# Patient Record
Sex: Female | Born: 1955 | Race: White | Hispanic: No | Marital: Married | Smoking: Former smoker
Health system: Southern US, Community
[De-identification: ages and names within clinical notes are randomized; demographics above are authoritative.]

## PROBLEM LIST (undated history)

## (undated) DIAGNOSIS — K219 Gastro-esophageal reflux disease without esophagitis: Secondary | ICD-10-CM

## (undated) DIAGNOSIS — J449 Chronic obstructive pulmonary disease, unspecified: Secondary | ICD-10-CM

## (undated) DIAGNOSIS — C50919 Malignant neoplasm of unspecified site of unspecified female breast: Secondary | ICD-10-CM

## (undated) DIAGNOSIS — R569 Unspecified convulsions: Secondary | ICD-10-CM

## (undated) DIAGNOSIS — D369 Benign neoplasm, unspecified site: Secondary | ICD-10-CM

## (undated) DIAGNOSIS — I6523 Occlusion and stenosis of bilateral carotid arteries: Secondary | ICD-10-CM

## (undated) DIAGNOSIS — Z79899 Other long term (current) drug therapy: Secondary | ICD-10-CM

## (undated) DIAGNOSIS — M8580 Other specified disorders of bone density and structure, unspecified site: Secondary | ICD-10-CM

## (undated) DIAGNOSIS — I2510 Atherosclerotic heart disease of native coronary artery without angina pectoris: Secondary | ICD-10-CM

## (undated) DIAGNOSIS — F32A Depression, unspecified: Secondary | ICD-10-CM

## (undated) DIAGNOSIS — R0609 Other forms of dyspnea: Secondary | ICD-10-CM

## (undated) DIAGNOSIS — I10 Essential (primary) hypertension: Secondary | ICD-10-CM

## (undated) DIAGNOSIS — R911 Solitary pulmonary nodule: Secondary | ICD-10-CM

## (undated) DIAGNOSIS — Z923 Personal history of irradiation: Secondary | ICD-10-CM

## (undated) DIAGNOSIS — R0600 Dyspnea, unspecified: Secondary | ICD-10-CM

## (undated) DIAGNOSIS — G4733 Obstructive sleep apnea (adult) (pediatric): Secondary | ICD-10-CM

## (undated) DIAGNOSIS — E785 Hyperlipidemia, unspecified: Secondary | ICD-10-CM

## (undated) DIAGNOSIS — J189 Pneumonia, unspecified organism: Secondary | ICD-10-CM

## (undated) DIAGNOSIS — F419 Anxiety disorder, unspecified: Secondary | ICD-10-CM

## (undated) DIAGNOSIS — R519 Headache, unspecified: Secondary | ICD-10-CM

## (undated) DIAGNOSIS — M1990 Unspecified osteoarthritis, unspecified site: Secondary | ICD-10-CM

## (undated) DIAGNOSIS — K760 Fatty (change of) liver, not elsewhere classified: Secondary | ICD-10-CM

## (undated) DIAGNOSIS — G4730 Sleep apnea, unspecified: Secondary | ICD-10-CM

## (undated) DIAGNOSIS — J302 Other seasonal allergic rhinitis: Secondary | ICD-10-CM

## (undated) DIAGNOSIS — M069 Rheumatoid arthritis, unspecified: Secondary | ICD-10-CM

## (undated) DIAGNOSIS — R51 Headache: Secondary | ICD-10-CM

## (undated) DIAGNOSIS — Z7960 Long term (current) use of unspecified immunomodulators and immunosuppressants: Secondary | ICD-10-CM

## (undated) DIAGNOSIS — I219 Acute myocardial infarction, unspecified: Secondary | ICD-10-CM

## (undated) DIAGNOSIS — C801 Malignant (primary) neoplasm, unspecified: Secondary | ICD-10-CM

## (undated) DIAGNOSIS — I38 Endocarditis, valve unspecified: Secondary | ICD-10-CM

## (undated) DIAGNOSIS — M47812 Spondylosis without myelopathy or radiculopathy, cervical region: Secondary | ICD-10-CM

## (undated) DIAGNOSIS — I700 Atherosclerosis of aorta: Secondary | ICD-10-CM

## (undated) HISTORY — PX: APPENDECTOMY: SHX54

## (undated) HISTORY — PX: ROTATOR CUFF REPAIR: SHX139

## (undated) HISTORY — PX: TOE SURGERY: SHX1073

## (undated) HISTORY — PX: ABDOMINAL HYSTERECTOMY: SHX81

## (undated) HISTORY — PX: LOBECTOMY: SHX5089

## (undated) HISTORY — PX: EYE SURGERY: SHX253

## (undated) HISTORY — PX: CARDIAC CATHETERIZATION: SHX172

## (undated) HISTORY — PX: LASIK: SHX215

## (undated) HISTORY — PX: CORONARY ANGIOPLASTY WITH STENT PLACEMENT: SHX49

## (undated) HISTORY — PX: BACK SURGERY: SHX140

---

## 2004-11-30 ENCOUNTER — Ambulatory Visit: Payer: Self-pay

## 2004-12-22 ENCOUNTER — Ambulatory Visit: Payer: Self-pay | Admitting: Internal Medicine

## 2005-02-25 ENCOUNTER — Ambulatory Visit: Payer: Self-pay | Admitting: Unknown Physician Specialty

## 2005-04-01 ENCOUNTER — Emergency Department: Payer: Self-pay | Admitting: Emergency Medicine

## 2006-04-06 ENCOUNTER — Ambulatory Visit: Payer: Self-pay | Admitting: Internal Medicine

## 2007-05-01 ENCOUNTER — Ambulatory Visit: Payer: Self-pay | Admitting: Orthopaedic Surgery

## 2007-06-26 ENCOUNTER — Ambulatory Visit: Payer: Self-pay | Admitting: Anesthesiology

## 2007-06-28 ENCOUNTER — Ambulatory Visit: Payer: Self-pay | Admitting: Internal Medicine

## 2007-07-25 ENCOUNTER — Ambulatory Visit: Payer: Self-pay | Admitting: Anesthesiology

## 2007-08-27 ENCOUNTER — Ambulatory Visit: Payer: Self-pay | Admitting: Anesthesiology

## 2008-01-30 ENCOUNTER — Ambulatory Visit: Payer: Self-pay | Admitting: Internal Medicine

## 2008-09-02 ENCOUNTER — Ambulatory Visit: Payer: Self-pay | Admitting: Internal Medicine

## 2008-12-23 ENCOUNTER — Ambulatory Visit: Payer: Self-pay | Admitting: Internal Medicine

## 2008-12-31 ENCOUNTER — Ambulatory Visit: Payer: Self-pay | Admitting: Internal Medicine

## 2009-01-19 ENCOUNTER — Ambulatory Visit: Payer: Self-pay | Admitting: Unknown Physician Specialty

## 2009-01-28 ENCOUNTER — Ambulatory Visit: Payer: Self-pay | Admitting: Internal Medicine

## 2009-09-03 ENCOUNTER — Ambulatory Visit: Payer: Self-pay | Admitting: Internal Medicine

## 2009-10-02 ENCOUNTER — Ambulatory Visit: Payer: Self-pay | Admitting: Unknown Physician Specialty

## 2009-10-16 ENCOUNTER — Ambulatory Visit: Payer: Self-pay | Admitting: Orthopedic Surgery

## 2009-10-16 ENCOUNTER — Inpatient Hospital Stay: Payer: Self-pay | Admitting: Internal Medicine

## 2009-10-16 DIAGNOSIS — I2510 Atherosclerotic heart disease of native coronary artery without angina pectoris: Secondary | ICD-10-CM

## 2009-10-16 DIAGNOSIS — I2119 ST elevation (STEMI) myocardial infarction involving other coronary artery of inferior wall: Secondary | ICD-10-CM

## 2009-10-16 HISTORY — DX: ST elevation (STEMI) myocardial infarction involving other coronary artery of inferior wall: I21.19

## 2009-10-16 HISTORY — DX: Atherosclerotic heart disease of native coronary artery without angina pectoris: I25.10

## 2009-10-16 HISTORY — PX: CORONARY ANGIOPLASTY WITH STENT PLACEMENT: SHX49

## 2009-12-07 ENCOUNTER — Encounter: Payer: Self-pay | Admitting: Internal Medicine

## 2009-12-11 ENCOUNTER — Encounter: Payer: Self-pay | Admitting: Internal Medicine

## 2010-01-10 ENCOUNTER — Encounter: Payer: Self-pay | Admitting: Internal Medicine

## 2010-03-24 ENCOUNTER — Ambulatory Visit: Payer: Self-pay | Admitting: Orthopedic Surgery

## 2010-04-02 ENCOUNTER — Ambulatory Visit: Payer: Self-pay | Admitting: Orthopedic Surgery

## 2010-10-07 ENCOUNTER — Ambulatory Visit: Payer: Self-pay | Admitting: Internal Medicine

## 2011-07-05 ENCOUNTER — Ambulatory Visit: Payer: Self-pay | Admitting: Physician Assistant

## 2011-08-15 ENCOUNTER — Ambulatory Visit: Payer: Self-pay | Admitting: Internal Medicine

## 2011-10-11 ENCOUNTER — Ambulatory Visit: Payer: Self-pay | Admitting: Internal Medicine

## 2012-07-18 ENCOUNTER — Ambulatory Visit: Payer: Self-pay | Admitting: Podiatry

## 2012-09-12 DIAGNOSIS — R569 Unspecified convulsions: Secondary | ICD-10-CM

## 2012-09-12 HISTORY — DX: Unspecified convulsions: R56.9

## 2012-10-11 ENCOUNTER — Ambulatory Visit: Payer: Self-pay | Admitting: Internal Medicine

## 2012-11-27 ENCOUNTER — Ambulatory Visit: Payer: Self-pay | Admitting: Physical Medicine and Rehabilitation

## 2013-06-12 ENCOUNTER — Ambulatory Visit: Payer: Self-pay | Admitting: Family Medicine

## 2013-06-25 ENCOUNTER — Ambulatory Visit: Payer: Self-pay | Admitting: Internal Medicine

## 2013-07-17 ENCOUNTER — Ambulatory Visit: Payer: Self-pay | Admitting: Urology

## 2013-10-17 ENCOUNTER — Ambulatory Visit: Payer: Self-pay

## 2013-10-23 ENCOUNTER — Ambulatory Visit: Payer: Self-pay

## 2014-01-03 ENCOUNTER — Other Ambulatory Visit: Payer: Self-pay | Admitting: Neurosurgery

## 2014-01-06 ENCOUNTER — Encounter (HOSPITAL_COMMUNITY): Payer: Self-pay | Admitting: Pharmacy Technician

## 2014-01-09 NOTE — Pre-Procedure Instructions (Signed)
Michelle Moore  01/09/2014   Your procedure is scheduled on:  Monday, May 11th   Report to [facility] Admitting at  8:15 AM.   Call this number if you have problems the morning of surgery: [phone]   Remember:   Do not eat food or drink liquids after midnight Sunday.   Take these medicines the morning of surgery with A SIP OF WATER: Zyrtec, Omeprazole.  Please use your Advair .   Do not wear jewelry, make-up or nail polish.  Do not wear lotions, powders, or perfumes. You may wear deodorant.  Do not shave 48 hours prior to surgery.    Do not bring valuables to the hospital.  [facility] is not responsible for any belongings or valuables.               Contacts, dentures or bridgework may not be worn into surgery.  Leave suitcase in the car. After surgery it may be brought to your room.  For patients admitted to the hospital, discharge time is determined by your treatment team.             Name and phone number of your driver:    [facility]  [place]  ---  SPOUSE   Special Instructions: "Preparing for Surgery" instruction sheet.   Please read over the following fact sheets that you were given: Pain Booklet, MRSA Information and Surgical Site Infection Prevention

## 2014-01-10 ENCOUNTER — Encounter (HOSPITAL_COMMUNITY): Payer: Self-pay

## 2014-01-10 ENCOUNTER — Encounter (HOSPITAL_COMMUNITY)
Admission: RE | Admit: 2014-01-10 | Discharge: 2014-01-10 | Disposition: A | Discharge status: Home / Self Care | Payer: BC Managed Care – PPO | Source: Ambulatory Visit | Attending: Neurosurgery | Admitting: Neurosurgery

## 2014-01-10 ENCOUNTER — Other Ambulatory Visit (HOSPITAL_COMMUNITY): Payer: Self-pay

## 2014-01-10 ENCOUNTER — Encounter (HOSPITAL_COMMUNITY)
Admission: RE | Admit: 2014-01-10 | Discharge: 2014-01-10 | Disposition: A | Discharge status: Home / Self Care | Payer: BC Managed Care – PPO | Source: Ambulatory Visit | Attending: Anesthesiology | Admitting: Anesthesiology

## 2014-01-10 DIAGNOSIS — Z01818 Encounter for other preprocedural examination: Secondary | ICD-10-CM | POA: Insufficient documentation

## 2014-01-10 DIAGNOSIS — Z01812 Encounter for preprocedural laboratory examination: Secondary | ICD-10-CM | POA: Insufficient documentation

## 2014-01-10 DIAGNOSIS — Z01810 Encounter for preprocedural cardiovascular examination: Secondary | ICD-10-CM | POA: Insufficient documentation

## 2014-01-10 HISTORY — DX: Gastro-esophageal reflux disease without esophagitis: K21.9

## 2014-01-10 HISTORY — DX: Acute myocardial infarction, unspecified: I21.9

## 2014-01-10 HISTORY — DX: Chronic obstructive pulmonary disease, unspecified: J44.9

## 2014-01-10 HISTORY — DX: Headache: R51

## 2014-01-10 HISTORY — DX: Sleep apnea, unspecified: G47.30

## 2014-01-10 HISTORY — DX: Unspecified osteoarthritis, unspecified site: M19.90

## 2014-01-10 LAB — SURGICAL PCR SCREEN
MRSA, PCR: NEGATIVE
STAPHYLOCOCCUS AUREUS: POSITIVE — AB

## 2014-01-10 LAB — CBC
HCT: 46.6 % — ABNORMAL HIGH (ref 36.0–46.0)
Hemoglobin: 15.6 g/dL — ABNORMAL HIGH (ref 12.0–15.0)
MCH: 31 pg (ref 26.0–34.0)
MCHC: 33.5 g/dL (ref 30.0–36.0)
MCV: 92.6 fL (ref 78.0–100.0)
Platelets: 232 10*3/uL (ref 150–400)
RBC: 5.03 MIL/uL (ref 3.87–5.11)
RDW: 14.6 % (ref 11.5–15.5)
WBC: 11.4 10*3/uL — ABNORMAL HIGH (ref 4.0–10.5)

## 2014-01-10 LAB — BASIC METABOLIC PANEL
BUN: 17 mg/dL (ref 6–23)
CALCIUM: 9 mg/dL (ref 8.4–10.5)
CO2: 25 mEq/L (ref 19–32)
Chloride: 102 mEq/L (ref 96–112)
Creatinine, Ser: 0.64 mg/dL (ref 0.50–1.10)
GFR calc non Af Amer: >90 mL/min (ref >90)
Glucose, Bld: 100 mg/dL — ABNORMAL HIGH (ref 70–99)
POTASSIUM: 4.1 meq/L (ref 3.7–5.3)
Sodium: 140 mEq/L (ref 137–147)

## 2014-01-10 NOTE — Progress Notes (Signed)
Patient had 'MI' in 2011--had cardiac cath @ [facility] performed by Dr. Marykay Lex placed.  She does see him every 6 mths, and is currently symptom/pain free.  Had Stress test @ [facility] in 08/2012 (requested those records.) Her PCP is Suzan Garibaldi @ [facility]--will get a copy of her sleep study results.  Does use the machine @ night and believes the setting is @ 2.5??  DA

## 2014-01-19 MED ORDER — CEFAZOLIN SODIUM-DEXTROSE 2-3 GM-% IV SOLR
2.0000 g | INTRAVENOUS | Status: AC
  Administered 2014-01-20: 2 g via INTRAVENOUS
  Filled 2014-01-19: qty 50

## 2014-01-20 ENCOUNTER — Ambulatory Visit (HOSPITAL_COMMUNITY): Payer: BC Managed Care – PPO

## 2014-01-20 ENCOUNTER — Ambulatory Visit (HOSPITAL_COMMUNITY): Payer: BC Managed Care – PPO | Admitting: Anesthesiology

## 2014-01-20 ENCOUNTER — Ambulatory Visit (HOSPITAL_COMMUNITY)
Admission: RE | Admit: 2014-01-20 | Discharge: 2014-01-21 | Disposition: A | Discharge status: Home / Self Care | Payer: BC Managed Care – PPO | Source: Ambulatory Visit | Attending: Neurosurgery | Admitting: Neurosurgery

## 2014-01-20 ENCOUNTER — Encounter (HOSPITAL_COMMUNITY): Payer: BC Managed Care – PPO | Admitting: Anesthesiology

## 2014-01-20 ENCOUNTER — Encounter (HOSPITAL_COMMUNITY)
Admission: RE | Disposition: A | Discharge status: Home / Self Care | Payer: Self-pay | Source: Ambulatory Visit | Attending: Neurosurgery

## 2014-01-20 ENCOUNTER — Encounter (HOSPITAL_COMMUNITY): Payer: Self-pay | Admitting: Anesthesiology

## 2014-01-20 DIAGNOSIS — M4722 Other spondylosis with radiculopathy, cervical region: Secondary | ICD-10-CM

## 2014-01-20 DIAGNOSIS — M5030 Other cervical disc degeneration, unspecified cervical region: Secondary | ICD-10-CM | POA: Insufficient documentation

## 2014-01-20 DIAGNOSIS — G4730 Sleep apnea, unspecified: Secondary | ICD-10-CM | POA: Insufficient documentation

## 2014-01-20 DIAGNOSIS — M47812 Spondylosis without myelopathy or radiculopathy, cervical region: Secondary | ICD-10-CM | POA: Insufficient documentation

## 2014-01-20 DIAGNOSIS — K219 Gastro-esophageal reflux disease without esophagitis: Secondary | ICD-10-CM | POA: Insufficient documentation

## 2014-01-20 DIAGNOSIS — F17200 Nicotine dependence, unspecified, uncomplicated: Secondary | ICD-10-CM | POA: Insufficient documentation

## 2014-01-20 DIAGNOSIS — M129 Arthropathy, unspecified: Secondary | ICD-10-CM | POA: Insufficient documentation

## 2014-01-20 DIAGNOSIS — J449 Chronic obstructive pulmonary disease, unspecified: Secondary | ICD-10-CM | POA: Insufficient documentation

## 2014-01-20 DIAGNOSIS — I252 Old myocardial infarction: Secondary | ICD-10-CM | POA: Insufficient documentation

## 2014-01-20 DIAGNOSIS — J4489 Other specified chronic obstructive pulmonary disease: Secondary | ICD-10-CM | POA: Insufficient documentation

## 2014-01-20 DIAGNOSIS — Z7982 Long term (current) use of aspirin: Secondary | ICD-10-CM | POA: Insufficient documentation

## 2014-01-20 HISTORY — PX: ANTERIOR CERVICAL DECOMP/DISCECTOMY FUSION: SHX1161

## 2014-01-20 SURGERY — ANTERIOR CERVICAL DECOMPRESSION/DISCECTOMY FUSION 2 LEVELS
Anesthesia: General

## 2014-01-20 MED ORDER — GABAPENTIN 300 MG PO CAPS
300.0000 mg | ORAL_CAPSULE | Freq: Every evening | ORAL | Status: DC | PRN
  Filled 2014-01-20: qty 1

## 2014-01-20 MED ORDER — MENTHOL 3 MG MT LOZG
1.0000 | LOZENGE | OROMUCOSAL | Status: DC | PRN
  Administered 2014-01-20: 3 mg via ORAL
  Filled 2014-01-20: qty 9

## 2014-01-20 MED ORDER — GLYCOPYRROLATE 0.2 MG/ML IJ SOLN
INTRAMUSCULAR | Status: AC
  Filled 2014-01-20: qty 3

## 2014-01-20 MED ORDER — LACTATED RINGERS IV SOLN
INTRAVENOUS | Status: DC | PRN
  Administered 2014-01-20 (×2): via INTRAVENOUS

## 2014-01-20 MED ORDER — ONDANSETRON HCL 4 MG/2ML IJ SOLN: 4.0000 mg | INTRAMUSCULAR | Status: DC | PRN

## 2014-01-20 MED ORDER — LORATADINE 10 MG PO TABS
10.0000 mg | ORAL_TABLET | Freq: Every day | ORAL | Status: DC
Start: 2014-01-21 — End: 2014-01-21
  Filled 2014-01-20: qty 1

## 2014-01-20 MED ORDER — ONDANSETRON HCL 4 MG/2ML IJ SOLN
INTRAMUSCULAR | Status: DC | PRN
  Administered 2014-01-20: 4 mg via INTRAVENOUS

## 2014-01-20 MED ORDER — PANTOPRAZOLE SODIUM 40 MG PO TBEC: 40.0000 mg | DELAYED_RELEASE_TABLET | Freq: Every day | ORAL | Status: DC

## 2014-01-20 MED ORDER — MIDAZOLAM HCL 2 MG/2ML IJ SOLN
INTRAMUSCULAR | Status: AC
  Filled 2014-01-20: qty 2

## 2014-01-20 MED ORDER — HEMOSTATIC AGENTS (NO CHARGE) OPTIME
TOPICAL | Status: DC | PRN
  Administered 2014-01-20: 1 via TOPICAL

## 2014-01-20 MED ORDER — PHENYLEPHRINE 40 MCG/ML (10ML) SYRINGE FOR IV PUSH (FOR BLOOD PRESSURE SUPPORT)
PREFILLED_SYRINGE | INTRAVENOUS | Status: AC
  Filled 2014-01-20: qty 10

## 2014-01-20 MED ORDER — 0.9 % SODIUM CHLORIDE (POUR BTL) OPTIME
TOPICAL | Status: DC | PRN
  Administered 2014-01-20: 1000 mL

## 2014-01-20 MED ORDER — PROPOFOL 10 MG/ML IV BOLUS
INTRAVENOUS | Status: DC | PRN
  Administered 2014-01-20: 200 mg via INTRAVENOUS

## 2014-01-20 MED ORDER — ACETAMINOPHEN 325 MG PO TABS: 650.0000 mg | ORAL_TABLET | ORAL | Status: DC | PRN

## 2014-01-20 MED ORDER — ACETAMINOPHEN 650 MG RE SUPP: 650.0000 mg | RECTAL | Status: DC | PRN

## 2014-01-20 MED ORDER — PHENOL 1.4 % MT LIQD
1.0000 | OROMUCOSAL | Status: DC | PRN
  Administered 2014-01-20: 1 via OROMUCOSAL
  Filled 2014-01-20: qty 177

## 2014-01-20 MED ORDER — ROCURONIUM BROMIDE 100 MG/10ML IV SOLN
INTRAVENOUS | Status: DC | PRN
  Administered 2014-01-20: 50 mg via INTRAVENOUS

## 2014-01-20 MED ORDER — EPHEDRINE SULFATE 50 MG/ML IJ SOLN
INTRAMUSCULAR | Status: DC | PRN
  Administered 2014-01-20: 10 mg via INTRAVENOUS

## 2014-01-20 MED ORDER — LIDOCAINE HCL (CARDIAC) 20 MG/ML IV SOLN
INTRAVENOUS | Status: AC
  Filled 2014-01-20: qty 5

## 2014-01-20 MED ORDER — MONTELUKAST SODIUM 10 MG PO TABS
10.0000 mg | ORAL_TABLET | Freq: Every day | ORAL | Status: DC
  Filled 2014-01-20: qty 1

## 2014-01-20 MED ORDER — PHENYLEPHRINE HCL 10 MG/ML IJ SOLN
INTRAMUSCULAR | Status: DC | PRN
  Administered 2014-01-20: 80 ug via INTRAVENOUS

## 2014-01-20 MED ORDER — CITALOPRAM HYDROBROMIDE 40 MG PO TABS
40.0000 mg | ORAL_TABLET | Freq: Every day | ORAL | Status: DC
  Administered 2014-01-20: 40 mg via ORAL
  Filled 2014-01-20 (×2): qty 1

## 2014-01-20 MED ORDER — MORPHINE SULFATE 2 MG/ML IJ SOLN
1.0000 mg | INTRAMUSCULAR | Status: DC | PRN
  Administered 2014-01-20: 2 mg via INTRAVENOUS
  Filled 2014-01-20: qty 1

## 2014-01-20 MED ORDER — ROCURONIUM BROMIDE 50 MG/5ML IV SOLN
INTRAVENOUS | Status: AC
  Filled 2014-01-20: qty 1

## 2014-01-20 MED ORDER — FENTANYL CITRATE 0.05 MG/ML IJ SOLN
INTRAMUSCULAR | Status: DC | PRN
  Administered 2014-01-20: 250 ug via INTRAVENOUS

## 2014-01-20 MED ORDER — DIAZEPAM 5 MG PO TABS
5.0000 mg | ORAL_TABLET | Freq: Four times a day (QID) | ORAL | Status: DC | PRN
  Administered 2014-01-20: 5 mg via ORAL
  Filled 2014-01-20: qty 1

## 2014-01-20 MED ORDER — HYDROMORPHONE HCL PF 1 MG/ML IJ SOLN
0.2500 mg | INTRAMUSCULAR | Status: DC | PRN
  Administered 2014-01-20 (×2): 0.5 mg via INTRAVENOUS

## 2014-01-20 MED ORDER — OXYCODONE-ACETAMINOPHEN 5-325 MG PO TABS
1.0000 | ORAL_TABLET | ORAL | Status: DC | PRN
  Administered 2014-01-20 – 2014-01-21 (×2): 2 via ORAL
  Filled 2014-01-20 (×2): qty 2

## 2014-01-20 MED ORDER — MIDAZOLAM HCL 5 MG/5ML IJ SOLN
INTRAMUSCULAR | Status: DC | PRN
  Administered 2014-01-20: 2 mg via INTRAVENOUS

## 2014-01-20 MED ORDER — CEFAZOLIN SODIUM-DEXTROSE 2-3 GM-% IV SOLR
2.0000 g | Freq: Three times a day (TID) | INTRAVENOUS | Status: AC
  Administered 2014-01-20 – 2014-01-21 (×2): 2 g via INTRAVENOUS
  Filled 2014-01-20 (×2): qty 50

## 2014-01-20 MED ORDER — DEXAMETHASONE 4 MG PO TABS
4.0000 mg | ORAL_TABLET | Freq: Four times a day (QID) | ORAL | Status: AC
  Administered 2014-01-20 – 2014-01-21 (×3): 4 mg via ORAL
  Filled 2014-01-20 (×3): qty 1

## 2014-01-20 MED ORDER — LACTATED RINGERS IV SOLN
INTRAVENOUS | Status: DC
  Administered 2014-01-20: 17:00:00 via INTRAVENOUS

## 2014-01-20 MED ORDER — NEOSTIGMINE METHYLSULFATE 10 MG/10ML IV SOLN
INTRAVENOUS | Status: AC
  Filled 2014-01-20: qty 1

## 2014-01-20 MED ORDER — HYDROCODONE-ACETAMINOPHEN 5-325 MG PO TABS: 1.0000 | ORAL_TABLET | ORAL | Status: DC | PRN

## 2014-01-20 MED ORDER — EPHEDRINE SULFATE 50 MG/ML IJ SOLN
INTRAMUSCULAR | Status: AC
  Filled 2014-01-20: qty 1

## 2014-01-20 MED ORDER — THROMBIN 5000 UNITS EX SOLR
CUTANEOUS | Status: DC | PRN
  Administered 2014-01-20 (×2): 5000 [IU] via TOPICAL

## 2014-01-20 MED ORDER — HYDROMORPHONE HCL PF 1 MG/ML IJ SOLN
INTRAMUSCULAR | Status: AC
  Administered 2014-01-20: 0.5 mg via INTRAVENOUS
  Filled 2014-01-20: qty 1

## 2014-01-20 MED ORDER — LIDOCAINE HCL (CARDIAC) 20 MG/ML IV SOLN
INTRAVENOUS | Status: DC | PRN
  Administered 2014-01-20: 50 mg via INTRAVENOUS

## 2014-01-20 MED ORDER — MOMETASONE FURO-FORMOTEROL FUM 100-5 MCG/ACT IN AERO
2.0000 | INHALATION_SPRAY | Freq: Two times a day (BID) | RESPIRATORY_TRACT | Status: DC
  Administered 2014-01-20 – 2014-01-21 (×2): 2 via RESPIRATORY_TRACT
  Filled 2014-01-20: qty 8.8

## 2014-01-20 MED ORDER — BACITRACIN ZINC 500 UNIT/GM EX OINT
TOPICAL_OINTMENT | CUTANEOUS | Status: DC | PRN
  Administered 2014-01-20: 1 via TOPICAL

## 2014-01-20 MED ORDER — SODIUM CHLORIDE 0.9 % IR SOLN
Status: DC | PRN
  Administered 2014-01-20: 12:00:00

## 2014-01-20 MED ORDER — NEOSTIGMINE METHYLSULFATE 10 MG/10ML IV SOLN
INTRAVENOUS | Status: DC | PRN
  Administered 2014-01-20: 4 mg via INTRAVENOUS

## 2014-01-20 MED ORDER — DEXAMETHASONE SODIUM PHOSPHATE 4 MG/ML IJ SOLN: 4.0000 mg | Freq: Four times a day (QID) | INTRAMUSCULAR | Status: AC

## 2014-01-20 MED ORDER — FENTANYL CITRATE 0.05 MG/ML IJ SOLN
INTRAMUSCULAR | Status: AC
  Filled 2014-01-20: qty 5

## 2014-01-20 MED ORDER — DOCUSATE SODIUM 100 MG PO CAPS
100.0000 mg | ORAL_CAPSULE | Freq: Two times a day (BID) | ORAL | Status: DC
  Administered 2014-01-20: 100 mg via ORAL
  Filled 2014-01-20 (×3): qty 1

## 2014-01-20 MED ORDER — PROPOFOL 10 MG/ML IV BOLUS
INTRAVENOUS | Status: AC
Start: 2014-01-20 — End: 2014-01-20
  Filled 2014-01-20: qty 20

## 2014-01-20 MED ORDER — BUPIVACAINE-EPINEPHRINE (PF) 0.5% -1:200000 IJ SOLN
INTRAMUSCULAR | Status: DC | PRN
  Administered 2014-01-20: 10 mL

## 2014-01-20 MED ORDER — ALUM & MAG HYDROXIDE-SIMETH 200-200-20 MG/5ML PO SUSP: 30.0000 mL | Freq: Four times a day (QID) | ORAL | Status: DC | PRN

## 2014-01-20 MED ORDER — LACTATED RINGERS IV SOLN
INTRAVENOUS | Status: DC
  Administered 2014-01-20: 10:00:00 via INTRAVENOUS

## 2014-01-20 MED ORDER — GLYCOPYRROLATE 0.2 MG/ML IJ SOLN
INTRAMUSCULAR | Status: DC | PRN
  Administered 2014-01-20: .7 mg via INTRAVENOUS

## 2014-01-20 MED ORDER — SIMVASTATIN 40 MG PO TABS
40.0000 mg | ORAL_TABLET | Freq: Every day | ORAL | Status: DC
  Administered 2014-01-20: 40 mg via ORAL
  Filled 2014-01-20 (×2): qty 1

## 2014-01-20 SURGICAL SUPPLY — 63 items
BAG DECANTER FOR FLEXI CONT (MISCELLANEOUS) ×2 IMPLANT
BENZOIN TINCTURE PRP APPL 2/3 (GAUZE/BANDAGES/DRESSINGS) ×2 IMPLANT
BIT DRILL NEURO 2X3.1 SFT TUCH (MISCELLANEOUS) ×1 IMPLANT
BLADE 10 SAFETY STRL DISP (BLADE) ×2 IMPLANT
BLADE SURG 15 STRL LF DISP TIS (BLADE) ×1 IMPLANT
BLADE SURG 15 STRL SS (BLADE) ×1
BLADE ULTRA TIP 2M (BLADE) ×2 IMPLANT
BRUSH SCRUB EZ PLAIN DRY (MISCELLANEOUS) ×2 IMPLANT
BUR BARREL STRAIGHT FLUTE 4.0 (BURR) ×2 IMPLANT
BUR MATCHSTICK NEURO 3.0 LAGG (BURR) ×2 IMPLANT
CANISTER SUCT 3000ML (MISCELLANEOUS) ×2 IMPLANT
CONT SPEC 4OZ CLIKSEAL STRL BL (MISCELLANEOUS) ×2 IMPLANT
COVER MAYO STAND STRL (DRAPES) ×2 IMPLANT
DEVICE FUSION VIST S 14X14X6MM (Trauma) ×2 IMPLANT
DRAPE LAPAROTOMY 100X72 PEDS (DRAPES) ×2 IMPLANT
DRAPE MICROSCOPE LEICA (MISCELLANEOUS) IMPLANT
DRAPE POUCH INSTRU U-SHP 10X18 (DRAPES) ×2 IMPLANT
DRAPE SURG 17X23 STRL (DRAPES) ×4 IMPLANT
DRILL NEURO 2X3.1 SOFT TOUCH (MISCELLANEOUS) ×2
ELECT REM PT RETURN 9FT ADLT (ELECTROSURGICAL) ×2
ELECTRODE REM PT RTRN 9FT ADLT (ELECTROSURGICAL) ×1 IMPLANT
GAUZE SPONGE 4X4 16PLY XRAY LF (GAUZE/BANDAGES/DRESSINGS) IMPLANT
GLOVE BIO SURGEON STRL SZ8.5 (GLOVE) ×2 IMPLANT
GLOVE BIOGEL PI IND STRL 7.5 (GLOVE) ×1 IMPLANT
GLOVE BIOGEL PI IND STRL 8 (GLOVE) ×1 IMPLANT
GLOVE BIOGEL PI INDICATOR 7.5 (GLOVE) ×1
GLOVE BIOGEL PI INDICATOR 8 (GLOVE) ×1
GLOVE ECLIPSE 7.5 STRL STRAW (GLOVE) ×4 IMPLANT
GLOVE EXAM NITRILE LRG STRL (GLOVE) IMPLANT
GLOVE EXAM NITRILE MD LF STRL (GLOVE) IMPLANT
GLOVE EXAM NITRILE XL STR (GLOVE) IMPLANT
GLOVE EXAM NITRILE XS STR PU (GLOVE) IMPLANT
GLOVE SS BIOGEL STRL SZ 8 (GLOVE) ×1 IMPLANT
GLOVE SUPERSENSE BIOGEL SZ 8 (GLOVE) ×1
GOWN BRE IMP SLV AUR LG STRL (GOWN DISPOSABLE) IMPLANT
GOWN BRE IMP SLV AUR XL STRL (GOWN DISPOSABLE) IMPLANT
GOWN STRL REUS W/ TWL XL LVL3 (GOWN DISPOSABLE) ×3 IMPLANT
GOWN STRL REUS W/TWL XL LVL3 (GOWN DISPOSABLE) ×3
KIT BASIN OR (CUSTOM PROCEDURE TRAY) ×2 IMPLANT
KIT ROOM TURNOVER OR (KITS) ×2 IMPLANT
MARKER SKIN DUAL TIP RULER LAB (MISCELLANEOUS) ×2 IMPLANT
NEEDLE HYPO 22GX1.5 SAFETY (NEEDLE) ×2 IMPLANT
NEEDLE SPNL 18GX3.5 QUINCKE PK (NEEDLE) ×2 IMPLANT
NS IRRIG 1000ML POUR BTL (IV SOLUTION) ×2 IMPLANT
PACK LAMINECTOMY NEURO (CUSTOM PROCEDURE TRAY) ×2 IMPLANT
PATTIES SURGICAL .5 X.5 (GAUZE/BANDAGES/DRESSINGS) ×2 IMPLANT
PIN DISTRACTION 14MM (PIN) ×4 IMPLANT
PLATE ANT CERV XTEND 2 LV 26 (Plate) ×2 IMPLANT
PUTTY 5ML ACTIFUSE ABX (Putty) ×2 IMPLANT
RUBBERBAND STERILE (MISCELLANEOUS) IMPLANT
SCREW XTD VAR 4.2 SELF TAP 12 (Screw) ×12 IMPLANT
SPONGE GAUZE 4X4 12PLY (GAUZE/BANDAGES/DRESSINGS) ×2 IMPLANT
SPONGE INTESTINAL PEANUT (DISPOSABLE) ×4 IMPLANT
SPONGE SURGIFOAM ABS GEL SZ50 (HEMOSTASIS) ×2 IMPLANT
STRIP CLOSURE SKIN 1/2X4 (GAUZE/BANDAGES/DRESSINGS) ×2 IMPLANT
SUT VIC AB 0 CT1 27 (SUTURE) ×1
SUT VIC AB 0 CT1 27XBRD ANTBC (SUTURE) ×1 IMPLANT
SUT VIC AB 3-0 SH 8-18 (SUTURE) ×2 IMPLANT
SYR 20ML ECCENTRIC (SYRINGE) ×2 IMPLANT
TOWEL OR 17X24 6PK STRL BLUE (TOWEL DISPOSABLE) ×2 IMPLANT
TOWEL OR 17X26 10 PK STRL BLUE (TOWEL DISPOSABLE) ×2 IMPLANT
VISTA S O 14X14X6MM (Trauma) ×4 IMPLANT
WATER STERILE IRR 1000ML POUR (IV SOLUTION) ×2 IMPLANT

## 2014-01-20 NOTE — Progress Notes (Signed)
Patient ID: Michelle Moore, female   DOB: 06/27/1956, 58 y.o.   MRN: [id number] Subjective:  The patient is alert and pleasant. Her neck is appropriately sore.  Objective: Vital signs in last 24 hours: Temp:  [97.6 F (36.4 C)-98.5 F (36.9 C)] 97.6 F (36.4 C) (05/11 1705) Pulse Rate:  [64-86] 65 (05/11 1705) Resp:  [13-27] 18 (05/11 1705) BP: (104-117)/(51-70) 104/67 mmHg (05/11 1705) SpO2:  [93 %-98 %] 93 % (05/11 1705) Weight:  [65.772 kg (145 lb)] 65.772 kg (145 lb) (05/11 0824)  Intake/Output from previous day:   Intake/Output this shift: Total I/O In: 1750 [I.V.:1750] Out: -   Physical exam the patient is alert and pleasant. She is moving all 4 extremities well. Her dressing is clean and dry. There is no evidence of hematoma or shift.  Lab Results: No results found for this basename: WBC, HGB, HCT, PLT,  in the last 72 hours BMET No results found for this basename: NA, K, CL, CO2, GLUCOSE, BUN, CREATININE, CALCIUM,  in the last 72 hours  Studies/Results: Dg Cervical Spine 2-3 Views  01/20/2014   CLINICAL DATA:  ACDF.  Spinal fusion.  EXAM: CERVICAL SPINE - 2-3 VIEW  COMPARISON:  None.  FINDINGS: 2 lateral radiographs in during cervical spine operation are submitted for interpretation. These demonstrate a localization images and subsequent placement of what appears to be a C5 through C7 ACDF device. Expected postsurgical changes in the anterior soft tissues.  IMPRESSION: C5 through C7 ACDF.   Electronically Signed   By: Dereck Ligas M.D.   On: 01/20/2014 14:07    Assessment/Plan: The patient is doing well. She will likely go home tomorrow.  LOS: 0 days     Ophelia Charter 01/20/2014, 5:33 PM

## 2014-01-20 NOTE — Progress Notes (Signed)
Pt set up on CPap for the night at a pressure of 14. Will monitor

## 2014-01-20 NOTE — Anesthesia Preprocedure Evaluation (Signed)
Anesthesia Evaluation   Patient awake    Reviewed: Allergy & Precautions, H&P , NPO status , Patient's Chart, lab work & pertinent test results  Airway       Dental   Pulmonary sleep apnea , COPDCurrent Smoker,          Cardiovascular + Past MI     Neuro/Psych    GI/Hepatic Neg liver ROS, GERD-  ,  Endo/Other  negative endocrine ROS  Renal/GU negative Renal ROS     Musculoskeletal   Abdominal   Peds  Hematology   Anesthesia Other Findings   Reproductive/Obstetrics                           Anesthesia Physical Anesthesia Plan  ASA: III  Anesthesia Plan: General   Post-op Pain Management:    Induction: Intravenous  Airway Management Planned: Oral ETT  Additional Equipment:   Intra-op Plan:   Post-operative Plan: Extubation in OR  Informed Consent:   Plan Discussed with: CRNA and Anesthesiologist  Anesthesia Plan Comments:         Anesthesia Quick Evaluation

## 2014-01-20 NOTE — Progress Notes (Signed)
Orthopedic Tech Progress Note Patient Details:  Michelle Moore [birth year] [id number] Spoke with nurse; patient has aspen collar already. No action needed from Ortho. Patient ID: Michelle Moore, female   DOB: 09/24/1955, 58 y.o.   MRN: [id number]   [name] 01/20/2014, 3:33 PM

## 2014-01-20 NOTE — Anesthesia Procedure Notes (Signed)
Procedure Name: Intubation Date/Time: 01/20/2014 11:20 AM Performed by: Eligha Bridegroom Pre-anesthesia Checklist: Patient identified, Timeout performed, Emergency Drugs available, Suction available and Patient being monitored Patient Re-evaluated:Patient Re-evaluated prior to inductionOxygen Delivery Method: Circle system utilized Preoxygenation: Pre-oxygenation with 100% oxygen Intubation Type: IV induction Ventilation: Mask ventilation without difficulty and Oral airway inserted - appropriate to patient size Laryngoscope Size: Mac and 3 Grade View: Grade III Tube type: Oral Tube size: 7.0 mm Airway Equipment and Method: Stylet and Video-laryngoscopy Placement Confirmation: ETT inserted through vocal cords under direct vision,  breath sounds checked- equal and bilateral and positive ETCO2 Secured at: 22 cm Tube secured with: Tape Dental Injury: Teeth and Oropharynx as per pre-operative assessment  Difficulty Due To: Difficult Airway- due to anterior larynx

## 2014-01-20 NOTE — Transfer of Care (Signed)
Immediate Anesthesia Transfer of Care Note  Patient: Michelle Moore  Procedure(s) Performed: Procedure(s): ANTERIOR CERVICAL DECOMPRESSION/DISCECTOMY FUSION 2 LEVELS cervical five/six six Tarry Kos (N/A)  Patient Location: PACU  Anesthesia Type:General  Level of Consciousness: awake and alert   Airway & Oxygen Therapy: Patient Spontanous Breathing and Patient connected to nasal cannula oxygen  Post-op Assessment: Report given to PACU RN and Post -op Vital signs reviewed and stable  Post vital signs: Reviewed and stable  Complications: No apparent anesthesia complications

## 2014-01-20 NOTE — Plan of Care (Signed)
Problem: Consults Goal: Diagnosis - Spinal Surgery Outcome: Completed/Met Date Met:  01/20/14 Cervical Spine Fusion

## 2014-01-20 NOTE — Op Note (Signed)
Brief history: The patient is a 58 year old white female who has complained of neck and arm pain consistent with a cervical radiculopathy. She has failed medical management and was worked up with a cervical MRI. This demonstrated this degeneration, spondylosis, stenosis, etc. at C5-6 and C6-7. I discussed the various treatment option with the patient including surgery. She has weighed the risks, benefits, and alternatives surgery and decided proceed with a C5-C6 and C6-7 anterior cervical discectomy, fusion, and plating.  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 Earle Gell  Asst.: Dr. Jovita Gamma  Anesthesia: Gen. endotracheal  Estimated blood loss: 100 cc  Drains: None  Complications: None  Description of procedure: The patient was brought to the operating room by the anesthesia team. General endotracheal anesthesia was induced. A roll was placed under the patient's shoulders to keep the neck in the neutral position. The patient's anterior cervical region was then prepared with Betadine scrub and Betadine solution. Sterile drapes were applied.  The area to be incised was then injected with Marcaine with epinephrine solution. I then used a scalpel to make a transverse incision in the patient's left anterior neck. I used the Metzenbaum scissors to divide the platysmal muscle and then to dissect medial to the sternocleidomastoid muscle, jugular vein, and carotid artery. I carefully dissected down towards the anterior cervical spine identifying the  esophagus and retracting it medially. Then using Kitner swabs to clear soft tissue from the anterior cervical spine. We then inserted a bent spinal needle into the upper exposed intervertebral disc space. We then obtained intraoperative radiographs confirm our location.  I then used electrocautery to detach the medial border of the longus colli muscle bilaterally from the C5-6 and C6-7 intervertebral disc spaces. I then inserted the Caspar self-retaining retractor underneath the longus colli muscle bilaterally to provide exposure.  We then incised the intervertebral disc at C6-7. We then performed a partial intervertebral discectomy with a pituitary forceps and the Karlin curettes. I then inserted distraction screws into the vertebral bodies at C6-7. We then distracted the interspace. We then used the high-speed drill to decorticate the vertebral endplates at H0-8, to drill away the remainder of the intervertebral disc, to drill away some posterior spondylosis, and to thin out the posterior longitudinal ligament. I then incised ligament with the arachnoid knife. We then removed the ligament with a Kerrison punches undercutting the vertebral endplates and decompressing the thecal sac. We then performed foraminotomies about the bilateral C7 nerve roots. This completed the decompression at this level.  We then repeated this procedure in an analogous fashion at C5-C6 decompressing the thecal sac and the bilateral C6 nerve roots.  We now turned our to attention to the interbody fusion. We used the trial spacers to determine the appropriate size for the interbody prosthesis. We then pre-filled prosthesis with a combination of local morcellized autograft bone that we obtained during decompression as well as Actifuse bone graft extender. We then inserted the prosthesis into the distracted interspace at C5-6 and C6-7. We then removed the distraction screws. There was a good snug fit of the prosthesis in the  interspace.  Having completed the fusion we now turned attention to  the anterior spinal instrumentation. We used the high-speed drill to drill away some anterior spondylosis at the disc spaces so that the plate lay down flat. We selected the appropriate length titanium anterior cervical plate. We laid it along the anterior aspect of the vertebral bodies from C5-C7. We then drilled 12 mm holes at C5, C6 and C7. We then secured the plate to the vertebral bodies by placing two 12 mm self-tapping screws at C5, C6 and C7. We then obtained intraoperative radiograph. The demonstrating good position of the instrumentation. We therefore secured the screws the plate the locking each cam. This completed the instrumentation.  We then obtained hemostasis using bipolar electrocautery. We irrigated the wound out with bacitracin solution. We then removed the retractor. We inspected the esophagus for any damage. There was none apparent. We then reapproximated patient's platysmal muscle with interrupted 3-0 Vicryl suture. We then reapproximated the subcutaneous tissue with interrupted 3-0 Vicryl suture. The skin was reapproximated with Steri-Strips and benzoin. The wound was then covered with bacitracin ointment. A sterile dressing was applied. The drapes were removed. Patient was subsequently extubated by the anesthesia team and transported to the post anesthesia care unit in stable condition. All sponge instrument and needle counts were reportedly correct at the end of this case.

## 2014-01-20 NOTE — Anesthesia Postprocedure Evaluation (Signed)
  Anesthesia Post-op Note  Patient: Michelle Moore  Procedure(s) Performed: Procedure(s): ANTERIOR CERVICAL DECOMPRESSION/DISCECTOMY FUSION 2 LEVELS cervical five/six six Tarry Kos (N/A)  Patient Location: PACU  Anesthesia Type:General  Level of Consciousness: awake  Airway and Oxygen Therapy: Patient Spontanous Breathing  Post-op Pain: mild  Post-op Assessment: Post-op Vital signs reviewed  Post-op Vital Signs: Reviewed  Last Vitals:  Filed Vitals:   01/20/14 1524  BP: 106/70  Pulse: 71  Temp: 36.9 C  Resp: 18    Complications: No apparent anesthesia complications

## 2014-01-20 NOTE — Progress Notes (Signed)
Subjective:  The patient is alert and pleasant. She is in no apparent distress.  Objective: Vital signs in last 24 hours: Temp:  [97.8 F (36.6 C)] 97.8 F (36.6 C) (05/11 1400) Pulse Rate:  [64-86] 77 (05/11 1415) Resp:  [19-27] 19 (05/11 1415) BP: (113-117)/(51-55) 117/55 mmHg (05/11 1415) SpO2:  [95 %-98 %] 98 % (05/11 1415) Weight:  [65.772 kg (145 lb)] 65.772 kg (145 lb) (05/11 0824)  Intake/Output from previous day:   Intake/Output this shift: Total I/O In: 1750 [I.V.:1750] Out: -   Physical exam the patient is alert and pleasant. She moving all 4 extremities well. Her dressing is clean and dry. There is no evidence of hematoma or shift.  Lab Results: No results found for this basename: WBC, HGB, HCT, PLT,  in the last 72 hours BMET No results found for this basename: NA, K, CL, CO2, GLUCOSE, BUN, CREATININE, CALCIUM,  in the last 72 hours  Studies/Results: Dg Cervical Spine 2-3 Views  01/20/2014   CLINICAL DATA:  ACDF.  Spinal fusion.  EXAM: CERVICAL SPINE - 2-3 VIEW  COMPARISON:  None.  FINDINGS: 2 lateral radiographs in during cervical spine operation are submitted for interpretation. These demonstrate a localization images and subsequent placement of what appears to be a C5 through C7 ACDF device. Expected postsurgical changes in the anterior soft tissues.  IMPRESSION: C5 through C7 ACDF.   Electronically Signed   By: Dereck Ligas M.D.   On: 01/20/2014 14:07    Assessment/Plan: The patient is doing well.  LOS: 0 days     Ophelia Charter 01/20/2014, 2:23 PM

## 2014-01-20 NOTE — H&P (Signed)
Subjective: The patient is a 58 year old white female who has complained of neck and arm pain consistent with a cervical radiculopathy. She has failed medical management and was worked up with a cervical MRI. This demonstrated is disc degeneration and spondylosis at C5-6 and C6-7. I discussed the various treatment option with the patient including surgery. She has weighed the risks, benefits, and alternatives surgery and decided proceed with an anterior cervical discectomy, fusion, and plating.   Past Medical History  Diagnosis Date  . Myocardial infarction     in 2011  no damage  . COPD (chronic obstructive pulmonary disease)   . GERD (gastroesophageal reflux disease)     "sometimes"  . Headache(784.0)     sinus headaches  . Arthritis   . Sleep apnea     Past Surgical History  Procedure Laterality Date  . Rotator cuff repair      left shoulder  . Toe surgery Right     has pin and plate in it  . Abdominal hysterectomy    . Cardiac catheterization      2011  . Eye surgery      lasik    Not on File  History  Substance Use Topics  . Smoking status: Current Every Day Smoker -- 1.00 packs/day for 30 years    Types: Cigarettes  . Smokeless tobacco: Not on file  . Alcohol Use: Yes     Comment: occasional    No family history on file. Prior to Admission medications   Medication Sig Start Date End Date Taking? Authorizing Provider  aspirin EC 81 MG tablet Take 81 mg by mouth at bedtime.   Yes Historical Provider, MD  Aspirin-Acetaminophen-Caffeine (EXCEDRIN EXTRA STRENGTH PO) Take 2 tablets by mouth daily as needed (for arm and neck pain).   Yes Historical Provider, MD  cetirizine (ZYRTEC) 10 MG tablet Take 10 mg by mouth daily.   Yes Historical Provider, MD  citalopram (CELEXA) 40 MG tablet Take 40 mg by mouth at bedtime.   Yes Historical Provider, MD  Fluticasone-Salmeterol (ADVAIR) 250-50 MCG/DOSE AEPB Inhale 1 puff into the lungs 2 (two) times daily.   Yes Historical Provider,  MD  gabapentin (NEURONTIN) 300 MG capsule Take 300 mg by mouth as needed (she usually just takes it at night).   Yes Historical Provider, MD  montelukast (SINGULAIR) 10 MG tablet Take 10 mg by mouth at bedtime.   Yes Historical Provider, MD  omeprazole (PRILOSEC) 20 MG capsule Take 20 mg by mouth daily.   Yes Historical Provider, MD  simvastatin (ZOCOR) 40 MG tablet Take 40 mg by mouth at bedtime.   Yes Historical Provider, MD  traMADol (ULTRAM) 50 MG tablet Take 50 mg by mouth 3 (three) times daily.   Yes Historical Provider, MD     Review of Systems  Positive ROS: As above  All other systems have been reviewed and were otherwise negative with the exception of those mentioned in the HPI and as above.  Objective: Vital signs in last 24 hours: Temp:  [97.8 F (36.6 C)] 97.8 F (36.6 C) (05/11 0825) Pulse Rate:  [64] 64 (05/11 0824) Resp:  [20] 20 (05/11 0824) BP: (114)/(53) 114/53 mmHg (05/11 0824) SpO2:  [95 %] 95 % (05/11 0824) Weight:  [65.772 kg (145 lb)] 65.772 kg (145 lb) (05/11 0824)  General Appearance: Alert, cooperative, no distress, Head: Normocephalic, without obvious abnormality, atraumatic Eyes: PERRL, conjunctiva/corneas clear, EOM's intact,    Ears: Normal  Throat: Normal  Neck: Supple,  symmetrical, trachea midline, no adenopathy; thyroid: No enlargement/tenderness/nodules; no carotid bruit or JVD Back: Symmetric, no curvature, ROM normal, no CVA tenderness Lungs: Clear to auscultation bilaterally, respirations unlabored Heart: Regular rate and rhythm, no murmur, rub or gallop Abdomen: Soft, non-tender,, no masses, no organomegaly Extremities: Extremities normal, atraumatic, no cyanosis or edema Pulses: 2+ and symmetric all extremities Skin: Skin color, texture, turgor normal, no rashes or lesions  NEUROLOGIC:   Mental status: alert and oriented, no aphasia, good attention span, Fund of knowledge/ memory ok Motor Exam - grossly normal Sensory Exam - grossly  normal Reflexes:  Coordination - grossly normal Gait - grossly normal Balance - grossly normal Cranial Nerves: I: smell Not tested  II: visual acuity  OS: Normal  OD: Normal   II: visual fields Full to confrontation  II: pupils Equal, round, reactive to light  III,VII: ptosis None  III,IV,VI: extraocular muscles  Full ROM  V: mastication Normal  V: facial light touch sensation  Normal  V,VII: corneal reflex  Present  VII: facial muscle function - upper  Normal  VII: facial muscle function - lower Normal  VIII: hearing Not tested  IX: soft palate elevation  Normal  IX,X: gag reflex Present  XI: trapezius strength  5/5  XI: sternocleidomastoid strength 5/5  XI: neck flexion strength  5/5  XII: tongue strength  Normal    Data Review Lab Results  Component Value Date   WBC 11.4* 01/10/2014   HGB 15.6* 01/10/2014   HCT 46.6* 01/10/2014   MCV 92.6 01/10/2014   PLT 232 01/10/2014   Lab Results  Component Value Date   NA 140 01/10/2014   K 4.1 01/10/2014   CL 102 01/10/2014   CO2 25 01/10/2014   BUN 17 01/10/2014   CREATININE 0.64 01/10/2014   GLUCOSE 100* 01/10/2014   No results found for this basename: INR, PROTIME    Assessment/Plan: C5-C6 and C6-7 disc degeneration, spondylosis, stenosis, cervicalgia, cervical radiculopathy: I discussed the situation with the patient. I reviewed her MR scan with her and pointed out the abnormalities. We have discussed the various treatment options including surgery. I described the surgical treatment option of a C5-C6 and C6-7 anterior cervical discectomy, fusion, and plating. I've shown her surgical models. We have discussed the risks, benefits, alternatives, and likelihood of achieving our goals with surgery. I have answered all the patient's questions. She has decided to proceed with surgery.   Ophelia Charter 01/20/2014 10:14 AM

## 2014-01-21 ENCOUNTER — Encounter (HOSPITAL_COMMUNITY): Payer: Self-pay | Admitting: Neurosurgery

## 2014-01-21 MED ORDER — DIAZEPAM 5 MG PO TABS: 5.0000 mg | ORAL_TABLET | Freq: Four times a day (QID) | ORAL | Status: DC | PRN

## 2014-01-21 MED ORDER — DSS 100 MG PO CAPS: 100.0000 mg | ORAL_CAPSULE | Freq: Two times a day (BID) | ORAL | Status: DC

## 2014-01-21 MED ORDER — OXYCODONE-ACETAMINOPHEN 10-325 MG PO TABS: 1.0000 | ORAL_TABLET | ORAL | Status: DC | PRN

## 2014-01-21 NOTE — Discharge Summary (Signed)
Physician Discharge Summary  Patient ID: Michelle Moore MRN: [id number] DOB/AGE: 57-Jun-1957 57 y.o.  Admit date: 01/20/2014 Discharge date: 01/21/2014  Admission Diagnoses: C5-6 and C6-7 disc degeneration, spondylosis, stenosis, cervicalgia, cervical radiculopathy  Discharge Diagnoses: The same Active Problems:   Cervical spondylosis with radiculopathy   Discharged Condition: good  Hospital Course: I performed a C5-C6 and C6-7 anterior cervical discectomy, fusion, and plating on the patient on 01/20/2014. The surgery went well.  The patient's postoperative course was unremarkable. On postop day #1 the patient requested discharge to home. She was given oral and written discharge instructions. All her questions were answered.  Consults: None Significant Diagnostic Studies: None Treatments: C5-6 and C6-7 anterior cervical discectomy, fusion, and plating. Discharge Exam: Blood pressure 103/67, pulse 62, temperature 97.6 F (36.4 C), temperature source Oral, resp. rate 20, height 5' (1.524 m), weight 65.772 kg (145 lb), SpO2 93.00%. The patient is alert and pleasant. She looks well. She is in no apparent distress. She is moving all 4 extremities well. Her dressing is clean and dry. There is no evidence of hematoma or shift.  Disposition: Home  Discharge Orders   Future Orders Complete By Expires   Call MD for:  difficulty breathing, headache or visual disturbances  As directed    Call MD for:  extreme fatigue  As directed    Call MD for:  hives  As directed    Call MD for:  persistant dizziness or light-headedness  As directed    Call MD for:  persistant nausea and vomiting  As directed    Call MD for:  redness, tenderness, or signs of infection (pain, swelling, redness, odor or green/yellow discharge around incision site)  As directed    Call MD for:  severe uncontrolled pain  As directed    Call MD for:  temperature >100.4  As directed    Diet - low sodium heart healthy  As directed     Discharge instructions  As directed    Driving Restrictions  As directed    Increase activity slowly  As directed    Lifting restrictions  As directed    May shower / Bathe  As directed    Remove dressing in 48 hours  As directed        Medication List    STOP taking these medications       traMADol 50 MG tablet  Commonly known as:  ULTRAM      TAKE these medications       aspirin EC 81 MG tablet  Take 81 mg by mouth at bedtime.     cetirizine 10 MG tablet  Commonly known as:  ZYRTEC  Take 10 mg by mouth daily.     citalopram 40 MG tablet  Commonly known as:  CELEXA  Take 40 mg by mouth at bedtime.     diazepam 5 MG tablet  Commonly known as:  VALIUM  Take 1 tablet (5 mg total) by mouth every 6 (six) hours as needed for muscle spasms.     DSS 100 MG Caps  Take 100 mg by mouth 2 (two) times daily.     EXCEDRIN EXTRA STRENGTH PO  Take 2 tablets by mouth daily as needed (for arm and neck pain).     Fluticasone-Salmeterol 250-50 MCG/DOSE Aepb  Commonly known as:  ADVAIR  Inhale 1 puff into the lungs 2 (two) times daily.     gabapentin 300 MG capsule  Commonly known as:  NEURONTIN  Take  300 mg by mouth at bedtime as needed (leg pain).     montelukast 10 MG tablet  Commonly known as:  SINGULAIR  Take 10 mg by mouth at bedtime.     omeprazole 20 MG capsule  Commonly known as:  PRILOSEC  Take 20 mg by mouth daily.     oxyCODONE-acetaminophen 10-325 MG per tablet  Commonly known as:  PERCOCET  Take 1 tablet by mouth every 4 (four) hours as needed for pain.     simvastatin 40 MG tablet  Commonly known as:  ZOCOR  Take 40 mg by mouth at bedtime.         Signed: Ophelia Charter 01/21/2014, 7:20 AM

## 2014-03-07 NOTE — OR Nursing (Signed)
Addendum to scope page 

## 2014-05-08 ENCOUNTER — Ambulatory Visit: Payer: Self-pay | Admitting: Internal Medicine

## 2014-07-14 DIAGNOSIS — I6783 Posterior reversible encephalopathy syndrome: Secondary | ICD-10-CM

## 2014-07-14 DIAGNOSIS — G4733 Obstructive sleep apnea (adult) (pediatric): Secondary | ICD-10-CM | POA: Insufficient documentation

## 2014-07-14 DIAGNOSIS — R634 Abnormal weight loss: Secondary | ICD-10-CM | POA: Insufficient documentation

## 2014-07-14 HISTORY — DX: Posterior reversible encephalopathy syndrome: I67.83

## 2014-07-31 ENCOUNTER — Ambulatory Visit: Payer: Self-pay | Admitting: Internal Medicine

## 2014-07-31 HISTORY — PX: CARDIAC CATHETERIZATION: SHX172

## 2014-08-14 DIAGNOSIS — K2100 Gastro-esophageal reflux disease with esophagitis, without bleeding: Secondary | ICD-10-CM | POA: Insufficient documentation

## 2014-08-14 DIAGNOSIS — I6523 Occlusion and stenosis of bilateral carotid arteries: Secondary | ICD-10-CM | POA: Insufficient documentation

## 2014-11-10 ENCOUNTER — Ambulatory Visit: Payer: Self-pay | Admitting: Internal Medicine

## 2015-02-17 ENCOUNTER — Other Ambulatory Visit: Payer: Self-pay | Admitting: Neurosurgery

## 2015-02-17 DIAGNOSIS — R399 Unspecified symptoms and signs involving the genitourinary system: Secondary | ICD-10-CM

## 2015-02-24 ENCOUNTER — Ambulatory Visit
Admission: RE | Admit: 2015-02-24 | Discharge: 2015-02-24 | Disposition: A | Discharge status: Home / Self Care | Payer: BLUE CROSS/BLUE SHIELD | Source: Ambulatory Visit | Attending: Neurosurgery | Admitting: Neurosurgery

## 2015-02-24 DIAGNOSIS — R399 Unspecified symptoms and signs involving the genitourinary system: Secondary | ICD-10-CM

## 2015-02-24 DIAGNOSIS — N289 Disorder of kidney and ureter, unspecified: Secondary | ICD-10-CM | POA: Diagnosis not present

## 2015-08-28 ENCOUNTER — Ambulatory Visit
Admission: RE | Admit: 2015-08-28 | Payer: BLUE CROSS/BLUE SHIELD | Source: Ambulatory Visit | Admitting: Unknown Physician Specialty

## 2015-08-28 ENCOUNTER — Encounter: Admission: RE | Payer: Self-pay | Source: Ambulatory Visit

## 2015-08-28 SURGERY — COLONOSCOPY WITH PROPOFOL
Anesthesia: General

## 2015-12-17 ENCOUNTER — Other Ambulatory Visit: Payer: Self-pay | Admitting: Nurse Practitioner

## 2015-12-17 DIAGNOSIS — Z1231 Encounter for screening mammogram for malignant neoplasm of breast: Secondary | ICD-10-CM

## 2015-12-25 ENCOUNTER — Ambulatory Visit
Admission: RE | Admit: 2015-12-25 | Discharge: 2015-12-25 | Disposition: A | Discharge status: Home / Self Care | Payer: BLUE CROSS/BLUE SHIELD | Source: Ambulatory Visit | Attending: Nurse Practitioner | Admitting: Nurse Practitioner

## 2015-12-25 DIAGNOSIS — Z1231 Encounter for screening mammogram for malignant neoplasm of breast: Secondary | ICD-10-CM | POA: Insufficient documentation

## 2016-08-10 DIAGNOSIS — E782 Mixed hyperlipidemia: Secondary | ICD-10-CM | POA: Diagnosis not present

## 2016-08-10 DIAGNOSIS — I2510 Atherosclerotic heart disease of native coronary artery without angina pectoris: Secondary | ICD-10-CM | POA: Diagnosis not present

## 2016-08-10 DIAGNOSIS — Z720 Tobacco use: Secondary | ICD-10-CM | POA: Diagnosis not present

## 2016-08-10 DIAGNOSIS — I6523 Occlusion and stenosis of bilateral carotid arteries: Secondary | ICD-10-CM | POA: Diagnosis not present

## 2016-08-10 DIAGNOSIS — M0579 Rheumatoid arthritis with rheumatoid factor of multiple sites without organ or systems involvement: Secondary | ICD-10-CM | POA: Diagnosis not present

## 2016-10-05 DIAGNOSIS — I2510 Atherosclerotic heart disease of native coronary artery without angina pectoris: Secondary | ICD-10-CM | POA: Diagnosis not present

## 2016-10-05 DIAGNOSIS — I6523 Occlusion and stenosis of bilateral carotid arteries: Secondary | ICD-10-CM | POA: Diagnosis not present

## 2016-10-27 DIAGNOSIS — I2510 Atherosclerotic heart disease of native coronary artery without angina pectoris: Secondary | ICD-10-CM | POA: Diagnosis not present

## 2016-10-27 DIAGNOSIS — E782 Mixed hyperlipidemia: Secondary | ICD-10-CM | POA: Diagnosis not present

## 2016-10-27 DIAGNOSIS — I6523 Occlusion and stenosis of bilateral carotid arteries: Secondary | ICD-10-CM | POA: Diagnosis not present

## 2016-10-27 DIAGNOSIS — M0579 Rheumatoid arthritis with rheumatoid factor of multiple sites without organ or systems involvement: Secondary | ICD-10-CM | POA: Diagnosis not present

## 2016-10-31 DIAGNOSIS — Z0001 Encounter for general adult medical examination with abnormal findings: Secondary | ICD-10-CM | POA: Diagnosis not present

## 2016-10-31 DIAGNOSIS — J449 Chronic obstructive pulmonary disease, unspecified: Secondary | ICD-10-CM | POA: Diagnosis not present

## 2016-10-31 DIAGNOSIS — I10 Essential (primary) hypertension: Secondary | ICD-10-CM | POA: Diagnosis not present

## 2016-10-31 DIAGNOSIS — I6523 Occlusion and stenosis of bilateral carotid arteries: Secondary | ICD-10-CM | POA: Diagnosis not present

## 2016-10-31 DIAGNOSIS — I2510 Atherosclerotic heart disease of native coronary artery without angina pectoris: Secondary | ICD-10-CM | POA: Diagnosis not present

## 2016-10-31 DIAGNOSIS — E782 Mixed hyperlipidemia: Secondary | ICD-10-CM | POA: Diagnosis not present

## 2016-10-31 DIAGNOSIS — R69 Illness, unspecified: Secondary | ICD-10-CM | POA: Diagnosis not present

## 2016-11-01 ENCOUNTER — Other Ambulatory Visit: Payer: Self-pay | Admitting: Nurse Practitioner

## 2016-11-01 DIAGNOSIS — Z1231 Encounter for screening mammogram for malignant neoplasm of breast: Secondary | ICD-10-CM

## 2016-11-02 DIAGNOSIS — Z79899 Other long term (current) drug therapy: Secondary | ICD-10-CM | POA: Diagnosis not present

## 2016-11-02 DIAGNOSIS — M25511 Pain in right shoulder: Secondary | ICD-10-CM | POA: Diagnosis not present

## 2016-11-02 DIAGNOSIS — G8929 Other chronic pain: Secondary | ICD-10-CM | POA: Diagnosis not present

## 2016-11-02 DIAGNOSIS — M0579 Rheumatoid arthritis with rheumatoid factor of multiple sites without organ or systems involvement: Secondary | ICD-10-CM | POA: Diagnosis not present

## 2016-12-27 ENCOUNTER — Ambulatory Visit
Admission: RE | Admit: 2016-12-27 | Discharge: 2016-12-27 | Disposition: A | Discharge status: Home / Self Care | Payer: Medicare HMO | Source: Ambulatory Visit | Attending: Nurse Practitioner | Admitting: Nurse Practitioner

## 2016-12-27 DIAGNOSIS — Z1231 Encounter for screening mammogram for malignant neoplasm of breast: Secondary | ICD-10-CM | POA: Insufficient documentation

## 2017-01-05 DIAGNOSIS — R682 Dry mouth, unspecified: Secondary | ICD-10-CM | POA: Diagnosis not present

## 2017-01-05 DIAGNOSIS — H93299 Other abnormal auditory perceptions, unspecified ear: Secondary | ICD-10-CM | POA: Diagnosis not present

## 2017-01-25 DIAGNOSIS — Z79899 Other long term (current) drug therapy: Secondary | ICD-10-CM | POA: Diagnosis not present

## 2017-01-25 DIAGNOSIS — M0579 Rheumatoid arthritis with rheumatoid factor of multiple sites without organ or systems involvement: Secondary | ICD-10-CM | POA: Diagnosis not present

## 2017-02-02 DIAGNOSIS — J309 Allergic rhinitis, unspecified: Secondary | ICD-10-CM | POA: Diagnosis not present

## 2017-02-02 DIAGNOSIS — J019 Acute sinusitis, unspecified: Secondary | ICD-10-CM | POA: Diagnosis not present

## 2017-02-02 DIAGNOSIS — R69 Illness, unspecified: Secondary | ICD-10-CM | POA: Diagnosis not present

## 2017-02-02 NOTE — Telephone Encounter (Signed)
Error

## 2017-02-22 DIAGNOSIS — M5412 Radiculopathy, cervical region: Secondary | ICD-10-CM | POA: Diagnosis not present

## 2017-02-22 DIAGNOSIS — M5416 Radiculopathy, lumbar region: Secondary | ICD-10-CM | POA: Diagnosis not present

## 2017-02-22 DIAGNOSIS — M5030 Other cervical disc degeneration, unspecified cervical region: Secondary | ICD-10-CM | POA: Diagnosis not present

## 2017-02-22 DIAGNOSIS — M5136 Other intervertebral disc degeneration, lumbar region: Secondary | ICD-10-CM | POA: Diagnosis not present

## 2017-04-27 DIAGNOSIS — Z79899 Other long term (current) drug therapy: Secondary | ICD-10-CM | POA: Diagnosis not present

## 2017-04-27 DIAGNOSIS — I2510 Atherosclerotic heart disease of native coronary artery without angina pectoris: Secondary | ICD-10-CM | POA: Diagnosis not present

## 2017-04-27 DIAGNOSIS — M0579 Rheumatoid arthritis with rheumatoid factor of multiple sites without organ or systems involvement: Secondary | ICD-10-CM | POA: Diagnosis not present

## 2017-04-27 DIAGNOSIS — J449 Chronic obstructive pulmonary disease, unspecified: Secondary | ICD-10-CM | POA: Diagnosis not present

## 2017-04-27 DIAGNOSIS — R69 Illness, unspecified: Secondary | ICD-10-CM | POA: Diagnosis not present

## 2017-04-27 DIAGNOSIS — G4733 Obstructive sleep apnea (adult) (pediatric): Secondary | ICD-10-CM | POA: Diagnosis not present

## 2017-04-27 DIAGNOSIS — E782 Mixed hyperlipidemia: Secondary | ICD-10-CM | POA: Diagnosis not present

## 2017-04-27 DIAGNOSIS — I6523 Occlusion and stenosis of bilateral carotid arteries: Secondary | ICD-10-CM | POA: Diagnosis not present

## 2017-04-27 DIAGNOSIS — Z9989 Dependence on other enabling machines and devices: Secondary | ICD-10-CM | POA: Diagnosis not present

## 2017-05-11 DIAGNOSIS — Z79899 Other long term (current) drug therapy: Secondary | ICD-10-CM | POA: Diagnosis not present

## 2017-05-11 DIAGNOSIS — M150 Primary generalized (osteo)arthritis: Secondary | ICD-10-CM | POA: Diagnosis not present

## 2017-05-11 DIAGNOSIS — M0579 Rheumatoid arthritis with rheumatoid factor of multiple sites without organ or systems involvement: Secondary | ICD-10-CM | POA: Diagnosis not present

## 2017-05-12 DIAGNOSIS — M7541 Impingement syndrome of right shoulder: Secondary | ICD-10-CM | POA: Diagnosis not present

## 2017-05-12 DIAGNOSIS — M25111 Fistula, right shoulder: Secondary | ICD-10-CM | POA: Diagnosis not present

## 2017-05-12 DIAGNOSIS — G8929 Other chronic pain: Secondary | ICD-10-CM | POA: Diagnosis not present

## 2017-05-12 DIAGNOSIS — M7581 Other shoulder lesions, right shoulder: Secondary | ICD-10-CM | POA: Diagnosis not present

## 2017-05-20 DIAGNOSIS — L255 Unspecified contact dermatitis due to plants, except food: Secondary | ICD-10-CM | POA: Diagnosis not present

## 2017-05-20 DIAGNOSIS — L237 Allergic contact dermatitis due to plants, except food: Secondary | ICD-10-CM | POA: Diagnosis not present

## 2017-05-20 DIAGNOSIS — H05011 Cellulitis of right orbit: Secondary | ICD-10-CM | POA: Diagnosis not present

## 2017-06-26 DIAGNOSIS — Z23 Encounter for immunization: Secondary | ICD-10-CM | POA: Diagnosis not present

## 2017-08-16 DIAGNOSIS — Z79899 Other long term (current) drug therapy: Secondary | ICD-10-CM | POA: Diagnosis not present

## 2017-08-16 DIAGNOSIS — M0579 Rheumatoid arthritis with rheumatoid factor of multiple sites without organ or systems involvement: Secondary | ICD-10-CM | POA: Diagnosis not present

## 2017-08-31 ENCOUNTER — Other Ambulatory Visit: Payer: Self-pay | Admitting: Nurse Practitioner

## 2017-08-31 DIAGNOSIS — J019 Acute sinusitis, unspecified: Secondary | ICD-10-CM

## 2017-08-31 MED ORDER — AMOXICILLIN-POT CLAVULANATE 875-125 MG PO TABS: 1.0000 | ORAL_TABLET | Freq: Two times a day (BID) | ORAL | 0 refills | Status: DC

## 2017-08-31 NOTE — Progress Notes (Signed)
augmentin 875mg  twice daily for 10 days sent to pharmacy for sinusitis

## 2017-09-06 ENCOUNTER — Other Ambulatory Visit: Payer: Self-pay | Admitting: Nurse Practitioner

## 2017-09-06 DIAGNOSIS — R05 Cough: Secondary | ICD-10-CM

## 2017-09-06 DIAGNOSIS — J069 Acute upper respiratory infection, unspecified: Secondary | ICD-10-CM

## 2017-09-06 DIAGNOSIS — R059 Cough, unspecified: Secondary | ICD-10-CM

## 2017-09-06 MED ORDER — LEVOFLOXACIN 500 MG PO TABS: 500.0000 mg | ORAL_TABLET | Freq: Every day | ORAL | 0 refills | Status: DC

## 2017-09-06 MED ORDER — HYDROCOD POLST-CPM POLST ER 10-8 MG/5ML PO SUER: 5.0000 mL | Freq: Two times a day (BID) | ORAL | 0 refills | Status: DC

## 2017-09-06 NOTE — Progress Notes (Signed)
Sent in levofloxacin 500mg  daily for 7 days also added tussionex 18mls bid prn cough.

## 2017-09-14 DIAGNOSIS — Z79899 Other long term (current) drug therapy: Secondary | ICD-10-CM | POA: Diagnosis not present

## 2017-09-14 DIAGNOSIS — M542 Cervicalgia: Secondary | ICD-10-CM | POA: Diagnosis not present

## 2017-09-14 DIAGNOSIS — M0579 Rheumatoid arthritis with rheumatoid factor of multiple sites without organ or systems involvement: Secondary | ICD-10-CM | POA: Diagnosis not present

## 2017-09-15 ENCOUNTER — Other Ambulatory Visit: Payer: Self-pay

## 2017-09-15 MED ORDER — MONTELUKAST SODIUM 10 MG PO TABS: 10.0000 mg | ORAL_TABLET | Freq: Every day | ORAL | 3 refills | Status: DC

## 2017-09-27 DIAGNOSIS — G4733 Obstructive sleep apnea (adult) (pediatric): Secondary | ICD-10-CM | POA: Diagnosis not present

## 2017-10-11 DIAGNOSIS — L821 Other seborrheic keratosis: Secondary | ICD-10-CM | POA: Diagnosis not present

## 2017-10-11 DIAGNOSIS — L720 Epidermal cyst: Secondary | ICD-10-CM | POA: Diagnosis not present

## 2017-10-11 DIAGNOSIS — L918 Other hypertrophic disorders of the skin: Secondary | ICD-10-CM | POA: Diagnosis not present

## 2017-10-19 ENCOUNTER — Other Ambulatory Visit: Payer: Self-pay

## 2017-10-19 MED ORDER — FLUTICASONE-SALMETEROL 250-50 MCG/DOSE IN AEPB: 1.0000 | INHALATION_SPRAY | Freq: Two times a day (BID) | RESPIRATORY_TRACT | 5 refills | Status: DC

## 2017-11-03 DIAGNOSIS — M5416 Radiculopathy, lumbar region: Secondary | ICD-10-CM | POA: Diagnosis not present

## 2017-11-03 DIAGNOSIS — M5030 Other cervical disc degeneration, unspecified cervical region: Secondary | ICD-10-CM | POA: Diagnosis not present

## 2017-11-03 DIAGNOSIS — M5412 Radiculopathy, cervical region: Secondary | ICD-10-CM | POA: Diagnosis not present

## 2017-11-03 DIAGNOSIS — M5136 Other intervertebral disc degeneration, lumbar region: Secondary | ICD-10-CM | POA: Diagnosis not present

## 2017-11-06 ENCOUNTER — Other Ambulatory Visit: Payer: Self-pay | Admitting: Physical Medicine and Rehabilitation

## 2017-11-06 DIAGNOSIS — M5416 Radiculopathy, lumbar region: Secondary | ICD-10-CM

## 2017-11-09 ENCOUNTER — Encounter: Payer: Self-pay | Admitting: Nurse Practitioner

## 2017-11-09 ENCOUNTER — Ambulatory Visit: Payer: Medicare HMO | Admitting: Nurse Practitioner

## 2017-11-09 VITALS — BP 151/93 | HR 74 | Temp 96.8°F | Resp 16 | Ht 60.0 in | Wt 147.0 lb

## 2017-11-09 DIAGNOSIS — J019 Acute sinusitis, unspecified: Secondary | ICD-10-CM

## 2017-11-09 DIAGNOSIS — Z1239 Encounter for other screening for malignant neoplasm of breast: Secondary | ICD-10-CM

## 2017-11-09 DIAGNOSIS — I2510 Atherosclerotic heart disease of native coronary artery without angina pectoris: Secondary | ICD-10-CM | POA: Insufficient documentation

## 2017-11-09 DIAGNOSIS — Z1231 Encounter for screening mammogram for malignant neoplasm of breast: Secondary | ICD-10-CM

## 2017-11-09 DIAGNOSIS — Z0001 Encounter for general adult medical examination with abnormal findings: Secondary | ICD-10-CM | POA: Diagnosis not present

## 2017-11-09 DIAGNOSIS — R300 Dysuria: Secondary | ICD-10-CM | POA: Diagnosis not present

## 2017-11-09 DIAGNOSIS — E782 Mixed hyperlipidemia: Secondary | ICD-10-CM | POA: Insufficient documentation

## 2017-11-09 DIAGNOSIS — J3089 Other allergic rhinitis: Secondary | ICD-10-CM | POA: Diagnosis not present

## 2017-11-09 MED ORDER — AMOXICILLIN-POT CLAVULANATE 875-125 MG PO TABS: 1.0000 | ORAL_TABLET | Freq: Two times a day (BID) | ORAL | 0 refills | Status: DC

## 2017-11-09 NOTE — Addendum Note (Signed)
Addended by: Leretha Pol on: 11/09/2017 01:35 PM   Modules accepted: Level of Service

## 2017-11-09 NOTE — Progress Notes (Signed)
[facility] [place], [place] [address]  Internal MEDICINE  Office Visit Note  Patient Name: Michelle Moore  [id number]  [id number]  Date of Service: 11/09/2017  Chief Complaint  Patient presents with  . Sinusitis    going on few weeks   . Cough     Sinusitis  This is a new problem. The current episode started 1 to 4 weeks ago. The problem has been gradually improving since onset. There has been no fever. The pain is moderate. Associated symptoms include chills, congestion, coughing, headaches, sinus pressure and swollen glands. Past treatments include acetaminophen and oral decongestants. The treatment provided mild relief.  Cough  Associated symptoms include chills, headaches, postnasal drip and rhinorrhea. Pertinent negatives include no fever. Her past medical history is significant for environmental allergies.   Pt is here for routine health maintenance examination  Current Medication: Outpatient Encounter Medications as of 11/09/2017  Medication Sig  . ALPRAZolam (XANAX) 0.25 MG tablet Take by mouth.  Marland Kitchen aspirin EC 81 MG tablet Take 81 mg by mouth at bedtime.  . Aspirin-Acetaminophen-Caffeine (EXCEDRIN EXTRA STRENGTH PO) Take 2 tablets by mouth daily as needed (for arm and neck pain).  . cetirizine (ZYRTEC) 10 MG tablet Take 10 mg by mouth daily.  . citalopram (CELEXA) 40 MG tablet Take 40 mg by mouth at bedtime.  . fluticasone (FLONASE) 50 MCG/ACT nasal spray Place 2 sprays into both nostrils daily.  . Fluticasone-Salmeterol (ADVAIR) 250-50 MCG/DOSE AEPB Inhale 1 puff into the lungs 2 (two) times daily.  . folic acid (FOLVITE) 1 MG tablet TAKE TWO TABLETS DAILY AS DIRECTED BY PHYSICIAN  . methotrexate (RHEUMATREX) 2.5 MG tablet TAKE 8 TABLETS WEEKLY AS DIRECTED BY PHYSICIAN  . montelukast (SINGULAIR) 10 MG tablet Take 1 tablet (10 mg total) by mouth at bedtime.  Marland Kitchen omeprazole (PRILOSEC) 40 MG capsule Take 40 mg by mouth daily.  . predniSONE  (DELTASONE) 5 MG tablet Take as directed - 12 day taper  . simvastatin (ZOCOR) 40 MG tablet Take 40 mg by mouth at bedtime.  . traMADol (ULTRAM) 50 MG tablet TAKE 1 TO 2 TABLETS TWICE A DAY AS NEEDED  . vitamin B-12 (CYANOCOBALAMIN) 1000 MCG tablet Take 1,000 mcg by mouth daily.  Marland Kitchen amoxicillin-clavulanate (AUGMENTIN) 875-125 MG tablet Take 1 tablet by mouth 2 (two) times daily.  . [DISCONTINUED] amoxicillin-clavulanate (AUGMENTIN) 875-125 MG tablet Take 1 tablet by mouth 2 (two) times daily.  . [DISCONTINUED] chlorpheniramine-HYDROcodone (TUSSIONEX PENNKINETIC ER) 10-8 MG/5ML SUER Take 5 mLs by mouth 2 (two) times daily.  . [DISCONTINUED] diazepam (VALIUM) 5 MG tablet Take 1 tablet (5 mg total) by mouth every 6 (six) hours as needed for muscle spasms.  . [DISCONTINUED] docusate sodium 100 MG CAPS Take 100 mg by mouth 2 (two) times daily.  . [DISCONTINUED] gabapentin (NEURONTIN) 300 MG capsule Take 300 mg by mouth at bedtime as needed (leg pain).   . [DISCONTINUED] levofloxacin (LEVAQUIN) 500 MG tablet Take 1 tablet (500 mg total) by mouth daily.  . [DISCONTINUED] omeprazole (PRILOSEC) 20 MG capsule Take 20 mg by mouth daily.  . [DISCONTINUED] oxyCODONE-acetaminophen (PERCOCET) 10-325 MG per tablet Take 1 tablet by mouth every 4 (four) hours as needed for pain.   No facility-administered encounter medications on file as of 11/09/2017.     Surgical History: Past Surgical History:  Procedure Laterality Date  . ABDOMINAL HYSTERECTOMY    . ANTERIOR CERVICAL DECOMP/DISCECTOMY FUSION N/A 01/20/2014   Procedure: ANTERIOR CERVICAL DECOMPRESSION/DISCECTOMY FUSION 2 LEVELS  cervical five/six six Tarry Kos;  Surgeon: Ophelia Charter, MD;  Location: [place] [facility];  Service: Neurosurgery;  Laterality: N/A;  . CARDIAC CATHETERIZATION     2011  . EYE SURGERY     lasik  . ROTATOR CUFF REPAIR     left shoulder  . TOE SURGERY Right    has pin and plate in it    Medical History: Past Medical History:   Diagnosis Date  . Arthritis   . COPD (chronic obstructive pulmonary disease) ([place])   . GERD (gastroesophageal reflux disease)    "sometimes"  . Headache(784.0)    sinus headaches  . Myocardial infarction ([place])    in 2011  no damage  . Sleep apnea     Family History: Family History  Problem Relation Age of Onset  . Breast cancer Maternal Aunt       Review of Systems  Constitutional: Positive for chills and fatigue. Negative for activity change and fever.  HENT: Positive for congestion, postnasal drip, rhinorrhea, sinus pressure and sinus pain.   Eyes: Negative.   Respiratory: Positive for cough.   Gastrointestinal: Negative for constipation, diarrhea, nausea and vomiting.  Genitourinary: Negative.   Musculoskeletal: Positive for arthralgias.  Allergic/Immunologic: Positive for environmental allergies.  Neurological: Positive for headaches.  Hematological: Positive for adenopathy.     Today's Vitals   11/09/17 0907  BP: (!) 151/93  Pulse: 74  Resp: 16  Temp: (!) 96.8 F (36 C)  SpO2: 94%  Weight: 147 lb (66.7 kg)  Height: 5' (1.524 m)    Physical Exam  Constitutional: She is oriented to person, place, and time. She appears well-developed and well-nourished. She appears ill. No distress.  HENT:  Head: Normocephalic and atraumatic.  Nose: Rhinorrhea present. Right sinus exhibits maxillary sinus tenderness and frontal sinus tenderness. Left sinus exhibits maxillary sinus tenderness and frontal sinus tenderness.  Mouth/Throat: Oropharynx is clear and moist. No oropharyngeal exudate.  Eyes: EOM are normal. Pupils are equal, round, and reactive to light.  Neck: Normal range of motion. Neck supple. No JVD present. Carotid bruit is not present. No tracheal deviation present. No thyromegaly present.  Cardiovascular: Normal rate, regular rhythm and normal heart sounds. Exam reveals no gallop and no friction rub.  No murmur heard. Pulmonary/Chest: Effort normal and breath  sounds normal. No respiratory distress. She has no wheezes. She has no rales. She exhibits no tenderness. Right breast exhibits no inverted nipple, no mass, no nipple discharge, no skin change and no tenderness. Left breast exhibits no inverted nipple, no mass, no nipple discharge, no skin change and no tenderness.  Congested, non-productive cough noted   Abdominal: Soft. Bowel sounds are normal. There is no tenderness.  Musculoskeletal: Normal range of motion.  Lymphadenopathy:    She has cervical adenopathy.  Neurological: She is alert and oriented to person, place, and time. No cranial nerve deficit.  Skin: Skin is warm and dry. She is not diaphoretic.  Psychiatric: She has a normal mood and affect. Her behavior is normal. Judgment and thought content normal.  Nursing note and vitals reviewed.  Assessment/Plan: 1. Encounter for general adult medical examination with abnormal findings Annual health maintenance exam today  2. Acute sinusitis with symptoms > 10 days - amoxicillin-clavulanate (AUGMENTIN) 875-125 MG tablet; Take 1 tablet by mouth 2 (two) times daily.  Dispense: 20 tablet; Refill: 0 Recommend use of OTC medications to treat symptoms.   3. Perennial allergic rhinitis Continue allergy medications as prescribed .  4. Mixed hyperlipidemia  chek fasting lipid panel and adjust simvastatin as indicated.   5. Dysuria - Urinalysis, Routine w reflex microscopic  6. Screening for breast cancer - MM DIGITAL SCREENING BILATERAL; Future  General Counseling: kalii chesmore understanding of the findings of todays visit and agrees with plan of treatment. I have discussed any further diagnostic evaluation that may be needed or ordered today. We also reviewed her medications today. she has been encouraged to call the office with any questions or concerns that should arise related to todays visit.   This patient was seen by Leretha Pol, FNP- C in Collaboration with Dr Lavera Guise as  a part of collaborative care agreement    Orders Placed This Encounter  Procedures  . MM DIGITAL SCREENING BILATERAL  . Urinalysis, Routine w reflex microscopic    Meds ordered this encounter  Medications  . amoxicillin-clavulanate (AUGMENTIN) 875-125 MG tablet    Sig: Take 1 tablet by mouth 2 (two) times daily.    Dispense:  20 tablet    Refill:  0    Order Specific Question:   Supervising Provider    Answer:   Lavera Guise [[id number]]    Time spent: [place], MD  Internal Medicine

## 2017-11-10 LAB — URINALYSIS, ROUTINE W REFLEX MICROSCOPIC
BILIRUBIN UA: NEGATIVE
Glucose, UA: NEGATIVE
KETONES UA: NEGATIVE
Nitrite, UA: NEGATIVE
PH UA: 6.5 (ref 5.0–7.5)
PROTEIN UA: NEGATIVE
RBC UA: NEGATIVE
SPEC GRAV UA: 1.008 (ref 1.005–1.030)
UUROB: 0.2 mg/dL (ref 0.2–1.0)

## 2017-11-10 LAB — MICROSCOPIC EXAMINATION: Casts: NONE SEEN /lpf

## 2017-11-28 ENCOUNTER — Ambulatory Visit
Admission: RE | Admit: 2017-11-28 | Discharge: 2017-11-28 | Disposition: A | Discharge status: Home / Self Care | Payer: Medicare HMO | Source: Ambulatory Visit | Attending: Physical Medicine and Rehabilitation | Admitting: Physical Medicine and Rehabilitation

## 2017-11-28 DIAGNOSIS — M5126 Other intervertebral disc displacement, lumbar region: Secondary | ICD-10-CM | POA: Diagnosis not present

## 2017-11-28 DIAGNOSIS — M5416 Radiculopathy, lumbar region: Secondary | ICD-10-CM | POA: Diagnosis not present

## 2017-11-28 DIAGNOSIS — M545 Low back pain: Secondary | ICD-10-CM | POA: Diagnosis not present

## 2017-11-28 DIAGNOSIS — M48061 Spinal stenosis, lumbar region without neurogenic claudication: Secondary | ICD-10-CM | POA: Diagnosis not present

## 2017-12-07 DIAGNOSIS — I2510 Atherosclerotic heart disease of native coronary artery without angina pectoris: Secondary | ICD-10-CM | POA: Diagnosis not present

## 2017-12-07 DIAGNOSIS — Z9989 Dependence on other enabling machines and devices: Secondary | ICD-10-CM | POA: Diagnosis not present

## 2017-12-07 DIAGNOSIS — E782 Mixed hyperlipidemia: Secondary | ICD-10-CM | POA: Diagnosis not present

## 2017-12-07 DIAGNOSIS — G4733 Obstructive sleep apnea (adult) (pediatric): Secondary | ICD-10-CM | POA: Diagnosis not present

## 2017-12-13 DIAGNOSIS — Z79899 Other long term (current) drug therapy: Secondary | ICD-10-CM | POA: Diagnosis not present

## 2017-12-13 DIAGNOSIS — M0579 Rheumatoid arthritis with rheumatoid factor of multiple sites without organ or systems involvement: Secondary | ICD-10-CM | POA: Diagnosis not present

## 2017-12-27 ENCOUNTER — Other Ambulatory Visit: Payer: Self-pay | Admitting: Nurse Practitioner

## 2017-12-27 DIAGNOSIS — I10 Essential (primary) hypertension: Secondary | ICD-10-CM | POA: Diagnosis not present

## 2017-12-27 DIAGNOSIS — E559 Vitamin D deficiency, unspecified: Secondary | ICD-10-CM | POA: Diagnosis not present

## 2017-12-27 DIAGNOSIS — E782 Mixed hyperlipidemia: Secondary | ICD-10-CM | POA: Diagnosis not present

## 2017-12-27 DIAGNOSIS — Z0001 Encounter for general adult medical examination with abnormal findings: Secondary | ICD-10-CM | POA: Diagnosis not present

## 2017-12-28 ENCOUNTER — Ambulatory Visit
Admission: RE | Admit: 2017-12-28 | Discharge: 2017-12-28 | Disposition: A | Discharge status: Home / Self Care | Payer: Medicare HMO | Source: Ambulatory Visit | Attending: Nurse Practitioner | Admitting: Nurse Practitioner

## 2017-12-28 DIAGNOSIS — Z1239 Encounter for other screening for malignant neoplasm of breast: Secondary | ICD-10-CM

## 2017-12-28 DIAGNOSIS — Z1231 Encounter for screening mammogram for malignant neoplasm of breast: Secondary | ICD-10-CM | POA: Diagnosis not present

## 2017-12-28 DIAGNOSIS — R928 Other abnormal and inconclusive findings on diagnostic imaging of breast: Secondary | ICD-10-CM | POA: Diagnosis not present

## 2017-12-28 DIAGNOSIS — R921 Mammographic calcification found on diagnostic imaging of breast: Secondary | ICD-10-CM | POA: Diagnosis not present

## 2017-12-28 LAB — COMPREHENSIVE METABOLIC PANEL
ALBUMIN: 4.5 g/dL (ref 3.6–4.8)
ALT: 20 IU/L (ref 0–32)
AST: 16 IU/L (ref 0–40)
Albumin/Globulin Ratio: 2.1 (ref 1.2–2.2)
Alkaline Phosphatase: 78 IU/L (ref 39–117)
BUN / CREAT RATIO: 13 (ref 12–28)
BUN: 11 mg/dL (ref 8–27)
Bilirubin Total: 0.3 mg/dL (ref 0.0–1.2)
CALCIUM: 9.2 mg/dL (ref 8.7–10.3)
CHLORIDE: 105 mmol/L (ref 96–106)
CO2: 19 mmol/L — AB (ref 20–29)
CREATININE: 0.86 mg/dL (ref 0.57–1.00)
GFR, EST AFRICAN AMERICAN: 84 mL/min/{1.73_m2} (ref >59)
GFR, EST NON AFRICAN AMERICAN: 73 mL/min/{1.73_m2} (ref >59)
GLUCOSE: 96 mg/dL (ref 65–99)
Globulin, Total: 2.1 g/dL (ref 1.5–4.5)
Potassium: 3.8 mmol/L (ref 3.5–5.2)
Sodium: 144 mmol/L (ref 134–144)
Total Protein: 6.6 g/dL (ref 6.0–8.5)

## 2017-12-28 LAB — CBC
HEMATOCRIT: 45.7 % (ref 34.0–46.6)
HEMOGLOBIN: 15.3 g/dL (ref 11.1–15.9)
MCH: 31.2 pg (ref 26.6–33.0)
MCHC: 33.5 g/dL (ref 31.5–35.7)
MCV: 93 fL (ref 79–97)
Platelets: 295 10*3/uL (ref 150–379)
RBC: 4.91 x10E6/uL (ref 3.77–5.28)
RDW: 15.4 % (ref 12.3–15.4)
WBC: 11.4 10*3/uL — ABNORMAL HIGH (ref 3.4–10.8)

## 2017-12-28 LAB — T4, FREE: Free T4: 1.18 ng/dL (ref 0.82–1.77)

## 2017-12-28 LAB — TSH: TSH: 1.75 u[IU]/mL (ref 0.450–4.500)

## 2017-12-28 LAB — VITAMIN D 25 HYDROXY (VIT D DEFICIENCY, FRACTURES): VIT D 25 HYDROXY: 28 ng/mL — AB (ref 30.0–100.0)

## 2017-12-28 LAB — LIPID PANEL W/O CHOL/HDL RATIO
Cholesterol, Total: 180 mg/dL (ref 100–199)
HDL: 62 mg/dL (ref >39)
LDL CALC: 91 mg/dL (ref 0–99)
Triglycerides: 133 mg/dL (ref 0–149)
VLDL Cholesterol Cal: 27 mg/dL (ref 5–40)

## 2018-01-01 ENCOUNTER — Other Ambulatory Visit: Payer: Self-pay | Admitting: Nurse Practitioner

## 2018-01-01 DIAGNOSIS — R928 Other abnormal and inconclusive findings on diagnostic imaging of breast: Secondary | ICD-10-CM

## 2018-01-01 DIAGNOSIS — R921 Mammographic calcification found on diagnostic imaging of breast: Secondary | ICD-10-CM

## 2018-01-16 ENCOUNTER — Other Ambulatory Visit: Payer: Self-pay | Admitting: Certified Nurse Midwife

## 2018-01-16 DIAGNOSIS — R928 Other abnormal and inconclusive findings on diagnostic imaging of breast: Secondary | ICD-10-CM

## 2018-01-16 DIAGNOSIS — N6320 Unspecified lump in the left breast, unspecified quadrant: Secondary | ICD-10-CM

## 2018-01-17 ENCOUNTER — Ambulatory Visit
Admission: RE | Admit: 2018-01-17 | Discharge: 2018-01-17 | Disposition: A | Discharge status: Home / Self Care | Payer: Medicare HMO | Source: Ambulatory Visit | Attending: Nurse Practitioner | Admitting: Nurse Practitioner

## 2018-01-17 DIAGNOSIS — R921 Mammographic calcification found on diagnostic imaging of breast: Secondary | ICD-10-CM

## 2018-01-17 DIAGNOSIS — R928 Other abnormal and inconclusive findings on diagnostic imaging of breast: Secondary | ICD-10-CM

## 2018-01-19 ENCOUNTER — Other Ambulatory Visit: Payer: Self-pay

## 2018-01-19 MED ORDER — MONTELUKAST SODIUM 10 MG PO TABS: 10.0000 mg | ORAL_TABLET | Freq: Every day | ORAL | 3 refills | Status: DC

## 2018-01-22 ENCOUNTER — Other Ambulatory Visit: Payer: Self-pay | Admitting: Nurse Practitioner

## 2018-01-22 DIAGNOSIS — R921 Mammographic calcification found on diagnostic imaging of breast: Secondary | ICD-10-CM

## 2018-01-22 DIAGNOSIS — R928 Other abnormal and inconclusive findings on diagnostic imaging of breast: Secondary | ICD-10-CM

## 2018-02-01 ENCOUNTER — Ambulatory Visit
Admission: RE | Admit: 2018-02-01 | Discharge: 2018-02-01 | Disposition: A | Discharge status: Home / Self Care | Payer: Medicare HMO | Source: Ambulatory Visit | Attending: Nurse Practitioner | Admitting: Nurse Practitioner

## 2018-02-01 DIAGNOSIS — D0511 Intraductal carcinoma in situ of right breast: Secondary | ICD-10-CM | POA: Diagnosis not present

## 2018-02-01 DIAGNOSIS — R921 Mammographic calcification found on diagnostic imaging of breast: Secondary | ICD-10-CM

## 2018-02-01 DIAGNOSIS — R928 Other abnormal and inconclusive findings on diagnostic imaging of breast: Secondary | ICD-10-CM

## 2018-02-01 HISTORY — PX: BREAST BIOPSY: SHX20

## 2018-02-01 HISTORY — DX: Intraductal carcinoma in situ of right breast: D05.11

## 2018-02-02 ENCOUNTER — Other Ambulatory Visit: Payer: Self-pay | Admitting: Pathology

## 2018-02-02 LAB — SURGICAL PATHOLOGY

## 2018-02-06 ENCOUNTER — Telehealth: Payer: Self-pay

## 2018-02-06 ENCOUNTER — Other Ambulatory Visit: Payer: Self-pay

## 2018-02-06 DIAGNOSIS — D0511 Intraductal carcinoma in situ of right breast: Secondary | ICD-10-CM

## 2018-02-06 NOTE — Telephone Encounter (Signed)
Radiology called letting us know about about pt biopsy results. Wanted to know if we want to let patient know or do we want radiology to contact her. I called back on [phone] to let them know we will be letting the pt know about her results.

## 2018-02-06 NOTE — Progress Notes (Signed)
  Oncology Nurse Navigator Documentation  Navigator Location: [facility] (02/06/18 1600)   )Navigator Encounter Type: Introductory phone call (02/06/18 1600)   Abnormal Finding Date: 01/17/18 (02/06/18 1600) Confirmed Diagnosis Date: 02/01/18 (02/06/18 1600)               Patient Visit Type: Initial (02/06/18 1600)   Barriers/Navigation Needs: Coordination of Care;Education (02/06/18 1600) Education: Accessing Care/ Finding Providers;Coping with Diagnosis/ Prognosis;Newly Diagnosed Cancer Education (02/06/18 1600) Interventions: Coordination of Care;Education (02/06/18 1600)                      Time Spent with Patient: 60 (02/06/18 1600)   Introduced IT trainer.  Scheduled surgical consult with Dr. Peyton Najjar on 02/09/18 at 8:00, and Med/Onc consult with Dr. Tasia Catchings on 02/14/18 at 11:00.  Will take Breast Cancer treatment Handbook to [facility] at [facility] to give to patient.

## 2018-02-06 NOTE — Telephone Encounter (Signed)
Called pt to schedule an appt to discuss her biopsy results and lmom and asked if she can come in today 02/06/18 at 1:45 to discuss them.

## 2018-02-06 NOTE — Telephone Encounter (Signed)
Pt called back in regards to sched an appt with [place] to get her biopsy. She said she could not come in today 02/06/18. Let Dr. Clayborn Bigness  Know of this, so Dr. Humphrey Rolls spoke with patient on the phone about her results. Pt will be referred to surgery per protocol of [facility]. I called Radiology back to let them know that they can go ahead and do her referrals. Radiology said that they have two nurses that will be taking care of those.

## 2018-02-08 ENCOUNTER — Other Ambulatory Visit: Payer: Self-pay

## 2018-02-08 NOTE — Progress Notes (Signed)
  Oncology Nurse Navigator Documentation  Navigator Location: [facility] (02/08/18 1100)   )Navigator Encounter Type: Telephone (02/08/18 1100)                     Patient Visit Type: Follow-up (02/08/18 1100)   Barriers/Navigation Needs: Coordination of Care;Education (02/08/18 1100) Education: Newly Diagnosed Cancer Education;Coping with Diagnosis/ Prognosis (02/08/18 1100) Interventions: Coordination of Care;Education (02/08/18 1100)   Coordination of Care: Appts (02/08/18 1100) Education Method: Written (02/08/18 1100)                Time Spent with Patient: 30 (02/08/18 1100)   Breast Cancer Treatment Handbook/folder with hospital services delivered to Dr. Deniece Ree office. Phoned patient for risk assessment/case conference information.  Patient called back stating she takes Methotrexate for arthritis, and wondered if important for MD to know. Instructed to bring photo ID, Insurance and current medications to consult appointments with Dr. Peyton Najjar, and Dr. Tasia Catchings.

## 2018-02-09 ENCOUNTER — Telehealth: Payer: Self-pay

## 2018-02-09 ENCOUNTER — Ambulatory Visit: Payer: Self-pay | Admitting: General Surgery

## 2018-02-09 DIAGNOSIS — C50411 Malignant neoplasm of upper-outer quadrant of right female breast: Secondary | ICD-10-CM | POA: Diagnosis not present

## 2018-02-09 NOTE — Telephone Encounter (Signed)
Faxed SX clearance to Dr Ferrel Logan signed by Nancy Nordmann

## 2018-02-09 NOTE — H&P (Signed)
PATIENT PROFILE: Michelle Moore is a 62 y.o. female who presents to the Clinic for consultation at the request of Dr. Junius Creamer for evaluation of right breast cancer.  PCP:  Lavera Guise, MD  HISTORY OF PRESENT ILLNESS: Michelle Moore reports she had her regular yearly screening mammogram. Denies any complain of breast pain, palpable mass, skin changes, nipple secretions or retraction. On screening mammography, suspicious calcifications were found which were confirmed by diagnostic mammogram on the upper outer quadrant of the right breast. The calcifications were described as a linear pattern with a span of 2.3cm. The calcifications were biopsied by stereotatic core needle biopsy. Biopsy shows right breast DCIS, high grade, comedy type. ER/PR receptors deferred for the excision specimen. Post biopsy X marker left in place on the posterior aspect of the calcifications.  Family history of breast cancer: two aunts from mother side Family history of other cancers: none Menarche: 62 years old Menopause: 62 years old after hysterectomy due to fibromas Used OCP: in her 47's for ~2-3 years Used estrogen and progesterone therapy: yes (less than a year, but does not recall the name)  History of Radiation to the chest: none No previous breast biopsy.   PROBLEM LIST:         Problem List  Date Reviewed: 12/07/2017         Noted   Malignant neoplasm of upper-outer quadrant of right female breast (CMS-HCC) 02/09/2018   Rheumatoid arthritis involving multiple sites with positive rheumatoid factor (CMS-HCC) 11/02/2016   Non-ST elevation myocardial infarction (NSTEMI), subendocardial infarction, subsequent episode of care (CMS-HCC) 08/19/2015   Lumbar radiculitis 10/24/2014   DDD (degenerative disc disease), lumbar 10/24/2014   Gastroesophageal reflux disease with esophagitis 08/14/2014   Bilateral carotid artery stenosis 08/14/2014   Overview    Less than 50% right and 70%left 2018      Pes anserine  bursitis 08/06/2014   PRES (posterior reversible encephalopathy syndrome) 07/14/2014   OSA on CPAP 07/14/2014   Overview    Super compliant to her home CPAP, was about to be scheduled in [place] for a CPAP titration -classically, untreated sleep apneas (especially obstructive) are known to be a cause of HTN, often refractory to treatment and  -recently, untreated sleep apneas (especially obstructive) have been found for patients with major depression to be a likely predictor of treatment-resistant depression.       Weight loss of more than 10% body weight (intentional) 07/14/2014   Overview    With PRES syndrome please do not use Chinese herbal remedies to lose weight      Cervical spondylosis without myelopathy 01/20/2014   Coronary artery disease Unknown   Overview    S/P SUBENDOCARDIAL MYOCARDIAL INFARCTION, PCI AND XIENCE STENT PLACED IN THE RCA 10/2009      Mixed hyperlipidemia Unknown   S/P cardiac catheterization Unknown   Overview    SHOWING 50% OBTUSE MARGINAL ATHEROSCLEROSIS         GENERAL REVIEW OF SYSTEMS:   General ROS: negative for - chills, fatigue, fever, weight gain or weight loss Allergy and Immunology ROS: negative for - hives  Hematological and Lymphatic ROS: positive for - bleeding problems or bruising, negative for palpable nodes Endocrine ROS: negative for - heat or cold intolerance, hair changes Respiratory ROS: negative for - cough, shortness of breath or wheezing Cardiovascular ROS: no chest pain or palpitations GI ROS: negative for nausea, vomiting, abdominal pain, diarrhea, constipation Musculoskeletal ROS: positive for - joint swelling or muscle pain Neurological ROS: negative  for - confusion, syncope Dermatological ROS: negative for pruritus and rash Psychiatric: negative for anxiety, depression, difficulty sleeping and memory loss  MEDICATIONS: CurrentMedications        Current Outpatient Medications   Medication Sig Dispense Refill  . ALPRAZolam (XANAX) 0.25 MG tablet Take 0.25 mg by mouth nightly as needed.    0  . aspirin 81 MG EC tablet Take 81 mg by mouth once daily.      Marland Kitchen aspirin-acetaminophen-caffeine (EXCEDRIN MIGRAINE) 250-250-65 mg per tablet Take 2 tablets by mouth every 6 (six) hours as needed.      . citalopram (CELEXA) 40 MG tablet Take 40 mg by mouth nightly.    2  . fluticasone-salmeterol (ADVAIR DISKUS) 250-50 mcg/dose diskus inhaler Inhale 1 inhalation into the lungs every 12 (twelve) hours.    . folic acid (FOLVITE) 1 MG tablet TAKE TWO TABLETS DAILY AS DIRECTED BY PHYSICIAN 60 tablet 3  . loratadine (CLARITIN) 10 mg tablet Take 10 mg by mouth once daily.    . methotrexate (RHEUMATREX) 2.5 MG tablet TAKE 8 TABLETS WEEKLY AS DIRECTED BY PHYSICIAN 32 tablet 4  . montelukast (SINGULAIR) 10 mg tablet Take 10 mg by mouth nightly.    Marland Kitchen omeprazole (PRILOSEC) 20 MG DR capsule Take 20 mg by mouth once daily.    . predniSONE (DELTASONE) 5 MG tablet Take as directed - 12 day taper 48 tablet 0  . simvastatin (ZOCOR) 40 MG tablet Take 1 tablet (40 mg total) by mouth nightly. 30 tablet 5  . traMADol (ULTRAM) 50 mg tablet TAKE 1 TO 2 TABLETS TWICE A DAY AS NEEDED 120 tablet 5   No current facility-administered medications for this visit.       ALLERGIES: Patient has no known allergies.  PAST MEDICAL HISTORY:     Past Medical History:  Diagnosis Date  . Allergic state   . Anxiety   . Breast cancer , unspecified (CMS-HCC) 01/2018  . COPD (chronic obstructive pulmonary disease) , unspecified (CMS-HCC)   . Coronary artery disease   . Depression   . Hyperlipidemia   . Hypertension   . Myocardial infarction (CMS-HCC)   . OSA on CPAP 07/14/2014   Super compliant to her home CPAP, was about to be scheduled in [place] for a CPAP titration -classically, untreated sleep apneas (especially obstructive) are known to be a cause of HTN, often  refractory to treatment and  -recently, untreated sleep apneas (especially obstructive) have been found for patients with major depression to be a likely predictor of treatment-resistant depression.   Marland Kitchen PRES (posterior reversible encephalopathy syndrome) 07/14/2014  . QT prolongation 07/14/2014   On admission 07/11/2014 QTc was reported as 489 msec, on an ECG without arrhythmia(s) or conduction disorder(s) complicating Qtc interpretation. Plan: decrease citalopram from 40 to 20, serial AM ECGs and monitor & supplement prn potassium, magnesium & calcium.     . S/P cardiac catheterization   . Seasonal allergies   . Tobacco user   . VHD (valvular heart disease)   . Weight loss of more than 10% body weight (intentional) 07/14/2014   With PRES syndrome please do not use Chinese herbal remedies to lose weight    PAST SURGICAL HISTORY:      Past Surgical History:  Procedure Laterality Date  . Anterior Cervical Discectomy and Fusion  01/20/2014  . APPENDECTOMY  Age 40  . ARTHROSCOPIC ROTATOR CUFF REPAIR    . Cardiac cath    . COLONOSCOPY  02/25/2005   Adenomatous Polyps: CBF  02/2007  . CORONARY ANGIOPLASTY    . EGD  02/25/2005   No repeat per RTE  . HYSTERECTOMY  1996  . PCI and Xience stent placed in the right coronary artery  10/2009  . Toe Surgery (w/ hardware)       FAMILY HISTORY:      Family History  Problem Relation Age of Onset  . Coronary Artery Disease (Blocked arteries around heart) Mother   . Diabetes type II Mother   . Kidney disease Mother   . Arthritis Mother   . Gout Mother   . Myocardial Infarction (Heart attack) Father   . Breast cancer Maternal Aunt      SOCIAL HISTORY: Social History          Socioeconomic History  . Marital status: Married    Spouse name: Not on file  . Number of children: Not on file  . Years of education: Not on file  . Highest education level: Not on file  Occupational History  . Not on file   Social Needs  . Financial resource strain: Not on file  . Food insecurity:    Worry: Not on file    Inability: Not on file  . Transportation needs:    Medical: Not on file    Non-medical: Not on file  Tobacco Use  . Smoking status: Former Smoker    Packs/day: 1.00    Years: 40.00    Pack years: 40.00    Types: Cigarettes    Last attempt to quit: 04/2016    Years since quitting: 1.8  . Smokeless tobacco: Never Used  Substance and Sexual Activity  . Alcohol use: No    Alcohol/week: 0.6 oz    Types: 1 Standard drinks or equivalent per week  . Drug use: No  . Sexual activity: Never    Partners: Male  Other Topics Concern  . Not on file  Social History Narrative   1 son   Previously worked as Copywriter, advertising for [employer]:    Vitals:   02/09/18 0809  BP: 115/86  Pulse: 73  Temp: 36.7 C (98 F)   Body mass index is 28.71 kg/m. Weight: 66.7 kg (147 lb)   GENERAL: Alert, active, oriented x3  HEENT: Pupils equal reactive to light. Extraocular movements are intact. Sclera clear. Palpebral conjunctiva normal red color.Pharynx clear.  NECK: Supple with no palpable mass and no adenopathy.  LUNGS: Sound clear with no rales rhonchi or wheezes.  HEART: Regular rhythm S1 and S2 without murmur.  BREAST: both breasts appear normal, no suspicious masses, no skin or nipple changes or axillary nodes. Minimal induration on the right breast biopsy area. No ecchymosis.   ABDOMEN: Soft and depressible, nontender with no palpable mass, no hepatomegaly.  EXTREMITIES: Well-developed well-nourished symmetrical with no dependent edema.  NEUROLOGICAL: Awake alert oriented, facial expression symmetrical, moving all extremities.  REVIEW OF DATA: I have reviewed the following data today:      Appointment on 12/13/2017  Component Date Value  . AST  12/13/2017 15   . ALT  12/13/2017 16   . Creatinine 12/13/2017 0.8    . Glomerular Filtration Ra* 12/13/2017 73   . Albumin 12/13/2017 4.3   . Sedimentation Rate-Autom* 12/13/2017 14   . WBC (White Blood Cell Co* 12/13/2017 8.5   . RBC (Red Blood Cell Coun* 12/13/2017 5.12   . Hemoglobin 12/13/2017 16.2*  . Hematocrit 12/13/2017 50.2*  . MCV (Mean Corpuscular  Vo* 12/13/2017 98.0   . MCH (Mean Corpuscular He* 12/13/2017 31.6*  . MCHC (Mean Corpuscular H* 12/13/2017 32.3   . Platelet Count 12/13/2017 286   . RDW-CV (Red Cell Distrib* 12/13/2017 14.1   . MPV (Mean Platelet Volum* 12/13/2017 9.7   . Neutrophils 12/13/2017 5.23   . Lymphocytes 12/13/2017 2.24   . Monocytes 12/13/2017 0.80   . Eosinophils 12/13/2017 0.10   . Basophils 12/13/2017 0.06   . Neutrophil % 12/13/2017 61.7   . Lymphocyte % 12/13/2017 26.5   . Monocyte % 12/13/2017 9.5   . Eosinophil % 12/13/2017 1.2   . Basophil% 12/13/2017 0.7   . Immature Granulocyte % 12/13/2017 0.4   . Immature Granulocyte Cou* 12/13/2017 0.03     I personally reviewed the pathology report of the breast biopsy and discussed with patient   DIAGNOSIS:  A. BREAST, RIGHT UPPER OUTER QUADRANT; STEREOTACTIC BIOPSY:  - HIGH-GRADE DUCTAL CARCINOMA IN SITU (DCIS), COMEDO TYPE.  - CALCIFICATIONS ARE ASSOCIATED WITH DCIS.   Comment:  DCIS is present in 3 of 7 blocks with the largest linear focus measuring  8 mm.  ER and PR are deferred to an excision specimen. These findings were  communicated to [facility] and Dr. Kateri Plummer office on 02/02/2018. Read  back procedure performed   I personally reviewed the images of the screening mammogram, the diagnostic mammogram and the post biopsy marker placement with X marker.   ASSESSMENT: Ms. Barreras is a 62 y.o. female presenting for consultation for right breast DCIS.    Patient was oriented again about the pathology results. Surgical alternatives were discussed with patient including partial vs total mastectomy. Surgical technique and post operative care was  discussed with patient. Risk of surgery was discussed with patient including but not limited to: wound infection, seroma, hematoma, brachial plexopathy, mondor's disease (thrombosis of small veins of breast), chronic wound pain, breast lymphedema, altered sensation to the nipple and cosmesis among others. Patient also oriented about the possibility of upgrading the cancer to invasive carcinoma and needing later lymph node biopsy. Also oriented about the possibility of needing re excision if margins are positive.   PLAN: 1. Needle guided partial mastectomy (19301) 2. CBC, CMP 3. Internal medicine clearance 4. Needs second marker placement 5. Stop taking aspirin 5 days before surgery 6. Contact the clinic if has any question or concern  Patient verbalized understanding, all questions were answered, and were agreeable with the plan outlined above.     Herbert Pun, MD  Electronically signed by Herbert Pun, MD

## 2018-02-09 NOTE — H&P (View-Only) (Signed)
PATIENT PROFILE: Michelle Moore is a 62 y.o. female who presents to the Clinic for consultation at the request of Dr. Junius Creamer for evaluation of right breast cancer.  PCP:  Lavera Guise, MD  HISTORY OF PRESENT ILLNESS: Michelle Moore reports she had her regular yearly screening mammogram. Denies any complain of breast pain, palpable mass, skin changes, nipple secretions or retraction. On screening mammography, suspicious calcifications were found which were confirmed by diagnostic mammogram on the upper outer quadrant of the right breast. The calcifications were described as a linear pattern with a span of 2.3cm. The calcifications were biopsied by stereotatic core needle biopsy. Biopsy shows right breast DCIS, high grade, comedy type. ER/PR receptors deferred for the excision specimen. Post biopsy X marker left in place on the posterior aspect of the calcifications.  Family history of breast cancer: two aunts from mother side Family history of other cancers: none Menarche: 62 years old Menopause: 62 years old after hysterectomy due to fibromas Used OCP: in her 19's for ~2-3 years Used estrogen and progesterone therapy: yes (less than a year, but does not recall the name)  History of Radiation to the chest: none No previous breast biopsy.   PROBLEM LIST:         Problem List  Date Reviewed: 12/07/2017         Noted   Malignant neoplasm of upper-outer quadrant of right female breast (CMS-HCC) 02/09/2018   Rheumatoid arthritis involving multiple sites with positive rheumatoid factor (CMS-HCC) 11/02/2016   Non-ST elevation myocardial infarction (NSTEMI), subendocardial infarction, subsequent episode of care (CMS-HCC) 08/19/2015   Lumbar radiculitis 10/24/2014   DDD (degenerative disc disease), lumbar 10/24/2014   Gastroesophageal reflux disease with esophagitis 08/14/2014   Bilateral carotid artery stenosis 08/14/2014   Overview    Less than 50% right and 70%left 2018      Pes anserine  bursitis 08/06/2014   PRES (posterior reversible encephalopathy syndrome) 07/14/2014   OSA on CPAP 07/14/2014   Overview    Super compliant to her home CPAP, was about to be scheduled in [place] for a CPAP titration -classically, untreated sleep apneas (especially obstructive) are known to be a cause of HTN, often refractory to treatment and  -recently, untreated sleep apneas (especially obstructive) have been found for patients with major depression to be a likely predictor of treatment-resistant depression.       Weight loss of more than 10% body weight (intentional) 07/14/2014   Overview    With PRES syndrome please do not use Chinese herbal remedies to lose weight      Cervical spondylosis without myelopathy 01/20/2014   Coronary artery disease Unknown   Overview    S/P SUBENDOCARDIAL MYOCARDIAL INFARCTION, PCI AND XIENCE STENT PLACED IN THE RCA 10/2009      Mixed hyperlipidemia Unknown   S/P cardiac catheterization Unknown   Overview    SHOWING 50% OBTUSE MARGINAL ATHEROSCLEROSIS         GENERAL REVIEW OF SYSTEMS:   General ROS: negative for - chills, fatigue, fever, weight gain or weight loss Allergy and Immunology ROS: negative for - hives  Hematological and Lymphatic ROS: positive for - bleeding problems or bruising, negative for palpable nodes Endocrine ROS: negative for - heat or cold intolerance, hair changes Respiratory ROS: negative for - cough, shortness of breath or wheezing Cardiovascular ROS: no chest pain or palpitations GI ROS: negative for nausea, vomiting, abdominal pain, diarrhea, constipation Musculoskeletal ROS: positive for - joint swelling or muscle pain Neurological ROS: negative  for - confusion, syncope Dermatological ROS: negative for pruritus and rash Psychiatric: negative for anxiety, depression, difficulty sleeping and memory loss  MEDICATIONS: CurrentMedications        Current Outpatient Medications   Medication Sig Dispense Refill  . ALPRAZolam (XANAX) 0.25 MG tablet Take 0.25 mg by mouth nightly as needed.    0  . aspirin 81 MG EC tablet Take 81 mg by mouth once daily.      Marland Kitchen aspirin-acetaminophen-caffeine (EXCEDRIN MIGRAINE) 250-250-65 mg per tablet Take 2 tablets by mouth every 6 (six) hours as needed.      . citalopram (CELEXA) 40 MG tablet Take 40 mg by mouth nightly.    2  . fluticasone-salmeterol (ADVAIR DISKUS) 250-50 mcg/dose diskus inhaler Inhale 1 inhalation into the lungs every 12 (twelve) hours.    . folic acid (FOLVITE) 1 MG tablet TAKE TWO TABLETS DAILY AS DIRECTED BY PHYSICIAN 60 tablet 3  . loratadine (CLARITIN) 10 mg tablet Take 10 mg by mouth once daily.    . methotrexate (RHEUMATREX) 2.5 MG tablet TAKE 8 TABLETS WEEKLY AS DIRECTED BY PHYSICIAN 32 tablet 4  . montelukast (SINGULAIR) 10 mg tablet Take 10 mg by mouth nightly.    Marland Kitchen omeprazole (PRILOSEC) 20 MG DR capsule Take 20 mg by mouth once daily.    . predniSONE (DELTASONE) 5 MG tablet Take as directed - 12 day taper 48 tablet 0  . simvastatin (ZOCOR) 40 MG tablet Take 1 tablet (40 mg total) by mouth nightly. 30 tablet 5  . traMADol (ULTRAM) 50 mg tablet TAKE 1 TO 2 TABLETS TWICE A DAY AS NEEDED 120 tablet 5   No current facility-administered medications for this visit.       ALLERGIES: Patient has no known allergies.  PAST MEDICAL HISTORY:     Past Medical History:  Diagnosis Date  . Allergic state   . Anxiety   . Breast cancer , unspecified (CMS-HCC) 01/2018  . COPD (chronic obstructive pulmonary disease) , unspecified (CMS-HCC)   . Coronary artery disease   . Depression   . Hyperlipidemia   . Hypertension   . Myocardial infarction (CMS-HCC)   . OSA on CPAP 07/14/2014   Super compliant to her home CPAP, was about to be scheduled in [place] for a CPAP titration -classically, untreated sleep apneas (especially obstructive) are known to be a cause of HTN, often  refractory to treatment and  -recently, untreated sleep apneas (especially obstructive) have been found for patients with major depression to be a likely predictor of treatment-resistant depression.   Marland Kitchen PRES (posterior reversible encephalopathy syndrome) 07/14/2014  . QT prolongation 07/14/2014   On admission 07/11/2014 QTc was reported as 489 msec, on an ECG without arrhythmia(s) or conduction disorder(s) complicating Qtc interpretation. Plan: decrease citalopram from 40 to 20, serial AM ECGs and monitor & supplement prn potassium, magnesium & calcium.     . S/P cardiac catheterization   . Seasonal allergies   . Tobacco user   . VHD (valvular heart disease)   . Weight loss of more than 10% body weight (intentional) 07/14/2014   With PRES syndrome please do not use Chinese herbal remedies to lose weight    PAST SURGICAL HISTORY:      Past Surgical History:  Procedure Laterality Date  . Anterior Cervical Discectomy and Fusion  01/20/2014  . APPENDECTOMY  Age 40  . ARTHROSCOPIC ROTATOR CUFF REPAIR    . Cardiac cath    . COLONOSCOPY  02/25/2005   Adenomatous Polyps: CBF  02/2007  . CORONARY ANGIOPLASTY    . EGD  02/25/2005   No repeat per RTE  . HYSTERECTOMY  1996  . PCI and Xience stent placed in the right coronary artery  10/2009  . Toe Surgery (w/ hardware)       FAMILY HISTORY:      Family History  Problem Relation Age of Onset  . Coronary Artery Disease (Blocked arteries around heart) Mother   . Diabetes type II Mother   . Kidney disease Mother   . Arthritis Mother   . Gout Mother   . Myocardial Infarction (Heart attack) Father   . Breast cancer Maternal Aunt      SOCIAL HISTORY: Social History          Socioeconomic History  . Marital status: Married    Spouse name: Not on file  . Number of children: Not on file  . Years of education: Not on file  . Highest education level: Not on file  Occupational History  . Not on file   Social Needs  . Financial resource strain: Not on file  . Food insecurity:    Worry: Not on file    Inability: Not on file  . Transportation needs:    Medical: Not on file    Non-medical: Not on file  Tobacco Use  . Smoking status: Former Smoker    Packs/day: 1.00    Years: 40.00    Pack years: 40.00    Types: Cigarettes    Last attempt to quit: 04/2016    Years since quitting: 1.8  . Smokeless tobacco: Never Used  Substance and Sexual Activity  . Alcohol use: No    Alcohol/week: 0.6 oz    Types: 1 Standard drinks or equivalent per week  . Drug use: No  . Sexual activity: Never    Partners: Male  Other Topics Concern  . Not on file  Social History Narrative   1 son   Previously worked as Copywriter, advertising for [employer]:    Vitals:   02/09/18 0809  BP: 115/86  Pulse: 73  Temp: 36.7 C (98 F)   Body mass index is 28.71 kg/m. Weight: 66.7 kg (147 lb)   GENERAL: Alert, active, oriented x3  HEENT: Pupils equal reactive to light. Extraocular movements are intact. Sclera clear. Palpebral conjunctiva normal red color.Pharynx clear.  NECK: Supple with no palpable mass and no adenopathy.  LUNGS: Sound clear with no rales rhonchi or wheezes.  HEART: Regular rhythm S1 and S2 without murmur.  BREAST: both breasts appear normal, no suspicious masses, no skin or nipple changes or axillary nodes. Minimal induration on the right breast biopsy area. No ecchymosis.   ABDOMEN: Soft and depressible, nontender with no palpable mass, no hepatomegaly.  EXTREMITIES: Well-developed well-nourished symmetrical with no dependent edema.  NEUROLOGICAL: Awake alert oriented, facial expression symmetrical, moving all extremities.  REVIEW OF DATA: I have reviewed the following data today:      Appointment on 12/13/2017  Component Date Value  . AST  12/13/2017 15   . ALT  12/13/2017 16   . Creatinine 12/13/2017 0.8    . Glomerular Filtration Ra* 12/13/2017 73   . Albumin 12/13/2017 4.3   . Sedimentation Rate-Autom* 12/13/2017 14   . WBC (White Blood Cell Co* 12/13/2017 8.5   . RBC (Red Blood Cell Coun* 12/13/2017 5.12   . Hemoglobin 12/13/2017 16.2*  . Hematocrit 12/13/2017 50.2*  . MCV (Mean Corpuscular  Vo* 12/13/2017 98.0   . MCH (Mean Corpuscular He* 12/13/2017 31.6*  . MCHC (Mean Corpuscular H* 12/13/2017 32.3   . Platelet Count 12/13/2017 286   . RDW-CV (Red Cell Distrib* 12/13/2017 14.1   . MPV (Mean Platelet Volum* 12/13/2017 9.7   . Neutrophils 12/13/2017 5.23   . Lymphocytes 12/13/2017 2.24   . Monocytes 12/13/2017 0.80   . Eosinophils 12/13/2017 0.10   . Basophils 12/13/2017 0.06   . Neutrophil % 12/13/2017 61.7   . Lymphocyte % 12/13/2017 26.5   . Monocyte % 12/13/2017 9.5   . Eosinophil % 12/13/2017 1.2   . Basophil% 12/13/2017 0.7   . Immature Granulocyte % 12/13/2017 0.4   . Immature Granulocyte Cou* 12/13/2017 0.03     I personally reviewed the pathology report of the breast biopsy and discussed with patient   DIAGNOSIS:  A. BREAST, RIGHT UPPER OUTER QUADRANT; STEREOTACTIC BIOPSY:  - HIGH-GRADE DUCTAL CARCINOMA IN SITU (DCIS), COMEDO TYPE.  - CALCIFICATIONS ARE ASSOCIATED WITH DCIS.   Comment:  DCIS is present in 3 of 7 blocks with the largest linear focus measuring  8 mm.  ER and PR are deferred to an excision specimen. These findings were  communicated to [facility] and Dr. Kateri Plummer office on 02/02/2018. Read  back procedure performed   I personally reviewed the images of the screening mammogram, the diagnostic mammogram and the post biopsy marker placement with X marker.   ASSESSMENT: Michelle Moore is a 62 y.o. female presenting for consultation for right breast DCIS.    Patient was oriented again about the pathology results. Surgical alternatives were discussed with patient including partial vs total mastectomy. Surgical technique and post operative care was  discussed with patient. Risk of surgery was discussed with patient including but not limited to: wound infection, seroma, hematoma, brachial plexopathy, mondor's disease (thrombosis of small veins of breast), chronic wound pain, breast lymphedema, altered sensation to the nipple and cosmesis among others. Patient also oriented about the possibility of upgrading the cancer to invasive carcinoma and needing later lymph node biopsy. Also oriented about the possibility of needing re excision if margins are positive.   PLAN: 1. Needle guided partial mastectomy (19301) 2. CBC, CMP 3. Internal medicine clearance 4. Needs second marker placement 5. Stop taking aspirin 5 days before surgery 6. Contact the clinic if has any question or concern  Patient verbalized understanding, all questions were answered, and were agreeable with the plan outlined above.     Herbert Pun, MD  Electronically signed by Herbert Pun, MD

## 2018-02-12 ENCOUNTER — Other Ambulatory Visit: Payer: Self-pay | Admitting: General Surgery

## 2018-02-12 ENCOUNTER — Ambulatory Visit: Payer: Self-pay | Admitting: General Surgery

## 2018-02-12 DIAGNOSIS — C50411 Malignant neoplasm of upper-outer quadrant of right female breast: Secondary | ICD-10-CM

## 2018-02-14 ENCOUNTER — Ambulatory Visit
Admission: RE | Admit: 2018-02-14 | Discharge: 2018-02-14 | Disposition: A | Discharge status: Home / Self Care | Payer: Medicare HMO | Source: Ambulatory Visit | Attending: Oncology | Admitting: Oncology

## 2018-02-14 ENCOUNTER — Other Ambulatory Visit: Payer: Self-pay

## 2018-02-14 ENCOUNTER — Inpatient Hospital Stay: Payer: Medicare HMO | Attending: Oncology | Admitting: Oncology

## 2018-02-14 ENCOUNTER — Encounter: Payer: Self-pay | Admitting: Oncology

## 2018-02-14 ENCOUNTER — Inpatient Hospital Stay
Admission: RE | Admit: 2018-02-14 | Discharge: 2018-02-14 | Disposition: A | Discharge status: Home / Self Care | Payer: Medicare HMO | Source: Ambulatory Visit

## 2018-02-14 ENCOUNTER — Inpatient Hospital Stay: Payer: Medicare HMO

## 2018-02-14 VITALS — BP 165/90 | HR 90 | Temp 101.0°F | Resp 21 | Ht 61.0 in | Wt 141.1 lb

## 2018-02-14 DIAGNOSIS — R918 Other nonspecific abnormal finding of lung field: Secondary | ICD-10-CM | POA: Insufficient documentation

## 2018-02-14 DIAGNOSIS — R509 Fever, unspecified: Secondary | ICD-10-CM | POA: Insufficient documentation

## 2018-02-14 DIAGNOSIS — M546 Pain in thoracic spine: Secondary | ICD-10-CM | POA: Diagnosis not present

## 2018-02-14 DIAGNOSIS — R0602 Shortness of breath: Secondary | ICD-10-CM | POA: Insufficient documentation

## 2018-02-14 DIAGNOSIS — M9902 Segmental and somatic dysfunction of thoracic region: Secondary | ICD-10-CM | POA: Diagnosis not present

## 2018-02-14 DIAGNOSIS — M9905 Segmental and somatic dysfunction of pelvic region: Secondary | ICD-10-CM | POA: Diagnosis not present

## 2018-02-14 DIAGNOSIS — Z803 Family history of malignant neoplasm of breast: Secondary | ICD-10-CM

## 2018-02-14 DIAGNOSIS — D0511 Intraductal carcinoma in situ of right breast: Secondary | ICD-10-CM

## 2018-02-14 DIAGNOSIS — R0981 Nasal congestion: Secondary | ICD-10-CM

## 2018-02-14 DIAGNOSIS — M5136 Other intervertebral disc degeneration, lumbar region: Secondary | ICD-10-CM | POA: Diagnosis not present

## 2018-02-14 DIAGNOSIS — R05 Cough: Secondary | ICD-10-CM | POA: Diagnosis not present

## 2018-02-14 DIAGNOSIS — M5442 Lumbago with sciatica, left side: Secondary | ICD-10-CM | POA: Diagnosis not present

## 2018-02-14 DIAGNOSIS — M9903 Segmental and somatic dysfunction of lumbar region: Secondary | ICD-10-CM | POA: Diagnosis not present

## 2018-02-14 LAB — CBC WITH DIFFERENTIAL/PLATELET
BASOS ABS: 0.2 10*3/uL — AB (ref 0–0.1)
BASOS PCT: 1 %
Eosinophils Absolute: 0.1 10*3/uL (ref 0–0.7)
Eosinophils Relative: 1 %
HEMATOCRIT: 47.8 % — AB (ref 35.0–47.0)
HEMOGLOBIN: 16 g/dL (ref 12.0–16.0)
LYMPHS PCT: 10 %
Lymphs Abs: 1.7 10*3/uL (ref 1.0–3.6)
MCH: 32.2 pg (ref 26.0–34.0)
MCHC: 33.5 g/dL (ref 32.0–36.0)
MCV: 96 fL (ref 80.0–100.0)
Monocytes Absolute: 1.2 10*3/uL — ABNORMAL HIGH (ref 0.2–0.9)
Monocytes Relative: 7 %
NEUTROS ABS: 13.6 10*3/uL — AB (ref 1.4–6.5)
NEUTROS PCT: 81 %
Platelets: 267 10*3/uL (ref 150–440)
RBC: 4.98 MIL/uL (ref 3.80–5.20)
RDW: 14.4 % (ref 11.5–14.5)
WBC: 16.7 10*3/uL — ABNORMAL HIGH (ref 3.6–11.0)

## 2018-02-14 LAB — URINALYSIS, COMPLETE (UACMP) WITH MICROSCOPIC
Bacteria, UA: NONE SEEN
Bilirubin Urine: NEGATIVE
Glucose, UA: NEGATIVE mg/dL
KETONES UR: NEGATIVE mg/dL
Nitrite: NEGATIVE
PH: 6 (ref 5.0–8.0)
PROTEIN: NEGATIVE mg/dL
Specific Gravity, Urine: 1.012 (ref 1.005–1.030)

## 2018-02-14 LAB — COMPREHENSIVE METABOLIC PANEL
ALBUMIN: 4.4 g/dL (ref 3.5–5.0)
ALT: 23 U/L (ref 14–54)
AST: 22 U/L (ref 15–41)
Alkaline Phosphatase: 84 U/L (ref 38–126)
Anion gap: 12 (ref 5–15)
BILIRUBIN TOTAL: 0.4 mg/dL (ref 0.3–1.2)
BUN: 8 mg/dL (ref 6–20)
CO2: 25 mmol/L (ref 22–32)
CREATININE: 0.59 mg/dL (ref 0.44–1.00)
Calcium: 9.4 mg/dL (ref 8.9–10.3)
Chloride: 104 mmol/L (ref 101–111)
GFR calc Af Amer: >60 mL/min (ref >60)
GLUCOSE: 92 mg/dL (ref 65–99)
POTASSIUM: 3.6 mmol/L (ref 3.5–5.1)
Sodium: 141 mmol/L (ref 135–145)
TOTAL PROTEIN: 7.9 g/dL (ref 6.5–8.1)

## 2018-02-14 MED ORDER — AMOXICILLIN-POT CLAVULANATE 875-125 MG PO TABS: 1.0000 | ORAL_TABLET | Freq: Two times a day (BID) | ORAL | 0 refills | Status: DC

## 2018-02-14 NOTE — Progress Notes (Signed)
Hematology/Oncology Consult note [facility] [phone] [phone]   Patient Care Team: Khan, Fozia M, MD as PCP - General (Internal Medicine)  REFERRING PROVIDER: Khan, Fozia M, MD CHIEF COMPLAINTS/PURPOSE OF CONSULTATION:  Evaluation of DCIS  HISTORY OF PRESENTING ILLNESS:  Michelle Moore is a  62 y.o.  female with PMH listed below who was referred to me for evaluation of DCIS.  Patient had routine mammogram and ultrasound which showed new pleomorphic calcifications within the upper outer quadrant of right breast, spanning 2.3 cm, with suspicion linear/ductal distribution. Biopsy pathology showed: High-grade DCIS, comedo type, calcifications are associated with DCIS. Patient was referred to us for evaluation and discussion about treatment plan.  She has met surgeon Dr. Cintron yesterday.  Today patient reports not feeling well.  She is actually having a fever of 101 in the clinic.  She reports that she started to feel sick since last Friday, having nasal congestion, cough, sore throat, and fever.  Acute onset, no aggravating factors or improving factors.  Nipple discharge: Denies Family history: Significant family history of breast cancer. OCP use: History of OCP use Estrogen and progesterone therapy: History of estrogen therapy. History of radiation to chest: denies.  Previous breast surgery: Denies    Review of Systems  Constitutional: Positive for fever. Negative for chills, malaise/fatigue and weight loss.  HENT: Positive for congestion and sore throat. Negative for ear discharge, ear pain, nosebleeds and sinus pain.   Eyes: Negative for double vision, photophobia, pain, discharge and redness.  Respiratory: Positive for cough. Negative for hemoptysis, sputum production, shortness of breath and wheezing.   Cardiovascular: Negative for chest pain, palpitations, orthopnea, claudication and leg swelling.  Gastrointestinal: Negative for  abdominal pain, blood in stool, constipation, diarrhea, heartburn, melena, nausea and vomiting.  Genitourinary: Negative for dysuria, flank pain, frequency and hematuria.  Musculoskeletal: Negative for back pain, myalgias and neck pain.  Skin: Negative for itching and rash.  Neurological: Negative for dizziness, tingling, tremors, focal weakness, weakness and headaches.  Endo/Heme/Allergies: Negative for environmental allergies. Does not bruise/bleed easily.  Psychiatric/Behavioral: Negative for depression and hallucinations. The patient is nervous/anxious.     MEDICAL HISTORY:  Past Medical History:  Diagnosis Date  . Arthritis   . COPD (chronic obstructive pulmonary disease) (HCC)   . GERD (gastroesophageal reflux disease)    "sometimes"  . Headache(784.0)    sinus headaches  . Myocardial infarction (HCC)    in 2011  no damage  . Sleep apnea     SURGICAL HISTORY: Past Surgical History:  Procedure Laterality Date  . ABDOMINAL HYSTERECTOMY    . ANTERIOR CERVICAL DECOMP/DISCECTOMY FUSION N/A 01/20/2014   Procedure: ANTERIOR CERVICAL DECOMPRESSION/DISCECTOMY FUSION 2 LEVELS cervical five/six six /seven;  Surgeon: Jeffrey D Jenkins, MD;  Location: [facility];  Service: Neurosurgery;  Laterality: N/A;  . BREAST BIOPSY Right 02/01/2018   x shape, path pending  . CARDIAC CATHETERIZATION     2011  . EYE SURGERY     lasik  . ROTATOR CUFF REPAIR     left shoulder  . TOE SURGERY Right    has pin and plate in it    SOCIAL HISTORY: Social History   Socioeconomic History  . Marital status: Married    Spouse name: Not on file  . Number of children: Not on file  . Years of education: Not on file  . Highest education level: Not on file  Occupational History  . Not on file  Social Needs  .   Financial resource strain: Not on file  . Food insecurity:    Worry: Not on file    Inability: Not on file  . Transportation needs:    Medical: Not on file    Non-medical: Not on file    Tobacco Use  . Smoking status: Former Smoker    Packs/day: 1.00    Years: 30.00    Pack years: 30.00    Types: Cigarettes    Last attempt to quit: 09/13/2015    Years since quitting: 2.4  . Smokeless tobacco: Never Used  Substance and Sexual Activity  . Alcohol use: Yes    Comment: occasional  . Drug use: No  . Sexual activity: Not on file  Lifestyle  . Physical activity:    Days per week: Not on file    Minutes per session: Not on file  . Stress: Not on file  Relationships  . Social connections:    Talks on phone: Not on file    Gets together: Not on file    Attends religious service: Not on file    Active member of club or organization: Not on file    Attends meetings of clubs or organizations: Not on file    Relationship status: Not on file  . Intimate partner violence:    Fear of current or ex partner: Not on file    Emotionally abused: Not on file    Physically abused: Not on file    Forced sexual activity: Not on file  Other Topics Concern  . Not on file  Social History Narrative  . Not on file    FAMILY HISTORY: Family History  Problem Relation Age of Onset  . Breast cancer Maternal Aunt   . Breast cancer Cousin        mat cousin  . Breast cancer Maternal Aunt     ALLERGIES:  has No Known Allergies.  MEDICATIONS:  Current Outpatient Medications  Medication Sig Dispense Refill  . ALPRAZolam (XANAX) 0.25 MG tablet Take 0.25 mg by mouth 2 (two) times daily as needed for anxiety.     . aspirin EC 81 MG tablet Take 81 mg by mouth at bedtime.    . Aspirin-Acetaminophen-Caffeine (EXCEDRIN EXTRA STRENGTH PO) Take 2 tablets by mouth daily as needed (for arm and neck pain).    . cetirizine (ZYRTEC) 10 MG tablet Take 10 mg by mouth daily as needed for allergies.     . citalopram (CELEXA) 40 MG tablet Take 40 mg by mouth at bedtime.    . fluticasone (FLONASE) 50 MCG/ACT nasal spray Place 2 sprays into both nostrils daily as needed for allergies.     .  Fluticasone-Salmeterol (ADVAIR) 250-50 MCG/DOSE AEPB Inhale 1 puff into the lungs 2 (two) times daily. 60 each 5  . folic acid (FOLVITE) 800 MCG tablet Take 1,600 mcg by mouth daily.    . methotrexate (RHEUMATREX) 2.5 MG tablet TAKE 8 TABLETS WEEKLY ON WEDNESDAYS    . montelukast (SINGULAIR) 10 MG tablet Take 1 tablet (10 mg total) by mouth at bedtime. 30 tablet 3  . Omeprazole 20 MG TBEC Take 20 mg by mouth daily.     . simvastatin (ZOCOR) 40 MG tablet Take 40 mg by mouth at bedtime.    . traMADol (ULTRAM) 50 MG tablet TAKE 1 TO 2 TABLETS TWICE A DAY AS NEEDED    . amoxicillin-clavulanate (AUGMENTIN) 875-125 MG tablet Take 1 tablet by mouth 2 (two) times daily. 14 tablet 0  . OVER   THE COUNTER MEDICATION Take 1 drop by mouth daily. CBD Oil     No current facility-administered medications for this visit.      PHYSICAL EXAMINATION: ECOG PERFORMANCE STATUS: 1 - Symptomatic but completely ambulatory Vitals:   02/14/18 1130  BP: (!) 165/90  Pulse: 90  Resp: (!) 21  Temp: (!) 101 F (38.3 C)  SpO2: 93%   Filed Weights   02/14/18 1130  Weight: 141 lb 2 oz (64 kg)    Physical Exam  Constitutional: She is oriented to person, place, and time. She appears well-developed and well-nourished. No distress.  HENT:  Head: Normocephalic and atraumatic.  Right Ear: External ear normal.  Left Ear: External ear normal.  Mouth/Throat: Oropharynx is clear and moist.  Eyes: Pupils are equal, round, and reactive to light. Conjunctivae and EOM are normal. No scleral icterus.  Neck: Normal range of motion. Neck supple.  Cardiovascular: Normal rate, regular rhythm and normal heart sounds.  Pulmonary/Chest: Effort normal and breath sounds normal. No respiratory distress. She has no wheezes. She has no rales. She exhibits no tenderness.  Abdominal: Soft. Bowel sounds are normal. She exhibits no distension and no mass. There is no tenderness.  Musculoskeletal: Normal range of motion. She exhibits no edema  or deformity.  Lymphadenopathy:    She has no cervical adenopathy.  Neurological: She is alert and oriented to person, place, and time. No cranial nerve deficit. Coordination normal.  Skin: Skin is warm and dry. No rash noted.  Psychiatric: She has a normal mood and affect. Her behavior is normal.  Breast exam was performed in seated and lying down position. Patient  No evidence of any palpable masses or lumps in both breasts.. No evidence of bilateral axillary adenopathy    LABORATORY DATA:  I have reviewed the data as listed Lab Results  Component Value Date   WBC 16.7 (H) 02/14/2018   HGB 16.0 02/14/2018   HCT 47.8 (H) 02/14/2018   MCV 96.0 02/14/2018   PLT 267 02/14/2018   Recent Labs    12/27/17 1039 02/14/18 1220  NA 144 141  K 3.8 3.6  CL 105 104  CO2 19* 25  GLUCOSE 96 92  BUN 11 8  CREATININE 0.86 0.59  CALCIUM 9.2 9.4  GFRNONAA 73 >60  GFRAA 84 >60  PROT 6.6 7.9  ALBUMIN 4.5 4.4  AST 16 22  ALT 20 23  ALKPHOS 78 84  BILITOT 0.3 0.4   RADIOGRAPHIC STUDIES: I have personally reviewed the radiological images as listed and agreed with the findings in the report. mammogram and ultrasound which showed new pleomorphic calcifications within the upper outer quadrant of right breast, spanning 2.3 cm, with suspicion linear/ductal distribution.  Pathology  SPECIMEN SUBMITTED:  A. Breast, right, UOQ  DIAGNOSIS:  A. BREAST, RIGHT UPPER OUTER QUADRANT; STEREOTACTIC BIOPSY:  - HIGH-GRADE DUCTAL CARCINOMA IN SITU (DCIS), COMEDO TYPE.  - CALCIFICATIONS ARE ASSOCIATED WITH DCIS.   ASSESSMENT & PLAN:  1. Ductal carcinoma in situ (DCIS) of right breast   2. Fever, unspecified fever cause   3. Nasal congestion   4. Family history of breast cancer    #DCIS: Mammogram and ultrasound were independently reviewed by me and results were discussed with patient.  I also reviewed the patient's pathologic results and explained to patient in detail.  She has high-grade DCIS,  spanning about 2.3 cm. I recommend proceeding surgery with lumpectomy.  She may need sentinel lymph node biopsy if final pathological report showed invasive components.  This will follow by adjuvant radiation and anti-hormonal treatments.  #Family of breast cancer: Referred to genetic counselor for discussion of genetic testing.  Patient says she is not interested in prophylactic mastectomy even if she carries any genetic mutation.  #Febrile/nasal congestion.  I will order a infectious work-up including chest x-ray, CBC, CMP, urinary urine culture. CXR was independently reviewed by me and showed chronically prominent interstinital lung marking, no superimposed active process,   Possible sinusitis.  I prescribed her a course of Augmentin twice daily for 7 days.  All questions were answered. The patient knows to call the clinic with any problems questions or concerns.  Return of visit: To be determined.  Coordinated by breast cancer RN navigator. Thank you for this kind referral and the opportunity to participate in the care of this patient. A copy of today's note is routed to referring provider  Total face to face encounter time for this patient visit was 60 min. >50% of the time was  spent in counseling and coordination of care.    Earlie Server, MD, PhD Hematology Oncology [facility] at [facility] Pager- [phone] 02/14/2018

## 2018-02-14 NOTE — Pre-Procedure Instructions (Signed)
Echo complete1/24/2018 [place] Component Name Value Ref Range  LV Ejection Fraction (%) 50   Aortic Valve Stenosis Grade none   Aortic Valve Regurgitation Grade none   Aortic Valve Max Velocity (m/s) 1.4 m/sec   Mitral Valve Stenosis Grade none   Mitral Valve Regurgitation Grade mild   Tricuspid Valve Regurgitation Grade mild   Tricuspid Valve Regurgitation Max Velocity (m/s) 2.2 m/sec   Right Ventricle Systolic Pressure (mmHg) 75.6 mmHg   LV End Diastolic Diameter (cm) 4.3 cm  LV End Systolic Diameter (cm) 3.3 cm  LV Septum Wall Thickness (cm) 1.2 cm  LV Posterior Wall Thickness (cm) 0.85 cm  Left Atrium Diameter (cm) 3.5 cm  Result Narrative  CARDIOLOGY DEPARTMENT ZAINA, JENKIN [id number] [facility]ct #: [id number] [address] [place], [place] [address]Date: 10/05/2016 08:59 AM Adult Female Age: 62 yrs ECHOCARDIOGRAM REPORT Outpatient KC::KCWC  STUDY:CHEST WALL TAPE:0000:00: 0:00:00 MD1:KOWALSKI, BRUCE JAY ECHO:Yes DOPPLER:YesFILE:0000-000-000 BP: 122/80 mmHg  COLOR:YesCONTRAST:No MACHINE:[place] Height: 60 in  RV BIOPSY:No 3D:NoSOUND QLTY:ModerateWeight: 151 lb MEDIUM:None BSA: 1.7 m2  ___________________________________________________________________________________________  HISTORY:DOE REASON:Assess, LV function INDICATION:Coronary artery disease involving native coronary artery of  nati ___________________________________________________________________________________________  ECHOCARDIOGRAPHIC MEASUREMENTS 2D DIMENSIONS AORTA ValuesNormal RangeMAIN PAValuesNormal Range Annulus:1.7 cm[2.1 - 2.5]PA Main:nm* [1.5 - 2.1] Aorta Sin:2.6 cm[2.7 - 3.3] RIGHT VENTRICLE ST Junction:nm* [2.3 - 2.9]RV Base:nm* [ < 4.2] Asc.Aorta:nm* [2.3 - 3.1] RV Mid:nm* [ < 3.5]  LEFT VENTRICLERV Length:2.8 cm[ < 8.6] LVIDd:4.3 cm[3.9 - 5.3] INFERIOR VENA CAVA LVIDs:3.3 cmMax. IVC:nm* [ <= 2.1]  FS:22.4 %[> 25]Min. IVC:nm* SWT:1.2 cm[0.5 - 0.9] ------------------ PWT:0.85 cm [0.5 - 0.9] nm* - not measured  LEFT ATRIUM LA Diam:3.5 cm[2.7 - 3.8] LA A4C Area:nm* [ < 20] LA Volume:nm* [22 - 52] ___________________________________________________________________________________________  ECHOCARDIOGRAPHIC DESCRIPTIONS  AORTIC ROOT Size:Normal Dissection:INDETERM FOR DISSECTION  AORTIC VALVE Leaflets:TricuspidMorphology:MILDLY THICKENED Mobility:Fully mobile  LEFT VENTRICLE Size:Normal Anterior:Normal  Contraction:NormalLateral:Normal Closest EF:50% (Estimated)Septal:Normal  LV Masses:No MassesApical:Normal  SAY:TKZS LVH Inferior:Normal  Posterior:Normal Dias.FxClass:N/A  MITRAL VALVE Leaflets:Normal Mobility:Fully mobile Morphology:Normal  LEFT  ATRIUM Size:NormalLA Masses:No masses  IA Septum:Normal IAS  MAIN PA Size:Normal  PULMONIC VALVE Morphology:Normal Mobility:Fully mobile  RIGHT VENTRICLE  RV Masses:No MassesSize:Normal  Free Wall:NormalContraction:Normal  TRICUSPID VALVE Leaflets:Normal Mobility:Fully mobile Morphology:Normal  RIGHT ATRIUM Size:Normal RA Other:None  RA Mass:No masses  PERICARDIUM  Fluid:No effusion  INFERIOR VENACAVA Size:Normal Normal respiratory collapse  ____________________________________________________________________  DOPPLER ECHO and OTHER SPECIAL PROCEDURES  Aortic:No AR No AS 141.2 cm/sec peak vel 8.0 mmHg peak grad   Mitral:MILD MR No MS 2.1 cm^2 by DOPPLER MV Inflow E Vel=79.1 cm/sec MV Annulus E'Vel=7.3 cm/sec E/E'Ratio=10.8  Tricuspid:MILD TR No TS 222.6 cm/sec peak TR vel24.8 mmHg peak RV pressure  Pulmonary:No PR No PS 81.4 cm/sec peak vel2.7 mmHg peak grad    ___________________________________________________________________________________________  INTERPRETATION NORMAL LEFT VENTRICULAR SYSTOLIC FUNCTION WITH MILD LVH NORMAL RIGHT VENTRICULAR SYSTOLIC FUNCTION MILD VALVULAR REGURGITATION (See above) NO VALVULAR STENOSIS EF 50%  ___________________________________________________________________________________________  Electronically signed by: MD Serafina Royals on 10/10/2016 12:35 PM Performed By: Maurilio Lovely, [place] Ordering Physician: Serafina Royals ___________________________________________________________________________________________  Status Results Details   Unavailable

## 2018-02-14 NOTE — Pre-Procedure Instructions (Signed)
Michelle Dibble, MD - 12/07/2017 4:15 PM EDT Formatting of this note might be different from the original. Established Patient Visit   Chief Complaint: Chief Complaint  Patient presents with  . Coronary Artery Disease  . Hyperlipidemia  . Hypertension  . Sleep Apnea  using Cpap  Date of Service: 12/07/2017 Date of Birth: 1956/08/02 PCP: Lavera Guise, MD  History of Present Illness: Michelle Moore is a 62 y.o.female patient  Mixed Hyperlipidemia The patient does have a diagnosis of mixed hyperlipidemia for which the patient is currently receiving Moderate intensity therapy with simvastatin (Zocor) for risk factors that include high LDL, vascular disease, coronary artery disease and >7.5% ten year cardiovascular risk score. The patient has tolerated this treatment without significant side effects. They also understand the importance of diet and exercise and healthy lifestyle measures to reduce cardiovascular risk and help with prevention as well. The patient has been compliant  Coronary Artery Disease The patient has coronary artery disease previously diagnosed by imaging study years ago currently on appropriate medication management including aspirin and statin for risk factor reduction. Current risk factors for progression include age, postmenopausal female and Hyperlipidemia. The patient appears not to have any significant side effects of treatment at this time and appears stable at this time without evidence of clinical progression of the disease process  Sleep Apnea The patient has had a diagnosis of sleep apnea confirmed by formal sleep study in the past. The patient has been placed on a CPAP machine for which they have been diligent in its use. Recent evaluation has shown 100% compliance with an average of more than 7 hours of use per night. The patient has had significant benefit from the use of the CPAP machine with considerable improvements in quality of life, work production, and  decreased medical complaints. The patient will need continued maintenance and care supplies as well as upgrades at appropriate times to continue helping with treatment of this diagnosis.  Past Medical and Surgical History  Past Medical History Past Medical History:  Diagnosis Date  . Allergic state  . Anxiety, unspecified  . COPD (chronic obstructive pulmonary disease) , unspecified (CMS-HCC)  . Coronary artery disease  . Depression, unspecified  . Hyperlipidemia, unspecified  . Hypertension  . Myocardial infarction (CMS-HCC)  . OSA on CPAP 07/14/2014  Super compliant to her home CPAP, was about to be scheduled in [place] for a CPAP titration -classically, untreated sleep apneas (especially obstructive) are known to be a cause of HTN, often refractory to treatment and -recently, untreated sleep apneas (especially obstructive) have been found for patients with major depression to be a likely predictor of treatment-resistant depression.  Marland Kitchen PRES (posterior reversible encephalopathy syndrome) 07/14/2014  . QT prolongation 07/14/2014  On admission 07/11/2014 QTc was reported as 489 msec, on an ECG without arrhythmia(s) or conduction disorder(s) complicating Qtc interpretation. Plan: decrease citalopram from 40 to 20, serial AM ECGs and monitor & supplement prn potassium, magnesium & calcium.  . S/P cardiac catheterization  . Seasonal allergies  . Tobacco user  . VHD (valvular heart disease)  . Weight loss of more than 10% body weight (intentional) 07/14/2014  With PRES syndrome please do not use Chinese herbal remedies to lose weight   Past Surgical History She has a past surgical history that includes Coronary angioplasty; Arthroscopic Rotator Cuff Repair; Anterior Cervical Discectomy and Fusion (01/20/2014); Toe Surgery (w/ hardware); Hysterectomy (1996); Appendectomy (Age 66); PCI and Xience stent placed in the right coronary artery (10/2009); Cardiac  cath; Colonoscopy (02/25/2005); and egd  (02/25/2005).   Medications and Allergies  Current Medications  Current Outpatient Medications on File Prior to Visit  Medication Sig Dispense Refill  . ALPRAZolam (XANAX) 0.25 MG tablet Take 0.25 mg by mouth nightly as needed.  0  . aspirin 81 MG EC tablet Take 81 mg by mouth once daily.   Marland Kitchen aspirin-acetaminophen-caffeine (EXCEDRIN MIGRAINE) 250-250-65 mg per tablet Take 2 tablets by mouth every 6 (six) hours as needed.   . citalopram (CELEXA) 40 MG tablet Take 40 mg by mouth nightly.  2  . fluticasone-salmeterol (ADVAIR DISKUS) 250-50 mcg/dose diskus inhaler Inhale 1 inhalation into the lungs every 12 (twelve) hours.  . folic acid (FOLVITE) 1 MG tablet TAKE TWO TABLETS DAILY AS DIRECTED BY PHYSICIAN 60 tablet 3  . loratadine (CLARITIN) 10 mg tablet Take 10 mg by mouth once daily.  . methotrexate (RHEUMATREX) 2.5 MG tablet TAKE 8 TABLETS WEEKLY AS DIRECTED BY PHYSICIAN 32 tablet 4  . montelukast (SINGULAIR) 10 mg tablet Take 10 mg by mouth nightly.  Marland Kitchen omeprazole (PRILOSEC) 20 MG DR capsule Take 20 mg by mouth once daily.  . predniSONE (DELTASONE) 5 MG tablet Take as directed - 12 day taper 48 tablet 0  . simvastatin (ZOCOR) 40 MG tablet Take 1 tablet (40 mg total) by mouth nightly. 30 tablet 5  . traMADol (ULTRAM) 50 mg tablet TAKE 1 TO 2 TABLETS TWICE A DAY AS NEEDED 120 tablet 5   No current facility-administered medications on file prior to visit.   Allergies: Patient has no known allergies.  Social and Family History  Social History reports that she quit smoking about 19 months ago. Her smoking use included cigarettes. She has a 40.00 pack-year smoking history. She has never used smokeless tobacco. She reports that she does not drink alcohol or use drugs.  Family History Family History  Problem Relation Age of Onset  . Coronary Artery Disease (Blocked arteries around heart) Mother  . Diabetes type II Mother  . Kidney disease Mother  . Arthritis Mother  . Gout Mother  .  Myocardial Infarction (Heart attack) Father  . Breast cancer Maternal Aunt   Review of Systems   Review of Systems  Positive for back pain Negative for weight gain weight loss, weakness, vision change, hearing loss, cough, congestion, PND, orthopnea, heartburn, nausea, diaphoresis, vomiting, diarrhea, bloody stool, melena, stomach pain, extremity pain, leg cramping, leg blood clots, headache, blackouts, nosebleed, trouble swallowing, mouth pain, urinary frequency, urination at night, muscle weakness, skin lesions, skin rashes, tingling ,ulcers, numbness, anxiety, and/or depression Physical Examination   Vitals:BP 128/84  Pulse 77  Ht 152.4 cm (5')  Wt 66.7 kg (147 lb)  LMP (LMP Unknown)  SpO2 93%  BMI 28.71 kg/m  Ht:152.4 cm (5') Wt:66.7 kg (147 lb) WFU:XNAT surface area is 1.68 meters squared. Body mass index is 28.71 kg/m. Appearance: well appearing in no acute distress HEENT: Pupils equally reactive to light and accomodation, no xanthalasma  Neck: Supple, no apparent thyromegaly, masses, or lymphadenopathy  Lungs: normal respiratory effort; no crackles, no rhonchi, no wheezes Heart: Regular rate and rhythm. Normal S1 S2 No gallops, murmur, no rub, PMI is normal size and placement. carotid upstroke normal without bruit. Jugular venous pressure is normal Abdomen: soft, nontender, not distended with normal bowel sounds. No apparent hepatosplenomegally. Abdominal aorta is normal size without bruit Extremities: no edema, no ulcers, no clubbing, no cyanosis Peripheral Pulses: 2+ in upper extremities, 2+ femoral pulses bilaterally, 2+lower extremity  Musculoskeletal; Normal muscle tone without kyphosis Neurological: Oriented and Alert, Cranial nerves intact  Assessment   62 y.o. female with  Encounter Diagnoses  Name Primary?  . Coronary artery disease involving native coronary artery of native heart without angina pectoris Yes  . Mixed hyperlipidemia  . OSA on CPAP   Plan  -We  have discussed risk reduction in the cardiovascular disease process by a continuation of lipid management with the current medication management for lipid reduction. The goals continue to be 30-50% lowering of LDL cholesterol in addition to lifestyle measures. This will include diet and improved activity level on a regular basis.The patient has an understanding of this discussion at this time and we will continue the appropriate strategy. -The patient will continue following closely for symptoms of cardiovascular disease including shortness of breath, chest discomfort, giving out, having palpitations, and/or significant progression in weakness and/or fatigue. They understand that these symptoms could be a clue to coronary artery disease progression and to report these symptoms as soon as possible. The patient understands to continue current medical regimen for further risk reduction and progression of symptoms and/or cardiovascular disease.  -There has been an ongoing discussion about the appropriate use the CPAP machine to reduce cardiovascular symptoms and improvements of quality of life. The patient understands that CPAP machine is very important in this measure and the hygiene and upkeep of the equipment is most important as well.  No orders of the defined types were placed in this encounter.  Return in about 6 months (around 06/09/2018).  Michelle Dibble, MD    Electronically signed by Michelle Dibble, MD at 12/07/2017 4:20 PM EDT     Plan of Treatment - documented as of this encounter  Upcoming Encounters Upcoming Encounters  Date Type Specialty Care Team Description  03/07/2018 Ancillary Orders Lab Meda Klinefelter., MD  [place], [place] [address]  [phone]  [phone] ([phone]    03/14/2018 Office Visit Rheumatology Meda Klinefelter., MD  [place], [place] [address]  [phone]  [phone] (Fax)    05/02/2018 Office Visit  Sabas Sous, DO  [address]  [place], [place] [address]  [phone]  [phone] (Fax)    06/07/2018 Office Visit Cardiology Michelle Dibble, MD  [address]  [facility]  [place], [place] [address]  [phone]  [phone] (Fax)     Visit Diagnoses - documented in this encounter  Diagnosis  Coronary artery disease involving native coronary artery of native heart without angina pectoris - Primary   Mixed hyperlipidemia   OSA on CPAP    Images Patient Contacts   Contact Name Contact Address Communication Relationship to Patient  Mardie Kellen [address] [place], [place] [address] [phone] (Home) [phone] [facility]) Spouse, Emergency [place], [place] [address] [phone] (Home) [phone] (Mobile) Son or Daughter, Emergency Contact   Document Information  Primary Care Provider Other Service Providers Document Coverage Dates  Lavera Guise MD (Feb. 11, 2016February 11, 2016 - Present) [phone] (Work) [phone] (Fax) [place], [place] [address]   Mar. 28, 2019March 28, 2019   [place] [address] [place], [place] [address]   Encounter Providers Encounter Date  Michelle Dibble MD (Attending) [phone] (Work) [phone] (Fax) [facility] [place],  [address]  Mar. 28,  2019March 28, 2019

## 2018-02-14 NOTE — Progress Notes (Signed)
  Oncology Nurse Navigator Documentation  Navigator Location: [facility]-Med Onc (02/14/18 1400)   )Navigator Encounter Type: Initial MedOnc (02/14/18 1400)                     Patient Visit Type: Initial (02/14/18 1400)   Barriers/Navigation Needs: Education;Coordination of Care (02/14/18 1400) Education: Preparing for Upcoming Surgery/ Treatment;Coping with Diagnosis/ Prognosis (02/14/18 1400) Interventions: Coordination of Care;Education;Psycho-social support (02/14/18 1400)   Coordination of Care: Appts (02/14/18 1400)                  Time Spent with Patient: 45 (02/14/18 1400)   Supported patient at initial Med/Onc consult.  Lumpectomy scheduled for 02/21/18 with Dr. Peyton Najjar. Dr. Tasia Catchings explained rationale for genetic testing. To be scheduled for genetic counseling with Steele Berg.  Patient does not want to wait for results of genetic testing to have surgery.  States her son I getting married in July, and she does not want to delay.

## 2018-02-14 NOTE — Progress Notes (Signed)
New patient in for breast neoplasm, DCIS.  Pt with history of breast cancer on maternal side of family.  Pt states "feeling awful for a week".  Pt reports having head and chest congestion and "I hurt all over".

## 2018-02-15 ENCOUNTER — Other Ambulatory Visit: Payer: Self-pay

## 2018-02-15 ENCOUNTER — Encounter
Admission: RE | Admit: 2018-02-15 | Discharge: 2018-02-15 | Disposition: A | Discharge status: Home / Self Care | Payer: Medicare HMO | Source: Ambulatory Visit | Attending: General Surgery | Admitting: General Surgery

## 2018-02-15 ENCOUNTER — Telehealth: Payer: Self-pay | Admitting: Genetic Counselor

## 2018-02-15 HISTORY — DX: Anxiety disorder, unspecified: F41.9

## 2018-02-15 HISTORY — DX: Unspecified convulsions: R56.9

## 2018-02-15 HISTORY — DX: Malignant (primary) neoplasm, unspecified: C80.1

## 2018-02-15 HISTORY — DX: Dyspnea, unspecified: R06.00

## 2018-02-15 LAB — URINE CULTURE

## 2018-02-15 NOTE — Patient Instructions (Addendum)
Your procedure is scheduled on: 02-21-18 [facility] Report to [place] @ 7:45 AM  Remember: Instructions that are not followed completely may result in serious medical risk, up to and including death, or upon the discretion of your surgeon and anesthesiologist your surgery may need to be rescheduled.    _x___ 1. Do not eat food after midnight the night before your procedure.  NO GUM OR CANDY AFTER MIDNIGHT.  You may drink clear liquids up to 2 hours before you are scheduled to arrive at the hospital for your procedure.  Do not drink clear liquids within 2 hours of your scheduled arrival to the hospital.  Clear liquids include  --Water or Apple juice without pulp  --Clear carbohydrate beverage such as ClearFast or Gatorade  --Black Coffee or Clear Tea (No milk, no creamers, do not add anything to the coffee or Tea     __x__ 2. No Alcohol for 24 hours before or after surgery.   __x__3. No Smoking or e-cigarettes for 24 prior to surgery.  Do not use any chewable tobacco products for at least 6 hour prior to surgery   ____  4. Bring all medications with you on the day of surgery if instructed.    __x__ 5. Notify your doctor if there is any change in your medical condition     (cold, fever, infections).    x___6. On the morning of surgery brush your teeth with toothpaste and water.  You may rinse your mouth with mouth wash if you wish.  Do not swallow any toothpaste or mouthwash.   Do not wear jewelry, make-up, hairpins, clips or nail polish.  Do not wear lotions, powders, or perfumes. You may wear deodorant.  Do not shave 48 hours prior to surgery. Men may shave face and neck.  Do not bring valuables to the hospital.    [facility] is not responsible for any belongings or valuables.               Contacts, dentures or bridgework may not be worn into surgery.  Leave your suitcase in the car. After surgery it may be brought to your room.  For patients admitted to the hospital,  discharge time is determined by your treatment team.  _  Patients discharged the day of surgery will not be allowed to drive home.  You will need someone to drive you home and stay with you the night of your procedure.    Please read over the following fact sheets that you were given:   [facility] Preparing for Surgery and or MRSA Information   _x___ TAKE THE FOLLOWING MEDICATION THE MORNING OF SURGERY WITH A SMALL SIP OF WATER. These include:  1. OMEPRAZOLE (PRILOSEC)  2. TAKE AN EXTRA OMEPRAZOLE THE NIGHT BEFORE YOUR SURGERY  3.  4.  5.  6.  ____Fleets enema or Magnesium Citrate as directed.   _x___ Use CHG Soap or sage wipes as directed on instruction sheet   _X___ Use inhalers on the day of surgery-USE YOUR ADVAIR INHALER AM OF SURGERY  ____ Stop Metformin and Janumet 2 days prior to surgery.    ____ Take 1/2 of usual insulin dose the night before surgery and none on the morning surgery.   _x___ Follow recommendations from Cardiologist, Pulmonologist or PCP regarding stopping Aspirin, Coumadin, Plavix ,Eliquis, Effient, or Pradaxa, and Pletal-PT STOPPED ASPIRIN ON 02-13-18  X____Stop Anti-inflammatories such as Advil, Aleve, Ibuprofen, Motrin, Naproxen, Naprosyn, Goodies powders,EXCEDRIN or aspirin products. OK to take  Tylenol OR TRAMADOL IF NEEDED   ____ Stop supplements until after surgery.     __X__ Bring C-Pap to the hospital.

## 2018-02-15 NOTE — Telephone Encounter (Signed)
Dr. Tasia Catchings is referring Michelle Moore for genetic counseling due to a personal and family history of cancer. I left her a message to call and schedule this telegenetics visit to be done by phone at her convenience.   Steele Berg, [place], [place] Genetic Counselor Phone: [phone]

## 2018-02-16 ENCOUNTER — Encounter
Admission: RE | Admit: 2018-02-16 | Discharge: 2018-02-16 | Disposition: A | Discharge status: Home / Self Care | Payer: Medicare HMO | Source: Ambulatory Visit | Attending: General Surgery | Admitting: General Surgery

## 2018-02-16 DIAGNOSIS — M9902 Segmental and somatic dysfunction of thoracic region: Secondary | ICD-10-CM | POA: Diagnosis not present

## 2018-02-16 DIAGNOSIS — Z01810 Encounter for preprocedural cardiovascular examination: Secondary | ICD-10-CM | POA: Diagnosis not present

## 2018-02-16 DIAGNOSIS — I252 Old myocardial infarction: Secondary | ICD-10-CM | POA: Diagnosis not present

## 2018-02-16 DIAGNOSIS — M546 Pain in thoracic spine: Secondary | ICD-10-CM | POA: Diagnosis not present

## 2018-02-16 DIAGNOSIS — M9905 Segmental and somatic dysfunction of pelvic region: Secondary | ICD-10-CM | POA: Diagnosis not present

## 2018-02-16 DIAGNOSIS — M9903 Segmental and somatic dysfunction of lumbar region: Secondary | ICD-10-CM | POA: Diagnosis not present

## 2018-02-16 DIAGNOSIS — M5442 Lumbago with sciatica, left side: Secondary | ICD-10-CM | POA: Diagnosis not present

## 2018-02-16 DIAGNOSIS — M5136 Other intervertebral disc degeneration, lumbar region: Secondary | ICD-10-CM | POA: Diagnosis not present

## 2018-02-19 DIAGNOSIS — M5442 Lumbago with sciatica, left side: Secondary | ICD-10-CM | POA: Diagnosis not present

## 2018-02-19 DIAGNOSIS — M9903 Segmental and somatic dysfunction of lumbar region: Secondary | ICD-10-CM | POA: Diagnosis not present

## 2018-02-19 DIAGNOSIS — M546 Pain in thoracic spine: Secondary | ICD-10-CM | POA: Diagnosis not present

## 2018-02-19 DIAGNOSIS — M5136 Other intervertebral disc degeneration, lumbar region: Secondary | ICD-10-CM | POA: Diagnosis not present

## 2018-02-19 DIAGNOSIS — M9905 Segmental and somatic dysfunction of pelvic region: Secondary | ICD-10-CM | POA: Diagnosis not present

## 2018-02-19 DIAGNOSIS — M9902 Segmental and somatic dysfunction of thoracic region: Secondary | ICD-10-CM | POA: Diagnosis not present

## 2018-02-19 LAB — CULTURE, BLOOD (ROUTINE X 2)
Culture: NO GROWTH
Culture: NO GROWTH

## 2018-02-20 ENCOUNTER — Encounter: Payer: Self-pay | Admitting: *Deleted

## 2018-02-20 MED ORDER — CEFAZOLIN SODIUM-DEXTROSE 2-4 GM/100ML-% IV SOLN
2.0000 g | INTRAVENOUS | Status: AC
  Administered 2018-02-21: 2 g via INTRAVENOUS

## 2018-02-20 NOTE — Pre-Procedure Instructions (Signed)
MEDICAL CLEARANCE ON CHART LOW RISK

## 2018-02-21 ENCOUNTER — Ambulatory Visit
Admission: RE | Admit: 2018-02-21 | Discharge: 2018-02-21 | Disposition: A | Discharge status: Home / Self Care | Payer: Medicare HMO | Source: Ambulatory Visit | Attending: General Surgery | Admitting: General Surgery

## 2018-02-21 ENCOUNTER — Encounter
Admission: RE | Disposition: A | Discharge status: Home / Self Care | Payer: Self-pay | Source: Ambulatory Visit | Attending: General Surgery

## 2018-02-21 ENCOUNTER — Other Ambulatory Visit: Payer: Self-pay

## 2018-02-21 ENCOUNTER — Encounter: Payer: Self-pay | Admitting: *Deleted

## 2018-02-21 ENCOUNTER — Ambulatory Visit: Payer: Medicare HMO | Admitting: Anesthesiology

## 2018-02-21 DIAGNOSIS — Z7982 Long term (current) use of aspirin: Secondary | ICD-10-CM | POA: Diagnosis not present

## 2018-02-21 DIAGNOSIS — G4730 Sleep apnea, unspecified: Secondary | ICD-10-CM | POA: Diagnosis not present

## 2018-02-21 DIAGNOSIS — D0511 Intraductal carcinoma in situ of right breast: Secondary | ICD-10-CM | POA: Diagnosis not present

## 2018-02-21 DIAGNOSIS — C50411 Malignant neoplasm of upper-outer quadrant of right female breast: Secondary | ICD-10-CM

## 2018-02-21 DIAGNOSIS — Z955 Presence of coronary angioplasty implant and graft: Secondary | ICD-10-CM | POA: Diagnosis not present

## 2018-02-21 DIAGNOSIS — J449 Chronic obstructive pulmonary disease, unspecified: Secondary | ICD-10-CM | POA: Diagnosis not present

## 2018-02-21 DIAGNOSIS — Z79899 Other long term (current) drug therapy: Secondary | ICD-10-CM | POA: Diagnosis not present

## 2018-02-21 DIAGNOSIS — E782 Mixed hyperlipidemia: Secondary | ICD-10-CM | POA: Diagnosis not present

## 2018-02-21 DIAGNOSIS — Z7951 Long term (current) use of inhaled steroids: Secondary | ICD-10-CM | POA: Insufficient documentation

## 2018-02-21 DIAGNOSIS — C50911 Malignant neoplasm of unspecified site of right female breast: Secondary | ICD-10-CM | POA: Diagnosis not present

## 2018-02-21 DIAGNOSIS — Z7952 Long term (current) use of systemic steroids: Secondary | ICD-10-CM | POA: Insufficient documentation

## 2018-02-21 DIAGNOSIS — Z9989 Dependence on other enabling machines and devices: Secondary | ICD-10-CM | POA: Insufficient documentation

## 2018-02-21 DIAGNOSIS — I252 Old myocardial infarction: Secondary | ICD-10-CM | POA: Insufficient documentation

## 2018-02-21 DIAGNOSIS — Z803 Family history of malignant neoplasm of breast: Secondary | ICD-10-CM | POA: Insufficient documentation

## 2018-02-21 DIAGNOSIS — D0512 Intraductal carcinoma in situ of left breast: Secondary | ICD-10-CM | POA: Diagnosis not present

## 2018-02-21 DIAGNOSIS — Z87891 Personal history of nicotine dependence: Secondary | ICD-10-CM | POA: Insufficient documentation

## 2018-02-21 DIAGNOSIS — F419 Anxiety disorder, unspecified: Secondary | ICD-10-CM | POA: Diagnosis not present

## 2018-02-21 DIAGNOSIS — K219 Gastro-esophageal reflux disease without esophagitis: Secondary | ICD-10-CM | POA: Insufficient documentation

## 2018-02-21 DIAGNOSIS — I2510 Atherosclerotic heart disease of native coronary artery without angina pectoris: Secondary | ICD-10-CM | POA: Insufficient documentation

## 2018-02-21 DIAGNOSIS — I739 Peripheral vascular disease, unspecified: Secondary | ICD-10-CM | POA: Diagnosis not present

## 2018-02-21 HISTORY — PX: PARTIAL MASTECTOMY WITH NEEDLE LOCALIZATION: SHX6008

## 2018-02-21 SURGERY — PARTIAL MASTECTOMY WITH NEEDLE LOCALIZATION
Anesthesia: General | Site: Breast | Laterality: Right | Wound class: Clean

## 2018-02-21 MED ORDER — MIDAZOLAM HCL 2 MG/2ML IJ SOLN
INTRAMUSCULAR | Status: DC | PRN
  Administered 2018-02-21: 1 mg via INTRAVENOUS
  Administered 2018-02-21: 2 mg via INTRAVENOUS

## 2018-02-21 MED ORDER — IPRATROPIUM-ALBUTEROL 0.5-2.5 (3) MG/3ML IN SOLN
3.0000 mL | Freq: Once | RESPIRATORY_TRACT | Status: AC
  Administered 2018-02-21: 3 mL via RESPIRATORY_TRACT

## 2018-02-21 MED ORDER — ACETAMINOPHEN 10 MG/ML IV SOLN
INTRAVENOUS | Status: AC
  Filled 2018-02-21: qty 100

## 2018-02-21 MED ORDER — ONDANSETRON HCL 4 MG/2ML IJ SOLN
INTRAMUSCULAR | Status: AC
Start: 2018-02-21 — End: ?
  Filled 2018-02-21: qty 2

## 2018-02-21 MED ORDER — MIDAZOLAM HCL 2 MG/2ML IJ SOLN
INTRAMUSCULAR | Status: AC
  Filled 2018-02-21: qty 2

## 2018-02-21 MED ORDER — DEXAMETHASONE SODIUM PHOSPHATE 10 MG/ML IJ SOLN
INTRAMUSCULAR | Status: DC | PRN
  Administered 2018-02-21: 5 mg via INTRAVENOUS

## 2018-02-21 MED ORDER — FENTANYL CITRATE (PF) 100 MCG/2ML IJ SOLN
INTRAMUSCULAR | Status: DC | PRN
  Administered 2018-02-21 (×4): 25 ug via INTRAVENOUS

## 2018-02-21 MED ORDER — BUPIVACAINE-EPINEPHRINE (PF) 0.5% -1:200000 IJ SOLN
INTRAMUSCULAR | Status: AC
Start: 2018-02-21 — End: ?
  Filled 2018-02-21: qty 30

## 2018-02-21 MED ORDER — ACETAMINOPHEN 10 MG/ML IV SOLN
INTRAVENOUS | Status: DC | PRN
  Administered 2018-02-21: 1000 mg via INTRAVENOUS

## 2018-02-21 MED ORDER — ONDANSETRON HCL 4 MG/2ML IJ SOLN: 4.0000 mg | Freq: Once | INTRAMUSCULAR | Status: DC | PRN

## 2018-02-21 MED ORDER — GLYCOPYRROLATE 0.2 MG/ML IJ SOLN
INTRAMUSCULAR | Status: AC
  Filled 2018-02-21: qty 1

## 2018-02-21 MED ORDER — PHENYLEPHRINE HCL 10 MG/ML IJ SOLN
INTRAMUSCULAR | Status: DC | PRN
  Administered 2018-02-21 (×6): 100 ug via INTRAVENOUS

## 2018-02-21 MED ORDER — PROPOFOL 10 MG/ML IV BOLUS
INTRAVENOUS | Status: DC | PRN
  Administered 2018-02-21: 20 mg via INTRAVENOUS
  Administered 2018-02-21: 150 mg via INTRAVENOUS

## 2018-02-21 MED ORDER — BUPIVACAINE-EPINEPHRINE 0.5% -1:200000 IJ SOLN
INTRAMUSCULAR | Status: DC | PRN
  Administered 2018-02-21: 30 mL

## 2018-02-21 MED ORDER — FENTANYL CITRATE (PF) 100 MCG/2ML IJ SOLN: 25.0000 ug | INTRAMUSCULAR | Status: DC | PRN

## 2018-02-21 MED ORDER — CEFAZOLIN SODIUM-DEXTROSE 2-4 GM/100ML-% IV SOLN
INTRAVENOUS | Status: AC
  Filled 2018-02-21: qty 100

## 2018-02-21 MED ORDER — DEXAMETHASONE SODIUM PHOSPHATE 10 MG/ML IJ SOLN
INTRAMUSCULAR | Status: AC
  Filled 2018-02-21: qty 1

## 2018-02-21 MED ORDER — FENTANYL CITRATE (PF) 100 MCG/2ML IJ SOLN
INTRAMUSCULAR | Status: AC
  Filled 2018-02-21: qty 2

## 2018-02-21 MED ORDER — IPRATROPIUM-ALBUTEROL 0.5-2.5 (3) MG/3ML IN SOLN
RESPIRATORY_TRACT | Status: AC
  Filled 2018-02-21: qty 3

## 2018-02-21 MED ORDER — EPHEDRINE SULFATE 50 MG/ML IJ SOLN
INTRAMUSCULAR | Status: AC
  Filled 2018-02-21: qty 1

## 2018-02-21 MED ORDER — ONDANSETRON HCL 4 MG/2ML IJ SOLN
INTRAMUSCULAR | Status: DC | PRN
  Administered 2018-02-21: 4 mg via INTRAVENOUS

## 2018-02-21 MED ORDER — EPHEDRINE SULFATE 50 MG/ML IJ SOLN
INTRAMUSCULAR | Status: DC | PRN
  Administered 2018-02-21 (×5): 5 mg via INTRAVENOUS

## 2018-02-21 MED ORDER — LIDOCAINE HCL (CARDIAC) PF 100 MG/5ML IV SOSY
PREFILLED_SYRINGE | INTRAVENOUS | Status: DC | PRN
  Administered 2018-02-21: 60 mg via INTRAVENOUS

## 2018-02-21 MED ORDER — LACTATED RINGERS IV SOLN
INTRAVENOUS | Status: DC | PRN
  Administered 2018-02-21: 09:00:00 via INTRAVENOUS

## 2018-02-21 MED ORDER — GLYCOPYRROLATE 0.2 MG/ML IJ SOLN
INTRAMUSCULAR | Status: DC | PRN
  Administered 2018-02-21 (×2): 0.1 mg via INTRAVENOUS

## 2018-02-21 MED ORDER — HYDROCODONE-ACETAMINOPHEN 5-325 MG PO TABS: 1.0000 | ORAL_TABLET | ORAL | 0 refills | Status: AC | PRN

## 2018-02-21 SURGICAL SUPPLY — 34 items
CANISTER SUCT 1200ML W/VALVE (MISCELLANEOUS) ×2 IMPLANT
CHLORAPREP W/TINT 26ML (MISCELLANEOUS) ×2 IMPLANT
CNTNR SPEC 2.5X3XGRAD LEK (MISCELLANEOUS) ×1
CONT SPEC 4OZ STER OR WHT (MISCELLANEOUS) ×1
CONTAINER SPEC 2.5X3XGRAD LEK (MISCELLANEOUS) ×1 IMPLANT
DERMABOND ADVANCED (GAUZE/BANDAGES/DRESSINGS) ×1
DERMABOND ADVANCED .7 DNX12 (GAUZE/BANDAGES/DRESSINGS) ×1 IMPLANT
DEVICE DUBIN SPECIMEN MAMMOGRA (MISCELLANEOUS) ×2 IMPLANT
DRAPE LAPAROTOMY 77X122 PED (DRAPES) ×2 IMPLANT
ELECT REM PT RETURN 9FT ADLT (ELECTROSURGICAL) ×2
ELECTRODE REM PT RTRN 9FT ADLT (ELECTROSURGICAL) ×1 IMPLANT
GAUZE SPONGE 4X4 16PLY XRAY LF (GAUZE/BANDAGES/DRESSINGS) ×2 IMPLANT
GLOVE BIO SURGEON STRL SZ 6.5 (GLOVE) ×4 IMPLANT
GOWN STRL REUS W/ TWL LRG LVL3 (GOWN DISPOSABLE) ×3 IMPLANT
GOWN STRL REUS W/TWL LRG LVL3 (GOWN DISPOSABLE) ×3
KIT TURNOVER KIT A (KITS) ×2 IMPLANT
LABEL OR SOLS (LABEL) ×2 IMPLANT
MARGIN MAP 10MM (MISCELLANEOUS) ×2 IMPLANT
NEEDLE HYPO 25X1 1.5 SAFETY (NEEDLE) ×2 IMPLANT
PACK BASIN MINOR ARMC (MISCELLANEOUS) ×2 IMPLANT
RETRACTOR WOUND ALXS 18CM SML (MISCELLANEOUS) ×1 IMPLANT
RTRCTR WOUND ALEXIS O 18CM SML (MISCELLANEOUS) ×2
SLEVE PROBE SENORX GAMMA FIND (MISCELLANEOUS) ×2 IMPLANT
SUT ETHILON 3-0 FS-10 30 BLK (SUTURE) ×2
SUT MNCRL 4-0 (SUTURE) ×1
SUT MNCRL 4-0 27XMFL (SUTURE) ×1
SUT PROLENE 2 0 SH DA (SUTURE) ×2 IMPLANT
SUT SILK 2 0 SH (SUTURE) ×2 IMPLANT
SUT VIC AB 3-0 SH 27 (SUTURE) ×2
SUT VIC AB 3-0 SH 27X BRD (SUTURE) ×2 IMPLANT
SUTURE EHLN 3-0 FS-10 30 BLK (SUTURE) ×1 IMPLANT
SUTURE MNCRL 4-0 27XMF (SUTURE) ×1 IMPLANT
SYR 10ML LL (SYRINGE) ×2 IMPLANT
WATER STERILE IRR 1000ML POUR (IV SOLUTION) ×2 IMPLANT

## 2018-02-21 NOTE — Anesthesia Post-op Follow-up Note (Signed)
Anesthesia QCDR form completed.        

## 2018-02-21 NOTE — Transfer of Care (Deleted)
Immediate Anesthesia Transfer of Care Note  Patient: Michelle Moore  Procedure(s) Performed: PARTIAL MASTECTOMY WITH NEEDLE LOCALIZATION (Right )  Patient Location: PACU  Anesthesia Type:General  Level of Consciousness: awake, alert  and oriented  Airway & Oxygen Therapy: Patient Spontanous Breathing and Patient connected to face mask oxygen  Post-op Assessment: Report given to RN and Post -op Vital signs reviewed and stable  Post vital signs: Reviewed and stable  Last Vitals:  Vitals Value Taken Time  BP 141/59 02/21/2018  9:31 AM  Temp 36.2 C 02/21/2018  9:31 AM  Pulse 67 02/21/2018  9:31 AM  Resp 11 02/21/2018  9:31 AM  SpO2 100 % 02/21/2018  9:31 AM    Last Pain:  Vitals:   02/21/18 0905  TempSrc: Tympanic  PainSc: 5          Complications: No apparent anesthesia complications

## 2018-02-21 NOTE — Discharge Instructions (Addendum)
°  Diet: Resume home heart healthy regular diet.   Activity: No heavy lifting >20 pounds (children, pets, laundry, garbage) or strenuous activity until follow-up, but light activity and walking are encouraged. Do not drive or drink alcohol if taking narcotic pain medications.  Wound care: May shower with soapy water and pat dry (do not rub incisions), but no baths or submerging incision underwater until follow-up. (no swimming)   Medications: Resume all home medications. For mild to moderate pain: acetaminophen (Tylenol) or ibuprofen (if no kidney disease). Combining Tylenol with alcohol can substantially increase your risk of causing liver disease. Narcotic pain medications, if prescribed, can be used for severe pain, though may cause nausea, constipation, and drowsiness. Do not combine Tylenol and Noroc within a 6 hour period as Norco contains Tylenol. If you do not need the narcotic pain medication, you do not need to fill the prescription.  Call office [phone]) at any time if any questions, worsening pain, fevers/chills, bleeding, drainage from incision site, or other concerns.    AMBULATORY SURGERY  DISCHARGE INSTRUCTIONS   1) The drugs that you were given will stay in your system until tomorrow so for the next 24 hours you should not:  A) Drive an automobile B) Make any legal decisions C) Drink any alcoholic beverage   2) You may resume regular meals tomorrow.  Today it is better to start with liquids and gradually work up to solid foods.  You may eat anything you prefer, but it is better to start with liquids, then soup and crackers, and gradually work up to solid foods.   3) Please notify your doctor immediately if you have any unusual bleeding, trouble breathing, redness and pain at the surgery site, drainage, fever, or pain not relieved by medication.    4) Additional Instructions:        Please contact your physician with any problems or Same Day Surgery at  [phone], Monday through Friday 6 am to 4 pm, or  at [facility] number at [phone].

## 2018-02-21 NOTE — Anesthesia Preprocedure Evaluation (Signed)
Anesthesia Evaluation  Patient identified by MRN, date of birth, ID band Patient awake    Reviewed: Allergy & Precautions, NPO status , Patient's Chart, lab work & pertinent test results  History of Anesthesia Complications Negative for: history of anesthetic complications  Airway Mallampati: III       Dental   Pulmonary sleep apnea and Continuous Positive Airway Pressure Ventilation , COPD, former smoker,           Cardiovascular (-) hypertension+ CAD, + Past MI, + Cardiac Stents and + Peripheral Vascular Disease  (-) CHF (-) dysrhythmias (-) Valvular Problems/Murmurs     Neuro/Psych Seizures - (one time, no meds, unknown etiology),  Anxiety    GI/Hepatic Neg liver ROS, GERD  Medicated and Controlled,  Endo/Other  neg diabetes  Renal/GU negative Renal ROS     Musculoskeletal   Abdominal   Peds  Hematology   Anesthesia Other Findings   Reproductive/Obstetrics                             Anesthesia Physical Anesthesia Plan  ASA: III  Anesthesia Plan: General   Post-op Pain Management:    Induction: Intravenous  PONV Risk Score and Plan: 3 and Dexamethasone, Ondansetron and Midazolam  Airway Management Planned: LMA  Additional Equipment:   Intra-op Plan:   Post-operative Plan:   Informed Consent: I have reviewed the patients History and Physical, chart, labs and discussed the procedure including the risks, benefits and alternatives for the proposed anesthesia with the patient or authorized representative who has indicated his/her understanding and acceptance.     Plan Discussed with:   Anesthesia Plan Comments:         Anesthesia Quick Evaluation

## 2018-02-21 NOTE — Transfer of Care (Signed)
Immediate Anesthesia Transfer of Care Note  Patient: Michelle Moore  Procedure(s) Performed: PARTIAL MASTECTOMY WITH NEEDLE LOCALIZATION (Right Breast)  Patient Location: PACU  Anesthesia Type:General  Level of Consciousness: sedated  Airway & Oxygen Therapy: Patient Spontanous Breathing and Patient connected to face mask oxygen  Post-op Assessment: Report given to RN and Post -op Vital signs reviewed and stable  Post vital signs: Reviewed and stable  Last Vitals:  Vitals Value Taken Time  BP 161/82 02/21/2018 12:05 PM  Temp 36.7 C 02/21/2018 12:05 PM  Pulse 107 02/21/2018 12:06 PM  Resp 21 02/21/2018 12:06 PM  SpO2 98 % 02/21/2018 12:06 PM  Vitals shown include unvalidated device data.  Last Pain:  Vitals:   02/21/18 1205  TempSrc: Temporal  PainSc:          Complications: No apparent anesthesia complications

## 2018-02-21 NOTE — Brief Op Note (Signed)
02/21/2018  12:09 PM  PATIENT:  Michelle Moore  62 y.o. female  PRE-OPERATIVE DIAGNOSIS:  malignant neoplasm of right breast  POST-OPERATIVE DIAGNOSIS:  malignant neoplasm of right breast  PROCEDURE:  Procedure(s): PARTIAL MASTECTOMY WITH NEEDLE LOCALIZATION (Right)  SURGEON:  Surgeon(s) and Role:    Herbert Pun, MD - Primary  ANESTHESIA:   local and general  EBL:  25 mL

## 2018-02-21 NOTE — Anesthesia Procedure Notes (Signed)
Procedure Name: LMA Insertion Date/Time: 02/21/2018 10:20 AM Performed by: Hedda Slade, CRNA Pre-anesthesia Checklist: Patient identified, Patient being monitored, Timeout performed, Emergency Drugs available and Suction available Patient Re-evaluated:Patient Re-evaluated prior to induction Oxygen Delivery Method: Circle system utilized Preoxygenation: Pre-oxygenation with 100% oxygen Induction Type: IV induction Ventilation: Mask ventilation without difficulty LMA: LMA inserted LMA Size: 3.5 Tube type: Oral Number of attempts: 1 Placement Confirmation: positive ETCO2 and breath sounds checked- equal and bilateral Tube secured with: Tape Dental Injury: Teeth and Oropharynx as per pre-operative assessment

## 2018-02-21 NOTE — Op Note (Signed)
Preoperative diagnosis: Right breast DCIS.  Postoperative diagnosis: Right breast DCIS.   Procedure: Right needle-localized breast partial mastectomy.                    Anesthesia: GETA and local  Surgeon: Dr. Windell Moment  Wound Classification: Clean  Indications: Patient is a 62 y.o. female with a nonpalpable right breast mass noted on mammography with core biopsy demonstrating DCIS requires needle-localized lumpectomy for treatment.   Findings: 1. Specimen mammography shows marker and wire on specimen 2. Pathology call refers gross examination of margins cannot be assessed because there is no discrete mass. 3. No other palpable mass or lymph node identified.   Description of procedure: Preoperative needle localization was performed by radiology. Localization studies were reviewed. The patient was taken to the operating room and placed supine on the operating table, and after general anesthesia the right chest was prepped and draped in the usual sterile fashion. A time-out was completed verifying correct patient, procedure, site, positioning, and implant(s) and/or special equipment prior to beginning this procedure.  By comparing the localization studies with the direction and skin entry site of the needle, the probable trajectory and location of the mass was visualized. A circumareolar elliptical skin incision including the two wires was planned in such a way as to minimize the amount of dissection to reach the mass.  The skin incision was made. Flaps were raised and the location of the wire confirmed. The wire was delivered into the wound. A 2-0 silk figure-of-eight stay suture was placed around the wire and used for retraction. Dissection was then taken down circumferentially, taking care to include the entire localizing needle and a wide margin of grossly normal tissue. The specimen and entire localizing wire were removed. The specimen was oriented and sent to radiology with the  localization studies. Confirmation was received that the entire target lesion had been resected but the medial wire was close. Since pathology cannot assess the gross examination of margins since there is no mass, the medial border was re excised due to the proximity to the wire. The wound was irrigated. Hemostasis was checked. The wound was closed with interrupted sutures of 3-0 Vicryl and a subcuticular suture of Monocryl 3-0. No attempt was made to close the dead space. A dressing was applied.   Specimen: Right Breast mass (Orientation markers used: Cranial, Caudal, Medial, Lateral, Deep)                   Complications: None  Estimated Blood Loss: 25 mL

## 2018-02-21 NOTE — Telephone Encounter (Signed)
Michelle Moore called back and opted to schedule an appointment on Thursday, 02/22/18 at 11am.

## 2018-02-21 NOTE — Telephone Encounter (Signed)
2nd message left for Michelle Moore to call for genetic counseling phone appointment.

## 2018-02-21 NOTE — Interval H&P Note (Signed)
History and Physical Interval Note:  02/21/2018 9:58 AM  Michelle Moore  has presented today for surgery, with the diagnosis of malignant neoplasm of right breast  The various methods of treatment have been discussed with the patient and family. After consideration of risks, benefits and other options for treatment, the patient has consented to  Procedure(s): PARTIAL MASTECTOMY WITH NEEDLE LOCALIZATION (Right) as a surgical intervention .  The patient's history has been reviewed, patient examined, no change in status, stable for surgery.  I have reviewed the patient's chart and labs.  Right breast marked in the pre procedure room.  Questions were answered to the patient's satisfaction.     Herbert Pun

## 2018-02-22 ENCOUNTER — Telehealth: Payer: Self-pay | Admitting: Genetic Counselor

## 2018-02-22 ENCOUNTER — Encounter: Payer: Self-pay | Admitting: Genetic Counselor

## 2018-02-22 NOTE — Anesthesia Postprocedure Evaluation (Signed)
Anesthesia Post Note  Patient: Michelle Moore  Procedure(s) Performed: PARTIAL MASTECTOMY WITH NEEDLE LOCALIZATION (Right Breast)  Patient location during evaluation: PACU Anesthesia Type: General Level of consciousness: awake and alert Pain management: pain level controlled Vital Signs Assessment: post-procedure vital signs reviewed and stable Respiratory status: spontaneous breathing and respiratory function stable Cardiovascular status: stable Anesthetic complications: no     Last Vitals:  Vitals:   02/21/18 1253 02/21/18 1302  BP:  117/67  Pulse:  83  Resp:  16  Temp: 36.9 C 36.7 C  SpO2:  96%    Last Pain:  Vitals:   02/22/18 0921  TempSrc:   PainSc: 2                  Michelle Moore

## 2018-02-22 NOTE — Telephone Encounter (Signed)
Cancer Genetics            Telegenetics Initial Visit    Patient Name: Michelle Moore Patient DOB: 1956/07/27 Patient Age: 62 y.o. Phone Call Date: 02/22/2018  Referring Provider: Earlie Server, MD  Reason for Visit: Evaluate for hereditary susceptibility to cancer    Assessment and Plan:  . Michelle Moore' personal history of breast cancer at age 65 in combination with her family history is not suggestive of a hereditary predisposition to cancer. She learned more information about her family history since her initial clinic visit and stated that she did not have maternal aunts with breast cancer. See family history below.  . Testing for hereditary cancer risk is available, but her discussed her risk is fairly low to have a mutation.   . Michelle Moore did not wish to pursue genetic testing today. Given her risk level, this is reasonable.  . Michelle Moore is encouraged to remain in contact with Cancer Genetics annually so that we can update the family history and inform her of any changes in cancer genetics and testing that may be of benefit for this family.    Dr. Grayland Moore was available for questions concerning this case. Total time spent by counseling by phone was approximately 20 minutes.   _____________________________________________________________________   History of Present Illness: Michelle Moore, a 62 y.o. female, was referred for genetic counseling to discuss the possibility of a hereditary predisposition to cancer and discuss whether genetic testing is warranted. This was a telegenetics visit via phone.  Michelle Moore was recently diagnosed with breast cancer (DCIS) at the age of 75. She is s/p lumpectomy.  ER and PR receptors pending on the surgical specimen.  She reported a hysterectomy in her 30s due to fibroids, but stated her ovaries remain intact. She reported a history of a couple of colon polyps removed about 10 years ago, but has not had any follow-up colon  screenings since.  Past Medical History:  Diagnosis Date  . Anxiety   . Arthritis   . Cancer ([place])   . COPD (chronic obstructive pulmonary disease) ([place])   . Dyspnea    ONLY WITH EXERTION  . GERD (gastroesophageal reflux disease)    "sometimes"  . Headache(784.0)    sinus headaches  . Myocardial infarction ([place])    in 2011  no damage  . Seizures ([place]) 2014   X1  . Sleep apnea    USES CPAP    Past Surgical History:  Procedure Laterality Date  . ABDOMINAL HYSTERECTOMY    . ANTERIOR CERVICAL DECOMP/DISCECTOMY FUSION N/A 01/20/2014   Procedure: ANTERIOR CERVICAL DECOMPRESSION/DISCECTOMY FUSION 2 LEVELS cervical five/six six Tarry Moore;  Surgeon: Ophelia Charter, MD;  Location: [place] [facility];  Service: Neurosurgery;  Laterality: N/A;  . BREAST BIOPSY Right 02/01/2018   x shape, path pending  . CARDIAC CATHETERIZATION     2011  . CORONARY ANGIOPLASTY WITH STENT PLACEMENT     PLACED IN RCA 2011  . EYE SURGERY     lasik  . PARTIAL MASTECTOMY WITH NEEDLE LOCALIZATION Right 02/21/2018   Procedure: PARTIAL MASTECTOMY WITH NEEDLE LOCALIZATION;  Surgeon: Herbert Pun, MD;  Location: [facility] ORS;  Service: General;  Laterality: Right;  . ROTATOR CUFF REPAIR     left shoulder  . TOE SURGERY Right    has pin and plate in it    Family History: Significant diagnoses include the following:  Family  History  Problem Relation Age of Onset  . Breast cancer Cousin 21       bilateral at 23 and 24; daughter of maternal aunt who was unaffected  . Lung cancer Maternal Aunt        dx 84s; deceased 56s; smoker  . Heart attack Father        deceased 66  . Stomach cancer Maternal Grandmother     Additionally, Michelle Moore has one son (age 58). She has 4 brothers (ages 69s-60s). Her mother (age 39) has no history of cancer. She did have a TAH/BSO in her 68s. Her mother had 4 sisters and 2 brothers. Only one sister had cancer (lung). Her father had 4 brothers and a sister.  Michelle Moore'  ancestry is Caucasian - NOS. There is no known Jewish ancestry and no consanguinity.  Discussion: We reviewed the characteristics, features and inheritance patterns of hereditary cancer syndromes. We discussed her risk of harboring a mutation in the context of her personal and family history. We discussed the process of genetic testing, insurance coverage and implications of results: positive, negative and variant of unknown significance (VUS).   Michelle Moore' questions were answered to her satisfaction today and she is welcome to call with any additional questions or concerns. Thank you for the referral and allowing [place] to share in the care of your patient.    Steele Berg, MS, [place] Certified Genetic Counselor phone: [phone]

## 2018-02-27 LAB — SURGICAL PATHOLOGY

## 2018-02-27 NOTE — Progress Notes (Signed)
  Oncology Nurse Navigator Documentation  Navigator Location: [facility] (02/27/18 1400)   )Navigator Encounter Type: Telephone (02/27/18 1400) Telephone: Lahoma Crocker Call;Appt Confirmation/Clarification (02/27/18 1400)                   Patient Visit Type: Follow-up (02/27/18 1400)   Barriers/Navigation Needs: Coordination of Care (02/27/18 1400)   Interventions: Coordination of Care (02/27/18 1400)   Coordination of Care: Appts (02/27/18 1400)                  Time Spent with Patient: 30 (02/27/18 1400)   Scheduled post-op visit with Dr. Grayland Ormond on 03/07/18.  Patient notified of appointment.

## 2018-02-28 DIAGNOSIS — M5136 Other intervertebral disc degeneration, lumbar region: Secondary | ICD-10-CM | POA: Diagnosis not present

## 2018-02-28 DIAGNOSIS — M9903 Segmental and somatic dysfunction of lumbar region: Secondary | ICD-10-CM | POA: Diagnosis not present

## 2018-02-28 DIAGNOSIS — M5442 Lumbago with sciatica, left side: Secondary | ICD-10-CM | POA: Diagnosis not present

## 2018-02-28 DIAGNOSIS — M9905 Segmental and somatic dysfunction of pelvic region: Secondary | ICD-10-CM | POA: Diagnosis not present

## 2018-02-28 DIAGNOSIS — M546 Pain in thoracic spine: Secondary | ICD-10-CM | POA: Diagnosis not present

## 2018-02-28 DIAGNOSIS — M9902 Segmental and somatic dysfunction of thoracic region: Secondary | ICD-10-CM | POA: Diagnosis not present

## 2018-03-01 ENCOUNTER — Other Ambulatory Visit: Payer: Self-pay | Admitting: *Deleted

## 2018-03-01 DIAGNOSIS — G4733 Obstructive sleep apnea (adult) (pediatric): Secondary | ICD-10-CM | POA: Diagnosis not present

## 2018-03-01 DIAGNOSIS — C50919 Malignant neoplasm of unspecified site of unspecified female breast: Secondary | ICD-10-CM

## 2018-03-01 NOTE — Progress Notes (Signed)
  Oncology Nurse Navigator Documentation  Navigator Location: [facility] (03/01/18 1500)   )Navigator Encounter Type: Telephone (03/01/18 1500) Telephone: Appt Confirmation/Clarification (03/01/18 1500)                   Patient Visit Type: Follow-up (03/01/18 1500)   Barriers/Navigation Needs: Coordination of Care;Education (03/01/18 1500)   Interventions: Coordination of Care (03/01/18 1500)   Coordination of Care: Appts (03/01/18 1500)                  Time Spent with Patient: 45 (03/01/18 1500)   Patient called on 02/28/18 .  States she talked to Dr. Windell Moment and he is planning re-excision for clear margins. Requests to reschedule Med/Onc follow-up.  Rescheduled with Dr. Grayland Ormond on 03/21/18 at 10:15.  Consult appointment scheduled with Dr. Baruch Gouty for 03/26/18 at 2:00.  Sherry at Dr. Windell Moment office to call patient with surgery information.

## 2018-03-06 ENCOUNTER — Encounter
Admission: RE | Admit: 2018-03-06 | Discharge: 2018-03-06 | Disposition: A | Discharge status: Home / Self Care | Payer: Medicare HMO | Source: Ambulatory Visit | Attending: General Surgery | Admitting: General Surgery

## 2018-03-06 ENCOUNTER — Ambulatory Visit: Payer: Self-pay | Admitting: General Surgery

## 2018-03-06 ENCOUNTER — Other Ambulatory Visit: Payer: Self-pay

## 2018-03-06 NOTE — Patient Instructions (Signed)
Your procedure is scheduled on: 03-07-18 Report to Same Day Surgery 2nd floor medical mall [facility] Entrance-take elevator on left to 2nd floor.  Check in with surgery information desk.) To find out your arrival time please call [phone] between 1PM - 3PM on  03-06-18  Remember: Instructions that are not followed completely may result in serious medical risk, up to and including death, or upon the discretion of your surgeon and anesthesiologist your surgery may need to be rescheduled.    _x___ 1. Do not eat food after midnight the night before your procedure. NO GUM OR CANDY AFTER MIDNIGHT.  You may drink clear liquids up to 2 hours before you are scheduled to arrive at the hospital for your procedure.  Do not drink clear liquids within 2 hours of your scheduled arrival to the hospital.  Clear liquids include  --Water or Apple juice without pulp  --Clear carbohydrate beverage such as ClearFast or Gatorade  --Black Coffee or Clear Tea (No milk, no creamers, do not add anything to the coffee or Tea     __x__ 2. No Alcohol for 24 hours before or after surgery.   __x__3. No Smoking or e-cigarettes for 24 prior to surgery.  Do not use any chewable tobacco products for at least 6 hour prior to surgery   ____  4. Bring all medications with you on the day of surgery if instructed.    __x__ 5. Notify your doctor if there is any change in your medical condition     (cold, fever, infections).    x___6. On the morning of surgery brush your teeth with toothpaste and water.  You may rinse your mouth with mouth wash if you wish.  Do not swallow any toothpaste or mouthwash.   Do not wear jewelry, make-up, hairpins, clips or nail polish.  Do not wear lotions, powders, or perfumes. You may wear deodorant.  Do not shave 48 hours prior to surgery. Men may shave face and neck.  Do not bring valuables to the hospital.    [facility] is not responsible for any belongings or valuables.     Contacts, dentures or bridgework may not be worn into surgery.  Leave your suitcase in the car. After surgery it may be brought to your room.  For patients admitted to the hospital, discharge time is determined by your treatment team.  _  Patients discharged the day of surgery will not be allowed to drive home.  You will need someone to drive you home and stay with you the night of your procedure.    Please read over the following fact sheets that you were given:   [facility] Preparing for Surgery  _x___ TAKE THE FOLLOWING MEDICATION THE MORNING OF SURGERY WITH A SMALL SIP OF WATER. These include:  1. OMEPRAZOLE  2. TAKE AN EXTRA OMEPRAZOLE BEFORE BED TONIGHT  3.  4.  5.  6.  ____Fleets enema or Magnesium Citrate as directed.   ____ Use CHG Soap or sage wipes as directed on instruction sheet   _X___ Use inhalers on the day of surgery-USE YOUR ADVAIR INHALER AT HOME AM OF SURGERY  ____ Stop Metformin and Janumet 2 days prior to surgery.    ____ Take 1/2 of usual insulin dose the night before surgery and none on the morning surgery.   _x___ Follow recommendations from Cardiologist, Pulmonologist or PCP regarding stopping Aspirin, Coumadin, Plavix ,Eliquis, Effient, or Pradaxa, and Pletal-PT STOPPED ASA LAST THURSDAY  X____Stop Anti-inflammatories such  as Advil, Aleve, Ibuprofen, Motrin, Naproxen, Naprosyn, Goodies powders or aspirin productsNOW- OK to take Tylenol    ____ Stop supplements until after surgery.   ____ Bring C-Pap to the hospital.

## 2018-03-07 ENCOUNTER — Ambulatory Visit: Payer: Medicare HMO | Admitting: Anesthesiology

## 2018-03-07 ENCOUNTER — Encounter
Admission: RE | Disposition: A | Discharge status: Home / Self Care | Payer: Self-pay | Source: Ambulatory Visit | Attending: General Surgery

## 2018-03-07 ENCOUNTER — Ambulatory Visit
Admission: RE | Admit: 2018-03-07 | Discharge: 2018-03-07 | Disposition: A | Discharge status: Home / Self Care | Payer: Medicare HMO | Source: Ambulatory Visit | Attending: General Surgery | Admitting: General Surgery

## 2018-03-07 ENCOUNTER — Ambulatory Visit: Payer: Medicare HMO | Admitting: Oncology

## 2018-03-07 DIAGNOSIS — Z79899 Other long term (current) drug therapy: Secondary | ICD-10-CM | POA: Diagnosis not present

## 2018-03-07 DIAGNOSIS — K219 Gastro-esophageal reflux disease without esophagitis: Secondary | ICD-10-CM | POA: Diagnosis not present

## 2018-03-07 DIAGNOSIS — C50411 Malignant neoplasm of upper-outer quadrant of right female breast: Secondary | ICD-10-CM | POA: Insufficient documentation

## 2018-03-07 DIAGNOSIS — I252 Old myocardial infarction: Secondary | ICD-10-CM | POA: Diagnosis not present

## 2018-03-07 DIAGNOSIS — J449 Chronic obstructive pulmonary disease, unspecified: Secondary | ICD-10-CM | POA: Diagnosis not present

## 2018-03-07 DIAGNOSIS — E782 Mixed hyperlipidemia: Secondary | ICD-10-CM | POA: Diagnosis not present

## 2018-03-07 DIAGNOSIS — I2510 Atherosclerotic heart disease of native coronary artery without angina pectoris: Secondary | ICD-10-CM | POA: Insufficient documentation

## 2018-03-07 DIAGNOSIS — Z87891 Personal history of nicotine dependence: Secondary | ICD-10-CM | POA: Insufficient documentation

## 2018-03-07 DIAGNOSIS — F329 Major depressive disorder, single episode, unspecified: Secondary | ICD-10-CM | POA: Diagnosis not present

## 2018-03-07 DIAGNOSIS — M1990 Unspecified osteoarthritis, unspecified site: Secondary | ICD-10-CM | POA: Diagnosis not present

## 2018-03-07 DIAGNOSIS — I10 Essential (primary) hypertension: Secondary | ICD-10-CM | POA: Insufficient documentation

## 2018-03-07 DIAGNOSIS — Z7982 Long term (current) use of aspirin: Secondary | ICD-10-CM | POA: Diagnosis not present

## 2018-03-07 DIAGNOSIS — M109 Gout, unspecified: Secondary | ICD-10-CM | POA: Diagnosis not present

## 2018-03-07 DIAGNOSIS — L905 Scar conditions and fibrosis of skin: Secondary | ICD-10-CM | POA: Diagnosis not present

## 2018-03-07 DIAGNOSIS — F419 Anxiety disorder, unspecified: Secondary | ICD-10-CM | POA: Diagnosis not present

## 2018-03-07 DIAGNOSIS — I739 Peripheral vascular disease, unspecified: Secondary | ICD-10-CM | POA: Diagnosis not present

## 2018-03-07 DIAGNOSIS — G4733 Obstructive sleep apnea (adult) (pediatric): Secondary | ICD-10-CM | POA: Diagnosis not present

## 2018-03-07 DIAGNOSIS — C50911 Malignant neoplasm of unspecified site of right female breast: Secondary | ICD-10-CM | POA: Diagnosis not present

## 2018-03-07 HISTORY — PX: BREAST LUMPECTOMY: SHX2

## 2018-03-07 SURGERY — BREAST LUMPECTOMY
Anesthesia: General | Site: Breast | Laterality: Right | Wound class: Clean

## 2018-03-07 MED ORDER — DEXAMETHASONE SODIUM PHOSPHATE 10 MG/ML IJ SOLN
INTRAMUSCULAR | Status: AC
  Filled 2018-03-07: qty 1

## 2018-03-07 MED ORDER — PHENYLEPHRINE HCL 10 MG/ML IJ SOLN
INTRAMUSCULAR | Status: DC | PRN
  Administered 2018-03-07: 100 ug via INTRAVENOUS

## 2018-03-07 MED ORDER — LIDOCAINE HCL (CARDIAC) PF 100 MG/5ML IV SOSY
PREFILLED_SYRINGE | INTRAVENOUS | Status: DC | PRN
  Administered 2018-03-07: 100 mg via INTRAVENOUS

## 2018-03-07 MED ORDER — ONDANSETRON HCL 4 MG/2ML IJ SOLN
INTRAMUSCULAR | Status: DC | PRN
  Administered 2018-03-07: 4 mg via INTRAVENOUS

## 2018-03-07 MED ORDER — LIDOCAINE HCL (PF) 2 % IJ SOLN
INTRAMUSCULAR | Status: AC
  Filled 2018-03-07: qty 10

## 2018-03-07 MED ORDER — SUGAMMADEX SODIUM 500 MG/5ML IV SOLN
INTRAVENOUS | Status: AC
  Filled 2018-03-07: qty 5

## 2018-03-07 MED ORDER — ACETAMINOPHEN 325 MG PO TABS
ORAL_TABLET | ORAL | Status: AC
  Administered 2018-03-07: 650 mg via ORAL
  Filled 2018-03-07: qty 2

## 2018-03-07 MED ORDER — FENTANYL CITRATE (PF) 100 MCG/2ML IJ SOLN: 25.0000 ug | INTRAMUSCULAR | Status: DC | PRN

## 2018-03-07 MED ORDER — BUPIVACAINE-EPINEPHRINE (PF) 0.5% -1:200000 IJ SOLN
INTRAMUSCULAR | Status: DC | PRN
  Administered 2018-03-07: 20 mL

## 2018-03-07 MED ORDER — PROMETHAZINE HCL 25 MG/ML IJ SOLN: 6.2500 mg | INTRAMUSCULAR | Status: DC | PRN

## 2018-03-07 MED ORDER — IPRATROPIUM-ALBUTEROL 0.5-2.5 (3) MG/3ML IN SOLN
RESPIRATORY_TRACT | Status: AC
  Filled 2018-03-07: qty 3

## 2018-03-07 MED ORDER — CEFAZOLIN SODIUM-DEXTROSE 2-4 GM/100ML-% IV SOLN
2.0000 g | INTRAVENOUS | Status: AC
  Administered 2018-03-07: 2 g via INTRAVENOUS

## 2018-03-07 MED ORDER — EPHEDRINE SULFATE 50 MG/ML IJ SOLN
INTRAMUSCULAR | Status: AC
Start: 2018-03-07 — End: ?
  Filled 2018-03-07: qty 1

## 2018-03-07 MED ORDER — IPRATROPIUM-ALBUTEROL 0.5-2.5 (3) MG/3ML IN SOLN
3.0000 mL | Freq: Once | RESPIRATORY_TRACT | Status: AC
  Administered 2018-03-07: 3 mL via RESPIRATORY_TRACT

## 2018-03-07 MED ORDER — LACTATED RINGERS IV SOLN
INTRAVENOUS | Status: DC
  Administered 2018-03-07: 13:00:00 via INTRAVENOUS

## 2018-03-07 MED ORDER — MIDAZOLAM HCL 2 MG/2ML IJ SOLN
INTRAMUSCULAR | Status: DC | PRN
  Administered 2018-03-07: 2 mg via INTRAVENOUS

## 2018-03-07 MED ORDER — PROPOFOL 500 MG/50ML IV EMUL
INTRAVENOUS | Status: AC
  Filled 2018-03-07: qty 50

## 2018-03-07 MED ORDER — FENTANYL CITRATE (PF) 100 MCG/2ML IJ SOLN
INTRAMUSCULAR | Status: DC | PRN
  Administered 2018-03-07 (×4): 25 ug via INTRAVENOUS

## 2018-03-07 MED ORDER — FENTANYL CITRATE (PF) 100 MCG/2ML IJ SOLN
INTRAMUSCULAR | Status: AC
  Filled 2018-03-07: qty 2

## 2018-03-07 MED ORDER — STERILE WATER FOR INJECTION IJ SOLN
INTRAMUSCULAR | Status: AC
  Filled 2018-03-07: qty 10

## 2018-03-07 MED ORDER — PROPOFOL 10 MG/ML IV BOLUS
INTRAVENOUS | Status: DC | PRN
  Administered 2018-03-07: 130 mg via INTRAVENOUS
  Administered 2018-03-07: 70 mg via INTRAVENOUS

## 2018-03-07 MED ORDER — DEXAMETHASONE SODIUM PHOSPHATE 10 MG/ML IJ SOLN
INTRAMUSCULAR | Status: DC | PRN
  Administered 2018-03-07: 10 mg via INTRAVENOUS

## 2018-03-07 MED ORDER — MEPERIDINE HCL 50 MG/ML IJ SOLN: 6.2500 mg | INTRAMUSCULAR | Status: DC | PRN

## 2018-03-07 MED ORDER — ACETAMINOPHEN 160 MG/5ML PO SOLN
325.0000 mg | ORAL | Status: DC | PRN
  Filled 2018-03-07: qty 20.3

## 2018-03-07 MED ORDER — HYDROCODONE-ACETAMINOPHEN 7.5-325 MG PO TABS: 1.0000 | ORAL_TABLET | Freq: Once | ORAL | Status: DC | PRN

## 2018-03-07 MED ORDER — MIDAZOLAM HCL 2 MG/2ML IJ SOLN
INTRAMUSCULAR | Status: AC
  Filled 2018-03-07: qty 2

## 2018-03-07 MED ORDER — ACETAMINOPHEN 325 MG PO TABS
325.0000 mg | ORAL_TABLET | ORAL | Status: DC | PRN
  Administered 2018-03-07: 650 mg via ORAL

## 2018-03-07 MED ORDER — ONDANSETRON HCL 4 MG/2ML IJ SOLN
INTRAMUSCULAR | Status: AC
  Filled 2018-03-07: qty 2

## 2018-03-07 MED ORDER — CEFAZOLIN SODIUM-DEXTROSE 2-4 GM/100ML-% IV SOLN
INTRAVENOUS | Status: AC
  Filled 2018-03-07: qty 100

## 2018-03-07 SURGICAL SUPPLY — 28 items
CANISTER SUCT 1200ML W/VALVE (MISCELLANEOUS) ×2 IMPLANT
CHLORAPREP W/TINT 26ML (MISCELLANEOUS) ×2 IMPLANT
CNTNR SPEC 2.5X3XGRAD LEK (MISCELLANEOUS) ×1
CONT SPEC 4OZ STER OR WHT (MISCELLANEOUS) ×1
CONTAINER SPEC 2.5X3XGRAD LEK (MISCELLANEOUS) ×1 IMPLANT
DERMABOND ADVANCED (GAUZE/BANDAGES/DRESSINGS) ×1
DERMABOND ADVANCED .7 DNX12 (GAUZE/BANDAGES/DRESSINGS) ×1 IMPLANT
DRAPE LAPAROTOMY 77X122 PED (DRAPES) ×2 IMPLANT
ELECT REM PT RETURN 9FT ADLT (ELECTROSURGICAL) ×2
ELECTRODE REM PT RTRN 9FT ADLT (ELECTROSURGICAL) ×1 IMPLANT
GLOVE BIO SURGEON STRL SZ 6.5 (GLOVE) ×2 IMPLANT
GOWN STRL REUS W/ TWL LRG LVL3 (GOWN DISPOSABLE) ×3 IMPLANT
GOWN STRL REUS W/TWL LRG LVL3 (GOWN DISPOSABLE) ×3
KIT TURNOVER KIT A (KITS) ×2 IMPLANT
LABEL OR SOLS (LABEL) ×2 IMPLANT
MARGIN MAP 10MM (MISCELLANEOUS) ×2 IMPLANT
NEEDLE HYPO 25X1 1.5 SAFETY (NEEDLE) ×4 IMPLANT
PACK BASIN MINOR ARMC (MISCELLANEOUS) ×2 IMPLANT
SUT ETHILON 3-0 FS-10 30 BLK (SUTURE) ×2
SUT MNCRL 4-0 (SUTURE) ×1
SUT MNCRL 4-0 27XMFL (SUTURE) ×1
SUT SILK 2 0 SH (SUTURE) ×2 IMPLANT
SUT VIC AB 3-0 SH 27 (SUTURE) ×1
SUT VIC AB 3-0 SH 27X BRD (SUTURE) ×1 IMPLANT
SUTURE EHLN 3-0 FS-10 30 BLK (SUTURE) ×1 IMPLANT
SUTURE MNCRL 4-0 27XMF (SUTURE) ×1 IMPLANT
SYR 10ML LL (SYRINGE) ×2 IMPLANT
WATER STERILE IRR 1000ML POUR (IV SOLUTION) ×2 IMPLANT

## 2018-03-07 NOTE — Anesthesia Preprocedure Evaluation (Addendum)
Anesthesia Evaluation  Patient identified by MRN, date of birth, ID band Patient awake    Reviewed: Allergy & Precautions, H&P , NPO status , reviewed documented beta blocker date and time   Airway Mallampati: III  TM Distance: >3 FB Neck ROM: full    Dental  (+) Chipped   Pulmonary shortness of breath, sleep apnea , COPD, former smoker,    Pulmonary exam normal        Cardiovascular + CAD, + Past MI and + Peripheral Vascular Disease  Normal cardiovascular exam     Neuro/Psych  Headaches, Seizures -,  PSYCHIATRIC DISORDERS Anxiety  Neuromuscular disease    GI/Hepatic GERD  Medicated and Controlled,  Endo/Other    Renal/GU      Musculoskeletal  (+) Arthritis ,   Abdominal   Peds  Hematology   Anesthesia Other Findings Past Medical History: No date: Anxiety No date: Arthritis No date: Cancer ([place]) No date: COPD (chronic obstructive pulmonary disease) ([facility]) No date: Dyspnea     Comment:  ONLY WITH EXERTION No date: GERD (gastroesophageal reflux disease)     Comment:  "sometimes" No date: Headache(784.0)     Comment:  sinus headaches No date: Myocardial infarction ([place])     Comment:  in 2011  no damage 2014: Seizures ([place])     Comment:  X1 No date: Sleep apnea     Comment:  USES CPAP Past Surgical History: No date: ABDOMINAL HYSTERECTOMY 01/20/2014: ANTERIOR CERVICAL DECOMP/DISCECTOMY FUSION; N/A     Comment:  Procedure: ANTERIOR CERVICAL DECOMPRESSION/DISCECTOMY               FUSION 2 LEVELS cervical five/six six Tarry Kos;  Surgeon:               Ophelia Charter, MD;  Location: [facility];  Service:              Neurosurgery;  Laterality: N/A; 02/01/2018: BREAST BIOPSY; Right     Comment:  x shape, path pending No date: CARDIAC CATHETERIZATION     Comment:  2011 No date: CORONARY ANGIOPLASTY WITH STENT PLACEMENT     Comment:  PLACED IN RCA 2011 No date: EYE SURGERY     Comment:  lasik 02/21/2018:    LMA 3.5 used - no problems reported  PARTIAL MASTECTOMY WITH NEEDLE LOCALIZATION; Right     Comment:  Procedure: PARTIAL MASTECTOMY WITH NEEDLE LOCALIZATION;               Surgeon: Herbert Pun, MD;  Location: [facility] ORS;               Service: General;  Laterality: Right; No date: ROTATOR CUFF REPAIR     Comment:  left shoulder No date: TOE SURGERY; Right     Comment:  has pin and plate in it BMI    Body Mass Index:  26.97 kg/m     Reproductive/Obstetrics                            Anesthesia Physical Anesthesia Plan  ASA: III  Anesthesia Plan: General LMA   Post-op Pain Management:    Induction:   PONV Risk Score and Plan: 3 and Ondansetron, Treatment may vary due to age or medical condition, Midazolam, Dexamethasone and Metaclopromide  Airway Management Planned:   Additional Equipment:   Intra-op Plan:   Post-operative Plan:   Informed Consent: I have reviewed the patients History and Physical, chart, labs  and discussed the procedure including the risks, benefits and alternatives for the proposed anesthesia with the patient or authorized representative who has indicated his/her understanding and acceptance.   Dental Advisory Given  Plan Discussed with: CRNA  Anesthesia Plan Comments:        Anesthesia Quick Evaluation

## 2018-03-07 NOTE — Op Note (Signed)
Preoperative diagnosis: Right breast DCIS with 1 mm closer margin.  Postoperative diagnosis: Right breast DCIS with 1 mm closer margin..  Procedure: Right partial mastectomy of the deep margin.  Anesthesia: GETA (LMA)  Surgeon: Dr. Windell Moment  Wound Classification: Clean  Indications:  Patient is a 62 y.o. female with a right breast DCIS with previous needle guided partial mastectomy. Final pathology showed that the DCIS was 1 mm from the deep margin. Re excision of the deep margin needed to get at least 2 mm margin.    Findings: 1. Seroma drained. Clear fluid. 2. No palpable masses or nodes identified 3. Adequate hemostasis  Description of procedure: The patient was taken to the operating room and placed supine on the operating table, and after general anesthesia (LMA) was administered, the right chest was prepped and draped in the usual sterile fashion. A time-out was completed verifying correct patient, procedure, site, positioning, and implant(s) and/or special equipment prior to beginning this procedure.  An elliptical incision was made around the previous circumareolar skin incision incision. Dissection was carried down until cavity was entered. The seroma was drained. The deep margin was grabbed with Alice clamps and more than 1 mm depth was resected.    After removing the margin, the cavity was palpated and no additional abnormalities was palpated. The specimen was oriented and submitted to pathology.  Hemostasis was achieved with electrocautery. The subcutaneous tissue immediately under the skin was approximated with interrupted 3-0 Vicryl sutures.  No attempt was made to close the dead space. The skin was closed with a subcuticular suture of running monocryl 4-0. Dermabond was applied.  The patient tolerated the procedure well and was taken to the postanesthesia care unit in stable condition.   Specimen: Right breast deep margin (nylon on cavity side, margin maps to orient the  deep margin used: cranial, caudal, lateral, medial.)                     Right breast scar  Complications: None  Estimated Blood Loss: 2 mL

## 2018-03-07 NOTE — Transfer of Care (Signed)
Immediate Anesthesia Transfer of Care Note  Patient: Michelle Moore  Procedure(s) Performed: BREAST LUMPECTOMY/ RE EXCISION (Right Breast)  Patient Location: PACU  Anesthesia Type:General  Level of Consciousness: awake  Airway & Oxygen Therapy: Patient connected to face mask oxygen  Post-op Assessment: Post -op Vital signs reviewed and stable  Post vital signs: stable  Last Vitals:  Vitals Value Taken Time  BP 148/57 03/07/2018  3:25 PM  Temp    Pulse 82 03/07/2018  3:25 PM  Resp 16 03/07/2018  3:25 PM  SpO2 100 % 03/07/2018  3:25 PM  Vitals shown include unvalidated device data.  Last Pain:  Vitals:   03/07/18 1241  TempSrc: Temporal  PainSc: 4          Complications: No apparent anesthesia complications

## 2018-03-07 NOTE — Anesthesia Postprocedure Evaluation (Signed)
Anesthesia Post Note  Patient: JAMISE PENTLAND  Procedure(s) Performed: BREAST LUMPECTOMY/ RE EXCISION (Right Breast)  Patient location during evaluation: PACU Anesthesia Type: General Level of consciousness: awake and alert Pain management: pain level controlled Vital Signs Assessment: post-procedure vital signs reviewed and stable Respiratory status: spontaneous breathing, nonlabored ventilation, respiratory function stable and patient connected to nasal cannula oxygen Cardiovascular status: blood pressure returned to baseline and stable Postop Assessment: no apparent nausea or vomiting Anesthetic complications: no Comments: Patient reports that she normal runs lower 90's upper 80's saturation on room air     Last Vitals:  Vitals:   03/07/18 1625 03/07/18 1640  BP: (!) 141/65 (!) 138/57  Pulse: 72 74  Resp: 19 15  Temp:    SpO2: 90% (!) 87%    Last Pain:  Vitals:   03/07/18 1625  TempSrc:   PainSc: 4                  Precious Haws Kianna Billet

## 2018-03-07 NOTE — Anesthesia Post-op Follow-up Note (Signed)
Anesthesia QCDR form completed.        

## 2018-03-07 NOTE — Discharge Instructions (Addendum)
°  Diet: Resume home heart healthy regular diet.   Activity: No heavy lifting >20 pounds (children, pets, laundry, garbage) or strenuous activity until follow-up, but light activity and walking are encouraged. Do not drive or drink alcohol if taking narcotic pain medications.  Wound care: May shower with soapy water and pat dry (do not rub incisions), but no baths or submerging incision underwater until follow-up. (no swimming)   Medications: Resume all home medications except aspirin. Restart aspirin on 03/09/18.  For mild to moderate pain: acetaminophen (Tylenol) or ibuprofen (if no kidney disease). Combining Tylenol with alcohol can substantially increase your risk of causing liver disease. Narcotic pain medications, if prescribed, can be used for severe pain, though may cause nausea, constipation, and drowsiness. Do not combine Tylenol and Norco within a 6 hour period as Norco contains Tylenol. If you do not need the narcotic pain medication, you do not need to fill the prescription.  Call office [phone]) at any time if any questions, worsening pain, fevers/chills, bleeding, drainage from incision site, or other concerns.    AMBULATORY SURGERY  DISCHARGE INSTRUCTIONS   1) The drugs that you were given will stay in your system until tomorrow so for the next 24 hours you should not:  A) Drive an automobile B) Make any legal decisions C) Drink any alcoholic beverage   2) You may resume regular meals tomorrow.  Today it is better to start with liquids and gradually work up to solid foods.  You may eat anything you prefer, but it is better to start with liquids, then soup and crackers, and gradually work up to solid foods.   3) Please notify your doctor immediately if you have any unusual bleeding, trouble breathing, redness and pain at the surgery site, drainage, fever, or pain not relieved by medication.    4) Additional Instructions:        Please contact your physician  with any problems or Same Day Surgery at [phone], Monday through Friday 6 am to 4 pm, or [place] at [facility] number at [phone].

## 2018-03-07 NOTE — H&P (Signed)
PATIENT PROFILE: Michelle Moore is a 62 y.o. female who presents to the Clinic for evaluation of right breast cancer.  PCP: Michelle Guise, MD  HISTORY OF PRESENT ILLNESS: Michelle Moore reports she had her regular yearly screening mammogram. Denies any complain of breast pain, palpable mass, skin changes, nipple secretions or retraction. On screening mammography, suspicious calcifications were found which were confirmed by diagnostic mammogram on the upper outer quadrant of the right breast. The calcifications were described as a linear pattern with a span of 2.3cm. The calcifications were biopsied by stereotatic core needle biopsy. Biopsy shows right breast DCIS, high grade, comedy type. ER/PR receptors deferred for the excision specimen. Post biopsy X marker left in place on the posterior aspect of the calcifications.  Patient had lumpectomy and was confirmed to have DCIC. The mass was removed completely but the closes margin was 1 mm. Patient was oriented about this finding and about the recommendation of the tumor board to do a re excision.   Family history of breast cancer: two aunts from mother side Family history of other cancers: none Menarche: 62 years old Menopause: 63 years old after hysterectomy due to fibromas Used OCP: in her 53's for ~2-3 years Used estrogen and progesterone therapy: yes (less than a year, but does not recall the name)  History of Radiation to the chest: none No previous breast biopsy.   PROBLEM LIST: Problem List Date Reviewed: 12/07/2017  Noted  Malignant neoplasm of upper-outer quadrant of right female breast (CMS-HCC) 02/09/2018  Rheumatoid arthritis involving multiple sites with positive rheumatoid factor (CMS-HCC) 11/02/2016  Non-ST elevation myocardial infarction (NSTEMI), subendocardial infarction, subsequent episode of care (CMS-HCC) 08/19/2015  Lumbar radiculitis 10/24/2014  DDD (degenerative disc disease), lumbar 10/24/2014  Gastroesophageal reflux disease  with esophagitis 08/14/2014  Bilateral carotid artery stenosis 08/14/2014  Overview  Less than 50% right and 70%left 2018   Pes anserine bursitis 08/06/2014  PRES (posterior reversible encephalopathy syndrome) 07/14/2014  OSA on CPAP 07/14/2014  Overview  Super compliant to her home CPAP, was about to be scheduled in [place] for a CPAP titration -classically, untreated sleep apneas (especially obstructive) are known to be a cause of HTN, often refractory to treatment and  -recently, untreated sleep apneas (especially obstructive) have been found for patients with major depression to be a likely predictor of treatment-resistant depression.  Weight loss of more than 10% body weight (intentional) 07/14/2014  Overview  With PRES syndrome please do not use Chinese herbal remedies to lose weight   Cervical spondylosis without myelopathy 01/20/2014  Coronary artery disease Unknown  Overview  S/P SUBENDOCARDIAL MYOCARDIAL INFARCTION, PCI AND XIENCE STENT PLACED IN THE RCA 10/2009 Mixed hyperlipidemia Unknown  S/P cardiac catheterization Unknown  Overview  SHOWING 50% OBTUSE MARGINAL ATHEROSCLEROSIS  GENERAL REVIEW OF SYSTEMS:   General ROS: negative for - chills, fatigue, fever, weight gain or weight loss Allergy and Immunology ROS: negative for - hives  Hematological and Lymphatic ROS: positive for - bleeding problems or bruising, negative for palpable nodes Endocrine ROS: negative for - heat or cold intolerance, hair changes Respiratory ROS: negative for - cough, shortness of breath or wheezing Cardiovascular ROS: no chest pain or palpitations GI ROS: negative for nausea, vomiting, abdominal pain, diarrhea, constipation Musculoskeletal ROS: positive for - joint swelling or muscle pain Neurological ROS: negative for - confusion, syncope Dermatological ROS: negative for pruritus and rash Psychiatric: negative for anxiety, depression, difficulty sleeping and memory  loss  MEDICATIONS: Current Outpatient Medications  Medication Sig Dispense Refill  .  ALPRAZolam (XANAX) 0.25 MG tablet Take 0.25 mg by mouth nightly as needed.  0  . aspirin 81 MG EC tablet Take 81 mg by mouth once daily.   Marland Kitchen aspirin-acetaminophen-caffeine (EXCEDRIN MIGRAINE) 250-250-65 mg per tablet Take 2 tablets by mouth every 6 (six) hours as needed.   . citalopram (CELEXA) 40 MG tablet Take 40 mg by mouth nightly.  2  . fluticasone-salmeterol (ADVAIR DISKUS) 250-50 mcg/dose diskus inhaler Inhale 1 inhalation into the lungs every 12 (twelve) hours.  . folic acid (FOLVITE) 1 MG tablet TAKE TWO TABLETS DAILY AS DIRECTED BY PHYSICIAN 60 tablet 3  . loratadine (CLARITIN) 10 mg tablet Take 10 mg by mouth once daily.  . methotrexate (RHEUMATREX) 2.5 MG tablet TAKE 8 TABLETS WEEKLY AS DIRECTED BY PHYSICIAN 32 tablet 4  . montelukast (SINGULAIR) 10 mg tablet Take 10 mg by mouth nightly.  Marland Kitchen omeprazole (PRILOSEC) 20 MG DR capsule Take 20 mg by mouth once daily.  . predniSONE (DELTASONE) 5 MG tablet Take as directed - 12 day taper 48 tablet 0  . simvastatin (ZOCOR) 40 MG tablet Take 1 tablet (40 mg total) by mouth nightly. 30 tablet 5  . traMADol (ULTRAM) 50 mg tablet TAKE 1 TO 2 TABLETS TWICE A DAY AS NEEDED 120 tablet 5   No current facility-administered medications for this visit.   ALLERGIES: Patient has no known allergies.  PAST MEDICAL HISTORY: Past Medical History:  Diagnosis Date  . Allergic state  . Anxiety  . Breast cancer , unspecified (CMS-HCC) 01/2018  . COPD (chronic obstructive pulmonary disease) , unspecified (CMS-HCC)  . Coronary artery disease  . Depression  . Hyperlipidemia  . Hypertension  . Myocardial infarction (CMS-HCC)  . OSA on CPAP 07/14/2014  Super compliant to her home CPAP, was about to be scheduled in [place] for a CPAP titration -classically, untreated sleep apneas (especially obstructive) are known to be a cause of HTN, often refractory to treatment  and -recently, untreated sleep apneas (especially obstructive) have been found for patients with major depression to be a likely predictor of treatment-resistant depression.  Marland Kitchen PRES (posterior reversible encephalopathy syndrome) 07/14/2014  . QT prolongation 07/14/2014  On admission 07/11/2014 QTc was reported as 489 msec, on an ECG without arrhythmia(s) or conduction disorder(s) complicating Qtc interpretation. Plan: decrease citalopram from 40 to 20, serial AM ECGs and monitor & supplement prn potassium, magnesium & calcium.  . S/P cardiac catheterization  . Seasonal allergies  . Tobacco user  . VHD (valvular heart disease)  . Weight loss of more than 10% body weight (intentional) 07/14/2014  With PRES syndrome please do not use Chinese herbal remedies to lose weight   PAST SURGICAL HISTORY: Past Surgical History:  Procedure Laterality Date  . Anterior Cervical Discectomy and Fusion 01/20/2014  . APPENDECTOMY Age 38  . ARTHROSCOPIC ROTATOR CUFF REPAIR  . Cardiac cath  . COLONOSCOPY 02/25/2005  Adenomatous Polyps: CBF 02/2007  . CORONARY ANGIOPLASTY  . EGD 02/25/2005  No repeat per RTE  . HYSTERECTOMY 1996  . PCI and Xience stent placed in the right coronary artery 10/2009  . Toe Surgery (w/ hardware)    FAMILY HISTORY: Family History  Problem Relation Age of Onset  . Coronary Artery Disease (Blocked arteries around heart) Mother  . Diabetes type II Mother  . Kidney disease Mother  . Arthritis Mother  . Gout Mother  . Myocardial Infarction (Heart attack) Father  . Breast cancer Maternal Aunt    SOCIAL HISTORY: Social History   Socioeconomic  History  . Marital status: Married  Spouse name: Not on file  . Number of children: Not on file  . Years of education: Not on file  . Highest education level: Not on file  Occupational History  . Not on file  Social Needs  . Financial resource strain: Not on file  . Food insecurity:  Worry: Not on file  Inability: Not on file  .  Transportation needs:  Medical: Not on file  Non-medical: Not on file  Tobacco Use  . Smoking status: Former Smoker  Packs/day: 1.00  Years: 40.00  Pack years: 40.00  Types: Cigarettes  Last attempt to quit: 04/2016  Years since quitting: 1.8  . Smokeless tobacco: Never Used  Substance and Sexual Activity  . Alcohol use: No  Alcohol/week: 0.6 oz  Types: 1 Standard drinks or equivalent per week  . Drug use: No  . Sexual activity: Never  Partners: Male  Other Topics Concern  . Not on file  Social History Narrative  1 son  Previously worked as Copywriter, advertising for [employer]: Vitals:  02/09/18 0809  BP: 115/86  Pulse: 73  Temp: 36.7 C (98 F)   Body mass index is 28.71 kg/m. Weight: 66.7 kg (147 lb)   GENERAL: Alert, active, oriented x3  HEENT: Pupils equal reactive to light. Extraocular movements are intact. Sclera clear. Palpebral conjunctiva normal red color.Pharynx clear.  NECK: Supple with no palpable mass and no adenopathy.  LUNGS: Sound clear with no rales rhonchi or wheezes.  HEART: Regular rhythm S1 and S2 without murmur.  BREAST: Right breast with dry and clean wound. Small fluid collection palpable, no erythema, no drainage.   ABDOMEN: Soft and depressible, nontender with no palpable mass, no hepatomegaly.  EXTREMITIES: Well-developed well-nourished symmetrical with no dependent edema.  NEUROLOGICAL: Awake alert oriented, facial expression symmetrical, moving all extremities.  REVIEW OF DATA: I have reviewed the following data today: Appointment on 12/13/2017  Component Date Value  . AST 12/13/2017 15  . ALT 12/13/2017 16  . Creatinine 12/13/2017 0.8  . Glomerular Filtration Ra* 12/13/2017 73  . Albumin 12/13/2017 4.3  . Sedimentation Rate-Autom* 12/13/2017 14  . WBC (White Blood Cell Co* 12/13/2017 8.5  . RBC (Red Blood Cell Coun* 12/13/2017 5.12  . Hemoglobin 12/13/2017 16.2*  . Hematocrit 12/13/2017 50.2*  . MCV (Mean  Corpuscular Vo* 12/13/2017 98.0  . MCH (Mean Corpuscular He* 12/13/2017 31.6*  . MCHC (Mean Corpuscular H* 12/13/2017 32.3  . Platelet Count 12/13/2017 286  . RDW-CV (Red Cell Distrib* 12/13/2017 14.1  . MPV (Mean Platelet Volum* 12/13/2017 9.7  . Neutrophils 12/13/2017 5.23  . Lymphocytes 12/13/2017 2.24  . Monocytes 12/13/2017 0.80  . Eosinophils 12/13/2017 0.10  . Basophils 12/13/2017 0.06  . Neutrophil % 12/13/2017 61.7  . Lymphocyte % 12/13/2017 26.5  . Monocyte % 12/13/2017 9.5  . Eosinophil % 12/13/2017 1.2  . Basophil% 12/13/2017 0.7  . Immature Granulocyte % 12/13/2017 0.4  . Immature Granulocyte Cou* 12/13/2017 0.03   I personally reviewed the pathology report of the partial mastectomy and discussed with patient    Surgical Pathology  DIAGNOSIS:  A. BREAST, RIGHT, UPPER OUTER QUADRANT; MAMMOGRAPHICALLY-DIRECTED  PARTIAL MASTECTOMY WITH WIRE LOCALIZATION:  - DUCTAL CARCINOMA IN SITU (DCIS), HIGH NUCLEAR GRADE WITH  COMEDONECROSIS, ASSOCIATED WITH CALCIFICATIONS.  - BIOPSY SITE CHANGES AND MARKER CLIP PRESENT.  - SEE SUMMARY AND COMMENT BELOW FOR MARGINS.   B. BREAST, RIGHT, MEDIAL BORDER; RE-EXCISION:  - NEGATIVE FOR ATYPIA  AND MALIGNANCY.   CANCER CASE SUMMARY: DUCTAL CARCINOMA IN SITU OF THE BREAST  Procedure: Excision  Specimen Laterality: Right  Size (Extent) of DCIS: at least 20 mm  Histologic Type: Ductal carcinoma in situ  Nuclear Grade: Grade III (high)  Necrosis: Present, central  Margins: Uninvolved by DCIS            Distance from closest margin: 1 mm            Specify closest margin: Deep   Regional Lymph nodes: Lymph nodes not submitted or found  Pathologic Stage Classification (pTNM, AJCC 8th Edition): pTis (DCIS)  pNX       ASSESSMENT: Ms. Belford is a 62 y.o. female presenting status post partial mastectomy for DCIC.   Patient was found with DCIS with the closest margin from the DCIS to the margin being 1 mm (Deep  margin). Case discussed in tumor board and patient oriented about the need of re excision.   PLAN: 1. Partial mastectomy of left breast (Re excision of deep margin)  Patient verbalized understanding, all questions were answered, and were agreeable with the plan outlined above.   Herbert Pun, MD  Electronically signed by Herbert Pun, MD

## 2018-03-07 NOTE — Anesthesia Procedure Notes (Signed)
Procedure Name: LMA Insertion Date/Time: 03/07/2018 2:10 PM Performed by: Aline Brochure, CRNA Pre-anesthesia Checklist: Patient identified, Emergency Drugs available, Suction available and Patient being monitored Patient Re-evaluated:Patient Re-evaluated prior to induction Oxygen Delivery Method: Circle system utilized Preoxygenation: Pre-oxygenation with 100% oxygen Induction Type: IV induction Ventilation: Mask ventilation without difficulty LMA: LMA inserted LMA Size: 4.0 Number of attempts: 1 Placement Confirmation: positive ETCO2 and breath sounds checked- equal and bilateral Tube secured with: Tape Dental Injury: Teeth and Oropharynx as per pre-operative assessment

## 2018-03-07 NOTE — Interval H&P Note (Signed)
History and Physical Interval Note:  03/07/2018 1:36 PM  Michelle Moore  has presented today for surgery, with the diagnosis of MALIGNANT NEOPLASM OF RIGHT BREAST  The various methods of treatment have been discussed with the patient and family. After consideration of risks, benefits and other options for treatment, the patient has consented to  Procedure(s): BREAST LUMPECTOMY/ RE EXCISION (Right) as a surgical intervention .  The patient's history has been reviewed, patient examined, no change in status, stable for surgery.  I have reviewed the patient's chart and labs.  Right breast marked in the pre procedure room. Questions were answered to the patient's satisfaction.     Herbert Pun

## 2018-03-08 ENCOUNTER — Encounter: Payer: Self-pay | Admitting: General Surgery

## 2018-03-09 LAB — SURGICAL PATHOLOGY

## 2018-03-14 ENCOUNTER — Other Ambulatory Visit: Payer: Self-pay | Admitting: Nurse Practitioner

## 2018-03-14 DIAGNOSIS — M150 Primary generalized (osteo)arthritis: Secondary | ICD-10-CM | POA: Diagnosis not present

## 2018-03-14 DIAGNOSIS — F411 Generalized anxiety disorder: Secondary | ICD-10-CM

## 2018-03-14 DIAGNOSIS — Z79899 Other long term (current) drug therapy: Secondary | ICD-10-CM | POA: Diagnosis not present

## 2018-03-14 DIAGNOSIS — M0579 Rheumatoid arthritis with rheumatoid factor of multiple sites without organ or systems involvement: Secondary | ICD-10-CM | POA: Diagnosis not present

## 2018-03-14 MED ORDER — ALPRAZOLAM 0.25 MG PO TABS: 0.2500 mg | ORAL_TABLET | Freq: Two times a day (BID) | ORAL | 1 refills | Status: DC | PRN

## 2018-03-14 NOTE — Progress Notes (Signed)
Renewed rx for alprazolam 0.25mg  twice daily as needed and sent to pharmacy.

## 2018-03-18 DIAGNOSIS — D0511 Intraductal carcinoma in situ of right breast: Secondary | ICD-10-CM | POA: Insufficient documentation

## 2018-03-18 NOTE — Progress Notes (Signed)
[place]  [phone] [phone]  ID: [name] OB: [birth year]  MR#: [id number]  QXI#:[id number]  Patient Care Team: Lavera Guise, MD as PCP - General (Internal Medicine)  CHIEF COMPLAINT: DCIS, right breast.  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  She was initially evaluated by a different provider.  She continues to have breast tenderness, but otherwise feels well.  She has no neurologic complaints.  She denies any recent fevers or illnesses.  She has a good appetite and denies weight loss.  She has no chest pain or shortness of breath.  She denies any nausea, vomiting, constipation, or diarrhea.  She has no urinary complaints.  Patient feels at her baseline offers no further specific complaints today.  REVIEW OF SYSTEMS:   Review of Systems  Constitutional: Negative.  Negative for fever, malaise/fatigue and weight loss.  Respiratory: Negative.  Negative for cough, hemoptysis and shortness of breath.   Cardiovascular: Negative.  Negative for chest pain and leg swelling.  Gastrointestinal: Negative.  Negative for abdominal pain.  Genitourinary: Negative.  Negative for dysuria.  Musculoskeletal: Negative.   Skin: Negative.  Negative for rash.  Neurological: Negative.  Negative for sensory change, focal weakness, weakness and headaches.  Psychiatric/Behavioral: Negative.  The patient is not nervous/anxious.     As per HPI. Otherwise, a complete review of systems is negative.  PAST MEDICAL HISTORY: Past Medical History:  Diagnosis Date  . Anxiety   . Arthritis   . Cancer ([place])   . COPD (chronic obstructive pulmonary disease) ([place])   . Dyspnea    ONLY WITH EXERTION  . GERD (gastroesophageal reflux disease)    "sometimes"  . Headache(784.0)    sinus headaches  . Myocardial infarction ([place])    in 2011  no damage  . Seizures ([place]) 2014   X1  . Sleep apnea      USES CPAP    PAST SURGICAL HISTORY: Past Surgical History:  Procedure Laterality Date  . ABDOMINAL HYSTERECTOMY    . ANTERIOR CERVICAL DECOMP/DISCECTOMY FUSION N/A 01/20/2014   Procedure: ANTERIOR CERVICAL DECOMPRESSION/DISCECTOMY FUSION 2 LEVELS cervical five/six six Tarry Kos;  Surgeon: Ophelia Charter, MD;  Location: [facility];  Service: Neurosurgery;  Laterality: N/A;  . BREAST BIOPSY Right 02/01/2018   x shape, path pending  . BREAST LUMPECTOMY Right 03/07/2018   Procedure: BREAST LUMPECTOMY/ RE EXCISION;  Surgeon: Herbert Pun, MD;  Location: [facility] ORS;  Service: General;  Laterality: Right;  . CARDIAC CATHETERIZATION     2011  . CORONARY ANGIOPLASTY WITH STENT PLACEMENT     PLACED IN RCA 2011  . EYE SURGERY     lasik  . PARTIAL MASTECTOMY WITH NEEDLE LOCALIZATION Right 02/21/2018   Procedure: PARTIAL MASTECTOMY WITH NEEDLE LOCALIZATION;  Surgeon: Herbert Pun, MD;  Location: [facility] ORS;  Service: General;  Laterality: Right;  . ROTATOR CUFF REPAIR     left shoulder  . TOE SURGERY Right    has pin and plate in it    FAMILY HISTORY: Family History  Problem Relation Age of Onset  . Breast cancer Cousin 31       bilateral at 4 and 51; daughter of maternal aunt who was unaffected  . Lung cancer Maternal Aunt        dx 65s; deceased 35s; smoker  . Heart attack Father        deceased 6  . Stomach cancer Maternal Grandmother  ADVANCED DIRECTIVES (Y/N):  N  HEALTH MAINTENANCE: Social History   Tobacco Use  . Smoking status: Former Smoker    Packs/day: 1.00    Years: 30.00    Pack years: 30.00    Types: Cigarettes    Last attempt to quit: 09/13/2015    Years since quitting: 2.5  . Smokeless tobacco: Never Used  Substance Use Topics  . Alcohol use: Yes    Comment: occasional  . Drug use: No     Colonoscopy:  PAP:  Bone density:  Lipid panel:  Allergies  Allergen Reactions  . Other     BANDAIDS-OF LEFT ON FOR AN EXTENDED PERIOD OF TIME     Current Outpatient Medications  Medication Sig Dispense Refill  . ALPRAZolam (XANAX) 0.25 MG tablet Take 1 tablet (0.25 mg total) by mouth 2 (two) times daily as needed for anxiety. 180 tablet 1  . amoxicillin-clavulanate (AUGMENTIN) 875-125 MG tablet Take 1 tablet by mouth 2 (two) times daily. (Patient not taking: Reported on 03/06/2018) 14 tablet 0  . aspirin EC 81 MG tablet Take 81 mg by mouth at bedtime.    . Aspirin-Acetaminophen-Caffeine (EXCEDRIN EXTRA STRENGTH PO) Take 2 tablets by mouth daily as needed (for arm and neck pain).    . cetirizine (ZYRTEC) 10 MG tablet Take 10 mg by mouth daily as needed for allergies.     . Chlorphen-Pseudoephed-APAP (CORICIDIN D PO) Take by mouth as needed.    . citalopram (CELEXA) 40 MG tablet Take 40 mg by mouth at bedtime.    Marland Kitchen dextromethorphan (DELSYM) 30 MG/5ML liquid Take 30 mg by mouth as needed for cough.    . fluticasone (FLONASE) 50 MCG/ACT nasal spray Place 2 sprays into both nostrils daily as needed for allergies.     . Fluticasone-Salmeterol (ADVAIR) 250-50 MCG/DOSE AEPB Inhale 1 puff into the lungs 2 (two) times daily. 60 each 5  . folic acid (FOLVITE) 357 MCG tablet Take 1,600 mcg by mouth daily.    . methotrexate (RHEUMATREX) 2.5 MG tablet TAKE 8 TABLETS WEEKLY ON WEDNESDAYS    . montelukast (SINGULAIR) 10 MG tablet Take 1 tablet (10 mg total) by mouth at bedtime. 30 tablet 3  . Omeprazole 20 MG TBEC Take 20 mg by mouth every morning.     Marland Kitchen OVER THE COUNTER MEDICATION Take 1 drop by mouth daily. CBD Oil    . Pseudoephedrine-Ibuprofen (ADVIL COLD/SINUS) 30-200 MG TABS Take 1 tablet by mouth as needed.    . simvastatin (ZOCOR) 40 MG tablet Take 40 mg by mouth at bedtime.    . traMADol (ULTRAM) 50 MG tablet 2 TAB QD     No current facility-administered medications for this visit.     OBJECTIVE: Vitals:   03/21/18 1049  BP: 128/82  Pulse: 74  Resp: 18  Temp: (!) 97 F (36.1 C)     Body mass index is 27.18 kg/m.    ECOG FS:0 -  Asymptomatic  General: Well-developed, well-nourished, no acute distress. Eyes: Pink conjunctiva, anicteric sclera. Breast: Patient requested exam be deferred today. HEENT: Normocephalic, moist mucous membranes, clear oropharnyx. Lungs: Clear to auscultation bilaterally. Heart: Regular rate and rhythm. No rubs, murmurs, or gallops. Abdomen: Soft, nontender, nondistended. No organomegaly noted, normoactive bowel sounds. Musculoskeletal: No edema, cyanosis, or clubbing. Neuro: Alert, answering all questions appropriately. Cranial nerves grossly intact. Skin: No rashes or petechiae noted. Psych: Normal affect. Lymphatics: No cervical, calvicular, axillary or inguinal LAD.   LAB RESULTS:  Lab Results  Component Value  Date   NA 141 02/14/2018   K 3.6 02/14/2018   CL 104 02/14/2018   CO2 25 02/14/2018   GLUCOSE 92 02/14/2018   BUN 8 02/14/2018   CREATININE 0.59 02/14/2018   CALCIUM 9.4 02/14/2018   PROT 7.9 02/14/2018   ALBUMIN 4.4 02/14/2018   AST 22 02/14/2018   ALT 23 02/14/2018   ALKPHOS 84 02/14/2018   BILITOT 0.4 02/14/2018   GFRNONAA >60 02/14/2018   GFRAA >60 02/14/2018    Lab Results  Component Value Date   WBC 16.7 (H) 02/14/2018   NEUTROABS 13.6 (H) 02/14/2018   HGB 16.0 02/14/2018   HCT 47.8 (H) 02/14/2018   MCV 96.0 02/14/2018   PLT 267 02/14/2018     STUDIES: No results found.  ASSESSMENT: DCIS, right breast.  PLAN:    1. DCIS, right breast: Pathology and imaging reviewed independently.  No invasive component was noted.  Because of this, she does not require adjuvant chemotherapy.  Patient has consultation with radiation oncology on March 26, 2018.  Because her DCIS is ER positive (PR negative), she will benefit from tamoxifen for a total of 5 years at the conclusion of her XRT.  Return to clinic in 3 months for further evaluation and initiation of tamoxifen  I spent a total of 30 minutes face-to-face with the patient of which greater than 50% of the  visit was spent in counseling and coordination of care as detailed above.  Patient expressed understanding and was in agreement with this plan. She also understands that She can call clinic at any time with any questions, concerns, or complaints.   Cancer Staging Ductal carcinoma in situ (DCIS) of right breast Staging form: Breast, AJCC 8th Edition - Clinical: Stage 0 (cTis (DCIS), cN0, cM0, ER+, PR-, HER2-) - Signed by Lloyd Huger, MD on 03/18/2018   Lloyd Huger, MD   03/23/2018 5:06 PM

## 2018-03-19 ENCOUNTER — Ambulatory Visit: Payer: Medicare HMO | Admitting: Oncology

## 2018-03-21 ENCOUNTER — Inpatient Hospital Stay: Payer: Medicare HMO | Attending: Oncology | Admitting: Oncology

## 2018-03-21 ENCOUNTER — Encounter: Payer: Self-pay | Admitting: Oncology

## 2018-03-21 ENCOUNTER — Encounter (INDEPENDENT_AMBULATORY_CARE_PROVIDER_SITE_OTHER): Payer: Self-pay

## 2018-03-21 DIAGNOSIS — Z170 Estrogen receptor positive status [ER+]: Secondary | ICD-10-CM | POA: Insufficient documentation

## 2018-03-21 DIAGNOSIS — D0511 Intraductal carcinoma in situ of right breast: Secondary | ICD-10-CM | POA: Diagnosis not present

## 2018-03-21 NOTE — Progress Notes (Signed)
Pt in for follow up and new [place] patient, transferring care from Dr Tasia Catchings today.

## 2018-03-26 ENCOUNTER — Encounter: Payer: Self-pay | Admitting: Radiation Oncology

## 2018-03-26 ENCOUNTER — Other Ambulatory Visit: Payer: Self-pay

## 2018-03-26 ENCOUNTER — Ambulatory Visit
Admission: RE | Admit: 2018-03-26 | Discharge: 2018-03-26 | Disposition: A | Discharge status: Home / Self Care | Payer: Medicare HMO | Source: Ambulatory Visit | Attending: Radiation Oncology | Admitting: Radiation Oncology

## 2018-03-26 VITALS — BP 134/79 | HR 80 | Temp 99.5°F | Resp 18 | Wt 139.8 lb

## 2018-03-26 DIAGNOSIS — Z170 Estrogen receptor positive status [ER+]: Secondary | ICD-10-CM | POA: Diagnosis not present

## 2018-03-26 DIAGNOSIS — Z9011 Acquired absence of right breast and nipple: Secondary | ICD-10-CM | POA: Insufficient documentation

## 2018-03-26 DIAGNOSIS — Z87891 Personal history of nicotine dependence: Secondary | ICD-10-CM | POA: Insufficient documentation

## 2018-03-26 DIAGNOSIS — Z7982 Long term (current) use of aspirin: Secondary | ICD-10-CM | POA: Insufficient documentation

## 2018-03-26 DIAGNOSIS — Z79899 Other long term (current) drug therapy: Secondary | ICD-10-CM | POA: Insufficient documentation

## 2018-03-26 DIAGNOSIS — Z803 Family history of malignant neoplasm of breast: Secondary | ICD-10-CM | POA: Insufficient documentation

## 2018-03-26 DIAGNOSIS — I252 Old myocardial infarction: Secondary | ICD-10-CM | POA: Diagnosis not present

## 2018-03-26 DIAGNOSIS — F419 Anxiety disorder, unspecified: Secondary | ICD-10-CM | POA: Diagnosis not present

## 2018-03-26 DIAGNOSIS — D0511 Intraductal carcinoma in situ of right breast: Secondary | ICD-10-CM | POA: Diagnosis not present

## 2018-03-26 DIAGNOSIS — M129 Arthropathy, unspecified: Secondary | ICD-10-CM | POA: Insufficient documentation

## 2018-03-26 DIAGNOSIS — G40909 Epilepsy, unspecified, not intractable, without status epilepticus: Secondary | ICD-10-CM | POA: Diagnosis not present

## 2018-03-26 DIAGNOSIS — K219 Gastro-esophageal reflux disease without esophagitis: Secondary | ICD-10-CM | POA: Diagnosis not present

## 2018-03-26 DIAGNOSIS — Z801 Family history of malignant neoplasm of trachea, bronchus and lung: Secondary | ICD-10-CM | POA: Insufficient documentation

## 2018-03-26 DIAGNOSIS — R0602 Shortness of breath: Secondary | ICD-10-CM | POA: Insufficient documentation

## 2018-03-26 DIAGNOSIS — G4730 Sleep apnea, unspecified: Secondary | ICD-10-CM | POA: Diagnosis not present

## 2018-03-26 DIAGNOSIS — Z800 Family history of malignant neoplasm of digestive organs: Secondary | ICD-10-CM | POA: Insufficient documentation

## 2018-03-26 NOTE — Consult Note (Signed)
NEW PATIENT EVALUATION  Name: Michelle Moore  MRN: [id number]  Date:   03/26/2018     DOB: 08/10/1956   This 62 y.o. female patient presents to the clinic for initial evaluation of stage  (Tis N0 M0) ER/PR positive ductal carcinoma in situ status post wide local excision of the right breast.  REFERRING PHYSICIAN: Lavera Guise, MD  CHIEF COMPLAINT:  Chief Complaint  Patient presents with  . Breast Cancer    Pt is here for initial consultation of breast cancer.     DIAGNOSIS: The encounter diagnosis was Ductal carcinoma in situ (DCIS) of right breast.   PREVIOUS INVESTIGATIONS:  Ultrasound and mammograms reviewed Pathology reports reviewed Clinical notes reviewed  HPI: patient is a 62 year old femalewho presented with an abnormal mammogram back in May of her right breast showing new areas of pleomorphic calcifications within the upper outer quadrant of the right breast spanning approximately 2.3 cm. She N1 stereotactic biopsy positive for ductal carcinoma in situ high-grade with comedonecrosis. Tumor was slightly ER positive PR negative. umor had close margins at less than 1 mm underwent reexcision with clear margins.tumor spanned at least 2 cm was again grade 3 with comedonecrosis present. No lymph nodes were submitted.She has done well postoperatively. She's been seen by medical oncology and recommendation for antiestrogen therapy after radiation has been made.She is seen today and is doing well she specifically denies breast tenderness cough or bone pain.  PLANNED TREATMENT REGIMEN: right hypofractionated whole breast radiation  PAST MEDICAL HISTORY:  has a past medical history of Anxiety, Arthritis, Cancer ([place]), COPD (chronic obstructive pulmonary disease) ([place]), Dyspnea, GERD (gastroesophageal reflux disease), Headache(784.0), Myocardial infarction ([place]), Seizures ([place]) (2014), and Sleep apnea.    PAST SURGICAL HISTORY:  Past Surgical History:  Procedure Laterality Date  .  ABDOMINAL HYSTERECTOMY    . ANTERIOR CERVICAL DECOMP/DISCECTOMY FUSION N/A 01/20/2014   Procedure: ANTERIOR CERVICAL DECOMPRESSION/DISCECTOMY FUSION 2 LEVELS cervical five/six six Tarry Kos;  Surgeon: Ophelia Charter, MD;  Location: [facility];  Service: Neurosurgery;  Laterality: N/A;  . BREAST BIOPSY Right 02/01/2018   x shape, path pending  . BREAST LUMPECTOMY Right 03/07/2018   Procedure: BREAST LUMPECTOMY/ RE EXCISION;  Surgeon: Herbert Pun, MD;  Location: [facility] ORS;  Service: General;  Laterality: Right;  . CARDIAC CATHETERIZATION     2011  . CORONARY ANGIOPLASTY WITH STENT PLACEMENT     PLACED IN RCA 2011  . EYE SURGERY     lasik  . PARTIAL MASTECTOMY WITH NEEDLE LOCALIZATION Right 02/21/2018   Procedure: PARTIAL MASTECTOMY WITH NEEDLE LOCALIZATION;  Surgeon: Herbert Pun, MD;  Location: [facility] ORS;  Service: General;  Laterality: Right;  . ROTATOR CUFF REPAIR     left shoulder  . TOE SURGERY Right    has pin and plate in it    FAMILY HISTORY: family history includes Breast cancer (age of onset: 55) in her cousin; Heart attack in her father; Lung cancer in her maternal aunt; Stomach cancer in her maternal grandmother.  SOCIAL HISTORY:  reports that she quit smoking about 2 years ago. Her smoking use included cigarettes. She has a 30.00 pack-year smoking history. She has never used smokeless tobacco. She reports that she drinks alcohol. She reports that she does not use drugs.  ALLERGIES: Other  MEDICATIONS:  Current Outpatient Medications  Medication Sig Dispense Refill  . ALPRAZolam (XANAX) 0.25 MG tablet Take 1 tablet (0.25 mg total) by mouth 2 (two) times daily as needed for anxiety. 180 tablet 1  .  aspirin EC 81 MG tablet Take 81 mg by mouth at bedtime.    . Aspirin-Acetaminophen-Caffeine (EXCEDRIN EXTRA STRENGTH PO) Take 2 tablets by mouth daily as needed (for arm and neck pain).    . cetirizine (ZYRTEC) 10 MG tablet Take 10 mg by mouth daily as needed for  allergies.     . Chlorphen-Pseudoephed-APAP (CORICIDIN D PO) Take by mouth as needed.    . citalopram (CELEXA) 40 MG tablet Take 40 mg by mouth at bedtime.    Marland Kitchen dextromethorphan (DELSYM) 30 MG/5ML liquid Take 30 mg by mouth as needed for cough.    . fluticasone (FLONASE) 50 MCG/ACT nasal spray Place 2 sprays into both nostrils daily as needed for allergies.     . Fluticasone-Salmeterol (ADVAIR) 250-50 MCG/DOSE AEPB Inhale 1 puff into the lungs 2 (two) times daily. 60 each 5  . folic acid (FOLVITE) 329 MCG tablet Take 1,600 mcg by mouth daily.    . methotrexate (RHEUMATREX) 2.5 MG tablet TAKE 8 TABLETS WEEKLY ON WEDNESDAYS    . montelukast (SINGULAIR) 10 MG tablet Take 1 tablet (10 mg total) by mouth at bedtime. 30 tablet 3  . Omeprazole 20 MG TBEC Take 20 mg by mouth every morning.     Marland Kitchen OVER THE COUNTER MEDICATION Take 1 drop by mouth daily. CBD Oil    . Pseudoephedrine-Ibuprofen (ADVIL COLD/SINUS) 30-200 MG TABS Take 1 tablet by mouth as needed.    . simvastatin (ZOCOR) 40 MG tablet Take 40 mg by mouth at bedtime.    . traMADol (ULTRAM) 50 MG tablet 2 TAB QD    . amoxicillin-clavulanate (AUGMENTIN) 875-125 MG tablet Take 1 tablet by mouth 2 (two) times daily. (Patient not taking: Reported on 03/06/2018) 14 tablet 0   No current facility-administered medications for this encounter.     ECOG PERFORMANCE STATUS:  0 - Asymptomatic  REVIEW OF SYSTEMS:  Patient denies any weight loss, fatigue, weakness, fever, chills or night sweats. Patient denies any loss of vision, blurred vision. Patient denies any ringing  of the ears or hearing loss. No irregular heartbeat. Patient denies heart murmur or history of fainting. Patient denies any chest pain or pain radiating to her upper extremities. Patient denies any shortness of breath, difficulty breathing at night, cough or hemoptysis. Patient denies any swelling in the lower legs. Patient denies any nausea vomiting, vomiting of blood, or coffee ground  material in the vomitus. Patient denies any stomach pain. Patient states has had normal bowel movements no significant constipation or diarrhea. Patient denies any dysuria, hematuria or significant nocturia. Patient denies any problems walking, swelling in the joints or loss of balance. Patient denies any skin changes, loss of hair or loss of weight. Patient denies any excessive worrying or anxiety or significant depression. Patient denies any problems with insomnia. Patient denies excessive thirst, polyuria, polydipsia. Patient denies any swollen glands, patient denies easy bruising or easy bleeding. Patient denies any recent infections, allergies or URI. Patient "s visual fields have not changed significantly in recent time.    PHYSICAL EXAM: BP 134/79   Pulse 80   Temp 99.5 F (37.5 C)   Resp 18   Wt 139 lb 12.4 oz (63.4 kg)   BMI 27.30 kg/m  Right breast is wide local excision the upper outer quadrant which is healing well.No dominant mass or nodularity is noted in either breast in 2 positions examined. No axillary or supraclavicular adenopathy is appreciated.Well-developed well-nourished patient in NAD. HEENT reveals PERLA, EOMI, discs not visualized.  Oral cavity is clear. No oral mucosal lesions are identified. Neck is clear without evidence of cervical or supraclavicular adenopathy. Lungs are clear to A&P. Cardiac examination is essentially unremarkable with regular rate and rhythm without murmur rub or thrill. Abdomen is benign with no organomegaly or masses noted. Motor sensory and DTR levels are equal and symmetric in the upper and lower extremities. Cranial nerves II through XII are grossly intact. Proprioception is intact. No peripheral adenopathy or edema is identified. No motor or sensory levels are noted. Crude visual fields are within normal range.  LABORATORY DATA: pathology reports reviewed    RADIOLOGY RESULTS:mammograms ultrasound reviewed   IMPRESSION: high-grade ductal  carcinoma in situ of the right breast status post wide local excision in 62 year old female.  PLAN: present time I have recommended whole breast radiation to her right breast. I would plan on a hypofractionated course of treatment over 4 weeks. Would then boost her scar another 1600 cGy based on the close margins. Risks and benefits of treatment including skin reaction fatigue alteration of blood counts possible inclusion of superficial lung all were discussed in detail. I have personally set up and ordered CT simulation. Patient also will be candidate for antiestrogen therapy after completion of radiation. Patient seems to comprehend my treatment plan well.  I would like to take this opportunity to thank you for allowing me to participate in the care of your patient.Noreene Filbert, MD

## 2018-04-03 ENCOUNTER — Ambulatory Visit: Payer: Medicare HMO

## 2018-04-04 ENCOUNTER — Ambulatory Visit
Admission: RE | Admit: 2018-04-04 | Discharge: 2018-04-04 | Disposition: A | Discharge status: Home / Self Care | Payer: Medicare HMO | Source: Ambulatory Visit | Attending: Radiation Oncology | Admitting: Radiation Oncology

## 2018-04-04 DIAGNOSIS — D0511 Intraductal carcinoma in situ of right breast: Secondary | ICD-10-CM | POA: Insufficient documentation

## 2018-04-04 DIAGNOSIS — Z170 Estrogen receptor positive status [ER+]: Secondary | ICD-10-CM | POA: Diagnosis not present

## 2018-04-04 DIAGNOSIS — Z510 Encounter for antineoplastic radiation therapy: Secondary | ICD-10-CM | POA: Insufficient documentation

## 2018-04-05 ENCOUNTER — Other Ambulatory Visit: Payer: Self-pay | Admitting: *Deleted

## 2018-04-05 DIAGNOSIS — Z510 Encounter for antineoplastic radiation therapy: Secondary | ICD-10-CM | POA: Diagnosis not present

## 2018-04-05 DIAGNOSIS — Z170 Estrogen receptor positive status [ER+]: Secondary | ICD-10-CM | POA: Diagnosis not present

## 2018-04-05 DIAGNOSIS — D0511 Intraductal carcinoma in situ of right breast: Secondary | ICD-10-CM | POA: Diagnosis not present

## 2018-04-11 ENCOUNTER — Ambulatory Visit
Admission: RE | Admit: 2018-04-11 | Discharge: 2018-04-11 | Disposition: A | Discharge status: Home / Self Care | Payer: Medicare HMO | Source: Ambulatory Visit | Attending: Radiation Oncology | Admitting: Radiation Oncology

## 2018-04-11 DIAGNOSIS — Z170 Estrogen receptor positive status [ER+]: Secondary | ICD-10-CM | POA: Diagnosis not present

## 2018-04-11 DIAGNOSIS — D0511 Intraductal carcinoma in situ of right breast: Secondary | ICD-10-CM | POA: Diagnosis not present

## 2018-04-11 DIAGNOSIS — Z510 Encounter for antineoplastic radiation therapy: Secondary | ICD-10-CM | POA: Diagnosis not present

## 2018-04-12 ENCOUNTER — Ambulatory Visit
Admission: RE | Admit: 2018-04-12 | Discharge: 2018-04-12 | Disposition: A | Discharge status: Home / Self Care | Payer: Medicare HMO | Source: Ambulatory Visit | Attending: Radiation Oncology | Admitting: Radiation Oncology

## 2018-04-12 DIAGNOSIS — D0511 Intraductal carcinoma in situ of right breast: Secondary | ICD-10-CM | POA: Insufficient documentation

## 2018-04-12 DIAGNOSIS — Z170 Estrogen receptor positive status [ER+]: Secondary | ICD-10-CM | POA: Diagnosis not present

## 2018-04-12 DIAGNOSIS — Z510 Encounter for antineoplastic radiation therapy: Secondary | ICD-10-CM | POA: Diagnosis not present

## 2018-04-13 ENCOUNTER — Ambulatory Visit
Admission: RE | Admit: 2018-04-13 | Discharge: 2018-04-13 | Disposition: A | Discharge status: Home / Self Care | Payer: Medicare HMO | Source: Ambulatory Visit | Attending: Radiation Oncology | Admitting: Radiation Oncology

## 2018-04-13 DIAGNOSIS — Z510 Encounter for antineoplastic radiation therapy: Secondary | ICD-10-CM | POA: Diagnosis not present

## 2018-04-13 DIAGNOSIS — Z170 Estrogen receptor positive status [ER+]: Secondary | ICD-10-CM | POA: Diagnosis not present

## 2018-04-13 DIAGNOSIS — D0511 Intraductal carcinoma in situ of right breast: Secondary | ICD-10-CM | POA: Diagnosis not present

## 2018-04-16 ENCOUNTER — Ambulatory Visit
Admission: RE | Admit: 2018-04-16 | Discharge: 2018-04-16 | Disposition: A | Discharge status: Home / Self Care | Payer: Medicare HMO | Source: Ambulatory Visit | Attending: Radiation Oncology | Admitting: Radiation Oncology

## 2018-04-16 DIAGNOSIS — Z170 Estrogen receptor positive status [ER+]: Secondary | ICD-10-CM | POA: Diagnosis not present

## 2018-04-16 DIAGNOSIS — D0511 Intraductal carcinoma in situ of right breast: Secondary | ICD-10-CM | POA: Diagnosis not present

## 2018-04-16 DIAGNOSIS — Z510 Encounter for antineoplastic radiation therapy: Secondary | ICD-10-CM | POA: Diagnosis not present

## 2018-04-17 ENCOUNTER — Ambulatory Visit
Admission: RE | Admit: 2018-04-17 | Discharge: 2018-04-17 | Disposition: A | Discharge status: Home / Self Care | Payer: Medicare HMO | Source: Ambulatory Visit | Attending: Radiation Oncology | Admitting: Radiation Oncology

## 2018-04-17 DIAGNOSIS — D0511 Intraductal carcinoma in situ of right breast: Secondary | ICD-10-CM | POA: Diagnosis not present

## 2018-04-17 DIAGNOSIS — Z510 Encounter for antineoplastic radiation therapy: Secondary | ICD-10-CM | POA: Diagnosis not present

## 2018-04-17 DIAGNOSIS — Z170 Estrogen receptor positive status [ER+]: Secondary | ICD-10-CM | POA: Diagnosis not present

## 2018-04-18 ENCOUNTER — Ambulatory Visit
Admission: RE | Admit: 2018-04-18 | Discharge: 2018-04-18 | Disposition: A | Discharge status: Home / Self Care | Payer: Medicare HMO | Source: Ambulatory Visit | Attending: Radiation Oncology | Admitting: Radiation Oncology

## 2018-04-18 DIAGNOSIS — Z170 Estrogen receptor positive status [ER+]: Secondary | ICD-10-CM | POA: Diagnosis not present

## 2018-04-18 DIAGNOSIS — M7541 Impingement syndrome of right shoulder: Secondary | ICD-10-CM | POA: Diagnosis not present

## 2018-04-18 DIAGNOSIS — Z510 Encounter for antineoplastic radiation therapy: Secondary | ICD-10-CM | POA: Diagnosis not present

## 2018-04-18 DIAGNOSIS — M7581 Other shoulder lesions, right shoulder: Secondary | ICD-10-CM | POA: Diagnosis not present

## 2018-04-18 DIAGNOSIS — D0511 Intraductal carcinoma in situ of right breast: Secondary | ICD-10-CM | POA: Diagnosis not present

## 2018-04-19 ENCOUNTER — Ambulatory Visit
Admission: RE | Admit: 2018-04-19 | Discharge: 2018-04-19 | Disposition: A | Discharge status: Home / Self Care | Payer: Medicare HMO | Source: Ambulatory Visit | Attending: Radiation Oncology | Admitting: Radiation Oncology

## 2018-04-19 DIAGNOSIS — Z510 Encounter for antineoplastic radiation therapy: Secondary | ICD-10-CM | POA: Diagnosis not present

## 2018-04-19 DIAGNOSIS — Z170 Estrogen receptor positive status [ER+]: Secondary | ICD-10-CM | POA: Diagnosis not present

## 2018-04-19 DIAGNOSIS — D0511 Intraductal carcinoma in situ of right breast: Secondary | ICD-10-CM | POA: Diagnosis not present

## 2018-04-20 ENCOUNTER — Ambulatory Visit
Admission: RE | Admit: 2018-04-20 | Discharge: 2018-04-20 | Disposition: A | Discharge status: Home / Self Care | Payer: Medicare HMO | Source: Ambulatory Visit | Attending: Radiation Oncology | Admitting: Radiation Oncology

## 2018-04-20 DIAGNOSIS — Z170 Estrogen receptor positive status [ER+]: Secondary | ICD-10-CM | POA: Diagnosis not present

## 2018-04-20 DIAGNOSIS — D0511 Intraductal carcinoma in situ of right breast: Secondary | ICD-10-CM | POA: Diagnosis not present

## 2018-04-20 DIAGNOSIS — Z510 Encounter for antineoplastic radiation therapy: Secondary | ICD-10-CM | POA: Diagnosis not present

## 2018-04-23 ENCOUNTER — Ambulatory Visit
Admission: RE | Admit: 2018-04-23 | Discharge: 2018-04-23 | Disposition: A | Discharge status: Home / Self Care | Payer: Medicare HMO | Source: Ambulatory Visit | Attending: Radiation Oncology | Admitting: Radiation Oncology

## 2018-04-23 DIAGNOSIS — Z170 Estrogen receptor positive status [ER+]: Secondary | ICD-10-CM | POA: Diagnosis not present

## 2018-04-23 DIAGNOSIS — D0511 Intraductal carcinoma in situ of right breast: Secondary | ICD-10-CM | POA: Diagnosis not present

## 2018-04-23 DIAGNOSIS — Z510 Encounter for antineoplastic radiation therapy: Secondary | ICD-10-CM | POA: Diagnosis not present

## 2018-04-24 ENCOUNTER — Ambulatory Visit
Admission: RE | Admit: 2018-04-24 | Discharge: 2018-04-24 | Disposition: A | Discharge status: Home / Self Care | Payer: Medicare HMO | Source: Ambulatory Visit | Attending: Radiation Oncology | Admitting: Radiation Oncology

## 2018-04-24 ENCOUNTER — Inpatient Hospital Stay: Payer: Medicare HMO | Attending: Oncology

## 2018-04-24 ENCOUNTER — Other Ambulatory Visit: Payer: Self-pay

## 2018-04-24 DIAGNOSIS — Z510 Encounter for antineoplastic radiation therapy: Secondary | ICD-10-CM | POA: Diagnosis not present

## 2018-04-24 DIAGNOSIS — D0511 Intraductal carcinoma in situ of right breast: Secondary | ICD-10-CM

## 2018-04-24 DIAGNOSIS — Z170 Estrogen receptor positive status [ER+]: Secondary | ICD-10-CM | POA: Diagnosis not present

## 2018-04-24 LAB — CBC
HEMATOCRIT: 45.8 % (ref 35.0–47.0)
Hemoglobin: 15.3 g/dL (ref 12.0–16.0)
MCH: 31.3 pg (ref 26.0–34.0)
MCHC: 33.4 g/dL (ref 32.0–36.0)
MCV: 93.9 fL (ref 80.0–100.0)
PLATELETS: 242 10*3/uL (ref 150–440)
RBC: 4.88 MIL/uL (ref 3.80–5.20)
RDW: 14.6 % — ABNORMAL HIGH (ref 11.5–14.5)
WBC: 10.3 10*3/uL (ref 3.6–11.0)

## 2018-04-25 ENCOUNTER — Other Ambulatory Visit: Payer: Self-pay

## 2018-04-25 ENCOUNTER — Ambulatory Visit
Admission: RE | Admit: 2018-04-25 | Discharge: 2018-04-25 | Disposition: A | Discharge status: Home / Self Care | Payer: Medicare HMO | Source: Ambulatory Visit | Attending: Radiation Oncology | Admitting: Radiation Oncology

## 2018-04-25 DIAGNOSIS — Z510 Encounter for antineoplastic radiation therapy: Secondary | ICD-10-CM | POA: Diagnosis not present

## 2018-04-25 DIAGNOSIS — D0511 Intraductal carcinoma in situ of right breast: Secondary | ICD-10-CM | POA: Diagnosis not present

## 2018-04-25 DIAGNOSIS — Z170 Estrogen receptor positive status [ER+]: Secondary | ICD-10-CM | POA: Diagnosis not present

## 2018-04-25 MED ORDER — MONTELUKAST SODIUM 10 MG PO TABS: 10.0000 mg | ORAL_TABLET | Freq: Every day | ORAL | 3 refills | Status: DC

## 2018-04-26 ENCOUNTER — Ambulatory Visit
Admission: RE | Admit: 2018-04-26 | Discharge: 2018-04-26 | Disposition: A | Discharge status: Home / Self Care | Payer: Medicare HMO | Source: Ambulatory Visit | Attending: Radiation Oncology | Admitting: Radiation Oncology

## 2018-04-26 DIAGNOSIS — Z170 Estrogen receptor positive status [ER+]: Secondary | ICD-10-CM | POA: Diagnosis not present

## 2018-04-26 DIAGNOSIS — Z510 Encounter for antineoplastic radiation therapy: Secondary | ICD-10-CM | POA: Diagnosis not present

## 2018-04-26 DIAGNOSIS — D0511 Intraductal carcinoma in situ of right breast: Secondary | ICD-10-CM | POA: Diagnosis not present

## 2018-04-27 ENCOUNTER — Ambulatory Visit
Admission: RE | Admit: 2018-04-27 | Discharge: 2018-04-27 | Disposition: A | Discharge status: Home / Self Care | Payer: Medicare HMO | Source: Ambulatory Visit | Attending: Radiation Oncology | Admitting: Radiation Oncology

## 2018-04-27 DIAGNOSIS — Z170 Estrogen receptor positive status [ER+]: Secondary | ICD-10-CM | POA: Diagnosis not present

## 2018-04-27 DIAGNOSIS — D0511 Intraductal carcinoma in situ of right breast: Secondary | ICD-10-CM | POA: Diagnosis not present

## 2018-04-27 DIAGNOSIS — Z510 Encounter for antineoplastic radiation therapy: Secondary | ICD-10-CM | POA: Diagnosis not present

## 2018-04-29 ENCOUNTER — Ambulatory Visit: Admission: RE | Admit: 2018-04-29 | Payer: Medicare HMO | Source: Ambulatory Visit

## 2018-04-30 ENCOUNTER — Ambulatory Visit: Payer: Medicare HMO

## 2018-04-30 ENCOUNTER — Ambulatory Visit
Admission: RE | Admit: 2018-04-30 | Discharge: 2018-04-30 | Disposition: A | Discharge status: Home / Self Care | Payer: Medicare HMO | Source: Ambulatory Visit | Attending: Radiation Oncology | Admitting: Radiation Oncology

## 2018-04-30 DIAGNOSIS — Z510 Encounter for antineoplastic radiation therapy: Secondary | ICD-10-CM | POA: Diagnosis not present

## 2018-04-30 DIAGNOSIS — D0511 Intraductal carcinoma in situ of right breast: Secondary | ICD-10-CM | POA: Diagnosis not present

## 2018-04-30 DIAGNOSIS — Z170 Estrogen receptor positive status [ER+]: Secondary | ICD-10-CM | POA: Diagnosis not present

## 2018-05-01 ENCOUNTER — Ambulatory Visit
Admission: RE | Admit: 2018-05-01 | Discharge: 2018-05-01 | Disposition: A | Discharge status: Home / Self Care | Payer: Medicare HMO | Source: Ambulatory Visit | Attending: Radiation Oncology | Admitting: Radiation Oncology

## 2018-05-01 DIAGNOSIS — D0511 Intraductal carcinoma in situ of right breast: Secondary | ICD-10-CM | POA: Diagnosis not present

## 2018-05-01 DIAGNOSIS — Z170 Estrogen receptor positive status [ER+]: Secondary | ICD-10-CM | POA: Diagnosis not present

## 2018-05-01 DIAGNOSIS — Z510 Encounter for antineoplastic radiation therapy: Secondary | ICD-10-CM | POA: Diagnosis not present

## 2018-05-02 ENCOUNTER — Ambulatory Visit: Payer: Medicare HMO

## 2018-05-02 DIAGNOSIS — Z510 Encounter for antineoplastic radiation therapy: Secondary | ICD-10-CM | POA: Diagnosis not present

## 2018-05-02 DIAGNOSIS — D0511 Intraductal carcinoma in situ of right breast: Secondary | ICD-10-CM | POA: Diagnosis not present

## 2018-05-02 DIAGNOSIS — Z170 Estrogen receptor positive status [ER+]: Secondary | ICD-10-CM | POA: Diagnosis not present

## 2018-05-03 ENCOUNTER — Ambulatory Visit: Payer: Medicare HMO

## 2018-05-04 ENCOUNTER — Ambulatory Visit: Payer: Medicare HMO

## 2018-05-07 ENCOUNTER — Ambulatory Visit
Admission: RE | Admit: 2018-05-07 | Discharge: 2018-05-07 | Disposition: A | Discharge status: Home / Self Care | Payer: Medicare HMO | Source: Ambulatory Visit | Attending: Radiation Oncology | Admitting: Radiation Oncology

## 2018-05-07 DIAGNOSIS — Z170 Estrogen receptor positive status [ER+]: Secondary | ICD-10-CM | POA: Diagnosis not present

## 2018-05-07 DIAGNOSIS — D0511 Intraductal carcinoma in situ of right breast: Secondary | ICD-10-CM | POA: Diagnosis not present

## 2018-05-07 DIAGNOSIS — Z510 Encounter for antineoplastic radiation therapy: Secondary | ICD-10-CM | POA: Diagnosis not present

## 2018-05-08 ENCOUNTER — Other Ambulatory Visit: Payer: Self-pay

## 2018-05-08 ENCOUNTER — Inpatient Hospital Stay: Payer: Medicare HMO

## 2018-05-08 ENCOUNTER — Ambulatory Visit
Admission: RE | Admit: 2018-05-08 | Discharge: 2018-05-08 | Disposition: A | Discharge status: Home / Self Care | Payer: Medicare HMO | Source: Ambulatory Visit | Attending: Radiation Oncology | Admitting: Radiation Oncology

## 2018-05-08 DIAGNOSIS — D0511 Intraductal carcinoma in situ of right breast: Secondary | ICD-10-CM

## 2018-05-08 DIAGNOSIS — Z170 Estrogen receptor positive status [ER+]: Secondary | ICD-10-CM | POA: Diagnosis not present

## 2018-05-08 DIAGNOSIS — Z510 Encounter for antineoplastic radiation therapy: Secondary | ICD-10-CM | POA: Diagnosis not present

## 2018-05-08 LAB — CBC
HEMATOCRIT: 45.7 % (ref 35.0–47.0)
Hemoglobin: 15.2 g/dL (ref 12.0–16.0)
MCH: 31.8 pg (ref 26.0–34.0)
MCHC: 33.3 g/dL (ref 32.0–36.0)
MCV: 95.5 fL (ref 80.0–100.0)
PLATELETS: 199 10*3/uL (ref 150–440)
RBC: 4.79 MIL/uL (ref 3.80–5.20)
RDW: 15 % — AB (ref 11.5–14.5)
WBC: 7.6 10*3/uL (ref 3.6–11.0)

## 2018-05-09 ENCOUNTER — Ambulatory Visit
Admission: RE | Admit: 2018-05-09 | Discharge: 2018-05-09 | Disposition: A | Discharge status: Home / Self Care | Payer: Medicare HMO | Source: Ambulatory Visit | Attending: Radiation Oncology | Admitting: Radiation Oncology

## 2018-05-09 DIAGNOSIS — D0511 Intraductal carcinoma in situ of right breast: Secondary | ICD-10-CM | POA: Diagnosis not present

## 2018-05-09 DIAGNOSIS — Z510 Encounter for antineoplastic radiation therapy: Secondary | ICD-10-CM | POA: Diagnosis not present

## 2018-05-09 DIAGNOSIS — Z170 Estrogen receptor positive status [ER+]: Secondary | ICD-10-CM | POA: Diagnosis not present

## 2018-05-10 ENCOUNTER — Ambulatory Visit
Admission: RE | Admit: 2018-05-10 | Discharge: 2018-05-10 | Disposition: A | Discharge status: Home / Self Care | Payer: Medicare HMO | Source: Ambulatory Visit | Attending: Radiation Oncology | Admitting: Radiation Oncology

## 2018-05-10 DIAGNOSIS — Z170 Estrogen receptor positive status [ER+]: Secondary | ICD-10-CM | POA: Diagnosis not present

## 2018-05-10 DIAGNOSIS — D0511 Intraductal carcinoma in situ of right breast: Secondary | ICD-10-CM | POA: Diagnosis not present

## 2018-05-10 DIAGNOSIS — Z510 Encounter for antineoplastic radiation therapy: Secondary | ICD-10-CM | POA: Diagnosis not present

## 2018-05-11 ENCOUNTER — Ambulatory Visit: Payer: Self-pay | Admitting: Nurse Practitioner

## 2018-05-11 ENCOUNTER — Ambulatory Visit
Admission: RE | Admit: 2018-05-11 | Discharge: 2018-05-11 | Disposition: A | Discharge status: Home / Self Care | Payer: Medicare HMO | Source: Ambulatory Visit | Attending: Radiation Oncology | Admitting: Radiation Oncology

## 2018-05-11 DIAGNOSIS — D0511 Intraductal carcinoma in situ of right breast: Secondary | ICD-10-CM | POA: Diagnosis not present

## 2018-05-11 DIAGNOSIS — Z170 Estrogen receptor positive status [ER+]: Secondary | ICD-10-CM | POA: Diagnosis not present

## 2018-05-11 DIAGNOSIS — Z510 Encounter for antineoplastic radiation therapy: Secondary | ICD-10-CM | POA: Diagnosis not present

## 2018-05-15 ENCOUNTER — Ambulatory Visit
Admission: RE | Admit: 2018-05-15 | Discharge: 2018-05-15 | Disposition: A | Discharge status: Home / Self Care | Payer: Medicare HMO | Source: Ambulatory Visit | Attending: Radiation Oncology | Admitting: Radiation Oncology

## 2018-05-15 DIAGNOSIS — Z170 Estrogen receptor positive status [ER+]: Secondary | ICD-10-CM | POA: Diagnosis not present

## 2018-05-15 DIAGNOSIS — Z510 Encounter for antineoplastic radiation therapy: Secondary | ICD-10-CM | POA: Diagnosis not present

## 2018-05-15 DIAGNOSIS — D0511 Intraductal carcinoma in situ of right breast: Secondary | ICD-10-CM | POA: Diagnosis not present

## 2018-05-16 ENCOUNTER — Ambulatory Visit
Admission: RE | Admit: 2018-05-16 | Discharge: 2018-05-16 | Disposition: A | Discharge status: Home / Self Care | Payer: Medicare HMO | Source: Ambulatory Visit | Attending: Radiation Oncology | Admitting: Radiation Oncology

## 2018-05-16 DIAGNOSIS — Z170 Estrogen receptor positive status [ER+]: Secondary | ICD-10-CM | POA: Diagnosis not present

## 2018-05-16 DIAGNOSIS — D0511 Intraductal carcinoma in situ of right breast: Secondary | ICD-10-CM | POA: Diagnosis not present

## 2018-05-16 DIAGNOSIS — Z510 Encounter for antineoplastic radiation therapy: Secondary | ICD-10-CM | POA: Diagnosis not present

## 2018-05-17 ENCOUNTER — Ambulatory Visit
Admission: RE | Admit: 2018-05-17 | Discharge: 2018-05-17 | Disposition: A | Discharge status: Home / Self Care | Payer: Medicare HMO | Source: Ambulatory Visit | Attending: Radiation Oncology | Admitting: Radiation Oncology

## 2018-05-17 DIAGNOSIS — Z170 Estrogen receptor positive status [ER+]: Secondary | ICD-10-CM | POA: Diagnosis not present

## 2018-05-17 DIAGNOSIS — Z510 Encounter for antineoplastic radiation therapy: Secondary | ICD-10-CM | POA: Diagnosis not present

## 2018-05-17 DIAGNOSIS — D0511 Intraductal carcinoma in situ of right breast: Secondary | ICD-10-CM | POA: Diagnosis not present

## 2018-05-18 ENCOUNTER — Other Ambulatory Visit: Payer: Self-pay | Admitting: Nurse Practitioner

## 2018-05-18 ENCOUNTER — Ambulatory Visit
Admission: RE | Admit: 2018-05-18 | Discharge: 2018-05-18 | Disposition: A | Discharge status: Home / Self Care | Payer: Medicare HMO | Source: Ambulatory Visit | Attending: Radiation Oncology | Admitting: Radiation Oncology

## 2018-05-18 DIAGNOSIS — Z170 Estrogen receptor positive status [ER+]: Secondary | ICD-10-CM | POA: Diagnosis not present

## 2018-05-18 DIAGNOSIS — D0511 Intraductal carcinoma in situ of right breast: Secondary | ICD-10-CM | POA: Diagnosis not present

## 2018-05-18 DIAGNOSIS — Z510 Encounter for antineoplastic radiation therapy: Secondary | ICD-10-CM | POA: Diagnosis not present

## 2018-05-18 MED ORDER — SIMVASTATIN 40 MG PO TABS: 40.0000 mg | ORAL_TABLET | Freq: Every day | ORAL | 1 refills | Status: DC

## 2018-05-21 ENCOUNTER — Ambulatory Visit
Admission: RE | Admit: 2018-05-21 | Discharge: 2018-05-21 | Disposition: A | Discharge status: Home / Self Care | Payer: Medicare HMO | Source: Ambulatory Visit | Attending: Radiation Oncology | Admitting: Radiation Oncology

## 2018-05-21 DIAGNOSIS — Z170 Estrogen receptor positive status [ER+]: Secondary | ICD-10-CM | POA: Diagnosis not present

## 2018-05-21 DIAGNOSIS — D0511 Intraductal carcinoma in situ of right breast: Secondary | ICD-10-CM | POA: Diagnosis not present

## 2018-05-21 DIAGNOSIS — Z510 Encounter for antineoplastic radiation therapy: Secondary | ICD-10-CM | POA: Diagnosis not present

## 2018-06-01 ENCOUNTER — Encounter: Payer: Self-pay | Admitting: Nurse Practitioner

## 2018-06-01 ENCOUNTER — Ambulatory Visit (INDEPENDENT_AMBULATORY_CARE_PROVIDER_SITE_OTHER): Payer: Medicare HMO | Admitting: Nurse Practitioner

## 2018-06-01 VITALS — BP 143/89 | HR 65 | Temp 98.7°F | Resp 16 | Ht 60.0 in | Wt 139.2 lb

## 2018-06-01 DIAGNOSIS — J019 Acute sinusitis, unspecified: Secondary | ICD-10-CM | POA: Diagnosis not present

## 2018-06-01 DIAGNOSIS — J0140 Acute pansinusitis, unspecified: Secondary | ICD-10-CM | POA: Insufficient documentation

## 2018-06-01 DIAGNOSIS — F411 Generalized anxiety disorder: Secondary | ICD-10-CM | POA: Diagnosis not present

## 2018-06-01 DIAGNOSIS — C50911 Malignant neoplasm of unspecified site of right female breast: Secondary | ICD-10-CM | POA: Diagnosis not present

## 2018-06-01 DIAGNOSIS — J3089 Other allergic rhinitis: Secondary | ICD-10-CM | POA: Diagnosis not present

## 2018-06-01 MED ORDER — AMOXICILLIN-POT CLAVULANATE 875-125 MG PO TABS: 1.0000 | ORAL_TABLET | Freq: Two times a day (BID) | ORAL | 0 refills | Status: DC

## 2018-06-01 NOTE — Progress Notes (Signed)
[facility] [place], [place] [address]  Internal MEDICINE  Office Visit Note  Patient Name: Michelle Moore  [id number]  [id number]  Date of Service: 06/01/2018  Chief Complaint  Patient presents with  . Medical Management of Chronic Issues    6 month follow up  . Sinusitis    been going on all week   . Ear Problem    ear pain moainly inleft ear started last weekend  . Anxiety  . COPD    Patient finished her radiation treatments. Last treatment was 05/16/2018. Feeling tired, but otherwise ok. Surgery completely removed the breast cancer with fully clear margins.   Sinusitis  This is a new problem. The current episode started 1 to 4 weeks ago. The problem is unchanged. There has been no fever. Associated symptoms include chills, congestion, coughing, ear pain, headaches and sinus pressure. Pertinent negatives include no shortness of breath. Past treatments include acetaminophen (tylenol cold/flu ). The treatment provided mild relief.       Current Medication: Outpatient Encounter Medications as of 06/01/2018  Medication Sig  . ALPRAZolam (XANAX) 0.25 MG tablet Take 1 tablet (0.25 mg total) by mouth 2 (two) times daily as needed for anxiety.  Marland Kitchen aspirin EC 81 MG tablet Take 81 mg by mouth at bedtime.  . Aspirin-Acetaminophen-Caffeine (EXCEDRIN EXTRA STRENGTH PO) Take 2 tablets by mouth daily as needed (for arm and neck pain).  . cetirizine (ZYRTEC) 10 MG tablet Take 10 mg by mouth daily as needed for allergies.   . Chlorphen-Pseudoephed-APAP (CORICIDIN D PO) Take by mouth as needed.  . citalopram (CELEXA) 40 MG tablet Take 40 mg by mouth at bedtime.  Marland Kitchen dextromethorphan (DELSYM) 30 MG/5ML liquid Take 30 mg by mouth as needed for cough.  . fluticasone (FLONASE) 50 MCG/ACT nasal spray Place 2 sprays into both nostrils daily as needed for allergies.   . Fluticasone-Salmeterol (ADVAIR) 250-50 MCG/DOSE AEPB Inhale 1 puff into the lungs 2 (two) times daily.  .  folic acid (FOLVITE) 102 MCG tablet Take 1,600 mcg by mouth daily.  . methotrexate (RHEUMATREX) 2.5 MG tablet TAKE 8 TABLETS WEEKLY ON WEDNESDAYS  . montelukast (SINGULAIR) 10 MG tablet Take 1 tablet (10 mg total) by mouth at bedtime.  . Omeprazole 20 MG TBEC Take 20 mg by mouth every morning.   Marland Kitchen OVER THE COUNTER MEDICATION Take 1 drop by mouth daily. CBD Oil  . Pseudoephedrine-Ibuprofen (ADVIL COLD/SINUS) 30-200 MG TABS Take 1 tablet by mouth as needed.  . simvastatin (ZOCOR) 40 MG tablet Take 1 tablet (40 mg total) by mouth at bedtime.  . traMADol (ULTRAM) 50 MG tablet 2 TAB QD  . amoxicillin-clavulanate (AUGMENTIN) 875-125 MG tablet Take 1 tablet by mouth 2 (two) times daily.  . [DISCONTINUED] amoxicillin-clavulanate (AUGMENTIN) 875-125 MG tablet Take 1 tablet by mouth 2 (two) times daily. (Patient not taking: Reported on 03/06/2018)   No facility-administered encounter medications on file as of 06/01/2018.     Surgical History: Past Surgical History:  Procedure Laterality Date  . ABDOMINAL HYSTERECTOMY    . ANTERIOR CERVICAL DECOMP/DISCECTOMY FUSION N/A 01/20/2014   Procedure: ANTERIOR CERVICAL DECOMPRESSION/DISCECTOMY FUSION 2 LEVELS cervical five/six six Tarry Kos;  Surgeon: Ophelia Charter, MD;  Location: [facility];  Service: Neurosurgery;  Laterality: N/A;  . BREAST BIOPSY Right 02/01/2018   x shape, path pending  . BREAST LUMPECTOMY Right 03/07/2018   Procedure: BREAST LUMPECTOMY/ RE EXCISION;  Surgeon: Herbert Pun, MD;  Location: [facility] ORS;  Service: General;  Laterality: Right;  . CARDIAC CATHETERIZATION     2011  . CORONARY ANGIOPLASTY WITH STENT PLACEMENT     PLACED IN RCA 2011  . EYE SURGERY     lasik  . PARTIAL MASTECTOMY WITH NEEDLE LOCALIZATION Right 02/21/2018   Procedure: PARTIAL MASTECTOMY WITH NEEDLE LOCALIZATION;  Surgeon: Herbert Pun, MD;  Location: [facility] ORS;  Service: General;  Laterality: Right;  . ROTATOR CUFF REPAIR     left shoulder  .  TOE SURGERY Right    has pin and plate in it    Medical History: Past Medical History:  Diagnosis Date  . Anxiety   . Arthritis   . Cancer ([place])    breast  . COPD (chronic obstructive pulmonary disease) ([place])   . Dyspnea    ONLY WITH EXERTION  . GERD (gastroesophageal reflux disease)    "sometimes"  . Headache(784.0)    sinus headaches  . Myocardial infarction ([place])    in 2011  no damage  . Seizures ([place]) 2014   X1  . Sleep apnea    USES CPAP    Family History: Family History  Problem Relation Age of Onset  . Breast cancer Cousin 54       bilateral at 71 and 89; daughter of maternal aunt who was unaffected  . Lung cancer Maternal Aunt        dx 61s; deceased 38s; smoker  . Heart attack Father        deceased 55  . Stomach cancer Maternal Grandmother     Social History   Socioeconomic History  . Marital status: Married    Spouse name: Not on file  . Number of children: Not on file  . Years of education: Not on file  . Highest education level: Not on file  Occupational History  . Not on file  Social Needs  . Financial resource strain: Not on file  . Food insecurity:    Worry: Not on file    Inability: Not on file  . Transportation needs:    Medical: Not on file    Non-medical: Not on file  Tobacco Use  . Smoking status: Former Smoker    Packs/day: 1.00    Years: 30.00    Pack years: 30.00    Types: Cigarettes    Last attempt to quit: 09/13/2015    Years since quitting: 2.7  . Smokeless tobacco: Never Used  Substance and Sexual Activity  . Alcohol use: Yes    Comment: occasional  . Drug use: No  . Sexual activity: Not on file  Lifestyle  . Physical activity:    Days per week: Not on file    Minutes per session: Not on file  . Stress: Not on file  Relationships  . Social connections:    Talks on phone: Not on file    Gets together: Not on file    Attends religious service: Not on file    Active member of club or organization: Not on file     Attends meetings of clubs or organizations: Not on file    Relationship status: Not on file  . Intimate partner violence:    Fear of current or ex partner: Not on file    Emotionally abused: Not on file    Physically abused: Not on file    Forced sexual activity: Not on file  Other Topics Concern  . Not on file  Social History Narrative  . Not on file  Review of Systems  Constitutional: Positive for chills and fatigue. Negative for activity change and fever.  HENT: Positive for congestion, ear pain, postnasal drip, rhinorrhea, sinus pressure and sinus pain.   Eyes: Negative.   Respiratory: Positive for apnea and cough. Negative for shortness of breath and wheezing.   Cardiovascular: Negative for chest pain and palpitations.  Gastrointestinal: Negative for constipation, diarrhea, nausea and vomiting.  Endocrine: Negative for cold intolerance, heat intolerance, polydipsia, polyphagia and polyuria.  Genitourinary: Negative.   Musculoskeletal: Positive for arthralgias, back pain and myalgias.  Allergic/Immunologic: Positive for environmental allergies.  Neurological: Positive for headaches.  Hematological: Positive for adenopathy.  Psychiatric/Behavioral: The patient is nervous/anxious.     Today's Vitals   06/01/18 1002  BP: (!) 143/89  Pulse: 65  Resp: 16  Temp: 98.7 F (37.1 C)  SpO2: 93%  Weight: 139 lb 3.2 oz (63.1 kg)  Height: 5' (1.524 m)    Physical Exam  Constitutional: She is oriented to person, place, and time. She appears well-developed and well-nourished. She appears ill. No distress.  HENT:  Head: Normocephalic and atraumatic.  Right Ear: Tympanic membrane is erythematous and bulging.  Left Ear: Tympanic membrane is erythematous and bulging.  Nose: Rhinorrhea present. Right sinus exhibits maxillary sinus tenderness and frontal sinus tenderness. Left sinus exhibits maxillary sinus tenderness and frontal sinus tenderness.  Mouth/Throat: Oropharynx is  clear and moist. No oropharyngeal exudate.  Eyes: Pupils are equal, round, and reactive to light. Conjunctivae and EOM are normal.  Neck: Normal range of motion. Neck supple. No JVD present. Carotid bruit is not present. No tracheal deviation present. No thyromegaly present.  Cardiovascular: Normal rate, regular rhythm and normal heart sounds. Exam reveals no gallop and no friction rub.  No murmur heard. Pulmonary/Chest: Effort normal and breath sounds normal. No respiratory distress. She has no wheezes. She has no rales. She exhibits no tenderness. Right breast exhibits no inverted nipple, no mass, no nipple discharge, no skin change and no tenderness. Left breast exhibits no inverted nipple, no mass, no nipple discharge, no skin change and no tenderness.  Congested, non-productive cough noted   Abdominal: Soft. Bowel sounds are normal. There is no tenderness.  Musculoskeletal: Normal range of motion.  Lymphadenopathy:    She has cervical adenopathy.  Neurological: She is alert and oriented to person, place, and time. No cranial nerve deficit.  Skin: Skin is warm and dry. Capillary refill takes less than 2 seconds. She is not diaphoretic.  Psychiatric: She has a normal mood and affect. Her behavior is normal. Judgment and thought content normal.  Nursing note and vitals reviewed.  Assessment/Plan: 1. Acute sinusitis with symptoms > 10 days Start augmentin 875mg  bid for 10 days. Use OTC medication to help with symptoms.  - amoxicillin-clavulanate (AUGMENTIN) 875-125 MG tablet; Take 1 tablet by mouth 2 (two) times daily.  Dispense: 10 tablet; Refill: 0  2. Perennial allergic rhinitis Continue to use zyrtec every day  3. Generalized anxiety disorder May continue to take alprazolam as needed and as prescribed  4. Malignant neoplasm of right female breast, unspecified estrogen receptor status, unspecified site of breast [facility]) Regular visits with oncology and surgery as scheduled.   General  Counseling: lizzie an understanding of the findings of todays visit and agrees with plan of treatment. I have discussed any further diagnostic evaluation that may be needed or ordered today. We also reviewed her medications today. she has been encouraged to call the office with any questions or concerns that should  arise related to todays visit.   Rest and increase fluids. Continue using OTC medication to control symptoms.   This patient was seen by [place] with Dr Lavera Guise as a part of collaborative care agreement  Meds ordered this encounter  Medications  . amoxicillin-clavulanate (AUGMENTIN) 875-125 MG tablet    Sig: Take 1 tablet by mouth 2 (two) times daily.    Dispense:  10 tablet    Refill:  0    Order Specific Question:   Supervising Provider    Answer:   Lavera Guise [[id number]]    Time spent: 67 Minutes      Dr Lavera Guise Internal medicine

## 2018-06-07 DIAGNOSIS — G4733 Obstructive sleep apnea (adult) (pediatric): Secondary | ICD-10-CM | POA: Diagnosis not present

## 2018-06-07 DIAGNOSIS — E782 Mixed hyperlipidemia: Secondary | ICD-10-CM | POA: Diagnosis not present

## 2018-06-07 DIAGNOSIS — I2510 Atherosclerotic heart disease of native coronary artery without angina pectoris: Secondary | ICD-10-CM | POA: Diagnosis not present

## 2018-06-07 DIAGNOSIS — Z9989 Dependence on other enabling machines and devices: Secondary | ICD-10-CM | POA: Diagnosis not present

## 2018-06-07 DIAGNOSIS — I6523 Occlusion and stenosis of bilateral carotid arteries: Secondary | ICD-10-CM | POA: Diagnosis not present

## 2018-06-14 ENCOUNTER — Other Ambulatory Visit: Payer: Self-pay

## 2018-06-14 DIAGNOSIS — Z79899 Other long term (current) drug therapy: Secondary | ICD-10-CM | POA: Diagnosis not present

## 2018-06-14 DIAGNOSIS — M0579 Rheumatoid arthritis with rheumatoid factor of multiple sites without organ or systems involvement: Secondary | ICD-10-CM | POA: Diagnosis not present

## 2018-06-14 DIAGNOSIS — Z853 Personal history of malignant neoplasm of breast: Secondary | ICD-10-CM | POA: Diagnosis not present

## 2018-06-14 MED ORDER — LEVOFLOXACIN 500 MG PO TABS: ORAL_TABLET | ORAL | 0 refills | Status: DC

## 2018-06-14 MED ORDER — FLUCONAZOLE 150 MG PO TABS: ORAL_TABLET | ORAL | 0 refills | Status: DC

## 2018-06-14 NOTE — Telephone Encounter (Signed)
Pt called that still having sinus infection, ringing in ear and ear clogged as per DR Humphrey Rolls we send levaquin and diflucan and advised pt we she don't feel better she need to see ent or might need Ct for sinus

## 2018-06-18 NOTE — Progress Notes (Deleted)
[place]  [phone] [phone]  ID: Michelle Moore OB: 1956-05-05  MR#: [id number]  ZGY#:[id number]  Patient Care Team: Lavera Guise, MD as PCP - General (Internal Medicine)  CHIEF COMPLAINT: DCIS, right breast.  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  She was initially evaluated by a different provider.  She continues to have breast tenderness, but otherwise feels well.  She has no neurologic complaints.  She denies any recent fevers or illnesses.  She has a good appetite and denies weight loss.  She has no chest pain or shortness of breath.  She denies any nausea, vomiting, constipation, or diarrhea.  She has no urinary complaints.  Patient feels at her baseline offers no further specific complaints today.  REVIEW OF SYSTEMS:   Review of Systems  Constitutional: Negative.  Negative for fever, malaise/fatigue and weight loss.  Respiratory: Negative.  Negative for cough, hemoptysis and shortness of breath.   Cardiovascular: Negative.  Negative for chest pain and leg swelling.  Gastrointestinal: Negative.  Negative for abdominal pain.  Genitourinary: Negative.  Negative for dysuria.  Musculoskeletal: Negative.   Skin: Negative.  Negative for rash.  Neurological: Negative.  Negative for sensory change, focal weakness, weakness and headaches.  Psychiatric/Behavioral: Negative.  The patient is not nervous/anxious.     As per HPI. Otherwise, a complete review of systems is negative.  PAST MEDICAL HISTORY: Past Medical History:  Diagnosis Date  . Anxiety   . Arthritis   . Cancer ([place])    breast  . COPD (chronic obstructive pulmonary disease) ([place])   . Dyspnea    ONLY WITH EXERTION  . GERD (gastroesophageal reflux disease)    "sometimes"  . Headache(784.0)    sinus headaches  . Myocardial infarction ([place])    in 2011  no damage  . Seizures ([place]) 2014   X1  .  Sleep apnea    USES CPAP    PAST SURGICAL HISTORY: Past Surgical History:  Procedure Laterality Date  . ABDOMINAL HYSTERECTOMY    . ANTERIOR CERVICAL DECOMP/DISCECTOMY FUSION N/A 01/20/2014   Procedure: ANTERIOR CERVICAL DECOMPRESSION/DISCECTOMY FUSION 2 LEVELS cervical five/six six Tarry Kos;  Surgeon: Ophelia Charter, MD;  Location: [facility];  Service: Neurosurgery;  Laterality: N/A;  . BREAST BIOPSY Right 02/01/2018   x shape, path pending  . BREAST LUMPECTOMY Right 03/07/2018   Procedure: BREAST LUMPECTOMY/ RE EXCISION;  Surgeon: Herbert Pun, MD;  Location: [facility] ORS;  Service: General;  Laterality: Right;  . CARDIAC CATHETERIZATION     2011  . CORONARY ANGIOPLASTY WITH STENT PLACEMENT     PLACED IN RCA 2011  . EYE SURGERY     lasik  . PARTIAL MASTECTOMY WITH NEEDLE LOCALIZATION Right 02/21/2018   Procedure: PARTIAL MASTECTOMY WITH NEEDLE LOCALIZATION;  Surgeon: Herbert Pun, MD;  Location: [facility] ORS;  Service: General;  Laterality: Right;  . ROTATOR CUFF REPAIR     left shoulder  . TOE SURGERY Right    has pin and plate in it    FAMILY HISTORY: Family History  Problem Relation Age of Onset  . Breast cancer Cousin 29       bilateral at 85 and 71; daughter of maternal aunt who was unaffected  . Lung cancer Maternal Aunt        dx 33s; deceased 67s; smoker  . Heart attack Father        deceased 8  . Stomach cancer Maternal Grandmother  ADVANCED DIRECTIVES (Y/N):  N  HEALTH MAINTENANCE: Social History   Tobacco Use  . Smoking status: Former Smoker    Packs/day: 1.00    Years: 30.00    Pack years: 30.00    Types: Cigarettes    Last attempt to quit: 09/13/2015    Years since quitting: 2.7  . Smokeless tobacco: Never Used  Substance Use Topics  . Alcohol use: Yes    Comment: occasional  . Drug use: No     Colonoscopy:  PAP:  Bone density:  Lipid panel:  Allergies  Allergen Reactions  . Other     BANDAIDS-OF LEFT ON FOR AN EXTENDED  PERIOD OF TIME    Current Outpatient Medications  Medication Sig Dispense Refill  . ALPRAZolam (XANAX) 0.25 MG tablet Take 1 tablet (0.25 mg total) by mouth 2 (two) times daily as needed for anxiety. 180 tablet 1  . amoxicillin-clavulanate (AUGMENTIN) 875-125 MG tablet Take 1 tablet by mouth 2 (two) times daily. (Patient not taking: Reported on 06/14/2018) 10 tablet 0  . aspirin EC 81 MG tablet Take 81 mg by mouth at bedtime.    . Aspirin-Acetaminophen-Caffeine (EXCEDRIN EXTRA STRENGTH PO) Take 2 tablets by mouth daily as needed (for arm and neck pain).    . cetirizine (ZYRTEC) 10 MG tablet Take 10 mg by mouth daily as needed for allergies.     . Chlorphen-Pseudoephed-APAP (CORICIDIN D PO) Take by mouth as needed.    . citalopram (CELEXA) 40 MG tablet Take 40 mg by mouth at bedtime.    Marland Kitchen dextromethorphan (DELSYM) 30 MG/5ML liquid Take 30 mg by mouth as needed for cough.    . fluconazole (DIFLUCAN) 150 MG tablet Take 1 tab one time and then take another in 3 days  if symptoms persist 3 tablet 0  . fluticasone (FLONASE) 50 MCG/ACT nasal spray Place 2 sprays into both nostrils daily as needed for allergies.     . Fluticasone-Salmeterol (ADVAIR) 250-50 MCG/DOSE AEPB Inhale 1 puff into the lungs 2 (two) times daily. 60 each 5  . folic acid (FOLVITE) 616 MCG tablet Take 1,600 mcg by mouth daily.    Marland Kitchen levofloxacin (LEVAQUIN) 500 MG tablet Take 1 tab po daily for 10 days 10 tablet 0  . methotrexate (RHEUMATREX) 2.5 MG tablet TAKE 8 TABLETS WEEKLY ON WEDNESDAYS    . montelukast (SINGULAIR) 10 MG tablet Take 1 tablet (10 mg total) by mouth at bedtime. 30 tablet 3  . Omeprazole 20 MG TBEC Take 20 mg by mouth every morning.     Marland Kitchen OVER THE COUNTER MEDICATION Take 1 drop by mouth daily. CBD Oil    . Pseudoephedrine-Ibuprofen (ADVIL COLD/SINUS) 30-200 MG TABS Take 1 tablet by mouth as needed.    . simvastatin (ZOCOR) 40 MG tablet Take 1 tablet (40 mg total) by mouth at bedtime. 90 tablet 1  . traMADol  (ULTRAM) 50 MG tablet 2 TAB QD     No current facility-administered medications for this visit.     OBJECTIVE: There were no vitals filed for this visit.   There is no height or weight on file to calculate BMI.    ECOG FS:0 - Asymptomatic  General: Well-developed, well-nourished, no acute distress. Eyes: Pink conjunctiva, anicteric sclera. Breast: Patient requested exam be deferred today. HEENT: Normocephalic, moist mucous membranes, clear oropharnyx. Lungs: Clear to auscultation bilaterally. Heart: Regular rate and rhythm. No rubs, murmurs, or gallops. Abdomen: Soft, nontender, nondistended. No organomegaly noted, normoactive bowel sounds. Musculoskeletal: No edema, cyanosis, or  clubbing. Neuro: Alert, answering all questions appropriately. Cranial nerves grossly intact. Skin: No rashes or petechiae noted. Psych: Normal affect. Lymphatics: No cervical, calvicular, axillary or inguinal LAD.   LAB RESULTS:  Lab Results  Component Value Date   NA 141 02/14/2018   K 3.6 02/14/2018   CL 104 02/14/2018   CO2 25 02/14/2018   GLUCOSE 92 02/14/2018   BUN 8 02/14/2018   CREATININE 0.59 02/14/2018   CALCIUM 9.4 02/14/2018   PROT 7.9 02/14/2018   ALBUMIN 4.4 02/14/2018   AST 22 02/14/2018   ALT 23 02/14/2018   ALKPHOS 84 02/14/2018   BILITOT 0.4 02/14/2018   GFRNONAA >60 02/14/2018   GFRAA >60 02/14/2018    Lab Results  Component Value Date   WBC 7.6 05/08/2018   NEUTROABS 13.6 (H) 02/14/2018   HGB 15.2 05/08/2018   HCT 45.7 05/08/2018   MCV 95.5 05/08/2018   PLT 199 05/08/2018     STUDIES: No results found.  ASSESSMENT: DCIS, right breast.  PLAN:    1. DCIS, right breast: Pathology and imaging reviewed independently.  No invasive component was noted.  Because of this, she does not require adjuvant chemotherapy.  Patient has consultation with radiation oncology on March 26, 2018.  Because her DCIS is ER positive (PR negative), she will benefit from tamoxifen for a  total of 5 years at the conclusion of her XRT.  Return to clinic in 3 months for further evaluation and initiation of tamoxifen  I spent a total of 30 minutes face-to-face with the patient of which greater than 50% of the visit was spent in counseling and coordination of care as detailed above.  Patient expressed understanding and was in agreement with this plan. She also understands that She can call clinic at any time with any questions, concerns, or complaints.   Cancer Staging Ductal carcinoma in situ (DCIS) of right breast Staging form: Breast, AJCC 8th Edition - Clinical: Stage 0 (cTis (DCIS), cN0, cM0, ER+, PR-, HER2-) - Signed by Lloyd Huger, MD on 03/18/2018   Lloyd Huger, MD   06/18/2018 12:47 PM

## 2018-06-22 ENCOUNTER — Other Ambulatory Visit: Payer: Self-pay | Admitting: Physician Assistant

## 2018-06-22 ENCOUNTER — Inpatient Hospital Stay: Payer: Medicare HMO | Admitting: Oncology

## 2018-06-22 DIAGNOSIS — H9042 Sensorineural hearing loss, unilateral, left ear, with unrestricted hearing on the contralateral side: Secondary | ICD-10-CM

## 2018-06-22 DIAGNOSIS — IMO0001 Reserved for inherently not codable concepts without codable children: Secondary | ICD-10-CM

## 2018-06-22 DIAGNOSIS — H6980 Other specified disorders of Eustachian tube, unspecified ear: Secondary | ICD-10-CM | POA: Diagnosis not present

## 2018-06-22 DIAGNOSIS — H903 Sensorineural hearing loss, bilateral: Secondary | ICD-10-CM | POA: Diagnosis not present

## 2018-06-24 NOTE — Progress Notes (Signed)
[place]  [phone]) [phone] Fax:(336) [phone]  ID: Michelle Moore OB: 1955/10/23  MR#: [id number]  PXT#:[id number]  Patient Care Team: Lavera Guise, MD as PCP - General (Internal Medicine)  CHIEF COMPLAINT: DCIS, right breast.  INTERVAL HISTORY: Patient returns to clinic today for further evaluation and initiation of tamoxifen.  She recently finished XRT several weeks ago and tolerated her treatments well only with some mild skin breakdown and erythema.  She currently feels well and is asymptomatic. She has no neurologic complaints.  She denies any recent fevers or illnesses.  She has a good appetite and denies weight loss.  She has no chest pain or shortness of breath.  She denies any nausea, vomiting, constipation, or diarrhea.  She has no urinary complaints.  Patient feels at her baseline offers no specific complaints today.  REVIEW OF SYSTEMS:   Review of Systems  Constitutional: Negative.  Negative for fever, malaise/fatigue and weight loss.  Respiratory: Negative.  Negative for cough, hemoptysis and shortness of breath.   Cardiovascular: Negative.  Negative for chest pain and leg swelling.  Gastrointestinal: Negative.  Negative for abdominal pain.  Genitourinary: Negative.  Negative for dysuria.  Musculoskeletal: Negative.  Negative for back pain.  Skin: Negative.  Negative for rash.  Neurological: Negative.  Negative for sensory change, focal weakness, weakness and headaches.  Psychiatric/Behavioral: Negative.  The patient is not nervous/anxious.     As per HPI. Otherwise, a complete review of systems is negative.  PAST MEDICAL HISTORY: Past Medical History:  Diagnosis Date  . Anxiety   . Arthritis   . Cancer ([place])    breast  . COPD (chronic obstructive pulmonary disease) ([place])   . Dyspnea    ONLY WITH EXERTION  . GERD (gastroesophageal reflux disease)    "sometimes"  . Headache(784.0)    sinus headaches  . Myocardial infarction ([place])    in 2011  no damage  . Seizures ([place]) 2014   X1  . Sleep apnea    USES CPAP    PAST SURGICAL HISTORY: Past Surgical History:  Procedure Laterality Date  . ABDOMINAL HYSTERECTOMY    . ANTERIOR CERVICAL DECOMP/DISCECTOMY FUSION N/A 01/20/2014   Procedure: ANTERIOR CERVICAL DECOMPRESSION/DISCECTOMY FUSION 2 LEVELS cervical five/six six Tarry Kos;  Surgeon: Ophelia Charter, MD;  Location: [facility];  Service: Neurosurgery;  Laterality: N/A;  . BREAST BIOPSY Right 02/01/2018   x shape, path pending  . BREAST LUMPECTOMY Right 03/07/2018   Procedure: BREAST LUMPECTOMY/ RE EXCISION;  Surgeon: Herbert Pun, MD;  Location: [facility];  Service: General;  Laterality: Right;  . CARDIAC CATHETERIZATION     2011  . CORONARY ANGIOPLASTY WITH STENT PLACEMENT     PLACED IN RCA 2011  . EYE SURGERY     lasik  . PARTIAL MASTECTOMY WITH NEEDLE LOCALIZATION Right 02/21/2018   Procedure: PARTIAL MASTECTOMY WITH NEEDLE LOCALIZATION;  Surgeon: Herbert Pun, MD;  Location: [facility] ORS;  Service: General;  Laterality: Right;  . ROTATOR CUFF REPAIR     left shoulder  . TOE SURGERY Right    has pin and plate in it    FAMILY HISTORY: Family History  Problem Relation Age of Onset  . Breast cancer Cousin 66       bilateral at 20 and 42; daughter of maternal aunt who was unaffected  . Lung cancer Maternal Aunt        dx 64s; deceased 28s; smoker  . Heart attack Father  deceased 46  . Stomach cancer Maternal Grandmother     ADVANCED DIRECTIVES (Y/N):  N  HEALTH MAINTENANCE: Social History   Tobacco Use  . Smoking status: Former Smoker    Packs/day: 1.00    Years: 30.00    Pack years: 30.00    Types: Cigarettes    Last attempt to quit: 09/13/2015    Years since quitting: 2.7  . Smokeless tobacco: Never Used  Substance Use Topics  . Alcohol use: Yes    Comment: occasional  . Drug use: No     Colonoscopy:  PAP:  Bone density:  Lipid panel:  Allergies  Allergen Reactions   . Other     BANDAIDS-OF LEFT ON FOR AN EXTENDED PERIOD OF TIME    Current Outpatient Medications  Medication Sig Dispense Refill  . ALPRAZolam (XANAX) 0.25 MG tablet Take 1 tablet (0.25 mg total) by mouth 2 (two) times daily as needed for anxiety. 180 tablet 1  . aspirin EC 81 MG tablet Take 81 mg by mouth at bedtime.    . Aspirin-Acetaminophen-Caffeine (EXCEDRIN EXTRA STRENGTH PO) Take 2 tablets by mouth daily as needed (for arm and neck pain).    . cetirizine (ZYRTEC) 10 MG tablet Take 10 mg by mouth daily as needed for allergies.     . citalopram (CELEXA) 40 MG tablet Take 40 mg by mouth at bedtime.    Marland Kitchen dextromethorphan (DELSYM) 30 MG/5ML liquid Take 30 mg by mouth as needed for cough.    . Fluticasone-Salmeterol (ADVAIR) 250-50 MCG/DOSE AEPB Inhale 1 puff into the lungs 2 (two) times daily. 60 each 5  . folic acid (FOLVITE) 063 MCG tablet Take 1,600 mcg by mouth daily.    . methotrexate (RHEUMATREX) 2.5 MG tablet TAKE 8 TABLETS WEEKLY ON WEDNESDAYS    . montelukast (SINGULAIR) 10 MG tablet Take 1 tablet (10 mg total) by mouth at bedtime. 30 tablet 3  . Omeprazole 20 MG TBEC Take 20 mg by mouth every morning.     Marland Kitchen OVER THE COUNTER MEDICATION Take 1 drop by mouth daily. CBD Oil    . predniSONE (STERAPRED UNI-PAK 21 TAB) 10 MG (21) TBPK tablet Take 10 mg by mouth daily. Taper    . Pseudoephedrine-Ibuprofen (ADVIL COLD/SINUS) 30-200 MG TABS Take 1 tablet by mouth as needed.    . simvastatin (ZOCOR) 40 MG tablet Take 1 tablet (40 mg total) by mouth at bedtime. 90 tablet 1  . traMADol (ULTRAM) 50 MG tablet 2 TAB QD    . Chlorphen-Pseudoephed-APAP (CORICIDIN D PO) Take by mouth as needed.    . fluticasone (FLONASE) 50 MCG/ACT nasal spray Place 2 sprays into both nostrils daily as needed for allergies.     . tamoxifen (NOLVADEX) 20 MG tablet Take 1 tablet (20 mg total) by mouth daily. 30 tablet 3   No current facility-administered medications for this visit.     OBJECTIVE: Vitals:    06/29/18 1121  BP: 134/78  Pulse: 69  Resp: 18  Temp: (!) 97.2 F (36.2 C)     Body mass index is 28.03 kg/m.    ECOG FS:0 - Asymptomatic  General: Well-developed, well-nourished, no acute distress. Eyes: Pink conjunctiva, anicteric sclera. HEENT: Normocephalic, moist mucous membranes. Breast: Exam deferred today. Lungs: Clear to auscultation bilaterally. Heart: Regular rate and rhythm. No rubs, murmurs, or gallops. Abdomen: Soft, nontender, nondistended. No organomegaly noted, normoactive bowel sounds. Musculoskeletal: No edema, cyanosis, or clubbing. Neuro: Alert, answering all questions appropriately. Cranial nerves grossly intact. Skin:  No rashes or petechiae noted. Psych: Normal affect.  LAB RESULTS:  Lab Results  Component Value Date   NA 141 02/14/2018   K 3.6 02/14/2018   CL 104 02/14/2018   CO2 25 02/14/2018   GLUCOSE 92 02/14/2018   BUN 8 02/14/2018   CREATININE 0.59 02/14/2018   CALCIUM 9.4 02/14/2018   PROT 7.9 02/14/2018   ALBUMIN 4.4 02/14/2018   AST 22 02/14/2018   ALT 23 02/14/2018   ALKPHOS 84 02/14/2018   BILITOT 0.4 02/14/2018   GFRNONAA >60 02/14/2018   GFRAA >60 02/14/2018    Lab Results  Component Value Date   WBC 7.6 05/08/2018   NEUTROABS 13.6 (H) 02/14/2018   HGB 15.2 05/08/2018   HCT 45.7 05/08/2018   MCV 95.5 05/08/2018   PLT 199 05/08/2018     STUDIES: No results found.  ASSESSMENT: DCIS, right breast.  (ER positive, PR negative)  PLAN:    1. DCIS, right breast: Patient underwent lumpectomy followed by adjuvant XRT which she completed in approximately September 2019.  Because there was no invasive component on her pathology, she did not require adjuvant chemotherapy.  Patient was given a prescription for tamoxifen today which she will take for a total of 5 years completing in October 2024.  No further interventions are needed at this time.  Return to clinic in 3 months for routine evaluation.    I spent a total of 30 minutes  face-to-face with the patient of which greater than 50% of the visit was spent in counseling and coordination of care as detailed above.  Patient expressed understanding and was in agreement with this plan. She also understands that She can call clinic at any time with any questions, concerns, or complaints.   Cancer Staging Ductal carcinoma in situ (DCIS) of right breast Staging form: Breast, AJCC 8th Edition - Clinical: Stage 0 (cTis (DCIS), cN0, cM0, ER+, PR-, HER2-) - Signed by Lloyd Huger, MD on 03/18/2018   Lloyd Huger, MD   06/30/2018 10:36 AM

## 2018-06-27 DIAGNOSIS — M5030 Other cervical disc degeneration, unspecified cervical region: Secondary | ICD-10-CM | POA: Diagnosis not present

## 2018-06-27 DIAGNOSIS — M5136 Other intervertebral disc degeneration, lumbar region: Secondary | ICD-10-CM | POA: Diagnosis not present

## 2018-06-27 DIAGNOSIS — M5416 Radiculopathy, lumbar region: Secondary | ICD-10-CM | POA: Diagnosis not present

## 2018-06-27 DIAGNOSIS — M5412 Radiculopathy, cervical region: Secondary | ICD-10-CM | POA: Diagnosis not present

## 2018-06-28 ENCOUNTER — Encounter: Payer: Self-pay | Admitting: Radiation Oncology

## 2018-06-28 ENCOUNTER — Other Ambulatory Visit: Payer: Self-pay

## 2018-06-28 ENCOUNTER — Ambulatory Visit
Admission: RE | Admit: 2018-06-28 | Discharge: 2018-06-28 | Disposition: A | Discharge status: Home / Self Care | Payer: Medicare HMO | Source: Ambulatory Visit | Attending: Radiation Oncology | Admitting: Radiation Oncology

## 2018-06-28 VITALS — BP 139/67 | HR 84 | Temp 99.1°F | Resp 18 | Wt 143.8 lb

## 2018-06-28 DIAGNOSIS — D0511 Intraductal carcinoma in situ of right breast: Secondary | ICD-10-CM | POA: Diagnosis not present

## 2018-06-28 NOTE — Progress Notes (Signed)
Radiation Oncology Follow up Note  Name: Michelle Moore   Date:   06/28/2018 MRN:  [id number] DOB: 08/19/1956    This 62 y.o. female presents to the clinic today for one-month follow-up status post whole breast radiation to her right breast for ER/PR positive ductal carcinoma in situ.  REFERRING PROVIDER: Lavera Guise, MD  HPI: patient is a 62 year old female now out 1 month having completed whole breast radiation to her right breast for ER/PR positive ductal carcinoma in situ. She seen today in routine follow-up and is doing well. She specifically denies breast tenderness cough or bone pain. She's not yet started on anti-estrogen therapy..  COMPLICATIONS OF TREATMENT: none  FOLLOW UP COMPLIANCE: keeps appointments   PHYSICAL EXAM:  BP 139/67 (BP Location: Left Arm, Patient Position: Sitting)   Pulse 84   Temp 99.1 F (37.3 C) (Tympanic)   Resp 18   Wt 143 lb 13.6 oz (65.2 kg)   BMI 28.09 kg/m  Lungs are clear to A&P cardiac examination essentially unremarkable with regular rate and rhythm. No dominant mass or nodularity is noted in either breast in 2 positions examined. Incision is well-healed. No axillary or supraclavicular adenopathy is appreciated. Cosmetic result is excellent.Well-developed well-nourished patient in NAD. HEENT reveals PERLA, EOMI, discs not visualized.  Oral cavity is clear. No oral mucosal lesions are identified. Neck is clear without evidence of cervical or supraclavicular adenopathy. Lungs are clear to A&P. Cardiac examination is essentially unremarkable with regular rate and rhythm without murmur rub or thrill. Abdomen is benign with no organomegaly or masses noted. Motor sensory and DTR levels are equal and symmetric in the upper and lower extremities. Cranial nerves II through XII are grossly intact. Proprioception is intact. No peripheral adenopathy or edema is identified. No motor or sensory levels are noted. Crude visual fields are within normal  range.  RADIOLOGY RESULTS: no current films for review  PLAN: present time patient is doing well 1 month out. She is seeing medical oncology for recognition of antiestrogen therapy. Otherwise I'm please were overall progress. I've asked to see around 4-5 months for follow-up. Patient is to call with any concerns at any time.  I would like to take this opportunity to thank you for allowing me to participate in the care of your patient.Noreene Filbert, MD

## 2018-06-29 ENCOUNTER — Inpatient Hospital Stay: Payer: Medicare HMO | Attending: Oncology | Admitting: Oncology

## 2018-06-29 ENCOUNTER — Encounter: Payer: Self-pay | Admitting: Oncology

## 2018-06-29 VITALS — BP 134/78 | HR 69 | Temp 97.2°F | Resp 18 | Wt 143.5 lb

## 2018-06-29 DIAGNOSIS — D0511 Intraductal carcinoma in situ of right breast: Secondary | ICD-10-CM

## 2018-06-29 DIAGNOSIS — Z170 Estrogen receptor positive status [ER+]: Secondary | ICD-10-CM | POA: Diagnosis not present

## 2018-06-29 MED ORDER — TAMOXIFEN CITRATE 20 MG PO TABS: 20.0000 mg | ORAL_TABLET | Freq: Every day | ORAL | 3 refills | Status: DC

## 2018-06-29 NOTE — Progress Notes (Signed)
Pt in for follow up, denies any difficulties or concerns today. 

## 2018-07-04 ENCOUNTER — Ambulatory Visit
Admission: RE | Admit: 2018-07-04 | Discharge: 2018-07-04 | Disposition: A | Discharge status: Home / Self Care | Payer: Medicare HMO | Source: Ambulatory Visit | Attending: Physician Assistant | Admitting: Physician Assistant

## 2018-07-04 DIAGNOSIS — J329 Chronic sinusitis, unspecified: Secondary | ICD-10-CM | POA: Diagnosis not present

## 2018-07-04 DIAGNOSIS — IMO0001 Reserved for inherently not codable concepts without codable children: Secondary | ICD-10-CM

## 2018-07-04 DIAGNOSIS — H9042 Sensorineural hearing loss, unilateral, left ear, with unrestricted hearing on the contralateral side: Secondary | ICD-10-CM | POA: Diagnosis not present

## 2018-07-04 DIAGNOSIS — H9312 Tinnitus, left ear: Secondary | ICD-10-CM | POA: Diagnosis not present

## 2018-07-04 MED ORDER — GADOBUTROL 1 MMOL/ML IV SOLN
6.5000 mL | Freq: Once | INTRAVENOUS | Status: AC | PRN
  Administered 2018-07-04: 6.5 mL via INTRAVENOUS

## 2018-07-05 DIAGNOSIS — D171 Benign lipomatous neoplasm of skin and subcutaneous tissue of trunk: Secondary | ICD-10-CM | POA: Diagnosis not present

## 2018-07-12 DIAGNOSIS — H90A22 Sensorineural hearing loss, unilateral, left ear, with restricted hearing on the contralateral side: Secondary | ICD-10-CM | POA: Diagnosis not present

## 2018-07-12 DIAGNOSIS — E236 Other disorders of pituitary gland: Secondary | ICD-10-CM | POA: Diagnosis not present

## 2018-07-12 DIAGNOSIS — J329 Chronic sinusitis, unspecified: Secondary | ICD-10-CM | POA: Diagnosis not present

## 2018-07-12 DIAGNOSIS — H9120 Sudden idiopathic hearing loss, unspecified ear: Secondary | ICD-10-CM | POA: Diagnosis not present

## 2018-07-20 ENCOUNTER — Other Ambulatory Visit: Payer: Self-pay

## 2018-07-20 DIAGNOSIS — G4733 Obstructive sleep apnea (adult) (pediatric): Secondary | ICD-10-CM | POA: Diagnosis not present

## 2018-07-20 MED ORDER — FLUTICASONE-SALMETEROL 250-50 MCG/DOSE IN AEPB: 1.0000 | INHALATION_SPRAY | Freq: Two times a day (BID) | RESPIRATORY_TRACT | 5 refills | Status: DC

## 2018-07-27 DIAGNOSIS — J329 Chronic sinusitis, unspecified: Secondary | ICD-10-CM | POA: Diagnosis not present

## 2018-07-27 DIAGNOSIS — H9122 Sudden idiopathic hearing loss, left ear: Secondary | ICD-10-CM | POA: Diagnosis not present

## 2018-08-02 DIAGNOSIS — H9122 Sudden idiopathic hearing loss, left ear: Secondary | ICD-10-CM | POA: Diagnosis not present

## 2018-08-08 DIAGNOSIS — H8102 Meniere's disease, left ear: Secondary | ICD-10-CM | POA: Diagnosis not present

## 2018-08-15 ENCOUNTER — Other Ambulatory Visit: Payer: Self-pay

## 2018-08-16 ENCOUNTER — Other Ambulatory Visit: Payer: Self-pay

## 2018-08-16 MED ORDER — CITALOPRAM HYDROBROMIDE 40 MG PO TABS: 40.0000 mg | ORAL_TABLET | Freq: Every day | ORAL | 1 refills | Status: DC

## 2018-08-23 DIAGNOSIS — M7581 Other shoulder lesions, right shoulder: Secondary | ICD-10-CM | POA: Diagnosis not present

## 2018-08-23 DIAGNOSIS — M7541 Impingement syndrome of right shoulder: Secondary | ICD-10-CM | POA: Diagnosis not present

## 2018-08-24 DIAGNOSIS — D171 Benign lipomatous neoplasm of skin and subcutaneous tissue of trunk: Secondary | ICD-10-CM | POA: Diagnosis not present

## 2018-08-24 DIAGNOSIS — H8102 Meniere's disease, left ear: Secondary | ICD-10-CM | POA: Diagnosis not present

## 2018-08-24 DIAGNOSIS — H903 Sensorineural hearing loss, bilateral: Secondary | ICD-10-CM | POA: Diagnosis not present

## 2018-08-24 DIAGNOSIS — Z853 Personal history of malignant neoplasm of breast: Secondary | ICD-10-CM | POA: Diagnosis not present

## 2018-08-24 DIAGNOSIS — H9319 Tinnitus, unspecified ear: Secondary | ICD-10-CM | POA: Diagnosis not present

## 2018-08-24 DIAGNOSIS — D216 Benign neoplasm of connective and other soft tissue of trunk, unspecified: Secondary | ICD-10-CM | POA: Diagnosis not present

## 2018-08-27 ENCOUNTER — Telehealth: Payer: Self-pay | Admitting: *Deleted

## 2018-08-27 NOTE — Telephone Encounter (Signed)
Please review and let me know what to send her pharmacy. Thank you.

## 2018-08-27 NOTE — Telephone Encounter (Signed)
Patient called and reports that she cannot tolerate the Tamoxifen, she took it for a month and had so much pain and it made her fell bad so she stopped it a week ago today and she already notices she feels much better. She states she knows she needs to be on something so she wants to know what you want to put her on now.Please advise

## 2018-08-27 NOTE — Telephone Encounter (Signed)
She can have letrozole 2.5 mg daily. She can wait until her symptoms resolve to start.

## 2018-08-28 MED ORDER — LETROZOLE 2.5 MG PO TABS: 2.5000 mg | ORAL_TABLET | Freq: Every day | ORAL | 2 refills | Status: DC

## 2018-08-28 NOTE — Telephone Encounter (Signed)
Patient informed of medicine change and to wait until he symptoms have resolved to start new me. Prescription sent to pharmacy

## 2018-09-13 DIAGNOSIS — Z79899 Other long term (current) drug therapy: Secondary | ICD-10-CM | POA: Diagnosis not present

## 2018-09-13 DIAGNOSIS — M0579 Rheumatoid arthritis with rheumatoid factor of multiple sites without organ or systems involvement: Secondary | ICD-10-CM | POA: Diagnosis not present

## 2018-09-14 NOTE — Progress Notes (Signed)
[place]  Telephone:(336) [phone] Fax:(336) [phone]  ID: Michelle Moore OB: Jan 16, 1956  MR#: [id number]  AOZ#:[id number]  Patient Care Team: Lavera Guise, MD as PCP - General (Internal Medicine)  CHIEF COMPLAINT: DCIS, right breast.  INTERVAL HISTORY: Patient returns to clinic today for routine 51-monthevaluation.  She was initially started on tamoxifen, but discontinued after several days secondary to significant increase in joint pain.  Her symptoms resolved when she discontinued treatment.  Patient was subsequently started on letrozole and is now tolerating this well without significant side effects.  She currently feels well and is asymptomatic.  She has no neurologic complaints.  She denies any recent fevers or illnesses.  She has a good appetite and denies weight loss.  She has no chest pain or shortness of breath.  She denies any nausea, vomiting, constipation, or diarrhea.  She has no urinary complaints.  She has no further joint pain.  Patient offers no specific complaints today.  REVIEW OF SYSTEMS:   Review of Systems  Constitutional: Negative.  Negative for fever, malaise/fatigue and weight loss.  Respiratory: Negative.  Negative for cough, hemoptysis and shortness of breath.   Cardiovascular: Negative.  Negative for chest pain and leg swelling.  Gastrointestinal: Negative.  Negative for abdominal pain.  Genitourinary: Negative.  Negative for dysuria.  Musculoskeletal: Negative.  Negative for back pain.  Skin: Negative.  Negative for rash.  Neurological: Negative.  Negative for sensory change, focal weakness, weakness and headaches.  Psychiatric/Behavioral: Negative.  The patient is not nervous/anxious.     As per HPI. Otherwise, a complete review of systems is negative.  PAST MEDICAL HISTORY: Past Medical History:  Diagnosis Date  . Anxiety   . Arthritis   . Cancer ([place]    breast  . COPD (chronic obstructive pulmonary disease) ([place]   . Dyspnea     ONLY WITH EXERTION  . GERD (gastroesophageal reflux disease)    "sometimes"  . Headache(784.0)    sinus headaches  . Myocardial infarction ([place]    in 2011  no damage  . Seizures ([place] 2014   X1  . Sleep apnea    USES CPAP    PAST SURGICAL HISTORY: Past Surgical History:  Procedure Laterality Date  . ABDOMINAL HYSTERECTOMY    . ANTERIOR CERVICAL DECOMP/DISCECTOMY FUSION N/A 01/20/2014   Procedure: ANTERIOR CERVICAL DECOMPRESSION/DISCECTOMY FUSION 2 LEVELS cervical five/six six /Tarry Kos  Surgeon: JOphelia Charter MD;  [facility];  Service: Neurosurgery;  Laterality: N/A;  . BREAST BIOPSY Right 02/01/2018   x shape, path pending  . BREAST LUMPECTOMY Right 03/07/2018   Procedure: BREAST LUMPECTOMY/ RE EXCISION;  Surgeon: CHerbert Pun MD;  Location: [facility];  Service: General;  Laterality: Right;  . CARDIAC CATHETERIZATION     2011  . CORONARY ANGIOPLASTY WITH STENT PLACEMENT     PLACED IN RCA 2011  . EYE SURGERY     lasik  . PARTIAL MASTECTOMY WITH NEEDLE LOCALIZATION Right 02/21/2018   Procedure: PARTIAL MASTECTOMY WITH NEEDLE LOCALIZATION;  Surgeon: CHerbert Pun MD;  Location: [facility];  Service: General;  Laterality: Right;  . ROTATOR CUFF REPAIR     left shoulder  . TOE SURGERY Right    has pin and plate in it    FAMILY HISTORY: Family History  Problem Relation Age of Onset  . Breast cancer Cousin 533      bilateral at 577and 72 daughter of maternal aunt who was unaffected  . Lung cancer  Maternal Aunt        dx 2s; deceased 80s; smoker  . Heart attack Father        deceased 38  . Stomach cancer Maternal Grandmother     ADVANCED DIRECTIVES (Y/N):  N  HEALTH MAINTENANCE: Social History   Tobacco Use  . Smoking status: Former Smoker    Packs/day: 1.00    Years: 30.00    Pack years: 30.00    Types: Cigarettes    Last attempt to quit: 09/13/2015    Years since quitting: 3.0  . Smokeless tobacco: Never Used  Substance Use  Topics  . Alcohol use: Yes    Comment: occasional  . Drug use: No     Colonoscopy:  PAP:  Bone density:  Lipid panel:  Allergies  Allergen Reactions  . Other     BANDAIDS-OF LEFT ON FOR AN EXTENDED PERIOD OF TIME    Current Outpatient Medications  Medication Sig Dispense Refill  . ALPRAZolam (XANAX) 0.25 MG tablet Take 1 tablet (0.25 mg total) by mouth 2 (two) times daily as needed for anxiety. 180 tablet 1  . aspirin EC 81 MG tablet Take 81 mg by mouth at bedtime.    . Aspirin-Acetaminophen-Caffeine (EXCEDRIN EXTRA STRENGTH PO) Take 2 tablets by mouth daily as needed (for arm and neck pain).    . cetirizine (ZYRTEC) 10 MG tablet Take 10 mg by mouth daily as needed for allergies.     . Chlorphen-Pseudoephed-APAP (CORICIDIN D PO) Take by mouth as needed.    . citalopram (CELEXA) 40 MG tablet Take 1 tablet (40 mg total) by mouth at bedtime. 90 tablet 1  . dextromethorphan (DELSYM) 30 MG/5ML liquid Take 30 mg by mouth as needed for cough.    . fluticasone (FLONASE) 50 MCG/ACT nasal spray Place 2 sprays into both nostrils daily as needed for allergies.     . Fluticasone-Salmeterol (ADVAIR) 250-50 MCG/DOSE AEPB Inhale 1 puff into the lungs 2 (two) times daily. 60 each 5  . folic acid (FOLVITE) 482 MCG tablet Take 1,600 mcg by mouth daily.    . hydrochlorothiazide (HYDRODIURIL) 12.5 MG tablet Take 12.5 mg by mouth daily.    Marland Kitchen letrozole (FEMARA) 2.5 MG tablet Take 1 tablet (2.5 mg total) by mouth daily. 30 tablet 2  . methotrexate (RHEUMATREX) 2.5 MG tablet TAKE 8 TABLETS WEEKLY ON WEDNESDAYS    . montelukast (SINGULAIR) 10 MG tablet Take 1 tablet (10 mg total) by mouth at bedtime. 30 tablet 3  . Omeprazole 20 MG TBEC Take 20 mg by mouth every morning.     Marland Kitchen OVER THE COUNTER MEDICATION Take 1 drop by mouth daily. CBD Oil    . simvastatin (ZOCOR) 40 MG tablet Take 1 tablet (40 mg total) by mouth at bedtime. 90 tablet 1  . traMADol (ULTRAM) 50 MG tablet 2 TAB QD    .  Pseudoephedrine-Ibuprofen (ADVIL COLD/SINUS) 30-200 MG TABS Take 1 tablet by mouth as needed.     No current facility-administered medications for this visit.     OBJECTIVE: Vitals:   09/20/18 0952  BP: 113/77  Pulse: 100  Temp: 98.7 F (37.1 C)     Body mass index is 27.93 kg/m.    ECOG FS:0 - Asymptomatic  General: Well-developed, well-nourished, no acute distress. Eyes: Pink conjunctiva, anicteric sclera. HEENT: Normocephalic, moist mucous membranes. Breast: Patient declined breast exam today. Lungs: Clear to auscultation bilaterally. Heart: Regular rate and rhythm. No rubs, murmurs, or gallops. Abdomen: Soft, nontender, nondistended. No  organomegaly noted, normoactive bowel sounds. Musculoskeletal: No edema, cyanosis, or clubbing. Neuro: Alert, answering all questions appropriately. Cranial nerves grossly intact. Skin: No rashes or petechiae noted. Psych: Normal affect.  LAB RESULTS:  Lab Results  Component Value Date   NA 141 02/14/2018   K 3.6 02/14/2018   CL 104 02/14/2018   CO2 25 02/14/2018   GLUCOSE 92 02/14/2018   BUN 8 02/14/2018   CREATININE 0.59 02/14/2018   CALCIUM 9.4 02/14/2018   PROT 7.9 02/14/2018   ALBUMIN 4.4 02/14/2018   AST 22 02/14/2018   ALT 23 02/14/2018   ALKPHOS 84 02/14/2018   BILITOT 0.4 02/14/2018   GFRNONAA >60 02/14/2018   GFRAA >60 02/14/2018    Lab Results  Component Value Date   WBC 7.6 05/08/2018   NEUTROABS 13.6 (H) 02/14/2018   HGB 15.2 05/08/2018   HCT 45.7 05/08/2018   MCV 95.5 05/08/2018   PLT 199 05/08/2018     STUDIES: No results found.  ASSESSMENT: DCIS, right breast.  (ER positive, PR negative)  PLAN:    1. DCIS, right breast: Patient underwent lumpectomy followed by adjuvant XRT which she completed in approximately September 2019.  Because there was no invasive component on her pathology, she did not require adjuvant chemotherapy.  Patient initially was started on tamoxifen, but could not tolerate this  therefore was switched to letrozole.  Continue treatment for a total of 5 years completing in October 2024.  Patient will require repeat mammogram in May 2020.  Return to clinic in 6 months for routine evaluation.   2.  Bone health: Will get a baseline bone mineral density in the next 1 to 2 weeks.  I spent a total of 30 minutes face-to-face with the patient of which greater than 50% of the visit was spent in counseling and coordination of care as detailed above.    Patient expressed understanding and was in agreement with this plan. She also understands that She can call clinic at any time with any questions, concerns, or complaints.   Cancer Staging Ductal carcinoma in situ (DCIS) of right breast Staging form: Breast, AJCC 8th Edition - Clinical: Stage 0 (cTis (DCIS), cN0, cM0, ER+, PR-, HER2-) - Signed by Lloyd Huger, MD on 03/18/2018   Lloyd Huger, MD   09/21/2018 2:25 PM

## 2018-09-19 ENCOUNTER — Other Ambulatory Visit: Payer: Self-pay

## 2018-09-19 MED ORDER — MONTELUKAST SODIUM 10 MG PO TABS: 10.0000 mg | ORAL_TABLET | Freq: Every day | ORAL | 3 refills | Status: DC

## 2018-09-20 ENCOUNTER — Inpatient Hospital Stay: Payer: Medicare HMO | Attending: Oncology | Admitting: Oncology

## 2018-09-20 ENCOUNTER — Other Ambulatory Visit: Payer: Self-pay

## 2018-09-20 ENCOUNTER — Encounter (INDEPENDENT_AMBULATORY_CARE_PROVIDER_SITE_OTHER): Payer: Self-pay

## 2018-09-20 VITALS — BP 113/77 | HR 100 | Temp 98.7°F | Ht 60.0 in | Wt 143.0 lb

## 2018-09-20 DIAGNOSIS — D0511 Intraductal carcinoma in situ of right breast: Secondary | ICD-10-CM

## 2018-09-20 DIAGNOSIS — Z79899 Other long term (current) drug therapy: Secondary | ICD-10-CM | POA: Diagnosis not present

## 2018-09-20 DIAGNOSIS — Z79811 Long term (current) use of aromatase inhibitors: Secondary | ICD-10-CM | POA: Diagnosis not present

## 2018-09-20 DIAGNOSIS — C50911 Malignant neoplasm of unspecified site of right female breast: Secondary | ICD-10-CM | POA: Diagnosis not present

## 2018-09-20 DIAGNOSIS — Z170 Estrogen receptor positive status [ER+]: Secondary | ICD-10-CM | POA: Diagnosis not present

## 2018-09-20 DIAGNOSIS — M150 Primary generalized (osteo)arthritis: Secondary | ICD-10-CM | POA: Diagnosis not present

## 2018-09-20 DIAGNOSIS — G8929 Other chronic pain: Secondary | ICD-10-CM | POA: Diagnosis not present

## 2018-09-20 DIAGNOSIS — M0579 Rheumatoid arthritis with rheumatoid factor of multiple sites without organ or systems involvement: Secondary | ICD-10-CM | POA: Diagnosis not present

## 2018-09-20 DIAGNOSIS — M25511 Pain in right shoulder: Secondary | ICD-10-CM | POA: Diagnosis not present

## 2018-09-20 NOTE — Progress Notes (Signed)
Patient is here today to follow up on her ductal carcinoma in situ of right breast. Patient stated that she continues to have a "sting" on her right breast but it only happens once in a while. Patient stated that she continues to do her self-breast exams and has no issues on any of her breasts.

## 2018-11-01 ENCOUNTER — Ambulatory Visit
Admission: RE | Admit: 2018-11-01 | Discharge: 2018-11-01 | Disposition: A | Discharge status: Home / Self Care | Payer: Medicare HMO | Source: Ambulatory Visit | Attending: Oncology | Admitting: Oncology

## 2018-11-01 DIAGNOSIS — M8588 Other specified disorders of bone density and structure, other site: Secondary | ICD-10-CM | POA: Insufficient documentation

## 2018-11-01 DIAGNOSIS — M8589 Other specified disorders of bone density and structure, multiple sites: Secondary | ICD-10-CM | POA: Diagnosis not present

## 2018-11-01 DIAGNOSIS — D0511 Intraductal carcinoma in situ of right breast: Secondary | ICD-10-CM | POA: Diagnosis not present

## 2018-11-01 DIAGNOSIS — Z780 Asymptomatic menopausal state: Secondary | ICD-10-CM | POA: Diagnosis not present

## 2018-11-01 DIAGNOSIS — M85851 Other specified disorders of bone density and structure, right thigh: Secondary | ICD-10-CM | POA: Diagnosis not present

## 2018-11-12 ENCOUNTER — Other Ambulatory Visit: Payer: Self-pay | Admitting: Nurse Practitioner

## 2018-11-12 DIAGNOSIS — F411 Generalized anxiety disorder: Secondary | ICD-10-CM

## 2018-11-12 MED ORDER — ALPRAZOLAM 0.25 MG PO TABS: 0.2500 mg | ORAL_TABLET | Freq: Two times a day (BID) | ORAL | 1 refills | Status: DC | PRN

## 2018-11-12 NOTE — Progress Notes (Signed)
Approved 90 day prescription for alprazolam 0.25mg  twice daily if needed for acute anxiety. New rx sent to [facility].

## 2018-11-16 ENCOUNTER — Ambulatory Visit: Payer: Medicare HMO | Admitting: Nurse Practitioner

## 2018-11-16 ENCOUNTER — Encounter: Payer: Self-pay | Admitting: Nurse Practitioner

## 2018-11-16 VITALS — BP 120/64 | HR 82 | Resp 16 | Ht 60.0 in | Wt 141.8 lb

## 2018-11-27 ENCOUNTER — Other Ambulatory Visit: Payer: Self-pay

## 2018-11-27 MED ORDER — SIMVASTATIN 40 MG PO TABS: 40.0000 mg | ORAL_TABLET | Freq: Every day | ORAL | 0 refills | Status: DC

## 2018-11-28 ENCOUNTER — Other Ambulatory Visit: Payer: Self-pay

## 2018-11-28 ENCOUNTER — Encounter: Payer: Self-pay | Admitting: Radiation Oncology

## 2018-11-28 ENCOUNTER — Ambulatory Visit
Admission: RE | Admit: 2018-11-28 | Discharge: 2018-11-28 | Disposition: A | Discharge status: Home / Self Care | Payer: Medicare HMO | Source: Ambulatory Visit | Attending: Radiation Oncology | Admitting: Radiation Oncology

## 2018-11-28 VITALS — BP 134/73 | HR 82 | Temp 97.7°F | Resp 16 | Wt 147.0 lb

## 2018-11-28 DIAGNOSIS — D0511 Intraductal carcinoma in situ of right breast: Secondary | ICD-10-CM | POA: Diagnosis not present

## 2018-11-28 DIAGNOSIS — H8102 Meniere's disease, left ear: Secondary | ICD-10-CM | POA: Diagnosis not present

## 2018-11-28 DIAGNOSIS — Z79811 Long term (current) use of aromatase inhibitors: Secondary | ICD-10-CM | POA: Insufficient documentation

## 2018-11-28 DIAGNOSIS — Z170 Estrogen receptor positive status [ER+]: Secondary | ICD-10-CM | POA: Diagnosis not present

## 2018-11-28 DIAGNOSIS — Z923 Personal history of irradiation: Secondary | ICD-10-CM | POA: Insufficient documentation

## 2018-11-28 DIAGNOSIS — J301 Allergic rhinitis due to pollen: Secondary | ICD-10-CM | POA: Diagnosis not present

## 2018-11-28 DIAGNOSIS — M069 Rheumatoid arthritis, unspecified: Secondary | ICD-10-CM | POA: Insufficient documentation

## 2018-11-28 DIAGNOSIS — H903 Sensorineural hearing loss, bilateral: Secondary | ICD-10-CM | POA: Diagnosis not present

## 2018-11-28 NOTE — Progress Notes (Signed)
Radiation Oncology Follow up Note  Name: Michelle Moore   Date:   11/28/2018 MRN:  [id number] DOB: Mar 10, 1956    This 63 y.o. female presents to the clinic today for 6 month follow-up status post whole breast radiation to her right breast for ER/PR positive ductal carcinoma in situ.  REFERRING PROVIDER: Lavera Guise, MD  HPI: patient is a 63 year old female now out 6 months having completed whole breast radiation to her right breast for ER/PR positive ductal carcinoma in situ. She is seen today in routine follow-up and is doing well. She's been scheduled follow-up mammograms have not had one yet..she's been on Femara although it is on hold based on exacerbation of her rheumatoid arthritis. She specifically denies breast tenderness cough or bone pain.  COMPLICATIONS OF TREATMENT: none  FOLLOW UP COMPLIANCE: keeps appointments   PHYSICAL EXAM:  BP 134/73 (BP Location: Left Arm, Patient Position: Sitting)   Pulse 82   Temp 97.7 F (36.5 C) (Tympanic)   Resp 16   Wt 147 lb 0.8 oz (66.7 kg)   BMI 28.72 kg/m  Lungs are clear to A&P cardiac examination essentially unremarkable with regular rate and rhythm. No dominant mass or nodularity is noted in either breast in 2 positions examined. Incision is well-healed. No axillary or supraclavicular adenopathy is appreciated. Cosmetic result is excellent.Well-developed well-nourished patient in NAD. HEENT reveals PERLA, EOMI, discs not visualized.  Oral cavity is clear. No oral mucosal lesions are identified. Neck is clear without evidence of cervical or supraclavicular adenopathy. Lungs are clear to A&P. Cardiac examination is essentially unremarkable with regular rate and rhythm without murmur rub or thrill. Abdomen is benign with no organomegaly or masses noted. Motor sensory and DTR levels are equal and symmetric in the upper and lower extremities. Cranial nerves II through XII are grossly intact. Proprioception is intact. No peripheral adenopathy or  edema is identified. No motor or sensory levels are noted. Crude visual fields are within normal range.  RADIOLOGY RESULTS: no current films for review  PLAN: resent time patient is doing well with no evidence of disease. She is discussing with medical oncology switching her antiestrogen therapy. I'm otherwise please were overall progress. I have asked to see her back in 6 months for follow-up and then will go to once your follow-up visits. Mammograms again of organ ordered. Patient is to call with any concerns at any time.  I would like to take this opportunity to thank you for allowing me to participate in the care of your patient.Noreene Filbert, MD

## 2018-12-04 ENCOUNTER — Ambulatory Visit: Payer: Medicare HMO | Admitting: Oncology

## 2018-12-04 ENCOUNTER — Other Ambulatory Visit: Payer: Self-pay

## 2018-12-05 ENCOUNTER — Inpatient Hospital Stay: Payer: Medicare HMO | Admitting: Oncology

## 2018-12-05 ENCOUNTER — Inpatient Hospital Stay: Payer: Medicare HMO | Attending: Oncology | Admitting: Oncology

## 2018-12-05 DIAGNOSIS — Z923 Personal history of irradiation: Secondary | ICD-10-CM | POA: Diagnosis not present

## 2018-12-05 DIAGNOSIS — M2550 Pain in unspecified joint: Secondary | ICD-10-CM | POA: Diagnosis not present

## 2018-12-05 DIAGNOSIS — Z09 Encounter for follow-up examination after completed treatment for conditions other than malignant neoplasm: Secondary | ICD-10-CM

## 2018-12-05 DIAGNOSIS — C50919 Malignant neoplasm of unspecified site of unspecified female breast: Secondary | ICD-10-CM | POA: Diagnosis not present

## 2018-12-05 DIAGNOSIS — T451X5A Adverse effect of antineoplastic and immunosuppressive drugs, initial encounter: Secondary | ICD-10-CM

## 2018-12-05 NOTE — Progress Notes (Signed)
Virtual Visit via Telephone Note  I connected with Michelle Moore on 12/05/18 at 10:00 AM EDT by telephone and verified that I am speaking with the correct person using two identifiers.   I discussed the limitations, risks, security and privacy concerns of performing an evaluation and management service by telephone and the availability of in person appointments. I also discussed with the patient that there may be a patient responsible charge related to this service. The patient expressed understanding and agreed to proceed.   History of Present Illness: Patient evaluated over the phone as an add-on after being switched to letrozole and unable to tolerate treatment.  Previously tamoxifen caused joint aches and she was switched to letrozole.  She feels letrozole caused an exacerbation of her rheumatoid arthritis, but since discontinuing treatment 1 week ago her symptoms have improved.  She has no neurologic complaints.  She denies any recent fevers or illnesses.  She has a good appetite and denies weight loss.  She has no chest pain or shortness of breath.  She denies any nausea, vomiting, constipation, or diarrhea.  She has no urinary complaints.  Patient otherwise feels well and offers no further specific complaints.    Observations/Objective:  Patient with significant joint pain secondary to letrozole which has improved over the last week since discontinuing treatment.  She also reported she has reinitiated methotrexate for her rheumatoid arthritis.   Assessment and Plan:   1. DCIS, right breast: Patient underwent lumpectomy followed by adjuvant XRT which she completed in approximately September 2019.  Because there was no invasive component on her pathology, she did not require adjuvant chemotherapy.  Patient cannot tolerate tamoxifen or letrozole.  She has been instructed to continue to hold treatment for an additional 3 weeks to see if her symptoms completely abate.  Patient will then be  switched back to tamoxifen or tried on a trial of anastrozole.  She is due for mammogram in April 2019.    2.  Osteopenia: Patient had a bone mineral density on November 01, 2018 which reported T score of -1.4.  Results were discussed with patient and she has been instructed to initiate calcium and vitamin D.   Follow Up Instructions:    Follow-up phone call in 3 weeks to assess symptoms and initiate either tamoxifen or anastrozole. Bilateral diagnostic mammogram in April 2020. Initiate calcium and vitamin D supplementation Return to clinic in 6 months for routine evaluation.    I discussed the assessment and treatment plan with the patient. The patient was provided an opportunity to ask questions and all were answered. The patient agreed with the plan and demonstrated an understanding of the instructions.   The patient was advised to call back or seek an in-person evaluation if the symptoms worsen or if the condition fails to improve as anticipated.  I provided 15 minutes of non-face-to-face time during this encounter.   Lloyd Huger, MD

## 2018-12-05 NOTE — Progress Notes (Signed)
Follow up visit via virtual visit by telephone/verified by name and DOB.  Patient has stopped with Letrozole x1 week due to diffuse aching with the worse being in her LE.  Since she has stopped with Letrozole the LE pain has improved.

## 2018-12-11 DIAGNOSIS — M9902 Segmental and somatic dysfunction of thoracic region: Secondary | ICD-10-CM | POA: Diagnosis not present

## 2018-12-11 DIAGNOSIS — M546 Pain in thoracic spine: Secondary | ICD-10-CM | POA: Diagnosis not present

## 2018-12-11 DIAGNOSIS — M5442 Lumbago with sciatica, left side: Secondary | ICD-10-CM | POA: Diagnosis not present

## 2018-12-11 DIAGNOSIS — M9905 Segmental and somatic dysfunction of pelvic region: Secondary | ICD-10-CM | POA: Diagnosis not present

## 2018-12-11 DIAGNOSIS — M5136 Other intervertebral disc degeneration, lumbar region: Secondary | ICD-10-CM | POA: Diagnosis not present

## 2018-12-11 DIAGNOSIS — M9903 Segmental and somatic dysfunction of lumbar region: Secondary | ICD-10-CM | POA: Diagnosis not present

## 2018-12-12 DIAGNOSIS — M5136 Other intervertebral disc degeneration, lumbar region: Secondary | ICD-10-CM | POA: Diagnosis not present

## 2018-12-12 DIAGNOSIS — M546 Pain in thoracic spine: Secondary | ICD-10-CM | POA: Diagnosis not present

## 2018-12-12 DIAGNOSIS — M9902 Segmental and somatic dysfunction of thoracic region: Secondary | ICD-10-CM | POA: Diagnosis not present

## 2018-12-12 DIAGNOSIS — M5442 Lumbago with sciatica, left side: Secondary | ICD-10-CM | POA: Diagnosis not present

## 2018-12-12 DIAGNOSIS — M9905 Segmental and somatic dysfunction of pelvic region: Secondary | ICD-10-CM | POA: Diagnosis not present

## 2018-12-12 DIAGNOSIS — M9903 Segmental and somatic dysfunction of lumbar region: Secondary | ICD-10-CM | POA: Diagnosis not present

## 2018-12-14 DIAGNOSIS — M9905 Segmental and somatic dysfunction of pelvic region: Secondary | ICD-10-CM | POA: Diagnosis not present

## 2018-12-14 DIAGNOSIS — M546 Pain in thoracic spine: Secondary | ICD-10-CM | POA: Diagnosis not present

## 2018-12-14 DIAGNOSIS — M955 Acquired deformity of pelvis: Secondary | ICD-10-CM | POA: Diagnosis not present

## 2018-12-14 DIAGNOSIS — M9902 Segmental and somatic dysfunction of thoracic region: Secondary | ICD-10-CM | POA: Diagnosis not present

## 2018-12-14 DIAGNOSIS — M9903 Segmental and somatic dysfunction of lumbar region: Secondary | ICD-10-CM | POA: Diagnosis not present

## 2018-12-14 DIAGNOSIS — M5442 Lumbago with sciatica, left side: Secondary | ICD-10-CM | POA: Diagnosis not present

## 2018-12-17 DIAGNOSIS — M5442 Lumbago with sciatica, left side: Secondary | ICD-10-CM | POA: Diagnosis not present

## 2018-12-17 DIAGNOSIS — M9902 Segmental and somatic dysfunction of thoracic region: Secondary | ICD-10-CM | POA: Diagnosis not present

## 2018-12-17 DIAGNOSIS — M955 Acquired deformity of pelvis: Secondary | ICD-10-CM | POA: Diagnosis not present

## 2018-12-17 DIAGNOSIS — M9905 Segmental and somatic dysfunction of pelvic region: Secondary | ICD-10-CM | POA: Diagnosis not present

## 2018-12-17 DIAGNOSIS — M546 Pain in thoracic spine: Secondary | ICD-10-CM | POA: Diagnosis not present

## 2018-12-17 DIAGNOSIS — M9903 Segmental and somatic dysfunction of lumbar region: Secondary | ICD-10-CM | POA: Diagnosis not present

## 2018-12-19 ENCOUNTER — Other Ambulatory Visit: Payer: Self-pay | Admitting: Nurse Practitioner

## 2018-12-19 MED ORDER — MONTELUKAST SODIUM 10 MG PO TABS: 10.0000 mg | ORAL_TABLET | Freq: Every day | ORAL | 11 refills | Status: DC

## 2018-12-20 DIAGNOSIS — M955 Acquired deformity of pelvis: Secondary | ICD-10-CM | POA: Diagnosis not present

## 2018-12-20 DIAGNOSIS — M5442 Lumbago with sciatica, left side: Secondary | ICD-10-CM | POA: Diagnosis not present

## 2018-12-20 DIAGNOSIS — M546 Pain in thoracic spine: Secondary | ICD-10-CM | POA: Diagnosis not present

## 2018-12-20 DIAGNOSIS — M9902 Segmental and somatic dysfunction of thoracic region: Secondary | ICD-10-CM | POA: Diagnosis not present

## 2018-12-20 DIAGNOSIS — M9905 Segmental and somatic dysfunction of pelvic region: Secondary | ICD-10-CM | POA: Diagnosis not present

## 2018-12-20 DIAGNOSIS — M9903 Segmental and somatic dysfunction of lumbar region: Secondary | ICD-10-CM | POA: Diagnosis not present

## 2018-12-25 ENCOUNTER — Telehealth: Payer: Self-pay | Admitting: Oncology

## 2018-12-26 ENCOUNTER — Other Ambulatory Visit: Payer: Self-pay

## 2018-12-26 ENCOUNTER — Inpatient Hospital Stay: Payer: Medicare HMO | Attending: Oncology | Admitting: Oncology

## 2018-12-26 DIAGNOSIS — Z923 Personal history of irradiation: Secondary | ICD-10-CM | POA: Diagnosis not present

## 2018-12-26 DIAGNOSIS — M810 Age-related osteoporosis without current pathological fracture: Secondary | ICD-10-CM

## 2018-12-26 DIAGNOSIS — Z79811 Long term (current) use of aromatase inhibitors: Secondary | ICD-10-CM

## 2018-12-26 DIAGNOSIS — D0511 Intraductal carcinoma in situ of right breast: Secondary | ICD-10-CM

## 2018-12-26 MED ORDER — ANASTROZOLE 1 MG PO TABS: 1.0000 mg | ORAL_TABLET | Freq: Every day | ORAL | 5 refills | Status: DC

## 2018-12-26 NOTE — Progress Notes (Signed)
Michelle Moore  [phone] [phone]  HEMATOLOGY-ONCOLOGY TELEMEDICINE VISIT PROGRESS NOTE  ID: DOSHA BROSHEARS OB: 1955/09/17  MR#: [id number]  YBO#:[id number]  Patient Care Team: Lavera Guise, MD as PCP - General (Internal Medicine)  I connected with Lanna Poche on 12/26/18 at 10:00 AM EDT by telephone visit and verified that I am speaking with the correct person using two identifiers.   I discussed the limitations, risks, security and privacy concerns of performing an evaluation and management service by telemedicine and the availability of in-person appointments. I also discussed with the patient that there may be a patient responsible charge related to this service. The patient expressed understanding and agreed to proceed.  Other persons participating in the visit and their role in the encounter:  Patient, nursing  PATIENT'S LOCATION:  Home PROVIDER'S LOCATION:  Clinic  CHIEF COMPLAINT: DCIS, right breast.  INTERVAL HISTORY: Patient once again agreed to evaluation of the telephone after discontinuing letrozole for approximately 3 weeks.  Patient states her joint pain and arthritis have significantly improved and she feels back to her baseline.  She has no neurologic complaints.  She denies any recent fevers or illnesses.  She has a good appetite and denies weight loss.  She denies any chest pain, shortness of breath, cough, or hemoptysis.  She denies any nausea, vomiting, constipation, or diarrhea.  She has no urinary complaints.  Patient offers no specific complaints today.  REVIEW OF SYSTEMS:   Review of Systems  Constitutional: Negative.  Negative for fever, malaise/fatigue and weight loss.  Respiratory: Negative.  Negative for cough, hemoptysis and shortness of breath.   Cardiovascular: Negative.  Negative for chest pain and leg swelling.  Gastrointestinal: Negative.  Negative for abdominal pain.  Genitourinary: Negative.  Negative for  dysuria.  Musculoskeletal: Negative.  Negative for back pain and myalgias.  Skin: Negative.  Negative for rash.  Neurological: Negative.  Negative for dizziness, sensory change, focal weakness and weakness.  Psychiatric/Behavioral: Negative.  The patient is not nervous/anxious.     As per HPI. Otherwise, a complete review of systems is negative.  PAST MEDICAL HISTORY: Past Medical History:  Diagnosis Date  . Anxiety   . Arthritis   . Cancer ([place])    breast  . COPD (chronic obstructive pulmonary disease) ([place])   . Dyspnea    ONLY WITH EXERTION  . GERD (gastroesophageal reflux disease)    "sometimes"  . Headache(784.0)    sinus headaches  . Myocardial infarction ([place])    in 2011  no damage  . Seizures ([place]) 2014   X1  . Sleep apnea    USES CPAP    PAST SURGICAL HISTORY: Past Surgical History:  Procedure Laterality Date  . ABDOMINAL HYSTERECTOMY    . ANTERIOR CERVICAL DECOMP/DISCECTOMY FUSION N/A 01/20/2014   Procedure: ANTERIOR CERVICAL DECOMPRESSION/DISCECTOMY FUSION 2 LEVELS cervical five/six six Tarry Kos;  Surgeon: Ophelia Charter, MD;  Location: [facility];  Service: Neurosurgery;  Laterality: N/A;  . BREAST BIOPSY Right 02/01/2018   x shape, path pending  . BREAST LUMPECTOMY Right 03/07/2018   Procedure: BREAST LUMPECTOMY/ RE EXCISION;  Surgeon: Herbert Pun, MD;  Location: [facility];  Service: General;  Laterality: Right;  . CARDIAC CATHETERIZATION     2011  . CORONARY ANGIOPLASTY WITH STENT PLACEMENT     PLACED IN RCA 2011  . EYE SURGERY     lasik  . PARTIAL MASTECTOMY WITH NEEDLE LOCALIZATION Right 02/21/2018   Procedure: PARTIAL MASTECTOMY WITH NEEDLE LOCALIZATION;  Surgeon: Herbert Pun, MD;  Location: [facility] ORS;  Service: General;  Laterality: Right;  . ROTATOR CUFF REPAIR     left shoulder  . TOE SURGERY Right    has pin and plate in it    FAMILY HISTORY: Family History  Problem Relation Age of Onset  . Breast cancer Cousin 59        bilateral at 61 and 28; daughter of maternal aunt who was unaffected  . Lung cancer Maternal Aunt        dx 38s; deceased 45s; smoker  . Heart attack Father        deceased 31  . Stomach cancer Maternal Grandmother     ADVANCED DIRECTIVES (Y/N):  N  HEALTH MAINTENANCE: Social History   Tobacco Use  . Smoking status: Former Smoker    Packs/day: 1.00    Years: 30.00    Pack years: 30.00    Types: Cigarettes    Last attempt to quit: 09/13/2015    Years since quitting: 3.2  . Smokeless tobacco: Never Used  Substance Use Topics  . Alcohol use: Yes    Comment: occasional  . Drug use: No     Colonoscopy:  PAP:  Bone density:  Lipid panel:  Allergies  Allergen Reactions  . Other     BANDAIDS-OF LEFT ON FOR AN EXTENDED PERIOD OF TIME    Current Outpatient Medications  Medication Sig Dispense Refill  . ALPRAZolam (XANAX) 0.25 MG tablet Take 1 tablet (0.25 mg total) by mouth 2 (two) times daily as needed for anxiety. 180 tablet 1  . aspirin EC 81 MG tablet Take 81 mg by mouth at bedtime.    . Aspirin-Acetaminophen-Caffeine (EXCEDRIN EXTRA STRENGTH PO) Take 2 tablets by mouth daily as needed (for arm and neck pain).    . cetirizine (ZYRTEC) 10 MG tablet Take 10 mg by mouth daily as needed for allergies.     . Chlorphen-Pseudoephed-APAP (CORICIDIN D PO) Take by mouth as needed.    . citalopram (CELEXA) 40 MG tablet Take 1 tablet (40 mg total) by mouth at bedtime. 90 tablet 1  . Cobalamin Combinations (B12 FOLATE PO) Take by mouth.    . fluticasone (FLONASE) 50 MCG/ACT nasal spray Place 2 sprays into both nostrils daily as needed for allergies.     . Fluticasone-Salmeterol (ADVAIR) 250-50 MCG/DOSE AEPB Inhale 1 puff into the lungs 2 (two) times daily. 60 each 5  . folic acid (FOLVITE) 729 MCG tablet Take 1,600 mcg by mouth daily.    . hydrochlorothiazide (HYDRODIURIL) 12.5 MG tablet Take 12.5 mg by mouth daily.    . methotrexate (RHEUMATREX) 2.5 MG tablet TAKE 8 TABLETS WEEKLY ON  WEDNESDAYS    . montelukast (SINGULAIR) 10 MG tablet Take 1 tablet (10 mg total) by mouth at bedtime. 30 tablet 11  . Omeprazole 20 MG TBEC Take 20 mg by mouth every morning.     Marland Kitchen OVER THE COUNTER MEDICATION Take 1 drop by mouth daily. CBD Oil    . Pseudoephedrine-Ibuprofen (ADVIL COLD/SINUS) 30-200 MG TABS Take 1 tablet by mouth as needed.    . simvastatin (ZOCOR) 40 MG tablet Take 1 tablet (40 mg total) by mouth at bedtime. 90 tablet 0  . traMADol (ULTRAM) 50 MG tablet 2 TAB QD    . anastrozole (ARIMIDEX) 1 MG tablet Take 1 tablet (1 mg total) by mouth daily. 30 tablet 5   No current facility-administered medications for this visit.     OBJECTIVE: There were no  vitals filed for this visit.   There is no height or weight on file to calculate BMI.    ECOG FS:0 - Asymptomatic   LAB RESULTS:  Lab Results  Component Value Date   NA 141 02/14/2018   K 3.6 02/14/2018   CL 104 02/14/2018   CO2 25 02/14/2018   GLUCOSE 92 02/14/2018   BUN 8 02/14/2018   CREATININE 0.59 02/14/2018   CALCIUM 9.4 02/14/2018   PROT 7.9 02/14/2018   ALBUMIN 4.4 02/14/2018   AST 22 02/14/2018   ALT 23 02/14/2018   ALKPHOS 84 02/14/2018   BILITOT 0.4 02/14/2018   GFRNONAA >60 02/14/2018   GFRAA >60 02/14/2018    Lab Results  Component Value Date   WBC 7.6 05/08/2018   NEUTROABS 13.6 (H) 02/14/2018   HGB 15.2 05/08/2018   HCT 45.7 05/08/2018   MCV 95.5 05/08/2018   PLT 199 05/08/2018     STUDIES: No results found.  ASSESSMENT: DCIS, right breast.  PLAN:    1. DCIS, right breast:Patient underwent lumpectomy followed by adjuvant XRT which she completed in approximately September 2019. Because there was no invasive component on her pathology, she did not require adjuvant chemotherapy.    Patient could not tolerate tamoxifen or letrozole secondary to worsening joint pain.  Patient agreed to give one last attempt of anastrozole.  She will have another tele-visit in 4 weeks to assess her  toleration of treatment.  If anastrozole causes side effects, will likely discontinue treatment altogether.  Her next mammogram is scheduled for January 02, 2019. 2.  Osteopenia: Patient had a bone mineral density on November 01, 2018 which reported T score of -1.4.  Continue calcium and vitamin D supplementation.   I discussed the assessment and treatment plan with the patient. The patient was provided an opportunity to ask questions and all were answered. The patient agreed with the plan and demonstrated an understanding of the instructions.   The patient was advised to call back or seek an in-person evaluation if the symptoms worsen or if the condition fails to improve as anticipated.  I provided 15 minutes of non face-to-face telephone visit time during this encounter, and > 50% was spent counseling as documented under my assessment & plan.  Lloyd Huger, MD 12/26/2018 4:36 PM  Cancer Staging Ductal carcinoma in situ (DCIS) of right breast Staging form: Breast, AJCC 8th Edition - Clinical: Stage 0 (cTis (DCIS), cN0, cM0, ER+, PR-, HER2-) - Signed by Lloyd Huger, MD on 03/18/2018

## 2018-12-26 NOTE — Progress Notes (Signed)
Telephone visit with patient today for follow up on side effects from letrozole. Patient reports symptoms have resolved since being off letrozole at this time.

## 2018-12-27 ENCOUNTER — Ambulatory Visit: Payer: Medicare HMO | Admitting: Nurse Practitioner

## 2018-12-27 ENCOUNTER — Ambulatory Visit (INDEPENDENT_AMBULATORY_CARE_PROVIDER_SITE_OTHER): Payer: Medicare HMO | Admitting: Nurse Practitioner

## 2018-12-27 ENCOUNTER — Encounter: Payer: Self-pay | Admitting: Nurse Practitioner

## 2018-12-27 VITALS — Ht 65.0 in | Wt 151.0 lb

## 2018-12-27 DIAGNOSIS — E782 Mixed hyperlipidemia: Secondary | ICD-10-CM | POA: Diagnosis not present

## 2018-12-27 DIAGNOSIS — J3089 Other allergic rhinitis: Secondary | ICD-10-CM | POA: Diagnosis not present

## 2018-12-27 DIAGNOSIS — F411 Generalized anxiety disorder: Secondary | ICD-10-CM | POA: Diagnosis not present

## 2018-12-27 DIAGNOSIS — C50911 Malignant neoplasm of unspecified site of right female breast: Secondary | ICD-10-CM

## 2018-12-27 MED ORDER — MONTELUKAST SODIUM 10 MG PO TABS: 10.0000 mg | ORAL_TABLET | Freq: Every day | ORAL | 3 refills | Status: DC

## 2018-12-27 MED ORDER — CITALOPRAM HYDROBROMIDE 40 MG PO TABS: 40.0000 mg | ORAL_TABLET | Freq: Every day | ORAL | 3 refills | Status: DC

## 2018-12-27 NOTE — Progress Notes (Signed)
[facility] [place], [place] [address]  Internal MEDICINE  Telephone Visit  Patient Name: Michelle Moore  [id number]  [id number]  Date of Service: 12/27/2018  I connected with the patient at 9:36am by telephone and verified the patients identity using two identifiers.   I discussed the limitations, risks, security and privacy concerns of performing an evaluation and management service by telephone and the availability of in person appointments. I also discussed with the patient that there may be a patient responsible charge related to the service.  The patient expressed understanding and agrees to proceed.    Chief Complaint  Patient presents with  . Telephone Assessment  . Telephone Screen  . Anxiety  . Quality Metric Gaps    COLONOSCOPY,PAP     The patient has been contacted via telephone for follow up visit due to concerns for spread of novel coronavirus. Recently treated for sciatic nerve issues. Painful in left hip and radiates down the left leg and into the left foot. She has been through two rounds of prednisone and several visits to the chiropractor. She is gradually improving. Afraid to push too hard. Is starting on new cancer drug, similar to tamoxifen, as she has had bad reactions to tamoxifen and letrozole. She states that she, otherwise, is doing well and has no concerns or complaints. She needs to have refills for her citalopram and her singulair today.        Current Medication: Outpatient Encounter Medications as of 12/27/2018  Medication Sig  . ALPRAZolam (XANAX) 0.25 MG tablet Take 1 tablet (0.25 mg total) by mouth 2 (two) times daily as needed for anxiety.  Marland Kitchen anastrozole (ARIMIDEX) 1 MG tablet Take 1 tablet (1 mg total) by mouth daily.  Marland Kitchen aspirin EC 81 MG tablet Take 81 mg by mouth at bedtime.  . Aspirin-Acetaminophen-Caffeine (EXCEDRIN EXTRA STRENGTH PO) Take 2 tablets by mouth daily as needed (for arm and neck pain).  . cetirizine  (ZYRTEC) 10 MG tablet Take 10 mg by mouth daily as needed for allergies.   . Chlorphen-Pseudoephed-APAP (CORICIDIN D PO) Take by mouth as needed.  . citalopram (CELEXA) 40 MG tablet Take 1 tablet (40 mg total) by mouth at bedtime.  . Cobalamin Combinations (B12 FOLATE PO) Take by mouth.  . fluticasone (FLONASE) 50 MCG/ACT nasal spray Place 2 sprays into both nostrils daily as needed for allergies.   . Fluticasone-Salmeterol (ADVAIR) 250-50 MCG/DOSE AEPB Inhale 1 puff into the lungs 2 (two) times daily.  . folic acid (FOLVITE) 696 MCG tablet Take 1,600 mcg by mouth daily.  . hydrochlorothiazide (HYDRODIURIL) 12.5 MG tablet Take 12.5 mg by mouth daily.  . methotrexate (RHEUMATREX) 2.5 MG tablet TAKE 8 TABLETS WEEKLY ON WEDNESDAYS  . montelukast (SINGULAIR) 10 MG tablet Take 1 tablet (10 mg total) by mouth at bedtime.  . Omeprazole 20 MG TBEC Take 20 mg by mouth every morning.   . Pseudoephedrine-Ibuprofen (ADVIL COLD/SINUS) 30-200 MG TABS Take 1 tablet by mouth as needed.  . simvastatin (ZOCOR) 40 MG tablet Take 1 tablet (40 mg total) by mouth at bedtime.  . traMADol (ULTRAM) 50 MG tablet 2 TAB QD  . [DISCONTINUED] citalopram (CELEXA) 40 MG tablet Take 1 tablet (40 mg total) by mouth at bedtime.  . [DISCONTINUED] montelukast (SINGULAIR) 10 MG tablet Take 1 tablet (10 mg total) by mouth at bedtime.  Marland Kitchen OVER THE COUNTER MEDICATION Take 1 drop by mouth daily. CBD Oil   No facility-administered encounter medications on file  as of 12/27/2018.     Surgical History: Past Surgical History:  Procedure Laterality Date  . ABDOMINAL HYSTERECTOMY    . ANTERIOR CERVICAL DECOMP/DISCECTOMY FUSION N/A 01/20/2014   Procedure: ANTERIOR CERVICAL DECOMPRESSION/DISCECTOMY FUSION 2 LEVELS cervical five/six six Tarry Kos;  Surgeon: Ophelia Charter, MD;  Location: [facility];  Service: Neurosurgery;  Laterality: N/A;  . BREAST BIOPSY Right 02/01/2018   x shape, path pending  . BREAST LUMPECTOMY Right 03/07/2018    Procedure: BREAST LUMPECTOMY/ RE EXCISION;  Surgeon: Herbert Pun, MD;  Location: [facility] OR[facility];  Service: General;  Laterality: Right;  . CARDIAC CATHETERIZATION     2011  . CORONARY ANGIOPLASTY WITH STENT PLACEMENT     PLACED IN RCA 2011  . EYE SURGERY     lasik  . PARTIAL MASTECTOMY WITH NEEDLE LOCALIZATION Right 02/21/2018   Procedure: PARTIAL MASTECTOMY WITH NEEDLE LOCALIZATION;  Surgeon: Herbert Pun, MD;  Location: [facility]S;  Service: General;  Laterality: Right;  . ROTATOR CUFF REPAIR     left shoulder  . TOE SURGERY Right    has pin and plate in it    Medical History: Past Medical History:  Diagnosis Date  . Anxiety   . Arthritis   . Cancer ([place])    breast  . COPD (chronic obstructive pulmonary disease) ([place])   . Dyspnea    ONLY WITH EXERTION  . GERD (gastroesophageal reflux disease)    "sometimes"  . Headache(784.0)    sinus headaches  . Myocardial infarction ([place])    in 2011  no damage  . Seizures ([place]) 2014   X1  . Sleep apnea    USES CPAP    Family History: Family History  Problem Relation Age of Onset  . Breast cancer Cousin 82       bilateral at 43 and 29; daughter of maternal aunt who was unaffected  . Lung cancer Maternal Aunt        dx 57s; deceased 44s; smoker  . Heart attack Father        deceased 53  . Stomach cancer Maternal Grandmother     Social History   Socioeconomic History  . Marital status: Married    Spouse name: Not on file  . Number of children: Not on file  . Years of education: Not on file  . Highest education level: Not on file  Occupational History  . Not on file  Social Needs  . Financial resource strain: Not on file  . Food insecurity:    Worry: Not on file    Inability: Not on file  . Transportation needs:    Medical: Not on file    Non-medical: Not on file  Tobacco Use  . Smoking status: Former Smoker    Packs/day: 1.00    Years: 30.00    Pack years: 30.00    Types: Cigarettes    Last attempt  to quit: 09/13/2015    Years since quitting: 3.2  . Smokeless tobacco: Never Used  Substance and Sexual Activity  . Alcohol use: Yes    Comment: occasional  . Drug use: No  . Sexual activity: Not on file  Lifestyle  . Physical activity:    Days per week: Not on file    Minutes per session: Not on file  . Stress: Not on file  Relationships  . Social connections:    Talks on phone: Not on file    Gets together: Not on file    Attends religious service: Not  on file    Active member of club or organization: Not on file    Attends meetings of clubs or organizations: Not on file    Relationship status: Not on file  . Intimate partner violence:    Fear of current or ex partner: Not on file    Emotionally abused: Not on file    Physically abused: Not on file    Forced sexual activity: Not on file  Other Topics Concern  . Not on file  Social History Narrative  . Not on file      Review of Systems  Constitutional: Negative for chills, fatigue and unexpected weight change.  HENT: Negative for congestion, postnasal drip, rhinorrhea, sneezing and sore throat.   Respiratory: Negative for cough, chest tightness and shortness of breath.   Cardiovascular: Negative for chest pain and palpitations.  Gastrointestinal: Negative for abdominal pain, constipation, diarrhea, nausea and vomiting.  Endocrine: Negative for cold intolerance, heat intolerance, polydipsia and polyuria.  Musculoskeletal: Positive for back pain. Negative for arthralgias, joint swelling and neck pain.       Left sided sciatica which is improving.   Skin: Negative for rash.  Allergic/Immunologic: Positive for environmental allergies.  Neurological: Negative for dizziness, tremors, numbness and headaches.  Hematological: Negative for adenopathy. Does not bruise/bleed easily.  Psychiatric/Behavioral: Negative for behavioral problems (Depression), sleep disturbance and suicidal ideas. The patient is nervous/anxious.      Today's Vitals   12/27/18 0929  Weight: 151 lb (68.5 kg)  Height: 5\' 5"  (1.651 m)   Body mass index is 25.13 kg/m.  Observation/Objective:  The patient is alert and oriented. She is pleasant and answering all questions appropriately. She is in no acute distress.    Assessment/Plan: 1. Perennial allergic rhinitis Doing well. Continue allergy medication as prescribed. Refilled singulair today.  - montelukast (SINGULAIR) 10 MG tablet; Take 1 tablet (10 mg total) by mouth at bedtime.  Dispense: 90 tablet; Refill: 3  2. Generalized anxiety disorder Continue citalopram 40mg  daily. Refills sent to her pharmacy today. May take alprazolam 0.25mg  twice daily as needed.  - citalopram (CELEXA) 40 MG tablet; Take 1 tablet (40 mg total) by mouth at bedtime.  Dispense: 90 tablet; Refill: 3  3. Mixed hyperlipidemia Continue simvastatin as prescribed   4. Malignant neoplasm of right female breast, unspecified estrogen receptor status, unspecified site of breast ([place]) Continue medications as prescribed and follow up with oncologist as scheduled.   General Counseling: Michelle Moore understanding of the findings of today's phone visit and agrees with plan of treatment. I have discussed any further diagnostic evaluation that may be needed or ordered today. We also reviewed her medications today. she has been encouraged to call the office with any questions or concerns that should arise related to todays visit.  This patient was seen by Jim Wells with Dr Lavera Guise as a part of collaborative care agreement  Meds ordered this encounter  Medications  . citalopram (CELEXA) 40 MG tablet    Sig: Take 1 tablet (40 mg total) by mouth at bedtime.    Dispense:  90 tablet    Refill:  3    Order Specific Question:   Supervising Provider    Answer:   Lavera Guise [[id number]]  . montelukast (SINGULAIR) 10 MG tablet    Sig: Take 1 tablet (10 mg total) by mouth at bedtime.     Dispense:  90 tablet    Refill:  3    Order Specific  Question:   Supervising Provider    Answer:   Lavera Guise [[id number]]    Time spent: 21 Minutes    Dr Lavera Guise Internal medicine

## 2018-12-31 DIAGNOSIS — M546 Pain in thoracic spine: Secondary | ICD-10-CM | POA: Diagnosis not present

## 2018-12-31 DIAGNOSIS — M5442 Lumbago with sciatica, left side: Secondary | ICD-10-CM | POA: Diagnosis not present

## 2018-12-31 DIAGNOSIS — M5136 Other intervertebral disc degeneration, lumbar region: Secondary | ICD-10-CM | POA: Diagnosis not present

## 2018-12-31 DIAGNOSIS — M955 Acquired deformity of pelvis: Secondary | ICD-10-CM | POA: Diagnosis not present

## 2018-12-31 DIAGNOSIS — M9905 Segmental and somatic dysfunction of pelvic region: Secondary | ICD-10-CM | POA: Diagnosis not present

## 2018-12-31 DIAGNOSIS — M9903 Segmental and somatic dysfunction of lumbar region: Secondary | ICD-10-CM | POA: Diagnosis not present

## 2018-12-31 DIAGNOSIS — M9902 Segmental and somatic dysfunction of thoracic region: Secondary | ICD-10-CM | POA: Diagnosis not present

## 2019-01-02 ENCOUNTER — Ambulatory Visit
Admission: RE | Admit: 2019-01-02 | Discharge: 2019-01-02 | Disposition: A | Discharge status: Home / Self Care | Payer: Medicare HMO | Source: Ambulatory Visit | Attending: Oncology | Admitting: Oncology

## 2019-01-02 ENCOUNTER — Other Ambulatory Visit: Payer: Self-pay

## 2019-01-02 DIAGNOSIS — R921 Mammographic calcification found on diagnostic imaging of breast: Secondary | ICD-10-CM | POA: Diagnosis not present

## 2019-01-02 DIAGNOSIS — D0511 Intraductal carcinoma in situ of right breast: Secondary | ICD-10-CM | POA: Diagnosis not present

## 2019-01-02 HISTORY — DX: Personal history of irradiation: Z92.3

## 2019-01-03 ENCOUNTER — Other Ambulatory Visit: Payer: Medicare HMO

## 2019-01-09 ENCOUNTER — Other Ambulatory Visit: Payer: Self-pay | Admitting: Oncology

## 2019-01-09 DIAGNOSIS — R928 Other abnormal and inconclusive findings on diagnostic imaging of breast: Secondary | ICD-10-CM

## 2019-01-10 DIAGNOSIS — Z853 Personal history of malignant neoplasm of breast: Secondary | ICD-10-CM | POA: Diagnosis not present

## 2019-01-10 DIAGNOSIS — R921 Mammographic calcification found on diagnostic imaging of breast: Secondary | ICD-10-CM | POA: Diagnosis not present

## 2019-01-16 ENCOUNTER — Ambulatory Visit
Admission: RE | Admit: 2019-01-16 | Discharge: 2019-01-16 | Disposition: A | Discharge status: Home / Self Care | Payer: Medicare HMO | Source: Ambulatory Visit | Attending: Oncology | Admitting: Oncology

## 2019-01-16 ENCOUNTER — Other Ambulatory Visit: Payer: Self-pay

## 2019-01-16 DIAGNOSIS — R928 Other abnormal and inconclusive findings on diagnostic imaging of breast: Secondary | ICD-10-CM

## 2019-01-16 DIAGNOSIS — M7581 Other shoulder lesions, right shoulder: Secondary | ICD-10-CM | POA: Diagnosis not present

## 2019-01-16 DIAGNOSIS — J449 Chronic obstructive pulmonary disease, unspecified: Secondary | ICD-10-CM | POA: Diagnosis not present

## 2019-01-16 DIAGNOSIS — N6091 Unspecified benign mammary dysplasia of right breast: Secondary | ICD-10-CM | POA: Diagnosis not present

## 2019-01-16 DIAGNOSIS — R921 Mammographic calcification found on diagnostic imaging of breast: Secondary | ICD-10-CM | POA: Diagnosis not present

## 2019-01-16 DIAGNOSIS — M7541 Impingement syndrome of right shoulder: Secondary | ICD-10-CM | POA: Diagnosis not present

## 2019-01-16 HISTORY — PX: BREAST BIOPSY: SHX20

## 2019-01-17 LAB — SURGICAL PATHOLOGY

## 2019-01-22 ENCOUNTER — Other Ambulatory Visit: Payer: Self-pay

## 2019-01-22 DIAGNOSIS — M545 Low back pain: Secondary | ICD-10-CM | POA: Diagnosis not present

## 2019-01-22 DIAGNOSIS — M5416 Radiculopathy, lumbar region: Secondary | ICD-10-CM | POA: Diagnosis not present

## 2019-01-23 ENCOUNTER — Inpatient Hospital Stay: Payer: Medicare HMO | Attending: Oncology | Admitting: Oncology

## 2019-01-23 ENCOUNTER — Encounter: Payer: Self-pay | Admitting: Oncology

## 2019-01-23 ENCOUNTER — Other Ambulatory Visit: Payer: Self-pay

## 2019-01-23 DIAGNOSIS — Z170 Estrogen receptor positive status [ER+]: Secondary | ICD-10-CM | POA: Diagnosis not present

## 2019-01-23 DIAGNOSIS — C50911 Malignant neoplasm of unspecified site of right female breast: Secondary | ICD-10-CM

## 2019-01-23 DIAGNOSIS — Z79811 Long term (current) use of aromatase inhibitors: Secondary | ICD-10-CM | POA: Diagnosis not present

## 2019-01-23 DIAGNOSIS — Z79899 Other long term (current) drug therapy: Secondary | ICD-10-CM | POA: Diagnosis not present

## 2019-01-23 NOTE — Progress Notes (Signed)
Patient stated that she had been doing well with no complaints. Patient does her monthly self-breast exams.

## 2019-01-23 NOTE — Progress Notes (Signed)
[place]  [phone] [phone]  HEMATOLOGY-ONCOLOGY TELEMEDICINE VISIT PROGRESS NOTE  ID: Michelle Moore OB: 1955-11-26  MR#: [id number]  DPO#:[id number]  Patient Care Team: Lavera Guise, MD as PCP - General (Internal Medicine)  I connected with [name] on 01/23/19 at 10:15 AM EDT by telephone visit and verified that I am speaking with the correct person using two identifiers.   I discussed the limitations, risks, security and privacy concerns of performing an evaluation and management service by telemedicine and the availability of in-person appointments. I also discussed with the patient that there may be a patient responsible charge related to this service. The patient expressed understanding and agreed to proceed.   Other persons participating in the visit and their role in the encounter: Patient, MD  Patients location: Home Providers location: Clinic  CHIEF COMPLAINT: DCIS, right breast.  INTERVAL HISTORY: Patient agreed to evaluation and follow-up via telephone after initiating anastrozole approximately 4 weeks ago.  In the interim she had a mammogram which noted some calcifications which subsequently led to a biopsy.  Final pathology was reported as benign.  She currently feels well and is asymptomatic.  She is tolerating anastrozole well without significant side effects.  She denies any further joint pain. She has no neurologic complaints.  She denies any recent fevers or illnesses.  She has a good appetite and denies weight loss.  She denies any chest pain, shortness of breath, cough, or hemoptysis.  She denies any nausea, vomiting, constipation, or diarrhea.  She has no urinary complaints.  Patient feels at her baseline offers no specific complaints today.  REVIEW OF SYSTEMS:   Review of Systems  Constitutional: Negative.  Negative for fever, malaise/fatigue and weight loss.  Respiratory: Negative.  Negative for cough,  hemoptysis and shortness of breath.   Cardiovascular: Negative.  Negative for chest pain and leg swelling.  Gastrointestinal: Negative.  Negative for abdominal pain.  Genitourinary: Negative.  Negative for dysuria.  Musculoskeletal: Negative.  Negative for back pain and myalgias.  Skin: Negative.  Negative for rash.  Neurological: Negative.  Negative for dizziness, sensory change, focal weakness and weakness.  Psychiatric/Behavioral: Negative.  The patient is not nervous/anxious.     As per HPI. Otherwise, a complete review of systems is negative.  PAST MEDICAL HISTORY: Past Medical History:  Diagnosis Date   Anxiety    Arthritis    Cancer ([place])    breast   COPD (chronic obstructive pulmonary disease) ([place])    Dyspnea    ONLY WITH EXERTION   GERD (gastroesophageal reflux disease)    "sometimes"   Headache(784.0)    sinus headaches   Myocardial infarction ([place])    in 2011  no damage   Personal history of radiation therapy    Seizures ([place]) 2014   X1   Sleep apnea    USES CPAP    PAST SURGICAL HISTORY: Past Surgical History:  Procedure Laterality Date   ABDOMINAL HYSTERECTOMY     ANTERIOR CERVICAL DECOMP/DISCECTOMY FUSION N/A 01/20/2014   Procedure: ANTERIOR CERVICAL DECOMPRESSION/DISCECTOMY FUSION 2 LEVELS cervical five/six six [place];  Surgeon: Ophelia Charter, MD;  Location: [facility];  Service: Neurosurgery;  Laterality: N/A;   BREAST BIOPSY Right 02/01/2018   x shape, DCIS    BREAST BIOPSY  01/16/2019   coil, pending path   BREAST LUMPECTOMY Right 03/07/2018   Procedure: BREAST LUMPECTOMY/ RE EXCISION;  Surgeon: Herbert Pun, MD;  Location: [facility] ORS;  Service: General;  Laterality: Right;   CARDIAC CATHETERIZATION     2011   CORONARY ANGIOPLASTY WITH STENT PLACEMENT     PLACED IN RCA 2011   EYE SURGERY     lasik   PARTIAL MASTECTOMY WITH NEEDLE LOCALIZATION Right 02/21/2018   Procedure: PARTIAL MASTECTOMY WITH NEEDLE  LOCALIZATION;  Surgeon: Herbert Pun, MD;  Location: [facility] ORS;  Service: General;  Laterality: Right;   ROTATOR CUFF REPAIR     left shoulder   TOE SURGERY Right    has pin and plate in it    FAMILY HISTORY: Family History  Problem Relation Age of Onset   Breast cancer Cousin 26       bilateral at 55 and 62; daughter of maternal aunt who was unaffected   Lung cancer Maternal Aunt        dx 34s; deceased 81s; smoker   Heart attack Father        deceased 47   Stomach cancer Maternal Grandmother     ADVANCED DIRECTIVES (Y/N):  N  HEALTH MAINTENANCE: Social History   Tobacco Use   Smoking status: Former Smoker    Packs/day: 1.00    Years: 30.00    Pack years: 30.00    Types: Cigarettes    Last attempt to quit: 09/13/2015    Years since quitting: 3.3   Smokeless tobacco: Never Used  Substance Use Topics   Alcohol use: Yes    Comment: occasional   Drug use: No     Colonoscopy:  PAP:  Bone density:  Lipid panel:  Allergies  Allergen Reactions   Other     BANDAIDS-OF LEFT ON FOR AN EXTENDED PERIOD OF TIME    Current Outpatient Medications  Medication Sig Dispense Refill   ALPRAZolam (XANAX) 0.25 MG tablet Take 1 tablet (0.25 mg total) by mouth 2 (two) times daily as needed for anxiety. 180 tablet 1   anastrozole (ARIMIDEX) 1 MG tablet Take 1 tablet (1 mg total) by mouth daily. 30 tablet 5   Aspirin-Acetaminophen-Caffeine (EXCEDRIN EXTRA STRENGTH PO) Take 2 tablets by mouth daily as needed (for arm and neck pain).     cetirizine (ZYRTEC) 10 MG tablet Take 10 mg by mouth daily as needed for allergies.      Chlorphen-Pseudoephed-APAP (CORICIDIN D PO) Take by mouth as needed.     citalopram (CELEXA) 40 MG tablet Take 1 tablet (40 mg total) by mouth at bedtime. 90 tablet 3   Cobalamin Combinations (B12 FOLATE PO) Take by mouth.     fluticasone (FLONASE) 50 MCG/ACT nasal spray Place 2 sprays into both nostrils daily as needed for allergies.        Fluticasone-Salmeterol (ADVAIR) 250-50 MCG/DOSE AEPB Inhale 1 puff into the lungs 2 (two) times daily. 60 each 5   folic acid (FOLVITE) 675 MCG tablet Take 1,600 mcg by mouth daily.     hydrochlorothiazide (HYDRODIURIL) 12.5 MG tablet Take 12.5 mg by mouth daily.     methotrexate (RHEUMATREX) 2.5 MG tablet TAKE 8 TABLETS WEEKLY ON WEDNESDAYS     montelukast (SINGULAIR) 10 MG tablet Take 1 tablet (10 mg total) by mouth at bedtime. 90 tablet 3   Omeprazole 20 MG TBEC Take 20 mg by mouth every morning.      Pseudoephedrine-Ibuprofen (ADVIL COLD/SINUS) 30-200 MG TABS Take 1 tablet by mouth as needed.     simvastatin (ZOCOR) 40 MG tablet Take 1 tablet (40 mg total) by mouth at bedtime. 90 tablet 0   traMADol (ULTRAM) 50 MG tablet  2 TAB QD     No current facility-administered medications for this visit.     OBJECTIVE: There were no vitals filed for this visit.   There is no height or weight on file to calculate BMI.    ECOG FS:0 - Asymptomatic   LAB RESULTS:  Lab Results  Component Value Date   NA 141 02/14/2018   K 3.6 02/14/2018   CL 104 02/14/2018   CO2 25 02/14/2018   GLUCOSE 92 02/14/2018   BUN 8 02/14/2018   CREATININE 0.59 02/14/2018   CALCIUM 9.4 02/14/2018   PROT 7.9 02/14/2018   ALBUMIN 4.4 02/14/2018   AST 22 02/14/2018   ALT 23 02/14/2018   ALKPHOS 84 02/14/2018   BILITOT 0.4 02/14/2018   GFRNONAA >60 02/14/2018   GFRAA >60 02/14/2018    Lab Results  Component Value Date   WBC 7.6 05/08/2018   NEUTROABS 13.6 (H) 02/14/2018   HGB 15.2 05/08/2018   HCT 45.7 05/08/2018   MCV 95.5 05/08/2018   PLT 199 05/08/2018     STUDIES: Mm Diag Breast Tomo Bilateral  Result Date: 01/09/2019 CLINICAL DATA:  Malignant lumpectomy of the UPPER OUTER QUADRANT of the RIGHT breast in June, 2019, pathology DCIS.Adjuvant radiation therapy. Patient currently undergoing hormonal chemoprevention with Arimidex. Initial post lumpectomy annual evaluation. Due to issues with  information technology at the time of the mammogram on 01/02/2019, the patient returned on 01/09/2019 for completion images. EXAM: DIGITAL DIAGNOSTIC BILATERAL MAMMOGRAM WITH CAD AND TOMO COMPARISON:  Previous exam(s). ACR Breast Density Category d: The breast tissue is extremely dense, which lowers the sensitivity of mammography. FINDINGS: Tomosynthesis and synthesized full field CC and MLO views of both breasts were obtained. A tomosynthesis and synthesized laterally exaggerated full field CC view of the RIGHT breast, a standard spot magnification MLO view of the lumpectomy site in the RIGHT breast, and standard spot magnification CC and mediolateral views of RIGHT breast calcifications were also obtained. Adjacent groups of coarse heterogeneous calcifications are present in the OUTER RIGHT breast at MIDDLE depth, the individual groups spanning approximately 2 mm each, in total spanning approximately 9 mm. These calcifications are superolateral to a benign coarse dystrophic calcification. Post surgical scar/architectural distortion at the lumpectomy site in the UPPER OUTER RIGHT breast at POSTERIOR depth. A seroma is present at the lumpectomy site. No findings suspicious for malignancy in the LEFT breast. Mammographic images were processed with CAD. IMPRESSION: 1. Indeterminate group of coarse heterogeneous calcifications spanning approximately 9 mm in the OUTER RIGHT breast at MIDDLE depth, adjacent to a benign coarse dystrophic calcification. 2. Expected post lumpectomy changes in the UPPER OUTER quadrant of the RIGHT breast at POSTERIOR depth with a post surgical seroma at the lumpectomy site. 3. No mammographic evidence of malignancy involving the LEFT breast. RECOMMENDATION: Stereotactic tomosynthesis core needle biopsy of the indeterminate RIGHT breast calcifications. I have discussed the findings and recommendations with the patient. The stereotactic tomosynthesis core needle biopsy procedure was discussed  with the patient and her questions were answered. The patient will be contacted by the [facility] in order to schedule the biopsy. BI-RADS CATEGORY  4: Suspicious. Electronically Signed   By: Evangeline Dakin M.D.   On: 01/09/2019 10:30   Mm Clip Placement Right  Result Date: 01/16/2019 CLINICAL DATA:  Evaluate COIL biopsy clip following stereotactic guided RIGHT breast biopsy. EXAM: DIAGNOSTIC RIGHT MAMMOGRAM POST STEREOTACTIC BIOPSY COMPARISON:  Previous exam(s). FINDINGS: Mammographic images were obtained following stereotactic guided biopsy of 0.9 cm  group of calcifications within the UPPER RIGHT breast. The COIL clip is in satisfactory position. IMPRESSION: Satisfactory COIL clip position following stereotactic guided RIGHT breast biopsy. Final Assessment: Post Procedure Mammograms for Marker Placement Electronically Signed   By: Margarette Canada M.D.   On: 01/16/2019 14:16   Mm Rt Breast Bx W Loc Dev 1st Lesion Image Bx Spec Stereo Guide  Addendum Date: 01/18/2019   ADDENDUM REPORT: 01/18/2019 08:59 ADDENDUM: Pathology revealed BENIGN MAMMARY TISSUE WITH MODERATE STROMAL FIBROSIS AND COARSE DYSTROPHIC CALCIFICATIONS of the RIGHT breast, upper. This was found to be concordant by Dr. Hassan Rowan. Pathology results were discussed with the patient by telephone. The patient reported doing well after the biopsy with tenderness at the site. Post biopsy instructions and care were reviewed and questions were answered. The patient was encouraged to call [facility], in [place], [place] for any additional concerns. The patient was instructed to return for annual diagnostic mammography. Pathology results reported by Terie Purser, RN on 01/18/2019. Electronically Signed   By: Margarette Canada M.D.   On: 01/18/2019 08:59   Result Date: 01/18/2019 CLINICAL DATA:  63 year old female for tissue sampling of indeterminate 0.9 cm group of UPPER RIGHT breast calcifications. History of RIGHT breast  DCIS and lumpectomy in 2019. EXAM: RIGHT BREAST STEREOTACTIC CORE NEEDLE BIOPSY COMPARISON:  Previous exams. FINDINGS: The patient and I discussed the procedure of stereotactic-guided biopsy including benefits and alternatives. We discussed the high likelihood of a successful procedure. We discussed the risks of the procedure including infection, bleeding, tissue injury, clip migration, and inadequate sampling. Informed written consent was given. The usual time out protocol was performed immediately prior to the procedure. Using sterile technique and 1% Lidocaine as local anesthetic, under stereotactic guidance, a 9 gauge vacuum assisted device was used to perform core needle biopsy of 0.9 cm group of calcifications within the UPPER RIGHT breast using a SUPERIOR approach. Specimen radiograph was performed showing calcifications. Specimens with calcifications are identified for pathology. At the conclusion of the procedure, a COIL tissue marker clip was deployed into the biopsy cavity. Follow-up 2-view mammogram was performed and dictated separately. IMPRESSION: Stereotactic-guided biopsy of UPPER RIGHT breast calcifications. No apparent complications. Electronically Signed: By: Margarette Canada M.D. On: 01/16/2019 14:15    ASSESSMENT: DCIS, right breast.  PLAN:    1. DCIS, right breast:Patient underwent lumpectomy followed by adjuvant XRT which she completed in approximately September 2019. Because there was no invasive component on her pathology, she did not require adjuvant chemotherapy.    Patient could not tolerate tamoxifen or letrozole secondary to worsening joint pain.  She is tolerating anastrozole well without significant side effects.  Continue treatment for a total of 5 years completing treatment in September 2024.  Her most recent mammogram on January 02, 2019 revealed calcifications, but subsequent biopsy on Jan 16, 2019 was reported as benign without evidence of malignancy.  No intervention is needed  at this time.  Return to clinic in 6 months for routine evaluation.   2.  Osteopenia: Patient had a bone mineral density on November 01, 2018 which reported T score of -1.4.  Continue calcium and vitamin D supplementation.  Repeat in February 2021.   I discussed the assessment and treatment plan with the patient. The patient was provided an opportunity to ask questions and all were answered. The patient agreed with the plan and demonstrated an understanding of the instructions.   The patient was advised to call back or seek an  in-person evaluation if the symptoms worsen or if the condition fails to improve as anticipated.  I provided 25 minutes of non face-to-face telephone visit time during this encounter, and > 50% was spent counseling as documented under my assessment & plan.   Lloyd Huger, MD 01/23/2019 12:05 PM  Cancer Staging Ductal carcinoma in situ (DCIS) of right breast Staging form: Breast, AJCC 8th Edition - Clinical: Stage 0 (cTis (DCIS), cN0, cM0, ER+, PR-, HER2-) - Signed by Lloyd Huger, MD on 03/18/2018

## 2019-01-30 ENCOUNTER — Other Ambulatory Visit: Payer: Self-pay | Admitting: Neurosurgery

## 2019-01-30 DIAGNOSIS — M5416 Radiculopathy, lumbar region: Secondary | ICD-10-CM

## 2019-02-09 ENCOUNTER — Ambulatory Visit
Admission: RE | Admit: 2019-02-09 | Discharge: 2019-02-09 | Disposition: A | Discharge status: Home / Self Care | Payer: Medicare HMO | Source: Ambulatory Visit | Attending: Neurosurgery | Admitting: Neurosurgery

## 2019-02-09 ENCOUNTER — Other Ambulatory Visit: Payer: Self-pay

## 2019-02-09 DIAGNOSIS — M5416 Radiculopathy, lumbar region: Secondary | ICD-10-CM | POA: Diagnosis not present

## 2019-02-09 DIAGNOSIS — M545 Low back pain: Secondary | ICD-10-CM | POA: Diagnosis not present

## 2019-02-15 DIAGNOSIS — M4150 Other secondary scoliosis, site unspecified: Secondary | ICD-10-CM | POA: Diagnosis not present

## 2019-02-15 DIAGNOSIS — M5126 Other intervertebral disc displacement, lumbar region: Secondary | ICD-10-CM | POA: Diagnosis not present

## 2019-02-15 DIAGNOSIS — M5416 Radiculopathy, lumbar region: Secondary | ICD-10-CM | POA: Diagnosis not present

## 2019-02-20 DIAGNOSIS — Z1159 Encounter for screening for other viral diseases: Secondary | ICD-10-CM | POA: Diagnosis not present

## 2019-02-25 ENCOUNTER — Other Ambulatory Visit: Payer: Self-pay

## 2019-02-25 DIAGNOSIS — M5116 Intervertebral disc disorders with radiculopathy, lumbar region: Secondary | ICD-10-CM | POA: Diagnosis not present

## 2019-02-25 DIAGNOSIS — M5126 Other intervertebral disc displacement, lumbar region: Secondary | ICD-10-CM | POA: Diagnosis not present

## 2019-02-25 MED ORDER — SIMVASTATIN 40 MG PO TABS: 40.0000 mg | ORAL_TABLET | Freq: Every day | ORAL | 0 refills | Status: DC

## 2019-03-01 ENCOUNTER — Ambulatory Visit: Payer: Self-pay | Admitting: Nurse Practitioner

## 2019-03-28 ENCOUNTER — Other Ambulatory Visit: Payer: Self-pay

## 2019-03-28 MED ORDER — FLUTICASONE-SALMETEROL 250-50 MCG/DOSE IN AEPB: 1.0000 | INHALATION_SPRAY | Freq: Two times a day (BID) | RESPIRATORY_TRACT | 3 refills | Status: DC

## 2019-04-04 ENCOUNTER — Ambulatory Visit: Payer: Medicare HMO | Admitting: Oncology

## 2019-04-22 DIAGNOSIS — G4733 Obstructive sleep apnea (adult) (pediatric): Secondary | ICD-10-CM | POA: Diagnosis not present

## 2019-04-23 DIAGNOSIS — M0579 Rheumatoid arthritis with rheumatoid factor of multiple sites without organ or systems involvement: Secondary | ICD-10-CM | POA: Diagnosis not present

## 2019-04-23 DIAGNOSIS — Z79899 Other long term (current) drug therapy: Secondary | ICD-10-CM | POA: Diagnosis not present

## 2019-04-24 DIAGNOSIS — M0579 Rheumatoid arthritis with rheumatoid factor of multiple sites without organ or systems involvement: Secondary | ICD-10-CM | POA: Diagnosis not present

## 2019-04-24 DIAGNOSIS — Z79899 Other long term (current) drug therapy: Secondary | ICD-10-CM | POA: Diagnosis not present

## 2019-04-30 DIAGNOSIS — G8929 Other chronic pain: Secondary | ICD-10-CM | POA: Diagnosis not present

## 2019-04-30 DIAGNOSIS — Z9989 Dependence on other enabling machines and devices: Secondary | ICD-10-CM | POA: Diagnosis not present

## 2019-04-30 DIAGNOSIS — I6523 Occlusion and stenosis of bilateral carotid arteries: Secondary | ICD-10-CM | POA: Diagnosis not present

## 2019-04-30 DIAGNOSIS — M25511 Pain in right shoulder: Secondary | ICD-10-CM | POA: Diagnosis not present

## 2019-04-30 DIAGNOSIS — M0579 Rheumatoid arthritis with rheumatoid factor of multiple sites without organ or systems involvement: Secondary | ICD-10-CM | POA: Diagnosis not present

## 2019-04-30 DIAGNOSIS — I2510 Atherosclerotic heart disease of native coronary artery without angina pectoris: Secondary | ICD-10-CM | POA: Diagnosis not present

## 2019-04-30 DIAGNOSIS — G4733 Obstructive sleep apnea (adult) (pediatric): Secondary | ICD-10-CM | POA: Diagnosis not present

## 2019-04-30 DIAGNOSIS — E782 Mixed hyperlipidemia: Secondary | ICD-10-CM | POA: Diagnosis not present

## 2019-04-30 DIAGNOSIS — C50911 Malignant neoplasm of unspecified site of right female breast: Secondary | ICD-10-CM | POA: Diagnosis not present

## 2019-04-30 DIAGNOSIS — Z170 Estrogen receptor positive status [ER+]: Secondary | ICD-10-CM | POA: Diagnosis not present

## 2019-04-30 DIAGNOSIS — Z79899 Other long term (current) drug therapy: Secondary | ICD-10-CM | POA: Diagnosis not present

## 2019-05-08 DIAGNOSIS — H524 Presbyopia: Secondary | ICD-10-CM | POA: Diagnosis not present

## 2019-05-17 ENCOUNTER — Other Ambulatory Visit: Payer: Self-pay

## 2019-05-17 ENCOUNTER — Encounter: Payer: Self-pay | Admitting: Nurse Practitioner

## 2019-05-17 ENCOUNTER — Ambulatory Visit (INDEPENDENT_AMBULATORY_CARE_PROVIDER_SITE_OTHER): Payer: Medicare HMO | Admitting: Nurse Practitioner

## 2019-05-17 VITALS — BP 110/82 | HR 75 | Resp 16 | Ht 65.0 in | Wt 140.6 lb

## 2019-05-17 DIAGNOSIS — J019 Acute sinusitis, unspecified: Secondary | ICD-10-CM

## 2019-05-17 DIAGNOSIS — Z0001 Encounter for general adult medical examination with abnormal findings: Secondary | ICD-10-CM | POA: Diagnosis not present

## 2019-05-17 DIAGNOSIS — F411 Generalized anxiety disorder: Secondary | ICD-10-CM | POA: Diagnosis not present

## 2019-05-17 DIAGNOSIS — R300 Dysuria: Secondary | ICD-10-CM

## 2019-05-17 DIAGNOSIS — E782 Mixed hyperlipidemia: Secondary | ICD-10-CM

## 2019-05-17 MED ORDER — ALPRAZOLAM 0.25 MG PO TABS: 0.2500 mg | ORAL_TABLET | Freq: Two times a day (BID) | ORAL | 3 refills | Status: DC | PRN

## 2019-05-17 MED ORDER — SIMVASTATIN 40 MG PO TABS: 40.0000 mg | ORAL_TABLET | Freq: Every day | ORAL | 3 refills | Status: DC

## 2019-05-17 MED ORDER — AMOXICILLIN-POT CLAVULANATE 875-125 MG PO TABS: 1.0000 | ORAL_TABLET | Freq: Two times a day (BID) | ORAL | 0 refills | Status: DC

## 2019-05-17 NOTE — Progress Notes (Signed)
[facility] [place], [place] [address]  Internal MEDICINE  Office Visit Note  Patient Name: Michelle Moore  [id number]  [id number]  Date of Service: 05/29/2019    Pt is here for routine health maintenance examination  Chief Complaint  Patient presents with  . Medical Management of Chronic Issues  . Annual Exam    medicare wellness visit   . Gastroesophageal Reflux     The patient is here for health maintenance exam. Was recently started on arimidex for prevention of metastatic breast cancer disease. She states that she stated having side effects such as joint pain and severe fatigue. She has been holding it for a few weeks now. Symptoms are improving some. Will discuss with oncology at her next visit. She is doing well overall. She does have intermittent anxiety. Takes low dose alprazolam when needed. Needs to have new prescription. She is due to have routine, fasting labs    Current Medication: Outpatient Encounter Medications as of 05/17/2019  Medication Sig  . ALPRAZolam (XANAX) 0.25 MG tablet Take 1 tablet (0.25 mg total) by mouth 2 (two) times daily as needed for anxiety.  . Aspirin-Acetaminophen-Caffeine (EXCEDRIN EXTRA STRENGTH PO) Take 2 tablets by mouth daily as needed (for arm and neck pain).  . cetirizine (ZYRTEC) 10 MG tablet Take 10 mg by mouth daily as needed for allergies.   . Chlorphen-Pseudoephed-APAP (CORICIDIN D PO) Take by mouth as needed.  . citalopram (CELEXA) 40 MG tablet Take 1 tablet (40 mg total) by mouth at bedtime.  . Cobalamin Combinations (B12 FOLATE PO) Take by mouth.  . fluticasone (FLONASE) 50 MCG/ACT nasal spray Place 2 sprays into both nostrils daily as needed for allergies.   . Fluticasone-Salmeterol (ADVAIR) 250-50 MCG/DOSE AEPB Inhale 1 puff into the lungs 2 (two) times daily.  . folic acid (FOLVITE) 295 MCG tablet Take 1,600 mcg by mouth daily.  . hydrochlorothiazide (HYDRODIURIL) 25 MG tablet Take 25 mg by mouth  daily.  . methotrexate (RHEUMATREX) 2.5 MG tablet TAKE 8 TABLETS WEEKLY ON WEDNESDAYS  . montelukast (SINGULAIR) 10 MG tablet Take 1 tablet (10 mg total) by mouth at bedtime.  . Omeprazole 20 MG TBEC Take 20 mg by mouth every morning.   . Pseudoephedrine-Ibuprofen (ADVIL COLD/SINUS) 30-200 MG TABS Take 1 tablet by mouth as needed.  . simvastatin (ZOCOR) 40 MG tablet Take 1 tablet (40 mg total) by mouth at bedtime.  . traMADol (ULTRAM) 50 MG tablet 2 TAB QD  . [DISCONTINUED] ALPRAZolam (XANAX) 0.25 MG tablet Take 1 tablet (0.25 mg total) by mouth 2 (two) times daily as needed for anxiety.  . [DISCONTINUED] hydrochlorothiazide (HYDRODIURIL) 12.5 MG tablet Take 12.5 mg by mouth daily.  . [DISCONTINUED] simvastatin (ZOCOR) 40 MG tablet Take 1 tablet (40 mg total) by mouth at bedtime.  Marland Kitchen amoxicillin-clavulanate (AUGMENTIN) 875-125 MG tablet Take 1 tablet by mouth 2 (two) times daily.  Marland Kitchen anastrozole (ARIMIDEX) 1 MG tablet Take 1 tablet (1 mg total) by mouth daily. (Patient not taking: Reported on 05/17/2019)   No facility-administered encounter medications on file as of 05/17/2019.     Surgical History: Past Surgical History:  Procedure Laterality Date  . ABDOMINAL HYSTERECTOMY    . ANTERIOR CERVICAL DECOMP/DISCECTOMY FUSION N/A 01/20/2014   Procedure: ANTERIOR CERVICAL DECOMPRESSION/DISCECTOMY FUSION 2 LEVELS cervical five/six six [place];  Surgeon: Ophelia Charter, MD;  Location: [facility];  Service: Neurosurgery;  Laterality: N/A;  . BACK SURGERY    . BREAST BIOPSY Right 02/01/2018  x shape, DCIS   . BREAST BIOPSY  01/16/2019   coil, pending path  . BREAST LUMPECTOMY Right 03/07/2018   Procedure: BREAST LUMPECTOMY/ RE EXCISION;  Surgeon: Herbert Pun, MD;  Location: [facility];  Service: General;  Laterality: Right;  . CARDIAC CATHETERIZATION     2011  . CORONARY ANGIOPLASTY WITH STENT PLACEMENT     PLACED IN RCA 2011  . EYE SURGERY     lasik  . PARTIAL MASTECTOMY WITH NEEDLE  LOCALIZATION Right 02/21/2018   Procedure: PARTIAL MASTECTOMY WITH NEEDLE LOCALIZATION;  Surgeon: Herbert Pun, MD;  Location: [facility] OR[facility];  Service: General;  Laterality: Right;  . ROTATOR CUFF REPAIR     left shoulder  . TOE SURGERY Right    has pin and plate in it    Medical History: Past Medical History:  Diagnosis Date  . Anxiety   . Arthritis   . Cancer ([place])    breast  . COPD (chronic obstructive pulmonary disease) ([place])   . Dyspnea    ONLY WITH EXERTION  . GERD (gastroesophageal reflux disease)    "sometimes"  . Headache(784.0)    sinus headaches  . Myocardial infarction ([place])    in 2011  no damage  . Personal history of radiation therapy   . Seizures ([place]) 2014   X1  . Sleep apnea    USES CPAP    Family History: Family History  Problem Relation Age of Onset  . Breast cancer Cousin 14       bilateral at 61 and 9; daughter of maternal aunt who was unaffected  . Lung cancer Maternal Aunt        dx 6s; deceased 8s; smoker  . Heart attack Father        deceased 73  . Stomach cancer Maternal Grandmother       Review of Systems  Constitutional: Positive for fatigue. Negative for chills and unexpected weight change.  HENT: Negative for congestion, postnasal drip, rhinorrhea, sneezing and sore throat.   Respiratory: Negative for cough, chest tightness and shortness of breath.   Cardiovascular: Negative for chest pain and palpitations.  Gastrointestinal: Negative for abdominal pain, constipation, diarrhea, nausea and vomiting.  Endocrine: Negative for cold intolerance, heat intolerance, polydipsia and polyuria.  Musculoskeletal: Positive for arthralgias and back pain. Negative for joint swelling and neck pain.       Generalized joint pain. Does see rheumatology  Skin: Negative for rash.  Allergic/Immunologic: Positive for environmental allergies.  Neurological: Negative for dizziness, tremors, numbness and headaches.  Hematological: Negative for  adenopathy. Does not bruise/bleed easily.  Psychiatric/Behavioral: Negative for behavioral problems (Depression), sleep disturbance and suicidal ideas. The patient is nervous/anxious.      Today's Vitals   05/17/19 1148  BP: 110/82  Pulse: 75  Resp: 16  SpO2: 98%  Weight: 140 lb 9.6 oz (63.8 kg)  Height: 5\' 5"  (1.651 m)   Body mass index is 23.4 kg/m.  Physical Exam Vitals signs and nursing note reviewed.  Constitutional:      General: She is not in acute distress.    Appearance: Normal appearance. She is well-developed. She is not diaphoretic.  HENT:     Head: Normocephalic and atraumatic.     Nose: Nose normal.     Mouth/Throat:     Pharynx: No oropharyngeal exudate.  Eyes:     Pupils: Pupils are equal, round, and reactive to light.  Neck:     Musculoskeletal: Normal range of motion and neck supple.  Thyroid: No thyromegaly.     Vascular: No JVD.     Trachea: No tracheal deviation.  Cardiovascular:     Rate and Rhythm: Normal rate and regular rhythm.     Pulses: Normal pulses.     Heart sounds: Normal heart sounds. No murmur. No friction rub. No gallop.   Pulmonary:     Effort: Pulmonary effort is normal. No respiratory distress.     Breath sounds: Normal breath sounds. No wheezing or rales.  Chest:     Chest wall: No tenderness.  Abdominal:     General: Bowel sounds are normal.     Palpations: Abdomen is soft.     Tenderness: There is no abdominal tenderness.  Musculoskeletal: Normal range of motion.  Lymphadenopathy:     Cervical: No cervical adenopathy.  Skin:    General: Skin is warm and dry.  Neurological:     Mental Status: She is alert and oriented to person, place, and time.     Cranial Nerves: No cranial nerve deficit.  Psychiatric:        Behavior: Behavior normal.        Thought Content: Thought content normal.        Judgment: Judgment normal.    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) Does the patient have difficulty seeing, even when wearing glasses/contacts?: No Does the patient have difficulty concentrating, remembering, or making decisions?: No Does the patient have difficulty walking or climbing stairs?: No Does the patient have difficulty dressing or bathing?: No Does the patient have difficulty doing errands alone such as visiting a doctor's office or shopping?: No  MMSE - [place] Exam 05/17/2019 11/16/2018  Orientation to time 5 5  Orientation to Place 5 5  Registration 3 3  Attention/ Calculation 5 5  Recall 3 3  Language- name 2 objects 2 2  Language- repeat 1 1  Language- follow 3 step command 3 3  Language- read & follow direction 1 1  Write a sentence 1 1  Copy design 1 1  Total score 30 30    Fall Risk  05/17/2019 12/27/2018 11/16/2018 06/01/2018 11/09/2017  Falls in the past year? 1 0 0 No No  Number falls in past yr: 0 0 - - -  Injury with Fall? 0 0 - - -      LABS: Recent Results (from the past 2160 hour(s))  UA/M w/rflx Culture, Routine     Status: Abnormal   Collection Time: 05/17/19 11:46 AM   Specimen: Urine   URINE  Result Value Ref Range   Specific Gravity, UA 1.011 1.005 - 1.030   pH, UA 6.5 5.0 - 7.5   Color, UA Yellow Yellow   Appearance Ur Clear Clear   Leukocytes,UA Negative Negative   Protein,UA Negative Negative/Trace   Glucose, UA Negative Negative   Ketones, UA Negative Negative   RBC, UA Trace (A) Negative   Bilirubin, UA Negative Negative   Urobilinogen, Ur 0.2 0.2 - 1.0 mg/dL   Nitrite, UA Negative Negative   Microscopic Examination See below:     Comment: Microscopic was indicated and was performed.   Urinalysis Reflex Comment     Comment: This specimen will  not reflex to a Urine Culture.  Microscopic Examination     Status:  Abnormal   Collection Time: 05/17/19 11:46 AM   URINE  Result Value Ref Range   WBC, UA 0-5 0 - 5 /hpf   RBC 0-2 0 - 2 /hpf   Epithelial Cells (non renal) 0-10 0 - 10 /hpf   Casts Present (A) None seen /lpf   Cast Type Hyaline casts N/A   Mucus, UA Present Not Estab.   Bacteria, UA Few None seen/Few  Comprehensive metabolic panel     Status: Abnormal   Collection Time: 05/24/19  9:55 AM  Result Value Ref Range   Glucose 93 65 - 99 mg/dL   BUN 9 8 - 27 mg/dL   Creatinine, Ser 0.81 0.57 - 1.00 mg/dL   GFR calc non Af Amer 78 >59 mL/min/1.73   GFR calc Af Amer 90 >59 mL/min/1.73   BUN/Creatinine Ratio 11 (L) 12 - 28   Sodium 140 134 - 144 mmol/L   Potassium 3.1 (L) 3.5 - 5.2 mmol/L   Chloride 95 (L) 96 - 106 mmol/L   CO2 26 20 - 29 mmol/L   Calcium 8.9 8.7 - 10.3 mg/dL   Total Protein 6.6 6.0 - 8.5 g/dL   Albumin 4.3 3.8 - 4.8 g/dL   Globulin, Total 2.3 1.5 - 4.5 g/dL   Albumin/Globulin Ratio 1.9 1.2 - 2.2   Bilirubin Total 0.3 0.0 - 1.2 mg/dL   Alkaline Phosphatase 80 39 - 117 IU/L   AST 17 0 - 40 IU/L   ALT 14 0 - 32 IU/L  CBC     Status: None   Collection Time: 05/24/19  9:55 AM  Result Value Ref Range   WBC 7.7 3.4 - 10.8 x10E3/uL   RBC 4.84 3.77 - 5.28 x10E6/uL   Hemoglobin 15.6 11.1 - 15.9 g/dL   Hematocrit 45.7 34.0 - 46.6 %   MCV 94 79 - 97 fL   MCH 32.2 26.6 - 33.0 pg   MCHC 34.1 31.5 - 35.7 g/dL   RDW 13.1 11.7 - 15.4 %   Platelets 223 150 - 450 x10E3/uL  Lipid Panel w/o Chol/HDL Ratio     Status: Abnormal   Collection Time: 05/24/19  9:55 AM  Result Value Ref Range   Cholesterol, Total 182 100 - 199 mg/dL   Triglycerides 123 0 - 149 mg/dL   HDL 53 >39 mg/dL   VLDL Cholesterol Cal 22 5 - 40 mg/dL   LDL Chol Calc (NIH) 107 (H) 0 - 99 mg/dL  B12 and Folate Panel     Status: Abnormal   Collection Time: 05/24/19  9:55 AM  Result Value Ref Range   Vitamin B-12 1,526 (H) 232 - 1,245 pg/mL   Folate >20.0 >3.0 ng/mL    Comment: A serum folate concentration of  less than 3.1 ng/mL is considered to represent clinical deficiency.   T4, free     Status: None   Collection Time: 05/24/19  9:55 AM  Result Value Ref Range   Free T4 1.07 0.82 - 1.77 ng/dL  TSH     Status: None   Collection Time: 05/24/19  9:55 AM  Result Value Ref Range   TSH 3.520 0.450 - 4.500 uIU/mL  VITAMIN D 25 Hydroxy (Vit-D Deficiency, Fractures)     Status: Abnormal   Collection Time: 05/24/19  9:55 AM  Result Value Ref Range   Vit D, 25-Hydroxy 26.4 (L) 30.0 - 100.0 ng/mL    Comment:  Vitamin D deficiency has been defined by the [place] practice guideline as a level of serum 25-OH vitamin D less than 20 ng/mL (1,2). The Endocrine Society went on to further define vitamin D insufficiency as a level between 21 and 29 ng/mL (2). 1. IOM (Institute of Medicine). 2010. Dietary reference    intakes for calcium and D. [place]: The    [employer]. 2. Holick MF, Binkley Isabella, Bischoff-Ferrari HA, et al.    Evaluation, treatment, and prevention of vitamin D    deficiency: an Endocrine Society clinical practice    guideline. JCEM. 2011 Jul; 96(7):1911-30.    Assessment/Plan:  1. Encounter for general adult medical examination with abnormal findings Annual health maintenance exam today.   2. Acute sinusitis with symptoms > 10 days Start augmentin 875mg  twice daily for 10 days. Rest and increase fluids.  - amoxicillin-clavulanate (AUGMENTIN) 875-125 MG tablet; Take 1 tablet by mouth 2 (two) times daily.  Dispense: 20 tablet; Refill: 0  3. Mixed hyperlipidemia Continue simvastatin 40mg  daily.  - simvastatin (ZOCOR) 40 MG tablet; Take 1 tablet (40 mg total) by mouth at bedtime.  Dispense: 90 tablet; Refill: 3  4. Generalized anxiety disorder May continue alprazolam 0.25mg  twice daily as needed for acute anxiety. New prescription provided today.  - ALPRAZolam (XANAX) 0.25 MG tablet; Take 1 tablet (0.25 mg total) by mouth 2  (two) times daily as needed for anxiety.  Dispense: 120 tablet; Refill: 3  5. Dysuria - UA/M w/rflx Culture, Routine  General Counseling: Cristela verbalizes understanding of the findings of todays visit and agrees with plan of treatment. I have discussed any further diagnostic evaluation that may be needed or ordered today. We also reviewed her medications today. she has been encouraged to call the office with any questions or concerns that should arise related to todays visit.    Counseling:  This patient was seen by Leretha Pol FNP Collaboration with Dr Lavera Guise as a part of collaborative care agreement  Orders Placed This Encounter  Procedures  . Microscopic Examination  . UA/M w/rflx Culture, Routine    Meds ordered this encounter  Medications  . simvastatin (ZOCOR) 40 MG tablet    Sig: Take 1 tablet (40 mg total) by mouth at bedtime.    Dispense:  90 tablet    Refill:  3    Order Specific Question:   Supervising Provider    Answer:   Lavera Guise [[id number]]  . ALPRAZolam (XANAX) 0.25 MG tablet    Sig: Take 1 tablet (0.25 mg total) by mouth 2 (two) times daily as needed for anxiety.    Dispense:  120 tablet    Refill:  3    Order Specific Question:   Supervising Provider    Answer:   Lavera Guise [[id number]]  . amoxicillin-clavulanate (AUGMENTIN) 875-125 MG tablet    Sig: Take 1 tablet by mouth 2 (two) times daily.    Dispense:  20 tablet    Refill:  0    Order Specific Question:   Supervising Provider    Answer:   Lavera Guise [[id number]]    Time spent: [place], MD  Internal Medicine

## 2019-05-18 LAB — MICROSCOPIC EXAMINATION

## 2019-05-18 LAB — UA/M W/RFLX CULTURE, ROUTINE
Bilirubin, UA: NEGATIVE
Glucose, UA: NEGATIVE
Ketones, UA: NEGATIVE
Leukocytes,UA: NEGATIVE
Nitrite, UA: NEGATIVE
Protein,UA: NEGATIVE
Specific Gravity, UA: 1.011 (ref 1.005–1.030)
Urobilinogen, Ur: 0.2 mg/dL (ref 0.2–1.0)
pH, UA: 6.5 (ref 5.0–7.5)

## 2019-05-24 ENCOUNTER — Other Ambulatory Visit: Payer: Self-pay | Admitting: Nurse Practitioner

## 2019-05-24 DIAGNOSIS — I10 Essential (primary) hypertension: Secondary | ICD-10-CM | POA: Diagnosis not present

## 2019-05-24 DIAGNOSIS — E782 Mixed hyperlipidemia: Secondary | ICD-10-CM | POA: Diagnosis not present

## 2019-05-24 DIAGNOSIS — D509 Iron deficiency anemia, unspecified: Secondary | ICD-10-CM | POA: Diagnosis not present

## 2019-05-24 DIAGNOSIS — Z0001 Encounter for general adult medical examination with abnormal findings: Secondary | ICD-10-CM | POA: Diagnosis not present

## 2019-05-24 DIAGNOSIS — E559 Vitamin D deficiency, unspecified: Secondary | ICD-10-CM | POA: Diagnosis not present

## 2019-05-25 LAB — COMPREHENSIVE METABOLIC PANEL
ALT: 14 IU/L (ref 0–32)
AST: 17 IU/L (ref 0–40)
Albumin/Globulin Ratio: 1.9 (ref 1.2–2.2)
Albumin: 4.3 g/dL (ref 3.8–4.8)
Alkaline Phosphatase: 80 IU/L (ref 39–117)
BUN/Creatinine Ratio: 11 — ABNORMAL LOW (ref 12–28)
BUN: 9 mg/dL (ref 8–27)
Bilirubin Total: 0.3 mg/dL (ref 0.0–1.2)
CO2: 26 mmol/L (ref 20–29)
Calcium: 8.9 mg/dL (ref 8.7–10.3)
Chloride: 95 mmol/L — ABNORMAL LOW (ref 96–106)
Creatinine, Ser: 0.81 mg/dL (ref 0.57–1.00)
GFR calc Af Amer: 90 mL/min/{1.73_m2} (ref >59)
GFR calc non Af Amer: 78 mL/min/{1.73_m2} (ref >59)
Globulin, Total: 2.3 g/dL (ref 1.5–4.5)
Glucose: 93 mg/dL (ref 65–99)
Potassium: 3.1 mmol/L — ABNORMAL LOW (ref 3.5–5.2)
Sodium: 140 mmol/L (ref 134–144)
Total Protein: 6.6 g/dL (ref 6.0–8.5)

## 2019-05-25 LAB — CBC
Hematocrit: 45.7 % (ref 34.0–46.6)
Hemoglobin: 15.6 g/dL (ref 11.1–15.9)
MCH: 32.2 pg (ref 26.6–33.0)
MCHC: 34.1 g/dL (ref 31.5–35.7)
MCV: 94 fL (ref 79–97)
Platelets: 223 10*3/uL (ref 150–450)
RBC: 4.84 x10E6/uL (ref 3.77–5.28)
RDW: 13.1 % (ref 11.7–15.4)
WBC: 7.7 10*3/uL (ref 3.4–10.8)

## 2019-05-25 LAB — B12 AND FOLATE PANEL
Folate: >20 ng/mL (ref >3.0)
Vitamin B-12: 1526 pg/mL — ABNORMAL HIGH (ref 232–1245)

## 2019-05-25 LAB — LIPID PANEL W/O CHOL/HDL RATIO
Cholesterol, Total: 182 mg/dL (ref 100–199)
HDL: 53 mg/dL (ref >39)
LDL Chol Calc (NIH): 107 mg/dL — ABNORMAL HIGH (ref 0–99)
Triglycerides: 123 mg/dL (ref 0–149)
VLDL Cholesterol Cal: 22 mg/dL (ref 5–40)

## 2019-05-25 LAB — VITAMIN D 25 HYDROXY (VIT D DEFICIENCY, FRACTURES): Vit D, 25-Hydroxy: 26.4 ng/mL — ABNORMAL LOW (ref 30.0–100.0)

## 2019-05-25 LAB — TSH: TSH: 3.52 u[IU]/mL (ref 0.450–4.500)

## 2019-05-25 LAB — T4, FREE: Free T4: 1.07 ng/dL (ref 0.82–1.77)

## 2019-05-29 DIAGNOSIS — Z0001 Encounter for general adult medical examination with abnormal findings: Secondary | ICD-10-CM | POA: Insufficient documentation

## 2019-05-29 DIAGNOSIS — R300 Dysuria: Secondary | ICD-10-CM | POA: Insufficient documentation

## 2019-05-30 ENCOUNTER — Other Ambulatory Visit: Payer: Self-pay | Admitting: Nurse Practitioner

## 2019-05-30 DIAGNOSIS — E876 Hypokalemia: Secondary | ICD-10-CM

## 2019-05-30 MED ORDER — POTASSIUM CHLORIDE CRYS ER 10 MEQ PO TBCR: 10.0000 meq | EXTENDED_RELEASE_TABLET | Freq: Every day | ORAL | 3 refills | Status: DC

## 2019-05-30 NOTE — Progress Notes (Signed)
Can we fax a copy of her lab results to Dr. Nehemiah Massed, cardiologist? They are not able to access labs on Epic. Thanks.

## 2019-05-30 NOTE — Progress Notes (Signed)
Discussed lab results with patient. Started potassium supplement 74mEq daily. Recheck BMP in about 3 to 4 weeks and adjust dosing as indicated. Recommended OTC vitamin d supplement every day.

## 2019-05-30 NOTE — Progress Notes (Signed)
Discussed lab results with patient. Started potassium supplement 52mEq daily. Recheck BMP in about 3 to 4 weeks and adjust dosing as indicated. Recommended OTC vitamin d supplement every day.

## 2019-06-07 ENCOUNTER — Ambulatory Visit: Payer: Medicare HMO | Admitting: Oncology

## 2019-06-07 ENCOUNTER — Ambulatory Visit: Payer: Medicare HMO | Admitting: Radiation Oncology

## 2019-06-19 ENCOUNTER — Other Ambulatory Visit: Payer: Self-pay

## 2019-06-20 ENCOUNTER — Ambulatory Visit
Admission: RE | Admit: 2019-06-20 | Discharge: 2019-06-20 | Disposition: A | Discharge status: Home / Self Care | Payer: Medicare HMO | Source: Ambulatory Visit | Attending: Radiation Oncology | Admitting: Radiation Oncology

## 2019-06-20 ENCOUNTER — Other Ambulatory Visit: Payer: Self-pay

## 2019-06-20 ENCOUNTER — Encounter: Payer: Self-pay | Admitting: Radiation Oncology

## 2019-06-20 VITALS — BP 122/62 | HR 70 | Temp 99.4°F | Resp 18 | Wt 139.8 lb

## 2019-06-20 DIAGNOSIS — D0511 Intraductal carcinoma in situ of right breast: Secondary | ICD-10-CM | POA: Insufficient documentation

## 2019-06-20 DIAGNOSIS — M129 Arthropathy, unspecified: Secondary | ICD-10-CM | POA: Diagnosis not present

## 2019-06-20 DIAGNOSIS — Z923 Personal history of irradiation: Secondary | ICD-10-CM | POA: Diagnosis not present

## 2019-06-20 DIAGNOSIS — Z79811 Long term (current) use of aromatase inhibitors: Secondary | ICD-10-CM | POA: Diagnosis not present

## 2019-06-20 DIAGNOSIS — Z170 Estrogen receptor positive status [ER+]: Secondary | ICD-10-CM | POA: Insufficient documentation

## 2019-06-20 NOTE — Progress Notes (Signed)
Radiation Oncology Follow up Note  Name: Michelle Moore   Date:   06/20/2019 MRN:  [id number] DOB: October 04, 1955    This 63 y.o. 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  Seen today in routine follow-up she is doing fair.  She states since her radiation she is felt achy with exacerbation of arthritis she does see Dr. Jefm Bryant for that.  She has been on several antiestrogen therapies currently is on arimadex.  She specifically denies breast tenderness cough or bone pain.  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  Patient and ultrasound back in April 2020 I have not seen an official report although was told by patient it was fine.  COMPLICATIONS OF TREATMENT: none  FOLLOW UP COMPLIANCE: keeps appointments   PHYSICAL EXAM:  BP 122/62   Pulse 70   Temp 99.4 F (37.4 C)   Resp 18   Wt 139 lb 12.8 oz (63.4 kg)   BMI 23.26 kg/m  Lungs are clear to A&P cardiac examination essentially unremarkable with regular rate and rhythm. No dominant mass or nodularity is noted in either breast in 2 positions examined. Incision is well-healed. No axillary or supraclavicular adenopathy is appreciated. Cosmetic result is excellent.  Well-developed well-nourished patient in NAD. HEENT reveals PERLA, EOMI, discs not visualized.  Oral cavity is clear. No oral mucosal lesions are identified. Neck is clear without evidence of cervical or supraclavicular adenopathy. Lungs are clear to A&P. Cardiac examination is essentially unremarkable with regular rate and rhythm without murmur rub or thrill. Abdomen is benign with no organomegaly or masses  noted. Motor sensory and DTR levels are equal and symmetric in the upper and lower extremities. Cranial nerves II through XII are grossly intact. Proprioception is intact. No peripheral adenopathy or edema is identified. No motor or sensory levels are noted. Crude visual fields are within normal range.  RADIOLOGY RESULTS: Ultrasound and mammograms requested for my review  PLAN: Present time patient is doing well 1 year out with no evidence of disease  Explained to her this exacerbation of arthritis is most likely related to her antiestrogen therapy and she should discuss that with medical oncology.  She is on her third antiestrogen medication.  She also continues follow-up care with Dr. Jefm Bryant for her arthritis.  I have asked to see her back in 1 year for follow-up.  Patient knows to call sooner with any concerns.  I would like to take this opportunity to thank you for allowing me to participate in the care of your patient.Noreene Filbert, MD

## 2019-06-26 DIAGNOSIS — M7581 Other shoulder lesions, right shoulder: Secondary | ICD-10-CM | POA: Diagnosis not present

## 2019-06-26 DIAGNOSIS — M7541 Impingement syndrome of right shoulder: Secondary | ICD-10-CM | POA: Diagnosis not present

## 2019-06-27 ENCOUNTER — Other Ambulatory Visit: Payer: Self-pay | Admitting: Student

## 2019-06-27 DIAGNOSIS — M7541 Impingement syndrome of right shoulder: Secondary | ICD-10-CM

## 2019-06-27 DIAGNOSIS — M7581 Other shoulder lesions, right shoulder: Secondary | ICD-10-CM

## 2019-07-01 DIAGNOSIS — M5412 Radiculopathy, cervical region: Secondary | ICD-10-CM | POA: Diagnosis not present

## 2019-07-01 DIAGNOSIS — M5030 Other cervical disc degeneration, unspecified cervical region: Secondary | ICD-10-CM | POA: Diagnosis not present

## 2019-07-01 DIAGNOSIS — M5416 Radiculopathy, lumbar region: Secondary | ICD-10-CM | POA: Diagnosis not present

## 2019-07-01 DIAGNOSIS — M5136 Other intervertebral disc degeneration, lumbar region: Secondary | ICD-10-CM | POA: Diagnosis not present

## 2019-07-10 ENCOUNTER — Other Ambulatory Visit: Payer: Self-pay

## 2019-07-10 ENCOUNTER — Ambulatory Visit
Admission: RE | Admit: 2019-07-10 | Discharge: 2019-07-10 | Disposition: A | Discharge status: Home / Self Care | Payer: Medicare HMO | Source: Ambulatory Visit | Attending: Student | Admitting: Student

## 2019-07-10 DIAGNOSIS — M7581 Other shoulder lesions, right shoulder: Secondary | ICD-10-CM

## 2019-07-10 DIAGNOSIS — M7541 Impingement syndrome of right shoulder: Secondary | ICD-10-CM | POA: Insufficient documentation

## 2019-07-10 DIAGNOSIS — M25511 Pain in right shoulder: Secondary | ICD-10-CM | POA: Diagnosis not present

## 2019-07-15 ENCOUNTER — Encounter: Payer: Self-pay | Admitting: Nurse Practitioner

## 2019-07-15 ENCOUNTER — Ambulatory Visit (INDEPENDENT_AMBULATORY_CARE_PROVIDER_SITE_OTHER): Payer: Medicare HMO | Admitting: Nurse Practitioner

## 2019-07-15 ENCOUNTER — Other Ambulatory Visit: Payer: Self-pay

## 2019-07-15 VITALS — Temp 94.0°F | Wt 139.0 lb

## 2019-07-15 DIAGNOSIS — J0140 Acute pansinusitis, unspecified: Secondary | ICD-10-CM | POA: Diagnosis not present

## 2019-07-15 MED ORDER — AMOXICILLIN-POT CLAVULANATE 875-125 MG PO TABS: 1.0000 | ORAL_TABLET | Freq: Two times a day (BID) | ORAL | 0 refills | Status: DC

## 2019-07-15 NOTE — Progress Notes (Signed)
[facility] [place], [place] [address]  Internal MEDICINE  Telephone Visit  Patient Name: Michelle Moore  [id number]  [id number]  Date of Service: 07/15/2019  I connected with the patient at 1:47pm by telephone and verified the patients identity using two identifiers.   I discussed the limitations, risks, security and privacy concerns of performing an evaluation and management service by telephone and the availability of in person appointments. I also discussed with the patient that there may be a patient responsible charge related to the service.  The patient expressed understanding and agrees to proceed.    Chief Complaint  Patient presents with  . Telephone Assessment  . Telephone Screen  . Cough    believes its from drainage, not too much   . Ear Pain  . Nasal Congestion    The patient has been contacted via webcam for follow up visit due to concerns for spread of novel coronavirus. Patient having facial pain in maxillary sinus region. Has headache, scratchy throat, and ear pain. She also has nasal congestion. She denies nausea, vomiting, or fever. She states that symptoms have been present for about a week.       Current Medication: Outpatient Encounter Medications as of 07/15/2019  Medication Sig  . ALPRAZolam (XANAX) 0.25 MG tablet Take 1 tablet (0.25 mg total) by mouth 2 (two) times daily as needed for anxiety.  . Aspirin-Acetaminophen-Caffeine (EXCEDRIN EXTRA STRENGTH PO) Take 2 tablets by mouth daily as needed (for arm and neck pain).  . cetirizine (ZYRTEC) 10 MG tablet Take 10 mg by mouth daily as needed for allergies.   . Chlorphen-Pseudoephed-APAP (CORICIDIN D PO) Take by mouth as needed.  . citalopram (CELEXA) 40 MG tablet Take 1 tablet (40 mg total) by mouth at bedtime.  . Cobalamin Combinations (B12 FOLATE PO) Take by mouth.  . fluticasone (FLONASE) 50 MCG/ACT nasal spray Place 2 sprays into both nostrils daily as needed for allergies.   .  Fluticasone-Salmeterol (ADVAIR) 250-50 MCG/DOSE AEPB Inhale 1 puff into the lungs 2 (two) times daily.  . folic acid (FOLVITE) 427 MCG tablet Take 1,600 mcg by mouth daily.  . hydrochlorothiazide (HYDRODIURIL) 25 MG tablet Take 25 mg by mouth daily.  . methotrexate (RHEUMATREX) 2.5 MG tablet TAKE 8 TABLETS WEEKLY ON WEDNESDAYS  . montelukast (SINGULAIR) 10 MG tablet Take 1 tablet (10 mg total) by mouth at bedtime.  . Omeprazole 20 MG TBEC Take 20 mg by mouth every morning.   . potassium chloride (K-DUR) 10 MEQ tablet Take 1 tablet (10 mEq total) by mouth daily.  . Pseudoephedrine-Ibuprofen (ADVIL COLD/SINUS) 30-200 MG TABS Take 1 tablet by mouth as needed.  . simvastatin (ZOCOR) 40 MG tablet Take 1 tablet (40 mg total) by mouth at bedtime.  . traMADol (ULTRAM) 50 MG tablet 2 TAB QD  . amoxicillin-clavulanate (AUGMENTIN) 875-125 MG tablet Take 1 tablet by mouth 2 (two) times daily.  . [DISCONTINUED] amoxicillin-clavulanate (AUGMENTIN) 875-125 MG tablet Take 1 tablet by mouth 2 (two) times daily. (Patient not taking: Reported on 07/15/2019)  . [DISCONTINUED] anastrozole (ARIMIDEX) 1 MG tablet Take 1 tablet (1 mg total) by mouth daily. (Patient not taking: Reported on 05/17/2019)   No facility-administered encounter medications on file as of 07/15/2019.     Surgical History: Past Surgical History:  Procedure Laterality Date  . ABDOMINAL HYSTERECTOMY    . ANTERIOR CERVICAL DECOMP/DISCECTOMY FUSION N/A 01/20/2014   Procedure: ANTERIOR CERVICAL DECOMPRESSION/DISCECTOMY FUSION 2 LEVELS cervical five/six six Tarry Kos;  Surgeon:  Ophelia Charter, MD;  Location: [facility];  Service: Neurosurgery;  Laterality: N/A;  . BACK SURGERY    . 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 BREAST BIOPSY  01/16/2019   coil, pending path  . BREAST LUMPECTOMY Right 03/07/2018   Procedure: BREAST LUMPECTOMY/ RE EXCISION;  Surgeon: Herbert Pun, MD;  Location: [facility] OR[facility];  Service: General;  Laterality:  Right;  . CARDIAC CATHETERIZATION     2011  . CORONARY ANGIOPLASTY WITH STENT PLACEMENT     PLACED IN RCA 2011  . EYE SURGERY     lasik  . PARTIAL MASTECTOMY WITH NEEDLE LOCALIZATION Right 02/21/2018   Procedure: PARTIAL MASTECTOMY WITH NEEDLE LOCALIZATION;  Surgeon: Herbert Pun, MD;  Location: [facility] ORS;  Service: General;  Laterality: Right;  . ROTATOR CUFF REPAIR     left shoulder  . TOE SURGERY Right    has pin and plate in it    Medical History: Past Medical History:  Diagnosis Date  . Anxiety   . Arthritis   . Cancer ([place])    breast  . COPD (chronic obstructive pulmonary disease) ([place])   . Dyspnea    ONLY WITH EXERTION  . GERD (gastroesophageal reflux disease)    "sometimes"  . Headache(784.0)    sinus headaches  . Myocardial infarction ([place])    in 2011  no damage  . Personal history of radiation therapy   . Seizures ([place]) 2014   X1  . Sleep apnea    USES CPAP    Family History: Family History  Problem Relation Age of Onset  . Breast cancer Cousin 41       bilateral at 5 and 4; daughter of maternal aunt who was unaffected  . Lung cancer Maternal Aunt        dx 79s; deceased 50s; smoker  . Heart attack Father        deceased 66  . Stomach cancer Maternal Grandmother     Social History   Socioeconomic History  . Marital status: Married    Spouse name: Not on file  . Number of children: Not on file  . Years of education: Not on file  . Highest education level: Not on file  Occupational History  . Not on file  Social Needs  . Financial resource strain: Not on file  . Food insecurity    Worry: Not on file    Inability: Not on file  . Transportation needs    Medical: Not on file    Non-medical: Not on file  Tobacco Use  . Smoking status: Former Smoker    Packs/day: 1.00    Years: 30.00    Pack years: 30.00    Types: Cigarettes    Quit date: 09/13/2015    Years since quitting: 3.8  . Smokeless tobacco: Never Used  Substance and  Sexual Activity  . Alcohol use: Yes    Comment: occasional  . Drug use: No  . Sexual activity: Not on file  Lifestyle  . Physical activity    Days per week: Not on file    Minutes per session: Not on file  . Stress: Not on file  Relationships  . Social Herbalist on phone: Not on file    Gets together: Not on file    Attends religious service: Not on file    Active member of club or organization: Not on file    Attends meetings of clubs or  organizations: Not on file    Relationship status: Not on file  . Intimate partner violence    Fear of current or ex partner: Not on file    Emotionally abused: Not on file    Physically abused: Not on file    Forced sexual activity: Not on file  Other Topics Concern  . Not on file  Social History Narrative  . Not on file      Review of Systems  Constitutional: Positive for chills and fatigue. Negative for fever.  HENT: Positive for congestion, ear pain, postnasal drip, rhinorrhea, sinus pain and sore throat. Negative for voice change.   Respiratory: Positive for cough. Negative for wheezing.   Cardiovascular: Negative for chest pain and palpitations.  Gastrointestinal: Negative for diarrhea, nausea and vomiting.  Musculoskeletal: Negative for arthralgias and myalgias.  Allergic/Immunologic: Positive for environmental allergies.  Neurological: Positive for headaches.  Hematological: Negative for adenopathy.    Today's Vitals   07/15/19 1336  Temp: (!) 94 F (34.4 C)  Weight: 139 lb (63 kg)   Body mass index is 23.13 kg/m.  Observation/Objective:   The patient is alert and oriented. She is pleasant and answers all questions appropriately. Breathing is non-labored. She is in no acute distress at this time. The patient is nasally congested and appears to feel poorly.    Assessment/Plan: 1. Acute non-recurrent pansinusitis Start augmentin 875mg  bid for 10 days. Rest and increase fluids. Take OTC medication as needed  and as indicated to alleviate symptoms.  - amoxicillin-clavulanate (AUGMENTIN) 875-125 MG tablet; Take 1 tablet by mouth 2 (two) times daily.  Dispense: 20 tablet; Refill: 0  General Counseling: Michelle Moore verbalizes understanding of the findings of today's phone visit and agrees with plan of treatment. I have discussed any further diagnostic evaluation that may be needed or ordered today. We also reviewed her medications today. she has been encouraged to call the office with any questions or concerns that should arise related to todays visit.   This patient was seen by [place] with Dr Lavera Guise as a part of collaborative care agreement  Meds ordered this encounter  Medications  . amoxicillin-clavulanate (AUGMENTIN) 875-125 MG tablet    Sig: Take 1 tablet by mouth 2 (two) times daily.    Dispense:  20 tablet    Refill:  0    Order Specific Question:   Supervising Provider    Answer:   Lavera Guise [[id number]]    Time spent: 65 Minutes    Dr Lavera Guise Internal medicine

## 2019-07-19 DIAGNOSIS — M75121 Complete rotator cuff tear or rupture of right shoulder, not specified as traumatic: Secondary | ICD-10-CM | POA: Diagnosis not present

## 2019-07-19 DIAGNOSIS — M24111 Other articular cartilage disorders, right shoulder: Secondary | ICD-10-CM | POA: Diagnosis not present

## 2019-07-19 DIAGNOSIS — M7581 Other shoulder lesions, right shoulder: Secondary | ICD-10-CM | POA: Insufficient documentation

## 2019-07-19 DIAGNOSIS — M7521 Bicipital tendinitis, right shoulder: Secondary | ICD-10-CM | POA: Diagnosis not present

## 2019-07-23 ENCOUNTER — Other Ambulatory Visit: Payer: Self-pay | Admitting: Surgery

## 2019-07-27 NOTE — Progress Notes (Signed)
  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

## 2019-07-31 ENCOUNTER — Other Ambulatory Visit: Payer: Self-pay

## 2019-07-31 ENCOUNTER — Encounter: Payer: Self-pay | Admitting: Oncology

## 2019-07-31 DIAGNOSIS — M0579 Rheumatoid arthritis with rheumatoid factor of multiple sites without organ or systems involvement: Secondary | ICD-10-CM | POA: Diagnosis not present

## 2019-07-31 DIAGNOSIS — Z79899 Other long term (current) drug therapy: Secondary | ICD-10-CM | POA: Diagnosis not present

## 2019-07-31 NOTE — Progress Notes (Signed)
Patient pre screened for office appointment, no questions or concerns today. Patient reminded of upcoming appointment time and date. 

## 2019-08-01 ENCOUNTER — Other Ambulatory Visit: Payer: Self-pay

## 2019-08-01 ENCOUNTER — Encounter
Admission: RE | Admit: 2019-08-01 | Discharge: 2019-08-01 | Disposition: A | Discharge status: Home / Self Care | Payer: Medicare HMO | Source: Ambulatory Visit | Attending: Surgery | Admitting: Surgery

## 2019-08-01 ENCOUNTER — Inpatient Hospital Stay: Payer: Medicare HMO | Admitting: Oncology

## 2019-08-01 DIAGNOSIS — R9431 Abnormal electrocardiogram [ECG] [EKG]: Secondary | ICD-10-CM | POA: Insufficient documentation

## 2019-08-01 DIAGNOSIS — I252 Old myocardial infarction: Secondary | ICD-10-CM | POA: Insufficient documentation

## 2019-08-01 DIAGNOSIS — Z01818 Encounter for other preprocedural examination: Secondary | ICD-10-CM | POA: Insufficient documentation

## 2019-08-01 LAB — POTASSIUM: Potassium: 3 mmol/L — ABNORMAL LOW (ref 3.5–5.1)

## 2019-08-01 NOTE — Patient Instructions (Addendum)
Your procedure is scheduled on: 08-07-19 [facility] Report to Same Day Surgery 2nd floor medical mall [facility] Entrance-take elevator on left to 2nd floor.  Check in with surgery information desk.) To find out your arrival time please call [phone] between 1PM - 3PM on 08-06-19 TUESDAY  Remember: Instructions that are not followed completely may result in serious medical risk, up to and including death, or upon the discretion of your surgeon and anesthesiologist your surgery may need to be rescheduled.    _x___ 1. Do not eat food after midnight the night before your procedure. NO GUM OR CANDY AFTER MIDNIGHT.  You may drink clear liquids up to 2 hours before you are scheduled to arrive at the hospital for your procedure.  Do not drink clear liquids within 2 hours of your scheduled arrival to the hospital.  Clear liquids include  --Water or Apple juice without pulp  --Gatorade  --Black Coffee or Clear Tea (No milk, no creamers, do not add anything to the coffee or Tea   ____Ensure clear carbohydrate drink on the way to the hospital for bariatric patients  _X___Ensure clear carbohydrate drink 3 hours before surgery.     __x__ 2. No Alcohol for 24 hours before or after surgery.   __x__3. No Smoking or e-cigarettes for 24 prior to surgery.  Do not use any chewable tobacco products for at least 6 hour prior to surgery   ____  4. Bring all medications with you on the day of surgery if instructed.    __x__ 5. Notify your doctor if there is any change in your medical condition     (cold, fever, infections).    x___6. On the morning of surgery brush your teeth with toothpaste and water.  You may rinse your mouth with mouth wash if you wish.  Do not swallow any toothpaste or mouthwash.   Do not wear jewelry, make-up, hairpins, clips or nail polish.  Do not wear lotions, powders, or perfumes.   Do not shave 48 hours prior to surgery. Men may shave face and neck.  Do not bring  valuables to the hospital.    [facility] is not responsible for any belongings or valuables.               Contacts, dentures or bridgework may not be worn into surgery.  Leave your suitcase in the car. After surgery it may be brought to your room.  For patients admitted to the hospital, discharge time is determined by your treatment team.  _  Patients discharged the day of surgery will not be allowed to drive home.  You will need someone to drive you home and stay with you the night of your procedure.    Please read over the following fact sheets that you were given:   [facility] Preparing for Surgery and or MRSA Information   _x___ TAKE THE FOLLOWING MEDICATION THE MORNING OF SURGERY WITH A SMALL SIP OF WATER. These include:  1. OMEPRAZOLE (PRILOSEC)  2. TAKE AN EXTRA OMEPRAZOLE THE NIGHT BEFORE YOUR SURGERY  3. YOU MAY TAKE XANAX (ALPRAZOLAM) AM OF SURGERY IF NEEDED  4.  5.  6.  ____Fleets enema or Magnesium Citrate as directed.   _x___ Use CHG Soap or sage wipes as directed on instruction sheet   _X___ Use inhalers on the day of surgery-USE YOUR ADVAIR AM OF SURGERY  ____ Stop Metformin and Janumet 2 days prior to surgery.    ____ Take 1/2 of usual  insulin dose the night before surgery and none on the morning surgery.   ____ Follow recommendations from Cardiologist, Pulmonologist or PCP regarding stopping Aspirin, Coumadin, Plavix ,Eliquis, Effient, or Pradaxa, and Pletal.  X____Stop Anti-inflammatories such as Advil, Aleve, Ibuprofen, Motrin, Naproxen, Naprosyn, Goodies powders, EXCEDRIN or aspirin products NOW-OK to take Tylenol OR TRAMADOL   ____ Stop supplements until after surgery.    _X___ Bring C-Pap to the hospital.

## 2019-08-02 ENCOUNTER — Other Ambulatory Visit
Admission: RE | Admit: 2019-08-02 | Discharge: 2019-08-02 | Disposition: A | Discharge status: Home / Self Care | Payer: Medicare HMO | Source: Ambulatory Visit | Attending: Surgery | Admitting: Surgery

## 2019-08-02 DIAGNOSIS — Z20828 Contact with and (suspected) exposure to other viral communicable diseases: Secondary | ICD-10-CM | POA: Insufficient documentation

## 2019-08-02 DIAGNOSIS — Z01812 Encounter for preprocedural laboratory examination: Secondary | ICD-10-CM | POA: Insufficient documentation

## 2019-08-02 DIAGNOSIS — M5126 Other intervertebral disc displacement, lumbar region: Secondary | ICD-10-CM | POA: Diagnosis not present

## 2019-08-02 LAB — SARS CORONAVIRUS 2 (TAT 6-24 HRS): SARS Coronavirus 2: NEGATIVE

## 2019-08-07 ENCOUNTER — Other Ambulatory Visit: Payer: Self-pay

## 2019-08-07 ENCOUNTER — Encounter
Admission: RE | Disposition: A | Discharge status: Home / Self Care | Payer: Self-pay | Source: Ambulatory Visit | Attending: Surgery

## 2019-08-07 ENCOUNTER — Encounter: Payer: Self-pay | Admitting: *Deleted

## 2019-08-07 ENCOUNTER — Ambulatory Visit: Payer: Medicare HMO

## 2019-08-07 ENCOUNTER — Ambulatory Visit: Payer: Medicare HMO | Admitting: Certified Registered Nurse Anesthetist

## 2019-08-07 ENCOUNTER — Ambulatory Visit
Admission: RE | Admit: 2019-08-07 | Discharge: 2019-08-07 | Disposition: A | Discharge status: Home / Self Care | Payer: Medicare HMO | Source: Ambulatory Visit | Attending: Surgery | Admitting: Surgery

## 2019-08-07 DIAGNOSIS — I252 Old myocardial infarction: Secondary | ICD-10-CM | POA: Insufficient documentation

## 2019-08-07 DIAGNOSIS — M7581 Other shoulder lesions, right shoulder: Secondary | ICD-10-CM | POA: Diagnosis not present

## 2019-08-07 DIAGNOSIS — K2100 Gastro-esophageal reflux disease with esophagitis, without bleeding: Secondary | ICD-10-CM | POA: Diagnosis not present

## 2019-08-07 DIAGNOSIS — J449 Chronic obstructive pulmonary disease, unspecified: Secondary | ICD-10-CM | POA: Diagnosis not present

## 2019-08-07 DIAGNOSIS — M75101 Unspecified rotator cuff tear or rupture of right shoulder, not specified as traumatic: Secondary | ICD-10-CM | POA: Diagnosis not present

## 2019-08-07 DIAGNOSIS — Z923 Personal history of irradiation: Secondary | ICD-10-CM | POA: Diagnosis not present

## 2019-08-07 DIAGNOSIS — M65811 Other synovitis and tenosynovitis, right shoulder: Secondary | ICD-10-CM | POA: Diagnosis not present

## 2019-08-07 DIAGNOSIS — F329 Major depressive disorder, single episode, unspecified: Secondary | ICD-10-CM | POA: Diagnosis not present

## 2019-08-07 DIAGNOSIS — S43431A Superior glenoid labrum lesion of right shoulder, initial encounter: Secondary | ICD-10-CM | POA: Diagnosis not present

## 2019-08-07 DIAGNOSIS — S46111A Strain of muscle, fascia and tendon of long head of biceps, right arm, initial encounter: Secondary | ICD-10-CM | POA: Insufficient documentation

## 2019-08-07 DIAGNOSIS — X58XXXA Exposure to other specified factors, initial encounter: Secondary | ICD-10-CM | POA: Diagnosis not present

## 2019-08-07 DIAGNOSIS — G4733 Obstructive sleep apnea (adult) (pediatric): Secondary | ICD-10-CM | POA: Insufficient documentation

## 2019-08-07 DIAGNOSIS — K219 Gastro-esophageal reflux disease without esophagitis: Secondary | ICD-10-CM | POA: Insufficient documentation

## 2019-08-07 DIAGNOSIS — Z79899 Other long term (current) drug therapy: Secondary | ICD-10-CM | POA: Insufficient documentation

## 2019-08-07 DIAGNOSIS — Z87891 Personal history of nicotine dependence: Secondary | ICD-10-CM | POA: Diagnosis not present

## 2019-08-07 DIAGNOSIS — I10 Essential (primary) hypertension: Secondary | ICD-10-CM | POA: Diagnosis not present

## 2019-08-07 DIAGNOSIS — M7541 Impingement syndrome of right shoulder: Secondary | ICD-10-CM | POA: Diagnosis not present

## 2019-08-07 DIAGNOSIS — M24111 Other articular cartilage disorders, right shoulder: Secondary | ICD-10-CM | POA: Diagnosis not present

## 2019-08-07 DIAGNOSIS — Z955 Presence of coronary angioplasty implant and graft: Secondary | ICD-10-CM | POA: Diagnosis not present

## 2019-08-07 DIAGNOSIS — M25811 Other specified joint disorders, right shoulder: Secondary | ICD-10-CM | POA: Insufficient documentation

## 2019-08-07 DIAGNOSIS — F419 Anxiety disorder, unspecified: Secondary | ICD-10-CM | POA: Insufficient documentation

## 2019-08-07 DIAGNOSIS — M75121 Complete rotator cuff tear or rupture of right shoulder, not specified as traumatic: Secondary | ICD-10-CM | POA: Diagnosis not present

## 2019-08-07 DIAGNOSIS — E785 Hyperlipidemia, unspecified: Secondary | ICD-10-CM | POA: Diagnosis not present

## 2019-08-07 DIAGNOSIS — M7521 Bicipital tendinitis, right shoulder: Secondary | ICD-10-CM | POA: Diagnosis not present

## 2019-08-07 DIAGNOSIS — Z79811 Long term (current) use of aromatase inhibitors: Secondary | ICD-10-CM | POA: Insufficient documentation

## 2019-08-07 DIAGNOSIS — E782 Mixed hyperlipidemia: Secondary | ICD-10-CM | POA: Diagnosis not present

## 2019-08-07 DIAGNOSIS — I2510 Atherosclerotic heart disease of native coronary artery without angina pectoris: Secondary | ICD-10-CM | POA: Insufficient documentation

## 2019-08-07 DIAGNOSIS — G4730 Sleep apnea, unspecified: Secondary | ICD-10-CM | POA: Diagnosis not present

## 2019-08-07 DIAGNOSIS — M25511 Pain in right shoulder: Secondary | ICD-10-CM | POA: Diagnosis not present

## 2019-08-07 DIAGNOSIS — G8918 Other acute postprocedural pain: Secondary | ICD-10-CM | POA: Diagnosis not present

## 2019-08-07 HISTORY — PX: SHOULDER ARTHROSCOPY WITH ROTATOR CUFF REPAIR AND OPEN BICEPS TENODESIS: SHX6677

## 2019-08-07 LAB — POCT I-STAT, CHEM 8
BUN: 11 mg/dL (ref 8–23)
Calcium, Ion: 1.07 mmol/L — ABNORMAL LOW (ref 1.15–1.40)
Chloride: 104 mmol/L (ref 98–111)
Creatinine, Ser: 0.6 mg/dL (ref 0.44–1.00)
Glucose, Bld: 111 mg/dL — ABNORMAL HIGH (ref 70–99)
HCT: 38 % (ref 36.0–46.0)
Hemoglobin: 12.9 g/dL (ref 12.0–15.0)
Potassium: 3.6 mmol/L (ref 3.5–5.1)
Sodium: 140 mmol/L (ref 135–145)
TCO2: 24 mmol/L (ref 22–32)

## 2019-08-07 SURGERY — SHOULDER ARTHROSCOPY WITH ROTATOR CUFF REPAIR AND OPEN BICEPS TENODESIS
Anesthesia: General | Site: Shoulder | Laterality: Right

## 2019-08-07 MED ORDER — ONDANSETRON HCL 4 MG/2ML IJ SOLN
INTRAMUSCULAR | Status: AC
  Filled 2019-08-07: qty 2

## 2019-08-07 MED ORDER — CEFAZOLIN SODIUM-DEXTROSE 2-4 GM/100ML-% IV SOLN
2.0000 g | INTRAVENOUS | Status: AC
  Administered 2019-08-07: 2 g via INTRAVENOUS

## 2019-08-07 MED ORDER — BUPIVACAINE LIPOSOME 1.3 % IJ SUSP
INTRAMUSCULAR | Status: AC
  Filled 2019-08-07: qty 20

## 2019-08-07 MED ORDER — ONDANSETRON HCL 4 MG/2ML IJ SOLN
INTRAMUSCULAR | Status: DC | PRN
  Administered 2019-08-07: 4 mg via INTRAVENOUS

## 2019-08-07 MED ORDER — CEFAZOLIN SODIUM-DEXTROSE 2-4 GM/100ML-% IV SOLN
INTRAVENOUS | Status: AC
  Filled 2019-08-07: qty 100

## 2019-08-07 MED ORDER — ROCURONIUM BROMIDE 100 MG/10ML IV SOLN
INTRAVENOUS | Status: DC | PRN
  Administered 2019-08-07: 50 mg via INTRAVENOUS
  Administered 2019-08-07: 10 mg via INTRAVENOUS

## 2019-08-07 MED ORDER — OXYCODONE HCL 5 MG PO TABS: 5.0000 mg | ORAL_TABLET | ORAL | 0 refills | Status: DC | PRN

## 2019-08-07 MED ORDER — BUPIVACAINE HCL (PF) 0.5 % IJ SOLN
INTRAMUSCULAR | Status: AC
  Filled 2019-08-07: qty 30

## 2019-08-07 MED ORDER — SUGAMMADEX SODIUM 200 MG/2ML IV SOLN
INTRAVENOUS | Status: AC
  Filled 2019-08-07: qty 2

## 2019-08-07 MED ORDER — LACTATED RINGERS IV SOLN: INTRAVENOUS | Status: DC

## 2019-08-07 MED ORDER — FENTANYL CITRATE (PF) 100 MCG/2ML IJ SOLN
INTRAMUSCULAR | Status: AC
  Filled 2019-08-07: qty 2

## 2019-08-07 MED ORDER — FENTANYL CITRATE (PF) 100 MCG/2ML IJ SOLN
50.0000 ug | INTRAMUSCULAR | Status: DC | PRN
  Administered 2019-08-07: 08:00:00 50 ug via INTRAVENOUS

## 2019-08-07 MED ORDER — SODIUM CHLORIDE 0.9 % IV SOLN
INTRAVENOUS | Status: DC | PRN
  Administered 2019-08-07: 75 ug/min via INTRAVENOUS

## 2019-08-07 MED ORDER — ACETAMINOPHEN 10 MG/ML IV SOLN
INTRAVENOUS | Status: DC | PRN
  Administered 2019-08-07: 1000 mg via INTRAVENOUS

## 2019-08-07 MED ORDER — LACTATED RINGERS IV SOLN
INTRAVENOUS | Status: DC | PRN
  Administered 2019-08-07: 3000 mL

## 2019-08-07 MED ORDER — SUGAMMADEX SODIUM 200 MG/2ML IV SOLN
INTRAVENOUS | Status: DC | PRN
  Administered 2019-08-07: 129.6 mg via INTRAVENOUS

## 2019-08-07 MED ORDER — GLYCOPYRROLATE 0.2 MG/ML IJ SOLN
INTRAMUSCULAR | Status: AC
  Filled 2019-08-07: qty 1

## 2019-08-07 MED ORDER — PROPOFOL 10 MG/ML IV BOLUS
INTRAVENOUS | Status: DC | PRN
  Administered 2019-08-07: 100 mg via INTRAVENOUS

## 2019-08-07 MED ORDER — CHLORHEXIDINE GLUCONATE 4 % EX LIQD: 60.0000 mL | Freq: Once | CUTANEOUS | Status: DC

## 2019-08-07 MED ORDER — PROPOFOL 10 MG/ML IV BOLUS
INTRAVENOUS | Status: AC
  Filled 2019-08-07: qty 20

## 2019-08-07 MED ORDER — BUPIVACAINE HCL (PF) 0.5 % IJ SOLN
INTRAMUSCULAR | Status: AC
  Filled 2019-08-07: qty 10

## 2019-08-07 MED ORDER — LIDOCAINE HCL (PF) 1 % IJ SOLN
INTRAMUSCULAR | Status: AC
  Filled 2019-08-07: qty 5

## 2019-08-07 MED ORDER — DEXAMETHASONE SODIUM PHOSPHATE 10 MG/ML IJ SOLN
INTRAMUSCULAR | Status: AC
  Filled 2019-08-07: qty 1

## 2019-08-07 MED ORDER — LACTATED RINGERS IV SOLN
INTRAVENOUS | Status: DC | PRN
  Administered 2019-08-07: 09:00:00 via INTRAVENOUS

## 2019-08-07 MED ORDER — GLYCOPYRROLATE 0.2 MG/ML IJ SOLN
INTRAMUSCULAR | Status: DC | PRN
  Administered 2019-08-07 (×2): 0.1 mg via INTRAVENOUS

## 2019-08-07 MED ORDER — EPINEPHRINE PF 1 MG/ML IJ SOLN
INTRAMUSCULAR | Status: AC
  Filled 2019-08-07: qty 3

## 2019-08-07 MED ORDER — LIDOCAINE HCL (CARDIAC) PF 100 MG/5ML IV SOSY
PREFILLED_SYRINGE | INTRAVENOUS | Status: DC | PRN
  Administered 2019-08-07: 80 mg via INTRAVENOUS

## 2019-08-07 MED ORDER — LIDOCAINE HCL (PF) 1 % IJ SOLN
INTRAMUSCULAR | Status: DC | PRN
  Administered 2019-08-07: 3 mL

## 2019-08-07 MED ORDER — DEXAMETHASONE SODIUM PHOSPHATE 10 MG/ML IJ SOLN
INTRAMUSCULAR | Status: DC | PRN
  Administered 2019-08-07: 5 mg via INTRAVENOUS

## 2019-08-07 MED ORDER — MIDAZOLAM HCL 2 MG/2ML IJ SOLN
INTRAMUSCULAR | Status: AC
  Filled 2019-08-07: qty 2

## 2019-08-07 MED ORDER — BUPIVACAINE HCL (PF) 0.5 % IJ SOLN
INTRAMUSCULAR | Status: DC | PRN
  Administered 2019-08-07: 10 mL via PERINEURAL

## 2019-08-07 MED ORDER — MIDAZOLAM HCL 2 MG/2ML IJ SOLN
INTRAMUSCULAR | Status: AC
  Administered 2019-08-07: 1 mg via INTRAVENOUS
  Filled 2019-08-07: qty 2

## 2019-08-07 MED ORDER — ONDANSETRON HCL 4 MG/2ML IJ SOLN: 4.0000 mg | Freq: Once | INTRAMUSCULAR | Status: DC | PRN

## 2019-08-07 MED ORDER — MIDAZOLAM HCL 2 MG/2ML IJ SOLN
1.0000 mg | INTRAMUSCULAR | Status: DC | PRN
  Administered 2019-08-07: 08:00:00 1 mg via INTRAVENOUS

## 2019-08-07 MED ORDER — BUPIVACAINE-EPINEPHRINE 0.5% -1:200000 IJ SOLN
INTRAMUSCULAR | Status: DC | PRN
  Administered 2019-08-07: 30 mL

## 2019-08-07 MED ORDER — ACETAMINOPHEN 10 MG/ML IV SOLN
INTRAVENOUS | Status: AC
  Filled 2019-08-07: qty 100

## 2019-08-07 MED ORDER — ROCURONIUM BROMIDE 50 MG/5ML IV SOLN
INTRAVENOUS | Status: AC
  Filled 2019-08-07: qty 1

## 2019-08-07 MED ORDER — FENTANYL CITRATE (PF) 100 MCG/2ML IJ SOLN: 25.0000 ug | INTRAMUSCULAR | Status: DC | PRN

## 2019-08-07 MED ORDER — BUPIVACAINE LIPOSOME 1.3 % IJ SUSP
INTRAMUSCULAR | Status: DC | PRN
  Administered 2019-08-07: 20 mL via PERINEURAL

## 2019-08-07 MED ORDER — FENTANYL CITRATE (PF) 100 MCG/2ML IJ SOLN
INTRAMUSCULAR | Status: AC
  Administered 2019-08-07: 50 ug via INTRAVENOUS
  Filled 2019-08-07: qty 2

## 2019-08-07 SURGICAL SUPPLY — 56 items
ANCHOR ALL-SUT Q-FIX 2.8 (Anchor) ×2 IMPLANT
ANCHOR BONE REGENETEN (Anchor) ×1 IMPLANT
ANCHOR HEALICOIL REGEN 5.5 (Anchor) ×1 IMPLANT
ANCHOR JUGGERKNOT WTAP NDL 2.9 (Anchor) ×1 IMPLANT
ANCHOR SUT W/ ORTHOCORD (Anchor) ×1 IMPLANT
ANCHOR TENDON REGENETEN (Staple) ×1 IMPLANT
BIT DRILL JUGRKNT W/NDL BIT2.9 (DRILL) IMPLANT
BLADE FULL RADIUS 3.5 (BLADE) ×2 IMPLANT
BUR ACROMIONIZER 4.0 (BURR) ×2 IMPLANT
CANNULA SHAVER 8MMX76MM (CANNULA) ×2 IMPLANT
CHLORAPREP W/TINT 26 (MISCELLANEOUS) ×2 IMPLANT
COVER MAYO STAND REUSABLE (DRAPES) ×2 IMPLANT
COVER WAND RF STERILE (DRAPES) ×2 IMPLANT
DRAPE IMP U-DRAPE 54X76 (DRAPES) ×4 IMPLANT
DRAPE SPLIT 6X30 W/TAPE (DRAPES) ×4 IMPLANT
DRILL JUGGERKNOT W/NDL BIT 2.9 (DRILL) ×2
ELECT CAUTERY BLADE TIP 2.5 (TIP) ×2
ELECT REM PT RETURN 9FT ADLT (ELECTROSURGICAL) ×2
ELECTRODE CAUTERY BLDE TIP 2.5 (TIP) ×1 IMPLANT
ELECTRODE REM PT RTRN 9FT ADLT (ELECTROSURGICAL) ×1 IMPLANT
GAUZE SPONGE 4X4 12PLY STRL (GAUZE/BANDAGES/DRESSINGS) ×2 IMPLANT
GAUZE XEROFORM 1X8 LF (GAUZE/BANDAGES/DRESSINGS) ×2 IMPLANT
GLOVE BIO SURGEON STRL SZ7.5 (GLOVE) ×4 IMPLANT
GLOVE BIO SURGEON STRL SZ8 (GLOVE) ×4 IMPLANT
GLOVE BIOGEL PI IND STRL 8 (GLOVE) ×1 IMPLANT
GLOVE BIOGEL PI INDICATOR 8 (GLOVE) ×1
GLOVE INDICATOR 8.0 STRL GRN (GLOVE) ×2 IMPLANT
GOWN STRL REUS W/ TWL LRG LVL3 (GOWN DISPOSABLE) ×1 IMPLANT
GOWN STRL REUS W/ TWL XL LVL3 (GOWN DISPOSABLE) ×1 IMPLANT
GOWN STRL REUS W/TWL LRG LVL3 (GOWN DISPOSABLE) ×1
GOWN STRL REUS W/TWL XL LVL3 (GOWN DISPOSABLE) ×1
GRASPER SUT 15 45D LOW PRO (SUTURE) ×1 IMPLANT
IMPL REGENETEN MEDIUM (Shoulder) IMPLANT
IMPLANT REGENETEN MEDIUM (Shoulder) ×2 IMPLANT
IV LACTATED RINGER IRRG 3000ML (IV SOLUTION) ×1
IV LR IRRIG 3000ML ARTHROMATIC (IV SOLUTION) ×2 IMPLANT
KIT CANNULA 8X76-LX IN CANNULA (CANNULA) IMPLANT
KIT SUTURE 2.8 Q-FIX DISP (MISCELLANEOUS) ×1 IMPLANT
MANIFOLD NEPTUNE II (INSTRUMENTS) ×2 IMPLANT
MASK FACE SPIDER DISP (MASK) ×2 IMPLANT
MAT ABSORB  FLUID 56X50 GRAY (MISCELLANEOUS) ×1
MAT ABSORB FLUID 56X50 GRAY (MISCELLANEOUS) ×1 IMPLANT
PACK ARTHROSCOPY SHOULDER (MISCELLANEOUS) ×2 IMPLANT
PASSER SUT FIRSTPASS SELF (INSTRUMENTS) ×1 IMPLANT
SLING ARM LRG DEEP (SOFTGOODS) ×2 IMPLANT
SLING ULTRA II LG (MISCELLANEOUS) ×2 IMPLANT
STAPLER SKIN PROX 35W (STAPLE) ×2 IMPLANT
STRAP SAFETY 5IN WIDE (MISCELLANEOUS) ×2 IMPLANT
SUT ETHIBOND 0 MO6 C/R (SUTURE) ×2 IMPLANT
SUT ULTRABRAID 2 COBRAID 38 (SUTURE) IMPLANT
SUT VIC AB 2-0 CT1 27 (SUTURE) ×2
SUT VIC AB 2-0 CT1 TAPERPNT 27 (SUTURE) ×2 IMPLANT
TAPE MICROFOAM 4IN (TAPE) ×2 IMPLANT
TUBING ARTHRO INFLOW-ONLY STRL (TUBING) ×2 IMPLANT
TUBING CONNECTING 10 (TUBING) ×2 IMPLANT
WAND WEREWOLF FLOW 90D (MISCELLANEOUS) ×2 IMPLANT

## 2019-08-07 NOTE — Discharge Instructions (Addendum)
AMBULATORY SURGERY  DISCHARGE INSTRUCTIONS   1) The drugs that you were given will stay in your system until tomorrow so for the next 24 hours you should not:  A) Drive an automobile B) Make any legal decisions C) Drink any alcoholic beverage   2) You may resume regular meals tomorrow.  Today it is better to start with liquids and gradually work up to solid foods.  You may eat anything you prefer, but it is better to start with liquids, then soup and crackers, and gradually work up to solid foods.   3) Please notify your doctor immediately if you have any unusual bleeding, trouble breathing, redness and pain at the surgery site, drainage, fever, or pain not relieved by medication.    4) Additional Instructions:        Please contact your physician with any problems or Same Day Surgery at [phone], Monday through Friday 6 am to 4 pm, or [place] [facility] number at [phone]         Interscalene Nerve Block with Exparel  1.  For your surgery you have received an Interscalene Nerve Block with Exparel. 2. Nerve Blocks affect many types of nerves, including nerves that control movement, pain and normal sensation.  You may experience feelings such as numbness, tingling, heaviness, weakness or the inability to move your arm or the feeling or sensation that your arm has "fallen asleep". 3. A nerve block with Exparel can last up to 5 days.  Usually the weakness wears off first.  The tingling and heaviness usually wear off next.  Finally you may start to notice pain.  Keep in mind that this may occur in any order.  Once a nerve block starts to wear off it is usually completely gone within 60 minutes. 4. ISNB may cause mild shortness of breath, a hoarse voice, blurry vision, unequal pupils, or drooping of the face on the same side as the nerve block.  These symptoms will usually resolve with the numbness.  Very rarely the procedure itself can cause mild seizures. 5. If  needed, your surgeon will give you a prescription for pain medication.  It will take about 60 minutes for the oral pain medication to become fully effective.  So, it is recommended that you start taking this medication before the nerve block first begins to wear off, or when you first begin to feel discomfort. 6. Take your pain medication only as prescribed.  Pain medication can cause sedation and decrease your breathing if you take more than you need for the level of pain that you have. 7. Nausea is a common side effect of many pain medications.  You may want to eat something before taking your pain medicine to prevent nausea. 8. After an Interscalene nerve block, you cannot feel pain, pressure or extremes in temperature in the effected arm.  Because your arm is numb it is at an increased risk for injury.  To decrease the possibility of injury, please practice the following:  a. While you are awake change the position of your arm frequently to prevent too much pressure on any one area for prolonged periods of time. b.  If you have a cast or tight dressing, check the color or your fingers every couple of hours.  Call your surgeon with the appearance of any discoloration (white or blue). c. If you are given a sling to wear before you go home, please wear it  at all times until the block has  completely worn off.  Do not get up at night without your sling. d. Please contact [facility] or your surgeon if you do not begin to regain sensation after 7 days from the surgery.  Anesthesia may be contacted by calling the Same Day Surgery Department, Mon. through Fri., 6 am to 4 pm at [phone].   e. If you experience any other problems or concerns, please contact your surgeon's office. If you experience severe or prolonged shortness of breath go to the nearest emergency department. .Orthopedic discharge instructions: Keep dressing dry and intact.  May shower after dressing changed on post-op day #4  (Sunday).  Cover staples with Band-Aids after drying off. Apply ice frequently to shoulder. Take ibuprofen 600-800 mg TID with meals for 7-10 days, then as necessary. Take oxycodone as prescribed when needed.  May supplement with ES Tylenol if necessary. Keep shoulder immobilizer on at all times except may remove for bathing purposes. Follow-up in 10-14 days or as scheduled.   Per Dr. Ola Spurr, please wear your CPAP during the day if napping. Continue to use incentive spirometer while awake.

## 2019-08-07 NOTE — Anesthesia Procedure Notes (Signed)
Anesthesia Regional Block: Interscalene brachial plexus block   Pre-Anesthetic Checklist: ,, timeout performed, Correct Patient, Correct Site, Correct Laterality, Correct Procedure, Correct Position, site marked, Risks and benefits discussed,  Surgical consent,  Pre-op evaluation,  At surgeon's request and post-op pain management  Laterality: Right  Prep: chloraprep       Needles:  Injection technique: Single-shot  Needle Type: Stimiplex     Needle Length: 10cm  Needle Gauge: 21     Additional Needles:   Procedures:,,,, ultrasound used (permanent image in chart),,,,  Narrative:  Start time: 08/07/2019 8:17 AM End time: 08/07/2019 8:21 AM Injection made incrementally with aspirations every 5 mL.  Performed by: Personally  Anesthesiologist: Emmie Niemann, MD  Additional Notes: Functioning IV was confirmed and monitors were applied.  A Stimuplex needle was used. Sterile prep and drape,hand hygiene and sterile gloves were used.  Negative aspiration and negative test dose prior to incremental administration of local anesthetic. The patient tolerated the procedure well.

## 2019-08-07 NOTE — H&P (Signed)
Paper H&P to be scanned into permanent record. H&P reviewed and patient re-examined. No changes. 

## 2019-08-07 NOTE — OR Nursing (Signed)
Patient able to move fingers on operative side, fingers warm cap refill brisk

## 2019-08-07 NOTE — Anesthesia Post-op Follow-up Note (Signed)
Anesthesia QCDR form completed.        

## 2019-08-07 NOTE — Transfer of Care (Signed)
Immediate Anesthesia Transfer of Care Note  Patient: Michelle Moore  Procedure(s) Performed: SHOULDER ARTHROSCOPY WITH DEBRIDEMENT, DECOMPRESSION, ROTATOR CUFF REPAIR AND BICEPS TENODESIS. - RNFA (Right Shoulder)  Patient Location: PACU  Anesthesia Type:General  Level of Consciousness: awake, alert  and oriented  Airway & Oxygen Therapy: Patient Spontanous Breathing and Patient connected to face mask oxygen  Post-op Assessment: Report given to RN and Post -op Vital signs reviewed and stable  Post vital signs: Reviewed and stable  Last Vitals:  Vitals Value Taken Time  BP 134/68 08/07/19 1052  Temp    Pulse 93 08/07/19 1053  Resp 23 08/07/19 1053  SpO2 97 % 08/07/19 1053  Vitals shown include unvalidated device data.  Last Pain:  Vitals:   08/07/19 0830  TempSrc:   PainSc: Asleep         Complications: No apparent anesthesia complications

## 2019-08-07 NOTE — Anesthesia Procedure Notes (Signed)
Procedure Name: Intubation Performed by: Demetrius Charity, CRNA Pre-anesthesia Checklist: Patient identified, Patient being monitored, Timeout performed, Emergency Drugs available and Suction available Patient Re-evaluated:Patient Re-evaluated prior to induction Oxygen Delivery Method: Circle system utilized Preoxygenation: Pre-oxygenation with 100% oxygen Induction Type: IV induction Ventilation: Mask ventilation without difficulty Laryngoscope Size: 3 and McGraph Grade View: Grade I Tube type: Oral Tube size: 7.0 mm Number of attempts: 1 Airway Equipment and Method: Stylet Placement Confirmation: ETT inserted through vocal cords under direct vision,  positive ETCO2 and breath sounds checked- equal and bilateral Secured at: 21 cm Tube secured with: Tape Dental Injury: Teeth and Oropharynx as per pre-operative assessment

## 2019-08-07 NOTE — Op Note (Signed)
08/07/2019  10:46 AM  Patient:   Michelle Moore  Pre-Op Diagnosis:   Impingement/tendinopathy with rotator cuff tear and biceps tendinopathy, right shoulder.  Post-Op Diagnosis:   Impingement/tendinopathy with full-thickness rotator cuff tear, degenerative labral fraying, and biceps tendinopathy, right shoulder.  Procedure:   Limited arthroscopic debridement, arthroscopic repair of partial-thickness subscapularis tendon tear, arthroscopic subacromial decompression, mini-open repair of full-thickness supraspinatus tear using a Smith & Nephew Regeneten patch, and mini-open biceps tenodesis, right shoulder.  Anesthesia:   General endotracheal with interscalene block using Exparel placed preoperatively by the anesthesiologist.  Surgeon:   Pascal Lux, MD  Assistant:   None  Findings:   As above.  The articular-sided superior insertional fibers of the subscapularis tendon were torn, involving approximately 10 to 15% of the footprint.  The supraspinatus tear involved the anterior and middle insertional fibers and measured approximately 1.5 x 1.2 cm.  The remainder of the rotator cuff was in satisfactory condition.  There was extensive partial-thickness tearing of the biceps tendon.  There was moderate labral fraying superiorly and posterosuperiorly without frank detachment from the glenoid.  The glenoid and humeral articular surfaces both were in satisfactory condition.  Complications:   None  Fluids:   700 cc  Estimated blood loss:   15 cc  Tourniquet time:   None  Drains:   None  Closure:   Staples      Brief clinical note:   The patient is a 63 year old female with a history of progressively worsening right shoulder pain. The patient's symptoms have progressed despite medications, activity modification, etc. The patient's history and examination are consistent with impingement/tendinopathy with a rotator cuff tear. These findings were confirmed by MRI scan. The patient presents at  this time for definitive management of these shoulder symptoms.  Procedure:   The patient underwent placement of an interscalene block using Exparel by the anesthesiologist in the preoperative holding area before being brought into the operating room and lain in the supine position. The patient then underwent general endotracheal intubation and anesthesia before being repositioned in the beach chair position using the beach chair positioner. The right shoulder and upper extremity were prepped with ChloraPrep solution before being draped sterilely. Preoperative antibiotics were administered. A timeout was performed to confirm the proper surgical site before the expected portal sites and incision site were injected with 0.5% Sensorcaine with epinephrine. A posterior portal was created and the glenohumeral joint thoroughly inspected with the findings as described above. An anterior portal was created using an outside-in technique. The labrum and rotator cuff were further probed, again confirming the above-noted findings. The areas of degenerative labral fraying were debrided back to stable margins, as were areas of synovitis. The torn margins of the rotator cuff also were debrided using the full-radius resector. The ArthroCare wand was inserted and used to obtain hemostasis as well as to "anneal" the labrum superiorly and anteriorly.   A separate superolateral portal site was created using an outside in technique and used as a working portal to prepare the superior portion of the subscapularis tendon. The exposed lesser tuberosity was roughened with a full-radius resector down to bleeding bone before the tendon was repaired using a single Mitek BioKnotless anchor placed through the anterior portal. This repair was assessed and found to be stable both by probing and with passive external rotation of the arm. The instruments were removed from the joint after suctioning the excess fluid.  The camera was repositioned  through the posterior portal into  the subacromial space. A separate lateral portal was created using an outside-in technique. The 3.5 mm full-radius resector was introduced and used to perform a subtotal bursectomy. The ArthroCare wand was then inserted and used to remove the periosteal tissue off the undersurface of the anterior third of the acromion as well as to recess the coracoacromial ligament from its attachment along the anterior and lateral margins of the acromion. The 4.0 mm acromionizing bur was introduced and used to complete the decompression by removing the undersurface of the anterior third of the acromion. The full radius resector was reintroduced to remove any residual bony debris before the ArthroCare wand was reintroduced to obtain hemostasis. The instruments were then removed from the subacromial space after suctioning the excess fluid.  An approximately 4-5 cm incision was made over the anterolateral aspect of the shoulder beginning at the anterolateral corner of the acromion and extending distally in line with the bicipital groove. This incision was carried down through the subcutaneous tissues to expose the deltoid fascia. The raphae between the anterior and middle thirds was identified and this plane developed to provide access into the subacromial space. Additional bursal tissues were debrided sharply using Metzenbaum scissors. The rotator cuff tear was readily identified. The margins were debrided sharply with a #15 blade and the exposed greater tuberosity roughened with a rongeur. The tear was repaired using a single Smith & Nephew 2.8 mm Q-Fix anchor. These sutures were then brought back laterally and secured using a single Prosser knotless Regenasorb anchor to create a two-layer closure. In addition, a single #0 Ethibond interrupted suture was placed through the anterior portion of the tear in a side-to-side fashion to complete the closure. An apparent watertight  closure was obtained.  The bicipital groove was identified by palpation and opened for 1-1.5 cm. The biceps tendon stump was retrieved through this defect. The floor of the bicipital groove was roughened with a curet before a single Biomet 2.9 mm JuggerKnot anchor was inserted. Both sets of sutures were passed through the biceps tendon and tied securely to effect the tenodesis. The bicipital sheath was reapproximated using two #0 Ethibond interrupted sutures, incorporating the biceps tendon to further reinforce the tenodesis.  The wound was copiously irrigated with sterile saline solution before the deltoid raphae was reapproximated using 2-0 Vicryl interrupted sutures. The subcutaneous tissues were closed in two layers using 2-0 Vicryl interrupted sutures before the skin was closed using staples. The portal sites also were closed using staples. A sterile bulky dressing was applied to the shoulder before the arm was placed into a shoulder immobilizer. The patient was then awakened, extubated, and returned to the recovery room in satisfactory condition after tolerating the procedure well.

## 2019-08-07 NOTE — Anesthesia Preprocedure Evaluation (Addendum)
Anesthesia Evaluation  Patient identified by MRN, date of birth, ID band Patient awake    Reviewed: Allergy & Precautions, NPO status , Patient's Chart, lab work & pertinent test results  History of Anesthesia Complications Negative for: history of anesthetic complications  Airway Mallampati: III  TM Distance: >3 FB Neck ROM: Full    Dental no notable dental hx.    Pulmonary sleep apnea and Continuous Positive Airway Pressure Ventilation , COPD,  COPD inhaler, former smoker,    breath sounds clear to auscultation- rhonchi (-) wheezing      Cardiovascular hypertension, Pt. on medications (-) angina+ CAD, + Past MI and + Cardiac Stents  (-) CABG  Rhythm:Regular Rate:Normal - Systolic murmurs and - Diastolic murmurs    Neuro/Psych  Headaches, neg Seizures PSYCHIATRIC DISORDERS Anxiety    GI/Hepatic Neg liver ROS, GERD  ,  Endo/Other  negative endocrine ROSneg diabetes  Renal/GU negative Renal ROS     Musculoskeletal  (+) Arthritis ,   Abdominal (+) - obese,   Peds  Hematology negative hematology ROS (+)   Anesthesia Other Findings Past Medical History: No date: Anxiety No date: Arthritis No date: Cancer ([place])     Comment:  breast-right  No date: COPD (chronic obstructive pulmonary disease) ([facility]) No date: Dyspnea     Comment:  ONLY WITH EXERTION No date: GERD (gastroesophageal reflux disease)     Comment:  "sometimes" No date: Headache(784.0)     Comment:  sinus headaches No date: Myocardial infarction ([place])     Comment:  in 2011  no damage-1 stent No date: Personal history of radiation therapy 2014: Seizures ([place])     Comment:  X1-no idea what caused her seizure No date: Sleep apnea     Comment:  USES CPAP   Reproductive/Obstetrics                             Anesthesia Physical Anesthesia Plan  ASA: III  Anesthesia Plan: General   Post-op Pain Management:  Regional for  Post-op pain   Induction: Intravenous  PONV Risk Score and Plan: 2 and Ondansetron, Dexamethasone and Midazolam  Airway Management Planned: Oral ETT  Additional Equipment:   Intra-op Plan:   Post-operative Plan: Extubation in OR  Informed Consent: I have reviewed the patients History and Physical, chart, labs and discussed the procedure including the risks, benefits and alternatives for the proposed anesthesia with the patient or authorized representative who has indicated his/her understanding and acceptance.     Dental advisory given  Plan Discussed with: CRNA and Anesthesiologist  Anesthesia Plan Comments: (Discussed importance of wearing CPAP consistently while PNB is working)       Anesthesia Quick Evaluation

## 2019-08-07 NOTE — Progress Notes (Addendum)
Pt 92%-94% oxygen on RA with brief dips in oxygen level to upper 80s when dozing. Pt using incentive spirometer; sitting in chair. Dr. Ola Spurr at bedside to evaluate pt. Per Dr. Ola Spurr, pt my discharge home as long as room air oxygen at least 90%. Ok if oxygen dips as long as not sustained. MD reviewed with patient importance of using incentive spirometer at home every half hour and to sleep with CPAP even when napping during the day.  Pt verbalized understanding.

## 2019-08-07 NOTE — Progress Notes (Signed)
Lungs diminished sat on room air 88  Pt is sleepy  Encouraging deep breathing and incentive spiratory

## 2019-08-07 NOTE — Anesthesia Postprocedure Evaluation (Signed)
Anesthesia Post Note  Patient: Michelle Moore  Procedure(s) Performed: SHOULDER ARTHROSCOPY WITH DEBRIDEMENT, DECOMPRESSION, ROTATOR CUFF REPAIR AND BICEPS TENODESIS. - RNFA (Right Shoulder)  Patient location during evaluation: PACU Anesthesia Type: General Level of consciousness: awake and alert Pain management: pain level controlled Vital Signs Assessment: post-procedure vital signs reviewed and stable Respiratory status: spontaneous breathing, nonlabored ventilation and respiratory function stable Cardiovascular status: blood pressure returned to baseline and stable Postop Assessment: no apparent nausea or vomiting Anesthetic complications: no   Pt has known COPD and OSA, received interscalene block pre-op. Pt breathing comfortably, SpO2 91-95% with occasional temporary dips to 89% then back up above 90%.  Not far off from pre-op baseline of 94%.  Advised to use IS frequently at home and to wear CPAP any time she is sleeping or taking naps.  Pt expressed understanding and is in agreement with the plan.  RN to communicate plan to her husband.  Last Vitals:   Vitals:   08/07/19 1308 08/07/19 1322  BP:  120/69  Pulse: 61 63  Resp: 10 (!) 21  Temp:  36.7 C  SpO2: 94% 92%    Last Pain:  Vitals:   08/07/19 1322  TempSrc:   PainSc: 0-No pain                 Durenda Hurt

## 2019-08-08 ENCOUNTER — Encounter: Payer: Self-pay | Admitting: Surgery

## 2019-08-12 ENCOUNTER — Telehealth: Payer: Self-pay | Admitting: *Deleted

## 2019-08-12 NOTE — Telephone Encounter (Signed)
Patient had left voice mail over the weekend stating that she was in pain and could not  Reach Dr. Roland Rack.  Returned call to patient.  She states that she was not able to take the oxycodone "it just gave me a headache and didn't help the pain".  She states that she is doing okay now, just taking extra strength tylenol.

## 2019-08-16 DIAGNOSIS — M24111 Other articular cartilage disorders, right shoulder: Secondary | ICD-10-CM | POA: Diagnosis not present

## 2019-08-16 DIAGNOSIS — M25611 Stiffness of right shoulder, not elsewhere classified: Secondary | ICD-10-CM | POA: Diagnosis not present

## 2019-08-16 DIAGNOSIS — M7521 Bicipital tendinitis, right shoulder: Secondary | ICD-10-CM | POA: Diagnosis not present

## 2019-08-16 DIAGNOSIS — Z9889 Other specified postprocedural states: Secondary | ICD-10-CM | POA: Diagnosis not present

## 2019-08-16 DIAGNOSIS — M25511 Pain in right shoulder: Secondary | ICD-10-CM | POA: Diagnosis not present

## 2019-08-16 DIAGNOSIS — M6281 Muscle weakness (generalized): Secondary | ICD-10-CM | POA: Diagnosis not present

## 2019-08-18 NOTE — Progress Notes (Signed)
[facility]  [phone] [phone]  HEMATOLOGY-ONCOLOGY TELEMEDICINE VISIT PROGRESS NOTE  ID: Michelle Moore OB: [birth year]  MR#: [id number]  OAC#:[id number]  Patient Care Team: Michelle Guise, MD as PCP - General (Internal Medicine)  I connected with Michelle Moore on 08/20/19 at  2:45 PM EST by video enabled telemedicine visit and verified that I am speaking with the correct person using two identifiers.   I discussed the limitations, risks, security and privacy concerns of performing an evaluation and management service by telemedicine and the availability of in-person appointments. I also discussed with the patient that there may be a patient responsible charge related to this service. The patient expressed understanding and agreed to proceed.   Other persons participating in the visit and their role in the encounter: Patient, MD  Patient's location: Home Provider's location: Clinic  CHIEF COMPLAINT: DCIS, right breast.  INTERVAL HISTORY: Patient agreed to video enabled telemedicine visit for routine evaluation.  She was having significant myalgias and joint pain several months ago and was found to have mild hypokalemia.  Patient was initiated on potassium supplementation, but she also discontinued her anastrozole at this time.  Her symptoms have now resolved and she is back to her baseline. She has no neurologic complaints.  She denies any recent fevers or illnesses.  She has a good appetite and denies weight loss.  She denies any chest pain, shortness of breath, cough, or hemoptysis.  She denies any nausea, vomiting, constipation, or diarrhea.  She has no urinary complaints.  Patient offers no specific complaints today.  REVIEW OF SYSTEMS:   Review of Systems  Constitutional: Negative.  Negative for fever, malaise/fatigue and weight loss.  Respiratory: Negative.  Negative for cough, hemoptysis and shortness of breath.   Cardiovascular: Negative.   Negative for chest pain and leg swelling.  Gastrointestinal: Negative.  Negative for abdominal pain.  Genitourinary: Negative.  Negative for dysuria.  Musculoskeletal: Negative.  Negative for back pain and myalgias.  Skin: Negative.  Negative for rash.  Neurological: Negative.  Negative for dizziness, sensory change, focal weakness and weakness.  Psychiatric/Behavioral: Negative.  The patient is not nervous/anxious.     As per HPI. Otherwise, a complete review of systems is negative.  PAST MEDICAL HISTORY: Past Medical History:  Diagnosis Date  . Anxiety   . Arthritis   . Cancer ([place])    breast-right   . COPD (chronic obstructive pulmonary disease) ([place])   . Dyspnea    ONLY WITH EXERTION  . GERD (gastroesophageal reflux disease)    "sometimes"  . Headache(784.0)    sinus headaches  . Myocardial infarction ([place])    in 2011  no damage-1 stent  . Personal history of radiation therapy   . Seizures ([place]) 2014   X1-no idea what caused her seizure  . Sleep apnea    USES CPAP    PAST SURGICAL HISTORY: Past Surgical History:  Procedure Laterality Date  . ABDOMINAL HYSTERECTOMY    . ANTERIOR CERVICAL DECOMP/DISCECTOMY FUSION N/A 01/20/2014   Procedure: ANTERIOR CERVICAL DECOMPRESSION/DISCECTOMY FUSION 2 LEVELS cervical five/six six Michelle Moore;  Surgeon: Michelle Charter, MD;  Location: [facility];  Service: Neurosurgery;  Laterality: N/A;  . BACK SURGERY     lumbar  . 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 BREAST BIOPSY  01/16/2019   coil, pending path  . BREAST LUMPECTOMY Right 03/07/2018   Procedure: BREAST LUMPECTOMY/ RE EXCISION;  Surgeon: Michelle Pun, MD;  Location: [facility];  Service: General;  Laterality: Right;  . CARDIAC CATHETERIZATION     2011  . CORONARY ANGIOPLASTY WITH STENT PLACEMENT     PLACED IN RCA 2011  . EYE SURGERY     lasik  . PARTIAL MASTECTOMY WITH NEEDLE LOCALIZATION Right 02/21/2018   Procedure: PARTIAL MASTECTOMY WITH NEEDLE  LOCALIZATION;  Surgeon: Michelle Pun, MD;  Location: [facility] OR[facility];  Service: General;  Laterality: Right;  . ROTATOR CUFF REPAIR     left shoulder  . SHOULDER ARTHROSCOPY WITH ROTATOR CUFF REPAIR AND OPEN BICEPS TENODESIS Right 08/07/2019   Procedure: SHOULDER ARTHROSCOPY WITH DEBRIDEMENT, DECOMPRESSION, ROTATOR CUFF REPAIR AND BICEPS TENODESIS. - RNFA;  Surgeon: Michelle Mull, MD;  Location: [facility];  Service: Orthopedics;  Laterality: Right;  . TOE SURGERY Right    has pin and plate in it    FAMILY HISTORY: Family History  Problem Relation Age of Onset  . Breast cancer Cousin 19       bilateral at 65 and 38; daughter of maternal aunt who was unaffected  . Lung cancer Maternal Aunt        dx 42s; deceased 51s; smoker  . Heart attack Father        deceased 42  . Stomach cancer Maternal Grandmother     ADVANCED DIRECTIVES (Y/N):  N  HEALTH MAINTENANCE: Social History   Tobacco Use  . Smoking status: Former Smoker    Packs/day: 1.00    Years: 30.00    Pack years: 30.00    Types: Cigarettes    Quit date: 09/13/2015    Years since quitting: 3.9  . Smokeless tobacco: Never Used  Substance Use Topics  . Alcohol use: Yes    Comment: occasional  . Drug use: No     Colonoscopy:  PAP:  Bone density:  Lipid panel:  Allergies  Allergen Reactions  . Other     BANDAIDS-OF LEFT ON FOR AN EXTENDED PERIOD OF TIME    Current Outpatient Medications  Medication Sig Dispense Refill  . ALPRAZolam (XANAX) 0.25 MG tablet Take 1 tablet (0.25 mg total) by mouth 2 (two) times daily as needed for anxiety. 120 tablet 3  . Aspirin-Acetaminophen-Caffeine (EXCEDRIN EXTRA STRENGTH PO) Take 2 tablets by mouth 2 (two) times daily as needed (for arm and neck pain).     . cetirizine (ZYRTEC) 10 MG tablet Take 10 mg by mouth daily as needed for allergies.     . citalopram (CELEXA) 40 MG tablet Take 1 tablet (40 mg total) by mouth at bedtime. 90 tablet 3  . fluticasone (FLONASE) 50 MCG/ACT  nasal spray Place 2 sprays into both nostrils daily as needed for allergies.     . Fluticasone-Salmeterol (ADVAIR) 250-50 MCG/DOSE AEPB Inhale 1 puff into the lungs 2 (two) times daily. 60 each 3  . folic acid (FOLVITE) 256 MCG tablet Take 1,600 mcg by mouth daily.    . hydrochlorothiazide (HYDRODIURIL) 25 MG tablet Take 25 mg by mouth daily. Pt takes for her ear due to hearing loss-not htn    . methotrexate (RHEUMATREX) 2.5 MG tablet Take 20 mg by mouth every Wednesday.     . montelukast (SINGULAIR) 10 MG tablet Take 1 tablet (10 mg total) by mouth at bedtime. 90 tablet 3  . Olopatadine HCl 0.2 % SOLN Place 1 drop into both eyes daily as needed (allergies).    . Omeprazole 20 MG TBEC Take 20 mg by mouth every morning.     Marland Kitchen oxyCODONE (  ROXICODONE) 5 MG immediate release tablet Take 1-2 tablets (5-10 mg total) by mouth every 4 (four) hours as needed for moderate pain or severe pain. 50 tablet 0  . potassium chloride (K-DUR) 10 MEQ tablet Take 1 tablet (10 mEq total) by mouth daily. 30 tablet 3  . simvastatin (ZOCOR) 40 MG tablet Take 1 tablet (40 mg total) by mouth at bedtime. 90 tablet 3   No current facility-administered medications for this visit.     OBJECTIVE: There were no vitals filed for this visit.   There is no height or weight on file to calculate BMI.    ECOG FS:0 - Asymptomatic  General: Well-developed, well-nourished, no acute distress. HEENT: Normocephalic Neuro: Alert, answering all questions appropriately. Cranial nerves grossly intact. Psych: Normal affect.  LAB RESULTS:  Lab Results  Component Value Date   NA 140 08/07/2019   K 3.6 08/07/2019   CL 104 08/07/2019   CO2 26 05/24/2019   GLUCOSE 111 (H) 08/07/2019   BUN 11 08/07/2019   CREATININE 0.60 08/07/2019   CALCIUM 8.9 05/24/2019   PROT 6.6 05/24/2019   ALBUMIN 4.3 05/24/2019   AST 17 05/24/2019   ALT 14 05/24/2019   ALKPHOS 80 05/24/2019   BILITOT 0.3 05/24/2019   GFRNONAA 78 05/24/2019   GFRAA 90  05/24/2019    Lab Results  Component Value Date   WBC 7.7 05/24/2019   NEUTROABS 13.6 (H) 02/14/2018   HGB 12.9 08/07/2019   HCT 38.0 08/07/2019   MCV 94 05/24/2019   PLT 223 05/24/2019     STUDIES: [place] Or Nerve Block-image Only ([facility])  Result Date: 08/07/2019 There is no interpretation for this exam.  This order is for images obtained during a surgical procedure.  Please See "Surgeries" Tab for more information regarding the procedure.    ASSESSMENT: DCIS, right breast.  PLAN:    1. DCIS, right breast:Patient underwent lumpectomy followed by adjuvant XRT which she completed in approximately September 2019. Because there was no invasive component on her pathology, she did not require adjuvant chemotherapy.    Patient could not tolerate tamoxifen or letrozole secondary to worsening joint pain.  She initially was tolerating anastrozole without significant side effects, but discontinued approximately 2 months ago when she is having myalgias and joint pain.  She agreed that her symptoms may have been related to her hypokalemia which is now resolved and is agreed to reinitiate anastrozole. Continue treatment for a total of 5 years completing treatment in September 2024.  If patient's symptoms return and she discontinue treatment again, she has been instructed to call clinic.  If treatment is discontinued, we will not attempt any other aromatase inhibitor.  Her most recent mammogram on January 02, 2019 revealed calcifications, but subsequent biopsy on Jan 16, 2019 was reported as benign without evidence of malignancy.  Repeat mammogram in April 2021.  Patient will have video assisted telemedicine visit in 6 months for routine evaluation. 2.  Osteopenia: Patient had a bone mineral density on November 01, 2018 which reported T score of -1.4.  Continue calcium and vitamin D supplementation.  Repeat in February 2021. 3.  Hypokalemia: Resolved.  I discussed the assessment and treatment plan with the  patient. The patient was provided an opportunity to ask questions and all were answered. The patient agreed with the plan and demonstrated an understanding of the instructions.   The patient was advised to call back or seek an in-person evaluation if the symptoms worsen or if the condition fails  to improve as anticipated.  I provided 25 minutes of face-to-face video visit time during this encounter, and > 50% was spent counseling as documented under my assessment & plan.    Lloyd Huger, MD 08/20/2019 6:59 AM  Cancer Staging Ductal carcinoma in situ (DCIS) of right breast Staging form: Breast, AJCC 8th Edition - Clinical: Stage 0 (cTis (DCIS), cN0, cM0, ER+, PR-, HER2-) - Signed by Lloyd Huger, MD on 03/18/2018

## 2019-08-19 ENCOUNTER — Inpatient Hospital Stay: Payer: Medicare HMO | Attending: Oncology | Admitting: Oncology

## 2019-08-19 DIAGNOSIS — D0511 Intraductal carcinoma in situ of right breast: Secondary | ICD-10-CM

## 2019-08-20 ENCOUNTER — Other Ambulatory Visit: Payer: Self-pay | Admitting: *Deleted

## 2019-08-20 DIAGNOSIS — Z9889 Other specified postprocedural states: Secondary | ICD-10-CM | POA: Diagnosis not present

## 2019-08-20 DIAGNOSIS — M25611 Stiffness of right shoulder, not elsewhere classified: Secondary | ICD-10-CM | POA: Diagnosis not present

## 2019-08-20 DIAGNOSIS — M6281 Muscle weakness (generalized): Secondary | ICD-10-CM | POA: Diagnosis not present

## 2019-08-20 DIAGNOSIS — M24111 Other articular cartilage disorders, right shoulder: Secondary | ICD-10-CM | POA: Diagnosis not present

## 2019-08-20 DIAGNOSIS — M7521 Bicipital tendinitis, right shoulder: Secondary | ICD-10-CM | POA: Diagnosis not present

## 2019-08-20 DIAGNOSIS — M75121 Complete rotator cuff tear or rupture of right shoulder, not specified as traumatic: Secondary | ICD-10-CM | POA: Diagnosis not present

## 2019-08-20 DIAGNOSIS — M25511 Pain in right shoulder: Secondary | ICD-10-CM | POA: Diagnosis not present

## 2019-08-20 MED ORDER — ANASTROZOLE 1 MG PO TABS: 1.0000 mg | ORAL_TABLET | Freq: Every day | ORAL | 1 refills | Status: DC

## 2019-08-27 DIAGNOSIS — M24111 Other articular cartilage disorders, right shoulder: Secondary | ICD-10-CM | POA: Diagnosis not present

## 2019-08-27 DIAGNOSIS — M75121 Complete rotator cuff tear or rupture of right shoulder, not specified as traumatic: Secondary | ICD-10-CM | POA: Diagnosis not present

## 2019-08-27 DIAGNOSIS — M25611 Stiffness of right shoulder, not elsewhere classified: Secondary | ICD-10-CM | POA: Diagnosis not present

## 2019-08-27 DIAGNOSIS — M6281 Muscle weakness (generalized): Secondary | ICD-10-CM | POA: Diagnosis not present

## 2019-08-27 DIAGNOSIS — Z9889 Other specified postprocedural states: Secondary | ICD-10-CM | POA: Diagnosis not present

## 2019-08-27 DIAGNOSIS — M25511 Pain in right shoulder: Secondary | ICD-10-CM | POA: Diagnosis not present

## 2019-08-27 DIAGNOSIS — M7521 Bicipital tendinitis, right shoulder: Secondary | ICD-10-CM | POA: Diagnosis not present

## 2019-09-03 DIAGNOSIS — M25511 Pain in right shoulder: Secondary | ICD-10-CM | POA: Diagnosis not present

## 2019-09-03 DIAGNOSIS — M25611 Stiffness of right shoulder, not elsewhere classified: Secondary | ICD-10-CM | POA: Diagnosis not present

## 2019-09-03 DIAGNOSIS — M75121 Complete rotator cuff tear or rupture of right shoulder, not specified as traumatic: Secondary | ICD-10-CM | POA: Diagnosis not present

## 2019-09-03 DIAGNOSIS — Z9889 Other specified postprocedural states: Secondary | ICD-10-CM | POA: Diagnosis not present

## 2019-09-03 DIAGNOSIS — M6281 Muscle weakness (generalized): Secondary | ICD-10-CM | POA: Diagnosis not present

## 2019-09-03 DIAGNOSIS — M7521 Bicipital tendinitis, right shoulder: Secondary | ICD-10-CM | POA: Diagnosis not present

## 2019-09-03 DIAGNOSIS — M24111 Other articular cartilage disorders, right shoulder: Secondary | ICD-10-CM | POA: Diagnosis not present

## 2019-09-09 ENCOUNTER — Other Ambulatory Visit: Payer: Self-pay

## 2019-09-09 MED ORDER — FLUTICASONE PROPIONATE 50 MCG/ACT NA SUSP: 2.0000 | Freq: Every day | NASAL | 1 refills | Status: AC | PRN

## 2019-09-12 ENCOUNTER — Other Ambulatory Visit: Payer: Self-pay | Admitting: Nurse Practitioner

## 2019-09-12 DIAGNOSIS — M25611 Stiffness of right shoulder, not elsewhere classified: Secondary | ICD-10-CM | POA: Diagnosis not present

## 2019-09-12 DIAGNOSIS — M25511 Pain in right shoulder: Secondary | ICD-10-CM | POA: Diagnosis not present

## 2019-09-12 DIAGNOSIS — E876 Hypokalemia: Secondary | ICD-10-CM | POA: Diagnosis not present

## 2019-09-12 DIAGNOSIS — M6281 Muscle weakness (generalized): Secondary | ICD-10-CM | POA: Diagnosis not present

## 2019-09-12 DIAGNOSIS — Z9889 Other specified postprocedural states: Secondary | ICD-10-CM | POA: Diagnosis not present

## 2019-09-13 LAB — BASIC METABOLIC PANEL
BUN/Creatinine Ratio: 16 (ref 12–28)
BUN: 11 mg/dL (ref 8–27)
CO2: 21 mmol/L (ref 20–29)
Calcium: 9.1 mg/dL (ref 8.7–10.3)
Chloride: 106 mmol/L (ref 96–106)
Creatinine, Ser: 0.69 mg/dL (ref 0.57–1.00)
GFR calc Af Amer: 108 mL/min/{1.73_m2} (ref >59)
GFR calc non Af Amer: 94 mL/min/{1.73_m2} (ref >59)
Glucose: 99 mg/dL (ref 65–99)
Potassium: 3.9 mmol/L (ref 3.5–5.2)
Sodium: 146 mmol/L — ABNORMAL HIGH (ref 134–144)

## 2019-09-15 NOTE — Progress Notes (Signed)
Please let the patient know that lab looks good. Thanks

## 2019-09-16 ENCOUNTER — Telehealth: Payer: Self-pay

## 2019-09-16 DIAGNOSIS — M25611 Stiffness of right shoulder, not elsewhere classified: Secondary | ICD-10-CM | POA: Diagnosis not present

## 2019-09-16 DIAGNOSIS — Z9889 Other specified postprocedural states: Secondary | ICD-10-CM | POA: Diagnosis not present

## 2019-09-16 DIAGNOSIS — M24111 Other articular cartilage disorders, right shoulder: Secondary | ICD-10-CM | POA: Diagnosis not present

## 2019-09-16 DIAGNOSIS — M25511 Pain in right shoulder: Secondary | ICD-10-CM | POA: Diagnosis not present

## 2019-09-16 DIAGNOSIS — M75121 Complete rotator cuff tear or rupture of right shoulder, not specified as traumatic: Secondary | ICD-10-CM | POA: Diagnosis not present

## 2019-09-16 DIAGNOSIS — M7521 Bicipital tendinitis, right shoulder: Secondary | ICD-10-CM | POA: Diagnosis not present

## 2019-09-16 NOTE — Telephone Encounter (Signed)
Pt was notified her labs are normal.

## 2019-09-16 NOTE — Telephone Encounter (Signed)
-----   Message from Ronnell Freshwater, NP sent at 09/15/2019  7:49 PM EST ----- Please let the patient know that lab looks good. Thanks

## 2019-09-17 ENCOUNTER — Other Ambulatory Visit: Payer: Self-pay

## 2019-09-17 DIAGNOSIS — E876 Hypokalemia: Secondary | ICD-10-CM

## 2019-09-17 MED ORDER — POTASSIUM CHLORIDE CRYS ER 10 MEQ PO TBCR: 10.0000 meq | EXTENDED_RELEASE_TABLET | Freq: Every day | ORAL | 3 refills | Status: DC

## 2019-09-17 NOTE — Progress Notes (Signed)
I think she should be able to go back to one daily. Thanks.

## 2019-09-24 DIAGNOSIS — M6281 Muscle weakness (generalized): Secondary | ICD-10-CM | POA: Diagnosis not present

## 2019-09-24 DIAGNOSIS — Z9889 Other specified postprocedural states: Secondary | ICD-10-CM | POA: Diagnosis not present

## 2019-09-24 DIAGNOSIS — M25611 Stiffness of right shoulder, not elsewhere classified: Secondary | ICD-10-CM | POA: Diagnosis not present

## 2019-09-24 DIAGNOSIS — M25511 Pain in right shoulder: Secondary | ICD-10-CM | POA: Diagnosis not present

## 2019-09-24 DIAGNOSIS — M7521 Bicipital tendinitis, right shoulder: Secondary | ICD-10-CM | POA: Diagnosis not present

## 2019-09-24 DIAGNOSIS — M75121 Complete rotator cuff tear or rupture of right shoulder, not specified as traumatic: Secondary | ICD-10-CM | POA: Diagnosis not present

## 2019-09-24 DIAGNOSIS — M24111 Other articular cartilage disorders, right shoulder: Secondary | ICD-10-CM | POA: Diagnosis not present

## 2019-09-26 DIAGNOSIS — M75121 Complete rotator cuff tear or rupture of right shoulder, not specified as traumatic: Secondary | ICD-10-CM | POA: Diagnosis not present

## 2019-09-26 DIAGNOSIS — M24111 Other articular cartilage disorders, right shoulder: Secondary | ICD-10-CM | POA: Diagnosis not present

## 2019-09-26 DIAGNOSIS — M25611 Stiffness of right shoulder, not elsewhere classified: Secondary | ICD-10-CM | POA: Diagnosis not present

## 2019-09-26 DIAGNOSIS — M7521 Bicipital tendinitis, right shoulder: Secondary | ICD-10-CM | POA: Diagnosis not present

## 2019-09-26 DIAGNOSIS — M6281 Muscle weakness (generalized): Secondary | ICD-10-CM | POA: Diagnosis not present

## 2019-09-26 DIAGNOSIS — Z9889 Other specified postprocedural states: Secondary | ICD-10-CM | POA: Diagnosis not present

## 2019-09-26 DIAGNOSIS — M25511 Pain in right shoulder: Secondary | ICD-10-CM | POA: Diagnosis not present

## 2019-10-01 ENCOUNTER — Other Ambulatory Visit: Payer: Self-pay

## 2019-10-01 DIAGNOSIS — Z9889 Other specified postprocedural states: Secondary | ICD-10-CM | POA: Diagnosis not present

## 2019-10-01 DIAGNOSIS — M7521 Bicipital tendinitis, right shoulder: Secondary | ICD-10-CM | POA: Diagnosis not present

## 2019-10-01 DIAGNOSIS — M6281 Muscle weakness (generalized): Secondary | ICD-10-CM | POA: Diagnosis not present

## 2019-10-01 DIAGNOSIS — M25511 Pain in right shoulder: Secondary | ICD-10-CM | POA: Diagnosis not present

## 2019-10-01 DIAGNOSIS — M75121 Complete rotator cuff tear or rupture of right shoulder, not specified as traumatic: Secondary | ICD-10-CM | POA: Diagnosis not present

## 2019-10-01 DIAGNOSIS — M24111 Other articular cartilage disorders, right shoulder: Secondary | ICD-10-CM | POA: Diagnosis not present

## 2019-10-01 DIAGNOSIS — M25611 Stiffness of right shoulder, not elsewhere classified: Secondary | ICD-10-CM | POA: Diagnosis not present

## 2019-10-01 MED ORDER — FLUTICASONE-SALMETEROL 250-50 MCG/DOSE IN AEPB: 1.0000 | INHALATION_SPRAY | Freq: Two times a day (BID) | RESPIRATORY_TRACT | 3 refills | Status: DC

## 2019-10-03 DIAGNOSIS — M75121 Complete rotator cuff tear or rupture of right shoulder, not specified as traumatic: Secondary | ICD-10-CM | POA: Diagnosis not present

## 2019-10-03 DIAGNOSIS — M25611 Stiffness of right shoulder, not elsewhere classified: Secondary | ICD-10-CM | POA: Diagnosis not present

## 2019-10-03 DIAGNOSIS — M25511 Pain in right shoulder: Secondary | ICD-10-CM | POA: Diagnosis not present

## 2019-10-03 DIAGNOSIS — M7521 Bicipital tendinitis, right shoulder: Secondary | ICD-10-CM | POA: Diagnosis not present

## 2019-10-03 DIAGNOSIS — Z9889 Other specified postprocedural states: Secondary | ICD-10-CM | POA: Diagnosis not present

## 2019-10-03 DIAGNOSIS — M24111 Other articular cartilage disorders, right shoulder: Secondary | ICD-10-CM | POA: Diagnosis not present

## 2019-10-03 DIAGNOSIS — M6281 Muscle weakness (generalized): Secondary | ICD-10-CM | POA: Diagnosis not present

## 2019-10-10 DIAGNOSIS — M24111 Other articular cartilage disorders, right shoulder: Secondary | ICD-10-CM | POA: Diagnosis not present

## 2019-10-10 DIAGNOSIS — M75121 Complete rotator cuff tear or rupture of right shoulder, not specified as traumatic: Secondary | ICD-10-CM | POA: Diagnosis not present

## 2019-10-10 DIAGNOSIS — M6281 Muscle weakness (generalized): Secondary | ICD-10-CM | POA: Diagnosis not present

## 2019-10-10 DIAGNOSIS — M25611 Stiffness of right shoulder, not elsewhere classified: Secondary | ICD-10-CM | POA: Diagnosis not present

## 2019-10-10 DIAGNOSIS — M7521 Bicipital tendinitis, right shoulder: Secondary | ICD-10-CM | POA: Diagnosis not present

## 2019-10-10 DIAGNOSIS — M25511 Pain in right shoulder: Secondary | ICD-10-CM | POA: Diagnosis not present

## 2019-10-10 DIAGNOSIS — Z9889 Other specified postprocedural states: Secondary | ICD-10-CM | POA: Diagnosis not present

## 2019-10-15 DIAGNOSIS — M25611 Stiffness of right shoulder, not elsewhere classified: Secondary | ICD-10-CM | POA: Diagnosis not present

## 2019-10-15 DIAGNOSIS — M7521 Bicipital tendinitis, right shoulder: Secondary | ICD-10-CM | POA: Diagnosis not present

## 2019-10-15 DIAGNOSIS — M25511 Pain in right shoulder: Secondary | ICD-10-CM | POA: Diagnosis not present

## 2019-10-15 DIAGNOSIS — M24111 Other articular cartilage disorders, right shoulder: Secondary | ICD-10-CM | POA: Diagnosis not present

## 2019-10-15 DIAGNOSIS — M6281 Muscle weakness (generalized): Secondary | ICD-10-CM | POA: Diagnosis not present

## 2019-10-15 DIAGNOSIS — M75121 Complete rotator cuff tear or rupture of right shoulder, not specified as traumatic: Secondary | ICD-10-CM | POA: Diagnosis not present

## 2019-10-15 DIAGNOSIS — Z9889 Other specified postprocedural states: Secondary | ICD-10-CM | POA: Diagnosis not present

## 2019-10-21 DIAGNOSIS — M7521 Bicipital tendinitis, right shoulder: Secondary | ICD-10-CM | POA: Diagnosis not present

## 2019-10-21 DIAGNOSIS — M75121 Complete rotator cuff tear or rupture of right shoulder, not specified as traumatic: Secondary | ICD-10-CM | POA: Diagnosis not present

## 2019-10-21 DIAGNOSIS — Z9889 Other specified postprocedural states: Secondary | ICD-10-CM | POA: Diagnosis not present

## 2019-10-21 DIAGNOSIS — M24111 Other articular cartilage disorders, right shoulder: Secondary | ICD-10-CM | POA: Diagnosis not present

## 2019-10-21 DIAGNOSIS — M6281 Muscle weakness (generalized): Secondary | ICD-10-CM | POA: Diagnosis not present

## 2019-10-21 DIAGNOSIS — M25611 Stiffness of right shoulder, not elsewhere classified: Secondary | ICD-10-CM | POA: Diagnosis not present

## 2019-10-21 DIAGNOSIS — M25511 Pain in right shoulder: Secondary | ICD-10-CM | POA: Diagnosis not present

## 2019-10-24 DIAGNOSIS — M7521 Bicipital tendinitis, right shoulder: Secondary | ICD-10-CM | POA: Diagnosis not present

## 2019-10-24 DIAGNOSIS — M6281 Muscle weakness (generalized): Secondary | ICD-10-CM | POA: Diagnosis not present

## 2019-10-24 DIAGNOSIS — M25611 Stiffness of right shoulder, not elsewhere classified: Secondary | ICD-10-CM | POA: Diagnosis not present

## 2019-10-24 DIAGNOSIS — Z9889 Other specified postprocedural states: Secondary | ICD-10-CM | POA: Diagnosis not present

## 2019-10-24 DIAGNOSIS — M0579 Rheumatoid arthritis with rheumatoid factor of multiple sites without organ or systems involvement: Secondary | ICD-10-CM | POA: Diagnosis not present

## 2019-10-24 DIAGNOSIS — M75121 Complete rotator cuff tear or rupture of right shoulder, not specified as traumatic: Secondary | ICD-10-CM | POA: Diagnosis not present

## 2019-10-24 DIAGNOSIS — M24111 Other articular cartilage disorders, right shoulder: Secondary | ICD-10-CM | POA: Diagnosis not present

## 2019-10-24 DIAGNOSIS — M25511 Pain in right shoulder: Secondary | ICD-10-CM | POA: Diagnosis not present

## 2019-10-24 DIAGNOSIS — Z79899 Other long term (current) drug therapy: Secondary | ICD-10-CM | POA: Diagnosis not present

## 2019-10-29 DIAGNOSIS — Z9889 Other specified postprocedural states: Secondary | ICD-10-CM | POA: Diagnosis not present

## 2019-10-29 DIAGNOSIS — M75121 Complete rotator cuff tear or rupture of right shoulder, not specified as traumatic: Secondary | ICD-10-CM | POA: Diagnosis not present

## 2019-10-29 DIAGNOSIS — M25511 Pain in right shoulder: Secondary | ICD-10-CM | POA: Diagnosis not present

## 2019-10-29 DIAGNOSIS — M25611 Stiffness of right shoulder, not elsewhere classified: Secondary | ICD-10-CM | POA: Diagnosis not present

## 2019-10-29 DIAGNOSIS — M6281 Muscle weakness (generalized): Secondary | ICD-10-CM | POA: Diagnosis not present

## 2019-10-29 DIAGNOSIS — M24111 Other articular cartilage disorders, right shoulder: Secondary | ICD-10-CM | POA: Diagnosis not present

## 2019-10-29 DIAGNOSIS — M7521 Bicipital tendinitis, right shoulder: Secondary | ICD-10-CM | POA: Diagnosis not present

## 2019-11-05 DIAGNOSIS — M25611 Stiffness of right shoulder, not elsewhere classified: Secondary | ICD-10-CM | POA: Diagnosis not present

## 2019-11-05 DIAGNOSIS — M25511 Pain in right shoulder: Secondary | ICD-10-CM | POA: Diagnosis not present

## 2019-11-05 DIAGNOSIS — M6281 Muscle weakness (generalized): Secondary | ICD-10-CM | POA: Diagnosis not present

## 2019-11-05 DIAGNOSIS — M7521 Bicipital tendinitis, right shoulder: Secondary | ICD-10-CM | POA: Diagnosis not present

## 2019-11-05 DIAGNOSIS — M24111 Other articular cartilage disorders, right shoulder: Secondary | ICD-10-CM | POA: Diagnosis not present

## 2019-11-05 DIAGNOSIS — Z9889 Other specified postprocedural states: Secondary | ICD-10-CM | POA: Diagnosis not present

## 2019-11-05 DIAGNOSIS — M75121 Complete rotator cuff tear or rupture of right shoulder, not specified as traumatic: Secondary | ICD-10-CM | POA: Diagnosis not present

## 2019-11-07 DIAGNOSIS — E782 Mixed hyperlipidemia: Secondary | ICD-10-CM | POA: Diagnosis not present

## 2019-11-07 DIAGNOSIS — Z9989 Dependence on other enabling machines and devices: Secondary | ICD-10-CM | POA: Diagnosis not present

## 2019-11-07 DIAGNOSIS — I2510 Atherosclerotic heart disease of native coronary artery without angina pectoris: Secondary | ICD-10-CM | POA: Diagnosis not present

## 2019-11-07 DIAGNOSIS — I6523 Occlusion and stenosis of bilateral carotid arteries: Secondary | ICD-10-CM | POA: Diagnosis not present

## 2019-11-07 DIAGNOSIS — G4733 Obstructive sleep apnea (adult) (pediatric): Secondary | ICD-10-CM | POA: Diagnosis not present

## 2019-11-12 DIAGNOSIS — Z170 Estrogen receptor positive status [ER+]: Secondary | ICD-10-CM | POA: Diagnosis not present

## 2019-11-12 DIAGNOSIS — M0579 Rheumatoid arthritis with rheumatoid factor of multiple sites without organ or systems involvement: Secondary | ICD-10-CM | POA: Diagnosis not present

## 2019-11-12 DIAGNOSIS — G8929 Other chronic pain: Secondary | ICD-10-CM | POA: Diagnosis not present

## 2019-11-12 DIAGNOSIS — M25511 Pain in right shoulder: Secondary | ICD-10-CM | POA: Diagnosis not present

## 2019-11-12 DIAGNOSIS — C50911 Malignant neoplasm of unspecified site of right female breast: Secondary | ICD-10-CM | POA: Diagnosis not present

## 2019-11-12 DIAGNOSIS — Z79899 Other long term (current) drug therapy: Secondary | ICD-10-CM | POA: Diagnosis not present

## 2019-11-12 DIAGNOSIS — M8949 Other hypertrophic osteoarthropathy, multiple sites: Secondary | ICD-10-CM | POA: Diagnosis not present

## 2019-11-15 ENCOUNTER — Other Ambulatory Visit: Payer: Self-pay

## 2019-11-15 DIAGNOSIS — F411 Generalized anxiety disorder: Secondary | ICD-10-CM

## 2019-11-15 MED ORDER — CITALOPRAM HYDROBROMIDE 40 MG PO TABS: 40.0000 mg | ORAL_TABLET | Freq: Every day | ORAL | 1 refills | Status: DC

## 2019-11-28 ENCOUNTER — Ambulatory Visit: Payer: Medicare HMO | Admitting: Nurse Practitioner

## 2019-12-03 ENCOUNTER — Other Ambulatory Visit: Payer: Self-pay

## 2019-12-03 DIAGNOSIS — Z9889 Other specified postprocedural states: Secondary | ICD-10-CM | POA: Diagnosis not present

## 2019-12-03 DIAGNOSIS — F411 Generalized anxiety disorder: Secondary | ICD-10-CM

## 2019-12-03 DIAGNOSIS — M5136 Other intervertebral disc degeneration, lumbar region: Secondary | ICD-10-CM | POA: Diagnosis not present

## 2019-12-03 DIAGNOSIS — M5416 Radiculopathy, lumbar region: Secondary | ICD-10-CM | POA: Diagnosis not present

## 2019-12-04 MED ORDER — ALPRAZOLAM 0.25 MG PO TABS: 0.2500 mg | ORAL_TABLET | Freq: Two times a day (BID) | ORAL | 0 refills | Status: DC | PRN

## 2019-12-17 DIAGNOSIS — M5416 Radiculopathy, lumbar region: Secondary | ICD-10-CM | POA: Diagnosis not present

## 2019-12-17 DIAGNOSIS — Z9889 Other specified postprocedural states: Secondary | ICD-10-CM | POA: Diagnosis not present

## 2019-12-20 ENCOUNTER — Other Ambulatory Visit: Payer: Self-pay | Admitting: Student

## 2019-12-20 DIAGNOSIS — M5416 Radiculopathy, lumbar region: Secondary | ICD-10-CM

## 2019-12-23 DIAGNOSIS — Z9989 Dependence on other enabling machines and devices: Secondary | ICD-10-CM | POA: Diagnosis not present

## 2019-12-23 DIAGNOSIS — I214 Non-ST elevation (NSTEMI) myocardial infarction: Secondary | ICD-10-CM | POA: Diagnosis not present

## 2019-12-23 DIAGNOSIS — G4733 Obstructive sleep apnea (adult) (pediatric): Secondary | ICD-10-CM | POA: Diagnosis not present

## 2019-12-23 DIAGNOSIS — I6523 Occlusion and stenosis of bilateral carotid arteries: Secondary | ICD-10-CM | POA: Diagnosis not present

## 2019-12-23 DIAGNOSIS — I2510 Atherosclerotic heart disease of native coronary artery without angina pectoris: Secondary | ICD-10-CM | POA: Diagnosis not present

## 2019-12-30 DIAGNOSIS — M48062 Spinal stenosis, lumbar region with neurogenic claudication: Secondary | ICD-10-CM | POA: Diagnosis not present

## 2019-12-30 DIAGNOSIS — M5412 Radiculopathy, cervical region: Secondary | ICD-10-CM | POA: Diagnosis not present

## 2019-12-30 DIAGNOSIS — M5030 Other cervical disc degeneration, unspecified cervical region: Secondary | ICD-10-CM | POA: Diagnosis not present

## 2019-12-30 DIAGNOSIS — M5416 Radiculopathy, lumbar region: Secondary | ICD-10-CM | POA: Diagnosis not present

## 2019-12-30 DIAGNOSIS — M5136 Other intervertebral disc degeneration, lumbar region: Secondary | ICD-10-CM | POA: Diagnosis not present

## 2020-01-03 ENCOUNTER — Other Ambulatory Visit: Payer: Self-pay

## 2020-01-03 ENCOUNTER — Ambulatory Visit: Admission: RE | Admit: 2020-01-03 | Payer: Medicare HMO | Source: Ambulatory Visit

## 2020-01-03 ENCOUNTER — Ambulatory Visit: Payer: Medicare HMO

## 2020-01-03 ENCOUNTER — Telehealth: Payer: Self-pay

## 2020-01-03 ENCOUNTER — Ambulatory Visit
Admission: RE | Admit: 2020-01-03 | Discharge: 2020-01-03 | Disposition: A | Discharge status: Home / Self Care | Payer: Medicare HMO | Source: Ambulatory Visit | Attending: Student | Admitting: Student

## 2020-01-03 DIAGNOSIS — M48061 Spinal stenosis, lumbar region without neurogenic claudication: Secondary | ICD-10-CM | POA: Diagnosis not present

## 2020-01-03 DIAGNOSIS — M5416 Radiculopathy, lumbar region: Secondary | ICD-10-CM | POA: Diagnosis not present

## 2020-01-03 DIAGNOSIS — M5126 Other intervertebral disc displacement, lumbar region: Secondary | ICD-10-CM | POA: Diagnosis not present

## 2020-01-03 MED ORDER — GADOBUTROL 1 MMOL/ML IV SOLN
6.0000 mL | Freq: Once | INTRAVENOUS | Status: AC | PRN
  Administered 2020-01-03: 6 mL via INTRAVENOUS

## 2020-01-03 NOTE — Telephone Encounter (Signed)
Confirmed and screened for 01-07-20 ov.

## 2020-01-06 DIAGNOSIS — M5136 Other intervertebral disc degeneration, lumbar region: Secondary | ICD-10-CM | POA: Diagnosis not present

## 2020-01-06 DIAGNOSIS — M4180 Other forms of scoliosis, site unspecified: Secondary | ICD-10-CM | POA: Diagnosis not present

## 2020-01-06 DIAGNOSIS — M5126 Other intervertebral disc displacement, lumbar region: Secondary | ICD-10-CM | POA: Diagnosis not present

## 2020-01-07 ENCOUNTER — Ambulatory Visit (INDEPENDENT_AMBULATORY_CARE_PROVIDER_SITE_OTHER): Payer: Medicare HMO | Admitting: Nurse Practitioner

## 2020-01-07 ENCOUNTER — Other Ambulatory Visit: Payer: Self-pay

## 2020-01-07 ENCOUNTER — Encounter: Payer: Self-pay | Admitting: Nurse Practitioner

## 2020-01-07 VITALS — BP 127/65 | HR 73 | Temp 97.5°F | Resp 16 | Ht 65.0 in | Wt 144.8 lb

## 2020-01-07 DIAGNOSIS — F411 Generalized anxiety disorder: Secondary | ICD-10-CM

## 2020-01-07 DIAGNOSIS — D0511 Intraductal carcinoma in situ of right breast: Secondary | ICD-10-CM

## 2020-01-07 DIAGNOSIS — E782 Mixed hyperlipidemia: Secondary | ICD-10-CM | POA: Diagnosis not present

## 2020-01-07 DIAGNOSIS — I10 Essential (primary) hypertension: Secondary | ICD-10-CM

## 2020-01-07 MED ORDER — ESCITALOPRAM OXALATE 20 MG PO TABS: 20.0000 mg | ORAL_TABLET | Freq: Every day | ORAL | 3 refills | Status: DC

## 2020-01-07 MED ORDER — ALPRAZOLAM 0.25 MG PO TABS: 0.2500 mg | ORAL_TABLET | Freq: Two times a day (BID) | ORAL | 3 refills | Status: DC | PRN

## 2020-01-07 NOTE — Progress Notes (Signed)
[facility] [place], [place] [address]  Internal MEDICINE  Office Visit Note  Patient Name: Michelle Moore  [id number]  [id number]  Date of Service: 01/22/2020  Chief Complaint  Patient presents with  . Gastroesophageal Reflux  . Anxiety  . Arthritis    The patient is here for routine follow up. States that she has been having increased issues with her depression. Unsure that current dose of citalopram is really helping as much as it used to. She is fatigued often. Feels unmotivated. States that she is irritable and snaps and friends and family without provocation. She cries more easily than she used to. Feels hopeless at times. She is not suicidal. Denies feelings that she wants to hurt herself or anyone else.  Her blood pressure remains well managed. She will be due soon for routine, fasting labs.       Current Medication: Outpatient Encounter Medications as of 01/07/2020  Medication Sig  . ALPRAZolam (XANAX) 0.25 MG tablet Take 1 tablet (0.25 mg total) by mouth 2 (two) times daily as needed for anxiety.  Marland Kitchen anastrozole (ARIMIDEX) 1 MG tablet Take 1 tablet (1 mg total) by mouth daily.  . Aspirin-Acetaminophen-Caffeine (EXCEDRIN EXTRA STRENGTH PO) Take 2 tablets by mouth 2 (two) times daily as needed (for arm and neck pain).   . cetirizine (ZYRTEC) 10 MG tablet Take 10 mg by mouth daily as needed for allergies.   . citalopram (CELEXA) 40 MG tablet Take 1 tablet (40 mg total) by mouth at bedtime.  . fluticasone (FLONASE) 50 MCG/ACT nasal spray Place 2 sprays into both nostrils daily as needed for allergies.  . Fluticasone-Salmeterol (ADVAIR) 250-50 MCG/DOSE AEPB Inhale 1 puff into the lungs 2 (two) times daily.  . folic acid (FOLVITE) 270 MCG tablet Take 1,600 mcg by mouth daily.  . methotrexate (RHEUMATREX) 2.5 MG tablet Take 20 mg by mouth every Wednesday.   . Olopatadine HCl 0.2 % SOLN Place 1 drop into both eyes daily as needed (allergies).  . Omeprazole  20 MG TBEC Take 20 mg by mouth every morning.   . potassium chloride (KLOR-CON) 10 MEQ tablet Take 1 tablet (10 mEq total) by mouth daily.  . simvastatin (ZOCOR) 40 MG tablet Take 1 tablet (40 mg total) by mouth at bedtime.  . [DISCONTINUED] ALPRAZolam (XANAX) 0.25 MG tablet Take 1 tablet (0.25 mg total) by mouth 2 (two) times daily as needed for anxiety.  . [DISCONTINUED] montelukast (SINGULAIR) 10 MG tablet Take 1 tablet (10 mg total) by mouth at bedtime.  Marland Kitchen escitalopram (LEXAPRO) 20 MG tablet Take 1 tablet (20 mg total) by mouth daily.  . [DISCONTINUED] hydrochlorothiazide (HYDRODIURIL) 25 MG tablet Take 25 mg by mouth daily. Pt takes for her ear due to hearing loss-not htn  . [DISCONTINUED] oxyCODONE (ROXICODONE) 5 MG immediate release tablet Take 1-2 tablets (5-10 mg total) by mouth every 4 (four) hours as needed for moderate pain or severe pain. (Patient not taking: Reported on 01/07/2020)   No facility-administered encounter medications on file as of 01/07/2020.    Surgical History: Past Surgical History:  Procedure Laterality Date  . ABDOMINAL HYSTERECTOMY    . ANTERIOR CERVICAL DECOMP/DISCECTOMY FUSION N/A 01/20/2014   Procedure: ANTERIOR CERVICAL DECOMPRESSION/DISCECTOMY FUSION 2 LEVELS cervical five/six six Tarry Kos;  Surgeon: Ophelia Charter, MD;  Location: [facility];  Service: Neurosurgery;  Laterality: N/A;  . BACK SURGERY     lumbar  . 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  BREAST BIOPSY  01/16/2019   coil, pending path  . BREAST LUMPECTOMY Right 03/07/2018   Procedure: BREAST LUMPECTOMY/ RE EXCISION;  Surgeon: Herbert Pun, MD;  Location: [facility] ORS;  Service: General;  Laterality: Right;  . CARDIAC CATHETERIZATION     2011  . CORONARY ANGIOPLASTY WITH STENT PLACEMENT     PLACED IN RCA 2011  . EYE SURGERY     lasik  . PARTIAL MASTECTOMY WITH NEEDLE LOCALIZATION Right 02/21/2018   Procedure: PARTIAL MASTECTOMY WITH NEEDLE LOCALIZATION;  Surgeon:  Herbert Pun, MD;  Location: [facility] ORS;  Service: General;  Laterality: Right;  . ROTATOR CUFF REPAIR     left shoulder  . SHOULDER ARTHROSCOPY WITH ROTATOR CUFF REPAIR AND OPEN BICEPS TENODESIS Right 08/07/2019   Procedure: SHOULDER ARTHROSCOPY WITH DEBRIDEMENT, DECOMPRESSION, ROTATOR CUFF REPAIR AND BICEPS TENODESIS. - RNFA;  Surgeon: Corky Mull, MD;  Location: [facility] ORS;  Service: Orthopedics;  Laterality: Right;  . TOE SURGERY Right    has pin and plate in it    Medical History: Past Medical History:  Diagnosis Date  . Anxiety   . Arthritis   . Cancer ([place])    breast-right   . COPD (chronic obstructive pulmonary disease) ([place])   . Dyspnea    ONLY WITH EXERTION  . GERD (gastroesophageal reflux disease)    "sometimes"  . Headache(784.0)    sinus headaches  . Myocardial infarction ([place])    in 2011  no damage-1 stent  . Personal history of radiation therapy   . Seizures ([place]) 2014   X1-no idea what caused her seizure  . Sleep apnea    USES CPAP    Family History: Family History  Problem Relation Age of Onset  . Breast cancer Cousin 31       bilateral at 79 and 16; daughter of maternal aunt who was unaffected  . Lung cancer Maternal Aunt        dx 75s; deceased 34s; smoker  . Heart attack Father        deceased 52  . Stomach cancer Maternal Grandmother     Social History   Socioeconomic History  . Marital status: Married    Spouse name: Not on file  . Number of children: Not on file  . Years of education: Not on file  . Highest education level: Not on file  Occupational History  . Not on file  Tobacco Use  . Smoking status: Former Smoker    Packs/day: 1.00    Years: 30.00    Pack years: 30.00    Types: Cigarettes    Quit date: 09/13/2015    Years since quitting: 4.3  . Smokeless tobacco: Never Used  Substance and Sexual Activity  . Alcohol use: Yes    Comment: occasional  . Drug use: No  . Sexual activity: Not on file  Other Topics Concern   . Not on file  Social History Narrative  . Not on file   Social Determinants of Health   Financial Resource Strain:   . Difficulty of Paying Living Expenses:   Food Insecurity:   . Worried About Charity fundraiser in the Last Year:   . Arboriculturist in the Last Year:   Transportation Needs:   . Film/video editor (Medical):   Marland Kitchen Lack of Transportation (Non-Medical):   Physical Activity:   . Days of Exercise per Week:   . Minutes of Exercise per Session:   Stress:   . Feeling of  Stress :   Social Connections:   . Frequency of Communication with Friends and Family:   . Frequency of Social Gatherings with Friends and Family:   . Attends Religious Services:   . Active Member of Clubs or Organizations:   . Attends Archivist Meetings:   Marland Kitchen Marital Status:   Intimate Partner Violence:   . Fear of Current or Ex-Partner:   . Emotionally Abused:   Marland Kitchen Physically Abused:   . Sexually Abused:       Review of Systems  Constitutional: Positive for fatigue. Negative for chills and unexpected weight change.  HENT: Negative for congestion, postnasal drip, rhinorrhea, sneezing and sore throat.   Respiratory: Negative for cough, chest tightness and shortness of breath.   Cardiovascular: Negative for chest pain and palpitations.  Gastrointestinal: Negative for abdominal pain, constipation, diarrhea, nausea and vomiting.  Endocrine: Negative for cold intolerance, heat intolerance, polydipsia and polyuria.  Musculoskeletal: Positive for arthralgias and back pain. Negative for joint swelling and neck pain.       Generalized joint pain. Does see rheumatology  Skin: Negative for rash.  Allergic/Immunologic: Positive for environmental allergies.  Neurological: Negative for dizziness, tremors, numbness and headaches.  Hematological: Negative for adenopathy. Does not bruise/bleed easily.  Psychiatric/Behavioral: Positive for dysphoric mood. Negative for behavioral problems  (Depression), sleep disturbance and suicidal ideas. The patient is nervous/anxious.        Symptoms recently worse.    Today's Vitals   01/07/20 1433  BP: 127/65  Pulse: 73  Resp: 16  Temp: (!) 97.5 F (36.4 C)  SpO2: 93%  Weight: 144 lb 12.8 oz (65.7 kg)   Body mass index is 24.1 kg/m.  Physical Exam Vitals and nursing note reviewed.  Constitutional:      General: She is not in acute distress.    Appearance: Normal appearance. She is well-developed. She is not diaphoretic.  HENT:     Head: Normocephalic and atraumatic.     Nose: Nose normal.     Mouth/Throat:     Pharynx: No oropharyngeal exudate.  Eyes:     Pupils: Pupils are equal, round, and reactive to light.  Neck:     Thyroid: No thyromegaly.     Vascular: No JVD.     Trachea: No tracheal deviation.  Cardiovascular:     Rate and Rhythm: Normal rate and regular rhythm.     Pulses: Normal pulses.     Heart sounds: Normal heart sounds. No murmur. No friction rub. No gallop.   Pulmonary:     Effort: Pulmonary effort is normal. No respiratory distress.     Breath sounds: Normal breath sounds. No wheezing or rales.  Chest:     Chest wall: No tenderness.  Abdominal:     General: Bowel sounds are normal.     Palpations: Abdomen is soft.     Tenderness: There is no abdominal tenderness.  Musculoskeletal:        General: Normal range of motion.     Cervical back: Normal range of motion and neck supple.  Lymphadenopathy:     Cervical: No cervical adenopathy.  Skin:    General: Skin is warm and dry.  Neurological:     Mental Status: She is alert and oriented to person, place, and time.     Cranial Nerves: No cranial nerve deficit.  Psychiatric:        Attention and Perception: Attention and perception normal.        Mood and Affect:  Mood is anxious and depressed. Affect is tearful.        Speech: Speech normal.        Behavior: Behavior normal. Behavior is cooperative.        Thought Content: Thought content  normal.        Cognition and Memory: Cognition and memory normal.        Judgment: Judgment normal.    Assessment/Plan: 1. Essential hypertension Stable. Continue bp medication as prescribed   2. Mixed hyperlipidemia Continue simvastatin as prescribed   3. Generalized anxiety disorder D/c citalopram and start escitalopram 20mg  tablets daily. Written instructions provided to wean off citalopram while starting lexapro. Patient voiced understanding. She may continue to take alprazolam 0.25mg  tablets up to twice daily as needed for acute anxiety.  - escitalopram (LEXAPRO) 20 MG tablet; Take 1 tablet (20 mg total) by mouth daily.  Dispense: 30 tablet; Refill: 3 - ALPRAZolam (XANAX) 0.25 MG tablet; Take 1 tablet (0.25 mg total) by mouth 2 (two) times daily as needed for anxiety.  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   General Counseling: diahn waidelich understanding of the findings of todays visit and agrees with plan of treatment. I have discussed any further diagnostic evaluation that may be needed or ordered today. We also reviewed her medications today. she has been encouraged to call the office with any questions or concerns that should arise related to todays visit.   This patient was seen by Banks with Dr Lavera Guise as a part of collaborative care agreement  Meds ordered this encounter  Medications  . escitalopram (LEXAPRO) 20 MG tablet    Sig: Take 1 tablet (20 mg total) by mouth daily.    Dispense:  30 tablet    Refill:  3    Instructions given to wean off citalopram.    Order Specific Question:   Supervising Provider    Answer:   Lavera Guise [[id number]]  . ALPRAZolam (XANAX) 0.25 MG tablet    Sig: Take 1 tablet (0.25 mg total) by mouth 2 (two) times daily as needed for anxiety.    Dispense:  60 tablet    Refill:  3    Order Specific Question:   Supervising  Provider    Answer:   Lavera Guise [[id number]]    Total time spent: 30 Minutes  Time spent includes review of chart, medications, test results, and follow up plan with the patient.      Dr Lavera Guise Internal medicine

## 2020-01-14 ENCOUNTER — Other Ambulatory Visit: Payer: Self-pay

## 2020-01-14 DIAGNOSIS — J3089 Other allergic rhinitis: Secondary | ICD-10-CM

## 2020-01-14 MED ORDER — MONTELUKAST SODIUM 10 MG PO TABS: 10.0000 mg | ORAL_TABLET | Freq: Every day | ORAL | 1 refills | Status: DC

## 2020-01-17 DIAGNOSIS — G8929 Other chronic pain: Secondary | ICD-10-CM | POA: Diagnosis not present

## 2020-01-17 DIAGNOSIS — M5442 Lumbago with sciatica, left side: Secondary | ICD-10-CM | POA: Diagnosis not present

## 2020-01-17 DIAGNOSIS — M4180 Other forms of scoliosis, site unspecified: Secondary | ICD-10-CM | POA: Diagnosis not present

## 2020-01-17 DIAGNOSIS — M6281 Muscle weakness (generalized): Secondary | ICD-10-CM | POA: Diagnosis not present

## 2020-01-20 DIAGNOSIS — M5442 Lumbago with sciatica, left side: Secondary | ICD-10-CM | POA: Diagnosis not present

## 2020-01-20 DIAGNOSIS — M4180 Other forms of scoliosis, site unspecified: Secondary | ICD-10-CM | POA: Diagnosis not present

## 2020-01-20 DIAGNOSIS — G8929 Other chronic pain: Secondary | ICD-10-CM | POA: Diagnosis not present

## 2020-01-20 DIAGNOSIS — M6281 Muscle weakness (generalized): Secondary | ICD-10-CM | POA: Diagnosis not present

## 2020-01-22 DIAGNOSIS — I10 Essential (primary) hypertension: Secondary | ICD-10-CM | POA: Insufficient documentation

## 2020-01-23 DIAGNOSIS — G8929 Other chronic pain: Secondary | ICD-10-CM | POA: Diagnosis not present

## 2020-01-23 DIAGNOSIS — M4180 Other forms of scoliosis, site unspecified: Secondary | ICD-10-CM | POA: Diagnosis not present

## 2020-01-23 DIAGNOSIS — M5442 Lumbago with sciatica, left side: Secondary | ICD-10-CM | POA: Diagnosis not present

## 2020-01-23 DIAGNOSIS — M6281 Muscle weakness (generalized): Secondary | ICD-10-CM | POA: Diagnosis not present

## 2020-01-28 DIAGNOSIS — G8929 Other chronic pain: Secondary | ICD-10-CM | POA: Diagnosis not present

## 2020-01-28 DIAGNOSIS — M5442 Lumbago with sciatica, left side: Secondary | ICD-10-CM | POA: Diagnosis not present

## 2020-01-28 DIAGNOSIS — M25511 Pain in right shoulder: Secondary | ICD-10-CM | POA: Diagnosis not present

## 2020-01-28 DIAGNOSIS — M4180 Other forms of scoliosis, site unspecified: Secondary | ICD-10-CM | POA: Diagnosis not present

## 2020-01-28 DIAGNOSIS — M6281 Muscle weakness (generalized): Secondary | ICD-10-CM | POA: Diagnosis not present

## 2020-02-04 ENCOUNTER — Ambulatory Visit
Admission: RE | Admit: 2020-02-04 | Discharge: 2020-02-04 | Disposition: A | Discharge status: Home / Self Care | Payer: Medicare HMO | Source: Ambulatory Visit | Attending: Oncology | Admitting: Oncology

## 2020-02-04 DIAGNOSIS — D0511 Intraductal carcinoma in situ of right breast: Secondary | ICD-10-CM

## 2020-02-04 DIAGNOSIS — M4180 Other forms of scoliosis, site unspecified: Secondary | ICD-10-CM | POA: Diagnosis not present

## 2020-02-04 DIAGNOSIS — Z853 Personal history of malignant neoplasm of breast: Secondary | ICD-10-CM | POA: Diagnosis not present

## 2020-02-04 DIAGNOSIS — M5442 Lumbago with sciatica, left side: Secondary | ICD-10-CM | POA: Diagnosis not present

## 2020-02-04 DIAGNOSIS — G8929 Other chronic pain: Secondary | ICD-10-CM | POA: Diagnosis not present

## 2020-02-04 DIAGNOSIS — M6281 Muscle weakness (generalized): Secondary | ICD-10-CM | POA: Diagnosis not present

## 2020-02-04 DIAGNOSIS — R922 Inconclusive mammogram: Secondary | ICD-10-CM | POA: Diagnosis not present

## 2020-02-06 DIAGNOSIS — M5442 Lumbago with sciatica, left side: Secondary | ICD-10-CM | POA: Diagnosis not present

## 2020-02-06 DIAGNOSIS — M4180 Other forms of scoliosis, site unspecified: Secondary | ICD-10-CM | POA: Diagnosis not present

## 2020-02-06 DIAGNOSIS — G8929 Other chronic pain: Secondary | ICD-10-CM | POA: Diagnosis not present

## 2020-02-06 DIAGNOSIS — M6281 Muscle weakness (generalized): Secondary | ICD-10-CM | POA: Diagnosis not present

## 2020-02-12 ENCOUNTER — Other Ambulatory Visit: Payer: Self-pay

## 2020-02-12 DIAGNOSIS — E876 Hypokalemia: Secondary | ICD-10-CM

## 2020-02-12 DIAGNOSIS — M5442 Lumbago with sciatica, left side: Secondary | ICD-10-CM | POA: Diagnosis not present

## 2020-02-12 DIAGNOSIS — M4180 Other forms of scoliosis, site unspecified: Secondary | ICD-10-CM | POA: Diagnosis not present

## 2020-02-12 DIAGNOSIS — M0579 Rheumatoid arthritis with rheumatoid factor of multiple sites without organ or systems involvement: Secondary | ICD-10-CM | POA: Diagnosis not present

## 2020-02-12 DIAGNOSIS — G8929 Other chronic pain: Secondary | ICD-10-CM | POA: Diagnosis not present

## 2020-02-12 DIAGNOSIS — M6281 Muscle weakness (generalized): Secondary | ICD-10-CM | POA: Diagnosis not present

## 2020-02-12 DIAGNOSIS — Z79899 Other long term (current) drug therapy: Secondary | ICD-10-CM | POA: Diagnosis not present

## 2020-02-12 MED ORDER — POTASSIUM CHLORIDE CRYS ER 10 MEQ PO TBCR: 10.0000 meq | EXTENDED_RELEASE_TABLET | Freq: Every day | ORAL | 3 refills | Status: DC

## 2020-02-13 DIAGNOSIS — C50911 Malignant neoplasm of unspecified site of right female breast: Secondary | ICD-10-CM | POA: Diagnosis not present

## 2020-02-13 DIAGNOSIS — Z170 Estrogen receptor positive status [ER+]: Secondary | ICD-10-CM | POA: Diagnosis not present

## 2020-02-13 DIAGNOSIS — G8929 Other chronic pain: Secondary | ICD-10-CM | POA: Diagnosis not present

## 2020-02-13 DIAGNOSIS — M25511 Pain in right shoulder: Secondary | ICD-10-CM | POA: Diagnosis not present

## 2020-02-13 DIAGNOSIS — M8949 Other hypertrophic osteoarthropathy, multiple sites: Secondary | ICD-10-CM | POA: Diagnosis not present

## 2020-02-13 DIAGNOSIS — M65321 Trigger finger, right index finger: Secondary | ICD-10-CM | POA: Diagnosis not present

## 2020-02-13 DIAGNOSIS — M0579 Rheumatoid arthritis with rheumatoid factor of multiple sites without organ or systems involvement: Secondary | ICD-10-CM | POA: Diagnosis not present

## 2020-02-14 NOTE — Progress Notes (Signed)
[name]  Telephone:([phone] [phone]  HEMATOLOGY-ONCOLOGY TELEMEDICINE VISIT PROGRESS NOTE  ID: Michelle Moore OB: Sep 21, 1955  MR#: [id number]  DPO#:[id number]  Patient Care Team: Lavera Guise, MD as PCP - General (Internal Medicine)   CHIEF COMPLAINT: DCIS, right breast.  INTERVAL HISTORY: Patient returns to clinic today for routine 68-monthevaluation.  She continues to have significant back pain and pain from her rheumatoid arthritis, but otherwise feels well.  She is tolerating anastrozole without significant side effects. She has no neurologic complaints.  She denies any recent fevers or illnesses.  She has a good appetite and denies weight loss.  She denies any chest pain, shortness of breath, cough, or hemoptysis.  She denies any nausea, vomiting, constipation, or diarrhea. She has no urinary complaints.  Patient offers no further specific complaints today.  REVIEW OF SYSTEMS:   Review of Systems  Constitutional: Negative.  Negative for fever, malaise/fatigue and weight loss.  Respiratory: Negative.  Negative for cough, hemoptysis and shortness of breath.   Cardiovascular: Negative.  Negative for chest pain and leg swelling.  Gastrointestinal: Negative.  Negative for abdominal pain.  Genitourinary: Negative.  Negative for dysuria.  Musculoskeletal: Negative.  Negative for back pain and myalgias.  Skin: Negative.  Negative for rash.  Neurological: Negative.  Negative for dizziness, sensory change, focal weakness and weakness.  Psychiatric/Behavioral: Negative.  The patient is not nervous/anxious.     As per HPI. Otherwise, a complete review of systems is negative.  PAST MEDICAL HISTORY: Past Medical History:  Diagnosis Date  . Anxiety   . Arthritis   . [place]    breast[place]right   . [place]   . Dyspnea    ONLY WITH EXERTION  . GERD (gastroesophageal reflux disease)    "sometimes"  .  Headache(784.0)    sinus headaches  . [place]    in [place]  no damage-1 stent  . Personal history of radiation therapy   . Seizures ([place]   X1-no idea what caused her seizure  . Sleep apnea    USES CPAP    PAST SURGICAL HISTORY: Past Surgical History:  Procedure Laterality Date  . ABDOMINAL HYSTERECTOMY    . ANTERIOR CERVICAL DECOMP/DISCECTOMY FUSION N/A 01/20/2014   Procedure: ANTERIOR CERVICAL DECOMPRESSION/DISCECTOMY FUSION 2 LEVELS cervical five/six six /Tarry Kos  Surgeon: JOphelia Charter MD;  [facility];  Service: Neurosurgery;  Laterality: N/A;  . BACK SURGERY     lumbar  . 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 BREAST BIOPSY  01/16/2019   coil, BENIGN MAMMARY TISSUE WITH MODERATE STROMAL FIBROSIS AND COARSE DYSTROPHIC CALCIFICATIONS of the RIGHT breast  . BREAST LUMPECTOMY Right 03/07/2018   Procedure: BREAST LUMPECTOMY/ RE EXCISION;  Surgeon: CHerbert Pun MD;  Location: [facility];  Service: General;  Laterality: Right;  . CARDIAC CATHETERIZATION     2011  . CORONARY ANGIOPLASTY WITH STENT PLACEMENT     PLACED IN RCA 2011  . EYE SURGERY     lasik  . PARTIAL MASTECTOMY WITH NEEDLE LOCALIZATION Right 02/21/2018   Procedure: PARTIAL MASTECTOMY WITH NEEDLE LOCALIZATION;  Surgeon: CHerbert Pun MD;  Location: [facility];  Service: General;  Laterality: Right;  . ROTATOR CUFF REPAIR     left shoulder  . SHOULDER ARTHROSCOPY WITH ROTATOR CUFF REPAIR AND OPEN BICEPS TENODESIS Right 08/07/2019   Procedure: SHOULDER ARTHROSCOPY WITH DEBRIDEMENT, DECOMPRESSION, ROTATOR CUFF REPAIR AND BICEPS TENODESIS. -  RNFA;  Surgeon: Corky Mull, MD;  Location: [facility];  Service: Orthopedics;  Laterality: Right;  . TOE SURGERY Right    has pin and plate in it    FAMILY HISTORY: Family History  Problem Relation Age of Onset  . Breast cancer Cousin 7       bilateral at 8 and 61; daughter of maternal aunt who was unaffected  .  Lung cancer Maternal Aunt        dx 49s; deceased 59s; smoker  . Heart attack Father        deceased 20  . Stomach cancer Maternal Grandmother     ADVANCED DIRECTIVES (Y/N):  N  HEALTH MAINTENANCE: Social History   Tobacco Use  . Smoking status: Former Smoker    Packs/day: 1.00    Years: 30.00    Pack years: 30.00    Types: Cigarettes    Quit date: 09/13/2015    Years since quitting: 4.4  . Smokeless tobacco: Never Used  Vaping Use  . Vaping Use: Never used  Substance Use Topics  . Alcohol use: Yes    Comment: occasional  . Drug use: No     Colonoscopy:  PAP:  Bone density:  Lipid panel:  Allergies  Allergen Reactions  . Other     BANDAIDS-OF LEFT ON FOR AN EXTENDED PERIOD OF TIME    Current Outpatient Medications  Medication Sig Dispense Refill  . ALPRAZolam (XANAX) 0.25 MG tablet Take 1 tablet (0.25 mg total) by mouth 2 (two) times daily as needed for anxiety. 60 tablet 3  . anastrozole (ARIMIDEX) 1 MG tablet Take 1 tablet (1 mg total) by mouth daily. 90 tablet 1  . Aspirin-Acetaminophen-Caffeine (EXCEDRIN EXTRA STRENGTH PO) Take 2 tablets by mouth 2 (two) times daily as needed (for arm and neck pain).     . cetirizine (ZYRTEC) 10 MG tablet Take 10 mg by mouth daily as needed for allergies.     . citalopram (CELEXA) 40 MG tablet Take 1 tablet (40 mg total) by mouth at bedtime. 90 tablet 1  . escitalopram (LEXAPRO) 20 MG tablet Take 1 tablet (20 mg total) by mouth daily. 30 tablet 3  . fluticasone (FLONASE) 50 MCG/ACT nasal spray Place 2 sprays into both nostrils daily as needed for allergies. 60 mL 1  . Fluticasone-Salmeterol (ADVAIR) 250-50 MCG/DOSE AEPB Inhale 1 puff into the lungs 2 (two) times daily. 60 each 3  . folic acid (FOLVITE) 751 MCG tablet Take 1,600 mcg by mouth daily.    . methotrexate (RHEUMATREX) 2.5 MG tablet Take 20 mg by mouth every Wednesday.     . montelukast (SINGULAIR) 10 MG tablet Take 1 tablet (10 mg total) by mouth at bedtime. 90 tablet 1   . Olopatadine HCl 0.2 % SOLN Place 1 drop into both eyes daily as needed (allergies).    . Omeprazole 20 MG TBEC Take 20 mg by mouth every morning.     . potassium chloride (KLOR-CON) 10 MEQ tablet Take 1 tablet (10 mEq total) by mouth daily. 30 tablet 3  . simvastatin (ZOCOR) 40 MG tablet Take 1 tablet (40 mg total) by mouth at bedtime. 90 tablet 3   No current facility-administered medications for this visit.    OBJECTIVE: Vitals:   02/19/20 1013  BP: (!) 157/79  Pulse: 66  Resp: 18  Temp: (!) 97.3 F (36.3 C)     Body mass index is 23.53 kg/m.    ECOG FS:0 - Asymptomatic  General: Well-developed,  well-nourished, no acute distress. Eyes: Pink conjunctiva, anicteric sclera. HEENT: Normocephalic, moist mucous membranes. Lungs: No audible wheezing or coughing. Heart: Regular rate and rhythm. Abdomen: Soft, nontender, no obvious distention. Musculoskeletal: No edema, cyanosis, or clubbing. Neuro: Alert, answering all questions appropriately. Cranial nerves grossly intact. Skin: No rashes or petechiae noted. Psych: Normal affect.   LAB RESULTS:  Lab Results  Component Value Date   NA 146 (H) 09/12/2019   K 3.9 09/12/2019   CL 106 09/12/2019   CO2 21 09/12/2019   GLUCOSE 99 09/12/2019   BUN 11 09/12/2019   CREATININE 0.69 09/12/2019   CALCIUM 9.1 09/12/2019   PROT 6.6 05/24/2019   ALBUMIN 4.3 05/24/2019   AST 17 05/24/2019   ALT 14 05/24/2019   ALKPHOS 80 05/24/2019   BILITOT 0.3 05/24/2019   GFRNONAA 94 09/12/2019   GFRAA 108 09/12/2019    Lab Results  Component Value Date   WBC 7.7 05/24/2019   NEUTROABS 13.6 (H) 02/14/2018   HGB 12.9 08/07/2019   HCT 38.0 08/07/2019   MCV 94 05/24/2019   PLT 223 05/24/2019     STUDIES: MM DIAG BREAST TOMO BILATERAL  Result Date: 02/04/2020 CLINICAL DATA:  64 year old patient with history right breast cancer diagnosed in 2019 presents for routine annual examination. Two thousand twenty she had a stereotactic biopsy  of right breast calcifications with benign pathology results. EXAM: DIGITAL DIAGNOSTIC BILATERAL MAMMOGRAM WITH CAD AND TOMO COMPARISON:  Previous exam(s). ACR Breast Density Category d: The breast tissue is extremely dense, which lowers the sensitivity of mammography. FINDINGS: There are postsurgical changes in the posterior third of the upper outer right breast, with a postoperative seroma again noted. No mass, nonsurgical distortion, or suspicious microcalcification is identified in either breast to suggest malignancy. Slight right breast skin thickening consistent with history of radiation therapy. Mammographic images were processed with CAD. IMPRESSION: No evidence of malignancy in either breast. Lumpectomy and radiation changes of the right breast. RECOMMENDATION: Diagnostic mammogram is suggested in 1 year. (Code:[id number]-B-01Y) I have discussed the findings and recommendations with the patient. If applicable, a reminder letter will be sent to the patient regarding the next appointment. BI-RADS CATEGORY  2: Benign. Electronically Signed   By: Curlene Dolphin M.D.   On: 02/04/2020 10:44    ASSESSMENT: DCIS, right breast.  PLAN:    1. DCIS, right breast:Patient underwent lumpectomy followed by adjuvant XRT which she completed in approximately September 2019. Because there was no invasive component on her pathology, she did not require adjuvant chemotherapy.    Patient could not tolerate tamoxifen or letrozole secondary to worsening joint pain. Although she continues to have significant joint pain, it is unchanged since initiating anastrozole. Continue treatment for a total of 5 years completing treatment in September 2024.  Her most recent mammogram on Feb 04, 2020 was reported as BI-RADS 2.  Repeat in May 2022.  Return to clinic in 6 months with video assisted telemedicine visit.   2.  Osteopenia: Patient had a bone mineral density on November 01, 2018 which reported T score of -1.4.  Continue calcium and  vitamin D supplementation.  Patient missed her most recently scheduled bone mineral density appointment, therefore will reschedule in the next 1 to 2 weeks. 3.  Hypokalemia: Resolved.  I spent a total of 20 minutes reviewing chart data, face-to-face evaluation with the patient, counseling and coordination of care as detailed above.   I discussed the assessment and treatment plan with the patient. The patient was provided  an opportunity to ask questions and all were answered. The patient agreed with the plan and demonstrated an understanding of the instructions.   The patient was advised to call back or seek an in-person evaluation if the symptoms worsen or if the condition fails to improve as anticipated.    Lloyd Huger, MD 02/20/2020 9:17 AM  Cancer Staging Ductal carcinoma in situ (DCIS) of right breast Staging form: Breast, AJCC 8th Edition - Clinical: Stage 0 (cTis (DCIS), cN0, cM0, ER+, PR-, HER2-) - Signed by Lloyd Huger, MD on 03/18/2018

## 2020-02-18 ENCOUNTER — Encounter: Payer: Self-pay | Admitting: Oncology

## 2020-02-18 DIAGNOSIS — M4180 Other forms of scoliosis, site unspecified: Secondary | ICD-10-CM | POA: Diagnosis not present

## 2020-02-18 DIAGNOSIS — G8929 Other chronic pain: Secondary | ICD-10-CM | POA: Diagnosis not present

## 2020-02-18 DIAGNOSIS — M6281 Muscle weakness (generalized): Secondary | ICD-10-CM | POA: Diagnosis not present

## 2020-02-18 DIAGNOSIS — M5442 Lumbago with sciatica, left side: Secondary | ICD-10-CM | POA: Diagnosis not present

## 2020-02-18 NOTE — Progress Notes (Signed)
Would like to discuss anastrozole and rheumatoid arthritis. Reports some back pain and rates pain at 1. Denies any other concerns at this time.

## 2020-02-19 ENCOUNTER — Other Ambulatory Visit: Payer: Self-pay

## 2020-02-19 ENCOUNTER — Inpatient Hospital Stay: Payer: Medicare HMO | Attending: Oncology | Admitting: Oncology

## 2020-02-19 VITALS — BP 157/79 | HR 66 | Temp 97.3°F | Resp 18 | Wt 141.4 lb

## 2020-02-19 DIAGNOSIS — J449 Chronic obstructive pulmonary disease, unspecified: Secondary | ICD-10-CM | POA: Insufficient documentation

## 2020-02-19 DIAGNOSIS — Z923 Personal history of irradiation: Secondary | ICD-10-CM | POA: Insufficient documentation

## 2020-02-19 DIAGNOSIS — Z79899 Other long term (current) drug therapy: Secondary | ICD-10-CM | POA: Diagnosis not present

## 2020-02-19 DIAGNOSIS — M069 Rheumatoid arthritis, unspecified: Secondary | ICD-10-CM | POA: Insufficient documentation

## 2020-02-19 DIAGNOSIS — K219 Gastro-esophageal reflux disease without esophagitis: Secondary | ICD-10-CM | POA: Insufficient documentation

## 2020-02-19 DIAGNOSIS — I252 Old myocardial infarction: Secondary | ICD-10-CM | POA: Insufficient documentation

## 2020-02-19 DIAGNOSIS — M8580 Other specified disorders of bone density and structure, unspecified site: Secondary | ICD-10-CM | POA: Insufficient documentation

## 2020-02-19 DIAGNOSIS — Z79811 Long term (current) use of aromatase inhibitors: Secondary | ICD-10-CM | POA: Diagnosis not present

## 2020-02-19 DIAGNOSIS — F419 Anxiety disorder, unspecified: Secondary | ICD-10-CM | POA: Insufficient documentation

## 2020-02-19 DIAGNOSIS — D0511 Intraductal carcinoma in situ of right breast: Secondary | ICD-10-CM | POA: Diagnosis not present

## 2020-02-19 DIAGNOSIS — Z87891 Personal history of nicotine dependence: Secondary | ICD-10-CM | POA: Insufficient documentation

## 2020-02-20 ENCOUNTER — Ambulatory Visit: Payer: Medicare HMO | Admitting: Oncology

## 2020-02-25 DIAGNOSIS — M6281 Muscle weakness (generalized): Secondary | ICD-10-CM | POA: Diagnosis not present

## 2020-02-25 DIAGNOSIS — G8929 Other chronic pain: Secondary | ICD-10-CM | POA: Diagnosis not present

## 2020-02-25 DIAGNOSIS — M5442 Lumbago with sciatica, left side: Secondary | ICD-10-CM | POA: Diagnosis not present

## 2020-02-25 DIAGNOSIS — M4180 Other forms of scoliosis, site unspecified: Secondary | ICD-10-CM | POA: Diagnosis not present

## 2020-03-03 ENCOUNTER — Ambulatory Visit
Admission: RE | Admit: 2020-03-03 | Discharge: 2020-03-03 | Disposition: A | Discharge status: Home / Self Care | Payer: Medicare HMO | Source: Ambulatory Visit | Attending: Oncology | Admitting: Oncology

## 2020-03-03 DIAGNOSIS — D0511 Intraductal carcinoma in situ of right breast: Secondary | ICD-10-CM | POA: Insufficient documentation

## 2020-03-03 DIAGNOSIS — M4180 Other forms of scoliosis, site unspecified: Secondary | ICD-10-CM | POA: Diagnosis not present

## 2020-03-03 DIAGNOSIS — Z780 Asymptomatic menopausal state: Secondary | ICD-10-CM | POA: Diagnosis not present

## 2020-03-03 DIAGNOSIS — M8589 Other specified disorders of bone density and structure, multiple sites: Secondary | ICD-10-CM | POA: Diagnosis not present

## 2020-03-09 ENCOUNTER — Other Ambulatory Visit: Payer: Self-pay | Admitting: Neurosurgery

## 2020-03-20 ENCOUNTER — Other Ambulatory Visit: Payer: Self-pay | Admitting: Nurse Practitioner

## 2020-03-20 DIAGNOSIS — Z0001 Encounter for general adult medical examination with abnormal findings: Secondary | ICD-10-CM | POA: Diagnosis not present

## 2020-03-20 DIAGNOSIS — E559 Vitamin D deficiency, unspecified: Secondary | ICD-10-CM | POA: Diagnosis not present

## 2020-03-20 DIAGNOSIS — Z118 Encounter for screening for other infectious and parasitic diseases: Secondary | ICD-10-CM | POA: Diagnosis not present

## 2020-03-20 DIAGNOSIS — I10 Essential (primary) hypertension: Secondary | ICD-10-CM | POA: Diagnosis not present

## 2020-03-20 DIAGNOSIS — E782 Mixed hyperlipidemia: Secondary | ICD-10-CM | POA: Diagnosis not present

## 2020-03-21 LAB — CBC
Hematocrit: 44.1 % (ref 34.0–46.6)
Hemoglobin: 15.1 g/dL (ref 11.1–15.9)
MCH: 31.9 pg (ref 26.6–33.0)
MCHC: 34.2 g/dL (ref 31.5–35.7)
MCV: 93 fL (ref 79–97)
Platelets: 281 10*3/uL (ref 150–450)
RBC: 4.73 x10E6/uL (ref 3.77–5.28)
RDW: 12.7 % (ref 11.7–15.4)
WBC: 7.1 10*3/uL (ref 3.4–10.8)

## 2020-03-21 LAB — COMPREHENSIVE METABOLIC PANEL
ALT: 10 IU/L (ref 0–32)
AST: 21 IU/L (ref 0–40)
Albumin/Globulin Ratio: 1.8 (ref 1.2–2.2)
Albumin: 4.3 g/dL (ref 3.8–4.8)
Alkaline Phosphatase: 112 IU/L (ref 48–121)
BUN/Creatinine Ratio: 13 (ref 12–28)
BUN: 9 mg/dL (ref 8–27)
Bilirubin Total: 0.2 mg/dL (ref 0.0–1.2)
CO2: 25 mmol/L (ref 20–29)
Calcium: 9.5 mg/dL (ref 8.7–10.3)
Chloride: 102 mmol/L (ref 96–106)
Creatinine, Ser: 0.72 mg/dL (ref 0.57–1.00)
GFR calc Af Amer: 103 mL/min/{1.73_m2} (ref >59)
GFR calc non Af Amer: 89 mL/min/{1.73_m2} (ref >59)
Globulin, Total: 2.4 g/dL (ref 1.5–4.5)
Glucose: 85 mg/dL (ref 65–99)
Potassium: 4.4 mmol/L (ref 3.5–5.2)
Sodium: 140 mmol/L (ref 134–144)
Total Protein: 6.7 g/dL (ref 6.0–8.5)

## 2020-03-21 LAB — VITAMIN D 25 HYDROXY (VIT D DEFICIENCY, FRACTURES): Vit D, 25-Hydroxy: 26.9 ng/mL — ABNORMAL LOW (ref 30.0–100.0)

## 2020-03-21 LAB — LIPID PANEL WITH LDL/HDL RATIO
Cholesterol, Total: 185 mg/dL (ref 100–199)
HDL: 53 mg/dL (ref >39)
LDL Chol Calc (NIH): 104 mg/dL — ABNORMAL HIGH (ref 0–99)
LDL/HDL Ratio: 2 ratio (ref 0.0–3.2)
Triglycerides: 161 mg/dL — ABNORMAL HIGH (ref 0–149)
VLDL Cholesterol Cal: 28 mg/dL (ref 5–40)

## 2020-03-21 LAB — T4, FREE: Free T4: 0.97 ng/dL (ref 0.82–1.77)

## 2020-03-21 LAB — TSH: TSH: 1.8 u[IU]/mL (ref 0.450–4.500)

## 2020-03-21 LAB — HCV AB W REFLEX TO QUANT PCR: HCV Ab: <0.1 s/co ratio (ref 0.0–0.9)

## 2020-03-21 LAB — HCV INTERPRETATION

## 2020-03-29 NOTE — Progress Notes (Signed)
Please let the patient know that labs look good. Thanks.

## 2020-04-01 ENCOUNTER — Other Ambulatory Visit: Payer: Self-pay

## 2020-04-01 ENCOUNTER — Encounter (HOSPITAL_COMMUNITY): Payer: Self-pay

## 2020-04-01 ENCOUNTER — Encounter (HOSPITAL_COMMUNITY)
Admission: RE | Admit: 2020-04-01 | Discharge: 2020-04-01 | Disposition: A | Discharge status: Home / Self Care | Payer: Medicare HMO | Source: Ambulatory Visit | Attending: Neurosurgery | Admitting: Neurosurgery

## 2020-04-01 DIAGNOSIS — Z01818 Encounter for other preprocedural examination: Secondary | ICD-10-CM | POA: Insufficient documentation

## 2020-04-01 HISTORY — DX: Atherosclerotic heart disease of native coronary artery without angina pectoris: I25.10

## 2020-04-01 LAB — CBC
HCT: 48.4 % — ABNORMAL HIGH (ref 36.0–46.0)
Hemoglobin: 15.5 g/dL — ABNORMAL HIGH (ref 12.0–15.0)
MCH: 31.8 pg (ref 26.0–34.0)
MCHC: 32 g/dL (ref 30.0–36.0)
MCV: 99.4 fL (ref 80.0–100.0)
Platelets: 226 10*3/uL (ref 150–400)
RBC: 4.87 MIL/uL (ref 3.87–5.11)
RDW: 13.5 % (ref 11.5–15.5)
WBC: 7.9 10*3/uL (ref 4.0–10.5)
nRBC: 0 % (ref 0.0–0.2)

## 2020-04-01 LAB — TYPE AND SCREEN
ABO/RH(D): O POS
Antibody Screen: NEGATIVE

## 2020-04-01 LAB — BASIC METABOLIC PANEL
Anion gap: 9 (ref 5–15)
BUN: 9 mg/dL (ref 8–23)
CO2: 28 mmol/L (ref 22–32)
Calcium: 9.3 mg/dL (ref 8.9–10.3)
Chloride: 103 mmol/L (ref 98–111)
Creatinine, Ser: 0.76 mg/dL (ref 0.44–1.00)
GFR calc Af Amer: >60 mL/min (ref >60)
GFR calc non Af Amer: >60 mL/min (ref >60)
Glucose, Bld: 101 mg/dL — ABNORMAL HIGH (ref 70–99)
Potassium: 4.2 mmol/L (ref 3.5–5.1)
Sodium: 140 mmol/L (ref 135–145)

## 2020-04-01 LAB — SURGICAL PCR SCREEN
MRSA, PCR: NEGATIVE
Staphylococcus aureus: NEGATIVE

## 2020-04-01 NOTE — Pre-Procedure Instructions (Signed)
Michelle Moore  04/01/2020      [facility] [address], [place] - [address] [place] [address] Phone: [phone] Fax: [place], [place] [address] Phone: [phone] Fax: [phone]    Your procedure is scheduled on July 28  Report to [facility] entrance A at 5:30 A.M.  Call this number if you have problems the morning of surgery:  [phone]   Remember:  Do not eat or drink after midnight.      Take these medicines the morning of surgery with A SIP OF WATER :               escitalopram (lexapro)              advair-bring to hospital              Omeprazole               tramadol               If needed:              Alprazolam (xanax)              Anastrozole (arimedex)              Cetirizine (zyrtec)              flonase              Eye drops             7 days prior to surgery STOP taking any Aspirin (unless otherwise instructed by your surgeon), Aleve, Naproxen, Ibuprofen, Motrin, Advil, Goody's, BC's, all herbal medications, fish oil, and all vitamins.    Do not wear jewelry, make-up or nail polish.  Do not wear lotions, powders, or perfumes, or deodorant.  Do not shave 48 hours prior to surgery.  Men may shave face and neck.  Do not bring valuables to the hospital.  [facility] is not responsible for any belongings or valuables.  Contacts, dentures or bridgework may not be worn into surgery.  Leave your suitcase in the car.  After surgery it may be brought to your room.  For patients admitted to the hospital, discharge time will be determined by your treatment team.  Patients discharged the day of surgery will not be allowed to drive home.    Special instructions:   [place]- Preparing For Surgery  Before surgery, you can play an important role. Because skin is not sterile, your skin needs to be as free of germs as possible. You can  reduce the number of germs on your skin by washing with CHG (chlorahexidine gluconate) Soap before surgery.  CHG is an antiseptic cleaner which kills germs and bonds with the skin to continue killing germs even after washing.    Oral Hygiene is also important to reduce your risk of infection.  Remember - BRUSH YOUR TEETH THE MORNING OF SURGERY WITH YOUR REGULAR TOOTHPASTE  Please do not use if you have an allergy to CHG or antibacterial soaps. If your skin becomes reddened/irritated stop using the CHG.  Do not shave (including legs and underarms) for at least 48 hours prior to first CHG shower. It is OK to shave your face.  Please follow these instructions carefully.   1. Shower the NIGHT BEFORE SURGERY and the  MORNING OF SURGERY with CHG.   2. If you chose to wash your hair, wash your hair first as usual with your normal shampoo.  3. After you shampoo, rinse your hair and body thoroughly to remove the shampoo.  4. Use CHG as you would any other liquid soap. You can apply CHG directly to the skin and wash gently with a scrungie or a clean washcloth.   5. Apply the CHG Soap to your body ONLY FROM THE NECK DOWN.  Do not use on open wounds or open sores. Avoid contact with your eyes, ears, mouth and genitals (private parts). Wash Face and genitals (private parts)  with your normal soap.  6. Wash thoroughly, paying special attention to the area where your surgery will be performed.  7. Thoroughly rinse your body with warm water from the neck down.  8. DO NOT shower/wash with your normal soap after using and rinsing off the CHG Soap.  9. Pat yourself dry with a CLEAN TOWEL.  10. Wear CLEAN PAJAMAS to bed the night before surgery, wear comfortable clothes the morning of surgery  11. Place CLEAN SHEETS on your bed the night of your first shower and DO NOT SLEEP WITH PETS.    Day of Surgery:  Do not apply any deodorants/lotions.  Please wear clean clothes to the hospital/surgery center.    Remember to brush your teeth WITH YOUR REGULAR TOOTHPASTE.    Please read over the following fact sheets that you were given. Coughing and Deep Breathing and Surgical Site Infection Prevention

## 2020-04-01 NOTE — Progress Notes (Signed)
PCP - Dr. Stephanie Coup @ [facility] Cardiologist -  Dr. Drema Dallas @ [facility]  PPM/ICD - denies   Chest x-ray -  na EKG - 08/01/19 Stress Test - 12/23/19 ECHO - 4/12/221 Cardiac Cath - 2011  Sleep Study -  > 5 yrs CPAP - yes  Fasting Blood Sugar - na   Blood Thinner Instructions: na Aspirin Instructions:  ERAS Protcol -na   COVID TEST-  04/06/20   Anesthesia review:  Ekg/cardiac hx.  Patient denies shortness of breath, fever, cough and chest pain at PAT appointment   All instructions explained to the patient, with a verbal understanding of the material. Patient agrees to go over the instructions while at home for a better understanding. Patient also instructed to self quarantine after being tested for COVID-19. The opportunity to ask questions was provided.

## 2020-04-02 NOTE — Progress Notes (Addendum)
Anesthesia Chart Review:   Follows with cardiology at [facility] Dr. Nehemiah Massed for history of CAD status post NSTEMI treated with stent to RCA in 2011.  She recently had stress echo April 2021 that showed normal LV systolic function with normal myocardial perfusion and no evidence of myocardial ischemia. Clearance from Dr. Nehemiah Massed states pt at low risj, can hold ASA 4 d preop. Copy on chart.  OSA on CPAP.  Follows with rheumatology at [facility] for history of RA, maintained on methotrexate.  Preop labs reviewed, unremarkable.   EKG 08/01/2019: Normal sinus rhythm rate 64. Nonspecific ST and T wave abnormality  Stress echo 12/23/2019 (Care Everywhere): INTERPRETATION  Normal Stress Echocardiogram.  EF 55%. NORMAL RIGHT VENTRICULAR SYSTOLIC FUNCTION  TRIVIAL REGURGITATION NOTED (See above)  NO VALVULAR STENOSIS NOTED   Wynonia Musty [facility] Short Stay Center/Anesthesiology Phone [phone] 04/06/2020 11:22 AM

## 2020-04-03 ENCOUNTER — Other Ambulatory Visit: Payer: Self-pay | Admitting: *Deleted

## 2020-04-03 MED ORDER — ANASTROZOLE 1 MG PO TABS: 1.0000 mg | ORAL_TABLET | Freq: Every day | ORAL | 1 refills | Status: DC

## 2020-04-06 ENCOUNTER — Other Ambulatory Visit (HOSPITAL_COMMUNITY)
Admission: RE | Admit: 2020-04-06 | Discharge: 2020-04-06 | Disposition: A | Discharge status: Home / Self Care | Payer: Medicare HMO | Source: Ambulatory Visit | Attending: Neurosurgery | Admitting: Neurosurgery

## 2020-04-06 DIAGNOSIS — M5126 Other intervertebral disc displacement, lumbar region: Secondary | ICD-10-CM | POA: Diagnosis not present

## 2020-04-06 DIAGNOSIS — F419 Anxiety disorder, unspecified: Secondary | ICD-10-CM | POA: Diagnosis present

## 2020-04-06 DIAGNOSIS — Z79891 Long term (current) use of opiate analgesic: Secondary | ICD-10-CM | POA: Diagnosis not present

## 2020-04-06 DIAGNOSIS — Z90710 Acquired absence of both cervix and uterus: Secondary | ICD-10-CM | POA: Diagnosis not present

## 2020-04-06 DIAGNOSIS — Z20822 Contact with and (suspected) exposure to covid-19: Secondary | ICD-10-CM | POA: Diagnosis present

## 2020-04-06 DIAGNOSIS — G4733 Obstructive sleep apnea (adult) (pediatric): Secondary | ICD-10-CM | POA: Diagnosis present

## 2020-04-06 DIAGNOSIS — K219 Gastro-esophageal reflux disease without esophagitis: Secondary | ICD-10-CM | POA: Diagnosis present

## 2020-04-06 DIAGNOSIS — I2510 Atherosclerotic heart disease of native coronary artery without angina pectoris: Secondary | ICD-10-CM | POA: Diagnosis present

## 2020-04-06 DIAGNOSIS — M5116 Intervertebral disc disorders with radiculopathy, lumbar region: Secondary | ICD-10-CM | POA: Diagnosis present

## 2020-04-06 DIAGNOSIS — Z803 Family history of malignant neoplasm of breast: Secondary | ICD-10-CM | POA: Diagnosis not present

## 2020-04-06 DIAGNOSIS — Z7951 Long term (current) use of inhaled steroids: Secondary | ICD-10-CM | POA: Diagnosis not present

## 2020-04-06 DIAGNOSIS — I252 Old myocardial infarction: Secondary | ICD-10-CM | POA: Diagnosis not present

## 2020-04-06 DIAGNOSIS — Z955 Presence of coronary angioplasty implant and graft: Secondary | ICD-10-CM | POA: Diagnosis not present

## 2020-04-06 DIAGNOSIS — Z923 Personal history of irradiation: Secondary | ICD-10-CM | POA: Diagnosis not present

## 2020-04-06 DIAGNOSIS — Z853 Personal history of malignant neoplasm of breast: Secondary | ICD-10-CM | POA: Diagnosis not present

## 2020-04-06 DIAGNOSIS — Z79899 Other long term (current) drug therapy: Secondary | ICD-10-CM | POA: Diagnosis not present

## 2020-04-06 DIAGNOSIS — J449 Chronic obstructive pulmonary disease, unspecified: Secondary | ICD-10-CM | POA: Diagnosis present

## 2020-04-06 DIAGNOSIS — M4186 Other forms of scoliosis, lumbar region: Secondary | ICD-10-CM | POA: Diagnosis present

## 2020-04-06 DIAGNOSIS — M48062 Spinal stenosis, lumbar region with neurogenic claudication: Secondary | ICD-10-CM | POA: Diagnosis present

## 2020-04-06 DIAGNOSIS — Z981 Arthrodesis status: Secondary | ICD-10-CM | POA: Diagnosis not present

## 2020-04-06 DIAGNOSIS — Z87891 Personal history of nicotine dependence: Secondary | ICD-10-CM | POA: Diagnosis not present

## 2020-04-06 DIAGNOSIS — I10 Essential (primary) hypertension: Secondary | ICD-10-CM | POA: Diagnosis present

## 2020-04-06 LAB — SARS CORONAVIRUS 2 (TAT 6-24 HRS): SARS Coronavirus 2: NEGATIVE

## 2020-04-06 NOTE — Anesthesia Preprocedure Evaluation (Addendum)
Anesthesia Evaluation  Patient identified by MRN, date of birth, ID band Patient awake    Reviewed: Allergy & Precautions, NPO status , Patient's Chart, lab work & pertinent test results  Airway Mallampati: II  TM Distance: <3 FB Neck ROM: Full    Dental no notable dental hx. (+) Dental Advisory Given, Chipped,    Pulmonary neg pulmonary ROS, shortness of breath and with exertion, sleep apnea and Continuous Positive Airway Pressure Ventilation , COPD, former smoker,    Pulmonary exam normal breath sounds clear to auscultation       Cardiovascular hypertension, + CAD, + Past MI and + Cardiac Stents  negative cardio ROS Normal cardiovascular exam Rhythm:Regular Rate:Normal     Neuro/Psych  Headaches, Seizures -,  negative neurological ROS  negative psych ROS   GI/Hepatic negative GI ROS, Neg liver ROS, GERD  Medicated,  Endo/Other  negative endocrine ROS  Renal/GU negative Renal ROS  negative genitourinary   Musculoskeletal negative musculoskeletal ROS (+) Arthritis , Osteoarthritis and Rheumatoid disorders,    Abdominal   Peds negative pediatric ROS (+)  Hematology negative hematology ROS (+)   Anesthesia Other Findings   Reproductive/Obstetrics negative OB ROS                          Anesthesia Physical Anesthesia Plan  ASA: III  Anesthesia Plan: General   Post-op Pain Management:    Induction: Intravenous  PONV Risk Score and Plan: 3 and Ondansetron, Treatment may vary due to age or medical condition, Droperidol and Midazolam  Airway Management Planned: Oral ETT and Video Laryngoscope Planned  Additional Equipment:   Intra-op Plan:   Post-operative Plan: Extubation in OR  Informed Consent:   Plan Discussed with: Anesthesiologist and CRNA  Anesthesia Plan Comments: (PAT note by Karoline Caldwell, PA-C: Follows with cardiology at [facility] Dr. Nehemiah Massed for history of CAD status post  NSTEMI treated with stent to RCA in 2011.  She recently had stress echo April 2021 that showed normal LV systolic function with normal myocardial perfusion and no evidence of myocardial ischemia. Clearance from Dr. Nehemiah Massed states pt at low risj, can hold ASA 4 d preop. Copy on chart.  OSA on CPAP.  Follows with rheumatology at [facility] for history of RA, maintained on methotrexate.  Preop labs reviewed, unremarkable.   EKG 08/01/2019: Normal sinus rhythm rate 64. Nonspecific ST and T wave abnormality  Stress echo 12/23/2019 (Care Everywhere): INTERPRETATION  Normal Stress Echocardiogram.  EF 55%. NORMAL RIGHT VENTRICULAR SYSTOLIC FUNCTION  TRIVIAL REGURGITATION NOTED (See above)  NO VALVULAR STENOSIS NOTED  )      Anesthesia Quick Evaluation

## 2020-04-07 ENCOUNTER — Encounter (HOSPITAL_COMMUNITY): Payer: Self-pay | Admitting: Neurosurgery

## 2020-04-08 ENCOUNTER — Inpatient Hospital Stay (HOSPITAL_COMMUNITY): Payer: Medicare HMO

## 2020-04-08 ENCOUNTER — Other Ambulatory Visit: Payer: Self-pay

## 2020-04-08 ENCOUNTER — Inpatient Hospital Stay (HOSPITAL_COMMUNITY): Payer: Medicare HMO | Admitting: Physician Assistant

## 2020-04-08 ENCOUNTER — Encounter (HOSPITAL_COMMUNITY): Payer: Self-pay | Admitting: Neurosurgery

## 2020-04-08 ENCOUNTER — Encounter (HOSPITAL_COMMUNITY)
Admission: RE | Disposition: A | Discharge status: Home / Self Care | Payer: Self-pay | Source: Home / Self Care | Attending: Neurosurgery

## 2020-04-08 ENCOUNTER — Inpatient Hospital Stay (HOSPITAL_COMMUNITY)
Admission: RE | Admit: 2020-04-08 | Discharge: 2020-04-09 | DRG: 455 | Disposition: A | Discharge status: Home / Self Care | Payer: Medicare HMO | Attending: Neurosurgery | Admitting: Neurosurgery

## 2020-04-08 ENCOUNTER — Inpatient Hospital Stay (HOSPITAL_COMMUNITY): Payer: Medicare HMO | Admitting: Anesthesiology

## 2020-04-08 DIAGNOSIS — J449 Chronic obstructive pulmonary disease, unspecified: Secondary | ICD-10-CM | POA: Diagnosis present

## 2020-04-08 DIAGNOSIS — Z923 Personal history of irradiation: Secondary | ICD-10-CM

## 2020-04-08 DIAGNOSIS — M5116 Intervertebral disc disorders with radiculopathy, lumbar region: Principal | ICD-10-CM | POA: Diagnosis present

## 2020-04-08 DIAGNOSIS — Z79899 Other long term (current) drug therapy: Secondary | ICD-10-CM | POA: Diagnosis not present

## 2020-04-08 DIAGNOSIS — Z981 Arthrodesis status: Secondary | ICD-10-CM

## 2020-04-08 DIAGNOSIS — Z20822 Contact with and (suspected) exposure to covid-19: Secondary | ICD-10-CM | POA: Diagnosis present

## 2020-04-08 DIAGNOSIS — I2510 Atherosclerotic heart disease of native coronary artery without angina pectoris: Secondary | ICD-10-CM | POA: Diagnosis present

## 2020-04-08 DIAGNOSIS — Z87891 Personal history of nicotine dependence: Secondary | ICD-10-CM

## 2020-04-08 DIAGNOSIS — Z90710 Acquired absence of both cervix and uterus: Secondary | ICD-10-CM

## 2020-04-08 DIAGNOSIS — M48062 Spinal stenosis, lumbar region with neurogenic claudication: Secondary | ICD-10-CM | POA: Diagnosis present

## 2020-04-08 DIAGNOSIS — Z803 Family history of malignant neoplasm of breast: Secondary | ICD-10-CM

## 2020-04-08 DIAGNOSIS — Z7951 Long term (current) use of inhaled steroids: Secondary | ICD-10-CM

## 2020-04-08 DIAGNOSIS — M4186 Other forms of scoliosis, lumbar region: Secondary | ICD-10-CM | POA: Diagnosis present

## 2020-04-08 DIAGNOSIS — M5126 Other intervertebral disc displacement, lumbar region: Secondary | ICD-10-CM | POA: Diagnosis present

## 2020-04-08 DIAGNOSIS — Z79891 Long term (current) use of opiate analgesic: Secondary | ICD-10-CM

## 2020-04-08 DIAGNOSIS — G4733 Obstructive sleep apnea (adult) (pediatric): Secondary | ICD-10-CM | POA: Diagnosis present

## 2020-04-08 DIAGNOSIS — I10 Essential (primary) hypertension: Secondary | ICD-10-CM | POA: Diagnosis present

## 2020-04-08 DIAGNOSIS — F419 Anxiety disorder, unspecified: Secondary | ICD-10-CM | POA: Diagnosis present

## 2020-04-08 DIAGNOSIS — Z955 Presence of coronary angioplasty implant and graft: Secondary | ICD-10-CM | POA: Diagnosis not present

## 2020-04-08 DIAGNOSIS — Z853 Personal history of malignant neoplasm of breast: Secondary | ICD-10-CM | POA: Diagnosis not present

## 2020-04-08 DIAGNOSIS — I252 Old myocardial infarction: Secondary | ICD-10-CM

## 2020-04-08 DIAGNOSIS — Z419 Encounter for procedure for purposes other than remedying health state, unspecified: Secondary | ICD-10-CM

## 2020-04-08 DIAGNOSIS — K219 Gastro-esophageal reflux disease without esophagitis: Secondary | ICD-10-CM | POA: Diagnosis present

## 2020-04-08 LAB — ABO/RH: ABO/RH(D): O POS

## 2020-04-08 SURGERY — POSTERIOR LUMBAR FUSION 2 LEVEL
Anesthesia: General

## 2020-04-08 MED ORDER — TRAMADOL HCL 50 MG PO TABS: 100.0000 mg | ORAL_TABLET | Freq: Four times a day (QID) | ORAL | Status: DC | PRN

## 2020-04-08 MED ORDER — OXYCODONE HCL 5 MG/5ML PO SOLN: 5.0000 mg | Freq: Once | ORAL | Status: AC | PRN

## 2020-04-08 MED ORDER — SODIUM CHLORIDE 0.9 % IV SOLN
250.0000 mL | INTRAVENOUS | Status: DC
  Administered 2020-04-08: 250 mL via INTRAVENOUS

## 2020-04-08 MED ORDER — ALPRAZOLAM 0.25 MG PO TABS: 0.2500 mg | ORAL_TABLET | Freq: Two times a day (BID) | ORAL | Status: DC | PRN

## 2020-04-08 MED ORDER — ACETAMINOPHEN 160 MG/5ML PO SOLN: 325.0000 mg | ORAL | Status: DC | PRN

## 2020-04-08 MED ORDER — OXYCODONE HCL 5 MG PO TABS: 5.0000 mg | ORAL_TABLET | ORAL | Status: DC | PRN

## 2020-04-08 MED ORDER — CEFAZOLIN SODIUM-DEXTROSE 2-4 GM/100ML-% IV SOLN
2.0000 g | Freq: Three times a day (TID) | INTRAVENOUS | Status: AC
  Administered 2020-04-08 – 2020-04-09 (×2): 2 g via INTRAVENOUS
  Filled 2020-04-08 (×2): qty 100

## 2020-04-08 MED ORDER — ROCURONIUM BROMIDE 10 MG/ML (PF) SYRINGE
PREFILLED_SYRINGE | INTRAVENOUS | Status: DC | PRN
  Administered 2020-04-08: 20 mg via INTRAVENOUS
  Administered 2020-04-08: 60 mg via INTRAVENOUS
  Administered 2020-04-08 (×2): 20 mg via INTRAVENOUS
  Administered 2020-04-08: 10 mg via INTRAVENOUS

## 2020-04-08 MED ORDER — SODIUM CHLORIDE 0.9% FLUSH
3.0000 mL | Freq: Two times a day (BID) | INTRAVENOUS | Status: DC
  Administered 2020-04-08 (×2): 3 mL via INTRAVENOUS

## 2020-04-08 MED ORDER — FOLIC ACID 1 MG PO TABS
1.0000 mg | ORAL_TABLET | Freq: Every day | ORAL | Status: DC
  Administered 2020-04-08 – 2020-04-09 (×2): 1 mg via ORAL
  Filled 2020-04-08 (×2): qty 1

## 2020-04-08 MED ORDER — ONDANSETRON HCL 4 MG/2ML IJ SOLN
INTRAMUSCULAR | Status: DC | PRN
  Administered 2020-04-08: 4 mg via INTRAVENOUS

## 2020-04-08 MED ORDER — BUPIVACAINE LIPOSOME 1.3 % IJ SUSP
INTRAMUSCULAR | Status: DC | PRN
  Administered 2020-04-08: 20 mL

## 2020-04-08 MED ORDER — FENTANYL CITRATE (PF) 100 MCG/2ML IJ SOLN
25.0000 ug | INTRAMUSCULAR | Status: DC | PRN
  Administered 2020-04-08 (×2): 50 ug via INTRAVENOUS

## 2020-04-08 MED ORDER — MIDAZOLAM HCL 2 MG/2ML IJ SOLN
INTRAMUSCULAR | Status: AC
  Filled 2020-04-08: qty 2

## 2020-04-08 MED ORDER — DOCUSATE SODIUM 100 MG PO CAPS
100.0000 mg | ORAL_CAPSULE | Freq: Two times a day (BID) | ORAL | Status: DC
  Administered 2020-04-08 – 2020-04-09 (×3): 100 mg via ORAL
  Filled 2020-04-08 (×3): qty 1

## 2020-04-08 MED ORDER — LIDOCAINE 2% (20 MG/ML) 5 ML SYRINGE
INTRAMUSCULAR | Status: AC
  Filled 2020-04-08: qty 5

## 2020-04-08 MED ORDER — ACETAMINOPHEN 325 MG PO TABS: 650.0000 mg | ORAL_TABLET | ORAL | Status: DC | PRN

## 2020-04-08 MED ORDER — DEXAMETHASONE SODIUM PHOSPHATE 10 MG/ML IJ SOLN
INTRAMUSCULAR | Status: DC | PRN
  Administered 2020-04-08: 10 mg via INTRAVENOUS

## 2020-04-08 MED ORDER — ACETAMINOPHEN 10 MG/ML IV SOLN
INTRAVENOUS | Status: AC
  Filled 2020-04-08: qty 100

## 2020-04-08 MED ORDER — MOMETASONE FURO-FORMOTEROL FUM 200-5 MCG/ACT IN AERO
2.0000 | INHALATION_SPRAY | Freq: Two times a day (BID) | RESPIRATORY_TRACT | Status: DC
  Filled 2020-04-08 (×2): qty 8.8

## 2020-04-08 MED ORDER — OXYCODONE HCL 5 MG PO TABS
5.0000 mg | ORAL_TABLET | Freq: Once | ORAL | Status: AC | PRN
  Administered 2020-04-08: 5 mg via ORAL

## 2020-04-08 MED ORDER — ORAL CARE MOUTH RINSE: 15.0000 mL | Freq: Once | OROMUCOSAL | Status: AC

## 2020-04-08 MED ORDER — LIDOCAINE 2% (20 MG/ML) 5 ML SYRINGE
INTRAMUSCULAR | Status: DC | PRN
  Administered 2020-04-08: 60 mg via INTRAVENOUS

## 2020-04-08 MED ORDER — SIMVASTATIN 20 MG PO TABS
40.0000 mg | ORAL_TABLET | Freq: Every day | ORAL | Status: DC
  Administered 2020-04-08: 40 mg via ORAL
  Filled 2020-04-08: qty 2

## 2020-04-08 MED ORDER — MEPERIDINE HCL 25 MG/ML IJ SOLN: 6.2500 mg | INTRAMUSCULAR | Status: DC | PRN

## 2020-04-08 MED ORDER — ACETAMINOPHEN 500 MG PO TABS
1000.0000 mg | ORAL_TABLET | Freq: Four times a day (QID) | ORAL | Status: DC
  Administered 2020-04-08 – 2020-04-09 (×3): 1000 mg via ORAL
  Filled 2020-04-08 (×3): qty 2

## 2020-04-08 MED ORDER — BISACODYL 10 MG RE SUPP: 10.0000 mg | Freq: Every day | RECTAL | Status: DC | PRN

## 2020-04-08 MED ORDER — SUGAMMADEX SODIUM 200 MG/2ML IV SOLN
INTRAVENOUS | Status: DC | PRN
  Administered 2020-04-08: 150 mg via INTRAVENOUS

## 2020-04-08 MED ORDER — LACTATED RINGERS IV SOLN
INTRAVENOUS | Status: DC | PRN
Start: 2020-04-08 — End: 2020-04-08

## 2020-04-08 MED ORDER — EPHEDRINE SULFATE-NACL 50-0.9 MG/10ML-% IV SOSY
PREFILLED_SYRINGE | INTRAVENOUS | Status: DC | PRN
  Administered 2020-04-08 (×2): 10 mg via INTRAVENOUS
  Administered 2020-04-08: 5 mg via INTRAVENOUS

## 2020-04-08 MED ORDER — CEFAZOLIN SODIUM-DEXTROSE 2-4 GM/100ML-% IV SOLN
2.0000 g | INTRAVENOUS | Status: AC
  Administered 2020-04-08 (×2): 2 g via INTRAVENOUS
  Filled 2020-04-08: qty 100

## 2020-04-08 MED ORDER — BUPIVACAINE-EPINEPHRINE (PF) 0.5% -1:200000 IJ SOLN
INTRAMUSCULAR | Status: DC | PRN
  Administered 2020-04-08: 10 mL via PERINEURAL

## 2020-04-08 MED ORDER — DEXAMETHASONE SODIUM PHOSPHATE 10 MG/ML IJ SOLN
INTRAMUSCULAR | Status: AC
  Filled 2020-04-08: qty 1

## 2020-04-08 MED ORDER — LORATADINE 10 MG PO TABS: 10.0000 mg | ORAL_TABLET | Freq: Every day | ORAL | Status: DC

## 2020-04-08 MED ORDER — MORPHINE SULFATE (PF) 4 MG/ML IV SOLN: 4.0000 mg | INTRAVENOUS | Status: DC | PRN

## 2020-04-08 MED ORDER — FLUTICASONE PROPIONATE 50 MCG/ACT NA SUSP
2.0000 | Freq: Every day | NASAL | Status: DC | PRN
  Filled 2020-04-08: qty 16

## 2020-04-08 MED ORDER — BUPIVACAINE LIPOSOME 1.3 % IJ SUSP
20.0000 mL | Freq: Once | INTRAMUSCULAR | Status: DC
  Filled 2020-04-08: qty 20

## 2020-04-08 MED ORDER — THROMBIN 5000 UNITS EX SOLR
OROMUCOSAL | Status: DC | PRN
  Administered 2020-04-08 (×2): 5 mL via TOPICAL

## 2020-04-08 MED ORDER — ONDANSETRON HCL 4 MG/2ML IJ SOLN: 4.0000 mg | Freq: Four times a day (QID) | INTRAMUSCULAR | Status: DC | PRN

## 2020-04-08 MED ORDER — BACITRACIN ZINC 500 UNIT/GM EX OINT
TOPICAL_OINTMENT | CUTANEOUS | Status: AC
  Filled 2020-04-08: qty 28.35

## 2020-04-08 MED ORDER — ACETAMINOPHEN 10 MG/ML IV SOLN
INTRAVENOUS | Status: DC | PRN
  Administered 2020-04-08: 1000 mg via INTRAVENOUS

## 2020-04-08 MED ORDER — 0.9 % SODIUM CHLORIDE (POUR BTL) OPTIME
TOPICAL | Status: DC | PRN
  Administered 2020-04-08: 1000 mL

## 2020-04-08 MED ORDER — PHENYLEPHRINE 40 MCG/ML (10ML) SYRINGE FOR IV PUSH (FOR BLOOD PRESSURE SUPPORT)
PREFILLED_SYRINGE | INTRAVENOUS | Status: AC
  Filled 2020-04-08: qty 10

## 2020-04-08 MED ORDER — OXYCODONE HCL 5 MG PO TABS
10.0000 mg | ORAL_TABLET | ORAL | Status: DC | PRN
  Administered 2020-04-08 – 2020-04-09 (×5): 10 mg via ORAL
  Filled 2020-04-08 (×5): qty 2

## 2020-04-08 MED ORDER — PHENOL 1.4 % MT LIQD: 1.0000 | OROMUCOSAL | Status: DC | PRN

## 2020-04-08 MED ORDER — ANASTROZOLE 1 MG PO TABS
1.0000 mg | ORAL_TABLET | Freq: Every day | ORAL | Status: DC
  Administered 2020-04-08: 1 mg via ORAL
  Filled 2020-04-08 (×3): qty 1

## 2020-04-08 MED ORDER — ONDANSETRON HCL 4 MG/2ML IJ SOLN: 4.0000 mg | Freq: Once | INTRAMUSCULAR | Status: DC | PRN

## 2020-04-08 MED ORDER — ROCURONIUM BROMIDE 10 MG/ML (PF) SYRINGE
PREFILLED_SYRINGE | INTRAVENOUS | Status: AC
  Filled 2020-04-08: qty 10

## 2020-04-08 MED ORDER — THROMBIN 20000 UNITS EX SOLR
CUTANEOUS | Status: DC | PRN
  Administered 2020-04-08: 20 mL via TOPICAL

## 2020-04-08 MED ORDER — MENTHOL 3 MG MT LOZG: 1.0000 | LOZENGE | OROMUCOSAL | Status: DC | PRN

## 2020-04-08 MED ORDER — EPHEDRINE 5 MG/ML INJ
INTRAVENOUS | Status: AC
  Filled 2020-04-08: qty 10

## 2020-04-08 MED ORDER — ONDANSETRON HCL 4 MG PO TABS: 4.0000 mg | ORAL_TABLET | Freq: Four times a day (QID) | ORAL | Status: DC | PRN

## 2020-04-08 MED ORDER — ZOLPIDEM TARTRATE 5 MG PO TABS: 5.0000 mg | ORAL_TABLET | Freq: Every evening | ORAL | Status: DC | PRN

## 2020-04-08 MED ORDER — OXYCODONE HCL 5 MG PO TABS
ORAL_TABLET | ORAL | Status: AC
  Filled 2020-04-08: qty 1

## 2020-04-08 MED ORDER — ESCITALOPRAM OXALATE 20 MG PO TABS
20.0000 mg | ORAL_TABLET | Freq: Every day | ORAL | Status: DC
  Administered 2020-04-09: 20 mg via ORAL
  Filled 2020-04-08 (×2): qty 1

## 2020-04-08 MED ORDER — PROPOFOL 10 MG/ML IV BOLUS
INTRAVENOUS | Status: DC | PRN
  Administered 2020-04-08: 100 mg via INTRAVENOUS

## 2020-04-08 MED ORDER — ROCURONIUM BROMIDE 10 MG/ML (PF) SYRINGE: PREFILLED_SYRINGE | INTRAVENOUS | Status: DC | PRN

## 2020-04-08 MED ORDER — LACTATED RINGERS IV SOLN: INTRAVENOUS | Status: DC

## 2020-04-08 MED ORDER — FENTANYL CITRATE (PF) 100 MCG/2ML IJ SOLN
INTRAMUSCULAR | Status: AC
  Filled 2020-04-08: qty 2

## 2020-04-08 MED ORDER — ACETAMINOPHEN 650 MG RE SUPP: 650.0000 mg | RECTAL | Status: DC | PRN

## 2020-04-08 MED ORDER — CHLORHEXIDINE GLUCONATE CLOTH 2 % EX PADS: 6.0000 | MEDICATED_PAD | Freq: Once | CUTANEOUS | Status: DC

## 2020-04-08 MED ORDER — PHENYLEPHRINE 40 MCG/ML (10ML) SYRINGE FOR IV PUSH (FOR BLOOD PRESSURE SUPPORT)
PREFILLED_SYRINGE | INTRAVENOUS | Status: DC | PRN
  Administered 2020-04-08: 120 ug via INTRAVENOUS
  Administered 2020-04-08: 80 ug via INTRAVENOUS
  Administered 2020-04-08: 40 ug via INTRAVENOUS
  Administered 2020-04-08 (×2): 80 ug via INTRAVENOUS

## 2020-04-08 MED ORDER — THROMBIN 20000 UNITS EX SOLR
CUTANEOUS | Status: AC
  Filled 2020-04-08: qty 20000

## 2020-04-08 MED ORDER — PROPOFOL 10 MG/ML IV BOLUS
INTRAVENOUS | Status: AC
  Filled 2020-04-08: qty 20

## 2020-04-08 MED ORDER — THROMBIN 5000 UNITS EX SOLR
CUTANEOUS | Status: AC
  Filled 2020-04-08: qty 5000

## 2020-04-08 MED ORDER — CYCLOBENZAPRINE HCL 10 MG PO TABS
10.0000 mg | ORAL_TABLET | Freq: Three times a day (TID) | ORAL | Status: DC | PRN
  Administered 2020-04-08 – 2020-04-09 (×2): 10 mg via ORAL
  Filled 2020-04-08 (×2): qty 1

## 2020-04-08 MED ORDER — PANTOPRAZOLE SODIUM 40 MG PO TBEC
40.0000 mg | DELAYED_RELEASE_TABLET | Freq: Every day | ORAL | Status: DC
  Administered 2020-04-09: 40 mg via ORAL
  Filled 2020-04-08: qty 1

## 2020-04-08 MED ORDER — MIDAZOLAM HCL 5 MG/5ML IJ SOLN
INTRAMUSCULAR | Status: DC | PRN
  Administered 2020-04-08: 2 mg via INTRAVENOUS

## 2020-04-08 MED ORDER — DEXMEDETOMIDINE (PRECEDEX) IN NS 20 MCG/5ML (4 MCG/ML) IV SYRINGE
PREFILLED_SYRINGE | INTRAVENOUS | Status: DC | PRN
  Administered 2020-04-08 (×2): 8 ug via INTRAVENOUS

## 2020-04-08 MED ORDER — CEFAZOLIN SODIUM 1 G IJ SOLR
INTRAMUSCULAR | Status: AC
  Filled 2020-04-08: qty 20

## 2020-04-08 MED ORDER — PHENYLEPHRINE HCL-NACL 10-0.9 MG/250ML-% IV SOLN
INTRAVENOUS | Status: DC | PRN
  Administered 2020-04-08: 20 ug/min via INTRAVENOUS

## 2020-04-08 MED ORDER — BACITRACIN ZINC 500 UNIT/GM EX OINT
TOPICAL_OINTMENT | CUTANEOUS | Status: DC | PRN
  Administered 2020-04-08: 1 via TOPICAL

## 2020-04-08 MED ORDER — ONDANSETRON HCL 4 MG/2ML IJ SOLN
INTRAMUSCULAR | Status: AC
  Filled 2020-04-08: qty 2

## 2020-04-08 MED ORDER — POTASSIUM CHLORIDE CRYS ER 20 MEQ PO TBCR
10.0000 meq | EXTENDED_RELEASE_TABLET | Freq: Every day | ORAL | Status: DC
  Administered 2020-04-08 – 2020-04-09 (×2): 10 meq via ORAL
  Filled 2020-04-08 (×2): qty 1

## 2020-04-08 MED ORDER — FENTANYL CITRATE (PF) 250 MCG/5ML IJ SOLN
INTRAMUSCULAR | Status: AC
  Filled 2020-04-08: qty 5

## 2020-04-08 MED ORDER — CHLORHEXIDINE GLUCONATE 0.12 % MT SOLN
15.0000 mL | Freq: Once | OROMUCOSAL | Status: AC
  Administered 2020-04-08: 15 mL via OROMUCOSAL
  Filled 2020-04-08: qty 15

## 2020-04-08 MED ORDER — FENTANYL CITRATE (PF) 100 MCG/2ML IJ SOLN
INTRAMUSCULAR | Status: DC | PRN
  Administered 2020-04-08: 100 ug via INTRAVENOUS
  Administered 2020-04-08 (×3): 50 ug via INTRAVENOUS

## 2020-04-08 MED ORDER — ACETAMINOPHEN 325 MG PO TABS: 325.0000 mg | ORAL_TABLET | ORAL | Status: DC | PRN

## 2020-04-08 MED ORDER — SODIUM CHLORIDE 0.9 % IV SOLN
INTRAVENOUS | Status: DC | PRN
  Administered 2020-04-08: 500 mL

## 2020-04-08 MED ORDER — DEXMEDETOMIDINE (PRECEDEX) IN NS 20 MCG/5ML (4 MCG/ML) IV SYRINGE
PREFILLED_SYRINGE | INTRAVENOUS | Status: AC
  Filled 2020-04-08: qty 5

## 2020-04-08 MED ORDER — OLOPATADINE HCL 0.1 % OP SOLN: 1.0000 [drp] | Freq: Two times a day (BID) | OPHTHALMIC | Status: DC

## 2020-04-08 MED ORDER — MONTELUKAST SODIUM 10 MG PO TABS
10.0000 mg | ORAL_TABLET | Freq: Every day | ORAL | Status: DC
  Administered 2020-04-09: 10 mg via ORAL
  Filled 2020-04-08 (×2): qty 1

## 2020-04-08 MED ORDER — BUPIVACAINE-EPINEPHRINE 0.5% -1:200000 IJ SOLN
INTRAMUSCULAR | Status: AC
  Filled 2020-04-08: qty 1

## 2020-04-08 MED ORDER — SODIUM CHLORIDE 0.9% FLUSH: 3.0000 mL | INTRAVENOUS | Status: DC | PRN

## 2020-04-08 SURGICAL SUPPLY — 70 items
BAG DECANTER FOR FLEXI CONT (MISCELLANEOUS) ×2 IMPLANT
BASKET BONE COLLECTION (BASKET) ×2 IMPLANT
BENZOIN TINCTURE PRP APPL 2/3 (GAUZE/BANDAGES/DRESSINGS) ×2 IMPLANT
BLADE CLIPPER SURG (BLADE) IMPLANT
BUR MATCHSTICK NEURO 3.0 LAGG (BURR) ×2 IMPLANT
BUR PRECISION FLUTE 6.0 (BURR) ×2 IMPLANT
CAGE ALTERA 10X31X9-13 15D (Cage) ×4 IMPLANT
CANISTER SUCT 3000ML PPV (MISCELLANEOUS) ×2 IMPLANT
CAP LOCK DLX THRD (Cap) ×12 IMPLANT
CARTRIDGE OIL MAESTRO DRILL (MISCELLANEOUS) ×1 IMPLANT
CNTNR URN SCR LID CUP LEK RST (MISCELLANEOUS) ×1 IMPLANT
CONN CROSS 6.35X38-50 (Connector) ×2 IMPLANT
CONNECTOR CROSS 6.35X38-50 (Connector) ×1 IMPLANT
CONT SPEC 4OZ STRL OR WHT (MISCELLANEOUS) ×1
COVER BACK TABLE 60X90IN (DRAPES) ×2 IMPLANT
COVER WAND RF STERILE (DRAPES) ×2 IMPLANT
DECANTER SPIKE VIAL GLASS SM (MISCELLANEOUS) ×2 IMPLANT
DERMABOND ADVANCED (GAUZE/BANDAGES/DRESSINGS) ×1
DERMABOND ADVANCED .7 DNX12 (GAUZE/BANDAGES/DRESSINGS) ×1 IMPLANT
DIFFUSER DRILL AIR PNEUMATIC (MISCELLANEOUS) ×2 IMPLANT
DRAPE C-ARM 42X72 X-RAY (DRAPES) ×4 IMPLANT
DRAPE HALF SHEET 40X57 (DRAPES) ×2 IMPLANT
DRAPE LAPAROTOMY 100X72X124 (DRAPES) ×2 IMPLANT
DRAPE SURG 17X23 STRL (DRAPES) ×8 IMPLANT
DRSG OPSITE POSTOP 4X6 (GAUZE/BANDAGES/DRESSINGS) ×2 IMPLANT
ELECT BLADE 4.0 EZ CLEAN MEGAD (MISCELLANEOUS) ×2
ELECT REM PT RETURN 9FT ADLT (ELECTROSURGICAL) ×2
ELECTRODE BLDE 4.0 EZ CLN MEGD (MISCELLANEOUS) ×1 IMPLANT
ELECTRODE REM PT RTRN 9FT ADLT (ELECTROSURGICAL) ×1 IMPLANT
GAUZE 4X4 16PLY RFD (DISPOSABLE) ×2 IMPLANT
GAUZE SPONGE 4X4 12PLY STRL (GAUZE/BANDAGES/DRESSINGS) ×2 IMPLANT
GLOVE BIO SURGEON STRL SZ8 (GLOVE) ×4 IMPLANT
GLOVE BIO SURGEON STRL SZ8.5 (GLOVE) ×4 IMPLANT
GLOVE BIOGEL PI IND STRL 7.5 (GLOVE) ×2 IMPLANT
GLOVE BIOGEL PI INDICATOR 7.5 (GLOVE) ×2
GLOVE EXAM NITRILE XL STR (GLOVE) IMPLANT
GLOVE SURG SS PI 7.0 STRL IVOR (GLOVE) ×4 IMPLANT
GOWN STRL REUS W/ TWL LRG LVL3 (GOWN DISPOSABLE) ×1 IMPLANT
GOWN STRL REUS W/ TWL XL LVL3 (GOWN DISPOSABLE) ×4 IMPLANT
GOWN STRL REUS W/TWL 2XL LVL3 (GOWN DISPOSABLE) IMPLANT
GOWN STRL REUS W/TWL LRG LVL3 (GOWN DISPOSABLE) ×1
GOWN STRL REUS W/TWL XL LVL3 (GOWN DISPOSABLE) ×4
HEMOSTAT POWDER KIT SURGIFOAM (HEMOSTASIS) ×4 IMPLANT
KIT BASIN OR (CUSTOM PROCEDURE TRAY) ×2 IMPLANT
KIT TURNOVER KIT B (KITS) ×2 IMPLANT
MILL MEDIUM DISP (BLADE) ×2 IMPLANT
NEEDLE HYPO 21X1.5 SAFETY (NEEDLE) ×2 IMPLANT
NEEDLE HYPO 22GX1.5 SAFETY (NEEDLE) ×2 IMPLANT
NS IRRIG 1000ML POUR BTL (IV SOLUTION) ×2 IMPLANT
OIL CARTRIDGE MAESTRO DRILL (MISCELLANEOUS) ×2
PACK LAMINECTOMY NEURO (CUSTOM PROCEDURE TRAY) ×2 IMPLANT
PAD ARMBOARD 7.5X6 YLW CONV (MISCELLANEOUS) ×6 IMPLANT
PATTIES SURGICAL .5 X.5 (GAUZE/BANDAGES/DRESSINGS) ×2 IMPLANT
PATTIES SURGICAL .5 X1 (DISPOSABLE) IMPLANT
PATTIES SURGICAL 1X1 (DISPOSABLE) ×2 IMPLANT
PUTTY DBM 10CC CALC GRAN (Putty) ×4 IMPLANT
ROD CURVED TI 6.35X70 (Rod) ×4 IMPLANT
SCREW PA DLX CREO 7.5X50 (Screw) ×12 IMPLANT
SPONGE LAP 4X18 RFD (DISPOSABLE) IMPLANT
SPONGE NEURO XRAY DETECT 1X3 (DISPOSABLE) ×2 IMPLANT
SPONGE SURGIFOAM ABS GEL 100 (HEMOSTASIS) ×2 IMPLANT
STRIP CLOSURE SKIN 1/2X4 (GAUZE/BANDAGES/DRESSINGS) ×2 IMPLANT
SUT VIC AB 1 CT1 18XBRD ANBCTR (SUTURE) ×2 IMPLANT
SUT VIC AB 1 CT1 8-18 (SUTURE) ×2
SUT VIC AB 2-0 CP2 18 (SUTURE) ×4 IMPLANT
SYR 20ML LL LF (SYRINGE) IMPLANT
TOWEL GREEN STERILE (TOWEL DISPOSABLE) ×2 IMPLANT
TOWEL GREEN STERILE FF (TOWEL DISPOSABLE) ×2 IMPLANT
TRAY FOLEY MTR SLVR 16FR STAT (SET/KITS/TRAYS/PACK) ×2 IMPLANT
WATER STERILE IRR 1000ML POUR (IV SOLUTION) ×2 IMPLANT

## 2020-04-08 NOTE — H&P (Signed)
Subjective: The patient is a 64 year old white female whose had previous back surgery.  She has complained of back and left great and right leg pain consistent with lumbar radiculopathy.  She has failed medical management.  She was worked up with lumbar x-rays and lumbar MRI which demonstrated an adult degenerative scoliosis with neural compression at L3-4 and L4-5.  I discussed the various treatment options with her including surgery.  She has decided proceed with a lumbar decompression, instrumentation and fusion.  Past Medical History:  Diagnosis Date  . Anxiety   . Arthritis   . Cancer ([place])    breast-right   . COPD (chronic obstructive pulmonary disease) ([place])   . Coronary artery disease   . Dyspnea    ONLY WITH EXERTION  . GERD (gastroesophageal reflux disease)    "sometimes"  . Headache(784.0)    sinus headaches  . Myocardial infarction ([place])    in 2011  no damage-1 stent  . Personal history of radiation therapy   . Seizures ([place]) 2014   X1-no idea what caused her seizure  . Sleep apnea    USES CPAP    Past Surgical History:  Procedure Laterality Date  . ABDOMINAL HYSTERECTOMY    . ANTERIOR CERVICAL DECOMP/DISCECTOMY FUSION N/A 01/20/2014   Procedure: ANTERIOR CERVICAL DECOMPRESSION/DISCECTOMY FUSION 2 LEVELS cervical five/six six [place]ry [place];  Surgeon: Ophelia Charter, MD;  Location: [place] NEURO ORS;  Service: Neurosurgery;  Laterality: N/A;  . BACK SURGERY     lumbar  . 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 BREAST BIOPSY  01/16/2019   coil, BENIGN MAMMARY TISSUE WITH MODERATE STROMAL FIBROSIS AND COARSE DYSTROPHIC CALCIFICATIONS of the RIGHT breast  . BREAST LUMPECTOMY Right 03/07/2018   Procedure: BREAST LUMPECTOMY/ RE EXCISION;  Surgeon: Herbert Pun, MD;  Location: [facility] ORS;  Service: General;  Laterality: Right;  . CARDIAC CATHETERIZATION     2011  . CORONARY ANGIOPLASTY WITH STENT PLACEMENT     PLACED IN RCA 2011  . EYE SURGERY     lasik  .  PARTIAL MASTECTOMY WITH NEEDLE LOCALIZATION Right 02/21/2018   Procedure: PARTIAL MASTECTOMY WITH NEEDLE LOCALIZATION;  Surgeon: Herbert Pun, MD;  Location: [facility] ORS;  Service: General;  Laterality: Right;  . ROTATOR CUFF REPAIR     left shoulder  . SHOULDER ARTHROSCOPY WITH ROTATOR CUFF REPAIR AND OPEN BICEPS TENODESIS Right 08/07/2019   Procedure: SHOULDER ARTHROSCOPY WITH DEBRIDEMENT, DECOMPRESSION, ROTATOR CUFF REPAIR AND BICEPS TENODESIS. - RNFA;  Surgeon: Corky Mull, MD;  Location: [facility] ORS;  Service: Orthopedics;  Laterality: Right;  . TOE SURGERY Right    has pin and plate in it    Allergies  Allergen Reactions  . Other     BANDAIDS-OF LEFT ON FOR AN EXTENDED PERIOD OF TIME    Social History   Tobacco Use  . Smoking status: Former Smoker    Packs/day: 1.00    Years: 30.00    Pack years: 30.00    Types: Cigarettes    Quit date: 09/13/2015    Years since quitting: 4.5  . Smokeless tobacco: Never Used  Substance Use Topics  . Alcohol use: Yes    Comment: occasional    Family History  Problem Relation Age of Onset  . Breast cancer Cousin 18       bilateral at 109 and 49; daughter of maternal aunt who was unaffected  . Lung cancer Maternal Aunt        dx 78s; deceased  22s; smoker  . Heart attack Father        deceased 21  . Stomach cancer Maternal Grandmother    Prior to Admission medications   Medication Sig Start Date End Date Taking? Authorizing Provider  ALPRAZolam (XANAX) 0.25 MG tablet Take 1 tablet (0.25 mg total) by mouth 2 (two) times daily as needed for anxiety. Patient taking differently: Take 0.25 mg by mouth 2 (two) times daily as needed for anxiety.  01/07/20  Yes Boscia, Greer Ee, NP  anastrozole (ARIMIDEX) 1 MG tablet Take 1 tablet (1 mg total) by mouth daily. 04/03/20  Yes Lloyd Huger, MD  Aspirin-Acetaminophen-Caffeine (EXCEDRIN EXTRA STRENGTH PO) Take 2 tablets by mouth 2 (two) times daily as needed (for arm and neck pain).    Yes  [provider]  escitalopram (LEXAPRO) 20 MG tablet Take 1 tablet (20 mg total) by mouth daily. 01/07/20  Yes Boscia, Greer Ee, NP  fluticasone (FLONASE) 50 MCG/ACT nasal spray Place 2 sprays into both nostrils daily as needed for allergies. 09/09/19  Yes Boscia, Greer Ee, NP  Fluticasone-Salmeterol (ADVAIR) 250-50 MCG/DOSE AEPB Inhale 1 puff into the lungs 2 (two) times daily. 10/01/19  Yes Boscia, Greer Ee, NP  folic acid (FOLVITE) 725 MCG tablet Take 1,600 mcg by mouth daily.   Yes [provider]  methotrexate (RHEUMATREX) 2.5 MG tablet Take 20 mg by mouth every Wednesday.  10/02/17  Yes [provider]  montelukast (SINGULAIR) 10 MG tablet Take 1 tablet (10 mg total) by mouth at bedtime. 01/14/20  Yes Boscia, Greer Ee, NP  Omeprazole 20 MG TBEC Take 20 mg by mouth every morning.    Yes [provider]  potassium chloride (KLOR-CON) 10 MEQ tablet Take 1 tablet (10 mEq total) by mouth daily. 02/12/20  Yes Boscia, Greer Ee, NP  simvastatin (ZOCOR) 40 MG tablet Take 1 tablet (40 mg total) by mouth at bedtime. 05/17/19  Yes Boscia, Greer Ee, NP  traMADol (ULTRAM) 50 MG tablet Take 100 mg by mouth 2 (two) times daily.   Yes [provider]  cetirizine (ZYRTEC) 10 MG tablet Take 10 mg by mouth daily as needed for allergies.     [provider]  citalopram (CELEXA) 40 MG tablet Take 1 tablet (40 mg total) by mouth at bedtime. Patient not taking: Reported on 03/25/2020 11/15/19   Ronnell Freshwater, NP  Olopatadine HCl 0.2 % SOLN Place 1 drop into both eyes daily as needed (allergies).    [provider]     Review of Systems  Positive ROS: As above  All other systems have been reviewed and were otherwise negative with the exception of those mentioned in the HPI and as above.  Objective: Vital signs in last 24 hours: Temp:  [98.2 F (36.8 C)] 98.2 F (36.8 C) (07/28 0617) Pulse Rate:  [60] 60 (07/28 0617) Resp:  [18] 18 (07/28  0617) BP: (148)/(54) 148/54 (07/28 0617) SpO2:  [92 %] 92 % (07/28 0617) Weight:  [62.6 kg] 62.6 kg (07/28 0617) Estimated body mass index is 23.69 kg/m as calculated from the following:   Height as of this encounter: 5\' 4"  (1.626 m).   Weight as of this encounter: 62.6 kg.   General Appearance: Alert Head: Normocephalic, without obvious abnormality, atraumatic Eyes: PERRL, conjunctiva/corneas clear, EOM's intact,    Ears: Normal  Throat: Normal  Neck: Supple, Back: Her lumbar incision is well-healed. Lungs: Clear to auscultation bilaterally, respirations unlabored Heart: Regular rate and rhythm, no murmur,  rub or gallop Abdomen: Soft, non-tender Extremities: Extremities normal, atraumatic, no cyanosis or edema Skin: unremarkable  NEUROLOGIC:   Mental status: alert and oriented,Motor Exam - grossly normal Sensory Exam - grossly normal Reflexes:  Coordination - grossly normal Gait - grossly normal Balance - grossly normal Cranial Nerves: I: smell Not tested  II: visual acuity  OS: Normal  OD: Normal   II: visual fields Full to confrontation  II: pupils Equal, round, reactive to light  III,VII: ptosis None  III,IV,VI: extraocular muscles  Full ROM  V: mastication Normal  V: facial light touch sensation  Normal  V,VII: corneal reflex  Present  VII: facial muscle function - upper  Normal  VII: facial muscle function - lower Normal  VIII: hearing Not tested  IX: soft palate elevation  Normal  IX,X: gag reflex Present  XI: trapezius strength  5/5  XI: sternocleidomastoid strength 5/5  XI: neck flexion strength  5/5  XII: tongue strength  Normal    Data Review Lab Results  Component Value Date   WBC 7.9 04/01/2020   HGB 15.5 (H) 04/01/2020   HCT 48.4 (H) 04/01/2020   MCV 99.4 04/01/2020   PLT 226 04/01/2020   Lab Results  Component Value Date   NA 140 04/01/2020   K 4.2 04/01/2020   CL 103 04/01/2020   CO2 28 04/01/2020   BUN 9 04/01/2020   CREATININE 0.76  04/01/2020   GLUCOSE 101 (H) 04/01/2020   No results found for: INR, PROTIME  Assessment/Plan: Degenerative scoliosis, lumbago, lumbar radiculopathy, recurrent hernia disc: I have discussed the situation with the patient.  I have reviewed her imaging studies with her and pointed out the abnormalities.  We have discussed the various treatment options including surgery.  I have described the surgical treatment option of an L3-4 and L4-5 decompression, instrumentation and fusion.  I have shown her surgical models.  I have given her a surgical pamphlet.  We have discussed the risk, benefits, alternatives, expected postop course, and likelihood of achieving our goals with surgery.  I have answered all her questions.  She has decided proceed with surgery.   Ophelia Charter 04/08/2020 7:22 AM

## 2020-04-08 NOTE — Progress Notes (Signed)
Subjective: The patient is alert and pleasant.  She is in no apparent distress.  She looks well.  Objective: Vital signs in last 24 hours: Temp:  [98.2 F (36.8 C)-100.4 F (38 C)] 100.4 F (38 C) (07/28 1230) Pulse Rate:  [60-83] 75 (07/28 1245) Resp:  [15-18] 15 (07/28 1245) BP: (101-148)/(53-57) 101/57 (07/28 1245) SpO2:  [92 %-99 %] 95 % (07/28 1245) Weight:  [62.6 kg] 62.6 kg (07/28 0617) Estimated body mass index is 23.69 kg/m as calculated from the following:   Height as of this encounter: 5\' 4"  (1.626 m).   Weight as of this encounter: 62.6 kg.   Intake/Output from previous day: No intake/output data recorded. Intake/Output this shift: Total I/O In: 1600 [I.V.:1500; IV Piggyback:100] Out: 750 [Urine:550; Blood:200]  Physical exam the patient is alert and pleasant.  She is moving her lower extremities well.  Lab Results: No results for input(s): WBC, HGB, HCT, PLT in the last 72 hours. BMET No results for input(s): NA, K, CL, CO2, GLUCOSE, BUN, CREATININE, CALCIUM in the last 72 hours.  Studies/Results: DG Lumbar Spine 2-3 Views  Result Date: 04/08/2020 CLINICAL DATA:  Provided history: L3-5 PLIF, reported fluoroscopy time 25 seconds, 17.94 mGy EXAM: LUMBAR SPINE - 2-3 VIEW COMPARISON:  Lumbar spine radiographs performed earlier the same day 04/08/2020 FINDINGS: PA and lateral view intraoperative fluoroscopic images of the lumbar spine are submitted, 3 images total. The images demonstrate bilateral pedicle screws at the L3, L4 and L5 levels as well as L3-L4 and L4-L5 interbody spacers. Retractors project posterior to the surgical levels. IMPRESSION: Three intraoperative fluoroscopic images of the lumbar spine as described. Electronically Signed   By: Kellie Simmering DO   On: 04/08/2020 12:41   DG Lumbar Spine 1 View  Addendum Date: 04/08/2020   ADDENDUM REPORT: 04/08/2020 12:40 ADDENDUM: This report should be for the lumbar spine single view, accession [id number]. The  C-arm accession, [id number], should be linked to the lumbar spine 2-3 view, accession, [id number]. Electronically Signed   By: Lajean Manes M.D.   On: 04/08/2020 12:40   Result Date: 04/08/2020 CLINICAL DATA:  Portable operative localization imaging for lumbar spine surgery.n EXAM: LUMBAR SPINE - 1 VIEW; DG C-ARM 1-60 MIN COMPARISON:  Lumbar MRI, 01/03/2020. FINDINGS: Single portable lateral view of the lumbar spine shows a surgical probe with its tip projecting 2.4 cm posterior the posterior upper corner of the L5 vertebra. IMPRESSION: Surgical localization imaging as described. Electronically Signed: By: Lajean Manes M.D. On: 04/08/2020 09:49   DG C-Arm 1-60 Min  Addendum Date: 04/08/2020   ADDENDUM REPORT: 04/08/2020 12:40 ADDENDUM: This report should be for the lumbar spine single view, accession [id number]. The C-arm accession, [id number], should be linked to the lumbar spine 2-3 view, accession, [id number]. Electronically Signed   By: Lajean Manes M.D.   On: 04/08/2020 12:40   Result Date: 04/08/2020 CLINICAL DATA:  Portable operative localization imaging for lumbar spine surgery.n EXAM: LUMBAR SPINE - 1 VIEW; DG C-ARM 1-60 MIN COMPARISON:  Lumbar MRI, 01/03/2020. FINDINGS: Single portable lateral view of the lumbar spine shows a surgical probe with its tip projecting 2.4 cm posterior the posterior upper corner of the L5 vertebra. IMPRESSION: Surgical localization imaging as described. Electronically Signed: By: Lajean Manes M.D. On: 04/08/2020 09:49    Assessment/Plan: The patient is doing well.  I spoke with her husband.  LOS: 0 days     Michelle Moore 04/08/2020, 1:05 PM

## 2020-04-08 NOTE — Op Note (Signed)
Brief history: The patient is a 64 year old white female who has had a previous lumbar discectomy.  She has developed recurrent back and left great and right leg pain consistent with lumbar radiculopathy/neurogenic claudication.  She failed medical management and was worked up with a lumbar MRI and lumbar x-rays which demonstrated recurrent herniated disc at L3-4 and L4-5.  I discussed the various treatment options with her.  She has weighed the risks, benefits and alternatives of surgery and decided proceed with an L3-4 and L4-5 decompression, instrumentation and fusion.  Preoperative diagnosis: Adult degenerative scoliosis, L3-4 and L4-5 degenerative disc disease, recurrent hernia disc, spinal stenosis ; lumbar radiculopathy; neurogenic claudication  Postoperative diagnosis: The same  Procedure: Bilateral L3-4 and L4-5 laminotomy/foraminotomies/medial facetectomy to decompress the bilateral L3, L4 and L5 nerve roots(the work required to do this was in addition to the work required to do the posterior lumbar interbody fusion because of the patient's recurrent herniated disc, spinal stenosis, facet arthropathy. Etc. requiring a wide decompression of the nerve roots.);  L3-4 and L4-5 transforaminal lumbar interbody fusion with local morselized autograft bone and Zimmer DBM; insertion of interbody prosthesis at L3-4 and L4-5 (globus peek expandable interbody prosthesis); posterior segmental instrumentation from L3 to L5 with globus titanium pedicle screws and rods; posterior lateral arthrodesis at L3-4 and L4-5 with local morselized autograft bone and Zimmer DBM.  Surgeon: Dr. Earle Gell  Asst.: Arnetha Massy, NP  Anesthesia: Gen. endotracheal  Estimated blood loss: 250 cc  Drains: None  Complications: None  Description of procedure: The patient was brought to the operating room by the anesthesia team. General endotracheal anesthesia was induced. The patient was turned to the prone position on  the Wilson frame. The patient's lumbosacral region was then prepared with Betadine scrub and Betadine solution. Sterile drapes were applied.  I then injected the area to be incised with Marcaine with epinephrine solution. I then used the scalpel to make a linear midline incision over the L3-4 and L4-5 interspace, incising through the old surgical scar. I then used electrocautery to perform a bilateral subperiosteal dissection exposing the spinous process and lamina of L3, L4 and L5. We then obtained intraoperative radiograph to confirm our location. We then inserted the Verstrac retractor to provide exposure.  I began the decompression by using the high speed drill to perform laminotomies at L3-4 and L4-5 bilaterally.  We encountered epidural scar tissue as expected and L4-5 on the left from her previous discectomy. We then used the Kerrison punches to widen the laminotomy and removed the ligamentum flavum at L3-4 and L4-5 bilaterally and remove the epidural scar tissue at L4-5 on the left. We used the Kerrison punches to remove the medial facets at L3-4 and L4-5 bilaterally. We performed wide foraminotomies about the bilateral L3, L4 and L5 nerve roots completing the decompression.  We now turned our attention to the posterior lumbar interbody fusion. I used a scalpel to incise the intervertebral disc at L3-4 and L4-5 bilaterally. I then performed a partial intervertebral discectomy at L3-4 and L4-5 bilaterally using the pituitary forceps. We prepared the vertebral endplates at B1-4 and N8-2 bilaterally for the fusion by removing the soft tissues with the curettes. We then used the trial spacers to pick the appropriate sized interbody prosthesis. We prefilled his prosthesis with a combination of local morselized autograft bone that we obtained during the decompression as well as Zimmer DBM. We inserted the prefilled prosthesis into the interspace at L3-4 and L4-5 from the left, we then  turned and expanded the  prosthesis. There was a good snug fit of the prosthesis in the interspace. We then filled and the remainder of the intervertebral disc space with local morselized autograft bone and Zimmer DBM. This completed the posterior lumbar interbody arthrodesis.  During the decompression and insertion of the prosthesis the assistant protected the thecal sac and nerve roots with the D'Errico retractor.  We now turned attention to the instrumentation. Under fluoroscopic guidance we cannulated the bilateral L3, L4 and L5 pedicles with the bone probe. We then removed the bone probe. We then tapped the pedicle with a 6.5 millimeter tap. We then removed the tap. We probed inside the tapped pedicle with a ball probe to rule out cortical breaches. We then inserted a 7.5 x 50 millimeter pedicle screw into the L3, L4 and L5 pedicles bilaterally under fluoroscopic guidance. We then palpated along the medial aspect of the pedicles to rule out cortical breaches. There were none. The nerve roots were not injured. We then connected the unilateral pedicle screws with a lordotic rod. We compressed the construct and secured the rod in place with the caps. We then tightened the caps appropriately. This completed the instrumentation from L3-4 and L4-5 bilaterally.  We now turned our attention to the posterior lateral arthrodesis at L3-4 and L4-5 bilaterally. We used the high-speed drill to decorticate the remainder of the facets, pars, transverse process at L3-4 and L4-5 bilaterally. We then applied a combination of local morselized autograft bone and Zimmer DBM over these decorticated posterior lateral structures. This completed the posterior lateral arthrodesis at L3-4 and L4-5.  We then obtained hemostasis using bipolar electrocautery. We irrigated the wound out with bacitracin solution. We inspected the thecal sac and nerve roots and noted they were well decompressed. We then removed the retractor.  We injected Exparel . We  reapproximated patient's thoracolumbar fascia with interrupted #1 Vicryl suture. We reapproximated patient's subcutaneous tissue with interrupted 2-0 Vicryl suture. The reapproximated patient's skin with Steri-Strips and benzoin. The wound was then coated with bacitracin ointment. A sterile dressing was applied. The drapes were removed. The patient was subsequently returned to the supine position where they were extubated by the anesthesia team. He was then transported to the post anesthesia care unit in stable condition. All sponge instrument and needle counts were reportedly correct at the end of this case.

## 2020-04-08 NOTE — Progress Notes (Signed)
Orthopedic Tech Progress Note Patient Details:  ADIN LARICCIA 1956/06/08 [id number] Called in order to [facility] for an [place] Patient ID: JANIYLA LONG, female   DOB: Feb 18, 1956, 64 y.o.   MRN: [id number]   [name] 04/08/2020, 4:12 PM

## 2020-04-08 NOTE — Progress Notes (Signed)
Pt. Given Advair medication. Pt. Brought medication from home.

## 2020-04-08 NOTE — Transfer of Care (Signed)
Immediate Anesthesia Transfer of Care Note  Patient: Michelle Moore  Procedure(s) Performed: POSTERIOR LUMBAR INTERBODY FUSION, LAMINECTOMY, POSTERIOR INSTRUMENTATION LUMBAR THREE- LUMBAR FOUR, LUMBAR FOUR- LUMBAR FIVE (N/A )  Patient Location: PACU  Anesthesia Type:General  Level of Consciousness: awake, oriented and patient cooperative  Airway & Oxygen Therapy: Patient Spontanous Breathing and Patient connected to nasal cannula oxygen  Post-op Assessment: Report given to RN and Post -op Vital signs reviewed and stable  Post vital signs: Reviewed  Last Vitals:  Vitals Value Taken Time  BP 118/53 04/08/20 1230  Temp 38 C 04/08/20 1230  Pulse 78 04/08/20 1235  Resp 17 04/08/20 1235  SpO2 90 % 04/08/20 1235  Vitals shown include unvalidated device data.  Last Pain:  Vitals:   04/08/20 1230  TempSrc:   PainSc: (P) 6       Patients Stated Pain Goal: (P) 4 (38/88/28 0034)  Complications: No complications documented.

## 2020-04-08 NOTE — Progress Notes (Signed)
Orthopedic Tech Progress Note Patient Details:  Michelle Moore November 08, 1955 [id number] RN said patient has brace Patient ID: [name], female   DOB: [birth year], 64 y.o.   MRN: [id number]   [name] 04/08/2020, 3:16 PM

## 2020-04-08 NOTE — Anesthesia Procedure Notes (Signed)
Procedure Name: Intubation Date/Time: 04/08/2020 7:41 AM Performed by: Jenne Campus, CRNA Pre-anesthesia Checklist: Patient identified, Emergency Drugs available, Suction available and Patient being monitored Patient Re-evaluated:Patient Re-evaluated prior to induction Oxygen Delivery Method: Circle System Utilized Preoxygenation: Pre-oxygenation with 100% oxygen Induction Type: IV induction Ventilation: Mask ventilation without difficulty Laryngoscope Size: Glidescope and 3 Grade View: Grade I Tube type: Oral Tube size: 7.0 mm Number of attempts: 1 Airway Equipment and Method: Stylet and Oral airway Placement Confirmation: ETT inserted through vocal cords under direct vision,  positive ETCO2 and breath sounds checked- equal and bilateral Secured at: 21 cm Tube secured with: Tape Dental Injury: Teeth and Oropharynx as per pre-operative assessment

## 2020-04-09 LAB — BASIC METABOLIC PANEL
Anion gap: 9 (ref 5–15)
BUN: 12 mg/dL (ref 8–23)
CO2: 27 mmol/L (ref 22–32)
Calcium: 8.5 mg/dL — ABNORMAL LOW (ref 8.9–10.3)
Chloride: 102 mmol/L (ref 98–111)
Creatinine, Ser: 0.82 mg/dL (ref 0.44–1.00)
GFR calc Af Amer: >60 mL/min (ref >60)
GFR calc non Af Amer: >60 mL/min (ref >60)
Glucose, Bld: 149 mg/dL — ABNORMAL HIGH (ref 70–99)
Potassium: 4.5 mmol/L (ref 3.5–5.1)
Sodium: 138 mmol/L (ref 135–145)

## 2020-04-09 LAB — CBC
HCT: 37.7 % (ref 36.0–46.0)
Hemoglobin: 11.8 g/dL — ABNORMAL LOW (ref 12.0–15.0)
MCH: 30.7 pg (ref 26.0–34.0)
MCHC: 31.3 g/dL (ref 30.0–36.0)
MCV: 98.2 fL (ref 80.0–100.0)
Platelets: 185 10*3/uL (ref 150–400)
RBC: 3.84 MIL/uL — ABNORMAL LOW (ref 3.87–5.11)
RDW: 13.6 % (ref 11.5–15.5)
WBC: 14.9 10*3/uL — ABNORMAL HIGH (ref 4.0–10.5)
nRBC: 0 % (ref 0.0–0.2)

## 2020-04-09 MED ORDER — OXYCODONE HCL 10 MG PO TABS: 10.0000 mg | ORAL_TABLET | ORAL | 0 refills | Status: DC | PRN

## 2020-04-09 MED ORDER — DOCUSATE SODIUM 100 MG PO CAPS: 100.0000 mg | ORAL_CAPSULE | Freq: Two times a day (BID) | ORAL | 0 refills | Status: DC

## 2020-04-09 MED ORDER — CYCLOBENZAPRINE HCL 10 MG PO TABS: 10.0000 mg | ORAL_TABLET | Freq: Three times a day (TID) | ORAL | 0 refills | Status: DC | PRN

## 2020-04-09 MED FILL — Sodium Chloride IV Soln 0.9%: INTRAVENOUS | Qty: 1000 | Status: AC

## 2020-04-09 MED FILL — Heparin Sodium (Porcine) Inj 1000 Unit/ML: INTRAMUSCULAR | Qty: 30 | Status: AC

## 2020-04-09 NOTE — Discharge Instructions (Signed)

## 2020-04-09 NOTE — Discharge Summary (Signed)
Physician Discharge Summary     Providing Compassionate, Quality Care - Together   Patient ID: Michelle Moore MRN: [id number] DOB/AGE: 10/07/1955 64 y.o.  Admit date: 04/08/2020 Discharge date: 04/09/2020  Admission Diagnoses: Recurrent displacement of lumbar disc  Discharge Diagnoses:  Active Problems:   Recurrent displacement of lumbar disc   Discharged Condition: good  Hospital Course: Patient underwent an L3-4, L4-5 PLIF by Dr. Arnoldo Morale on 04/08/2020. She was admitted to 3C02 overnight for observation after recovering from anesthesia in the PACU. Her postoperative course has been uncomplicated. She has worked with both physical and occupational therapies who feel the patient is ready for discharge home. She is ambulating independently and without difficulty. She is tolerating a normal diet. She is not having any bowel or bladder dysfunction. Her pain is well-controlled with oral pain medication. She is ready for discharge home.   Consults: rehabilitation medicine  Significant Diagnostic Studies: radiology: DG Lumbar Spine 2-3 Views  Result Date: 04/08/2020 CLINICAL DATA:  Provided history: L3-5 PLIF, reported fluoroscopy time 25 seconds, 17.94 mGy EXAM: LUMBAR SPINE - 2-3 VIEW COMPARISON:  Lumbar spine radiographs performed earlier the same day 04/08/2020 FINDINGS: PA and lateral view intraoperative fluoroscopic images of the lumbar spine are submitted, 3 images total. The images demonstrate bilateral pedicle screws at the L3, L4 and L5 levels as well as L3-L4 and L4-L5 interbody spacers. Retractors project posterior to the surgical levels. IMPRESSION: Three intraoperative fluoroscopic images of the lumbar spine as described. Electronically Signed   By: Kellie Simmering DO   On: 04/08/2020 12:41   DG Lumbar Spine 1 View  Addendum Date: 04/08/2020   ADDENDUM REPORT: 04/08/2020 12:40 ADDENDUM: This report should be for the lumbar spine single view, accession [id number]. The C-arm accession,  [id number], should be linked to the lumbar spine 2-3 view, accession, [id number]. Electronically Signed   By: Lajean Manes M.D.   On: 04/08/2020 12:40   Result Date: 04/08/2020 CLINICAL DATA:  Portable operative localization imaging for lumbar spine surgery.n EXAM: LUMBAR SPINE - 1 VIEW; DG C-ARM 1-60 MIN COMPARISON:  Lumbar MRI, 01/03/2020. FINDINGS: Single portable lateral view of the lumbar spine shows a surgical probe with its tip projecting 2.4 cm posterior the posterior upper corner of the L5 vertebra. IMPRESSION: Surgical localization imaging as described. Electronically Signed: By: Lajean Manes M.D. On: 04/08/2020 09:49   DG C-Arm 1-60 Min  Addendum Date: 04/08/2020   ADDENDUM REPORT: 04/08/2020 12:40 ADDENDUM: This report should be for the lumbar spine single view, accession [id number]. The C-arm accession, [id number], should be linked to the lumbar spine 2-3 view, accession, [id number]. Electronically Signed   By: Lajean Manes M.D.   On: 04/08/2020 12:40   Result Date: 04/08/2020 CLINICAL DATA:  Portable operative localization imaging for lumbar spine surgery.n EXAM: LUMBAR SPINE - 1 VIEW; DG C-ARM 1-60 MIN COMPARISON:  Lumbar MRI, 01/03/2020. FINDINGS: Single portable lateral view of the lumbar spine shows a surgical probe with its tip projecting 2.4 cm posterior the posterior upper corner of the L5 vertebra. IMPRESSION: Surgical localization imaging as described. Electronically Signed: By: Lajean Manes M.D. On: 04/08/2020 09:49     Treatments: surgery: Bilateral L3-4 and L4-5 laminotomy/foraminotomies/medial facetectomy to decompress the bilateral L3, L4 and L5 nerve roots(the work required to do this was in addition to the work required to do the posterior lumbar interbody fusion because of the patient's recurrent herniated disc, spinal stenosis, facet arthropathy. Etc. requiring a wide decompression of the nerve roots.);  L3-4 and  L4-5 transforaminal lumbar interbody fusion with local  morselized autograft bone and Zimmer DBM; insertion of interbody prosthesis at L3-4 and L4-5 (globus peek expandable interbody prosthesis); posterior segmental instrumentation from L3 to L5 with globus titanium pedicle screws and rods; posterior lateral arthrodesis at L3-4 and L4-5 with local morselized autograft bone and Zimmer DBM.  Discharge Exam: Blood pressure (!) 104/55, pulse 94, temperature 98.9 F (37.2 C), temperature source Oral, resp. rate 16, height 5\' 4"  (1.626 m), weight 62.6 kg, SpO2 100 %.   Per report: Alert and oriented x 4 PERRLA CN II-XII grossly intact MAE, Strength and sensation intact Incision is covered with Honeycomb dressing and Steri Strips; Dressing is clean, dry, and intact   Disposition: Discharge disposition: 01-Home or Self Care        Allergies as of 04/09/2020      Reactions   Other    BANDAIDS-OF LEFT ON FOR AN EXTENDED PERIOD OF TIME      Medication List    STOP taking these medications   citalopram 40 MG tablet Commonly known as: CELEXA   EXCEDRIN EXTRA STRENGTH PO   traMADol 50 MG tablet Commonly known as: ULTRAM     TAKE these medications   ALPRAZolam 0.25 MG tablet Commonly known as: XANAX Take 1 tablet (0.25 mg total) by mouth 2 (two) times daily as needed for anxiety.   anastrozole 1 MG tablet Commonly known as: ARIMIDEX Take 1 tablet (1 mg total) by mouth daily.   cetirizine 10 MG tablet Commonly known as: ZYRTEC Take 10 mg by mouth daily as needed for allergies.   cyclobenzaprine 10 MG tablet Commonly known as: FLEXERIL Take 1 tablet (10 mg total) by mouth 3 (three) times daily as needed for muscle spasms.   docusate sodium 100 MG capsule Commonly known as: COLACE Take 1 capsule (100 mg total) by mouth 2 (two) times daily.   escitalopram 20 MG tablet Commonly known as: Lexapro Take 1 tablet (20 mg total) by mouth daily.   fluticasone 50 MCG/ACT nasal spray Commonly known as: FLONASE Place 2 sprays into both  nostrils daily as needed for allergies.   Fluticasone-Salmeterol 250-50 MCG/DOSE Aepb Commonly known as: ADVAIR Inhale 1 puff into the lungs 2 (two) times daily.   folic acid 712 MCG tablet Commonly known as: FOLVITE Take 1,600 mcg by mouth daily.   methotrexate 2.5 MG tablet Commonly known as: RHEUMATREX Take 20 mg by mouth every Wednesday.   montelukast 10 MG tablet Commonly known as: SINGULAIR Take 1 tablet (10 mg total) by mouth at bedtime.   Olopatadine HCl 0.2 % Soln Place 1 drop into both eyes daily as needed (allergies).   Omeprazole 20 MG Tbec Take 20 mg by mouth every morning.   Oxycodone HCl 10 MG Tabs Take 1 tablet (10 mg total) by mouth every 4 (four) hours as needed for severe pain ((score 7 to 10)).   potassium chloride 10 MEQ tablet Commonly known as: KLOR-CON Take 1 tablet (10 mEq total) by mouth daily.   simvastatin 40 MG tablet Commonly known as: ZOCOR Take 1 tablet (40 mg total) by mouth at bedtime.       Follow-up Information    Newman Pies, MD. Schedule an appointment as soon as possible for a visit in 2 week(s).   Specialty: Neurosurgery Contact information: [address] [place] [address] [phone]               Signed: Patricia Nettle 04/09/2020,  9:58 AM

## 2020-04-09 NOTE — Evaluation (Signed)
Physical Therapy Evaluation Patient Details Name: Michelle Moore MRN: [id number] DOB: 01-Jun-1956 Today's Date: 04/09/2020   History of Present Illness  Pt is a 64 y/o female s/p PLIF L3-L5. PMH including but not limited to COPD and CAD.  Clinical Impression  Pt presented sitting upright in recliner chair, awake and willing to participate in therapy session. Prior to admission, pt reported that she was very independent with all functional mobility and ADLs. Pt lives with her husband in a single level home with several steps to enter. At the time of evaluation, pt overall at a supervision level with all functional mobility including hallway ambulation without the need for an AD. Pt also participated in stair training this session without difficulties. PT provided pt education re: back precautions, car transfers and a generalized walking program for pt to initiate upon d/c home. Pt expressed understanding. No further acute PT needs identified at this time. PT signing off.     Follow Up Recommendations No PT follow up    Equipment Recommendations  None recommended by PT    Recommendations for Other Services       Precautions / Restrictions Precautions Precautions: Back Precaution Comments: reviewed 3/3 back precautions with pt throughout Required Braces or Orthoses: Spinal Brace Spinal Brace: Lumbar corset;Applied in sitting position Restrictions Weight Bearing Restrictions: No      Mobility  Bed Mobility               General bed mobility comments: pt OOB in recliner chair upon arrival  Transfers Overall transfer level: Needs assistance Equipment used: None Transfers: Sit to/from Stand Sit to Stand: Supervision         General transfer comment: slow to rise, supervision for safety  Ambulation/Gait Ambulation/Gait assistance: Supervision Gait Distance (Feet): 500 Feet Assistive device: None Gait Pattern/deviations: Step-through pattern;Decreased stride length;Drifts  right/left Gait velocity: reduced   General Gait Details: pt with mild instability but no overt LOB or need for physical assistance, supervision for safety with hallway ambulation  Stairs Stairs: Yes Stairs assistance: Supervision Stair Management: One rail Left;Alternating pattern;Forwards Number of Stairs: 4 General stair comments: pt steady overall, no instability or LOB  Wheelchair Mobility    Modified Rankin (Stroke Patients Only)       Balance Overall balance assessment: Needs assistance Sitting-balance support: Feet supported Sitting balance-Leahy Scale: Good     Standing balance support: During functional activity;No upper extremity supported Standing balance-Leahy Scale: Good                               Pertinent Vitals/Pain Pain Assessment: Faces Faces Pain Scale: Hurts a little bit Pain Location: back Pain Descriptors / Indicators: Guarding Pain Intervention(s): Monitored during session;Repositioned    Home Living Family/patient expects to be discharged to:: Private residence Living Arrangements: Spouse/significant other Available Help at Discharge: Family;Available 24 hours/day Type of Home: House Home Access: Stairs to enter Entrance Stairs-Rails: Psychiatric nurse of Steps: 4-5 Home Layout: One level Home Equipment: None      Prior Function Level of Independence: Independent               Hand Dominance        Extremity/Trunk Assessment   Upper Extremity Assessment Upper Extremity Assessment: Defer to OT evaluation;Overall [facility] for tasks assessed    Lower Extremity Assessment Lower Extremity Assessment: Overall WFL for tasks assessed    Cervical / Trunk Assessment Cervical / Trunk Assessment:  Other exceptions Cervical / Trunk Exceptions: s/p lumbar sx  Communication   Communication: No difficulties  Cognition Arousal/Alertness: Awake/alert Behavior During Therapy: WFL for tasks  assessed/performed Overall Cognitive Status: Within Functional Limits for tasks assessed                                        General Comments      Exercises     Assessment/Plan    PT Assessment Patent does not need any further PT services  PT Problem List         PT Treatment Interventions      PT Goals (Current goals can be found in the Care Plan section)  Acute Rehab PT Goals Patient Stated Goal: "home today" PT Goal Formulation: All assessment and education complete, DC therapy    Frequency     Barriers to discharge        Co-evaluation               AM-PAC PT "6 Clicks" Mobility  Outcome Measure Help needed turning from your back to your side while in a flat bed without using bedrails?: None Help needed moving from lying on your back to sitting on the side of a flat bed without using bedrails?: None Help needed moving to and from a bed to a chair (including a wheelchair)?: None Help needed standing up from a chair using your arms (e.g., wheelchair or bedside chair)?: None Help needed to walk in hospital room?: None Help needed climbing 3-5 steps with a railing? : None 6 Click Score: 24    End of Session Equipment Utilized During Treatment: Back brace Activity Tolerance: Patient tolerated treatment well Patient left: in chair;with call bell/phone within reach Nurse Communication: Mobility status PT Visit Diagnosis: Other abnormalities of gait and mobility (R26.89)    Time: 1155-2080 PT Time Calculation (min) (ACUTE ONLY): 13 min   Charges:   PT Evaluation $PT Eval Low Complexity: 1 Low          Eduard Clos, PT, DPT  Acute Rehabilitation Services Pager [phone] Office [place] 04/09/2020, 9:24 AM

## 2020-04-09 NOTE — Evaluation (Signed)
Occupational Therapy Evaluation Patient Details Name: Michelle Moore MRN: [id number] DOB: 08/22/1956 Today's Date: 04/09/2020    History of Present Illness Pt is a 64 y/o female s/p PLIF L3-L5. PMH including but not limited to COPD and CAD.   Clinical Impression   PTA patient was living in a private residence with her spouse and was independent with BADLs/IADLs including homemaking and meal prep. 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 Per patient report, her husband is able to provide 24 hour supervision/assist as needed post d/c. Patient currently presents near baseline level of function demonstrating Mod I to supervision level for all functional mobility without use of AE, functional transfers, and BADLs including dressing, grooming standing at sink level, and toileting/hygiene/clothing management. Patient does not currently require continued acute OT services with no recommendation for OT follow-up post d/c.     Follow Up Recommendations  No OT follow up    Equipment Recommendations  None recommended by OT    Recommendations for Other Services       Precautions / Restrictions Precautions Precautions: Back Precaution Comments: reviewed 3/3 back precautions with pt throughout Required Braces or Orthoses: Spinal Brace Spinal Brace: Lumbar corset;Applied in sitting position Restrictions Weight Bearing Restrictions: No      Mobility Bed Mobility               General bed mobility comments: Patient seated in recliner upon entry.   Transfers Overall transfer level: Needs assistance Equipment used: None Transfers: Sit to/from Stand Sit to Stand: Supervision;Modified independent (Device/Increase time)         General transfer comment: Sit to stand x3 from various surfaces with supervision A progressing to Mod I with adherence to back precautions.     Balance Overall balance assessment: Needs  assistance Sitting-balance support: Feet supported Sitting balance-Leahy Scale: Good     Standing balance support: During functional activity;No upper extremity supported Standing balance-Leahy Scale: Good Standing balance comment: Patient completed 2/3 grooming tasks standing at sink level without UE support                           ADL either performed or assessed with clinical judgement   ADL Overall ADL's : Needs assistance/impaired     Grooming: Independent           Upper Body Dressing : Independent   Lower Body Dressing: Modified independent Lower Body Dressing Details (indicate cue type and reason): Able to thread BLE through underwear and pants in sitting with figure-4 position.  Toilet Transfer: Modified Programmer, applications Details (indicate cue type and reason): Hands on thighs for descent to maintain back precautions.  Toileting- Clothing Manipulation and Hygiene: Modified independent;Sitting/lateral lean;Sit to/from stand Toileting - Clothing Manipulation Details (indicate cue type and reason): Toileting/hygiene/clothing management with Mod I in sitting/standing     Functional mobility during ADLs: Independent General ADL Comments: Patient tolerated 15 minutes of activity without increased pain in low back.      Vision   Vision Assessment?: No apparent visual deficits     Perception     Praxis      Pertinent Vitals/Pain Pain Assessment: 0-10 Pain Score: 5  Faces Pain Scale: Hurts a little bit Pain Location: back Pain Descriptors / Indicators: Guarding Pain Intervention(s): Monitored during session     Hand Dominance     Extremity/Trunk Assessment Upper Extremity Assessment  Upper Extremity Assessment: Overall WFL for tasks assessed   Lower Extremity Assessment Lower Extremity Assessment: Overall WFL for tasks assessed   Cervical / Trunk Assessment Cervical / Trunk Assessment: Other exceptions Cervical / Trunk Exceptions:  s/p lumbar sx   Communication Communication Communication: No difficulties   Cognition Arousal/Alertness: Awake/alert Behavior During Therapy: WFL for tasks assessed/performed Overall Cognitive Status: Within Functional Limits for tasks assessed                                     General Comments       Exercises     Shoulder Instructions      Home Living Family/patient expects to be discharged to:: Private residence Living Arrangements: Spouse/significant other Available Help at Discharge: Family;Available 24 hours/day Type of Home: House Home Access: Stairs to enter [employer] of Steps: 4-5 Entrance Stairs-Rails: Right;Left Home Layout: One level     Bathroom Shower/Tub: Tub/shower unit         Home Equipment: None          Prior Functioning/Environment Level of Independence: Independent                 OT Problem List:        OT Treatment/Interventions:      OT Goals(Current goals can be found in the care plan section) Acute Rehab OT Goals Patient Stated Goal: To return home  OT Frequency:     Barriers to D/C:            Co-evaluation              AM-PAC OT "6 Clicks" Daily Activity     Outcome Measure Help from another person eating meals?: None Help from another person taking care of personal grooming?: None Help from another person toileting, which includes using toliet, bedpan, or urinal?: None Help from another person bathing (including washing, rinsing, drying)?: A Little Help from another person to put on and taking off regular upper body clothing?: None Help from another person to put on and taking off regular lower body clothing?: None 6 Click Score: 23   End of Session    Activity Tolerance: Patient tolerated treatment well Patient left: in chair;with call bell/phone within reach                   Time: 0828-0849 OT Time Calculation (min): 21 min Charges:  OT General Charges $OT Visit: 1  Visit OT Evaluation $OT Eval Low Complexity: 1 Low  Loel Betancur H. OTR/L Supplemental OT, Department of rehab services [phone]  Ian Cavey R H. 04/09/2020, 11:24 AM

## 2020-04-09 NOTE — Anesthesia Postprocedure Evaluation (Signed)
Anesthesia Post Note  Patient: Michelle Moore  Procedure(s) Performed: POSTERIOR LUMBAR INTERBODY FUSION, LAMINECTOMY, POSTERIOR INSTRUMENTATION LUMBAR THREE- LUMBAR FOUR, LUMBAR FOUR- LUMBAR FIVE (N/A )     Patient location during evaluation: PACU Anesthesia Type: General Level of consciousness: awake and alert Pain management: pain level controlled Vital Signs Assessment: post-procedure vital signs reviewed and stable Respiratory status: spontaneous breathing, nonlabored ventilation, respiratory function stable and patient connected to nasal cannula oxygen Cardiovascular status: blood pressure returned to baseline and stable Postop Assessment: no apparent nausea or vomiting Anesthetic complications: no   No complications documented.  Last Vitals:  Vitals:   04/09/20 0441 04/09/20 0756  BP: (!) 114/60 (!) 104/55  Pulse: 82 94  Resp: 20 16  Temp: 36.9 C 37.2 C  SpO2: 92% 100%    Last Pain:  Vitals:   04/09/20 0756  TempSrc: Oral  PainSc:    Pain Goal: Patients Stated Pain Goal: 3 (04/09/20 0400)                 Shaunie Boehm

## 2020-04-09 NOTE — Progress Notes (Signed)
Patient is discharged from room 3C02 at this time. Alert and in stable condition. IV site d/c'd and instructions read to patient and spouse with understanding verbalized and all questions answered. Left unit via wheelchair with all belongings at side.  ?

## 2020-04-13 ENCOUNTER — Other Ambulatory Visit: Payer: Self-pay | Admitting: *Deleted

## 2020-04-13 NOTE — Patient Outreach (Signed)
[facility]) Care Management  04/13/2020  Michelle Moore 08-25-1956 [id number]   Red on EMMI Alert  Day#1 Date: 04/11/20 Reason :  Who reached Patient   Know who to call about changes in condition? No   Scheduled follow-up? No   Other questions/problems? Yes    [facility] Admission 7/28-7/29/21 for Posterior Lumbar interbody fusion, Lumbar 3-4, 5.    Outreach Attempt #1 Subjective: Successful outreach call to patient , explained [facility] care management and  reason for the call. Addressed EMMI call and reasons, she states that she understands to who call for changes in condition. She explained that she called Dr. Arnoldo Morale office on Saturday due to pain and changes have been made to her medications. She reports that she has called office to schedule follow up appointment and awaiting on return call, she plans to also discuss pain medication plan again. She discussed that she is wearing brace as instructed and continuing with practices on movement and exercise as recommended by hospital therapist. She report using a rolling walker at home to get around. She discussed having support from her husband as needed in the home with daily care. She report that her spouse has observed her incision site and it looks good.   Explained to patient to anticipate one additional interactive voice response call after discharge.   Patient denied any other concern at this time.   Plan Will send [facility] successful outreach letter  Will close case to [facility] care management.    Joylene Draft, RN, BSN  [place] Management Coordinator  [phone]- Mobile [phone]- Toll Free Main Office

## 2020-04-15 ENCOUNTER — Other Ambulatory Visit: Payer: Self-pay | Admitting: *Deleted

## 2020-04-15 NOTE — Patient Outreach (Signed)
[facility]) Care Management  04/15/2020  Michelle Moore May 05, 1956 [id number]    Telephone outreach  Red on [place]   Day#4 Date: 8/3 Reason :   Who reached Patient  Know who to call about changes in condition?    Scheduled follow-up? No  Other questions/problems? No    Outreach attempt #1 Successful outreach attempt to patient , explained reason for the call . Addressed EMMI interactive voice response call and reason . Patient familiar with care coordinator from previous call on this week. Addressed reason for call, patient states that she scheduled appointment for post hospital visit with surgeon on 8/20.  Patient reports improved pain control after speaking with MD office and adjustments made in medications. She reports continuing to tolerating mobility in home using a cane.  She reports that her incision area is doing okay no signs of redness/drainage,  her spouse is able look at site.   Patient denies any other concerns at time, reinforced notifying surgeon of new concerns.    Plan Will close case to [facility] care management at this time no other care needs identified.    Joylene Draft, RN, BSN  [place] Management Coordinator  [phone]- Mobile [phone]- Toll Free Main Office

## 2020-04-30 ENCOUNTER — Other Ambulatory Visit: Payer: Self-pay

## 2020-04-30 MED ORDER — FLUTICASONE-SALMETEROL 250-50 MCG/DOSE IN AEPB: 1.0000 | INHALATION_SPRAY | Freq: Two times a day (BID) | RESPIRATORY_TRACT | 3 refills | Status: DC

## 2020-05-01 DIAGNOSIS — M4180 Other forms of scoliosis, site unspecified: Secondary | ICD-10-CM | POA: Diagnosis not present

## 2020-05-01 DIAGNOSIS — M5126 Other intervertebral disc displacement, lumbar region: Secondary | ICD-10-CM | POA: Diagnosis not present

## 2020-05-07 DIAGNOSIS — Z79899 Other long term (current) drug therapy: Secondary | ICD-10-CM | POA: Diagnosis not present

## 2020-05-07 DIAGNOSIS — M0579 Rheumatoid arthritis with rheumatoid factor of multiple sites without organ or systems involvement: Secondary | ICD-10-CM | POA: Diagnosis not present

## 2020-05-12 ENCOUNTER — Other Ambulatory Visit: Payer: Self-pay

## 2020-05-12 DIAGNOSIS — F411 Generalized anxiety disorder: Secondary | ICD-10-CM

## 2020-05-12 MED ORDER — ESCITALOPRAM OXALATE 20 MG PO TABS: 20.0000 mg | ORAL_TABLET | Freq: Every day | ORAL | 3 refills | Status: DC

## 2020-05-14 DIAGNOSIS — M0579 Rheumatoid arthritis with rheumatoid factor of multiple sites without organ or systems involvement: Secondary | ICD-10-CM | POA: Diagnosis not present

## 2020-05-14 DIAGNOSIS — Z79899 Other long term (current) drug therapy: Secondary | ICD-10-CM | POA: Diagnosis not present

## 2020-05-14 DIAGNOSIS — C50911 Malignant neoplasm of unspecified site of right female breast: Secondary | ICD-10-CM | POA: Diagnosis not present

## 2020-05-14 DIAGNOSIS — M65342 Trigger finger, left ring finger: Secondary | ICD-10-CM | POA: Diagnosis not present

## 2020-05-14 DIAGNOSIS — Z170 Estrogen receptor positive status [ER+]: Secondary | ICD-10-CM | POA: Diagnosis not present

## 2020-05-14 DIAGNOSIS — M8949 Other hypertrophic osteoarthropathy, multiple sites: Secondary | ICD-10-CM | POA: Diagnosis not present

## 2020-05-21 ENCOUNTER — Ambulatory Visit: Payer: Medicare HMO | Admitting: Nurse Practitioner

## 2020-06-02 ENCOUNTER — Other Ambulatory Visit: Payer: Self-pay

## 2020-06-02 DIAGNOSIS — E782 Mixed hyperlipidemia: Secondary | ICD-10-CM

## 2020-06-02 MED ORDER — SIMVASTATIN 40 MG PO TABS: 40.0000 mg | ORAL_TABLET | Freq: Every day | ORAL | 3 refills | Status: DC

## 2020-06-25 ENCOUNTER — Encounter: Payer: Self-pay | Admitting: Radiation Oncology

## 2020-06-25 ENCOUNTER — Ambulatory Visit
Admission: RE | Admit: 2020-06-25 | Discharge: 2020-06-25 | Disposition: A | Discharge status: Home / Self Care | Payer: Medicare HMO | Source: Ambulatory Visit | Attending: Radiation Oncology | Admitting: Radiation Oncology

## 2020-06-25 ENCOUNTER — Other Ambulatory Visit: Payer: Self-pay

## 2020-06-25 VITALS — Temp 97.9°F | Wt 141.0 lb

## 2020-06-25 DIAGNOSIS — Z170 Estrogen receptor positive status [ER+]: Secondary | ICD-10-CM | POA: Insufficient documentation

## 2020-06-25 DIAGNOSIS — Z923 Personal history of irradiation: Secondary | ICD-10-CM | POA: Diagnosis not present

## 2020-06-25 DIAGNOSIS — Z79811 Long term (current) use of aromatase inhibitors: Secondary | ICD-10-CM | POA: Diagnosis not present

## 2020-06-25 DIAGNOSIS — D0511 Intraductal carcinoma in situ of right breast: Secondary | ICD-10-CM | POA: Insufficient documentation

## 2020-06-25 NOTE — Addendum Note (Signed)
Encounter addended by: Noreene Filbert, MD on: 06/25/2020 12:07 PM  Actions taken: Clinical Note Signed, Level of Service modified

## 2020-06-25 NOTE — Progress Notes (Signed)
Radiation Oncology Follow up Note  Name: Michelle Moore   Date:   06/25/2020 MRN:  [id number] DOB: [birth year]    This 64 y.o. 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  Seen today in routine follow-up she is doing well specifically denies breast tenderness cough or bone pain..  She had a mammogram back in May which I have reviewed was BI-RADS 2 benign.  She is currently on Arimidex tolerating it well without side effect.  COMPLICATIONS OF TREATMENT: none  FOLLOW UP COMPLIANCE: keeps appointments   PHYSICAL EXAM:  Temp 97.9 F (36.6 C) (Tympanic)   Wt 141 lb (64 kg)   BMI 24.20 kg/m  Lungs are clear to A&P cardiac examination essentially unremarkable with regular rate and rhythm. No dominant mass or nodularity is noted in either breast in 2 positions examined. Incision is well-healed. No axillary or supraclavicular adenopathy is appreciated. Cosmetic result is excellent.  Some slight shrinkage of the right breast although cosmetic result is still excellent.  Well-developed well-nourished patient in NAD. HEENT reveals PERLA, EOMI, discs not visualized.  Oral cavity is clear. No oral mucosal lesions are identified. Neck is clear without evidence of cervical or supraclavicular adenopathy. Lungs are clear to A&P. Cardiac examination is essentially unremarkable with regular rate and rhythm without murmur rub or thrill. Abdomen is benign with no organomegaly or masses noted. Motor sensory and DTR levels are equal and symmetric in the upper and lower extremities. Cranial nerves II through XII are grossly intact. Proprioception is intact. No peripheral adenopathy or edema is identified. No motor or  sensory levels are noted. Crude visual fields are within normal range.  RADIOLOGY RESULTS: Mammograms reviewed and compatible with above-stated findings  PLAN: Present time she is now 2 years out doing well with no evidence of disease and pleased with her overall progress.  Of asked to see her back in 1 year for follow-up.  She continues on Arimidex without side effect.  Patient knows to call with any concerns at any time.  I would like to take this opportunity to thank you for allowing me to participate in the care of your patient.Noreene Filbert, MD

## 2020-07-02 ENCOUNTER — Ambulatory Visit (INDEPENDENT_AMBULATORY_CARE_PROVIDER_SITE_OTHER): Payer: Medicare HMO | Admitting: Nurse Practitioner

## 2020-07-02 ENCOUNTER — Other Ambulatory Visit: Payer: Self-pay

## 2020-07-02 VITALS — BP 152/70 | HR 85 | Temp 97.5°F | Resp 16 | Ht 61.0 in | Wt 140.8 lb

## 2020-07-02 DIAGNOSIS — R300 Dysuria: Secondary | ICD-10-CM | POA: Diagnosis not present

## 2020-07-02 DIAGNOSIS — E782 Mixed hyperlipidemia: Secondary | ICD-10-CM

## 2020-07-02 DIAGNOSIS — J3089 Other allergic rhinitis: Secondary | ICD-10-CM

## 2020-07-02 DIAGNOSIS — N390 Urinary tract infection, site not specified: Secondary | ICD-10-CM | POA: Diagnosis not present

## 2020-07-02 DIAGNOSIS — I10 Essential (primary) hypertension: Secondary | ICD-10-CM | POA: Diagnosis not present

## 2020-07-02 DIAGNOSIS — E876 Hypokalemia: Secondary | ICD-10-CM

## 2020-07-02 DIAGNOSIS — C50911 Malignant neoplasm of unspecified site of right female breast: Secondary | ICD-10-CM | POA: Diagnosis not present

## 2020-07-02 DIAGNOSIS — Z0001 Encounter for general adult medical examination with abnormal findings: Secondary | ICD-10-CM | POA: Diagnosis not present

## 2020-07-02 MED ORDER — POTASSIUM CHLORIDE CRYS ER 10 MEQ PO TBCR: 10.0000 meq | EXTENDED_RELEASE_TABLET | Freq: Every day | ORAL | 1 refills | Status: DC

## 2020-07-02 MED ORDER — MONTELUKAST SODIUM 10 MG PO TABS: 10.0000 mg | ORAL_TABLET | Freq: Every day | ORAL | 1 refills | Status: DC

## 2020-07-02 NOTE — Progress Notes (Signed)
[facility] [place], [place] [address]  Internal MEDICINE  Office Visit Note  Patient Name: Michelle Moore  [id number]  [id number]  Date of Service: 07/25/2020   Pt is here for routine health maintenance examination  Chief Complaint  Patient presents with  . Medicare Wellness  . Gastroesophageal Reflux  . Quality Metric Gaps    colonoscopy,flu,tetnaus  . controlled substance form    reviewed with PT  . Anxiety     The patient is here for health maintenance exam. She she had spinal fusion surgery the last week of July, 2021. She has not had to have any tramadol since then. She is walking nearly two miles several days per week. Is feeling well.  She routine fasting labs done in 03/2019. She had mild elevation of triglycerides, but remainder of lipid panel was normal. She sees oncology/surgery due to recent history of breast cancer. Currently on arimidex daily.     Current Medication: Outpatient Encounter Medications as of 07/02/2020  Medication Sig  . ALPRAZolam (XANAX) 0.25 MG tablet Take 1 tablet (0.25 mg total) by mouth 2 (two) times daily as needed for anxiety. (Patient taking differently: Take 0.25 mg by mouth 2 (two) times daily as needed for anxiety. )  . anastrozole (ARIMIDEX) 1 MG tablet Take 1 tablet (1 mg total) by mouth daily.  . cetirizine (ZYRTEC) 10 MG tablet Take 10 mg by mouth daily as needed for allergies.   . cyclobenzaprine (FLEXERIL) 10 MG tablet Take 1 tablet (10 mg total) by mouth 3 (three) times daily as needed for muscle spasms.  Marland Kitchen docusate sodium (COLACE) 100 MG capsule Take 1 capsule (100 mg total) by mouth 2 (two) times daily.  Marland Kitchen escitalopram (LEXAPRO) 20 MG tablet Take 1 tablet (20 mg total) by mouth daily.  . fluticasone (FLONASE) 50 MCG/ACT nasal spray Place 2 sprays into both nostrils daily as needed for allergies.  . Fluticasone-Salmeterol (ADVAIR) 250-50 MCG/DOSE AEPB Inhale 1 puff into the lungs 2 (two) times daily.  .  folic acid (FOLVITE) 706 MCG tablet Take 1,600 mcg by mouth daily.  . methotrexate (RHEUMATREX) 2.5 MG tablet Take 20 mg by mouth every Wednesday.   . montelukast (SINGULAIR) 10 MG tablet Take 1 tablet (10 mg total) by mouth at bedtime.  . Olopatadine HCl 0.2 % SOLN Place 1 drop into both eyes daily as needed (allergies).  . Omeprazole 20 MG TBEC Take 20 mg by mouth every morning.   Marland Kitchen oxyCODONE 10 MG TABS Take 1 tablet (10 mg total) by mouth every 4 (four) hours as needed for severe pain ((score 7 to 10)).  Marland Kitchen potassium chloride (KLOR-CON) 10 MEQ tablet Take 1 tablet (10 mEq total) by mouth daily.  . simvastatin (ZOCOR) 40 MG tablet Take 1 tablet (40 mg total) by mouth at bedtime.  . [DISCONTINUED] montelukast (SINGULAIR) 10 MG tablet Take 1 tablet (10 mg total) by mouth at bedtime.  . [DISCONTINUED] potassium chloride (KLOR-CON) 10 MEQ tablet Take 1 tablet (10 mEq total) by mouth daily.  Marland Kitchen amoxicillin-clavulanate (AUGMENTIN) 875-125 MG tablet Take 1 tablet by mouth 2 (two) times daily.   No facility-administered encounter medications on file as of 07/02/2020.    Surgical History: Past Surgical History:  Procedure Laterality Date  . ABDOMINAL HYSTERECTOMY    . ANTERIOR CERVICAL DECOMP/DISCECTOMY FUSION N/A 01/20/2014   Procedure: ANTERIOR CERVICAL DECOMPRESSION/DISCECTOMY FUSION 2 LEVELS cervical five/six six [place];  Surgeon: Ophelia Charter, MD;  Location: [facility];  Service: Neurosurgery;  Laterality: N/A;  . BACK SURGERY     lumbar  . 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 BREAST BIOPSY  01/16/2019   coil, BENIGN MAMMARY TISSUE WITH MODERATE STROMAL FIBROSIS AND COARSE DYSTROPHIC CALCIFICATIONS of the RIGHT breast  . BREAST LUMPECTOMY Right 03/07/2018   Procedure: BREAST LUMPECTOMY/ RE EXCISION;  Surgeon: Herbert Pun, MD;  Location: [facility] ORS;  Service: General;  Laterality: Right;  . CARDIAC CATHETERIZATION     2011  . CORONARY ANGIOPLASTY WITH STENT  PLACEMENT     PLACED IN RCA 2011  . EYE SURGERY     lasik  . PARTIAL MASTECTOMY WITH NEEDLE LOCALIZATION Right 02/21/2018   Procedure: PARTIAL MASTECTOMY WITH NEEDLE LOCALIZATION;  Surgeon: Herbert Pun, MD;  Location: [facility] ORS;  Service: General;  Laterality: Right;  . ROTATOR CUFF REPAIR     left shoulder  . SHOULDER ARTHROSCOPY WITH ROTATOR CUFF REPAIR AND OPEN BICEPS TENODESIS Right 08/07/2019   Procedure: SHOULDER ARTHROSCOPY WITH DEBRIDEMENT, DECOMPRESSION, ROTATOR CUFF REPAIR AND BICEPS TENODESIS. - RNFA;  Surgeon: Corky Mull, MD;  Location: [facility] ORS;  Service: Orthopedics;  Laterality: Right;  . TOE SURGERY Right    has pin and plate in it    Medical History: Past Medical History:  Diagnosis Date  . Anxiety   . Arthritis   . Cancer ([place])    breast-right   . COPD (chronic obstructive pulmonary disease) ([place])   . Coronary artery disease   . Dyspnea    ONLY WITH EXERTION  . GERD (gastroesophageal reflux disease)    "sometimes"  . Headache(784.0)    sinus headaches  . Myocardial infarction ([place])    in 2011  no damage-1 stent  . Personal history of radiation therapy   . Seizures ([place]) 2014   X1-no idea what caused her seizure  . Sleep apnea    USES CPAP    Family History: Family History  Problem Relation Age of Onset  . Breast cancer Cousin 14       bilateral at 66 and 42; daughter of maternal aunt who was unaffected  . Lung cancer Maternal Aunt        dx 75s; deceased 55s; smoker  . Heart attack Father        deceased 43  . Stomach cancer Maternal Grandmother       Review of Systems  Constitutional: Negative for chills, fatigue and unexpected weight change.  HENT: Negative for congestion, postnasal drip, rhinorrhea, sneezing and sore throat.   Respiratory: Negative for cough, chest tightness and shortness of breath.   Cardiovascular: Negative for chest pain and palpitations.  Gastrointestinal: Negative for abdominal pain, constipation, diarrhea,  nausea and vomiting.  Endocrine: Negative for cold intolerance, heat intolerance, polydipsia and polyuria.  Musculoskeletal: Positive for arthralgias and back pain. Negative for joint swelling and neck pain.       Generalized joint pain. Does see rheumatology. Improved lower back pain since having spinal surgery in 03/2020.   Skin: Negative for rash.  Allergic/Immunologic: Positive for environmental allergies.  Neurological: Negative for dizziness, tremors, numbness and headaches.  Hematological: Negative for adenopathy. Does not bruise/bleed easily.  Psychiatric/Behavioral: Positive for dysphoric mood. Negative for behavioral problems (Depression), sleep disturbance and suicidal ideas. The patient is nervous/anxious.        Symptoms recently worse.     Today's Vitals   07/02/20 1421  BP: (!) 152/70  Pulse: 85  Resp: 16  Temp: (!) 97.5  F (36.4 C)  SpO2: 96%  Weight: 140 lb 12.8 oz (63.9 kg)  Height: 5\' 1"  (1.549 m)   Body mass index is 26.6 kg/m.  Physical Exam Vitals and nursing note reviewed.  Constitutional:      General: She is not in acute distress.    Appearance: Normal appearance. She is well-developed. She is not diaphoretic.  HENT:     Head: Normocephalic and atraumatic.     Nose: Nose normal.     Mouth/Throat:     Pharynx: No oropharyngeal exudate.  Eyes:     Pupils: Pupils are equal, round, and reactive to light.  Neck:     Thyroid: No thyromegaly.     Vascular: No carotid bruit or JVD.     Trachea: No tracheal deviation.  Cardiovascular:     Rate and Rhythm: Normal rate and regular rhythm.     Pulses: Normal pulses.     Heart sounds: Normal heart sounds. No murmur heard.  No friction rub. No gallop.   Pulmonary:     Effort: Pulmonary effort is normal. No respiratory distress.     Breath sounds: Normal breath sounds. No wheezing or rales.  Chest:     Chest wall: No tenderness.  Abdominal:     General: Bowel sounds are normal.     Palpations: Abdomen  is soft.     Tenderness: There is no abdominal tenderness.  Musculoskeletal:        General: Normal range of motion.     Cervical back: Normal range of motion and neck supple.  Lymphadenopathy:     Cervical: No cervical adenopathy.  Skin:    General: Skin is warm and dry.  Neurological:     General: No focal deficit present.     Mental Status: She is alert and oriented to person, place, and time.     Cranial Nerves: No cranial nerve deficit.  Psychiatric:        Attention and Perception: Attention and perception normal.        Mood and Affect: Mood is anxious and depressed. Affect is tearful.        Speech: Speech normal.        Behavior: Behavior normal. Behavior is cooperative.        Thought Content: Thought content normal.        Cognition and Memory: Cognition and memory normal.        Judgment: Judgment normal.    Depression screen [facility] 2/9 07/02/2020 01/07/2020 05/17/2019 12/27/2018 11/16/2018  Decreased Interest 0 0 0 0 0  Down, Depressed, Hopeless 0 0 0 0 0  PHQ - 2 Score 0 0 0 0 0    Functional Status Survey: Is the patient deaf or have difficulty hearing?: No Does the patient have difficulty seeing, even when wearing glasses/contacts?: No Does the patient have difficulty concentrating, remembering, or making decisions?: No Does the patient have difficulty walking or climbing stairs?: No Does the patient have difficulty dressing or bathing?: No Does the patient have difficulty doing errands alone such as visiting a doctor's office or shopping?: No  MMSE - [place] Exam 07/02/2020 05/17/2019 11/16/2018  Orientation to time 5 5 5   Orientation to Place 5 5 5   Registration 3 3 3   Attention/ Calculation 5 5 5   Recall 3 3 3   Language- name 2 objects 2 2 2   Language- repeat 1 1 1   Language- follow 3 step command 3 3 3   Language- read & follow  direction 1 1 1   Write a sentence 1 1 1   Copy design 1 1 1   Total score 30 30 30     Fall Risk  07/02/2020 01/07/2020  05/17/2019 12/27/2018 11/16/2018  Falls in the past year? 0 1 1 0 0  Number falls in past yr: - 1 0 0 -  Injury with Fall? - 0 0 0 -     LABS: Recent Results (from the past 2160 hour(s))  UA/M w/rflx Culture, Routine     Status: None   Collection Time: 07/02/20  2:19 PM   Specimen: Urine   Urine  Result Value Ref Range   Specific Gravity, UA 1.011 1.005 - 1.030   pH, UA 6.0 5.0 - 7.5   Color, UA Yellow Yellow   Appearance Ur Clear Clear   Leukocytes,UA Negative Negative   Protein,UA Negative Negative/Trace   Glucose, UA Negative Negative   Ketones, UA Negative Negative   RBC, UA Negative Negative   Bilirubin, UA Negative Negative   Urobilinogen, Ur 0.2 0.2 - 1.0 mg/dL   Nitrite, UA Negative Negative   Microscopic Examination Comment     Comment: Microscopic follows if indicated.   Microscopic Examination See below:     Comment: Microscopic was indicated and was performed.   Urinalysis Reflex Comment     Comment: This specimen has reflexed to a Urine Culture.  Microscopic Examination     Status: Abnormal   Collection Time: 07/02/20  2:19 PM   Urine  Result Value Ref Range   WBC, UA None seen 0 - 5 /hpf   RBC 0-2 0 - 2 /hpf   Epithelial Cells (non renal) None seen 0 - 10 /hpf   Casts None seen None seen /lpf   Bacteria, UA Many (A) None seen/Few  Urine Culture, Reflex     Status: Abnormal   Collection Time: 07/02/20  2:19 PM   Urine  Result Value Ref Range   Urine Culture, Routine Final report (A)    Organism ID, Bacteria Escherichia coli (A)     Comment: Multi-Drug Resistant Organism Greater than 100,000 colony forming units per mL Susceptibility profile is consistent with a probable ESBL.    ORGANISM ID, BACTERIA Comment     Comment: Mixed urogenital flora 25,000-50,000 colony forming units per mL    Antimicrobial Susceptibility Comment     Comment:       ** S = Susceptible; I = Intermediate; R = Resistant **                    P = Positive; N = Negative              MICS are expressed in micrograms per mL    Antibiotic                 RSLT#1    RSLT#2    RSLT#3    RSLT#4 Amoxicillin/Clavulanic Acid    S Ampicillin                     R Cefazolin                      R Cefepime                       S Ceftriaxone                    R Cefuroxime  R Ciprofloxacin                  R Ertapenem                      S Gentamicin                     S Imipenem                       S Levofloxacin                   R Meropenem                      S Nitrofurantoin                 S Piperacillin/Tazobactam        S Tetracycline                   S Tobramycin                     S Trimethoprim/Sulfa             S     Assessment/Plan: 1. Encounter for general adult medical examination with abnormal findings Annual health maintenance exam today.   2. Essential hypertension genrally stable. Continue bp medication as prescribed   3. Hypokalemia Continue Klor-Con 54mEq everyday - potassium chloride (KLOR-CON) 10 MEQ tablet; Take 1 tablet (10 mEq total) by mouth daily.  Dispense: 90 tablet; Refill: 1  4. Mixed hyperlipidemia Recent fasting lipid panel stable. Continue simvastatin as prescribed   5. Perennial allergic rhinitis Continue allergy medication as prescribed. Renew singulair today  - montelukast (SINGULAIR) 10 MG tablet; Take 1 tablet (10 mg total) by mouth at bedtime.  Dispense: 90 tablet; Refill: 1  6. Malignant neoplasm of right female breast, unspecified estrogen receptor status, unspecified site of breast ([place]) Continue regular visits with oncology/surgery for surveillance.   7. Urinary tract infection without hematuria, site unspecified Start augmentin 875mg  twice daily for 7 days.  - amoxicillin-clavulanate (AUGMENTIN) 875-125 MG tablet; Take 1 tablet by mouth 2 (two) times daily.  Dispense: 14 tablet; Refill: 0  8. Dysuria - UA/M w/rflx Culture, Routine  General Counseling: Ladaija verbalizes understanding of  the findings of todays visit and agrees with plan of treatment. I have discussed any further diagnostic evaluation that may be needed or ordered today. We also reviewed her medications today. she has been encouraged to call the office with any questions or concerns that should arise related to todays visit.    Counseling:  Hypertension Counseling:   The following hypertensive lifestyle modification were recommended and discussed:  1. Limiting alcohol intake to less than 1 oz/day of ethanol:(24 oz of beer or 8 oz of wine or 2 oz of 100-proof whiskey). 2. Take baby ASA 81 mg daily. 3. Importance of regular aerobic exercise and losing weight. 4. Reduce dietary saturated fat and cholesterol intake for overall cardiovascular health. 5. Maintaining adequate dietary potassium, calcium, and magnesium intake. 6. Regular monitoring of the blood pressure. 7. Reduce sodium intake to less than 100 mmol/day (less than 2.3 gm of sodium or less than 6 gm of sodium choride)   This patient was seen by [place] with Dr Lavera Guise as a part of collaborative care agreement  Orders Placed This Encounter  Procedures  .  Microscopic Examination  . Urine Culture, Reflex  . UA/M w/rflx Culture, Routine    Meds ordered this encounter  Medications  . montelukast (SINGULAIR) 10 MG tablet    Sig: Take 1 tablet (10 mg total) by mouth at bedtime.    Dispense:  90 tablet    Refill:  1    Order Specific Question:   Supervising Provider    Answer:   Lavera Guise [[id number]]  . potassium chloride (KLOR-CON) 10 MEQ tablet    Sig: Take 1 tablet (10 mEq total) by mouth daily.    Dispense:  90 tablet    Refill:  1    Order Specific Question:   Supervising Provider    Answer:   Lavera Guise [[id number]]  . amoxicillin-clavulanate (AUGMENTIN) 875-125 MG tablet    Sig: Take 1 tablet by mouth 2 (two) times daily.    Dispense:  14 tablet    Refill:  0    Order Specific Question:   Supervising Provider     Answer:   Lavera Guise [[id number]]    Total time spent: 47 Minutes  Time spent includes review of chart, medications, test results, and follow up plan with the patient.     Lavera Guise, MD  Internal Medicine

## 2020-07-10 LAB — MICROSCOPIC EXAMINATION
Casts: NONE SEEN /lpf
Epithelial Cells (non renal): NONE SEEN /hpf (ref 0–10)
WBC, UA: NONE SEEN /hpf (ref 0–5)

## 2020-07-10 LAB — UA/M W/RFLX CULTURE, ROUTINE
Bilirubin, UA: NEGATIVE
Glucose, UA: NEGATIVE
Ketones, UA: NEGATIVE
Leukocytes,UA: NEGATIVE
Nitrite, UA: NEGATIVE
Protein,UA: NEGATIVE
RBC, UA: NEGATIVE
Specific Gravity, UA: 1.011 (ref 1.005–1.030)
Urobilinogen, Ur: 0.2 mg/dL (ref 0.2–1.0)
pH, UA: 6 (ref 5.0–7.5)

## 2020-07-10 LAB — URINE CULTURE, REFLEX

## 2020-07-10 MED ORDER — AMOXICILLIN-POT CLAVULANATE 875-125 MG PO TABS: 1.0000 | ORAL_TABLET | Freq: Two times a day (BID) | ORAL | 0 refills | Status: DC

## 2020-07-10 NOTE — Progress Notes (Signed)
Please let the patient know that urine sample showed infection at her physical. I added augmentin 875mg  twice daily for next 7 days. Thanks.

## 2020-07-25 DIAGNOSIS — N390 Urinary tract infection, site not specified: Secondary | ICD-10-CM | POA: Insufficient documentation

## 2020-07-25 DIAGNOSIS — E876 Hypokalemia: Secondary | ICD-10-CM | POA: Insufficient documentation

## 2020-07-28 ENCOUNTER — Telehealth: Payer: Self-pay

## 2020-07-29 ENCOUNTER — Other Ambulatory Visit: Payer: Self-pay | Admitting: Nurse Practitioner

## 2020-07-29 DIAGNOSIS — F411 Generalized anxiety disorder: Secondary | ICD-10-CM

## 2020-07-29 MED ORDER — ALPRAZOLAM 0.25 MG PO TABS: 0.2500 mg | ORAL_TABLET | Freq: Two times a day (BID) | ORAL | 3 refills | Status: DC | PRN

## 2020-07-29 NOTE — Telephone Encounter (Signed)
PT NOTIFIED  

## 2020-07-29 NOTE — Telephone Encounter (Signed)
Done and sent new prescription to [facility].

## 2020-08-12 ENCOUNTER — Other Ambulatory Visit: Payer: Self-pay

## 2020-08-12 DIAGNOSIS — F411 Generalized anxiety disorder: Secondary | ICD-10-CM

## 2020-08-12 MED ORDER — ESCITALOPRAM OXALATE 20 MG PO TABS: 20.0000 mg | ORAL_TABLET | Freq: Every day | ORAL | 3 refills | Status: DC

## 2020-08-13 DIAGNOSIS — M0579 Rheumatoid arthritis with rheumatoid factor of multiple sites without organ or systems involvement: Secondary | ICD-10-CM | POA: Diagnosis not present

## 2020-08-13 DIAGNOSIS — Z79899 Other long term (current) drug therapy: Secondary | ICD-10-CM | POA: Diagnosis not present

## 2020-08-15 NOTE — Progress Notes (Signed)
[place]  [phone] [phone]   ID: Michelle Moore OB: September 08, 1956  MR#: [id number]  WUX#:[id number]  Patient Care Team: Lavera Guise, MD as PCP - General (Internal Medicine)  I connected with [name] on 08/20/20 at 11:00 AM EST by video enabled telemedicine visit and verified that I am speaking with the correct person using two identifiers.   I discussed the limitations, risks, security and privacy concerns of performing an evaluation and management service by telemedicine and the availability of in-person appointments. I also discussed with the patient that there may be a patient responsible charge related to this service. The patient expressed understanding and agreed to proceed.   Other persons participating in the visit and their role in the encounter: Patient, MD.  Patient's location: Home. Provider's location: Clinic.  CHIEF COMPLAINT: DCIS, right breast.  INTERVAL HISTORY: Patient agreed to video assisted telemedicine visit for routine 7-monthevaluation.  She does not complain of joint pain today.  She continues to tolerate anastrozole well without significant side effects.  She has no neurologic complaints.  She denies any recent fevers or illnesses.  She has a good appetite and denies weight loss.  She denies any chest pain, shortness of breath, cough, or hemoptysis.  She denies any nausea, vomiting, constipation, or diarrhea. She has no urinary complaints.  Patient offers no specific complaints today.  REVIEW OF SYSTEMS:   Review of Systems  Constitutional: Negative.  Negative for fever, malaise/fatigue and weight loss.  Respiratory: Negative.  Negative for cough, hemoptysis and shortness of breath.   Cardiovascular: Negative.  Negative for chest pain and leg swelling.  Gastrointestinal: Negative.  Negative for abdominal pain.  Genitourinary: Negative.  Negative for dysuria.  Musculoskeletal: Negative.  Negative for back pain  and myalgias.  Skin: Negative.  Negative for rash.  Neurological: Negative.  Negative for dizziness, sensory change, focal weakness and weakness.  Psychiatric/Behavioral: Negative.  The patient is not nervous/anxious.     As per HPI. Otherwise, a complete review of systems is negative.  PAST MEDICAL HISTORY: Past Medical History:  Diagnosis Date  . Anxiety   . Arthritis   . [place]    breast-right   . [place] disease) [place]   . Coronary artery disease   . Dyspnea    ONLY WITH EXERTION  . GERD (gastroesophageal reflux disease)    "sometimes"  . Headache(784.0)    sinus headaches  . [place]    in 2011  no damage-1 stent  . Personal history of radiation therapy   . Seizures ([facility] 2014   X1-no idea what caused her seizure  . Sleep apnea    USES CPAP    PAST SURGICAL HISTORY: Past Surgical History:  Procedure Laterality Date  . ABDOMINAL HYSTERECTOMY    . ANTERIOR CERVICAL DECOMP/DISCECTOMY FUSION N/A 01/20/2014   Procedure: ANTERIOR CERVICAL DECOMPRESSION/DISCECTOMY FUSION 2 LEVELS cervical five/six six /Tarry Kos  Surgeon: JOphelia Charter MD;  [facility];  Service: Neurosurgery;  Laterality: N/A;  . BACK SURGERY     lumbar  . 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 BREAST BIOPSY  01/16/2019   coil, BENIGN MAMMARY TISSUE WITH MODERATE STROMAL FIBROSIS AND COARSE DYSTROPHIC CALCIFICATIONS of the RIGHT breast  . BREAST LUMPECTOMY Right 03/07/2018   Procedure: BREAST LUMPECTOMY/ RE EXCISION;  Surgeon: CHerbert Pun MD;  Location: [facility];  Service: General;  Laterality: Right;  . CARDIAC CATHETERIZATION  2011  . CORONARY ANGIOPLASTY WITH STENT PLACEMENT     PLACED IN RCA 2011  . EYE SURGERY     lasik  . PARTIAL MASTECTOMY WITH NEEDLE LOCALIZATION Right 02/21/2018   Procedure: PARTIAL MASTECTOMY WITH NEEDLE LOCALIZATION;  Surgeon: Herbert Pun, MD;  Location: [facility];  Service:  General;  Laterality: Right;  . ROTATOR CUFF REPAIR     left shoulder  . SHOULDER ARTHROSCOPY WITH ROTATOR CUFF REPAIR AND OPEN BICEPS TENODESIS Right 08/07/2019   Procedure: SHOULDER ARTHROSCOPY WITH DEBRIDEMENT, DECOMPRESSION, ROTATOR CUFF REPAIR AND BICEPS TENODESIS. - RNFA;  Surgeon: Corky Mull, MD;  Location: [facility] ORS;  Service: Orthopedics;  Laterality: Right;  . TOE SURGERY Right    has pin and plate in it    FAMILY HISTORY: Family History  Problem Relation Age of Onset  . Breast cancer Cousin 40       bilateral at 43 and 31; daughter of maternal aunt who was unaffected  . Lung cancer Maternal Aunt        dx 1s; deceased 38s; smoker  . Heart attack Father        deceased 46  . Stomach cancer Maternal Grandmother     ADVANCED DIRECTIVES (Y/N):  N  HEALTH MAINTENANCE: Social History   Tobacco Use  . Smoking status: Former Smoker    Packs/day: 1.00    Years: 30.00    Pack years: 30.00    Types: Cigarettes    Quit date: 09/13/2015    Years since quitting: 4.9  . Smokeless tobacco: Never Used  Vaping Use  . Vaping Use: Never used  Substance Use Topics  . Alcohol use: Yes    Comment: occasional  . Drug use: No     Colonoscopy:  PAP:  Bone density:  Lipid panel:  Allergies  Allergen Reactions  . Other     BANDAIDS-OF LEFT ON FOR AN EXTENDED PERIOD OF TIME    Current Outpatient Medications  Medication Sig Dispense Refill  . ALPRAZolam (XANAX) 0.25 MG tablet Take 1 tablet (0.25 mg total) by mouth 2 (two) times daily as needed for anxiety. 45 tablet 3  . anastrozole (ARIMIDEX) 1 MG tablet Take 1 tablet (1 mg total) by mouth daily. 90 tablet 1  . cetirizine (ZYRTEC) 10 MG tablet Take 10 mg by mouth daily as needed for allergies.     . cyclobenzaprine (FLEXERIL) 10 MG tablet Take 1 tablet (10 mg total) by mouth 3 (three) times daily as needed for muscle spasms. 30 tablet 0  . escitalopram (LEXAPRO) 20 MG tablet Take 1 tablet (20 mg total) by mouth daily. 30  tablet 3  . fluticasone (FLONASE) 50 MCG/ACT nasal spray Place 2 sprays into both nostrils daily as needed for allergies. 60 mL 1  . Fluticasone-Salmeterol (ADVAIR) 250-50 MCG/DOSE AEPB Inhale 1 puff into the lungs 2 (two) times daily. 60 each 3  . folic acid (FOLVITE) 631 MCG tablet Take 1,600 mcg by mouth daily.    . methotrexate (RHEUMATREX) 2.5 MG tablet Take 20 mg by mouth every Wednesday.     . montelukast (SINGULAIR) 10 MG tablet Take 1 tablet (10 mg total) by mouth at bedtime. 90 tablet 1  . Olopatadine HCl 0.2 % SOLN Place 1 drop into both eyes daily as needed (allergies).    . Omeprazole 20 MG TBEC Take 20 mg by mouth every morning.     . potassium chloride (KLOR-CON) 10 MEQ tablet Take 1 tablet (10 mEq total) by mouth daily.  90 tablet 1  . simvastatin (ZOCOR) 40 MG tablet Take 1 tablet (40 mg total) by mouth at bedtime. 90 tablet 3   No current facility-administered medications for this visit.    OBJECTIVE: There were no vitals filed for this visit.   There is no height or weight on file to calculate BMI.    ECOG FS:0 - Asymptomatic  General: Well-developed, well-nourished, no acute distress. HEENT: Normocephalic. Neuro: Alert, answering all questions appropriately. Cranial nerves grossly intact. Psych: Normal affect.  LAB RESULTS:  Lab Results  Component Value Date   NA 138 04/09/2020   K 4.5 04/09/2020   CL 102 04/09/2020   CO2 27 04/09/2020   GLUCOSE 149 (H) 04/09/2020   BUN 12 04/09/2020   CREATININE 0.82 04/09/2020   CALCIUM 8.5 (L) 04/09/2020   PROT 6.7 03/20/2020   ALBUMIN 4.3 03/20/2020   AST 21 03/20/2020   ALT 10 03/20/2020   ALKPHOS 112 03/20/2020   BILITOT 0.2 03/20/2020   GFRNONAA >60 04/09/2020   GFRAA >60 04/09/2020    Lab Results  Component Value Date   WBC 14.9 (H) 04/09/2020   NEUTROABS 13.6 (H) 02/14/2018   HGB 11.8 (L) 04/09/2020   HCT 37.7 04/09/2020   MCV 98.2 04/09/2020   PLT 185 04/09/2020     STUDIES: No results  found.  ASSESSMENT: DCIS, right breast.  PLAN:    1. DCIS, right breast:Patient underwent lumpectomy followed by adjuvant XRT completing in September 2019. Because there was no invasive component on her pathology, she did not require adjuvant chemotherapy. Patient could not tolerate tamoxifen or letrozole secondary to worsening joint pain, therefore was switched to anastrozole.  She is tolerating this well and will complete 5 years of treatment in September 2024. Her most recent mammogram on Feb 04, 2020 was reported as BI-RADS 2.  Repeat in May 2022.  Return to clinic in 6 months with video assisted telemedicine visit. 2.  Osteopenia: Patient's most recent bone mineral density on March 03, 2020 reported a T score of -1.9 which is slightly worse than previous in February 2020 where the T score was reported -1.4.  Patient admits she does not take calcium and vitamin D supplementation, but agreed to do so.  Repeat bone mineral density in June 2022.  Return to clinic as above.    I provided 20 minutes of face-to-face video visit time during this encounter which included chart review, counseling, and coordination of care as documented above.     Lloyd Huger, MD 08/20/2020 6:56 AM  Cancer Staging Ductal carcinoma in situ (DCIS) of right breast Staging form: Breast, AJCC 8th Edition - Clinical: Stage 0 (cTis (DCIS), cN0, cM0, ER+, PR-, HER2-) - Signed by Lloyd Huger, MD on 03/18/2018

## 2020-08-18 DIAGNOSIS — M4180 Other forms of scoliosis, site unspecified: Secondary | ICD-10-CM | POA: Diagnosis not present

## 2020-08-18 DIAGNOSIS — I10 Essential (primary) hypertension: Secondary | ICD-10-CM | POA: Diagnosis not present

## 2020-08-18 DIAGNOSIS — Z6828 Body mass index (BMI) 28.0-28.9, adult: Secondary | ICD-10-CM | POA: Diagnosis not present

## 2020-08-18 DIAGNOSIS — Z981 Arthrodesis status: Secondary | ICD-10-CM | POA: Diagnosis not present

## 2020-08-19 ENCOUNTER — Inpatient Hospital Stay: Payer: Medicare HMO | Attending: Oncology | Admitting: Oncology

## 2020-08-19 ENCOUNTER — Encounter: Payer: Self-pay | Admitting: Oncology

## 2020-08-19 DIAGNOSIS — Z923 Personal history of irradiation: Secondary | ICD-10-CM | POA: Diagnosis not present

## 2020-08-19 DIAGNOSIS — Z79811 Long term (current) use of aromatase inhibitors: Secondary | ICD-10-CM | POA: Diagnosis not present

## 2020-08-19 DIAGNOSIS — D0511 Intraductal carcinoma in situ of right breast: Secondary | ICD-10-CM | POA: Diagnosis not present

## 2020-08-19 NOTE — Progress Notes (Unsigned)
Patient denies any concerns today.  

## 2020-08-28 ENCOUNTER — Encounter: Payer: Self-pay | Admitting: Nurse Practitioner

## 2020-08-28 ENCOUNTER — Other Ambulatory Visit: Payer: Self-pay

## 2020-08-28 ENCOUNTER — Ambulatory Visit (INDEPENDENT_AMBULATORY_CARE_PROVIDER_SITE_OTHER): Payer: Medicare HMO | Admitting: Nurse Practitioner

## 2020-08-28 VITALS — Temp 94.2°F | Ht 60.0 in | Wt 146.0 lb

## 2020-08-28 DIAGNOSIS — R059 Cough, unspecified: Secondary | ICD-10-CM | POA: Diagnosis not present

## 2020-08-28 DIAGNOSIS — J0140 Acute pansinusitis, unspecified: Secondary | ICD-10-CM

## 2020-08-28 MED ORDER — AMOXICILLIN-POT CLAVULANATE 875-125 MG PO TABS: 1.0000 | ORAL_TABLET | Freq: Two times a day (BID) | ORAL | 0 refills | Status: DC

## 2020-08-28 NOTE — Progress Notes (Signed)
[facility] [place], [place] [address]  Internal MEDICINE  Telephone Visit  Patient Name: Michelle Moore  [id number]  [id number]  Date of Service: 08/28/2020  I connected with the patient at 12:29pm by telephone and verified the patients identity using two identifiers.   I discussed the limitations, risks, security and privacy concerns of performing an evaluation and management service by telephone and the availability of in person appointments. I also discussed with the patient that there may be a patient responsible charge related to the service.  The patient expressed understanding and agrees to proceed.    Chief Complaint  Patient presents with  . Telephone Screen    [phone]  . Telephone Assessment  . Ear Pain  . Sinusitis  . Cough    The patient has been contacted via telephone for follow up visit due to concerns for spread of novel coronavirus. She presents for acute visit. Has a great deal of nasal and sinus congestion. She has post nasal drop. She has headache. Denies fever or chills. She does have cough which is productive at times. She has been taking delsym and coricidin hbp. The delsym seems to help, but coricidin does not seem to be doing anything. She denies nausea or vomiting. States that she generally gets a sinus infection this time of year.       Current Medication: Outpatient Encounter Medications as of 08/28/2020  Medication Sig  . ALPRAZolam (XANAX) 0.25 MG tablet Take 1 tablet (0.25 mg total) by mouth 2 (two) times daily as needed for anxiety.  Marland Kitchen anastrozole (ARIMIDEX) 1 MG tablet Take 1 tablet (1 mg total) by mouth daily.  . cetirizine (ZYRTEC) 10 MG tablet Take 10 mg by mouth daily as needed for allergies.   . cyclobenzaprine (FLEXERIL) 10 MG tablet Take 1 tablet (10 mg total) by mouth 3 (three) times daily as needed for muscle spasms.  Marland Kitchen escitalopram (LEXAPRO) 20 MG tablet Take 1 tablet (20 mg total) by mouth daily.  .  fluticasone (FLONASE) 50 MCG/ACT nasal spray Place 2 sprays into both nostrils daily as needed for allergies.  . Fluticasone-Salmeterol (ADVAIR) 250-50 MCG/DOSE AEPB Inhale 1 puff into the lungs 2 (two) times daily.  . folic acid (FOLVITE) 456 MCG tablet Take 1,600 mcg by mouth daily.  . methotrexate (RHEUMATREX) 2.5 MG tablet Take 20 mg by mouth every Wednesday.   . montelukast (SINGULAIR) 10 MG tablet Take 1 tablet (10 mg total) by mouth at bedtime.  . Olopatadine HCl 0.2 % SOLN Place 1 drop into both eyes daily as needed (allergies).  . Omeprazole 20 MG TBEC Take 20 mg by mouth every morning.   . potassium chloride (KLOR-CON) 10 MEQ tablet Take 1 tablet (10 mEq total) by mouth daily.  . simvastatin (ZOCOR) 40 MG tablet Take 1 tablet (40 mg total) by mouth at bedtime.  Marland Kitchen amoxicillin-clavulanate (AUGMENTIN) 875-125 MG tablet Take 1 tablet by mouth 2 (two) times daily.   No facility-administered encounter medications on file as of 08/28/2020.    Surgical History: Past Surgical History:  Procedure Laterality Date  . ABDOMINAL HYSTERECTOMY    . ANTERIOR CERVICAL DECOMP/DISCECTOMY FUSION N/A 01/20/2014   Procedure: ANTERIOR CERVICAL DECOMPRESSION/DISCECTOMY FUSION 2 LEVELS cervical five/six six Tarry Kos;  Surgeon: Ophelia Charter, MD;  Location: [facility];  Service: Neurosurgery;  Laterality: N/A;  . BACK SURGERY     lumbar  . 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 BREAST BIOPSY  01/16/2019   coil, BENIGN MAMMARY TISSUE WITH MODERATE STROMAL FIBROSIS AND COARSE DYSTROPHIC CALCIFICATIONS of the RIGHT breast  . BREAST LUMPECTOMY Right 03/07/2018   Procedure: BREAST LUMPECTOMY/ RE EXCISION;  Surgeon: Herbert Pun, MD;  Location: [facility];  Service: General;  Laterality: Right;  . CARDIAC CATHETERIZATION     2011  . CORONARY ANGIOPLASTY WITH STENT PLACEMENT     PLACED IN RCA 2011  . EYE SURGERY     lasik  . PARTIAL MASTECTOMY WITH NEEDLE LOCALIZATION Right 02/21/2018    Procedure: PARTIAL MASTECTOMY WITH NEEDLE LOCALIZATION;  Surgeon: Herbert Pun, MD;  Location: [facility] ORS;  Service: General;  Laterality: Right;  . ROTATOR CUFF REPAIR     left shoulder  . SHOULDER ARTHROSCOPY WITH ROTATOR CUFF REPAIR AND OPEN BICEPS TENODESIS Right 08/07/2019   Procedure: SHOULDER ARTHROSCOPY WITH DEBRIDEMENT, DECOMPRESSION, ROTATOR CUFF REPAIR AND BICEPS TENODESIS. - RNFA;  Surgeon: Corky Mull, MD;  Location: [facility] ORS;  Service: Orthopedics;  Laterality: Right;  . TOE SURGERY Right    has pin and plate in it    Medical History: Past Medical History:  Diagnosis Date  . Anxiety   . Arthritis   . Cancer ([place])    breast-right   . COPD (chronic obstructive pulmonary disease) ([place])   . Coronary artery disease   . Dyspnea    ONLY WITH EXERTION  . GERD (gastroesophageal reflux disease)    "sometimes"  . Headache(784.0)    sinus headaches  . Myocardial infarction ([place])    in 2011  no damage-1 stent  . Personal history of radiation therapy   . Seizures ([place]) 2014   X1-no idea what caused her seizure  . Sleep apnea    USES CPAP    Family History: Family History  Problem Relation Age of Onset  . Breast cancer Cousin 54       bilateral at 43 and 4; daughter of maternal aunt who was unaffected  . Lung cancer Maternal Aunt        dx 87s; deceased 53s; smoker  . Heart attack Father        deceased 36  . Stomach cancer Maternal Grandmother     Social History   Socioeconomic History  . Marital status: Married    Spouse name: Not on file  . Number of children: Not on file  . Years of education: Not on file  . Highest education level: Not on file  Occupational History  . Not on file  Tobacco Use  . Smoking status: Former Smoker    Packs/day: 1.00    Years: 30.00    Pack years: 30.00    Types: Cigarettes    Quit date: 09/13/2015    Years since quitting: 4.9  . Smokeless tobacco: Never Used  Vaping Use  . Vaping Use: Never used  Substance  and Sexual Activity  . Alcohol use: Yes    Comment: occasional  . Drug use: No  . Sexual activity: Not on file  Other Topics Concern  . Not on file  Social History Narrative  . Not on file   Social Determinants of Health   Financial Resource Strain: Not on file  Food Insecurity: Not on file  Transportation Needs: Not on file  Physical Activity: Not on file  Stress: Not on file  Social Connections: Not on file  Intimate Partner Violence: Not on file      Review of Systems  Constitutional: Positive for chills and fatigue. Negative for fever.  HENT: Positive for congestion, ear pain, postnasal drip, rhinorrhea, sinus pressure, sinus pain and sore throat. Negative for voice change.   Respiratory: Positive for cough and wheezing.   Cardiovascular: Negative for chest pain and palpitations.  Gastrointestinal: Positive for nausea.  Musculoskeletal: Positive for arthralgias and myalgias.  Allergic/Immunologic: Positive for environmental allergies.  Neurological: Positive for headaches.  Hematological: Positive for adenopathy.    Today's Vitals   08/28/20 1217  Temp: (!) 94.2 F (34.6 C)  Weight: 146 lb (66.2 kg)  Height: 5' (1.524 m)   Body mass index is 28.51 kg/m.  Observation/Objective:   The patient is alert and oriented. She is pleasant and answers all questions appropriately. Breathing is non-labored. She is in no acute distress at this time.  She is nasally congested with dry, harsh cough.    Assessment/Plan:  1. Acute non-recurrent pansinusitis Start augmentin 875mg  twice daiy for 10 days. Rest and increase fluids. Continue to take OTC medications as needed and as indicated for acute symptoms.  - amoxicillin-clavulanate (AUGMENTIN) 875-125 MG tablet; Take 1 tablet by mouth 2 (two) times daily.  Dispense: 20 tablet; Refill: 0  2. Cough Continue to take OTC delsym as needed for cough.   General Counseling: sunni richardson understanding of the findings of  today's phone visit and agrees with plan of treatment. I have discussed any further diagnostic evaluation that may be needed or ordered today. We also reviewed her medications today. she has been encouraged to call the office with any questions or concerns that should arise related to todays visit.   This patient was seen by [place] with Dr Lavera Guise as a part of collaborative care agreement  Meds ordered this encounter  Medications  . amoxicillin-clavulanate (AUGMENTIN) 875-125 MG tablet    Sig: Take 1 tablet by mouth 2 (two) times daily.    Dispense:  20 tablet    Refill:  0    Order Specific Question:   Supervising Provider    Answer:   Lavera Guise [[id number]]    Time spent:22 Minutes    Dr Lavera Guise Internal medicine

## 2020-09-30 DIAGNOSIS — I6523 Occlusion and stenosis of bilateral carotid arteries: Secondary | ICD-10-CM | POA: Diagnosis not present

## 2020-09-30 DIAGNOSIS — Z9989 Dependence on other enabling machines and devices: Secondary | ICD-10-CM | POA: Diagnosis not present

## 2020-09-30 DIAGNOSIS — G4733 Obstructive sleep apnea (adult) (pediatric): Secondary | ICD-10-CM | POA: Diagnosis not present

## 2020-09-30 DIAGNOSIS — E782 Mixed hyperlipidemia: Secondary | ICD-10-CM | POA: Diagnosis not present

## 2020-09-30 DIAGNOSIS — I2510 Atherosclerotic heart disease of native coronary artery without angina pectoris: Secondary | ICD-10-CM | POA: Diagnosis not present

## 2020-10-07 ENCOUNTER — Other Ambulatory Visit: Payer: Self-pay | Admitting: *Deleted

## 2020-10-07 MED ORDER — ANASTROZOLE 1 MG PO TABS: 1.0000 mg | ORAL_TABLET | Freq: Every day | ORAL | 1 refills | Status: DC

## 2020-10-19 ENCOUNTER — Other Ambulatory Visit: Payer: Self-pay

## 2020-10-19 DIAGNOSIS — J0140 Acute pansinusitis, unspecified: Secondary | ICD-10-CM

## 2020-10-19 MED ORDER — AMOXICILLIN-POT CLAVULANATE 875-125 MG PO TABS: 1.0000 | ORAL_TABLET | Freq: Two times a day (BID) | ORAL | 0 refills | Status: DC

## 2020-10-19 NOTE — Telephone Encounter (Signed)
As per dr Humphrey Rolls send augmentin  For sinus infection

## 2020-10-29 ENCOUNTER — Other Ambulatory Visit: Payer: Self-pay

## 2020-10-29 MED ORDER — FLUTICASONE-SALMETEROL 250-50 MCG/DOSE IN AEPB: 1.0000 | INHALATION_SPRAY | Freq: Two times a day (BID) | RESPIRATORY_TRACT | 3 refills | Status: DC

## 2020-11-03 ENCOUNTER — Ambulatory Visit: Payer: Medicare HMO | Admitting: Hospice and Palliative Medicine

## 2020-11-10 DIAGNOSIS — M0579 Rheumatoid arthritis with rheumatoid factor of multiple sites without organ or systems involvement: Secondary | ICD-10-CM | POA: Diagnosis not present

## 2020-11-10 DIAGNOSIS — Z79899 Other long term (current) drug therapy: Secondary | ICD-10-CM | POA: Diagnosis not present

## 2020-11-17 DIAGNOSIS — M8949 Other hypertrophic osteoarthropathy, multiple sites: Secondary | ICD-10-CM | POA: Diagnosis not present

## 2020-11-17 DIAGNOSIS — Z79899 Other long term (current) drug therapy: Secondary | ICD-10-CM | POA: Diagnosis not present

## 2020-11-17 DIAGNOSIS — I6523 Occlusion and stenosis of bilateral carotid arteries: Secondary | ICD-10-CM | POA: Diagnosis not present

## 2020-11-17 DIAGNOSIS — Z170 Estrogen receptor positive status [ER+]: Secondary | ICD-10-CM | POA: Diagnosis not present

## 2020-11-17 DIAGNOSIS — M0579 Rheumatoid arthritis with rheumatoid factor of multiple sites without organ or systems involvement: Secondary | ICD-10-CM | POA: Diagnosis not present

## 2020-11-17 DIAGNOSIS — C50911 Malignant neoplasm of unspecified site of right female breast: Secondary | ICD-10-CM | POA: Diagnosis not present

## 2020-11-27 ENCOUNTER — Telehealth: Payer: Self-pay | Admitting: Internal Medicine

## 2020-11-27 NOTE — Progress Notes (Signed)
  Chronic Care Management   Note  11/27/2020 Name: Michelle Moore MRN: [id number] DOB: [birth year]  Michelle Moore is a 65 y.o. year old female who is a primary care patient of Lavera Guise, MD. I reached out to [name] by phone today in response to a referral sent by Michelle Moore's PCP, Lavera Guise, MD.   [name] was given information about Chronic Care Management services today including:  1. CCM service includes personalized support from designated clinical staff supervised by her physician, including individualized plan of care and coordination with other care providers 2. 24/7 contact phone numbers for assistance for urgent and routine care needs. 3. Service will only be billed when office clinical staff spend 20 minutes or more in a month to coordinate care. 4. Only one practitioner may furnish and bill the service in a calendar month. 5. The patient may stop CCM services at any time (effective at the end of the month) by phone call to the office staff.   Patient did not agree to enrollment in care management services and does not wish to consider at this time.  Follow up plan:   Carley Perdue [employer] Scheduler

## 2020-12-15 ENCOUNTER — Ambulatory Visit: Payer: Medicare HMO | Admitting: Hospice and Palliative Medicine

## 2020-12-22 ENCOUNTER — Ambulatory Visit (INDEPENDENT_AMBULATORY_CARE_PROVIDER_SITE_OTHER): Payer: Medicare HMO | Admitting: Hospice and Palliative Medicine

## 2020-12-22 ENCOUNTER — Encounter: Payer: Self-pay | Admitting: Hospice and Palliative Medicine

## 2020-12-22 VITALS — BP 134/72 | HR 100 | Temp 98.2°F | Resp 16 | Ht 60.0 in | Wt 146.8 lb

## 2020-12-22 DIAGNOSIS — Z1211 Encounter for screening for malignant neoplasm of colon: Secondary | ICD-10-CM | POA: Diagnosis not present

## 2020-12-22 DIAGNOSIS — R5383 Other fatigue: Secondary | ICD-10-CM

## 2020-12-22 DIAGNOSIS — F411 Generalized anxiety disorder: Secondary | ICD-10-CM | POA: Diagnosis not present

## 2020-12-22 DIAGNOSIS — J0140 Acute pansinusitis, unspecified: Secondary | ICD-10-CM

## 2020-12-22 DIAGNOSIS — Z79899 Other long term (current) drug therapy: Secondary | ICD-10-CM | POA: Diagnosis not present

## 2020-12-22 LAB — POCT URINE DRUG SCREEN
Methylenedioxyamphetamine: NOT DETECTED
POC Amphetamine UR: NOT DETECTED
POC BENZODIAZEPINES UR: POSITIVE — AB
POC Barbiturate UR: NOT DETECTED
POC Cocaine UR: NOT DETECTED
POC Ecstasy UR: NOT DETECTED
POC Marijuana UR: NOT DETECTED
POC Methadone UR: NOT DETECTED
POC Methamphetamine UR: NOT DETECTED
POC Opiate Ur: NOT DETECTED
POC Oxycodone UR: NOT DETECTED
POC PHENCYCLIDINE UR: NOT DETECTED
POC TRICYCLICS UR: NOT DETECTED

## 2020-12-22 MED ORDER — PREDNISONE 10 MG PO TABS: ORAL_TABLET | ORAL | 0 refills | Status: DC

## 2020-12-22 MED ORDER — AMOXICILLIN-POT CLAVULANATE 875-125 MG PO TABS: 1.0000 | ORAL_TABLET | Freq: Two times a day (BID) | ORAL | 0 refills | Status: DC

## 2020-12-22 MED ORDER — ALPRAZOLAM 0.25 MG PO TABS
0.2500 mg | ORAL_TABLET | Freq: Two times a day (BID) | ORAL | 1 refills | Status: DC | PRN
Start: 2020-12-22 — End: 2021-03-01

## 2020-12-22 NOTE — Progress Notes (Signed)
[facility] [place], [place] [address]  Internal MEDICINE  Office Visit Note  Patient Name: Michelle Moore  [id number]  [id number]  Date of Service: 12/23/2020  Chief Complaint  Patient presents with  . Follow-up    Sinus infection, cough, stuffy nose, started Thursday   . Gastroesophageal Reflux  . Sleep Apnea  . COPD  . Anxiety    HPI Patient is here for routine follow-up Complaining of sinus pressure and pain, headaches, nasal congestion and productive coughing--symptoms started last week, has been taking OTC medications without much relief in their symptoms Denies shortness of breath, wheezing or difficulty breathing Requesting refills of alprazolam she takes as needed for anxiety, continues on Lexapro daily which also helps control her anxiety Due for screening colonoscopy and routine labs  Followed by oncology for breast cancer-remains stable, continues on Anastrozole Advised to take OTC vitamin D and calcium for osteopenia Followed by neurosurgery for chronic back pain with surgical intervention  Current Medication: Outpatient Encounter Medications as of 12/22/2020  Medication Sig  . amoxicillin-clavulanate (AUGMENTIN) 875-125 MG tablet Take 1 tablet by mouth 2 (two) times daily.  . predniSONE (DELTASONE) 10 MG tablet Take 1 tablet three times a day with a meal for three for three days, take 1 tablet by twice daily with a meal for 3 days, take 1 tablet once daily with a meal for 3 days  . ALPRAZolam (XANAX) 0.25 MG tablet Take 1 tablet (0.25 mg total) by mouth 2 (two) times daily as needed for anxiety.  Marland Kitchen anastrozole (ARIMIDEX) 1 MG tablet Take 1 tablet (1 mg total) by mouth daily.  . cetirizine (ZYRTEC) 10 MG tablet Take 10 mg by mouth daily as needed for allergies.   . cyclobenzaprine (FLEXERIL) 10 MG tablet Take 1 tablet (10 mg total) by mouth 3 (three) times daily as needed for muscle spasms.  Marland Kitchen escitalopram (LEXAPRO) 20 MG tablet Take 1  tablet (20 mg total) by mouth daily.  . fluticasone (FLONASE) 50 MCG/ACT nasal spray Place 2 sprays into both nostrils daily as needed for allergies.  . Fluticasone-Salmeterol (ADVAIR) 250-50 MCG/DOSE AEPB Inhale 1 puff into the lungs 2 (two) times daily.  . folic acid (FOLVITE) 295 MCG tablet Take 1,600 mcg by mouth daily.  . methotrexate (RHEUMATREX) 2.5 MG tablet Take 20 mg by mouth every Wednesday.   . montelukast (SINGULAIR) 10 MG tablet Take 1 tablet (10 mg total) by mouth at bedtime.  . Olopatadine HCl 0.2 % SOLN Place 1 drop into both eyes daily as needed (allergies).  . Omeprazole 20 MG TBEC Take 20 mg by mouth every morning.   . potassium chloride (KLOR-CON) 10 MEQ tablet Take 1 tablet (10 mEq total) by mouth daily.  . simvastatin (ZOCOR) 40 MG tablet Take 1 tablet (40 mg total) by mouth at bedtime.  . [DISCONTINUED] ALPRAZolam (XANAX) 0.25 MG tablet Take 1 tablet (0.25 mg total) by mouth 2 (two) times daily as needed for anxiety.  . [DISCONTINUED] amoxicillin-clavulanate (AUGMENTIN) 875-125 MG tablet Take 1 tablet by mouth 2 (two) times daily.   No facility-administered encounter medications on file as of 12/22/2020.    Surgical History: Past Surgical History:  Procedure Laterality Date  . ABDOMINAL HYSTERECTOMY    . ANTERIOR CERVICAL DECOMP/DISCECTOMY FUSION N/A 01/20/2014   Procedure: ANTERIOR CERVICAL DECOMPRESSION/DISCECTOMY FUSION 2 LEVELS cervical five/six six Tarry Kos;  Surgeon: Ophelia Charter, MD;  Location: [place] [facility];  Service: Neurosurgery;  Laterality: N/A;  . BACK SURGERY  lumbar  . 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 BREAST BIOPSY  01/16/2019   coil, BENIGN MAMMARY TISSUE WITH MODERATE STROMAL FIBROSIS AND COARSE DYSTROPHIC CALCIFICATIONS of the RIGHT breast  . BREAST LUMPECTOMY Right 03/07/2018   Procedure: BREAST LUMPECTOMY/ RE EXCISION;  Surgeon: Herbert Pun, MD;  Location: [facility] ORS;  Service: General;  Laterality: Right;  .  CARDIAC CATHETERIZATION     2011  . CORONARY ANGIOPLASTY WITH STENT PLACEMENT     PLACED IN RCA 2011  . EYE SURGERY     lasik  . PARTIAL MASTECTOMY WITH NEEDLE LOCALIZATION Right 02/21/2018   Procedure: PARTIAL MASTECTOMY WITH NEEDLE LOCALIZATION;  Surgeon: Herbert Pun, MD;  Location: [facility] ORS;  Service: General;  Laterality: Right;  . ROTATOR CUFF REPAIR     left shoulder  . SHOULDER ARTHROSCOPY WITH ROTATOR CUFF REPAIR AND OPEN BICEPS TENODESIS Right 08/07/2019   Procedure: SHOULDER ARTHROSCOPY WITH DEBRIDEMENT, DECOMPRESSION, ROTATOR CUFF REPAIR AND BICEPS TENODESIS. - RNFA;  Surgeon: Corky Mull, MD;  Location: [facility] ORS;  Service: Orthopedics;  Laterality: Right;  . TOE SURGERY Right    has pin and plate in it    Medical History: Past Medical History:  Diagnosis Date  . Anxiety   . Arthritis   . Cancer ([place])    breast-right   . COPD (chronic obstructive pulmonary disease) ([place])   . Coronary artery disease   . Dyspnea    ONLY WITH EXERTION  . GERD (gastroesophageal reflux disease)    "sometimes"  . Headache(784.0)    sinus headaches  . Myocardial infarction ([place])    in 2011  no damage-1 stent  . Personal history of radiation therapy   . Seizures ([place]) 2014   X1-no idea what caused her seizure  . Sleep apnea    USES CPAP    Family History: Family History  Problem Relation Age of Onset  . Breast cancer Cousin 45       bilateral at 43 and 33; daughter of maternal aunt who was unaffected  . Lung cancer Maternal Aunt        dx 13s; deceased 58s; smoker  . Heart attack Father        deceased 57  . Stomach cancer Maternal Grandmother     Social History   Socioeconomic History  . Marital status: Married    Spouse name: Not on file  . Number of children: Not on file  . Years of education: Not on file  . Highest education level: Not on file  Occupational History  . Not on file  Tobacco Use  . Smoking status: Former Smoker    Packs/day: 1.00     Years: 30.00    Pack years: 30.00    Types: Cigarettes    Quit date: 09/13/2015    Years since quitting: 5.2  . Smokeless tobacco: Never Used  Vaping Use  . Vaping Use: Never used  Substance and Sexual Activity  . Alcohol use: Yes    Comment: occasional  . Drug use: No  . Sexual activity: Not on file  Other Topics Concern  . Not on file  Social History Narrative  . Not on file   Social Determinants of Health   Financial Resource Strain: Not on file  Food Insecurity: Not on file  Transportation Needs: Not on file  Physical Activity: Not on file  Stress: Not on file  Social Connections: Not on file  Intimate Partner Violence: Not on file  Review of Systems  Constitutional: Negative for chills, diaphoresis and fatigue.  HENT: Positive for congestion, sinus pressure and sinus pain. Negative for ear pain and postnasal drip.   Eyes: Negative for photophobia, discharge, redness, itching and visual disturbance.  Respiratory: Positive for choking. Negative for cough, shortness of breath and wheezing.   Cardiovascular: Negative for chest pain, palpitations and leg swelling.  Gastrointestinal: Negative for abdominal pain, constipation, diarrhea, nausea and vomiting.  Genitourinary: Negative for dysuria and flank pain.  Musculoskeletal: Negative for arthralgias, back pain, gait problem and neck pain.  Skin: Negative for color change.  Allergic/Immunologic: Negative for environmental allergies and food allergies.  Neurological: Positive for headaches. Negative for dizziness.  Hematological: Does not bruise/bleed easily.  Psychiatric/Behavioral: Negative for agitation, behavioral problems (depression) and hallucinations.    Vital Signs: BP 134/72   Pulse 100   Temp 98.2 F (36.8 C)   Resp 16   Ht 5' (1.524 m)   Wt 146 lb 12.8 oz (66.6 kg)   SpO2 96%   BMI 28.67 kg/m    Physical Exam Vitals reviewed.  Constitutional:      Appearance: Normal appearance. She is normal  weight.  HENT:     Nose: Congestion present.     Mouth/Throat:     Mouth: Mucous membranes are moist.     Pharynx: Posterior oropharyngeal erythema present.  Cardiovascular:     Rate and Rhythm: Normal rate and regular rhythm.     Pulses: Normal pulses.     Heart sounds: Normal heart sounds.  Pulmonary:     Effort: Pulmonary effort is normal.     Breath sounds: Normal breath sounds.  Abdominal:     General: Abdomen is flat.     Palpations: Abdomen is soft.  Musculoskeletal:        General: Normal range of motion.     Cervical back: Normal range of motion.  Skin:    General: Skin is warm.  Neurological:     General: No focal deficit present.     Mental Status: She is alert and oriented to person, place, and time. Mental status is at baseline.  Psychiatric:        Mood and Affect: Mood normal.        Behavior: Behavior normal.        Thought Content: Thought content normal.        Judgment: Judgment normal.     Assessment/Plan: 1. Acute non-recurrent pansinusitis Complete course of Augmentin as well as prednisone Advised to contact office if symptoms worsen or have not improved within 10 days Will need PFT and possible low dose CT for history of cigarette smoking - amoxicillin-clavulanate (AUGMENTIN) 875-125 MG tablet; Take 1 tablet by mouth 2 (two) times daily.  Dispense: 20 tablet; Refill: 0 - predniSONE (DELTASONE) 10 MG tablet; Take 1 tablet three times a day with a meal for three for three days, take 1 tablet by twice daily with a meal for 3 days, take 1 tablet once daily with a meal for 3 days  Dispense: 18 tablet; Refill: 0  2. Generalized anxiety disorder Continue with Lexapro and alprazolam as needed Continue discussions on weaning use of alprazolam--will need to optimize daily therapy [place] Controlled Substance Database was reviewed by me for overdose risk score (ORS) Reviewed risks and possible side effects associated with taking opiates, benzodiazepines and other  CNS depressants. Combination of these could cause dizziness and drowsiness. Advised patient not to drive or operate machinery when taking these  medications, as patient's and other's life can be at risk and will have consequences. Patient verbalized understanding in this matter. Dependence and abuse for these drugs will be monitored closely. A Controlled substance policy and procedure is on file which allows [facility] to order a urine drug screen test at any visit. Patient understands and agrees with the plan - ALPRAZolam (XANAX) 0.25 MG tablet; Take 1 tablet (0.25 mg total) by mouth 2 (two) times daily as needed for anxiety.  Dispense: 45 tablet; Refill: 1  3. Screening for colon cancer - Ambulatory referral to Gastroenterology  4. Encounter for long-term (current) use of medications - POCT Urine Drug Screen  5. Other fatigue - CBC w/Diff/Platelet - Comprehensive Metabolic Panel (CMET) - Lipid Panel With LDL/HDL Ratio - TSH + free T4  General Counseling: Talyssa verbalizes understanding of the findings of todays visit and agrees with plan of treatment. I have discussed any further diagnostic evaluation that may be needed or ordered today. We also reviewed her medications today. she has been encouraged to call the office with any questions or concerns that should arise related to todays visit.    Orders Placed This Encounter  Procedures  . CBC w/Diff/Platelet  . Comprehensive Metabolic Panel (CMET)  . Lipid Panel With LDL/HDL Ratio  . TSH + free T4  . Ambulatory referral to Gastroenterology  . POCT Urine Drug Screen    Meds ordered this encounter  Medications  . amoxicillin-clavulanate (AUGMENTIN) 875-125 MG tablet    Sig: Take 1 tablet by mouth 2 (two) times daily.    Dispense:  20 tablet    Refill:  0  . predniSONE (DELTASONE) 10 MG tablet    Sig: Take 1 tablet three times a day with a meal for three for three days, take 1 tablet by twice daily with a meal for 3 days,  take 1 tablet once daily with a meal for 3 days    Dispense:  18 tablet    Refill:  0  . ALPRAZolam (XANAX) 0.25 MG tablet    Sig: Take 1 tablet (0.25 mg total) by mouth 2 (two) times daily as needed for anxiety.    Dispense:  45 tablet    Refill:  1    Time spent: 30 Minutes Time spent includes review of chart, medications, test results and follow-up plan with the patient.  This patient was seen by Theodoro Grist AGNP-C in Collaboration with Dr Lavera Guise as a part of collaborative care agreement     Tanna Furry. Shawan Tosh AGNP-C Internal medicine

## 2020-12-23 ENCOUNTER — Encounter: Payer: Self-pay | Admitting: Hospice and Palliative Medicine

## 2020-12-28 ENCOUNTER — Telehealth: Payer: Self-pay | Admitting: Gastroenterology

## 2020-12-28 ENCOUNTER — Telehealth: Payer: Self-pay

## 2020-12-28 ENCOUNTER — Telehealth: Payer: Medicare HMO

## 2020-12-28 NOTE — Telephone Encounter (Signed)
hey ladies... please cancel Michelle Moore triage nurse visit. She needs an office visit: 1. Constipation 2. Left sided pain    Called LVM for patient tcb to schedule.

## 2020-12-28 NOTE — Telephone Encounter (Signed)
Patient was contacted this morning to schedule her colonoscopy.  During triage patient stated that she was experiencing left sided pain, and constipation.  She just started taking Miralax.  Her bowel movements are 1-2 week.  She does contribute this to her diet.    Patient has been informed that front office will call to schedule an office visit to address her symptoms and colonoscopy will be scheduled during the office visit.  Thanks,  [place], [place]

## 2021-01-01 NOTE — Telephone Encounter (Signed)
error 

## 2021-01-14 ENCOUNTER — Other Ambulatory Visit: Payer: Self-pay | Admitting: Hospice and Palliative Medicine

## 2021-01-14 ENCOUNTER — Other Ambulatory Visit: Payer: Self-pay | Admitting: Nurse Practitioner

## 2021-01-14 ENCOUNTER — Other Ambulatory Visit: Payer: Self-pay

## 2021-01-14 ENCOUNTER — Other Ambulatory Visit: Payer: Self-pay | Admitting: *Deleted

## 2021-01-14 DIAGNOSIS — J3089 Other allergic rhinitis: Secondary | ICD-10-CM

## 2021-01-14 DIAGNOSIS — F411 Generalized anxiety disorder: Secondary | ICD-10-CM

## 2021-01-14 MED ORDER — ANASTROZOLE 1 MG PO TABS: 1.0000 mg | ORAL_TABLET | Freq: Every day | ORAL | 0 refills | Status: DC

## 2021-01-14 MED ORDER — MONTELUKAST SODIUM 10 MG PO TABS: 10.0000 mg | ORAL_TABLET | Freq: Every day | ORAL | 1 refills | Status: DC

## 2021-01-21 ENCOUNTER — Other Ambulatory Visit: Payer: Self-pay

## 2021-01-21 ENCOUNTER — Ambulatory Visit: Payer: Medicare HMO | Admitting: Gastroenterology

## 2021-01-21 ENCOUNTER — Encounter: Payer: Self-pay | Admitting: Gastroenterology

## 2021-01-21 VITALS — BP 126/75 | HR 91 | Temp 97.8°F | Ht 60.0 in | Wt 146.8 lb

## 2021-01-21 DIAGNOSIS — E782 Mixed hyperlipidemia: Secondary | ICD-10-CM | POA: Diagnosis not present

## 2021-01-21 DIAGNOSIS — K5904 Chronic idiopathic constipation: Secondary | ICD-10-CM

## 2021-01-21 DIAGNOSIS — Z800 Family history of malignant neoplasm of digestive organs: Secondary | ICD-10-CM

## 2021-01-21 DIAGNOSIS — I2510 Atherosclerotic heart disease of native coronary artery without angina pectoris: Secondary | ICD-10-CM | POA: Diagnosis not present

## 2021-01-21 DIAGNOSIS — I6523 Occlusion and stenosis of bilateral carotid arteries: Secondary | ICD-10-CM | POA: Diagnosis not present

## 2021-01-21 MED ORDER — SUPREP BOWEL PREP KIT 17.5-3.13-1.6 GM/177ML PO SOLN: 1.0000 | ORAL | 0 refills | Status: DC

## 2021-01-21 NOTE — Progress Notes (Signed)
Gastroenterology Consultation  Referring Provider:     Lavera Guise, MD Primary Care Physician:  Lavera Guise, MD Primary Gastroenterologist:  Dr. Allen Norris     Reason for Consultation:     Constipation        HPI:   Michelle Moore is a 65 y.o. y/o female referred for consultation & management of Constipation by Dr. Humphrey Rolls, Timoteo Gaul, MD.  This patient comes in today with a long-standing history of constipation.  The patient states that she has relief from her constipation when she eats strawberries.  The patient has not had a colonoscopy on record. She reports that she has a family history of colon cancer in her brother under the age of 46.  She denies any black stools or bloody stools.  She denies any abdominal pain nausea vomiting fevers or chills.  She states that the constipation has been long-standing and has not something new.  The patient's most recent labs from 2 months ago showed a normal hemoglobin and hematocrit. The patient's liver enzymes are also normal when last checked.  Past Medical History:  Diagnosis Date  . Anxiety   . Arthritis   . Cancer ([place])    breast-right   . COPD (chronic obstructive pulmonary disease) ([place])   . Coronary artery disease   . Dyspnea    ONLY WITH EXERTION  . GERD (gastroesophageal reflux disease)    "sometimes"  . Headache(784.0)    sinus headaches  . Myocardial infarction ([place])    in 2011  no damage-1 stent  . Personal history of radiation therapy   . Seizures ([place]) 2014   X1-no idea what caused her seizure  . Sleep apnea    USES CPAP    Past Surgical History:  Procedure Laterality Date  . ABDOMINAL HYSTERECTOMY    . ANTERIOR CERVICAL DECOMP/DISCECTOMY FUSION N/A 01/20/2014   Procedure: ANTERIOR CERVICAL DECOMPRESSION/DISCECTOMY FUSION 2 LEVELS cervical five/six six Tarry Kos;  Surgeon: Ophelia Charter, MD;  Location: [place] [facility];  Service: Neurosurgery;  Laterality: N/A;  . BACK SURGERY     lumbar  . BREAST BIOPSY Right 02/01/2018    x shape, DCIS   . BREAST BIOPSY  01/16/2019   coil, BENIGN MAMMARY TISSUE WITH MODERATE STROMAL FIBROSIS AND COARSE DYSTROPHIC CALCIFICATIONS of the RIGHT breast  . BREAST LUMPECTOMY Right 03/07/2018   Procedure: BREAST LUMPECTOMY/ RE EXCISION;  Surgeon: Herbert Pun, MD;  Location: [facility]S;  Service: General;  Laterality: Right;  . CARDIAC CATHETERIZATION     2011  . CORONARY ANGIOPLASTY WITH STENT PLACEMENT     PLACED IN RCA 2011  . EYE SURGERY     lasik  . PARTIAL MASTECTOMY WITH NEEDLE LOCALIZATION Right 02/21/2018   Procedure: PARTIAL MASTECTOMY WITH NEEDLE LOCALIZATION;  Surgeon: Herbert Pun, MD;  Location: [facility] ORS;  Service: General;  Laterality: Right;  . ROTATOR CUFF REPAIR     left shoulder  . SHOULDER ARTHROSCOPY WITH ROTATOR CUFF REPAIR AND OPEN BICEPS TENODESIS Right 08/07/2019   Procedure: SHOULDER ARTHROSCOPY WITH DEBRIDEMENT, DECOMPRESSION, ROTATOR CUFF REPAIR AND BICEPS TENODESIS. - RNFA;  Surgeon: Corky Mull, MD;  Location: [facility]S;  Service: Orthopedics;  Laterality: Right;  . TOE SURGERY Right    has pin and plate in it    Prior to Admission medications   Medication Sig Start Date End Date Taking? Authorizing Provider  ALPRAZolam (XANAX) 0.25 MG tablet Take 1 tablet (0.25 mg total) by mouth 2 (two) times daily as needed  for anxiety. 12/22/20  Yes Luiz Ochoa, NP  anastrozole (ARIMIDEX) 1 MG tablet Take 1 tablet (1 mg total) by mouth daily. 01/14/21  Yes Lloyd Huger, MD  cetirizine (ZYRTEC) 10 MG tablet Take 10 mg by mouth daily as needed for allergies.    Yes [provider]  cyclobenzaprine (FLEXERIL) 10 MG tablet Take 1 tablet (10 mg total) by mouth 3 (three) times daily as needed for muscle spasms. 04/09/20  Yes Viona Gilmore D, NP  escitalopram (LEXAPRO) 20 MG tablet TAKE (1) TABLET BY MOUTH EVERY DAY 01/14/21  Yes Lavera Guise, MD  fluticasone [facility]) 50 MCG/ACT nasal spray Place 2 sprays into both nostrils daily as  needed for allergies. 09/09/19  Yes Boscia, Greer Ee, NP  Fluticasone-Salmeterol (ADVAIR) 250-50 MCG/DOSE AEPB Inhale 1 puff into the lungs 2 (two) times daily. 10/29/20  Yes Lavera Guise, MD  folic acid (FOLVITE) 161 MCG tablet Take 1,600 mcg by mouth daily.   Yes [provider]  methotrexate (RHEUMATREX) 2.5 MG tablet Take 20 mg by mouth every Wednesday.  10/02/17  Yes [provider]  montelukast (SINGULAIR) 10 MG tablet Take 1 tablet (10 mg total) by mouth at bedtime. 01/14/21  Yes Lavera Guise, MD  Olopatadine HCl 0.2 % SOLN Place 1 drop into both eyes daily as needed (allergies).   Yes [provider]  Omeprazole 20 MG TBEC Take 20 mg by mouth every morning.    Yes [provider]  potassium chloride (KLOR-CON) 10 MEQ tablet Take 1 tablet (10 mEq total) by mouth daily. 07/02/20  Yes Boscia, Greer Ee, NP  simvastatin (ZOCOR) 40 MG tablet Take 1 tablet (40 mg total) by mouth at bedtime. 06/02/20  Yes Boscia, Heather E, NP  Na Sulfate-K Sulfate-Mg Sulf (SUPREP BOWEL PREP KIT) 17.5-3.13-1.6 GM/177ML SOLN Take 1 kit by mouth as directed. 01/21/21   Lucilla Lame, MD    Family History  Problem Relation Age of Onset  . Breast cancer Cousin 80       bilateral at 36 and 50; daughter of maternal aunt who was unaffected  . Lung cancer Maternal Aunt        dx 7s; deceased 14s; smoker  . Heart attack Father        deceased 36  . Stomach cancer Maternal Grandmother      Social History   Tobacco Use  . Smoking status: Former Smoker    Packs/day: 1.00    Years: 30.00    Pack years: 30.00    Types: Cigarettes    Quit date: 09/13/2015    Years since quitting: 5.3  . Smokeless tobacco: Never Used  Vaping Use  . Vaping Use: Never used  Substance Use Topics  . Alcohol use: Yes    Comment: occasional  . Drug use: No    Allergies as of 01/21/2021 - Review Complete 01/21/2021  Allergen Reaction Noted  . Other  02/15/2018    Review of Systems:    All  systems reviewed and negative except where noted in HPI.   Physical Exam:  BP 126/75   Pulse 91   Temp 97.8 F (36.6 C) (Temporal)   Ht 5' (1.524 m)   Wt 146 lb 12.8 oz (66.6 kg)   BMI 28.67 kg/m  No LMP recorded. Patient has had a hysterectomy. General:   Alert,  Well-developed, well-nourished, pleasant and cooperative in NAD Head:  Normocephalic and atraumatic. Eyes:  Sclera clear, no icterus.   Conjunctiva pink.  Ears:  Normal auditory acuity. Neck:  Supple; no masses or thyromegaly. Lungs:  Respirations even and unlabored.  Clear throughout to auscultation.   No wheezes, crackles, or rhonchi. No acute distress. Heart:  Regular rate and rhythm; no murmurs, clicks, rubs, or gallops. Abdomen:  Normal bowel sounds.  No bruits.  Soft, non-tender and non-distended without masses, hepatosplenomegaly or hernias noted.  No guarding or rebound tenderness.  Negative Carnett sign.   Rectal:  Deferred.  Pulses:  Normal pulses noted. Extremities:  No clubbing or edema.  No cyanosis. Neurologic:  Alert and oriented x3;  grossly normal neurologically. Skin:  Intact without significant lesions or rashes.  No jaundice. Lymph Nodes:  No significant cervical adenopathy. Psych:  Alert and cooperative. Normal mood and affect.  Imaging Studies: No results found.  Assessment and Plan:   Michelle Moore is a 65 y.o. y/o very pleasant female who comes in today with a history of constipation.  The patient states she is doing well from that point of view when she takes strawberries which helps with her constipation. The patient has a family history of colon cancer In her brother before the age of 21 and she will be set up for a colonoscopy.  The patient has been explained the plan and agrees with it.    Lucilla Lame, MD. [place] Regal    Note: This dictation was prepared with Dragon dictation along with smaller phrase technology. Any transcriptional errors that result from this process are unintentional.

## 2021-01-27 ENCOUNTER — Encounter: Payer: Self-pay | Admitting: Gastroenterology

## 2021-02-03 ENCOUNTER — Other Ambulatory Visit: Payer: Self-pay | Admitting: Nurse Practitioner

## 2021-02-03 DIAGNOSIS — E876 Hypokalemia: Secondary | ICD-10-CM

## 2021-02-04 ENCOUNTER — Other Ambulatory Visit: Payer: Self-pay

## 2021-02-04 ENCOUNTER — Ambulatory Visit
Admission: RE | Admit: 2021-02-04 | Discharge: 2021-02-04 | Disposition: A | Discharge status: Home / Self Care | Payer: Medicare HMO | Source: Ambulatory Visit | Attending: Oncology | Admitting: Oncology

## 2021-02-04 DIAGNOSIS — R922 Inconclusive mammogram: Secondary | ICD-10-CM | POA: Diagnosis not present

## 2021-02-04 DIAGNOSIS — D0511 Intraductal carcinoma in situ of right breast: Secondary | ICD-10-CM | POA: Insufficient documentation

## 2021-02-04 DIAGNOSIS — E876 Hypokalemia: Secondary | ICD-10-CM

## 2021-02-04 HISTORY — DX: Malignant neoplasm of unspecified site of unspecified female breast: C50.919

## 2021-02-04 MED ORDER — POTASSIUM CHLORIDE CRYS ER 10 MEQ PO TBCR: 10.0000 meq | EXTENDED_RELEASE_TABLET | Freq: Every day | ORAL | 1 refills | Status: DC

## 2021-02-10 NOTE — Discharge Instructions (Signed)

## 2021-02-11 ENCOUNTER — Other Ambulatory Visit: Payer: Self-pay

## 2021-02-11 ENCOUNTER — Ambulatory Visit: Payer: Medicare HMO | Admitting: Anesthesiology

## 2021-02-11 ENCOUNTER — Encounter
Admission: RE | Disposition: A | Discharge status: Home / Self Care | Payer: Self-pay | Source: Home / Self Care | Attending: Gastroenterology

## 2021-02-11 ENCOUNTER — Encounter: Payer: Self-pay | Admitting: Gastroenterology

## 2021-02-11 ENCOUNTER — Ambulatory Visit
Admission: RE | Admit: 2021-02-11 | Discharge: 2021-02-11 | Disposition: A | Discharge status: Home / Self Care | Payer: Medicare HMO | Attending: Gastroenterology | Admitting: Gastroenterology

## 2021-02-11 DIAGNOSIS — J449 Chronic obstructive pulmonary disease, unspecified: Secondary | ICD-10-CM | POA: Diagnosis not present

## 2021-02-11 DIAGNOSIS — K635 Polyp of colon: Secondary | ICD-10-CM | POA: Diagnosis not present

## 2021-02-11 DIAGNOSIS — Z803 Family history of malignant neoplasm of breast: Secondary | ICD-10-CM | POA: Insufficient documentation

## 2021-02-11 DIAGNOSIS — Z800 Family history of malignant neoplasm of digestive organs: Secondary | ICD-10-CM

## 2021-02-11 DIAGNOSIS — Z801 Family history of malignant neoplasm of trachea, bronchus and lung: Secondary | ICD-10-CM | POA: Diagnosis not present

## 2021-02-11 DIAGNOSIS — Z9011 Acquired absence of right breast and nipple: Secondary | ICD-10-CM | POA: Diagnosis not present

## 2021-02-11 DIAGNOSIS — Z87891 Personal history of nicotine dependence: Secondary | ICD-10-CM | POA: Insufficient documentation

## 2021-02-11 DIAGNOSIS — D124 Benign neoplasm of descending colon: Secondary | ICD-10-CM | POA: Diagnosis not present

## 2021-02-11 DIAGNOSIS — Z981 Arthrodesis status: Secondary | ICD-10-CM | POA: Insufficient documentation

## 2021-02-11 DIAGNOSIS — Z8669 Personal history of other diseases of the nervous system and sense organs: Secondary | ICD-10-CM | POA: Diagnosis not present

## 2021-02-11 DIAGNOSIS — I2510 Atherosclerotic heart disease of native coronary artery without angina pectoris: Secondary | ICD-10-CM | POA: Insufficient documentation

## 2021-02-11 DIAGNOSIS — K5730 Diverticulosis of large intestine without perforation or abscess without bleeding: Secondary | ICD-10-CM | POA: Insufficient documentation

## 2021-02-11 DIAGNOSIS — G4730 Sleep apnea, unspecified: Secondary | ICD-10-CM | POA: Diagnosis not present

## 2021-02-11 DIAGNOSIS — K648 Other hemorrhoids: Secondary | ICD-10-CM | POA: Diagnosis not present

## 2021-02-11 DIAGNOSIS — D125 Benign neoplasm of sigmoid colon: Secondary | ICD-10-CM | POA: Insufficient documentation

## 2021-02-11 DIAGNOSIS — Z8371 Family history of colonic polyps: Secondary | ICD-10-CM | POA: Diagnosis not present

## 2021-02-11 DIAGNOSIS — Z853 Personal history of malignant neoplasm of breast: Secondary | ICD-10-CM | POA: Insufficient documentation

## 2021-02-11 DIAGNOSIS — I252 Old myocardial infarction: Secondary | ICD-10-CM | POA: Diagnosis not present

## 2021-02-11 DIAGNOSIS — Z923 Personal history of irradiation: Secondary | ICD-10-CM | POA: Insufficient documentation

## 2021-02-11 DIAGNOSIS — Z955 Presence of coronary angioplasty implant and graft: Secondary | ICD-10-CM | POA: Insufficient documentation

## 2021-02-11 DIAGNOSIS — Z90710 Acquired absence of both cervix and uterus: Secondary | ICD-10-CM | POA: Insufficient documentation

## 2021-02-11 DIAGNOSIS — Z1211 Encounter for screening for malignant neoplasm of colon: Secondary | ICD-10-CM | POA: Diagnosis not present

## 2021-02-11 DIAGNOSIS — D122 Benign neoplasm of ascending colon: Secondary | ICD-10-CM | POA: Diagnosis not present

## 2021-02-11 DIAGNOSIS — Z7951 Long term (current) use of inhaled steroids: Secondary | ICD-10-CM | POA: Diagnosis not present

## 2021-02-11 DIAGNOSIS — Z79899 Other long term (current) drug therapy: Secondary | ICD-10-CM | POA: Diagnosis not present

## 2021-02-11 DIAGNOSIS — Z8249 Family history of ischemic heart disease and other diseases of the circulatory system: Secondary | ICD-10-CM | POA: Insufficient documentation

## 2021-02-11 HISTORY — PX: COLONOSCOPY WITH PROPOFOL: SHX5780

## 2021-02-11 HISTORY — PX: POLYPECTOMY: SHX5525

## 2021-02-11 SURGERY — COLONOSCOPY WITH PROPOFOL
Anesthesia: General | Site: Rectum

## 2021-02-11 MED ORDER — STERILE WATER FOR IRRIGATION IR SOLN
Status: DC | PRN
  Administered 2021-02-11: 150 mL

## 2021-02-11 MED ORDER — LACTATED RINGERS IV SOLN: INTRAVENOUS | Status: DC

## 2021-02-11 MED ORDER — SODIUM CHLORIDE 0.9 % IV SOLN: INTRAVENOUS | Status: DC

## 2021-02-11 MED ORDER — LIDOCAINE HCL (CARDIAC) PF 100 MG/5ML IV SOSY
PREFILLED_SYRINGE | INTRAVENOUS | Status: DC | PRN
  Administered 2021-02-11: 30 mg via INTRAVENOUS

## 2021-02-11 MED ORDER — PROPOFOL 10 MG/ML IV BOLUS
INTRAVENOUS | Status: DC | PRN
  Administered 2021-02-11: 80 mg via INTRAVENOUS
  Administered 2021-02-11: 20 mg via INTRAVENOUS
  Administered 2021-02-11: 30 mg via INTRAVENOUS
  Administered 2021-02-11 (×2): 20 mg via INTRAVENOUS
  Administered 2021-02-11: 30 mg via INTRAVENOUS
  Administered 2021-02-11: 20 mg via INTRAVENOUS
  Administered 2021-02-11: 30 mg via INTRAVENOUS
  Administered 2021-02-11: 20 mg via INTRAVENOUS

## 2021-02-11 SURGICAL SUPPLY — 22 items
CLIP HMST 235XBRD CATH ROT (MISCELLANEOUS) IMPLANT
CLIP RESOLUTION 360 11X235 (MISCELLANEOUS)
ELECT REM PT RETURN 9FT ADLT (ELECTROSURGICAL)
ELECTRODE REM PT RTRN 9FT ADLT (ELECTROSURGICAL) IMPLANT
FORCEPS BIOP RAD 4 LRG CAP 4 (CUTTING FORCEPS) IMPLANT
GOWN CVR UNV OPN BCK APRN NK (MISCELLANEOUS) ×4 IMPLANT
GOWN ISOL THUMB LOOP REG UNIV (MISCELLANEOUS) ×6
INJECTOR VARIJECT VIN23 (MISCELLANEOUS) IMPLANT
KIT DEFENDO VALVE AND CONN (KITS) IMPLANT
KIT PRC NS LF DISP ENDO (KITS) ×2 IMPLANT
KIT PROCEDURE OLYMPUS (KITS) ×3
MANIFOLD NEPTUNE II (INSTRUMENTS) ×3 IMPLANT
MARKER SPOT ENDO TATTOO 5ML (MISCELLANEOUS) IMPLANT
PROBE APC STR FIRE (PROBE) IMPLANT
RETRIEVER NET ROTH 2.5X230 LF (MISCELLANEOUS) IMPLANT
SNARE COLD EXACTO (MISCELLANEOUS) ×3 IMPLANT
SNARE SHORT THROW 13M SML OVAL (MISCELLANEOUS) IMPLANT
SNARE SNG USE RND 15MM (INSTRUMENTS) IMPLANT
SPOT EX ENDOSCOPIC TATTOO (MISCELLANEOUS)
TRAP ETRAP POLY (MISCELLANEOUS) ×3 IMPLANT
VARIJECT INJECTOR VIN23 (MISCELLANEOUS)
WATER STERILE IRR 250ML POUR (IV SOLUTION) ×3 IMPLANT

## 2021-02-11 NOTE — Op Note (Signed)
[facility] Gastroenterology Patient Name: Michelle Moore Procedure Date: 02/11/2021 8:58 AM MRN: [id number] Account #: [id number] Date of Birth: 01/10/1956 Admit Type: Outpatient Age: 65 Room: [facility] OR ROOM 01 Gender: Female Note Status: Finalized Procedure:             Colonoscopy Indications:           Screening for colorectal malignant neoplasm, Colon                         cancer screening in patient at increased risk: Family                         history of 1st-degree relative with colon polyps                         before age 72 years Providers:             Lucilla Lame MD, MD Referring MD:          Lavera Guise, MD (Referring MD) Medicines:             Propofol per Anesthesia Complications:         No immediate complications. Procedure:             Pre-Anesthesia Assessment:                        - Prior to the procedure, a History and Physical was                         performed, and patient medications and allergies were                         reviewed. The patient's tolerance of previous                         anesthesia was also reviewed. The risks and benefits                         of the procedure and the sedation options and risks                         were discussed with the patient. All questions were                         answered, and informed consent was obtained. Prior                         Anticoagulants: The patient has taken no previous                         anticoagulant or antiplatelet agents. ASA Grade                         Assessment: II - A patient with mild systemic disease.                         After reviewing the risks and benefits, the patient  was deemed in satisfactory condition to undergo the                         procedure.                        After obtaining informed consent, the colonoscope was                         passed under direct vision. Throughout the procedure,                          the patient's blood pressure, pulse, and oxygen                         saturations were monitored continuously. The                         Colonoscope was introduced through the anus and                         advanced to the the cecum, identified by appendiceal                         orifice and ileocecal valve. The colonoscopy was                         performed without difficulty. The patient tolerated                         the procedure well. The quality of the bowel                         preparation was excellent. Findings:      The perianal and digital rectal examinations were normal.      Three sessile polyps were found in the ascending colon. The polyps were       3 to 6 mm in size. These polyps were removed with a cold snare.       Resection and retrieval were complete.      Three sessile polyps were found in the descending colon. The polyps were       3 to 7 mm in size. These polyps were removed with a cold snare.       Resection and retrieval were complete.      A 3 mm polyp was found in the sigmoid colon. The polyp was sessile. The       polyp was removed with a cold snare. Resection and retrieval were       complete.      Non-bleeding internal hemorrhoids were found during retroflexion. The       hemorrhoids were Grade I (internal hemorrhoids that do not prolapse).      Multiple small-mouthed diverticula were found in the sigmoid colon. Impression:            - Three 3 to 6 mm polyps in the ascending colon,                         removed with a cold snare. Resected and retrieved.                        -  Three 3 to 7 mm polyps in the descending colon,                         removed with a cold snare. Resected and retrieved.                        - One 3 mm polyp in the sigmoid colon, removed with a                         cold snare. Resected and retrieved.                        - Non-bleeding internal hemorrhoids.                        -  Diverticulosis in the sigmoid colon. Recommendation:        - Discharge patient to home.                        - Resume previous diet.                        - Continue present medications.                        - Await pathology results.                        - Repeat colonoscopy in 3 years for surveillance if                         adenomatous and 5 years if hyperplastic. Procedure Code(s):     --- Professional ---                        [phone], Colonoscopy, flexible; with removal of                         tumor(s), polyp(s), or other lesion(s) by snare                         technique Diagnosis Code(s):     --- Professional ---                        Z12.11, Encounter for screening for malignant neoplasm                         of colon                        Z83.71, Family history of colonic polyps                        K63.5, Polyp of colon CPT copyright 2019 [employer]. All rights reserved. The codes documented in this report are preliminary and upon coder review may  be revised to meet current compliance requirements. Lucilla Lame MD, MD 02/11/2021 9:28:10 AM This report has been signed electronically. Number of Addenda: 0 Note Initiated On: 02/11/2021 8:58 AM Scope Withdrawal Time: 0 hours 10 minutes 21 seconds  Total Procedure Duration:  0 hours 17 minutes 14 seconds  Estimated Blood Loss:  Estimated blood loss: none.      [facility]

## 2021-02-11 NOTE — Anesthesia Postprocedure Evaluation (Signed)
Anesthesia Post Note  Patient: Michelle Moore  Procedure(s) Performed: COLONOSCOPY WITH PROPOFOL (N/A ) POLYPECTOMY (N/A Rectum)     Patient location during evaluation: PACU Anesthesia Type: General Level of consciousness: awake and alert and oriented Pain management: satisfactory to patient Vital Signs Assessment: post-procedure vital signs reviewed and stable Respiratory status: spontaneous breathing, nonlabored ventilation and respiratory function stable Cardiovascular status: blood pressure returned to baseline and stable Postop Assessment: Adequate PO intake and No signs of nausea or vomiting Anesthetic complications: no   No complications documented.  Raliegh Ip

## 2021-02-11 NOTE — Anesthesia Preprocedure Evaluation (Signed)
Anesthesia Evaluation  Patient identified by MRN, date of birth, ID band Patient awake    Reviewed: Allergy & Precautions, H&P , NPO status , Patient's Chart, lab work & pertinent test results  Airway Mallampati: II  TM Distance: >3 FB Neck ROM: full    Dental no notable dental hx.    Pulmonary sleep apnea , COPD,  COPD inhaler, former smoker,    Pulmonary exam normal breath sounds clear to auscultation       Cardiovascular hypertension, + CAD, + Past MI, + Cardiac Stents and + Peripheral Vascular Disease  Normal cardiovascular exam Rhythm:regular Rate:Normal  Carotid stenosis   Neuro/Psych PSYCHIATRIC DISORDERS  Neuromuscular disease    GI/Hepatic GERD  ,  Endo/Other    Renal/GU      Musculoskeletal   Abdominal   Peds  Hematology   Anesthesia Other Findings   Reproductive/Obstetrics                             Anesthesia Physical Anesthesia Plan  ASA: III  Anesthesia Plan: General   Post-op Pain Management:    Induction: Intravenous  PONV Risk Score and Plan: 3 and Treatment may vary due to age or medical condition, Propofol infusion and TIVA  Airway Management Planned: Natural Airway  Additional Equipment:   Intra-op Plan:   Post-operative Plan:   Informed Consent: I have reviewed the patients History and Physical, chart, labs and discussed the procedure including the risks, benefits and alternatives for the proposed anesthesia with the patient or authorized representative who has indicated his/her understanding and acceptance.     Dental Advisory Given  Plan Discussed with: CRNA  Anesthesia Plan Comments:         Anesthesia Quick Evaluation

## 2021-02-11 NOTE — Anesthesia Procedure Notes (Signed)
Procedure Name: MAC Date/Time: 02/11/2021 9:03 AM Performed by: Vanetta Shawl, CRNA Pre-anesthesia Checklist: Patient identified, Emergency Drugs available, Suction available, Timeout performed and Patient being monitored Patient Re-evaluated:Patient Re-evaluated prior to induction Oxygen Delivery Method: Nasal cannula Placement Confirmation: positive ETCO2

## 2021-02-11 NOTE — Transfer of Care (Signed)
Immediate Anesthesia Transfer of Care Note  Patient: Michelle Moore  Procedure(s) Performed: COLONOSCOPY WITH PROPOFOL (N/A ) POLYPECTOMY (N/A Rectum)  Patient Location: PACU  Anesthesia Type: General  Level of Consciousness: awake, alert  and patient cooperative  Airway and Oxygen Therapy: Patient Spontanous Breathing   Post-op Assessment: Post-op Vital signs reviewed, Patient's Cardiovascular Status Stable, Respiratory Function Stable, Patent Airway and No signs of Nausea or vomiting  Post-op Vital Signs: Reviewed and stable  Complications: No complications documented.

## 2021-02-11 NOTE — H&P (Signed)
Lucilla Lame, MD [address] [place], [place] [address] Phone: [phone] Fax : [phone]  Primary Care Physician:  Lavera Guise, MD Primary Gastroenterologist:  Dr. Allen Norris  Pre-Procedure History & Physical: HPI:  Michelle Moore is a 65 y.o. female is here for a screening colonoscopy.   Past Medical History:  Diagnosis Date  . Anxiety   . Arthritis   . Breast cancer ([place])   . Cancer ([place])    breast-right   . COPD (chronic obstructive pulmonary disease) ([place])   . Coronary artery disease   . Dyspnea    ONLY WITH EXERTION  . GERD (gastroesophageal reflux disease)    "sometimes"  . Headache(784.0)    sinus headaches  . Myocardial infarction ([place])    in 2011  no damage-1 stent  . Personal history of radiation therapy   . Seizures ([place]) 2014   X1-no idea what caused her seizure  . Sleep apnea    USES CPAP    Past Surgical History:  Procedure Laterality Date  . ABDOMINAL HYSTERECTOMY    . ANTERIOR CERVICAL DECOMP/DISCECTOMY FUSION N/A 01/20/2014   Procedure: ANTERIOR CERVICAL DECOMPRESSION/DISCECTOMY FUSION 2 LEVELS cervical five/six six [place]ry Kos;  Surgeon: Ophelia Charter, MD;  Location: [facility];  Service: Neurosurgery;  Laterality: N/A;  . BACK SURGERY     lumbar  . 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 BREAST BIOPSY  01/16/2019   coil, BENIGN MAMMARY TISSUE WITH MODERATE STROMAL FIBROSIS AND COARSE DYSTROPHIC CALCIFICATIONS of the RIGHT breast  . BREAST LUMPECTOMY Right 03/07/2018   Procedure: BREAST LUMPECTOMY/ RE EXCISION;  Surgeon: Herbert Pun, MD;  Location: [facility] ORS;  Service: General;  Laterality: Right;  . CARDIAC CATHETERIZATION     2011  . CORONARY ANGIOPLASTY WITH STENT PLACEMENT     PLACED IN RCA 2011  . EYE SURGERY     lasik  . PARTIAL MASTECTOMY WITH NEEDLE LOCALIZATION Right 02/21/2018   Procedure: PARTIAL MASTECTOMY WITH NEEDLE LOCALIZATION;  Surgeon: Herbert Pun, MD;  Location: [facility] ORS;   Service: General;  Laterality: Right;  . ROTATOR CUFF REPAIR     left shoulder  . SHOULDER ARTHROSCOPY WITH ROTATOR CUFF REPAIR AND OPEN BICEPS TENODESIS Right 08/07/2019   Procedure: SHOULDER ARTHROSCOPY WITH DEBRIDEMENT, DECOMPRESSION, ROTATOR CUFF REPAIR AND BICEPS TENODESIS. - RNFA;  Surgeon: Corky Mull, MD;  Location: [facility] ORS;  Service: Orthopedics;  Laterality: Right;  . TOE SURGERY Right    has pin and plate in it    Prior to Admission medications   Medication Sig Start Date End Date Taking? Authorizing Provider  ALPRAZolam (XANAX) 0.25 MG tablet Take 1 tablet (0.25 mg total) by mouth 2 (two) times daily as needed for anxiety. 12/22/20  Yes Luiz Ochoa, NP  anastrozole (ARIMIDEX) 1 MG tablet Take 1 tablet (1 mg total) by mouth daily. 01/14/21  Yes Lloyd Huger, MD  cetirizine (ZYRTEC) 10 MG tablet Take 10 mg by mouth daily as needed for allergies.    Yes [provider]  cyclobenzaprine (FLEXERIL) 10 MG tablet Take 1 tablet (10 mg total) by mouth 3 (three) times daily as needed for muscle spasms. 04/09/20  Yes Viona Gilmore D, NP  escitalopram (LEXAPRO) 20 MG tablet TAKE (1) TABLET BY MOUTH EVERY DAY 01/14/21  Yes Lavera Guise, MD  fluticasone [facility]) 50 MCG/ACT nasal spray Place 2 sprays into both nostrils daily as needed for allergies. 09/09/19  Yes Ronnell Freshwater,  NP  Fluticasone-Salmeterol (ADVAIR) 250-50 MCG/DOSE AEPB Inhale 1 puff into the lungs 2 (two) times daily. 10/29/20  Yes Lavera Guise, MD  folic acid (FOLVITE) 622 MCG tablet Take 1,600 mcg by mouth daily.   Yes [provider]  methotrexate (RHEUMATREX) 2.5 MG tablet Take 20 mg by mouth every Wednesday.  10/02/17  Yes [provider]  montelukast (SINGULAIR) 10 MG tablet Take 1 tablet (10 mg total) by mouth at bedtime. 01/14/21  Yes Lavera Guise, MD  Na Sulfate-K Sulfate-Mg Sulf (SUPREP BOWEL PREP KIT) 17.5-3.13-1.6 GM/177ML SOLN Take 1 kit by mouth as directed. 01/21/21  Yes Lucilla Lame, MD  Olopatadine HCl 0.2 % SOLN Place 1 drop into both eyes daily as needed (allergies).   Yes [provider]  Omeprazole 20 MG TBEC Take 20 mg by mouth every morning.    Yes [provider]  potassium chloride (KLOR-CON) 10 MEQ tablet Take 1 tablet (10 mEq total) by mouth daily. 02/04/21  Yes Lavera Guise, MD  simvastatin (ZOCOR) 40 MG tablet Take 1 tablet (40 mg total) by mouth at bedtime. 06/02/20  Yes Ronnell Freshwater, NP    Allergies as of 01/21/2021 - Review Complete 01/21/2021  Allergen Reaction Noted  . Other  02/15/2018    Family History  Problem Relation Age of Onset  . Breast cancer Cousin 58       bilateral at 35 and 25; daughter of maternal aunt who was unaffected  . Lung cancer Maternal Aunt        dx 68s; deceased 60s; smoker  . Heart attack Father        deceased 76  . Stomach cancer Maternal Grandmother     Social History   Socioeconomic History  . Marital status: Married    Spouse name: Not on file  . Number of children: Not on file  . Years of education: Not on file  . Highest education level: Not on file  Occupational History  . Not on file  Tobacco Use  . Smoking status: Former Smoker    Packs/day: 1.00    Years: 30.00    Pack years: 30.00    Types: Cigarettes    Quit date: 09/13/2015    Years since quitting: 5.4  . Smokeless tobacco: Never Used  Vaping Use  . Vaping Use: Never used  Substance and Sexual Activity  . Alcohol use: Yes    Comment: occasional  . Drug use: No  . Sexual activity: Not on file  Other Topics Concern  . Not on file  Social History Narrative  . Not on file   Social Determinants of Health   Financial Resource Strain: Not on file  Food Insecurity: Not on file  Transportation Needs: Not on file  Physical Activity: Not on file  Stress: Not on file  Social Connections: Not on file  Intimate Partner Violence: Not on file    Review of Systems: See HPI, otherwise negative ROS  Physical  Exam: BP (!) 103/57   Pulse 81   Temp (!) 96.2 F (35.7 C) (Tympanic)   Resp 20   Ht 5' (1.524 m)   Wt 63.8 kg   SpO2 93%   BMI 27.48 kg/m  General:   Alert,  pleasant and cooperative in NAD Head:  Normocephalic and atraumatic. Neck:  Supple; no masses or thyromegaly. Lungs:  Clear throughout to auscultation.    Heart:  Regular rate and rhythm. Abdomen:  Soft, nontender and nondistended. Normal  bowel sounds, without guarding, and without rebound.   Neurologic:  Alert and  oriented x4;  grossly normal neurologically.  Impression/Plan: TYTEANNA OST is now here to undergo a screening colonoscopy.  Risks, benefits, and alternatives regarding colonoscopy have been reviewed with the patient.  Questions have been answered.  All parties agreeable.

## 2021-02-15 LAB — SURGICAL PATHOLOGY

## 2021-02-16 ENCOUNTER — Encounter: Payer: Self-pay | Admitting: Gastroenterology

## 2021-02-22 ENCOUNTER — Inpatient Hospital Stay: Payer: Medicare HMO | Attending: Oncology | Admitting: Nurse Practitioner

## 2021-02-22 DIAGNOSIS — Z79811 Long term (current) use of aromatase inhibitors: Secondary | ICD-10-CM

## 2021-02-22 DIAGNOSIS — M8580 Other specified disorders of bone density and structure, unspecified site: Secondary | ICD-10-CM

## 2021-02-22 DIAGNOSIS — D0511 Intraductal carcinoma in situ of right breast: Secondary | ICD-10-CM

## 2021-02-22 NOTE — Progress Notes (Signed)
Michelle Moore  [phone] Fax:(336) [phone]   ID: Michelle Moore OB: 11-17-1955  MR#: [id number]  URK#:[id number]  Patient Care Team: Lavera Guise, MD as PCP - General (Internal Medicine)  I connected with Michelle Moore on 02/22/21 at  2:00 PM EDT by video enabled telemedicine visit and verified that I am speaking with the correct person using two identifiers.   I discussed the limitations, risks, security and privacy concerns of performing an evaluation and management service by telemedicine and the availability of in-person appointments. I also discussed with the patient that there may be a patient responsible charge related to this service. The patient expressed understanding and agreed to proceed.   Other persons participating in the visit and their role in the encounter: Patient, MD.  Patient's location: Home. Provider's location: Clinic.  CHIEF COMPLAINT: DCIS, right breast.  INTERVAL HISTORY: Patient agreed to video assisted telemedicine visit for routine 27-monthevaluation.  She does not complain of joint pain today.  She continues to tolerate anastrozole well without significant side effects.  She has no neurologic complaints.  She denies any recent fevers or illnesses.  She has a good appetite and denies weight loss.  She denies any chest pain, shortness of breath, cough, or hemoptysis.  She denies any nausea, vomiting, constipation, or diarrhea. She has no urinary complaints.  Patient offers no specific complaints today.  REVIEW OF SYSTEMS:   Review of Systems  Constitutional: Negative.  Negative for fever, malaise/fatigue and weight loss.  Respiratory: Negative.  Negative for cough, hemoptysis and shortness of breath.   Cardiovascular: Negative.  Negative for chest pain and leg swelling.  Gastrointestinal: Negative.  Negative for abdominal pain.  Genitourinary: Negative.  Negative for dysuria.  Musculoskeletal: Negative.  Negative for back pain  and myalgias.  Skin: Negative.  Negative for rash.  Neurological: Negative.  Negative for dizziness, sensory change, focal weakness and weakness.  Psychiatric/Behavioral: Negative.  The patient is not nervous/anxious.   As per HPI. Otherwise, a complete review of systems is negative.  PAST MEDICAL HISTORY: Past Medical History:  Diagnosis Date   Anxiety    Arthritis    [place]    [place]    breast-right    [place]    Coronary artery disease    Dyspnea    ONLY WITH EXERTION   GERD (gastroesophageal reflux disease)    "sometimes"   Headache(784.0)    sinus headaches   [place]    in 2011  no damage-1 stent   Personal history of radiation therapy    [place]   X1-no idea what caused her seizure   Sleep apnea    USES CPAP    PAST SURGICAL HISTORY: Past Surgical History:  Procedure Laterality Date   ABDOMINAL HYSTERECTOMY     ANTERIOR CERVICAL DECOMP/DISCECTOMY FUSION N/A 01/20/2014   Procedure: ANTERIOR CERVICAL DECOMPRESSION/DISCECTOMY FUSION 2 LEVELS cervical five/six six /[place] Kos  Surgeon: JOphelia Charter MD;  [facility];  Service: Neurosurgery;  Laterality: N/A;   BACK SURGERY     lumbar   BREAST BIOPSY Right 02/01/2018   x shape, DCIS    BREAST BIOPSY  01/16/2019   coil, BENIGN MAMMARY TISSUE WITH MODERATE STROMAL FIBROSIS AND COARSE DYSTROPHIC CALCIFICATIONS of the RIGHT breast   BREAST LUMPECTOMY Right 03/07/2018   Procedure: BREAST LUMPECTOMY/ RE EXCISION;  Surgeon: CHerbert Pun MD;  Location: [facility];  Service: General;  Laterality:  Right;   CARDIAC CATHETERIZATION     2011   COLONOSCOPY WITH PROPOFOL N/A 02/11/2021   Procedure: COLONOSCOPY WITH PROPOFOL;  Surgeon: Lucilla Lame, MD;  Location: [place];  Service: Endoscopy;  Laterality: N/A;  Requests Early   CORONARY ANGIOPLASTY WITH STENT PLACEMENT     PLACED IN RCA 2011   EYE SURGERY      lasik   PARTIAL MASTECTOMY WITH NEEDLE LOCALIZATION Right 02/21/2018   Procedure: PARTIAL MASTECTOMY WITH NEEDLE LOCALIZATION;  Surgeon: Herbert Pun, MD;  Location: [facility];  Service: General;  Laterality: Right;   POLYPECTOMY N/A 02/11/2021   Procedure: POLYPECTOMY;  Surgeon: Lucilla Lame, MD;  Location: [place];  Service: Endoscopy;  Laterality: N/A;   ROTATOR CUFF REPAIR     left shoulder   SHOULDER ARTHROSCOPY WITH ROTATOR CUFF REPAIR AND OPEN BICEPS TENODESIS Right 08/07/2019   Procedure: SHOULDER ARTHROSCOPY WITH DEBRIDEMENT, DECOMPRESSION, ROTATOR CUFF REPAIR AND BICEPS TENODESIS. - RNFA;  Surgeon: Corky Mull, MD;  Location: [facility];  Service: Orthopedics;  Laterality: Right;   TOE SURGERY Right    has pin and plate in it    FAMILY HISTORY: Family History  Problem Relation Age of Onset   Breast cancer Cousin 21       bilateral at 30 and 70; daughter of maternal aunt who was unaffected   Lung cancer Maternal Aunt        dx 9s; deceased 66s; smoker   Heart attack Father        deceased 79   Stomach cancer Maternal Grandmother     ADVANCED DIRECTIVES (Y/N):  N  HEALTH MAINTENANCE: Social History   Tobacco Use   Smoking status: Former    Packs/day: 1.00    Years: 30.00    Pack years: 30.00    Types: Cigarettes    Quit date: 09/13/2015    Years since quitting: 5.4   Smokeless tobacco: Never  Vaping Use   Vaping Use: Never used  Substance Use Topics   Alcohol use: Yes    Comment: occasional   Drug use: No    Colonoscopy:  PAP:  Bone density:  Lipid panel:  Allergies  Allergen Reactions   Other     BANDAIDS-OF LEFT ON FOR AN EXTENDED PERIOD OF TIME    Current Outpatient Medications  Medication Sig Dispense Refill   ALPRAZolam (XANAX) 0.25 MG tablet Take 1 tablet (0.25 mg total) by mouth 2 (two) times daily as needed for anxiety. 45 tablet 1   anastrozole (ARIMIDEX) 1 MG tablet Take 1 tablet (1 mg total) by mouth daily. 90 tablet 0    cetirizine (ZYRTEC) 10 MG tablet Take 10 mg by mouth daily as needed for allergies.      cyclobenzaprine (FLEXERIL) 10 MG tablet Take 1 tablet (10 mg total) by mouth 3 (three) times daily as needed for muscle spasms. 30 tablet 0   escitalopram (LEXAPRO) 20 MG tablet TAKE (1) TABLET BY MOUTH EVERY DAY 30 tablet 3   fluticasone (FLONASE) 50 MCG/ACT nasal spray Place 2 sprays into both nostrils daily as needed for allergies. 60 mL 1   Fluticasone-Salmeterol (ADVAIR) 250-50 MCG/DOSE AEPB Inhale 1 puff into the lungs 2 (two) times daily. 60 each 3   folic acid (FOLVITE) 973 MCG tablet Take 1,600 mcg by mouth daily.     methotrexate (RHEUMATREX) 2.5 MG tablet Take 20 mg by mouth every Wednesday.      montelukast (SINGULAIR) 10 MG tablet Take 1 tablet (10  mg total) by mouth at bedtime. 90 tablet 1   Na Sulfate-K Sulfate-Mg Sulf (SUPREP BOWEL PREP KIT) 17.5-3.13-1.6 GM/177ML SOLN Take 1 kit by mouth as directed. 354 mL 0   Olopatadine HCl 0.2 % SOLN Place 1 drop into both eyes daily as needed (allergies).     Omeprazole 20 MG TBEC Take 20 mg by mouth every morning.      potassium chloride (KLOR-CON) 10 MEQ tablet Take 1 tablet (10 mEq total) by mouth daily. 90 tablet 1   simvastatin (ZOCOR) 40 MG tablet Take 1 tablet (40 mg total) by mouth at bedtime. 90 tablet 3   No current facility-administered medications for this visit.    OBJECTIVE: There were no vitals filed for this visit.   There is no height or weight on file to calculate BMI.    ECOG FS:0 - Asymptomatic  General: Well-developed, well-nourished, no acute distress. HEENT: Normocephalic. Neuro: Alert, answering all questions appropriately. Cranial nerves grossly intact. Psych: Normal affect.  LAB RESULTS:  Lab Results  Component Value Date   NA 138 04/09/2020   K 4.5 04/09/2020   CL 102 04/09/2020   CO2 27 04/09/2020   GLUCOSE 149 (H) 04/09/2020   BUN 12 04/09/2020   CREATININE 0.82 04/09/2020   CALCIUM 8.5 (L) 04/09/2020   PROT  6.7 03/20/2020   ALBUMIN 4.3 03/20/2020   AST 21 03/20/2020   ALT 10 03/20/2020   ALKPHOS 112 03/20/2020   BILITOT 0.2 03/20/2020   GFRNONAA >60 04/09/2020   GFRAA >60 04/09/2020    Lab Results  Component Value Date   WBC 14.9 (H) 04/09/2020   NEUTROABS 13.6 (H) 02/14/2018   HGB 11.8 (L) 04/09/2020   HCT 37.7 04/09/2020   MCV 98.2 04/09/2020   PLT 185 04/09/2020     STUDIES: MM DIAG BREAST TOMO BILATERAL  Result Date: 02/04/2021 CLINICAL DATA:  Patient presents for bilateral diagnostic examination due to history of previous right malignant lumpectomy 2019. EXAM: DIGITAL DIAGNOSTIC BILATERAL MAMMOGRAM WITH TOMOSYNTHESIS AND CAD TECHNIQUE: Bilateral digital diagnostic mammography and breast tomosynthesis was performed. The images were evaluated with computer-aided detection. COMPARISON:  Previous exam(s). ACR Breast Density Category c: The breast tissue is heterogeneously dense, which may obscure small masses. FINDINGS: Examination demonstrates stable post lumpectomy changes over the upper-outer quadrant of the right breast. Remainder of the right breast as well as the left breast is unchanged. IMPRESSION: Stable post lumpectomy changes of the right breast. RECOMMENDATION: Recommend continued annual bilateral screening mammographic follow-up. I have discussed the findings and recommendations with the patient. If applicable, a reminder letter will be sent to the patient regarding the next appointment. BI-RADS CATEGORY  1: Negative. Electronically Signed   By: Marin Olp M.D.   On: 02/04/2021 10:36    ASSESSMENT: DCIS, right breast.  PLAN:    1. DCIS, right breast: Patient underwent lumpectomy followed by adjuvant XRT completing in September 2019.  Because there was no invasive component on her pathology, she did not require adjuvant chemotherapy. Patient could not tolerate tamoxifen or letrozole secondary to worsening joint pain, therefore was switched to anastrozole. Has been  tolerating anastrozole well without significant side effects. She will complete 5 years of treatment in September 2024. Mammogram 02/04/21 birads 1: negative. Plan to repeat in May 2023. Return to clinic in 6 months for video assisted telemedicine visit for continued surveillance.   2.  Osteopenia: Patient's most recent bone mineral density on March 03, 2020 reported a T score of -1.9 which is  slightly worse than previous in February 2020 where the T score was reported -1.4.  Has been taking calcium but not vitamin d. She is switching to [place] which may provide more complete supplementation. Weight bearing exercise as tolerated.   Plan:  Bone density exam this month Video visit with MD/NP in 6 months  I provided 20 minutes of face-to-face video visit time during this encounter which included chart review, counseling, and coordination of care as documented above.  Verlon Au, NP 02/22/2021 4:38 PM  Cancer Staging Ductal carcinoma in situ (DCIS) of right breast Staging form: Breast, AJCC 8th Edition - Clinical: Stage 0 (cTis (DCIS), cN0, cM0, ER+, PR-, HER2-) - Signed by Lloyd Huger, MD on 03/18/2018 Nuclear grade: G3 Laterality: Right

## 2021-03-01 ENCOUNTER — Encounter: Payer: Self-pay | Admitting: Internal Medicine

## 2021-03-01 ENCOUNTER — Ambulatory Visit (INDEPENDENT_AMBULATORY_CARE_PROVIDER_SITE_OTHER): Payer: Medicare HMO | Admitting: Internal Medicine

## 2021-03-01 ENCOUNTER — Other Ambulatory Visit: Payer: Self-pay

## 2021-03-01 ENCOUNTER — Telehealth: Payer: Self-pay

## 2021-03-01 VITALS — BP 132/84 | HR 81 | Temp 98.3°F | Resp 16 | Ht 60.0 in | Wt 144.2 lb

## 2021-03-01 DIAGNOSIS — F411 Generalized anxiety disorder: Secondary | ICD-10-CM | POA: Diagnosis not present

## 2021-03-01 DIAGNOSIS — L237 Allergic contact dermatitis due to plants, except food: Secondary | ICD-10-CM

## 2021-03-01 MED ORDER — ALPRAZOLAM 0.25 MG PO TABS: 0.2500 mg | ORAL_TABLET | Freq: Two times a day (BID) | ORAL | 1 refills | Status: DC | PRN

## 2021-03-01 MED ORDER — PREDNISONE 10 MG PO TABS: ORAL_TABLET | ORAL | 0 refills | Status: DC

## 2021-03-01 MED ORDER — METHYLPREDNISOLONE ACETATE 80 MG/ML IJ SUSP
80.0000 mg | Freq: Once | INTRAMUSCULAR | Status: AC
  Administered 2021-03-01: 80 mg via INTRAMUSCULAR

## 2021-03-01 MED ORDER — METHYLPREDNISOLONE ACETATE 80 MG/ML IJ SUSP: 80.0000 mg | Freq: Once | INTRAMUSCULAR | Status: DC

## 2021-03-01 MED ORDER — ESCITALOPRAM OXALATE 20 MG PO TABS: ORAL_TABLET | ORAL | 3 refills | Status: DC

## 2021-03-01 NOTE — Telephone Encounter (Signed)
Please make an app, she can come now

## 2021-03-01 NOTE — Progress Notes (Signed)
[facility] [place], [place] [address]  Internal MEDICINE  Office Visit Note  Patient Name: Michelle Moore  [id number]  [id number]  Date of Service: 03/01/2021  Chief Complaint  Patient presents with   Acute Visit    poison ivy     HPI  Patient is here with complaints of rash. She was working in her garden and was trying to pull out some weeds. She started noticing rash on her hands which then spread to her other body parts including abdomen and face she is also having itching the rash is Blistering and [place] pattern Her other problem is worsening anxiety and has been having periods of feeling panicky at times she has been taking care of her dementia and it has been very stressful for her she is also not sleeping as good at night she is on Celexa 20 mg once a day  Current Medication: Outpatient Encounter Medications as of 03/01/2021  Medication Sig   anastrozole (ARIMIDEX) 1 MG tablet Take 1 tablet (1 mg total) by mouth daily.   cetirizine (ZYRTEC) 10 MG tablet Take 10 mg by mouth daily as needed for allergies.    fluticasone (FLONASE) 50 MCG/ACT nasal spray Place 2 sprays into both nostrils daily as needed for allergies.   Fluticasone-Salmeterol (ADVAIR) 250-50 MCG/DOSE AEPB Inhale 1 puff into the lungs 2 (two) times daily.   folic acid (FOLVITE) 748 MCG tablet Take 1,600 mcg by mouth daily.   methotrexate (RHEUMATREX) 2.5 MG tablet Take 20 mg by mouth every Wednesday.    montelukast (SINGULAIR) 10 MG tablet Take 1 tablet (10 mg total) by mouth at bedtime.   Olopatadine HCl 0.2 % SOLN Place 1 drop into both eyes daily as needed (allergies).   Omeprazole 20 MG TBEC Take 20 mg by mouth every morning.    potassium chloride (KLOR-CON) 10 MEQ tablet Take 1 tablet (10 mEq total) by mouth daily.   predniSONE (DELTASONE) 10 MG tablet Take one tab 3 x day for 3 days, then take one tab 2 x a day for 3 days and then take one tab a day for 3 days for copd    simvastatin (ZOCOR) 40 MG tablet Take 1 tablet (40 mg total) by mouth at bedtime.   [DISCONTINUED] ALPRAZolam (XANAX) 0.25 MG tablet Take 1 tablet (0.25 mg total) by mouth 2 (two) times daily as needed for anxiety.   [DISCONTINUED] escitalopram (LEXAPRO) 20 MG tablet TAKE (1) TABLET BY MOUTH EVERY DAY   ALPRAZolam (XANAX) 0.25 MG tablet Take 1 tablet (0.25 mg total) by mouth 2 (two) times daily as needed for anxiety.   escitalopram (LEXAPRO) 20 MG tablet Take half tab in am and one tab at night for depression and anxiety   [DISCONTINUED] cyclobenzaprine (FLEXERIL) 10 MG tablet Take 1 tablet (10 mg total) by mouth 3 (three) times daily as needed for muscle spasms. (Patient not taking: Reported on 03/01/2021)   [DISCONTINUED] Na Sulfate-K Sulfate-Mg Sulf (SUPREP BOWEL PREP KIT) 17.5-3.13-1.6 GM/177ML SOLN Take 1 kit by mouth as directed. (Patient not taking: Reported on 03/01/2021)   Facility-Administered Encounter Medications as of 03/01/2021  Medication   methylPREDNISolone acetate (DEPO-MEDROL) injection 80 mg    Surgical History: Past Surgical History:  Procedure Laterality Date   ABDOMINAL HYSTERECTOMY     ANTERIOR CERVICAL DECOMP/DISCECTOMY FUSION N/A 01/20/2014   Procedure: ANTERIOR CERVICAL DECOMPRESSION/DISCECTOMY FUSION 2 LEVELS cervical five/six six [place];  Surgeon: Ophelia Charter, MD;  [facility]: [facility];  Service:  Neurosurgery;  Laterality: N/A;   BACK SURGERY     lumbar   BREAST BIOPSY Right 02/01/2018   x shape, DCIS    BREAST BIOPSY  01/16/2019   coil, BENIGN MAMMARY TISSUE WITH MODERATE STROMAL FIBROSIS AND COARSE DYSTROPHIC CALCIFICATIONS of the RIGHT breast   BREAST LUMPECTOMY Right 03/07/2018   Procedure: BREAST LUMPECTOMY/ RE EXCISION;  Surgeon: Herbert Pun, MD;  Location: [facility];  Service: General;  Laterality: Right;   CARDIAC CATHETERIZATION     2011   COLONOSCOPY WITH PROPOFOL N/A 02/11/2021   Procedure: COLONOSCOPY WITH PROPOFOL;  Surgeon: Lucilla Lame, MD;  Location: [place];  Service: Endoscopy;  Laterality: N/A;  Requests Early   CORONARY ANGIOPLASTY WITH STENT PLACEMENT     PLACED IN RCA 2011   EYE SURGERY     lasik   PARTIAL MASTECTOMY WITH NEEDLE LOCALIZATION Right 02/21/2018   Procedure: PARTIAL MASTECTOMY WITH NEEDLE LOCALIZATION;  Surgeon: Herbert Pun, MD;  Location: [facility];  Service: General;  Laterality: Right;   POLYPECTOMY N/A 02/11/2021   Procedure: POLYPECTOMY;  Surgeon: Lucilla Lame, MD;  Location: [place];  Service: Endoscopy;  Laterality: N/A;   ROTATOR CUFF REPAIR     left shoulder   SHOULDER ARTHROSCOPY WITH ROTATOR CUFF REPAIR AND OPEN BICEPS TENODESIS Right 08/07/2019   Procedure: SHOULDER ARTHROSCOPY WITH DEBRIDEMENT, DECOMPRESSION, ROTATOR CUFF REPAIR AND BICEPS TENODESIS. - RNFA;  Surgeon: Corky Mull, MD;  Location: [facility];  Service: Orthopedics;  Laterality: Right;   TOE SURGERY Right    has pin and plate in it    Medical History: Past Medical History:  Diagnosis Date   Anxiety    Arthritis    Breast cancer ([place])    Cancer ([place])    breast-right    COPD (chronic obstructive pulmonary disease) ([place])    Coronary artery disease    Dyspnea    ONLY WITH EXERTION   GERD (gastroesophageal reflux disease)    "sometimes"   Headache(784.0)    sinus headaches   Myocardial infarction ([place])    in 2011  no damage-1 stent   Personal history of radiation therapy    Seizures ([place]) 2014   X1-no idea what caused her seizure   Sleep apnea    USES CPAP    Family History: Family History  Problem Relation Age of Onset   Breast cancer Cousin 60       bilateral at 63 and 11; daughter of maternal aunt who was unaffected   Lung cancer Maternal Aunt        dx 17s; deceased 72s; smoker   Heart attack Father        deceased 67   Stomach cancer Maternal Grandmother     Social History   Socioeconomic History   Marital status: Married    Spouse name: Not on file    Number of children: Not on file   Years of education: Not on file   Highest education level: Not on file  Occupational History   Not on file  Tobacco Use   Smoking status: Former    Packs/day: 1.00    Years: 30.00    Pack years: 30.00    Types: Cigarettes    Quit date: 09/13/2015    Years since quitting: 5.4   Smokeless tobacco: Never  Vaping Use   Vaping Use: Never used  Substance and Sexual Activity   Alcohol use: Yes    Comment: occasional   Drug use: No  Sexual activity: Not on file  Other Topics Concern   Not on file  Social History Narrative   Not on file   Social Determinants of Health   Financial Resource Strain: Not on file  Food Insecurity: Not on file  Transportation Needs: Not on file  Physical Activity: Not on file  Stress: Not on file  Social Connections: Not on file  Intimate Partner Violence: Not on file      Review of Systems  Constitutional:  Negative for fatigue and fever.  HENT:  Negative for congestion, mouth sores and postnasal drip.   Respiratory:  Negative for cough.   Cardiovascular:  Negative for chest pain.  Genitourinary:  Negative for flank pain.  Skin:  Positive for rash.  Psychiatric/Behavioral: Negative.     Vital Signs: BP 132/84   Pulse 81   Temp 98.3 F (36.8 C)   Resp 16   Ht 5' (1.524 m)   Wt 144 lb 3.2 oz (65.4 kg)   SpO2 93%   BMI 28.16 kg/m    Physical Exam Constitutional:      Appearance: Normal appearance.  HENT:     Head: Normocephalic and atraumatic.     Nose: Nose normal.     Mouth/Throat:     Mouth: Mucous membranes are moist.     Pharynx: No posterior oropharyngeal erythema.  Eyes:     Extraocular Movements: Extraocular movements intact.     Pupils: Pupils are equal, round, and reactive to light.  Pulmonary:     Effort: Pulmonary effort is normal.     Breath sounds: Normal breath sounds.  Skin:    Findings: Erythema, lesion and rash present.  Neurological:     Mental Status: She is alert.        Assessment/Plan: 1. Poison ivy dermatitis Patient has more than 2 mg involvement start treatment as prescribed today. She was instructed to take Benadryl 50 mg at bedtime and Claritin during the day she will also finish her prednisone as prescribed today - predniSONE (DELTASONE) 10 MG tablet; Take one tab 3 x day for 3 days, then take one tab 2 x a day for 3 days and then take one tab a day for 3 days for copd (Patient not taking: Reported on 03/01/2021)  Dispense: 18 tablet; Refill: 0 - methylPREDNISolone acetate (DEPO-MEDROL) injection 80 mg  2. Generalized anxiety disorder Worsening generalized anxiety disorder we will increase her Lexapro to 30 mg once a day.  Continue alprazolam as needed twice a day and see controlled substance policy - ALPRAZolam (XANAX) 0.25 MG tablet; Take 1 tablet (0.25 mg total) by mouth 2 (two) times daily as needed for anxiety.  Dispense: 60 tablet; Refill: 1 - escitalopram (LEXAPRO) 20 MG tablet; Take half tab in am and one tab at night for depression and anxiety  Dispense: 45 tablet; Refill: 3   General Counseling: [place] verbalizes understanding of the findings of todays visit and agrees with plan of treatment. I have discussed any further diagnostic evaluation that may be needed or ordered today. We also reviewed her medications today. she has been encouraged to call the office with any questions or concerns that should arise related to todays visit.    No orders of the defined types were placed in this encounter.   Meds ordered this encounter  Medications   methylPREDNISolone acetate (DEPO-MEDROL) injection 80 mg   predniSONE (DELTASONE) 10 MG tablet    Sig: Take one tab 3 x day for 3 days, then  take one tab 2 x a day for 3 days and then take one tab a day for 3 days for copd    Dispense:  18 tablet    Refill:  0   ALPRAZolam (XANAX) 0.25 MG tablet    Sig: Take 1 tablet (0.25 mg total) by mouth 2 (two) times daily as needed for anxiety.     Dispense:  60 tablet    Refill:  1   escitalopram (LEXAPRO) 20 MG tablet    Sig: Take half tab in am and one tab at night for depression and anxiety    Dispense:  45 tablet    Refill:  3    Total time spent:30 Minutes Time spent includes review of chart, medications, test results, and follow up plan with the patient.   [place] Controlled Substance Database was reviewed by me.   Dr Lavera Guise Internal medicine

## 2021-03-04 ENCOUNTER — Other Ambulatory Visit: Payer: Medicare HMO

## 2021-03-23 DIAGNOSIS — Z981 Arthrodesis status: Secondary | ICD-10-CM | POA: Diagnosis not present

## 2021-03-23 DIAGNOSIS — I10 Essential (primary) hypertension: Secondary | ICD-10-CM | POA: Diagnosis not present

## 2021-03-23 DIAGNOSIS — M4180 Other forms of scoliosis, site unspecified: Secondary | ICD-10-CM | POA: Diagnosis not present

## 2021-03-23 DIAGNOSIS — Z6828 Body mass index (BMI) 28.0-28.9, adult: Secondary | ICD-10-CM | POA: Diagnosis not present

## 2021-03-25 ENCOUNTER — Ambulatory Visit
Admission: RE | Admit: 2021-03-25 | Discharge: 2021-03-25 | Disposition: A | Discharge status: Home / Self Care | Payer: Medicare HMO | Source: Ambulatory Visit | Attending: Nurse Practitioner | Admitting: Nurse Practitioner

## 2021-03-25 ENCOUNTER — Other Ambulatory Visit: Payer: Self-pay

## 2021-03-25 DIAGNOSIS — Z79811 Long term (current) use of aromatase inhibitors: Secondary | ICD-10-CM | POA: Diagnosis not present

## 2021-03-25 DIAGNOSIS — M8580 Other specified disorders of bone density and structure, unspecified site: Secondary | ICD-10-CM | POA: Diagnosis not present

## 2021-03-25 DIAGNOSIS — M8589 Other specified disorders of bone density and structure, multiple sites: Secondary | ICD-10-CM | POA: Diagnosis not present

## 2021-03-25 DIAGNOSIS — Z780 Asymptomatic menopausal state: Secondary | ICD-10-CM | POA: Diagnosis not present

## 2021-04-02 LAB — HM MAMMOGRAPHY: HM Mammogram: NORMAL (ref 0–4)

## 2021-04-06 ENCOUNTER — Encounter: Payer: Self-pay | Admitting: Nurse Practitioner

## 2021-04-06 ENCOUNTER — Inpatient Hospital Stay: Payer: Medicare HMO | Attending: Nurse Practitioner | Admitting: Nurse Practitioner

## 2021-04-06 ENCOUNTER — Other Ambulatory Visit: Payer: Self-pay

## 2021-04-06 DIAGNOSIS — C50919 Malignant neoplasm of unspecified site of unspecified female breast: Secondary | ICD-10-CM | POA: Diagnosis not present

## 2021-04-06 DIAGNOSIS — Z79811 Long term (current) use of aromatase inhibitors: Secondary | ICD-10-CM

## 2021-04-06 DIAGNOSIS — M810 Age-related osteoporosis without current pathological fracture: Secondary | ICD-10-CM | POA: Diagnosis not present

## 2021-04-06 DIAGNOSIS — Z9889 Other specified postprocedural states: Secondary | ICD-10-CM | POA: Insufficient documentation

## 2021-04-06 MED ORDER — ANASTROZOLE 1 MG PO TABS: 1.0000 mg | ORAL_TABLET | Freq: Every day | ORAL | 0 refills | Status: DC

## 2021-04-06 MED ORDER — ALENDRONATE SODIUM 70 MG PO TABS: 70.0000 mg | ORAL_TABLET | ORAL | 1 refills | Status: DC

## 2021-04-06 NOTE — Progress Notes (Signed)
Patient called/ pre- screened for virtual appoinment today with oncologist. No concerns at this time

## 2021-04-06 NOTE — Progress Notes (Signed)
[place]  [phone] [phone]   ID: Michelle Moore OB: 12/24/1955  MR#: [id number]  [id number]#:[id number]  Patient Care Team: Lavera Guise, MD as PCP - General (Internal Medicine)  I connected with Michelle Moore on 04/06/21 at  9:30 AM EDT by video enabled telemedicine visit and verified that I am speaking with the correct person using two identifiers.   I discussed the limitations, risks, security and privacy concerns of performing an evaluation and management service by telemedicine and the availability of in-person appointments. I also discussed with the patient that there may be a patient responsible charge related to this service. The patient expressed understanding and agreed to proceed.   Other persons participating in the visit and their role in the encounter: Patient, NP  Patient's location: Home Provider's location: Clinic  CHIEF COMPLAINT: DCIS, right breast & osteoporosis  INTERVAL HISTORY: Patient agreed to video assisted telemedicine visit for discussion of bone density results. She continues to feel well. Continues anastrozole. No nausea, vomiting, history of GI stricture or barretts esophagus.   REVIEW OF SYSTEMS:   Review of Systems  Constitutional: Negative.  Negative for fever, malaise/fatigue and weight loss.  Respiratory: Negative.  Negative for cough, hemoptysis and shortness of breath.   Cardiovascular: Negative.  Negative for chest pain and leg swelling.  Gastrointestinal: Negative.  Negative for abdominal pain.  Genitourinary: Negative.  Negative for dysuria.  Musculoskeletal: Negative.  Negative for back pain and myalgias.  Skin: Negative.  Negative for rash.  Neurological: Negative.  Negative for dizziness, sensory change, focal weakness and weakness.  Psychiatric/Behavioral: Negative.  The patient is not nervous/anxious.   As per HPI. Otherwise, a complete review of systems is negative.  PAST MEDICAL HISTORY: Past  Medical History:  Diagnosis Date   Anxiety    Arthritis    Breast cancer ([place])    Cancer ([place])    breast-right    COPD (chronic obstructive pulmonary disease) ()    Coronary artery disease    Dyspnea    ONLY WITH EXERTION   GERD (gastroesophageal reflux disease)    "sometimes"   Headache(784.0)    sinus headaches   Myocardial infarction ([place])    in 2011  no damage-1 stent   Personal history of radiation therapy    Seizures ([place]) 2014   X1-no idea what caused her seizure   Sleep apnea    USES CPAP    PAST SURGICAL HISTORY: Past Surgical History:  Procedure Laterality Date   ABDOMINAL HYSTERECTOMY     ANTERIOR CERVICAL DECOMP/DISCECTOMY FUSION N/A 01/20/2014   Procedure: ANTERIOR CERVICAL DECOMPRESSION/DISCECTOMY FUSION 2 LEVELS cervical five/six six [place];  Surgeon: Ophelia Charter, MD;  Location: [facility];  Service: Neurosurgery;  Laterality: N/A;   BACK SURGERY     lumbar   BREAST BIOPSY Right 02/01/2018   x shape, DCIS    BREAST BIOPSY  01/16/2019   coil, BENIGN MAMMARY TISSUE WITH MODERATE STROMAL FIBROSIS AND COARSE DYSTROPHIC CALCIFICATIONS of the RIGHT breast   BREAST LUMPECTOMY Right 03/07/2018   Procedure: BREAST LUMPECTOMY/ RE EXCISION;  Surgeon: Herbert Pun, MD;  Location: [facility];  Service: General;  Laterality: Right;   CARDIAC CATHETERIZATION     2011   COLONOSCOPY WITH PROPOFOL N/A 02/11/2021   Procedure: COLONOSCOPY WITH PROPOFOL;  Surgeon: Lucilla Lame, MD;  Location: [facility];  Service: Endoscopy;  Laterality: N/A;  Requests Early   CORONARY ANGIOPLASTY WITH STENT PLACEMENT     PLACED IN  RCA 2011   EYE SURGERY     lasik   PARTIAL MASTECTOMY WITH NEEDLE LOCALIZATION Right 02/21/2018   Procedure: PARTIAL MASTECTOMY WITH NEEDLE LOCALIZATION;  Surgeon: Herbert Pun, MD;  Location: [facility];  Service: General;  Laterality: Right;   POLYPECTOMY N/A 02/11/2021   Procedure: POLYPECTOMY;  Surgeon: Lucilla Lame, MD;  Location:  [facility];  Service: Endoscopy;  Laterality: N/A;   ROTATOR CUFF REPAIR     left shoulder   SHOULDER ARTHROSCOPY WITH ROTATOR CUFF REPAIR AND OPEN BICEPS TENODESIS Right 08/07/2019   Procedure: SHOULDER ARTHROSCOPY WITH DEBRIDEMENT, DECOMPRESSION, ROTATOR CUFF REPAIR AND BICEPS TENODESIS. - RNFA;  Surgeon: Corky Mull, MD;  Location: [facility];  Service: Orthopedics;  Laterality: Right;   TOE SURGERY Right    has pin and plate in it    FAMILY HISTORY: Family History  Problem Relation Age of Onset   Breast cancer Cousin 55       bilateral at 53 and 60; daughter of maternal aunt who was unaffected   Lung cancer Maternal Aunt        dx 46s; deceased 5s; smoker   Heart attack Father        deceased 54   Stomach cancer Maternal Grandmother     ADVANCED DIRECTIVES (Y/N):  N  HEALTH MAINTENANCE: Social History   Tobacco Use   Smoking status: Former    Packs/day: 1.00    Years: 30.00    Pack years: 30.00    Types: Cigarettes    Quit date: 09/13/2015    Years since quitting: 5.5   Smokeless tobacco: Never  Vaping Use   Vaping Use: Never used  Substance Use Topics   Alcohol use: Yes    Comment: occasional   Drug use: No    Colonoscopy:  PAP:  Bone density:  Lipid panel:  Allergies  Allergen Reactions   Other     BANDAIDS-OF LEFT ON FOR AN EXTENDED PERIOD OF TIME    Current Outpatient Medications  Medication Sig Dispense Refill   ALPRAZolam (XANAX) 0.25 MG tablet Take 1 tablet (0.25 mg total) by mouth 2 (two) times daily as needed for anxiety. 60 tablet 1   cetirizine (ZYRTEC) 10 MG tablet Take 10 mg by mouth daily as needed for allergies.      escitalopram (LEXAPRO) 20 MG tablet Take half tab in am and one tab at night for depression and anxiety 45 tablet 3   fluticasone (FLONASE) 50 MCG/ACT nasal spray Place 2 sprays into both nostrils daily as needed for allergies. 60 mL 1   folic acid (FOLVITE) 537 MCG tablet Take 1,600 mcg by mouth daily.      methotrexate (RHEUMATREX) 2.5 MG tablet Take 20 mg by mouth every Wednesday.      montelukast (SINGULAIR) 10 MG tablet Take 1 tablet (10 mg total) by mouth at bedtime. 90 tablet 1   Olopatadine HCl 0.2 % SOLN Place 1 drop into both eyes daily as needed (allergies).     Omeprazole 20 MG TBEC Take 20 mg by mouth every morning.      potassium chloride (KLOR-CON) 10 MEQ tablet Take 1 tablet (10 mEq total) by mouth daily. 90 tablet 1   simvastatin (ZOCOR) 40 MG tablet Take 1 tablet (40 mg total) by mouth at bedtime. 90 tablet 3   anastrozole (ARIMIDEX) 1 MG tablet Take 1 tablet (1 mg total) by mouth daily. 90 tablet 0   Fluticasone-Salmeterol (ADVAIR) 250-50 MCG/DOSE AEPB Inhale 1 puff into  the lungs 2 (two) times daily. (Patient not taking: Reported on 04/06/2021) 60 each 3   No current facility-administered medications for this visit.    OBJECTIVE: Exam limited d/t telemedicine  There were no vitals filed for this visit.   There is no height or weight on file to calculate BMI.    ECOG FS:0 - Asymptomatic  General: Well-developed, well-nourished, no acute distress. HEENT: Normocephalic. Neuro: Alert, answering all questions appropriately. Cranial nerves grossly intact. Psych: Normal affect.  LAB RESULTS:  Lab Results  Component Value Date   NA 138 04/09/2020   K 4.5 04/09/2020   CL 102 04/09/2020   CO2 27 04/09/2020   GLUCOSE 149 (H) 04/09/2020   BUN 12 04/09/2020   CREATININE 0.82 04/09/2020   CALCIUM 8.5 (L) 04/09/2020   PROT 6.7 03/20/2020   ALBUMIN 4.3 03/20/2020   AST 21 03/20/2020   ALT 10 03/20/2020   ALKPHOS 112 03/20/2020   BILITOT 0.2 03/20/2020   GFRNONAA >60 04/09/2020   GFRAA >60 04/09/2020    Lab Results  Component Value Date   WBC 14.9 (H) 04/09/2020   NEUTROABS 13.6 (H) 02/14/2018   HGB 11.8 (L) 04/09/2020   HCT 37.7 04/09/2020   MCV 98.2 04/09/2020   PLT 185 04/09/2020     STUDIES: DG Bone Density  Result Date: 03/26/2021 EXAM: DUAL X-RAY  ABSORPTIOMETRY (DXA) FOR BONE MINERAL DENSITY IMPRESSION: Dear Dr. Zenia Resides, Your patient Michelle Moore completed a FRAX assessment on 03/25/2021 using the [place] (analysis version: 14.10) manufactured by [employer]. The following summarizes the results of our evaluation. PATIENT BIOGRAPHICAL: Name: Michelle Moore, Michelle Moore Patient ID: [id number] Birth Date: 1956-02-28 Height:    60.0 in. Gender:     [age]    [age]       Weight:    143.7 lbs. Ethnicity:  White                            Exam Date: 03/25/2021 FRAX* RESULTS:  (version: 3.5) 10-year Probability of Fracture1 Major Osteoporotic Fracture2 Hip Fracture 9.6% 1.2% Population: [place] (Caucasian) Risk Factors: None Based on Femur (Right) Neck BMD 1 -The 10-year probability of fracture may be lower than reported if the patient has received treatment. 2 -Major Osteoporotic Fracture: Clinical Spine, Forearm, Hip or Shoulder *FRAX is a Materials engineer of the [employer] for Metabolic Bone Disease, a [place] (WHO) [facility]. ASSESSMENT: The probability of a major osteoporotic fracture is 9.6% within the next ten years. The probability of a hip fracture is 1.2% within the next ten years. . Your patient Michelle Moore completed a BMD test on 03/25/2021 using the [place] (software version: 14.10) manufactured by [employer]. The following summarizes the results of our evaluation. Technologist: [facility] PATIENT BIOGRAPHICAL: Name: Michelle Moore, Michelle Moore Patient ID: [id number] Birth Date: 01/08/1956 Height: 60.0 in. Gender: Female Exam Date: 03/25/2021 Weight: 143.7 lbs. Indications: Caucasian, Postmenopausal, High Risk Meds, Hysterectomy, History of Radiation, COPD, History of Breast Cancer Fractures: Treatments: Anastrozole, Calcium, Methotrexate, Omeprazole, Singulair, Tramadol, Vitamin D DENSITOMETRY RESULTS: Site         Region     Measured Date Measured Age WHO Classification Young  Adult T-score BMD         %Change vs. Previous Significant Change (*) DualFemur Neck Right 03/25/2021 64.4 Osteopenia -1.8 0.793 g/cm2 -4.8% - DualFemur Neck  Right 03/03/2020 63.3 Osteopenia -1.5 0.833 g/cm2 -1.7% - DualFemur Neck Right 11/01/2018 62.0 Osteopenia -1.4 0.847 g/cm2 - - DualFemur Total Mean 03/25/2021 64.4 Osteopenia -1.3 0.840 g/cm2 -1.2% - DualFemur Total Mean 03/03/2020 63.3 Osteopenia -1.2 0.850 g/cm2 -3.0% Yes DualFemur Total Mean 11/01/2018 62.0 Normal -1.0 0.876 g/cm2 - - Left Forearm Radius 33% 03/25/2021 64.4 Osteopenia -2.3 0.674 g/cm2 -4.8% - Left Forearm Radius 33% 03/03/2020 63.3 Osteopenia -1.9 0.708 g/cm2 -1.3% - Left Forearm Radius 33% 11/01/2018 62.0 Osteopenia -1.8 0.717 g/cm2 - - ASSESSMENT: The BMD measured at Forearm Radius 33% is 0.674 g/cm2 with a T-score of -2.3. This patient is considered osteopenic according to [facility]) criteria. The scan quality is good. Lumbar spine was not utilized due to advanced degenerative changes. Compared with prior study, there has been no significant change in the total hip. [facility] Pharmacologist [facility]) criteria for post-menopausal, Caucasian Women: Normal:                   T-score at or above -1 SD Osteopenia/low bone mass: T-score between -1 and -2.5 SD Osteoporosis:             T-score at or below -2.5 SD RECOMMENDATIONS: 1. All patients should optimize calcium and vitamin D intake. 2. Consider FDA-approved medical therapies in postmenopausal women and men aged 42 years and older, based on the following: a. A hip or vertebral(clinical or morphometric) fracture b. T-score < -2.5 at the femoral neck or spine after appropriate evaluation to exclude secondary causes c. Low bone mass (T-score between -1.0 and -2.5 at the femoral neck or spine) and a 10-year probability of a hip fracture > 3% or a 10-year probability of a major osteoporosis-related fracture > 20% based on the [place]-adapted WHO algorithm 3. Clinician judgment  and/or patient preferences may indicate treatment for people with 10-year fracture probabilities above or below these levels FOLLOW-UP: People with diagnosed cases of osteoporosis or at high risk for fracture should have regular bone mineral density tests. For patients eligible for Medicare, routine testing is allowed once every 2 years. The testing frequency can be increased to one year for patients who have rapidly progressing disease, those who are receiving or discontinuing medical therapy to restore bone mass, or have additional risk factors. I have reviewed this report, and agree with the above findings. [facility] Electronically Signed   By: Rolm Baptise M.D.   On: 03/26/2021 11:19    ASSESSMENT: DCIS, right breast.  1. DCIS, right breast: Patient underwent lumpectomy followed by adjuvant XRT completing in September 2019.  Because there was no invasive component on her pathology, she did not require adjuvant chemotherapy. Patient could not tolerate tamoxifen or letrozole secondary to worsening joint pain, therefore was switched to anastrozole. Has been tolerating anastrozole well without significant side effects. She will complete 5 years of treatment in September 2024. Mammogram 02/04/21 birads 1: negative. Plan to repeat in May 2023. Follow up as scheduled.   2.  Osteoporosis: T score previously has worsened in past 2 years from -1.4 to -1.9, now -2.3 consistent with osteoporosis. AI likely contributing. Recommend initial treatment with oral bisphosphonate as initial therapy due to efficacy, favorable cost, and long term safety data. Start fosamax 70 mg once weekly. She does have a history of gerd and takes oral omeprazole daily. We discussed need to stay upright for at least 30 to 60 minutes after taking her medication. Creatnine last reported as 0.7, gfr 84. If unable to  tolerate oral bisphosphonate, recommend switching to IV zoledronic acid. Plan to repeat bone density scan in 1 year  to evaluate response.   Plan:  Follow up as scheduled or sooner if needed.   I provided 20 minutes of face-to-face video visit time during this encounter which included chart review, counseling, and coordination of care as documented above.  Verlon Au, NP 04/06/2021 9:11 AM  Cancer Staging Ductal carcinoma in situ (DCIS) of right breast Staging form: Breast, AJCC 8th Edition - Clinical: Stage 0 (cTis (DCIS), cN0, cM0, ER+, PR-, HER2-) - Signed by Lloyd Huger, MD on 03/18/2018 Nuclear grade: G3 Laterality: Right

## 2021-04-16 ENCOUNTER — Encounter: Payer: Self-pay | Admitting: Internal Medicine

## 2021-04-23 ENCOUNTER — Ambulatory Visit: Payer: Medicare HMO | Admitting: Physician Assistant

## 2021-04-29 ENCOUNTER — Ambulatory Visit: Payer: Medicare HMO | Admitting: Physician Assistant

## 2021-05-20 DIAGNOSIS — Z79899 Other long term (current) drug therapy: Secondary | ICD-10-CM | POA: Diagnosis not present

## 2021-05-20 DIAGNOSIS — M0579 Rheumatoid arthritis with rheumatoid factor of multiple sites without organ or systems involvement: Secondary | ICD-10-CM | POA: Diagnosis not present

## 2021-05-21 DIAGNOSIS — M48062 Spinal stenosis, lumbar region with neurogenic claudication: Secondary | ICD-10-CM | POA: Diagnosis not present

## 2021-05-21 DIAGNOSIS — M5416 Radiculopathy, lumbar region: Secondary | ICD-10-CM | POA: Diagnosis not present

## 2021-05-21 DIAGNOSIS — M5136 Other intervertebral disc degeneration, lumbar region: Secondary | ICD-10-CM | POA: Diagnosis not present

## 2021-05-25 ENCOUNTER — Other Ambulatory Visit: Payer: Self-pay

## 2021-05-25 DIAGNOSIS — F411 Generalized anxiety disorder: Secondary | ICD-10-CM

## 2021-05-25 DIAGNOSIS — J3089 Other allergic rhinitis: Secondary | ICD-10-CM

## 2021-05-25 MED ORDER — MONTELUKAST SODIUM 10 MG PO TABS: 10.0000 mg | ORAL_TABLET | Freq: Every day | ORAL | 1 refills | Status: DC

## 2021-05-25 MED ORDER — ESCITALOPRAM OXALATE 20 MG PO TABS: ORAL_TABLET | ORAL | 3 refills | Status: DC

## 2021-05-27 ENCOUNTER — Ambulatory Visit: Payer: Medicare HMO | Admitting: Physician Assistant

## 2021-06-03 ENCOUNTER — Ambulatory Visit (INDEPENDENT_AMBULATORY_CARE_PROVIDER_SITE_OTHER): Payer: Medicare HMO | Admitting: Physician Assistant

## 2021-06-03 ENCOUNTER — Encounter: Payer: Self-pay | Admitting: Physician Assistant

## 2021-06-03 ENCOUNTER — Other Ambulatory Visit: Payer: Self-pay

## 2021-06-03 VITALS — BP 140/80 | HR 80 | Temp 97.8°F | Resp 16 | Ht 61.0 in | Wt 143.0 lb

## 2021-06-03 DIAGNOSIS — E559 Vitamin D deficiency, unspecified: Secondary | ICD-10-CM | POA: Diagnosis not present

## 2021-06-03 DIAGNOSIS — R5383 Other fatigue: Secondary | ICD-10-CM

## 2021-06-03 DIAGNOSIS — M0579 Rheumatoid arthritis with rheumatoid factor of multiple sites without organ or systems involvement: Secondary | ICD-10-CM | POA: Diagnosis not present

## 2021-06-03 DIAGNOSIS — M65342 Trigger finger, left ring finger: Secondary | ICD-10-CM | POA: Diagnosis not present

## 2021-06-03 DIAGNOSIS — C50911 Malignant neoplasm of unspecified site of right female breast: Secondary | ICD-10-CM | POA: Diagnosis not present

## 2021-06-03 DIAGNOSIS — E782 Mixed hyperlipidemia: Secondary | ICD-10-CM | POA: Diagnosis not present

## 2021-06-03 DIAGNOSIS — F411 Generalized anxiety disorder: Secondary | ICD-10-CM

## 2021-06-03 DIAGNOSIS — E876 Hypokalemia: Secondary | ICD-10-CM

## 2021-06-03 DIAGNOSIS — Z23 Encounter for immunization: Secondary | ICD-10-CM | POA: Diagnosis not present

## 2021-06-03 DIAGNOSIS — E538 Deficiency of other specified B group vitamins: Secondary | ICD-10-CM

## 2021-06-03 DIAGNOSIS — J3089 Other allergic rhinitis: Secondary | ICD-10-CM | POA: Diagnosis not present

## 2021-06-03 DIAGNOSIS — Z170 Estrogen receptor positive status [ER+]: Secondary | ICD-10-CM | POA: Diagnosis not present

## 2021-06-03 DIAGNOSIS — M159 Polyosteoarthritis, unspecified: Secondary | ICD-10-CM | POA: Diagnosis not present

## 2021-06-03 MED ORDER — MONTELUKAST SODIUM 10 MG PO TABS
10.0000 mg | ORAL_TABLET | Freq: Every day | ORAL | 1 refills | Status: DC
Start: 2021-06-03 — End: 2021-07-15

## 2021-06-03 MED ORDER — SIMVASTATIN 40 MG PO TABS: 40.0000 mg | ORAL_TABLET | Freq: Every day | ORAL | 3 refills | Status: DC

## 2021-06-03 MED ORDER — BUPROPION HCL ER (XL) 150 MG PO TB24: 150.0000 mg | ORAL_TABLET | Freq: Every day | ORAL | 2 refills | Status: DC

## 2021-06-03 MED ORDER — POTASSIUM CHLORIDE CRYS ER 10 MEQ PO TBCR: 10.0000 meq | EXTENDED_RELEASE_TABLET | Freq: Every day | ORAL | 1 refills | Status: DC

## 2021-06-03 NOTE — Progress Notes (Signed)
[facility] [place], [place] [address]  Internal MEDICINE  Office Visit Note  Patient Name: Michelle Moore  [id number]  [id number]  Date of Service: 06/09/2021  Chief Complaint  Patient presents with   Follow-up   COPD   Anxiety    HPI Patient is here for routine follow-up -Sleeps well, but has been more fatigued lately -Has her mother  in her home currently. Mom has dementia and cannot be left alone.  She states that this has been very difficult for her and does not know that she can continue this long-term.  It has been very stressful on her and making her routine challenging however she refuses to place her mother in a home.  She is looking into hiring help such as a 24-hour aide to stay with her mother and to help lessen the stress and fatigue that it is placing on her currently -Taking xanax one at night usually, will occasionally take 1 during the day -She tried taking a half tab of Lexapro in the morning and full tab at night as instructed on last visit however states that she found herself more sleepy during the daytime even on just the half tab.  Patient stopped and is only taking 1 tablet at night but feels she needs something else to help control her mood during the daytime.  Discussed additional options that would not make her feel drowsy including Wellbutrin which she states she has been on many years ago and thinks she tolerated well -We will also order updated lab work to investigate other causes of her fatigue  Current Medication: Outpatient Encounter Medications as of 06/03/2021  Medication Sig   buPROPion (WELLBUTRIN XL) 150 MG 24 hr tablet Take 1 tablet (150 mg total) by mouth daily.   Fluticasone-Salmeterol (ADVAIR) 250-50 MCG/DOSE AEPB Inhale 1 puff into the lungs 2 (two) times daily.   alendronate (FOSAMAX) 70 MG tablet Take 1 tablet (70 mg total) by mouth once a week. Take with a full glass of water on an empty stomach. Stand or sit upright  for 1 hour after taking   ALPRAZolam (XANAX) 0.25 MG tablet Take 1 tablet (0.25 mg total) by mouth 2 (two) times daily as needed for anxiety.   anastrozole (ARIMIDEX) 1 MG tablet Take 1 tablet (1 mg total) by mouth daily.   cetirizine (ZYRTEC) 10 MG tablet Take 10 mg by mouth daily as needed for allergies.    escitalopram (LEXAPRO) 20 MG tablet Take half tab in am and one tab at night for depression and anxiety   fluticasone (FLONASE) 50 MCG/ACT nasal spray Place 2 sprays into both nostrils daily as needed for allergies.   folic acid (FOLVITE) 315 MCG tablet Take 1,600 mcg by mouth daily.   methotrexate (RHEUMATREX) 2.5 MG tablet Take 20 mg by mouth every Wednesday.    montelukast (SINGULAIR) 10 MG tablet Take 1 tablet (10 mg total) by mouth at bedtime.   Olopatadine HCl 0.2 % SOLN Place 1 drop into both eyes daily as needed (allergies).   Omeprazole 20 MG TBEC Take 20 mg by mouth every morning.    potassium chloride (KLOR-CON) 10 MEQ tablet Take 1 tablet (10 mEq total) by mouth daily.   simvastatin (ZOCOR) 40 MG tablet Take 1 tablet (40 mg total) by mouth at bedtime.   [DISCONTINUED] montelukast (SINGULAIR) 10 MG tablet Take 1 tablet (10 mg total) by mouth at bedtime.   [DISCONTINUED] potassium chloride (KLOR-CON) 10 MEQ tablet Take 1  tablet (10 mEq total) by mouth daily.   [DISCONTINUED] simvastatin (ZOCOR) 40 MG tablet Take 1 tablet (40 mg total) by mouth at bedtime.   No facility-administered encounter medications on file as of 06/03/2021.    Surgical History: Past Surgical History:  Procedure Laterality Date   ABDOMINAL HYSTERECTOMY     ANTERIOR CERVICAL DECOMP/DISCECTOMY FUSION N/A 01/20/2014   Procedure: ANTERIOR CERVICAL DECOMPRESSION/DISCECTOMY FUSION 2 LEVELS cervical five/six six [place];  Surgeon: Ophelia Charter, MD;  Location: [facility];  Service: Neurosurgery;  Laterality: N/A;   BACK SURGERY     lumbar   BREAST BIOPSY Right 02/01/2018   x shape, DCIS    BREAST BIOPSY   01/16/2019   coil, BENIGN MAMMARY TISSUE WITH MODERATE STROMAL FIBROSIS AND COARSE DYSTROPHIC CALCIFICATIONS of the RIGHT breast   BREAST LUMPECTOMY Right 03/07/2018   Procedure: BREAST LUMPECTOMY/ RE EXCISION;  Surgeon: Herbert Pun, MD;  Location: [facility];  Service: General;  Laterality: Right;   CARDIAC CATHETERIZATION     2011   COLONOSCOPY WITH PROPOFOL N/A 02/11/2021   Procedure: COLONOSCOPY WITH PROPOFOL;  Surgeon: Lucilla Lame, MD;  Location: [facility];  Service: Endoscopy;  Laterality: N/A;  Requests Early   CORONARY ANGIOPLASTY WITH STENT PLACEMENT     PLACED IN RCA 2011   EYE SURGERY     lasik   PARTIAL MASTECTOMY WITH NEEDLE LOCALIZATION Right 02/21/2018   Procedure: PARTIAL MASTECTOMY WITH NEEDLE LOCALIZATION;  Surgeon: Herbert Pun, MD;  Location: [facility];  Service: General;  Laterality: Right;   POLYPECTOMY N/A 02/11/2021   Procedure: POLYPECTOMY;  Surgeon: Lucilla Lame, MD;  Location: [facility];  Service: Endoscopy;  Laterality: N/A;   ROTATOR CUFF REPAIR     left shoulder   SHOULDER ARTHROSCOPY WITH ROTATOR CUFF REPAIR AND OPEN BICEPS TENODESIS Right 08/07/2019   Procedure: SHOULDER ARTHROSCOPY WITH DEBRIDEMENT, DECOMPRESSION, ROTATOR CUFF REPAIR AND BICEPS TENODESIS. - RNFA;  Surgeon: Corky Mull, MD;  Location: [facility];  Service: Orthopedics;  Laterality: Right;   TOE SURGERY Right    has pin and plate in it    Medical History: Past Medical History:  Diagnosis Date   Anxiety    Arthritis    Breast cancer ([place])    Cancer ([place])    breast-right    COPD (chronic obstructive pulmonary disease) ([place])    Coronary artery disease    Dyspnea    ONLY WITH EXERTION   GERD (gastroesophageal reflux disease)    "sometimes"   Headache(784.0)    sinus headaches   Myocardial infarction ([place])    in 2011  no damage-1 stent   Personal history of radiation therapy    Seizures ([place]) 2014   X1-no idea what caused her seizure   Sleep apnea     USES CPAP    Family History: Family History  Problem Relation Age of Onset   Breast cancer Cousin 56       bilateral at 72 and 28; daughter of maternal aunt who was unaffected   Lung cancer Maternal Aunt        dx 77s; deceased 64s; smoker   Heart attack Father        deceased 11   Stomach cancer Maternal Grandmother     Social History   Socioeconomic History   Marital status: Married    Spouse name: Not on file   Number of children: Not on file   Years of education: Not on file   Highest education level: Not  on file  Occupational History   Not on file  Tobacco Use   Smoking status: Former    Packs/day: 1.00    Years: 30.00    Pack years: 30.00    Types: Cigarettes    Quit date: 09/13/2015    Years since quitting: 5.7   Smokeless tobacco: Never  Vaping Use   Vaping Use: Never used  Substance and Sexual Activity   Alcohol use: Yes    Comment: occasional   Drug use: No   Sexual activity: Not on file  Other Topics Concern   Not on file  Social History Narrative   Not on file   Social Determinants of Health   Financial Resource Strain: Not on file  Food Insecurity: Not on file  Transportation Needs: Not on file  Physical Activity: Not on file  Stress: Not on file  Social Connections: Not on file  Intimate Partner Violence: Not on file      Review of Systems  Constitutional:  Positive for fatigue. Negative for chills and unexpected weight change.  HENT:  Negative for congestion, postnasal drip, rhinorrhea, sneezing and sore throat.   Eyes:  Negative for redness.  Respiratory:  Negative for cough, chest tightness and shortness of breath.   Cardiovascular:  Negative for chest pain and palpitations.  Gastrointestinal:  Negative for abdominal pain, constipation, diarrhea, nausea and vomiting.  Genitourinary:  Negative for dysuria and frequency.  Musculoskeletal:  Positive for arthralgias and back pain. Negative for joint swelling and neck pain.  Skin:   Negative for rash.  Allergic/Immunologic: Positive for environmental allergies.  Neurological: Negative.  Negative for tremors and numbness.  Hematological:  Negative for adenopathy. Does not bruise/bleed easily.  Psychiatric/Behavioral:  Positive for behavioral problems (Depression) and sleep disturbance. Negative for suicidal ideas. The patient is nervous/anxious.    Vital Signs: BP 140/80   Pulse 80   Temp 97.8 F (36.6 C)   Resp 16   Ht 5\' 1"  (1.549 m)   Wt 143 lb (64.9 kg)   SpO2 98%   BMI 27.02 kg/m    Physical Exam Vitals and nursing note reviewed.  Constitutional:      General: She is not in acute distress.    Appearance: She is well-developed. She is not diaphoretic.  HENT:     Head: Normocephalic and atraumatic.     Mouth/Throat:     Pharynx: No oropharyngeal exudate.  Eyes:     Pupils: Pupils are equal, round, and reactive to light.  Neck:     Thyroid: No thyromegaly.     Vascular: No JVD.     Trachea: No tracheal deviation.  Cardiovascular:     Rate and Rhythm: Normal rate and regular rhythm.     Heart sounds: Normal heart sounds. No murmur heard.   No friction rub. No gallop.  Pulmonary:     Effort: Pulmonary effort is normal. No respiratory distress.     Breath sounds: No wheezing or rales.  Chest:     Chest wall: No tenderness.  Abdominal:     General: Bowel sounds are normal.     Palpations: Abdomen is soft.  Musculoskeletal:        General: Normal range of motion.     Cervical back: Normal range of motion and neck supple.  Lymphadenopathy:     Cervical: No cervical adenopathy.  Skin:    General: Skin is warm and dry.  Neurological:     Mental Status: She is alert and oriented  to person, place, and time.     Cranial Nerves: No cranial nerve deficit.  Psychiatric:        Behavior: Behavior normal.        Thought Content: Thought content normal.        Judgment: Judgment normal.       Assessment/Plan: 1. Generalized anxiety  disorder Continue Lexapro at night as this seems to help with sleep, patient may also continue Xanax as needed.  We will also trial starting 1 tab of Wellbutrin in the morning to help with anxiety and depression throughout the day without causing drowsiness.   2. Mixed hyperlipidemia Continue simvastatin, will update labs - simvastatin (ZOCOR) 40 MG tablet; Take 1 tablet (40 mg total) by mouth at bedtime.  Dispense: 90 tablet; Refill: 3  3. Perennial allergic rhinitis - montelukast (SINGULAIR) 10 MG tablet; Take 1 tablet (10 mg total) by mouth at bedtime.  Dispense: 90 tablet; Refill: 1  4. Vitamin D deficiency - VITAMIN D 25 Hydroxy (Vit-D Deficiency, Fractures)  5. Hypokalemia - potassium chloride (KLOR-CON) 10 MEQ tablet; Take 1 tablet (10 mEq total) by mouth daily.  Dispense: 90 tablet; Refill: 1  6. B12 deficiency - B12 and Folate Panel  7. Other fatigue - CBC w/Diff/Platelet - Comprehensive metabolic panel - TSH + free T4 - Lipid Panel With LDL/HDL Ratio - Fe+TIBC+Fer  8. Flu vaccine need - Flu Vaccine MDCK QUAD PF   General Counseling: Pamelia verbalizes understanding of the findings of todays visit and agrees with plan of treatment. I have discussed any further diagnostic evaluation that may be needed or ordered today. We also reviewed her medications today. she has been encouraged to call the office with any questions or concerns that should arise related to todays visit.    Orders Placed This Encounter  Procedures   Flu Vaccine MDCK QUAD PF   CBC w/Diff/Platelet   Comprehensive metabolic panel   TSH + free T4   Lipid Panel With LDL/HDL Ratio   VITAMIN D 25 Hydroxy (Vit-D Deficiency, Fractures)   B12 and Folate Panel   Fe+TIBC+Fer    Meds ordered this encounter  Medications   buPROPion (WELLBUTRIN XL) 150 MG 24 hr tablet    Sig: Take 1 tablet (150 mg total) by mouth daily.    Dispense:  30 tablet    Refill:  2   potassium chloride (KLOR-CON) 10 MEQ tablet     Sig: Take 1 tablet (10 mEq total) by mouth daily.    Dispense:  90 tablet    Refill:  1   simvastatin (ZOCOR) 40 MG tablet    Sig: Take 1 tablet (40 mg total) by mouth at bedtime.    Dispense:  90 tablet    Refill:  3   montelukast (SINGULAIR) 10 MG tablet    Sig: Take 1 tablet (10 mg total) by mouth at bedtime.    Dispense:  90 tablet    Refill:  1    This patient was seen by Drema Dallas, PA-C in collaboration with Dr. Clayborn Bigness as a part of collaborative care agreement.   Total time spent:40 Minutes Time spent includes review of chart, medications, test results, and follow up plan with the patient.      Dr Lavera Guise Internal medicine

## 2021-06-09 DIAGNOSIS — E611 Iron deficiency: Secondary | ICD-10-CM | POA: Diagnosis not present

## 2021-06-09 DIAGNOSIS — E538 Deficiency of other specified B group vitamins: Secondary | ICD-10-CM | POA: Diagnosis not present

## 2021-06-09 DIAGNOSIS — R5383 Other fatigue: Secondary | ICD-10-CM | POA: Diagnosis not present

## 2021-06-09 DIAGNOSIS — E559 Vitamin D deficiency, unspecified: Secondary | ICD-10-CM | POA: Diagnosis not present

## 2021-06-10 LAB — CBC WITH DIFFERENTIAL/PLATELET
Basophils Absolute: 0.1 10*3/uL (ref 0.0–0.2)
Basos: 1 %
EOS (ABSOLUTE): 0.2 10*3/uL (ref 0.0–0.4)
Eos: 2 %
Hematocrit: 44.2 % (ref 34.0–46.6)
Hemoglobin: 14.8 g/dL (ref 11.1–15.9)
Immature Grans (Abs): 0 10*3/uL (ref 0.0–0.1)
Immature Granulocytes: 0 %
Lymphocytes Absolute: 2.1 10*3/uL (ref 0.7–3.1)
Lymphs: 23 %
MCH: 31.2 pg (ref 26.6–33.0)
MCHC: 33.5 g/dL (ref 31.5–35.7)
MCV: 93 fL (ref 79–97)
Monocytes Absolute: 0.8 10*3/uL (ref 0.1–0.9)
Monocytes: 8 %
Neutrophils Absolute: 6.1 10*3/uL (ref 1.4–7.0)
Neutrophils: 66 %
Platelets: 255 10*3/uL (ref 150–450)
RBC: 4.74 x10E6/uL (ref 3.77–5.28)
RDW: 13.8 % (ref 11.7–15.4)
WBC: 9.2 10*3/uL (ref 3.4–10.8)

## 2021-06-10 LAB — COMPREHENSIVE METABOLIC PANEL
ALT: 15 IU/L (ref 0–32)
AST: 13 IU/L (ref 0–40)
Albumin/Globulin Ratio: 1.8 (ref 1.2–2.2)
Albumin: 4.2 g/dL (ref 3.8–4.8)
Alkaline Phosphatase: 100 IU/L (ref 44–121)
BUN/Creatinine Ratio: 10 — ABNORMAL LOW (ref 12–28)
BUN: 8 mg/dL (ref 8–27)
Bilirubin Total: 0.3 mg/dL (ref 0.0–1.2)
CO2: 23 mmol/L (ref 20–29)
Calcium: 8.7 mg/dL (ref 8.7–10.3)
Chloride: 102 mmol/L (ref 96–106)
Creatinine, Ser: 0.77 mg/dL (ref 0.57–1.00)
Globulin, Total: 2.3 g/dL (ref 1.5–4.5)
Glucose: 81 mg/dL (ref 70–99)
Potassium: 4.6 mmol/L (ref 3.5–5.2)
Sodium: 137 mmol/L (ref 134–144)
Total Protein: 6.5 g/dL (ref 6.0–8.5)
eGFR: 86 mL/min/{1.73_m2} (ref >59)

## 2021-06-10 LAB — LIPID PANEL WITH LDL/HDL RATIO
Cholesterol, Total: 191 mg/dL (ref 100–199)
HDL: 54 mg/dL (ref >39)
LDL Chol Calc (NIH): 112 mg/dL — ABNORMAL HIGH (ref 0–99)
LDL/HDL Ratio: 2.1 ratio (ref 0.0–3.2)
Triglycerides: 139 mg/dL (ref 0–149)
VLDL Cholesterol Cal: 25 mg/dL (ref 5–40)

## 2021-06-10 LAB — TSH+FREE T4
Free T4: 1.01 ng/dL (ref 0.82–1.77)
TSH: 1.98 u[IU]/mL (ref 0.450–4.500)

## 2021-06-10 LAB — B12 AND FOLATE PANEL
Folate: >20 ng/mL (ref >3.0)
Vitamin B-12: 330 pg/mL (ref 232–1245)

## 2021-06-10 LAB — IRON,TIBC AND FERRITIN PANEL
Ferritin: 70 ng/mL (ref 15–150)
Iron Saturation: 24 % (ref 15–55)
Iron: 77 ug/dL (ref 27–139)
Total Iron Binding Capacity: 325 ug/dL (ref 250–450)
UIBC: 248 ug/dL (ref 118–369)

## 2021-06-10 LAB — VITAMIN D 25 HYDROXY (VIT D DEFICIENCY, FRACTURES): Vit D, 25-Hydroxy: 30.2 ng/mL (ref 30.0–100.0)

## 2021-06-28 ENCOUNTER — Ambulatory Visit: Payer: Medicare HMO | Admitting: Radiation Oncology

## 2021-07-09 ENCOUNTER — Ambulatory Visit: Payer: Medicare HMO | Admitting: Hospice and Palliative Medicine

## 2021-07-09 ENCOUNTER — Ambulatory Visit: Payer: Medicare HMO | Admitting: Physician Assistant

## 2021-07-12 ENCOUNTER — Telehealth: Payer: Self-pay

## 2021-07-12 ENCOUNTER — Other Ambulatory Visit: Payer: Self-pay | Admitting: Physician Assistant

## 2021-07-12 ENCOUNTER — Other Ambulatory Visit: Payer: Self-pay | Admitting: Nurse Practitioner

## 2021-07-12 DIAGNOSIS — F411 Generalized anxiety disorder: Secondary | ICD-10-CM

## 2021-07-12 MED ORDER — ALPRAZOLAM 0.25 MG PO TABS: 0.2500 mg | ORAL_TABLET | Freq: Two times a day (BID) | ORAL | 1 refills | Status: DC | PRN

## 2021-07-12 NOTE — Telephone Encounter (Signed)
Done

## 2021-07-15 ENCOUNTER — Other Ambulatory Visit: Payer: Self-pay

## 2021-07-15 DIAGNOSIS — J3089 Other allergic rhinitis: Secondary | ICD-10-CM

## 2021-07-15 MED ORDER — MONTELUKAST SODIUM 10 MG PO TABS: 10.0000 mg | ORAL_TABLET | Freq: Every day | ORAL | 1 refills | Status: DC

## 2021-07-15 MED ORDER — FLUTICASONE-SALMETEROL 250-50 MCG/ACT IN AEPB: 1.0000 | INHALATION_SPRAY | Freq: Two times a day (BID) | RESPIRATORY_TRACT | 1 refills | Status: DC

## 2021-07-19 ENCOUNTER — Ambulatory Visit
Admission: RE | Admit: 2021-07-19 | Discharge: 2021-07-19 | Disposition: A | Discharge status: Home / Self Care | Payer: Medicare HMO | Source: Ambulatory Visit | Attending: Radiation Oncology | Admitting: Radiation Oncology

## 2021-07-19 ENCOUNTER — Other Ambulatory Visit: Payer: Self-pay

## 2021-07-19 ENCOUNTER — Encounter: Payer: Self-pay | Admitting: Radiation Oncology

## 2021-07-19 ENCOUNTER — Ambulatory Visit (INDEPENDENT_AMBULATORY_CARE_PROVIDER_SITE_OTHER): Payer: Medicare HMO | Admitting: Physician Assistant

## 2021-07-19 ENCOUNTER — Encounter: Payer: Self-pay | Admitting: Physician Assistant

## 2021-07-19 VITALS — BP 114/66 | HR 77 | Temp 98.1°F | Resp 18 | Wt 144.0 lb

## 2021-07-19 VITALS — BP 131/82 | HR 79 | Temp 98.0°F | Resp 16 | Ht 60.0 in | Wt 144.0 lb

## 2021-07-19 DIAGNOSIS — M810 Age-related osteoporosis without current pathological fracture: Secondary | ICD-10-CM

## 2021-07-19 DIAGNOSIS — Z86000 Personal history of in-situ neoplasm of breast: Secondary | ICD-10-CM | POA: Diagnosis not present

## 2021-07-19 DIAGNOSIS — Z79811 Long term (current) use of aromatase inhibitors: Secondary | ICD-10-CM | POA: Diagnosis not present

## 2021-07-19 DIAGNOSIS — E782 Mixed hyperlipidemia: Secondary | ICD-10-CM | POA: Diagnosis not present

## 2021-07-19 DIAGNOSIS — D0511 Intraductal carcinoma in situ of right breast: Secondary | ICD-10-CM | POA: Diagnosis not present

## 2021-07-19 DIAGNOSIS — Z0001 Encounter for general adult medical examination with abnormal findings: Secondary | ICD-10-CM | POA: Diagnosis not present

## 2021-07-19 DIAGNOSIS — Z08 Encounter for follow-up examination after completed treatment for malignant neoplasm: Secondary | ICD-10-CM | POA: Diagnosis not present

## 2021-07-19 DIAGNOSIS — F411 Generalized anxiety disorder: Secondary | ICD-10-CM | POA: Diagnosis not present

## 2021-07-19 DIAGNOSIS — Z170 Estrogen receptor positive status [ER+]: Secondary | ICD-10-CM | POA: Insufficient documentation

## 2021-07-19 DIAGNOSIS — Z923 Personal history of irradiation: Secondary | ICD-10-CM | POA: Diagnosis not present

## 2021-07-19 DIAGNOSIS — R300 Dysuria: Secondary | ICD-10-CM | POA: Diagnosis not present

## 2021-07-19 NOTE — Progress Notes (Signed)
Radiation Oncology Follow up Note  Name: Michelle Moore   Date:   07/19/2021 MRN:  [id number] DOB: 1955/09/26    This 65 y.o. 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  Seen today in routine follow-up she is doing well.  She specifically denies breast tenderness cough or bone pain..  She had mammograms done in May which I reviewed were BI-RADS 1 negative.  She is currently on Arimidex tolerating that well without side effect.  COMPLICATIONS OF TREATMENT: none  FOLLOW UP COMPLIANCE: keeps appointments   PHYSICAL EXAM:  BP 114/66 (BP Location: Left Arm)   Pulse 77   Temp 98.1 F (36.7 C) (Tympanic)   Resp 18   Wt 144 lb (65.3 kg)   BMI 28.12 kg/m  Lungs are clear to A&P cardiac examination essentially unremarkable with regular rate and rhythm. No dominant mass or nodularity is noted in either breast in 2 positions examined. Incision is well-healed. No axillary or supraclavicular adenopathy is appreciated. Cosmetic result is excellent.  Well-developed well-nourished patient in NAD. HEENT reveals PERLA, EOMI, discs not visualized.  Oral cavity is clear. No oral mucosal lesions are identified. Neck is clear without evidence of cervical or supraclavicular adenopathy. Lungs are clear to A&P. Cardiac examination is essentially unremarkable with regular rate and rhythm without murmur rub or thrill. Abdomen is benign with no organomegaly or masses noted. Motor sensory and DTR levels are equal and symmetric in the upper and lower extremities. Cranial nerves II through XII are grossly intact. Proprioception is intact. No peripheral adenopathy or edema is identified. No motor or sensory levels  are noted. Crude visual fields are within normal range.  RADIOLOGY RESULTS: Mammograms reviewed compatible with above-stated findings  PLAN: Present time patient is now about 3 years with no evidence of disease.  And pleased with her overall progress I will see her back in 1 year for follow-up and then discontinue follow-up care.  She continues on Arimidex without side effect.  Patient knows to call with any concerns.  I would like to take this opportunity to thank you for allowing me to participate in the care of your patient.Noreene Filbert, MD

## 2021-07-19 NOTE — Progress Notes (Signed)
[facility] [place], [place] [address]  Internal MEDICINE  Office Visit Note  Patient Name: Michelle Moore  [id number]  [id number]  Date of Service: 07/21/2021  Chief Complaint  Patient presents with   Medicare Wellness   Anxiety   COPD     HPI Pt is here for routine health maintenance examination -She states she has not noticed a big difference with wellbutrin added and thinks she needs something else to help. She has also only been taking 1 tab of Lexapro at night. She had previously been told to try a half tab in the AM and full tab at night and has previously stated she thought this would make her sleepy and now states she never tried increasing the dose and has always done 1 tab at night.  Discussed trying 1 tab lexapro in PM, 1/2 tab in Am to try and can move to take both in PM if sleepy. Will continue wellbutrin at current dose for now as well. -Labs reviewed and overall look good except slightly elevated LDL. Will continue simvastatin -Just had a carotid US with Dr. 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 Take with a full glass of water on an empty stomach. Stand or sit upright for 1 hour after taking   ALPRAZolam (XANAX) 0.25 MG tablet Take 1 tablet (0.25 mg total) by mouth 2 (two) times daily as needed for anxiety.   anastrozole (ARIMIDEX) 1 MG tablet TAKE (1) TABLET BY MOUTH EVERY DAY   buPROPion (WELLBUTRIN XL) 150 MG 24 hr tablet Take 1 tablet (150 mg total) by mouth daily.   cetirizine (ZYRTEC) 10 MG tablet Take 10 mg by mouth daily as needed for allergies.    escitalopram (LEXAPRO) 20 MG tablet Take  half tab in am and one tab at night for depression and anxiety   fluticasone (FLONASE) 50 MCG/ACT nasal spray Place 2 sprays into both nostrils daily as needed for allergies.   fluticasone-salmeterol (ADVAIR) 250-50 MCG/ACT AEPB Inhale 1 puff into the lungs in the morning and at bedtime.   folic acid (FOLVITE) 818 MCG tablet Take 1,600 mcg by mouth daily.   methotrexate (RHEUMATREX) 2.5 MG tablet Take 20 mg by mouth every Wednesday.    montelukast (SINGULAIR) 10 MG tablet Take 1 tablet (10 mg total) by mouth at bedtime.   Olopatadine HCl 0.2 % SOLN Place 1 drop into both eyes daily as needed (allergies).   Omeprazole 20 MG TBEC Take 20 mg by mouth every morning.    potassium chloride (KLOR-CON) 10 MEQ tablet Take 1 tablet (10 mEq total) by mouth daily.   simvastatin (ZOCOR) 40 MG tablet Take 1 tablet (40 mg total) by mouth at bedtime.   No facility-administered encounter medications on file as of 07/19/2021.    Surgical History: Past Surgical History:  Procedure Laterality Date   ABDOMINAL HYSTERECTOMY     ANTERIOR CERVICAL DECOMP/DISCECTOMY FUSION N/A 01/20/2014   Procedure: ANTERIOR CERVICAL DECOMPRESSION/DISCECTOMY FUSION 2 LEVELS cervical five/six six [place];  Surgeon: Ophelia Charter, MD;  Location: [place] [facility];  Service: Neurosurgery;  Laterality: N/A;   BACK SURGERY  lumbar   BREAST BIOPSY Right 02/01/2018   x shape, DCIS    BREAST BIOPSY  01/16/2019   coil, BENIGN MAMMARY TISSUE WITH MODERATE STROMAL FIBROSIS AND COARSE DYSTROPHIC CALCIFICATIONS of the RIGHT breast   BREAST LUMPECTOMY Right 03/07/2018   Procedure: BREAST LUMPECTOMY/ RE EXCISION;  Surgeon: Herbert Pun, MD;  Location: [facility];  Service: General;  Laterality: Right;   CARDIAC CATHETERIZATION     2011   COLONOSCOPY WITH PROPOFOL N/A 02/11/2021   Procedure: COLONOSCOPY WITH PROPOFOL;  Surgeon: Lucilla Lame, MD;  Location: [place];  Service: Endoscopy;  Laterality: N/A;  Requests Early    CORONARY ANGIOPLASTY WITH STENT PLACEMENT     PLACED IN RCA 2011   EYE SURGERY     lasik   PARTIAL MASTECTOMY WITH NEEDLE LOCALIZATION Right 02/21/2018   Procedure: PARTIAL MASTECTOMY WITH NEEDLE LOCALIZATION;  Surgeon: Herbert Pun, MD;  Location: [facility];  Service: General;  Laterality: Right;   POLYPECTOMY N/A 02/11/2021   Procedure: POLYPECTOMY;  Surgeon: Lucilla Lame, MD;  Location: [place];  Service: Endoscopy;  Laterality: N/A;   ROTATOR CUFF REPAIR     left shoulder   SHOULDER ARTHROSCOPY WITH ROTATOR CUFF REPAIR AND OPEN BICEPS TENODESIS Right 08/07/2019   Procedure: SHOULDER ARTHROSCOPY WITH DEBRIDEMENT, DECOMPRESSION, ROTATOR CUFF REPAIR AND BICEPS TENODESIS. - RNFA;  Surgeon: Corky Mull, MD;  Location: [facility];  Service: Orthopedics;  Laterality: Right;   TOE SURGERY Right    has pin and plate in it    Medical History: Past Medical History:  Diagnosis Date   Anxiety    Arthritis    Breast cancer ([place])    Cancer ([place])    breast-right    COPD (chronic obstructive pulmonary disease) ([place])    Coronary artery disease    Dyspnea    ONLY WITH EXERTION   GERD (gastroesophageal reflux disease)    "sometimes"   Headache(784.0)    sinus headaches   Myocardial infarction ([place])    in 2011  no damage-1 stent   Personal history of radiation therapy    Seizures ([place]) 2014   X1-no idea what caused her seizure   Sleep apnea    USES CPAP    Family History: Family History  Problem Relation Age of Onset   Breast cancer Cousin 42       bilateral at 8 and 60; daughter of maternal aunt who was unaffected   Lung cancer Maternal Aunt        dx 71s; deceased 66s; smoker   Heart attack Father        deceased 52   Stomach cancer Maternal Grandmother       Review of Systems  Constitutional:  Negative for chills and unexpected weight change.  HENT:  Negative for congestion, postnasal drip, rhinorrhea, sneezing and sore throat.   Eyes:  Negative for  redness.  Respiratory:  Negative for cough, chest tightness and shortness of breath.   Cardiovascular:  Negative for chest pain and palpitations.  Gastrointestinal:  Negative for abdominal pain, constipation, diarrhea, nausea and vomiting.  Genitourinary:  Negative for dysuria and frequency.  Musculoskeletal:  Positive for arthralgias and back pain. Negative for joint swelling and neck pain.  Skin:  Negative for rash.  Allergic/Immunologic: Positive for environmental allergies.  Neurological: Negative.  Negative for tremors and numbness.  Hematological:  Negative for adenopathy. Does not bruise/bleed easily.  Psychiatric/Behavioral:  Positive for behavioral problems (Depression) and sleep disturbance. Negative for suicidal ideas. The patient  is nervous/anxious.     Vital Signs: BP 131/82   Pulse 79   Temp 98 F (36.7 C)   Resp 16   Ht 5' (1.524 m)   Wt 144 lb (65.3 kg)   SpO2 93%   BMI 28.12 kg/m    Physical Exam Vitals and nursing note reviewed.  Constitutional:      General: She is not in acute distress.    Appearance: She is well-developed. She is not diaphoretic.  HENT:     Head: Normocephalic and atraumatic.     Mouth/Throat:     Pharynx: No oropharyngeal exudate.  Eyes:     Pupils: Pupils are equal, round, and reactive to light.  Neck:     Thyroid: No thyromegaly.     Vascular: No JVD.     Trachea: No tracheal deviation.  Cardiovascular:     Rate and Rhythm: Normal rate and regular rhythm.     Heart sounds: Normal heart sounds. No murmur heard.   No friction rub. No gallop.  Pulmonary:     Effort: Pulmonary effort is normal. No respiratory distress.     Breath sounds: No wheezing or rales.  Chest:     Chest wall: No tenderness.  Abdominal:     General: Bowel sounds are normal.     Palpations: Abdomen is soft.  Musculoskeletal:        General: Normal range of motion.     Cervical back: Normal range of motion and neck supple.  Lymphadenopathy:      Cervical: No cervical adenopathy.  Skin:    General: Skin is warm and dry.  Neurological:     Mental Status: She is alert and oriented to person, place, and time.     Cranial Nerves: No cranial nerve deficit.  Psychiatric:        Behavior: Behavior normal.        Thought Content: Thought content normal.        Judgment: Judgment normal.     LABS: Recent Results (from the past 2160 hour(s))  CBC w/Diff/Platelet     Status: None   Collection Time: 06/09/21 11:25 AM  Result Value Ref Range   WBC 9.2 3.4 - 10.8 x10E3/uL   RBC 4.74 3.77 - 5.28 x10E6/uL   Hemoglobin 14.8 11.1 - 15.9 g/dL   Hematocrit 44.2 34.0 - 46.6 %   MCV 93 79 - 97 fL   MCH 31.2 26.6 - 33.0 pg   MCHC 33.5 31.5 - 35.7 g/dL   RDW 13.8 11.7 - 15.4 %   Platelets 255 150 - 450 x10E3/uL   Neutrophils 66 Not Estab. %   Lymphs 23 Not Estab. %   Monocytes 8 Not Estab. %   Eos 2 Not Estab. %   Basos 1 Not Estab. %   Neutrophils Absolute 6.1 1.4 - 7.0 x10E3/uL   Lymphocytes Absolute 2.1 0.7 - 3.1 x10E3/uL   Monocytes Absolute 0.8 0.1 - 0.9 x10E3/uL   EOS (ABSOLUTE) 0.2 0.0 - 0.4 x10E3/uL   Basophils Absolute 0.1 0.0 - 0.2 x10E3/uL   Immature Granulocytes 0 Not Estab. %   Immature Grans (Abs) 0.0 0.0 - 0.1 x10E3/uL  Comprehensive metabolic panel     Status: Abnormal   Collection Time: 06/09/21 11:25 AM  Result Value Ref Range   Glucose 81 70 - 99 mg/dL    Comment:               **Please note reference interval change**   BUN  8 8 - 27 mg/dL   Creatinine, Ser 0.77 0.57 - 1.00 mg/dL   eGFR 86 >59 mL/min/1.73   BUN/Creatinine Ratio 10 (L) 12 - 28   Sodium 137 134 - 144 mmol/L   Potassium 4.6 3.5 - 5.2 mmol/L   Chloride 102 96 - 106 mmol/L   CO2 23 20 - 29 mmol/L   Calcium 8.7 8.7 - 10.3 mg/dL   Total Protein 6.5 6.0 - 8.5 g/dL   Albumin 4.2 3.8 - 4.8 g/dL   Globulin, Total 2.3 1.5 - 4.5 g/dL   Albumin/Globulin Ratio 1.8 1.2 - 2.2   Bilirubin Total 0.3 0.0 - 1.2 mg/dL   Alkaline Phosphatase 100 44 - 121 IU/L    AST 13 0 - 40 IU/L   ALT 15 0 - 32 IU/L  TSH + free T4     Status: None   Collection Time: 06/09/21 11:25 AM  Result Value Ref Range   TSH 1.980 0.450 - 4.500 uIU/mL   Free T4 1.01 0.82 - 1.77 ng/dL  Lipid Panel With LDL/HDL Ratio     Status: Abnormal   Collection Time: 06/09/21 11:25 AM  Result Value Ref Range   Cholesterol, Total 191 100 - 199 mg/dL   Triglycerides 139 0 - 149 mg/dL   HDL 54 >39 mg/dL   VLDL Cholesterol Cal 25 5 - 40 mg/dL   LDL Chol Calc (NIH) 112 (H) 0 - 99 mg/dL   LDL/HDL Ratio 2.1 0.0 - 3.2 ratio    Comment:                                     LDL/HDL Ratio                                             Men  Women                               1/2 Avg.Risk  1.0    1.5                                   Avg.Risk  3.6    3.2                                2X Avg.Risk  6.2    5.0                                3X Avg.Risk  8.0    6.1   VITAMIN D 25 Hydroxy (Vit-D Deficiency, Fractures)     Status: None   Collection Time: 06/09/21 11:25 AM  Result Value Ref Range   Vit D, 25-Hydroxy 30.2 30.0 - 100.0 ng/mL    Comment: Vitamin D deficiency has been defined by the [place] practice guideline as a level of serum 25-OH vitamin D less than 20 ng/mL (1,2). The Endocrine Society went on to further define vitamin D insufficiency as a level between 21 and 29 ng/mL (2). 1. IOM (Institute of Medicine). 2010. Dietary  reference    intakes for calcium and D. [place]: The    [employer]. 2. Holick MF, Binkley [place], Bischoff-Ferrari HA, et al.    Evaluation, treatment, and prevention of vitamin D    deficiency: an Endocrine Society clinical practice    guideline. JCEM. 2011 Jul; 96(7):1911-30.   B12 and Folate Panel     Status: None   Collection Time: 06/09/21 11:25 AM  Result Value Ref Range   Vitamin B-12 330 232 - 1,245 pg/mL   Folate >20.0 >3.0 ng/mL    Comment: A serum folate concentration of less than 3.1 ng/mL  is considered to represent clinical deficiency.   Fe+TIBC+Fer     Status: None   Collection Time: 06/09/21 11:25 AM  Result Value Ref Range   Total Iron Binding Capacity 325 250 - 450 ug/dL   UIBC 248 118 - 369 ug/dL   Iron 77 27 - 139 ug/dL   Iron Saturation 24 15 - 55 %   Ferritin 70 15 - 150 ng/mL  UA/M w/rflx Culture, Routine     Status: None   Collection Time: 07/19/21  1:07 PM   Specimen: Urine   Urine  Result Value Ref Range   Specific Gravity, UA 1.009 1.005 - 1.030   pH, UA 5.5 5.0 - 7.5   Color, UA Yellow Yellow   Appearance Ur Clear Clear   Leukocytes,UA Negative Negative   Protein,UA Negative Negative/Trace   Glucose, UA Negative Negative   Ketones, UA Negative Negative   RBC, UA Negative Negative   Bilirubin, UA Negative Negative   Urobilinogen, Ur 0.2 0.2 - 1.0 mg/dL   Nitrite, UA Negative Negative   Microscopic Examination Comment     Comment: Microscopic follows if indicated.   Microscopic Examination See below:     Comment: Microscopic was indicated and was performed.   Urinalysis Reflex Comment     Comment: This specimen will not reflex to a Urine Culture.  Microscopic Examination     Status: None   Collection Time: 07/19/21  1:07 PM   Urine  Result Value Ref Range   WBC, UA None seen 0 - 5 /hpf   RBC 0-2 0 - 2 /hpf   Epithelial Cells (non renal) 0-10 0 - 10 /hpf   Casts None seen None seen /lpf   Bacteria, UA None seen None seen/Few        Assessment/Plan: 1. Encounter for general adult medical examination with abnormal findings CPE performed, routine labs reviewed, UTD on PHM  2. Generalized anxiety disorder Will try increasing lexapro to 1 tab at night and 1/2 tab in Am--may switch to 1.5tabs at night if drowsiness occurs with morning dose. Will continue wellbutrin.  3. Mixed hyperlipidemia Continue simvastatin  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 Dysuria - UA/M w/rflx Culture, Routine  6.  Osteoporosis without current pathological fracture, unspecified osteoporosis type On fosamax, followed by oncology   General Counseling: Valentina Gu understanding of the findings of todays visit and agrees with plan of treatment. I have discussed any further diagnostic evaluation that may be needed or ordered today. We also reviewed her medications today. she has been encouraged to call the office with any questions or concerns that should arise related to todays visit.    Counseling:    Orders Placed This Encounter  Procedures   Microscopic Examination   UA/M w/rflx Culture, Routine    No orders of the defined types  were placed in this encounter.   This patient was seen by Drema Dallas, PA-C in collaboration with Dr. Clayborn Bigness as a part of collaborative care agreement.  Total time spent:35 Minutes  Time spent includes review of chart, medications, test results, and follow up plan with the patient.     Lavera Guise, MD  Internal Medicine

## 2021-07-20 LAB — UA/M W/RFLX CULTURE, ROUTINE
Bilirubin, UA: NEGATIVE
Glucose, UA: NEGATIVE
Ketones, UA: NEGATIVE
Leukocytes,UA: NEGATIVE
Nitrite, UA: NEGATIVE
Protein,UA: NEGATIVE
RBC, UA: NEGATIVE
Specific Gravity, UA: 1.009 (ref 1.005–1.030)
Urobilinogen, Ur: 0.2 mg/dL (ref 0.2–1.0)
pH, UA: 5.5 (ref 5.0–7.5)

## 2021-07-20 LAB — MICROSCOPIC EXAMINATION
Bacteria, UA: NONE SEEN
Casts: NONE SEEN /lpf
WBC, UA: NONE SEEN /hpf (ref 0–5)

## 2021-08-23 ENCOUNTER — Inpatient Hospital Stay: Payer: Medicare HMO | Attending: Oncology | Admitting: Oncology

## 2021-08-30 ENCOUNTER — Other Ambulatory Visit: Payer: Self-pay | Admitting: Physician Assistant

## 2021-09-01 ENCOUNTER — Telehealth (INDEPENDENT_AMBULATORY_CARE_PROVIDER_SITE_OTHER): Payer: Medicare HMO | Admitting: Nurse Practitioner

## 2021-09-01 ENCOUNTER — Encounter: Payer: Self-pay | Admitting: Nurse Practitioner

## 2021-09-01 VITALS — Ht 60.0 in | Wt 144.0 lb

## 2021-09-01 DIAGNOSIS — J0140 Acute pansinusitis, unspecified: Secondary | ICD-10-CM

## 2021-09-01 DIAGNOSIS — R051 Acute cough: Secondary | ICD-10-CM

## 2021-09-01 MED ORDER — AMOXICILLIN-POT CLAVULANATE 875-125 MG PO TABS
1.0000 | ORAL_TABLET | Freq: Two times a day (BID) | ORAL | 0 refills | Status: DC
Start: 2021-09-01 — End: 2021-10-18

## 2021-09-01 NOTE — Progress Notes (Signed)
[facility] [place], [place] [address]  Internal MEDICINE  Telephone Visit  Patient Name: Michelle Moore  [id number]  [id number]  Date of Service: 09/01/2021  I connected with the patient at 3:20 PM by telephone and verified the patients identity using two identifiers.   I discussed the limitations, risks, security and privacy concerns of performing an evaluation and management service by telephone and the availability of in person appointments. I also discussed with the patient that there may be a patient responsible charge related to the service.  The patient expressed understanding and agrees to proceed.    Chief Complaint  Patient presents with   Telephone Screen    [id number]   Telephone Assessment   Sore Throat   Sinusitis   Nasal Congestion   Cough   Ear Pain    HPI Michelle Moore presents for a telehealth virtual visit for symptoms of sinusitis. She reports sore throat, nasal congestion, cough and ear pain. It got better for a few days and then got worse again. She did a home covid test and it was negative today.   Current Medication: Outpatient Encounter Medications as of 09/01/2021  Medication Sig   alendronate (FOSAMAX) 70 MG tablet Take 1 tablet (70 mg total) by mouth once a week. Take with a full glass of water on an empty stomach. Stand or sit upright for 1 hour after taking   ALPRAZolam (XANAX) 0.25 MG tablet Take 1 tablet (0.25 mg total) by mouth 2 (two) times daily as needed for anxiety.   amoxicillin-clavulanate (AUGMENTIN) 875-125 MG tablet Take 1 tablet by mouth 2 (two) times daily.   anastrozole (ARIMIDEX) 1 MG tablet TAKE (1) TABLET BY MOUTH EVERY DAY   buPROPion (WELLBUTRIN XL) 150 MG 24 hr tablet TAKE (1) TABLET BY MOUTH EVERY DAY   cetirizine (ZYRTEC) 10 MG tablet Take 10 mg by mouth daily as needed for allergies.    escitalopram (LEXAPRO) 20 MG tablet Take half tab in am and one tab at night for depression and anxiety   fluticasone  (FLONASE) 50 MCG/ACT nasal spray Place 2 sprays into both nostrils daily as needed for allergies.   fluticasone-salmeterol (ADVAIR) 250-50 MCG/ACT AEPB Inhale 1 puff into the lungs in the morning and at bedtime.   folic acid (FOLVITE) 400 MCG tablet Take 1,600 mcg by mouth daily.   methotrexate (RHEUMATREX) 2.5 MG tablet Take 20 mg by mouth every Wednesday.    montelukast (SINGULAIR) 10 MG tablet Take 1 tablet (10 mg total) by mouth at bedtime.   Olopatadine HCl 0.2 % SOLN Place 1 drop into both eyes daily as needed (allergies).   Omeprazole 20 MG TBEC Take 20 mg by mouth every morning.    potassium chloride (KLOR-CON) 10 MEQ tablet Take 1 tablet (10 mEq total) by mouth daily.   potassium chloride (KLOR-CON) 10 MEQ tablet    simvastatin (ZOCOR) 40 MG tablet Take 1 tablet (40 mg total) by mouth at bedtime.   traMADol (ULTRAM) 50 MG tablet    No facility-administered encounter medications on file as of 09/01/2021.    Surgical History: Past Surgical History:  Procedure Laterality Date   ABDOMINAL HYSTERECTOMY     ANTERIOR CERVICAL DECOMP/DISCECTOMY FUSION N/A 01/20/2014   Procedure: ANTERIOR CERVICAL DECOMPRESSION/DISCECTOMY FUSION 2 LEVELS cervical five/six six Michelle Moore;  Surgeon: Ophelia Charter, MD;  Location: [place] [facility];  Service: Neurosurgery;  Laterality: N/A;   BACK SURGERY     lumbar   BREAST BIOPSY Right 02/01/2018  x shape, DCIS    BREAST BIOPSY  01/16/2019   coil, BENIGN MAMMARY TISSUE WITH MODERATE STROMAL FIBROSIS AND COARSE DYSTROPHIC CALCIFICATIONS of the RIGHT breast   BREAST LUMPECTOMY Right 03/07/2018   Procedure: BREAST LUMPECTOMY/ RE EXCISION;  Surgeon: Herbert Pun, MD;  Location: [facility];  Service: General;  Laterality: Right;   CARDIAC CATHETERIZATION     2011   COLONOSCOPY WITH PROPOFOL N/A 02/11/2021   Procedure: COLONOSCOPY WITH PROPOFOL;  Surgeon: Lucilla Lame, MD;  Location: [place];  Service: Endoscopy;  Laterality: N/A;  Requests Early    CORONARY ANGIOPLASTY WITH STENT PLACEMENT     PLACED IN RCA 2011   EYE SURGERY     lasik   PARTIAL MASTECTOMY WITH NEEDLE LOCALIZATION Right 02/21/2018   Procedure: PARTIAL MASTECTOMY WITH NEEDLE LOCALIZATION;  Surgeon: Herbert Pun, MD;  Location: [facility];  Service: General;  Laterality: Right;   POLYPECTOMY N/A 02/11/2021   Procedure: POLYPECTOMY;  Surgeon: Lucilla Lame, MD;  Location: [place];  Service: Endoscopy;  Laterality: N/A;   ROTATOR CUFF REPAIR     left shoulder   SHOULDER ARTHROSCOPY WITH ROTATOR CUFF REPAIR AND OPEN BICEPS TENODESIS Right 08/07/2019   Procedure: SHOULDER ARTHROSCOPY WITH DEBRIDEMENT, DECOMPRESSION, ROTATOR CUFF REPAIR AND BICEPS TENODESIS. - RNFA;  Surgeon: Corky Mull, MD;  Location: [facility];  Service: Orthopedics;  Laterality: Right;   TOE SURGERY Right    has pin and plate in it    Medical History: Past Medical History:  Diagnosis Date   Anxiety    Arthritis    Breast cancer ([place])    Cancer ([place])    breast-right    COPD (chronic obstructive pulmonary disease) ([place])    Coronary artery disease    Dyspnea    ONLY WITH EXERTION   GERD (gastroesophageal reflux disease)    "sometimes"   Headache(784.0)    sinus headaches   Myocardial infarction ([place])    in 2011  no damage-1 stent   Personal history of radiation therapy    Seizures ([place]) 2014   X1-no idea what caused her seizure   Sleep apnea    USES CPAP    Family History: Family History  Problem Relation Age of Onset   Breast cancer Cousin 28       bilateral at 80 and 66; daughter of maternal aunt who was unaffected   Lung cancer Maternal Aunt        dx 27s; deceased 40s; smoker   Heart attack Father        deceased 32   Stomach cancer Maternal Grandmother     Social History   Socioeconomic History   Marital status: Married    Spouse name: Not on file   Number of children: Not on file   Years of education: Not on file   Highest education level: Not on  file  Occupational History   Not on file  Tobacco Use   Smoking status: Former    Packs/day: 1.00    Years: 30.00    Pack years: 30.00    Types: Cigarettes    Quit date: 09/13/2015    Years since quitting: 5.9   Smokeless tobacco: Never  Vaping Use   Vaping Use: Never used  Substance and Sexual Activity   Alcohol use: Yes    Comment: occasional   Drug use: No   Sexual activity: Not on file  Other Topics Concern   Not on file  Social History Narrative   Not  on file   Social Determinants of Health   Financial Resource Strain: Not on file  Food Insecurity: Not on file  Transportation Needs: Not on file  Physical Activity: Not on file  Stress: Not on file  Social Connections: Not on file  Intimate Partner Violence: Not on file      Review of Systems  Constitutional:  Positive for chills, fatigue and fever.  HENT:  Positive for congestion, ear pain, postnasal drip, rhinorrhea, sinus pressure, sinus pain, sneezing and sore throat.   Respiratory:  Positive for cough. Negative for chest tightness, shortness of breath and wheezing.   Cardiovascular: Negative.  Negative for chest pain and palpitations.  Gastrointestinal: Negative.  Negative for abdominal pain, constipation, diarrhea, nausea and vomiting.  Musculoskeletal: Negative.  Negative for myalgias.  Skin: Negative.  Negative for rash.  Neurological:  Negative for dizziness, light-headedness and headaches.   Vital Signs: Ht 5' (1.524 m)    Wt 144 lb (65.3 kg)    BMI 28.12 kg/m    Observation/Objective: She is alert and oriented and engages in conversation appropriately. She does not appear to be in any acute distress over video call and is currently in her kitchen baking cookies while engaging in her virtual visit.     Assessment/Plan: 1. Acute non-recurrent pansinusitis Empiric antibiotic treatment prescribed. - amoxicillin-clavulanate (AUGMENTIN) 875-125 MG tablet; Take 1 tablet by mouth 2 (two) times daily.   Dispense: 20 tablet; Refill: 0  2. Acute cough Continue OTC medication as needed (mucinex per patient)   General Counseling: Valentina Gu understanding of the findings of today's phone visit and agrees with plan of treatment. I have discussed any further diagnostic evaluation that may be needed or ordered today. We also reviewed her medications today. she has been encouraged to call the office with any questions or concerns that should arise related to todays visit.  Return if symptoms worsen or fail to improve.   No orders of the defined types were placed in this encounter.   Meds ordered this encounter  Medications   amoxicillin-clavulanate (AUGMENTIN) 875-125 MG tablet    Sig: Take 1 tablet by mouth 2 (two) times daily.    Dispense:  20 tablet    Refill:  0    Time spent:10 Minutes Time spent with patient included reviewing progress notes, labs, imaging studies, and discussing plan for follow up.  [place] Controlled Substance Database was reviewed by me for overdose risk score (ORS) if appropriate.  This patient was seen by Jonetta Osgood, FNP-C in collaboration with Dr. Clayborn Bigness as a part of collaborative care agreement.  Zayon Trulson R. Valetta Fuller, MSN, FNP-C Internal medicine

## 2021-09-02 DIAGNOSIS — M65342 Trigger finger, left ring finger: Secondary | ICD-10-CM | POA: Diagnosis not present

## 2021-09-02 DIAGNOSIS — M2021 Hallux rigidus, right foot: Secondary | ICD-10-CM | POA: Diagnosis not present

## 2021-09-02 DIAGNOSIS — M0579 Rheumatoid arthritis with rheumatoid factor of multiple sites without organ or systems involvement: Secondary | ICD-10-CM | POA: Diagnosis not present

## 2021-09-02 DIAGNOSIS — M79672 Pain in left foot: Secondary | ICD-10-CM | POA: Diagnosis not present

## 2021-09-02 DIAGNOSIS — G5762 Lesion of plantar nerve, left lower limb: Secondary | ICD-10-CM | POA: Diagnosis not present

## 2021-09-02 DIAGNOSIS — M79671 Pain in right foot: Secondary | ICD-10-CM | POA: Diagnosis not present

## 2021-09-09 ENCOUNTER — Other Ambulatory Visit: Payer: Self-pay | Admitting: Nurse Practitioner

## 2021-09-17 ENCOUNTER — Other Ambulatory Visit: Payer: Self-pay | Admitting: *Deleted

## 2021-09-17 MED ORDER — ALENDRONATE SODIUM 70 MG PO TABS: 70.0000 mg | ORAL_TABLET | ORAL | 1 refills | Status: DC

## 2021-09-20 ENCOUNTER — Encounter: Payer: Self-pay | Admitting: Physician Assistant

## 2021-09-20 ENCOUNTER — Ambulatory Visit (INDEPENDENT_AMBULATORY_CARE_PROVIDER_SITE_OTHER): Payer: Medicare HMO | Admitting: Physician Assistant

## 2021-09-20 ENCOUNTER — Other Ambulatory Visit: Payer: Self-pay

## 2021-09-20 DIAGNOSIS — D0511 Intraductal carcinoma in situ of right breast: Secondary | ICD-10-CM

## 2021-09-20 DIAGNOSIS — J3089 Other allergic rhinitis: Secondary | ICD-10-CM

## 2021-09-20 DIAGNOSIS — M810 Age-related osteoporosis without current pathological fracture: Secondary | ICD-10-CM

## 2021-09-20 DIAGNOSIS — F411 Generalized anxiety disorder: Secondary | ICD-10-CM

## 2021-09-20 DIAGNOSIS — E782 Mixed hyperlipidemia: Secondary | ICD-10-CM | POA: Diagnosis not present

## 2021-09-20 MED ORDER — ALPRAZOLAM 0.25 MG PO TABS: 0.2500 mg | ORAL_TABLET | Freq: Two times a day (BID) | ORAL | 1 refills | Status: DC | PRN

## 2021-09-20 NOTE — Progress Notes (Signed)
[facility] [place], [place] [address]  Internal MEDICINE  Office Visit Note  Patient Name: Michelle Moore  [id number]  [id number]  Date of Service: 09/21/2021  Chief Complaint  Patient presents with   Follow-up   Gastroesophageal Reflux   Arthritis   Anxiety   COPD   Ear Pain    Had an ear infection around christmas, finished antibiotics but still thinks it may be infected   Medication Refill    Needs refill on Alprazolam    HPI Pt is here for routine follow up -Pt has completed ABX last week for right ear infection and sinus congestion. Got better but now feels like it is coming back some with right ear still bothersome. Will try OTC meds for now but call if worsening again as another round of ABX may be indicated.   -1.5 lexapro didn't help much, has been taking 1 lexparo at night and takes 1 xanax during day and one at night, plus wellbutrin in AM -will try scaling back down on xanax once she know whether her mom is going to stay with her as this is the cause of much of her anxiety.May try getting home health to help with her mom as she currently is limited in what she can do/where she can go.   Current Medication: Outpatient Encounter Medications as of 09/20/2021  Medication Sig   alendronate (FOSAMAX) 70 MG tablet Take 1 tablet (70 mg total) by mouth once a week. Take with a full glass of water on an empty stomach. Stand or sit upright for 1 hour after taking   amoxicillin-clavulanate (AUGMENTIN) 875-125 MG tablet Take 1 tablet by mouth 2 (two) times daily.   anastrozole (ARIMIDEX) 1 MG tablet TAKE (1) TABLET BY MOUTH EVERY DAY   buPROPion (WELLBUTRIN XL) 150 MG 24 hr tablet TAKE (1) TABLET BY MOUTH EVERY DAY   cetirizine (ZYRTEC) 10 MG tablet Take 10 mg by mouth daily as needed for allergies.    escitalopram (LEXAPRO) 20 MG tablet Take half tab in am and one tab at night for depression and anxiety   fluticasone (FLONASE) 50 MCG/ACT nasal spray Place  2 sprays into both nostrils daily as needed for allergies.   fluticasone-salmeterol (ADVAIR) 250-50 MCG/ACT AEPB Inhale 1 puff into the lungs in the morning and at bedtime.   folic acid (FOLVITE) 716 MCG tablet Take 1,600 mcg by mouth daily.   methotrexate (RHEUMATREX) 2.5 MG tablet Take 20 mg by mouth every Wednesday.    montelukast (SINGULAIR) 10 MG tablet Take 1 tablet (10 mg total) by mouth at bedtime.   Olopatadine HCl 0.2 % SOLN Place 1 drop into both eyes daily as needed (allergies).   Omeprazole 20 MG TBEC Take 20 mg by mouth every morning.    potassium chloride (KLOR-CON) 10 MEQ tablet Take 1 tablet (10 mEq total) by mouth daily.   potassium chloride (KLOR-CON) 10 MEQ tablet    simvastatin (ZOCOR) 40 MG tablet Take 1 tablet (40 mg total) by mouth at bedtime.   traMADol (ULTRAM) 50 MG tablet    [DISCONTINUED] ALPRAZolam (XANAX) 0.25 MG tablet Take 1 tablet (0.25 mg total) by mouth 2 (two) times daily as needed for anxiety.   ALPRAZolam (XANAX) 0.25 MG tablet Take 1 tablet (0.25 mg total) by mouth 2 (two) times daily as needed for anxiety.   No facility-administered encounter medications on file as of 09/20/2021.    Surgical History: Past Surgical History:  Procedure Laterality Date  ABDOMINAL HYSTERECTOMY     ANTERIOR CERVICAL DECOMP/DISCECTOMY FUSION N/A 01/20/2014   Procedure: ANTERIOR CERVICAL DECOMPRESSION/DISCECTOMY FUSION 2 LEVELS cervical five/six six Tarry Kos;  Surgeon: Ophelia Charter, MD;  Location: [facility];  Service: Neurosurgery;  Laterality: N/A;   BACK SURGERY     lumbar   BREAST BIOPSY Right 02/01/2018   x shape, DCIS    BREAST BIOPSY  01/16/2019   coil, BENIGN MAMMARY TISSUE WITH MODERATE STROMAL FIBROSIS AND COARSE DYSTROPHIC CALCIFICATIONS of the RIGHT breast   BREAST LUMPECTOMY Right 03/07/2018   Procedure: BREAST LUMPECTOMY/ RE EXCISION;  Surgeon: Herbert Pun, MD;  Location: [facility];  Service: General;  Laterality: Right;   CARDIAC  CATHETERIZATION     2011   COLONOSCOPY WITH PROPOFOL N/A 02/11/2021   Procedure: COLONOSCOPY WITH PROPOFOL;  Surgeon: Lucilla Lame, MD;  Location: [facility];  Service: Endoscopy;  Laterality: N/A;  Requests Early   CORONARY ANGIOPLASTY WITH STENT PLACEMENT     PLACED IN RCA 2011   EYE SURGERY     lasik   PARTIAL MASTECTOMY WITH NEEDLE LOCALIZATION Right 02/21/2018   Procedure: PARTIAL MASTECTOMY WITH NEEDLE LOCALIZATION;  Surgeon: Herbert Pun, MD;  Location: [facility];  Service: General;  Laterality: Right;   POLYPECTOMY N/A 02/11/2021   Procedure: POLYPECTOMY;  Surgeon: Lucilla Lame, MD;  Location: [facility];  Service: Endoscopy;  Laterality: N/A;   ROTATOR CUFF REPAIR     left shoulder   SHOULDER ARTHROSCOPY WITH ROTATOR CUFF REPAIR AND OPEN BICEPS TENODESIS Right 08/07/2019   Procedure: SHOULDER ARTHROSCOPY WITH DEBRIDEMENT, DECOMPRESSION, ROTATOR CUFF REPAIR AND BICEPS TENODESIS. - RNFA;  Surgeon: Corky Mull, MD;  Location: [facility];  Service: Orthopedics;  Laterality: Right;   TOE SURGERY Right    has pin and plate in it    Medical History: Past Medical History:  Diagnosis Date   Anxiety    Arthritis    Breast cancer ([place])    Cancer ([place])    breast-right    COPD (chronic obstructive pulmonary disease) ([place])    Coronary artery disease    Dyspnea    ONLY WITH EXERTION   GERD (gastroesophageal reflux disease)    "sometimes"   Headache(784.0)    sinus headaches   Myocardial infarction ([place])    in 2011  no damage-1 stent   Personal history of radiation therapy    Seizures ([place]) 2014   X1-no idea what caused her seizure   Sleep apnea    USES CPAP    Family History: Family History  Problem Relation Age of Onset   Breast cancer Cousin 58       bilateral at 64 and 69; daughter of maternal aunt who was unaffected   Lung cancer Maternal Aunt        dx 22s; deceased 8s; smoker   Heart attack Father        deceased 101   Stomach cancer Maternal  Grandmother     Social History   Socioeconomic History   Marital status: Married    Spouse name: Not on file   Number of children: Not on file   Years of education: Not on file   Highest education level: Not on file  Occupational History   Not on file  Tobacco Use   Smoking status: Former    Packs/day: 1.00    Years: 30.00    Pack years: 30.00    Types: Cigarettes    Quit date: 09/13/2015    Years since  quitting: 6.0   Smokeless tobacco: Never  Vaping Use   Vaping Use: Never used  Substance and Sexual Activity   Alcohol use: Yes    Comment: occasional   Drug use: No   Sexual activity: Not on file  Other Topics Concern   Not on file  Social History Narrative   Not on file   Social Determinants of Health   Financial Resource Strain: Not on file  Food Insecurity: Not on file  Transportation Needs: Not on file  Physical Activity: Not on file  Stress: Not on file  Social Connections: Not on file  Intimate Partner Violence: Not on file      Review of Systems  Constitutional:  Negative for chills and unexpected weight change.  HENT:  Positive for congestion. Negative for postnasal drip, rhinorrhea, sneezing and sore throat.   Eyes:  Negative for redness.  Respiratory:  Negative for cough, chest tightness and shortness of breath.   Cardiovascular:  Negative for chest pain and palpitations.  Gastrointestinal:  Negative for abdominal pain, constipation, diarrhea, nausea and vomiting.  Genitourinary:  Negative for dysuria and frequency.  Musculoskeletal:  Positive for arthralgias and back pain. Negative for joint swelling and neck pain.  Skin:  Negative for rash.  Allergic/Immunologic: Positive for environmental allergies.  Neurological: Negative.  Negative for tremors and numbness.  Hematological:  Negative for adenopathy. Does not bruise/bleed easily.  Psychiatric/Behavioral:  Positive for behavioral problems (Depression) and sleep disturbance. Negative for suicidal  ideas. The patient is nervous/anxious.    Vital Signs: BP 134/76    Pulse 75    Temp 98 F (36.7 C)    Resp 16    Ht 5\' 1"  (1.549 m)    Wt 146 lb 12.8 oz (66.6 kg)    SpO2 97%    BMI 27.74 kg/m    Physical Exam Vitals and nursing note reviewed.  Constitutional:      General: She is not in acute distress.    Appearance: She is well-developed. She is not diaphoretic.  HENT:     Head: Normocephalic and atraumatic.     Mouth/Throat:     Pharynx: No oropharyngeal exudate.  Eyes:     Pupils: Pupils are equal, round, and reactive to light.  Neck:     Thyroid: No thyromegaly.     Vascular: No JVD.     Trachea: No tracheal deviation.  Cardiovascular:     Rate and Rhythm: Normal rate and regular rhythm.     Heart sounds: Normal heart sounds. No murmur heard.   No friction rub. No gallop.  Pulmonary:     Effort: Pulmonary effort is normal. No respiratory distress.     Breath sounds: No wheezing or rales.  Chest:     Chest wall: No tenderness.  Abdominal:     General: Bowel sounds are normal.     Palpations: Abdomen is soft.  Musculoskeletal:        General: Normal range of motion.     Cervical back: Normal range of motion and neck supple.  Lymphadenopathy:     Cervical: No cervical adenopathy.  Skin:    General: Skin is warm and dry.  Neurological:     Mental Status: She is alert and oriented to person, place, and time.     Cranial Nerves: No cranial nerve deficit.  Psychiatric:        Behavior: Behavior normal.        Thought Content: Thought content normal.  Judgment: Judgment normal.       Assessment/Plan: 1. Generalized anxiety disorder May continue xanax as needed and continue 1 tab lexapro and wellbutrin daily. Will need UDS next visit - ALPRAZolam (XANAX) 0.25 MG tablet; Take 1 tablet (0.25 mg total) by mouth 2 (two) times daily as needed for anxiety.  Dispense: 60 tablet; Refill: 1 [place] Controlled Substance Database was reviewed by me for overdose risk score  (ORS)  2. Mixed hyperlipidemia Stable, continue simvastatin  3. Perennial allergic rhinitis Continue singulair and flonase. If worsening congestion and symptoms will notify office.  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 Osteoporosis without current pathological fracture, unspecified osteoporosis type On fosamax, followed by oncology   General Counseling: Valentina Gu understanding of the findings of todays visit and agrees with plan of treatment. I have discussed any further diagnostic evaluation that may be needed or ordered today. We also reviewed her medications today. she has been encouraged to call the office with any questions or concerns that should arise related to todays visit.    No orders of the defined types were placed in this encounter.   Meds ordered this encounter  Medications   ALPRAZolam (XANAX) 0.25 MG tablet    Sig: Take 1 tablet (0.25 mg total) by mouth 2 (two) times daily as needed for anxiety.    Dispense:  60 tablet    Refill:  1    This patient was seen by Drema Dallas, PA-C in collaboration with Dr. Clayborn Bigness as a part of collaborative care agreement.   Total time spent:30 Minutes Time spent includes review of chart, medications, test results, and follow up plan with the patient.      Dr Lavera Guise Internal medicine

## 2021-09-24 ENCOUNTER — Other Ambulatory Visit: Payer: Self-pay

## 2021-09-24 ENCOUNTER — Telehealth: Payer: Self-pay

## 2021-09-24 MED ORDER — FLUCONAZOLE 150 MG PO TABS: ORAL_TABLET | ORAL | 0 refills | Status: DC

## 2021-09-24 MED ORDER — SULFAMETHOXAZOLE-TRIMETHOPRIM 800-160 MG PO TABS: 1.0000 | ORAL_TABLET | Freq: Two times a day (BID) | ORAL | 0 refills | Status: DC

## 2021-09-24 NOTE — Telephone Encounter (Signed)
Pt called that she saw lauren on 09/20/2021 and her sinusitis not getting better as per lauren send bactrim and diflucan

## 2021-09-24 NOTE — Telephone Encounter (Signed)
Seth Bake from [facility] called and advised there was a major interaction with bactrim and methotrexate and also a interaction with Diflucan and simvastatin, per Ander Purpura it is ok for pt to take as she only takes methotrexate once a week

## 2021-10-18 ENCOUNTER — Telehealth (INDEPENDENT_AMBULATORY_CARE_PROVIDER_SITE_OTHER): Payer: Medicare HMO | Admitting: Physician Assistant

## 2021-10-18 ENCOUNTER — Telehealth: Payer: Self-pay

## 2021-10-18 VITALS — Temp 98.5°F | Resp 16

## 2021-10-18 DIAGNOSIS — U071 COVID-19: Secondary | ICD-10-CM | POA: Diagnosis not present

## 2021-10-18 MED ORDER — AZITHROMYCIN 250 MG PO TABS: ORAL_TABLET | ORAL | 0 refills | Status: DC

## 2021-10-18 MED ORDER — PREDNISONE 10 MG PO TABS: ORAL_TABLET | ORAL | 0 refills | Status: DC

## 2021-10-18 NOTE — Telephone Encounter (Signed)
As per Michelle Moore pt advised that we send Zithromax and prednisone due to paxlovid interact with statin med

## 2021-10-18 NOTE — Progress Notes (Signed)
[facility] [place], [place] [address]  Internal MEDICINE  Telephone Visit  Patient Name: Michelle Moore  [id number]  [id number]  Date of Service: 10/18/2021  I connected with the patient at 3:28 by telephone and verified the patients identity using two identifiers.   I discussed the limitations, risks, security and privacy concerns of performing an evaluation and management service by telephone and the availability of in person appointments. I also discussed with the patient that there may be a patient responsible charge related to the service.  The patient expressed understanding and agrees to proceed.    Chief Complaint  Patient presents with   Telephone Screen   Telephone Assessment   Generalized Body Aches    Has horrible body aches from head to toe - COVID positive   Headache   Cough    HPI Pt is here for a virtual sick visit -She is covid positive -Started feeling ill on Sat, aching a little Friday, but then Sat worse. Has a little cough, head congestion, and headaches. Ears hurt some as well, but body aches are the worse part. -tried tylenol, excedrin, antihistamine, nasal spray, has been using her inhaler and delsym. Did try coricidin cold and flu initially but then tried allergy pill instead. - No Sob or wheezing.   Current Medication: Outpatient Encounter Medications as of 10/18/2021  Medication Sig   azithromycin (ZITHROMAX) 250 MG tablet Take one tab a day for 10 days for uri   predniSONE (DELTASONE) 10 MG tablet Take one tab 3 x day for 3 days, then take one tab 2 x a day for 3 days and then take one tab a day for 3 days   alendronate (FOSAMAX) 70 MG tablet Take 1 tablet (70 mg total) by mouth once a week. Take with a full glass of water on an empty stomach. Stand or sit upright for 1 hour after taking   ALPRAZolam (XANAX) 0.25 MG tablet Take 1 tablet (0.25 mg total) by mouth 2 (two) times daily as needed for anxiety.   anastrozole (ARIMIDEX) 1  MG tablet TAKE (1) TABLET BY MOUTH EVERY DAY   buPROPion (WELLBUTRIN XL) 150 MG 24 hr tablet TAKE (1) TABLET BY MOUTH EVERY DAY   cetirizine (ZYRTEC) 10 MG tablet Take 10 mg by mouth daily as needed for allergies.    escitalopram (LEXAPRO) 20 MG tablet Take half tab in am and one tab at night for depression and anxiety   fluconazole (DIFLUCAN) 150 MG tablet Take 1 tab po once and may repeat in 3 days if symptoms persist   fluticasone (FLONASE) 50 MCG/ACT nasal spray Place 2 sprays into both nostrils daily as needed for allergies.   fluticasone-salmeterol (ADVAIR) 250-50 MCG/ACT AEPB Inhale 1 puff into the lungs in the morning and at bedtime.   folic acid (FOLVITE) 062 MCG tablet Take 1,600 mcg by mouth daily.   methotrexate (RHEUMATREX) 2.5 MG tablet Take 20 mg by mouth every Wednesday.    montelukast (SINGULAIR) 10 MG tablet Take 1 tablet (10 mg total) by mouth at bedtime.   Olopatadine HCl 0.2 % SOLN Place 1 drop into both eyes daily as needed (allergies).   Omeprazole 20 MG TBEC Take 20 mg by mouth every morning.    potassium chloride (KLOR-CON) 10 MEQ tablet Take 1 tablet (10 mEq total) by mouth daily.   potassium chloride (KLOR-CON) 10 MEQ tablet    simvastatin (ZOCOR) 40 MG tablet Take 1 tablet (40 mg total) by  mouth at bedtime.   traMADol (ULTRAM) 50 MG tablet    [DISCONTINUED] amoxicillin-clavulanate (AUGMENTIN) 875-125 MG tablet Take 1 tablet by mouth 2 (two) times daily.   [DISCONTINUED] sulfamethoxazole-trimethoprim (BACTRIM DS) 800-160 MG tablet Take 1 tablet by mouth 2 (two) times daily.   No facility-administered encounter medications on file as of 10/18/2021.    Surgical History: Past Surgical History:  Procedure Laterality Date   ABDOMINAL HYSTERECTOMY     ANTERIOR CERVICAL DECOMP/DISCECTOMY FUSION N/A 01/20/2014   Procedure: ANTERIOR CERVICAL DECOMPRESSION/DISCECTOMY FUSION 2 LEVELS cervical five/six six Tarry Kos;  Surgeon: Ophelia Charter, MD;  Location: [facility];   Service: Neurosurgery;  Laterality: N/A;   BACK SURGERY     lumbar   BREAST BIOPSY Right 02/01/2018   x shape, DCIS    BREAST BIOPSY  01/16/2019   coil, BENIGN MAMMARY TISSUE WITH MODERATE STROMAL FIBROSIS AND COARSE DYSTROPHIC CALCIFICATIONS of the RIGHT breast   BREAST LUMPECTOMY Right 03/07/2018   Procedure: BREAST LUMPECTOMY/ RE EXCISION;  Surgeon: Herbert Pun, MD;  Location: [facility];  Service: General;  Laterality: Right;   CARDIAC CATHETERIZATION     2011   COLONOSCOPY WITH PROPOFOL N/A 02/11/2021   Procedure: COLONOSCOPY WITH PROPOFOL;  Surgeon: Lucilla Lame, MD;  Location: [facility];  Service: Endoscopy;  Laterality: N/A;  Requests Early   CORONARY ANGIOPLASTY WITH STENT PLACEMENT     PLACED IN RCA 2011   EYE SURGERY     lasik   PARTIAL MASTECTOMY WITH NEEDLE LOCALIZATION Right 02/21/2018   Procedure: PARTIAL MASTECTOMY WITH NEEDLE LOCALIZATION;  Surgeon: Herbert Pun, MD;  Location: [facility];  Service: General;  Laterality: Right;   POLYPECTOMY N/A 02/11/2021   Procedure: POLYPECTOMY;  Surgeon: Lucilla Lame, MD;  Location: [facility];  Service: Endoscopy;  Laterality: N/A;   ROTATOR CUFF REPAIR     left shoulder   SHOULDER ARTHROSCOPY WITH ROTATOR CUFF REPAIR AND OPEN BICEPS TENODESIS Right 08/07/2019   Procedure: SHOULDER ARTHROSCOPY WITH DEBRIDEMENT, DECOMPRESSION, ROTATOR CUFF REPAIR AND BICEPS TENODESIS. - RNFA;  Surgeon: Corky Mull, MD;  Location: [facility];  Service: Orthopedics;  Laterality: Right;   TOE SURGERY Right    has pin and plate in it    Medical History: Past Medical History:  Diagnosis Date   Anxiety    Arthritis    Breast cancer ([place])    Cancer ([place])    breast-right    COPD (chronic obstructive pulmonary disease) ([place])    Coronary artery disease    Dyspnea    ONLY WITH EXERTION   GERD (gastroesophageal reflux disease)    "sometimes"   Headache(784.0)    sinus headaches   Myocardial infarction ([place])    in 2011   no damage-1 stent   Personal history of radiation therapy    Seizures ([place]) 2014   X1-no idea what caused her seizure   Sleep apnea    USES CPAP    Family History: Family History  Problem Relation Age of Onset   Breast cancer Cousin 21       bilateral at 78 and 42; daughter of maternal aunt who was unaffected   Lung cancer Maternal Aunt        dx 10s; deceased 1s; smoker   Heart attack Father        deceased 26   Stomach cancer Maternal Grandmother     Social History   Socioeconomic History   Marital status: Married    Spouse name: Not on file  Number of children: Not on file   Years of education: Not on file   Highest education level: Not on file  Occupational History   Not on file  Tobacco Use   Smoking status: Former    Packs/day: 1.00    Years: 30.00    Pack years: 30.00    Types: Cigarettes    Quit date: 09/13/2015    Years since quitting: 6.1   Smokeless tobacco: Never  Vaping Use   Vaping Use: Never used  Substance and Sexual Activity   Alcohol use: Yes    Comment: occasional   Drug use: No   Sexual activity: Not on file  Other Topics Concern   Not on file  Social History Narrative   Not on file   Social Determinants of Health   Financial Resource Strain: Not on file  Food Insecurity: Not on file  Transportation Needs: Not on file  Physical Activity: Not on file  Stress: Not on file  Social Connections: Not on file  Intimate Partner Violence: Not on file      Review of Systems  Constitutional:  Positive for fatigue. Negative for fever.  HENT:  Positive for congestion, ear pain, postnasal drip and sinus pressure. Negative for mouth sores.   Respiratory:  Positive for cough. Negative for shortness of breath and wheezing.   Cardiovascular:  Negative for chest pain.  Genitourinary:  Negative for flank pain.  Musculoskeletal:  Positive for myalgias.  Neurological:  Positive for headaches.  Psychiatric/Behavioral: Negative.     Vital  Signs: Temp 98.5 F (36.9 C)    Resp 16    Observation/Objective:  Pt is able to carry out conversation   Assessment/Plan: 1. Acute COVID-19 Will start zpak and prednisone and pt may continue OTC tylenol, flonase, and antihistamine. Should rest and stay well hydrated. - azithromycin (ZITHROMAX) 250 MG tablet; Take one tab a day for 10 days for uri  Dispense: 10 tablet; Refill: 0 - predniSONE (DELTASONE) 10 MG tablet; Take one tab 3 x day for 3 days, then take one tab 2 x a day for 3 days and then take one tab a day for 3 days  Dispense: 18 tablet; Refill: 0   General Counseling: Normagene verbalizes understanding of the findings of today's phone visit and agrees with plan of treatment. I have discussed any further diagnostic evaluation that may be needed or ordered today. We also reviewed her medications today. she has been encouraged to call the office with any questions or concerns that should arise related to todays visit.    No orders of the defined types were placed in this encounter.   Meds ordered this encounter  Medications   azithromycin (ZITHROMAX) 250 MG tablet    Sig: Take one tab a day for 10 days for uri    Dispense:  10 tablet    Refill:  0   predniSONE (DELTASONE) 10 MG tablet    Sig: Take one tab 3 x day for 3 days, then take one tab 2 x a day for 3 days and then take one tab a day for 3 days    Dispense:  18 tablet    Refill:  0    Time spent:25 Minutes    Dr Lavera Guise Internal medicine

## 2021-10-27 DIAGNOSIS — H524 Presbyopia: Secondary | ICD-10-CM | POA: Diagnosis not present

## 2021-10-27 DIAGNOSIS — Z0100 Encounter for examination of eyes and vision without abnormal findings: Secondary | ICD-10-CM | POA: Diagnosis not present

## 2021-11-08 ENCOUNTER — Ambulatory Visit
Admission: RE | Admit: 2021-11-08 | Discharge: 2021-11-08 | Disposition: A | Discharge status: Home / Self Care | Payer: Medicare HMO | Source: Ambulatory Visit | Attending: Physician Assistant | Admitting: Physician Assistant

## 2021-11-08 ENCOUNTER — Ambulatory Visit (INDEPENDENT_AMBULATORY_CARE_PROVIDER_SITE_OTHER): Payer: Medicare HMO

## 2021-11-08 ENCOUNTER — Other Ambulatory Visit: Payer: Self-pay

## 2021-11-08 ENCOUNTER — Telehealth: Payer: Self-pay | Admitting: Physician Assistant

## 2021-11-08 VITALS — BP 118/93 | HR 95 | Temp 98.3°F | Resp 20 | Ht 61.0 in | Wt 146.8 lb

## 2021-11-08 DIAGNOSIS — R918 Other nonspecific abnormal finding of lung field: Secondary | ICD-10-CM

## 2021-11-08 DIAGNOSIS — J449 Chronic obstructive pulmonary disease, unspecified: Secondary | ICD-10-CM

## 2021-11-08 DIAGNOSIS — R059 Cough, unspecified: Secondary | ICD-10-CM | POA: Diagnosis not present

## 2021-11-08 DIAGNOSIS — R911 Solitary pulmonary nodule: Secondary | ICD-10-CM | POA: Diagnosis not present

## 2021-11-08 DIAGNOSIS — R051 Acute cough: Secondary | ICD-10-CM

## 2021-11-08 HISTORY — DX: Other nonspecific abnormal finding of lung field: R91.8

## 2021-11-08 MED ORDER — IPRATROPIUM-ALBUTEROL 0.5-2.5 (3) MG/3ML IN SOLN
3.0000 mL | Freq: Once | RESPIRATORY_TRACT | Status: AC
  Administered 2021-11-08: 3 mL via RESPIRATORY_TRACT

## 2021-11-08 MED ORDER — DOXYCYCLINE HYCLATE 100 MG PO CAPS: 100.0000 mg | ORAL_CAPSULE | Freq: Two times a day (BID) | ORAL | 0 refills | Status: AC

## 2021-11-08 MED ORDER — ALBUTEROL SULFATE HFA 108 (90 BASE) MCG/ACT IN AERS: 1.0000 | INHALATION_SPRAY | Freq: Four times a day (QID) | RESPIRATORY_TRACT | 1 refills | Status: DC | PRN

## 2021-11-08 MED ORDER — PREDNISONE 20 MG PO TABS: 40.0000 mg | ORAL_TABLET | Freq: Every day | ORAL | 0 refills | Status: AC

## 2021-11-08 MED ORDER — CHERATUSSIN AC 100-10 MG/5ML PO SOLN: 5.0000 mL | Freq: Three times a day (TID) | ORAL | 0 refills | Status: DC | PRN

## 2021-11-08 NOTE — ED Provider Notes (Signed)
[facility]    CSN: [id number] Arrival date & time: 11/08/21  8502      History   Chief Complaint Chief Complaint  Patient presents with   Cough   Appointment    HPI Michelle Moore is a 66 y.o. female presenting for 5-day history of cough and congestion.  Patient reports that her cough is occasionally productive of yellowish sputum.  She reports right-sided rib pain and increased shortness of breath.  Has history of COPD.  Patient uses Advair daily for COPD.  Does not have an albuterol/rescue inhaler.  Patient does report a history of COVID-19 3 weeks ago.  Reports that she took azithromycin and prednisone but could not take the COVID antiviral medications due to contraindications with her statin medications.  Patient is previous smoker but no longer smokes.  Has been taking over-the-counter cough medicine but says it has not helped.  Other medical history significant for sleep apnea, right-sided breast cancer, GERD, MI in 2011.  HPI  Past Medical History:  Diagnosis Date   Anxiety    Arthritis    Breast cancer ([place])    Cancer ([place])    breast-right    COPD (chronic obstructive pulmonary disease) ([place])    Coronary artery disease    Dyspnea    ONLY WITH EXERTION   GERD (gastroesophageal reflux disease)    "sometimes"   Headache(784.0)    sinus headaches   Myocardial infarction ([place])    in 2011  no damage-1 stent   Personal history of radiation therapy    Seizures ([place]) 2014   X1-no idea what caused her seizure   Sleep apnea    USES CPAP    Patient Active Problem List   Diagnosis Date Noted   S/P cardiac catheterization 04/06/2021   Osteoporosis without current pathological fracture 04/06/2021   Aromatase inhibitor use 04/06/2021   Special screening for malignant neoplasms, colon    Family history of colon cancer    Polyp of descending colon    Cough 08/28/2020   Hypokalemia 07/25/2020   Urinary tract infection without hematuria 07/25/2020   Recurrent  displacement of lumbar disc 04/08/2020   Essential hypertension 01/22/2020   Degenerative tear of glenoid labrum of right shoulder 07/19/2019   Nontraumatic complete tear of right rotator cuff 07/19/2019   Rotator cuff tendinitis, right 07/19/2019   Tendinitis of upper biceps tendon of right shoulder 07/19/2019   Encounter for general adult medical examination with abnormal findings 05/29/2019   Dysuria 05/29/2019   Acute non-recurrent pansinusitis 06/01/2018   Perennial allergic rhinitis 06/01/2018   Generalized anxiety disorder 06/01/2018   Malignant neoplasm of right female breast ([place]) 06/01/2018   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   Coronary artery disease 11/09/2017   Mixed hyperlipidemia 11/09/2017   Bilateral carotid artery stenosis 08/14/2014   Gastroesophageal reflux disease with esophagitis 08/14/2014   OSA on CPAP 07/14/2014   Weight loss of more than 10% body weight 07/14/2014   Cervical spondylosis with radiculopathy 01/20/2014   Cervical spondylosis without myelopathy 01/20/2014    Past Surgical History:  Procedure Laterality Date   ABDOMINAL HYSTERECTOMY     ANTERIOR CERVICAL DECOMP/DISCECTOMY FUSION N/A 01/20/2014   Procedure: ANTERIOR CERVICAL DECOMPRESSION/DISCECTOMY FUSION 2 LEVELS cervical five/six six [place];  Surgeon: Ophelia Charter, MD;  Location: [facility];  Service: Neurosurgery;  Laterality: N/A;   BACK SURGERY     lumbar   BREAST BIOPSY Right 02/01/2018   x shape, DCIS  BREAST BIOPSY  01/16/2019   coil, BENIGN MAMMARY TISSUE WITH MODERATE STROMAL FIBROSIS AND COARSE DYSTROPHIC CALCIFICATIONS of the RIGHT breast   BREAST LUMPECTOMY Right 03/07/2018   Procedure: BREAST LUMPECTOMY/ RE EXCISION;  Surgeon: Herbert Pun, MD;  Location: [facility];  Service: General;  Laterality: Right;   CARDIAC CATHETERIZATION     2011   COLONOSCOPY WITH PROPOFOL N/A 02/11/2021   Procedure: COLONOSCOPY WITH PROPOFOL;  Surgeon: Lucilla Lame, MD;  Location: [place];  Service: Endoscopy;  Laterality: N/A;  Requests Early   CORONARY ANGIOPLASTY WITH STENT PLACEMENT     PLACED IN RCA 2011   EYE SURGERY     lasik   PARTIAL MASTECTOMY WITH NEEDLE LOCALIZATION Right 02/21/2018   Procedure: PARTIAL MASTECTOMY WITH NEEDLE LOCALIZATION;  Surgeon: Herbert Pun, MD;  Location: [facility];  Service: General;  Laterality: Right;   POLYPECTOMY N/A 02/11/2021   Procedure: POLYPECTOMY;  Surgeon: Lucilla Lame, MD;  Location: [place];  Service: Endoscopy;  Laterality: N/A;   ROTATOR CUFF REPAIR     left shoulder   SHOULDER ARTHROSCOPY WITH ROTATOR CUFF REPAIR AND OPEN BICEPS TENODESIS Right 08/07/2019   Procedure: SHOULDER ARTHROSCOPY WITH DEBRIDEMENT, DECOMPRESSION, ROTATOR CUFF REPAIR AND BICEPS TENODESIS. - RNFA;  Surgeon: Corky Mull, MD;  Location: [facility];  Service: Orthopedics;  Laterality: Right;   TOE SURGERY Right    has pin and plate in it    OB History   No obstetric history on file.      Home Medications    Prior to Admission medications   Medication Sig Start Date End Date Taking? Authorizing Provider  albuterol (VENTOLIN HFA) 108 (90 Base) MCG/ACT inhaler Inhale 1-2 puffs into the lungs every 6 (six) hours as needed for wheezing or shortness of breath. 11/08/21  Yes Danton Clap, PA-C  alendronate (FOSAMAX) 70 MG tablet Take 1 tablet (70 mg total) by mouth once a week. Take with a full glass of water on an empty stomach. Stand or sit upright for 1 hour after taking 09/17/21  Yes Finnegan, Kathlene November, MD  ALPRAZolam Duanne Moron) 0.25 MG tablet Take 1 tablet (0.25 mg total) by mouth 2 (two) times daily as needed for anxiety. 09/20/21  Yes McDonough, Lauren K, PA-C  anastrozole (ARIMIDEX) 1 MG tablet TAKE (1) TABLET BY MOUTH EVERY DAY 07/13/21  Yes Verlon Au, NP  buPROPion (WELLBUTRIN XL) 150 MG 24 hr tablet TAKE (1) TABLET BY MOUTH EVERY DAY 08/30/21  Yes McDonough, Lauren K, PA-C  cetirizine  (ZYRTEC) 10 MG tablet Take 10 mg by mouth daily as needed for allergies.    Yes [provider]  doxycycline (VIBRAMYCIN) 100 MG capsule Take 1 capsule (100 mg total) by mouth 2 (two) times daily for 5 days. 11/08/21 11/13/21 Yes Danton Clap, PA-C  escitalopram (LEXAPRO) 20 MG tablet Take half tab in am and one tab at night for depression and anxiety 05/25/21  Yes Lavera Guise, MD  fluticasone [facility]) 50 MCG/ACT nasal spray Place 2 sprays into both nostrils daily as needed for allergies. 09/09/19  Yes Boscia, Greer Ee, NP  fluticasone-salmeterol (ADVAIR) 250-50 MCG/ACT AEPB Inhale 1 puff into the lungs in the morning and at bedtime. 07/15/21  Yes Lavera Guise, MD  folic acid (FOLVITE) 259 MCG tablet Take 1,600 mcg by mouth daily.   Yes [provider]  guaiFENesin-codeine (CHERATUSSIN AC) 100-10 MG/5ML syrup Take 5 mLs by mouth 3 (three) times daily as needed for cough. 11/08/21  Yes Danton Clap, PA-C  methotrexate (RHEUMATREX) 2.5 MG tablet Take 20 mg by mouth every Wednesday.  10/02/17  Yes [provider]  montelukast (SINGULAIR) 10 MG tablet Take 1 tablet (10 mg total) by mouth at bedtime. 07/15/21  Yes Lavera Guise, MD  Olopatadine HCl 0.2 % SOLN Place 1 drop into both eyes daily as needed (allergies).   Yes [provider]  potassium chloride (KLOR-CON) 10 MEQ tablet Take 1 tablet (10 mEq total) by mouth daily. 06/03/21  Yes McDonough, Lauren K, PA-C  predniSONE (DELTASONE) 20 MG tablet Take 2 tablets (40 mg total) by mouth daily for 5 days. 11/08/21 11/13/21 Yes Danton Clap, PA-C  simvastatin (ZOCOR) 40 MG tablet Take 1 tablet (40 mg total) by mouth at bedtime. 06/03/21  Yes McDonough, Si Gaul, PA-C  traMADol Veatrice Bourbon) 50 MG tablet  05/21/21  Yes [provider]  fluconazole (DIFLUCAN) 150 MG tablet Take 1 tab po once and may repeat in 3 days if symptoms persist 09/24/21   McDonough, Si Gaul, PA-C  Omeprazole 20 MG TBEC Take 20 mg by mouth every  morning.     [provider]  potassium chloride (KLOR-CON) 10 MEQ tablet  05/20/21   [provider]    Family History Family History  Problem Relation Age of Onset   Breast cancer Cousin 26       bilateral at 77 and 73; daughter of maternal aunt who was unaffected   Lung cancer Maternal Aunt        dx 95s; deceased 70s; smoker   Heart attack Father        deceased 37   Stomach cancer Maternal Grandmother     Social History Social History   Tobacco Use   Smoking status: Former    Packs/day: 1.00    Years: 30.00    Pack years: 30.00    Types: Cigarettes    Quit date: 09/13/2015    Years since quitting: 6.1   Smokeless tobacco: Never  Vaping Use   Vaping Use: Never used  Substance Use Topics   Alcohol use: Yes    Comment: occasional   Drug use: No     Allergies   Other   Review of Systems Review of Systems  Constitutional:  Negative for chills, diaphoresis, fatigue and fever.  HENT:  Positive for congestion and rhinorrhea. Negative for ear pain, sinus pressure, sinus pain and sore throat.   Respiratory:  Positive for cough and shortness of breath.   Cardiovascular:  Positive for chest pain (rib pain).  Gastrointestinal:  Negative for abdominal pain, nausea and vomiting.  Musculoskeletal:  Negative for arthralgias and myalgias.  Skin:  Negative for rash.  Neurological:  Negative for weakness and headaches.  Hematological:  Negative for adenopathy.  Psychiatric/Behavioral:  Positive for sleep disturbance.     Physical Exam Triage Vital Signs ED Triage Vitals  Enc Vitals Group     BP 11/08/21 1015 (!) 118/93     Pulse Rate 11/08/21 1015 95     Resp 11/08/21 1015 20     Temp 11/08/21 1015 98.3 F (36.8 C)     Temp Source 11/08/21 1015 Oral     SpO2 11/08/21 1015 93 %     Weight 11/08/21 1011 146 lb 13.2 oz (66.6 kg)     Height 11/08/21 1011 5\' 1"  (1.549 m)     Head Circumference --      Peak Flow --  Pain Score 11/08/21 1011 8      Pain Loc --      Pain Edu? --      Excl. in [place]? --    No data found.  Updated Vital Signs BP (!) 118/93 (BP Location: Left Arm)    Pulse 95    Temp 98.3 F (36.8 C) (Oral)    Resp 20    Ht 5\' 1"  (1.549 m)    Wt 146 lb 13.2 oz (66.6 kg)    SpO2 92%    BMI 27.74 kg/m      Physical Exam Vitals and nursing note reviewed.  Constitutional:      General: She is not in acute distress.    Appearance: Normal appearance. She is not ill-appearing or toxic-appearing.  HENT:     Head: Normocephalic and atraumatic.     Nose: Congestion present.     Mouth/Throat:     Mouth: Mucous membranes are moist.     Pharynx: Oropharynx is clear.  Eyes:     General: No scleral icterus.       Right eye: No discharge.        Left eye: No discharge.     Conjunctiva/sclera: Conjunctivae normal.  Cardiovascular:     Rate and Rhythm: Normal rate and regular rhythm.     Heart sounds: Normal heart sounds.  Pulmonary:     Effort: Pulmonary effort is normal. No respiratory distress.     Breath sounds: Wheezing (RML, RLL) present. No rhonchi or rales.  Musculoskeletal:     Cervical back: Neck supple.  Skin:    General: Skin is dry.  Neurological:     General: No focal deficit present.     Mental Status: She is alert. Mental status is at baseline.     Motor: No weakness.     Gait: Gait normal.  Psychiatric:        Mood and Affect: Mood normal.        Behavior: Behavior normal.        Thought Content: Thought content normal.     UC Treatments / Results  Labs (all labs ordered are listed, but only abnormal results are displayed) Labs Reviewed - No data to display  EKG   Radiology DG Chest 2 View  Result Date: 11/08/2021 CLINICAL DATA:  Cough for 3 weeks. EXAM: CHEST - 2 VIEW COMPARISON:  02/14/2018 FINDINGS: Large lung volumes with chronic interstitial coarsening. There is no edema, consolidation, effusion, or pneumothorax. Vague nodular density over the right upper chest, likely anterior on the  lateral view, measuring 18 mm, not seen on prior. Normal heart size and mediastinal contours. These results will be called to the ordering clinician or representative by the Radiologist Assistant, and communication documented in the PACS or [employer]. IMPRESSION: 1. 18 mm nodular density on the right, recommend chest CT. 2. COPD. Electronically Signed   By: Jorje Guild M.D.   On: 11/08/2021 10:45    Procedures Procedures (including critical care time)  Medications Ordered in UC Medications  ipratropium-albuterol (DUONEB) 0.5-2.5 (3) MG/3ML nebulizer solution 3 mL (3 mLs Nebulization Given 11/08/21 1055)    Initial Impression / Assessment and Plan / UC Course  I have reviewed the triage vital signs and the nursing notes.  Pertinent labs & imaging results that were available during my care of the patient were reviewed by me and considered in my medical decision making (see chart for details).  66 year old female with history of COPD, breast  cancer, prior tobacco abuse, sleep apnea and previous MI presenting for 4 to 5-day history of cough and congestion.  Patient reports history of COVID-19 3 weeks ago.  States she had a cough for a few days but then it pretty much went away until it came back.  Associated with right chest pain on increase shortness of breath from baseline.  Patient reports baseline oxygen saturation around 93%.  It is 93% in clinic.  Patient given DuoNeb and oxygen states about the same however she is in no respiratory distress.  An x-ray of chest obtained today to assess for possible pneumonia.  X-ray reviewed by me.  X-ray overread results shows 18 mm nodular density and radiologist recommends a CT scan to further categorize.  Discussed this with patient.  Advised her of the importance of getting a CT scan.  Advised that I can order it or she can follow-up with her PCP or pulmonologist to have them order it but it is very important that she has this done in the next few  days to 1 week.  Patient does not want to have this done today.  She does state "what if I just pretend you did not say anything?"  I believe patient was making a joke, but I did advise her that it is very important she follow-up on this and have a CT scan done.  She has an appointment in April with PCP and wants to wait until then but I advised her it is important to call them sooner to have them order it.  Discussed this could be benign pulmonary nodule or with her history of breast cancer and COPD/tobacco abuse-could be malignant.  At this time treating patient's suspected COPD exacerbation with doxycycline, Cheratussin, albuterol as needed for shortness of breath and prednisone.  Patient given printed copy of x-ray results and advised to contact PCP to order CT scan ASAP.   Final Clinical Impressions(s) / UC Diagnoses   Final diagnoses:  Acute cough  Chronic obstructive pulmonary disease, unspecified COPD type ([place])  Solitary pulmonary nodule     Discharge Instructions      -Your x-ray does not show any evidence of pneumonia.  There is a finding of an 18 mm nodule on the right side.  This is new over the past couple of years and does recommend further work-up.  CT scan is advised.  You have declined to have a CT ordered by [place].  Advise you to contact your PCP or pulmonologist sooner than your appointment 1 month away.  Try to do this in the next few days.  With your history of COPD and breast cancer, CT needs to be ordered to assess for possible lung cancer.  This could also be a benign nodule. - I have sent cough medication, albuterol inhaler, antibiotics and corticosteroid for your COPD exacerbation. - Please make sure to follow-up regarding the nodule in your lungs.  Follow-up sooner than later. - Go to ER for any trouble breathing not relieved by use of your inhalers.  Also for any fever, weakness, increased chest pain or coughing up blood.     ED Prescriptions     Medication Sig  Dispense Auth. Provider   doxycycline (VIBRAMYCIN) 100 MG capsule Take 1 capsule (100 mg total) by mouth 2 (two) times daily for 5 days. 10 capsule Laurene Footman B, PA-C   guaiFENesin-codeine (CHERATUSSIN AC) 100-10 MG/5ML syrup Take 5 mLs by mouth 3 (three) times daily as needed for cough. 118 mL  Danton Clap, PA-C   albuterol (VENTOLIN HFA) 108 (90 Base) MCG/ACT inhaler Inhale 1-2 puffs into the lungs every 6 (six) hours as needed for wheezing or shortness of breath. 1 g Laurene Footman B, PA-C   predniSONE (DELTASONE) 20 MG tablet Take 2 tablets (40 mg total) by mouth daily for 5 days. 10 tablet Gretta Cool      PDMP not reviewed this encounter.   Danton Clap, PA-C 11/08/21 1234

## 2021-11-08 NOTE — Telephone Encounter (Signed)
Opal Sidles from [facility] called in to alert [place] to patients results for a Chest Xray. Pt has an 50mm nodule on the right and a chest CT is recommended. Laurene Footman, PA-C, has been alerted to patients results.

## 2021-11-08 NOTE — Discharge Instructions (Signed)
-  Your x-ray does not show any evidence of pneumonia.  There is a finding of an 18 mm nodule on the right side.  This is new over the past couple of years and does recommend further work-up.  CT scan is advised.  You have declined to have a CT ordered by [place].  Advise you to contact your PCP or pulmonologist sooner than your appointment 1 month away.  Try to do this in the next few days.  With your history of COPD and breast cancer, CT needs to be ordered to assess for possible lung cancer.  This could also be a benign nodule. - I have sent cough medication, albuterol inhaler, antibiotics and corticosteroid for your COPD exacerbation. - Please make sure to follow-up regarding the nodule in your lungs.  Follow-up sooner than later. - Go to ER for any trouble breathing not relieved by use of your inhalers.  Also for any fever, weakness, increased chest pain or coughing up blood.

## 2021-11-08 NOTE — ED Triage Notes (Signed)
Pt c/o cough, rib pain, shortness of breath. Started about 5 days ago. She states she had covid about 3 weeks ago and was left with a lingering cough but has gotten worse. She states the cough keeps her up at night.

## 2021-11-09 ENCOUNTER — Telehealth: Payer: Self-pay

## 2021-11-09 NOTE — Telephone Encounter (Signed)
Pt needs to see me

## 2021-11-09 NOTE — Telephone Encounter (Signed)
Spoke with pt an make appt next week

## 2021-11-10 ENCOUNTER — Other Ambulatory Visit: Payer: Self-pay

## 2021-11-10 DIAGNOSIS — F411 Generalized anxiety disorder: Secondary | ICD-10-CM

## 2021-11-10 MED ORDER — ESCITALOPRAM OXALATE 20 MG PO TABS: ORAL_TABLET | ORAL | 3 refills | Status: DC

## 2021-11-16 ENCOUNTER — Ambulatory Visit (INDEPENDENT_AMBULATORY_CARE_PROVIDER_SITE_OTHER): Payer: Medicare HMO | Admitting: Internal Medicine

## 2021-11-16 ENCOUNTER — Telehealth: Payer: Self-pay

## 2021-11-16 ENCOUNTER — Other Ambulatory Visit: Payer: Self-pay

## 2021-11-16 ENCOUNTER — Encounter: Payer: Self-pay | Admitting: Internal Medicine

## 2021-11-16 VITALS — BP 126/71 | HR 86 | Temp 98.6°F | Resp 16 | Ht 61.0 in | Wt 143.4 lb

## 2021-11-16 DIAGNOSIS — R1084 Generalized abdominal pain: Secondary | ICD-10-CM

## 2021-11-16 DIAGNOSIS — D3502 Benign neoplasm of left adrenal gland: Secondary | ICD-10-CM | POA: Diagnosis not present

## 2021-11-16 DIAGNOSIS — R0602 Shortness of breath: Secondary | ICD-10-CM

## 2021-11-16 DIAGNOSIS — R911 Solitary pulmonary nodule: Secondary | ICD-10-CM | POA: Diagnosis not present

## 2021-11-16 NOTE — Progress Notes (Signed)
[facility] [place], [place] [address]  Internal MEDICINE  Office Visit Note  Patient Name: Michelle Moore  [id number]  [id number]  Date of Service: 11/22/2021  Chief Complaint  Patient presents with   Follow-up    Follow up for chest x-ray    HPI Patient is here for follow-up on her chest x-ray, she developed acute bronchitis with cough and shortness of breath and had to go to ED, she had a chest x-ray which showed the following findings patient has been a smoker in the past quit 3 years ago she is high risk for lung cancer.  She does complain of short shortness of breath upon exertion Patient also has history of CAD status post angioplasty, history of breast cancer followed by oncology 1. 18 mm nodular density on the right, recommend chest CT. 2. COPD. Patient other problem is ongoing lower abdominal pain she does have history of adrenal adenoma which will need further follow-up Current Medication: Outpatient Encounter Medications as of 11/16/2021  Medication Sig   albuterol (VENTOLIN HFA) 108 (90 Base) MCG/ACT inhaler Inhale 1-2 puffs into the lungs every 6 (six) hours as needed for wheezing or shortness of breath.   alendronate (FOSAMAX) 70 MG tablet Take 1 tablet (70 mg total) by mouth once a week. Take with a full glass of water on an empty stomach. Stand or sit upright for 1 hour after taking   ALPRAZolam (XANAX) 0.25 MG tablet Take 1 tablet (0.25 mg total) by mouth 2 (two) times daily as needed for anxiety.   anastrozole (ARIMIDEX) 1 MG tablet TAKE (1) TABLET BY MOUTH EVERY DAY   buPROPion (WELLBUTRIN XL) 150 MG 24 hr tablet TAKE (1) TABLET BY MOUTH EVERY DAY   cetirizine (ZYRTEC) 10 MG tablet Take 10 mg by mouth daily as needed for allergies.    escitalopram (LEXAPRO) 20 MG tablet Take half tab in am and one tab at night for depression and anxiety   fluticasone (FLONASE) 50 MCG/ACT nasal spray Place 2 sprays into both nostrils daily as needed for  allergies.   fluticasone-salmeterol (ADVAIR) 250-50 MCG/ACT AEPB Inhale 1 puff into the lungs in the morning and at bedtime.   folic acid (FOLVITE) 283 MCG tablet Take 1,600 mcg by mouth daily.   guaiFENesin-codeine (CHERATUSSIN AC) 100-10 MG/5ML syrup Take 5 mLs by mouth 3 (three) times daily as needed for cough.   methotrexate (RHEUMATREX) 2.5 MG tablet Take 20 mg by mouth every Wednesday.    montelukast (SINGULAIR) 10 MG tablet Take 1 tablet (10 mg total) by mouth at bedtime.   Olopatadine HCl 0.2 % SOLN Place 1 drop into both eyes daily as needed (allergies).   Omeprazole 20 MG TBEC Take 20 mg by mouth every morning.    potassium chloride (KLOR-CON) 10 MEQ tablet Take 1 tablet (10 mEq total) by mouth daily.   potassium chloride (KLOR-CON) 10 MEQ tablet    simvastatin (ZOCOR) 40 MG tablet Take 1 tablet (40 mg total) by mouth at bedtime.   traMADol (ULTRAM) 50 MG tablet    [DISCONTINUED] fluconazole (DIFLUCAN) 150 MG tablet Take 1 tab po once and may repeat in 3 days if symptoms persist (Patient not taking: Reported on 11/16/2021)   No facility-administered encounter medications on file as of 11/16/2021.    Surgical History: Past Surgical History:  Procedure Laterality Date   ABDOMINAL HYSTERECTOMY     ANTERIOR CERVICAL DECOMP/DISCECTOMY FUSION N/A 01/20/2014   Procedure: ANTERIOR CERVICAL DECOMPRESSION/DISCECTOMY FUSION 2 LEVELS  cervical five/six six Tarry Kos;  Surgeon: Ophelia Charter, MD;  Location: [place] [facility];  Service: Neurosurgery;  Laterality: N/A;   BACK SURGERY     lumbar   BREAST BIOPSY Right 02/01/2018   x shape, DCIS    BREAST BIOPSY  01/16/2019   coil, BENIGN MAMMARY TISSUE WITH MODERATE STROMAL FIBROSIS AND COARSE DYSTROPHIC CALCIFICATIONS of the RIGHT breast   BREAST LUMPECTOMY Right 03/07/2018   Procedure: BREAST LUMPECTOMY/ RE EXCISION;  Surgeon: Herbert Pun, MD;  Location: [facility];  Service: General;  Laterality: Right;   CARDIAC CATHETERIZATION     2011    COLONOSCOPY WITH PROPOFOL N/A 02/11/2021   Procedure: COLONOSCOPY WITH PROPOFOL;  Surgeon: Lucilla Lame, MD;  Location: [facility];  Service: Endoscopy;  Laterality: N/A;  Requests Early   CORONARY ANGIOPLASTY WITH STENT PLACEMENT     PLACED IN RCA 2011   EYE SURGERY     lasik   PARTIAL MASTECTOMY WITH NEEDLE LOCALIZATION Right 02/21/2018   Procedure: PARTIAL MASTECTOMY WITH NEEDLE LOCALIZATION;  Surgeon: Herbert Pun, MD;  Location: [facility];  Service: General;  Laterality: Right;   POLYPECTOMY N/A 02/11/2021   Procedure: POLYPECTOMY;  Surgeon: Lucilla Lame, MD;  Location: [place];  Service: Endoscopy;  Laterality: N/A;   ROTATOR CUFF REPAIR     left shoulder   SHOULDER ARTHROSCOPY WITH ROTATOR CUFF REPAIR AND OPEN BICEPS TENODESIS Right 08/07/2019   Procedure: SHOULDER ARTHROSCOPY WITH DEBRIDEMENT, DECOMPRESSION, ROTATOR CUFF REPAIR AND BICEPS TENODESIS. - RNFA;  Surgeon: Corky Mull, MD;  Location: [facility];  Service: Orthopedics;  Laterality: Right;   TOE SURGERY Right    has pin and plate in it    Medical History: Past Medical History:  Diagnosis Date   Anxiety    Arthritis    Breast cancer ([place])    Cancer ([place])    breast-right    COPD (chronic obstructive pulmonary disease) ([place])    Coronary artery disease    Dyspnea    ONLY WITH EXERTION   GERD (gastroesophageal reflux disease)    "sometimes"   Headache(784.0)    sinus headaches   Myocardial infarction ([place])    in 2011  no damage-1 stent   Personal history of radiation therapy    Seizures ([place]) 2014   X1-no idea what caused her seizure   Sleep apnea    USES CPAP    Family History: Family History  Problem Relation Age of Onset   Breast cancer Cousin 63       bilateral at 41 and 74; daughter of maternal aunt who was unaffected   Lung cancer Maternal Aunt        dx 53s; deceased 45s; smoker   Heart attack Father        deceased 65   Stomach cancer Maternal Grandmother     Social  History   Socioeconomic History   Marital status: Married    Spouse name: Not on file   Number of children: Not on file   Years of education: Not on file   Highest education level: Not on file  Occupational History   Not on file  Tobacco Use   Smoking status: Former    Packs/day: 1.00    Years: 30.00    Pack years: 30.00    Types: Cigarettes    Quit date: 09/13/2015    Years since quitting: 6.1   Smokeless tobacco: Never  Vaping Use   Vaping Use: Never used  Substance and Sexual Activity  Alcohol use: Yes    Comment: occasional   Drug use: No   Sexual activity: Not on file  Other Topics Concern   Not on file  Social History Narrative   Not on file   Social Determinants of Health   Financial Resource Strain: Not on file  Food Insecurity: Not on file  Transportation Needs: Not on file  Physical Activity: Not on file  Stress: Not on file  Social Connections: Not on file  Intimate Partner Violence: Not on file      Review of Systems  Constitutional:  Negative for chills, fatigue and unexpected weight change.  HENT:  Positive for postnasal drip. Negative for congestion, rhinorrhea, sneezing and sore throat.   Eyes:  Negative for redness.  Respiratory:  Positive for chest tightness, shortness of breath and wheezing. Negative for cough.   Cardiovascular:  Negative for chest pain and palpitations.  Gastrointestinal:  Negative for abdominal pain, constipation, diarrhea, nausea and vomiting.  Genitourinary:  Negative for dysuria and frequency.  Musculoskeletal:  Negative for arthralgias, back pain, joint swelling and neck pain.  Skin:  Negative for rash.  Neurological: Negative.  Negative for tremors and numbness.  Hematological:  Negative for adenopathy. Does not bruise/bleed easily.  Psychiatric/Behavioral:  Negative for behavioral problems (Depression), sleep disturbance and suicidal ideas. The patient is not nervous/anxious.    Vital Signs: BP 126/71    Pulse 86     Temp 98.6 F (37 C)    Resp 16    Ht 5\' 1"  (1.549 m)    Wt 143 lb 6.4 oz (65 kg)    SpO2 95%    BMI 27.10 kg/m    Physical Exam Constitutional:      Appearance: Normal appearance.  HENT:     Head: Normocephalic and atraumatic.     Nose: Nose normal.     Mouth/Throat:     Mouth: Mucous membranes are moist.     Pharynx: No posterior oropharyngeal erythema.  Eyes:     Extraocular Movements: Extraocular movements intact.     Pupils: Pupils are equal, round, and reactive to light.  Cardiovascular:     Pulses: Normal pulses.     Heart sounds: Normal heart sounds.  Pulmonary:     Effort: Pulmonary effort is normal.     Breath sounds: Wheezing and rhonchi present.  Neurological:     General: No focal deficit present.     Mental Status: She is alert.  Psychiatric:        Mood and Affect: Mood normal.        Behavior: Behavior normal.       Assessment/Plan: 1. Shortness of breath Patient did desaturate to 88% with ambulation however she did bounce back.  We will continue to monitor, patient is on Advair we will continue that and add Spiriva samples were given to the patient - Spirometry with Graph - CT CHEST NODULE FOLLOW UP WO CONTRAST; Future - 6 minute walk  2. Solitary lung nodule Patient needs to have a follow-up CT nodule and will need to see pulmonary if needed - CT CHEST NODULE FOLLOW UP WO CONTRAST; Future  3. Generalized abdominal pain Patient does have generalized abdominal pain has history of abnormal adenoma will need follow-up - CT Abdomen Pelvis W Contrast; Future  4. Adrenal adenoma, left Follow-up ordered - CT Abdomen Pelvis W Contrast; Future   General Counseling: Pamula verbalizes understanding of the findings of todays visit and agrees with plan of treatment. I have  discussed any further diagnostic evaluation that may be needed or ordered today. We also reviewed her medications today. she has been encouraged to call the office with any questions or  concerns that should arise related to todays visit.    Orders Placed This Encounter  Procedures   CT CHEST NODULE FOLLOW UP WO CONTRAST   CT Abdomen Pelvis W Contrast   Spirometry with Graph   6 minute walk    No orders of the defined types were placed in this encounter.   Total time spent:30 Minutes Time spent includes review of chart, medications, test results, and follow up plan with the patient.   [place] Controlled Substance Database was reviewed by me.   Dr Lavera Guise Internal medicine

## 2021-11-16 NOTE — Telephone Encounter (Signed)
Routine chest and abdomen CT ordered. Printed. Gave to Titania-Toni ?

## 2021-11-19 DIAGNOSIS — M48062 Spinal stenosis, lumbar region with neurogenic claudication: Secondary | ICD-10-CM | POA: Diagnosis not present

## 2021-11-19 DIAGNOSIS — M5136 Other intervertebral disc degeneration, lumbar region: Secondary | ICD-10-CM | POA: Diagnosis not present

## 2021-11-19 DIAGNOSIS — M5416 Radiculopathy, lumbar region: Secondary | ICD-10-CM | POA: Diagnosis not present

## 2021-11-22 ENCOUNTER — Telehealth: Payer: Self-pay

## 2021-11-22 NOTE — Telephone Encounter (Signed)
Patient has been advised that she's scheduled for a ct chest & ct abdomen on 12/07/21 @ 10:00 [facility]/ tat ? ?*pt will need to pick up prep kit* ?

## 2021-11-23 ENCOUNTER — Encounter: Payer: Self-pay | Admitting: Internal Medicine

## 2021-11-30 DIAGNOSIS — M65342 Trigger finger, left ring finger: Secondary | ICD-10-CM | POA: Diagnosis not present

## 2021-11-30 DIAGNOSIS — M0579 Rheumatoid arthritis with rheumatoid factor of multiple sites without organ or systems involvement: Secondary | ICD-10-CM | POA: Diagnosis not present

## 2021-12-01 ENCOUNTER — Telehealth: Payer: Self-pay

## 2021-12-01 ENCOUNTER — Other Ambulatory Visit: Payer: Self-pay

## 2021-12-01 MED ORDER — AMOXICILLIN-POT CLAVULANATE 875-125 MG PO TABS: 1.0000 | ORAL_TABLET | Freq: Two times a day (BID) | ORAL | 0 refills | Status: DC

## 2021-12-01 NOTE — Telephone Encounter (Signed)
Pt called that she is having head congestion,ear pain and right side sore throat as per dr Humphrey Rolls send Augmentin for 10 days and advised her to take with food  ?

## 2021-12-07 ENCOUNTER — Other Ambulatory Visit: Payer: Self-pay

## 2021-12-07 ENCOUNTER — Ambulatory Visit
Admission: RE | Admit: 2021-12-07 | Discharge: 2021-12-07 | Disposition: A | Discharge status: Home / Self Care | Payer: Medicare HMO | Source: Ambulatory Visit | Attending: Internal Medicine | Admitting: Internal Medicine

## 2021-12-07 ENCOUNTER — Ambulatory Visit: Payer: Medicare HMO

## 2021-12-07 DIAGNOSIS — R911 Solitary pulmonary nodule: Secondary | ICD-10-CM | POA: Diagnosis not present

## 2021-12-07 DIAGNOSIS — E278 Other specified disorders of adrenal gland: Secondary | ICD-10-CM | POA: Diagnosis not present

## 2021-12-07 DIAGNOSIS — D3502 Benign neoplasm of left adrenal gland: Secondary | ICD-10-CM | POA: Diagnosis not present

## 2021-12-07 DIAGNOSIS — R0602 Shortness of breath: Secondary | ICD-10-CM | POA: Insufficient documentation

## 2021-12-07 DIAGNOSIS — N3289 Other specified disorders of bladder: Secondary | ICD-10-CM

## 2021-12-07 DIAGNOSIS — R1084 Generalized abdominal pain: Secondary | ICD-10-CM | POA: Insufficient documentation

## 2021-12-07 DIAGNOSIS — E041 Nontoxic single thyroid nodule: Secondary | ICD-10-CM

## 2021-12-07 DIAGNOSIS — D3501 Benign neoplasm of right adrenal gland: Secondary | ICD-10-CM

## 2021-12-07 DIAGNOSIS — K760 Fatty (change of) liver, not elsewhere classified: Secondary | ICD-10-CM | POA: Diagnosis not present

## 2021-12-07 HISTORY — DX: Benign neoplasm of right adrenal gland: D35.01

## 2021-12-07 HISTORY — DX: Other specified disorders of bladder: N32.89

## 2021-12-07 HISTORY — DX: Nontoxic single thyroid nodule: E04.1

## 2021-12-07 MED ORDER — IOHEXOL 300 MG/ML  SOLN
100.0000 mL | Freq: Once | INTRAMUSCULAR | Status: AC | PRN
  Administered 2021-12-07: 100 mL via INTRAVENOUS

## 2021-12-08 ENCOUNTER — Telehealth: Payer: Self-pay

## 2021-12-08 ENCOUNTER — Other Ambulatory Visit: Payer: Self-pay

## 2021-12-08 MED ORDER — FLUTICASONE-SALMETEROL 250-50 MCG/ACT IN AEPB: 1.0000 | INHALATION_SPRAY | Freq: Two times a day (BID) | RESPIRATORY_TRACT | 1 refills | Status: DC

## 2021-12-08 NOTE — Telephone Encounter (Signed)
Spoke with pt that we make her appt next Monday and Tuesday to discuss CT result  ?

## 2021-12-08 NOTE — Telephone Encounter (Signed)
We have her reports, needs to keep her app

## 2021-12-13 ENCOUNTER — Encounter: Payer: Self-pay | Admitting: Internal Medicine

## 2021-12-13 ENCOUNTER — Ambulatory Visit: Payer: Medicare HMO | Admitting: Nurse Practitioner

## 2021-12-13 ENCOUNTER — Ambulatory Visit: Payer: Medicare HMO | Admitting: Internal Medicine

## 2021-12-13 ENCOUNTER — Other Ambulatory Visit: Payer: Self-pay

## 2021-12-13 ENCOUNTER — Telehealth: Payer: Self-pay

## 2021-12-13 VITALS — BP 130/80 | HR 77 | Temp 98.2°F | Resp 16 | Ht 60.0 in | Wt 146.0 lb

## 2021-12-13 DIAGNOSIS — U099 Post covid-19 condition, unspecified: Secondary | ICD-10-CM | POA: Diagnosis not present

## 2021-12-13 DIAGNOSIS — J449 Chronic obstructive pulmonary disease, unspecified: Secondary | ICD-10-CM | POA: Diagnosis not present

## 2021-12-13 DIAGNOSIS — R0602 Shortness of breath: Secondary | ICD-10-CM

## 2021-12-13 DIAGNOSIS — R911 Solitary pulmonary nodule: Secondary | ICD-10-CM

## 2021-12-13 NOTE — Telephone Encounter (Signed)
PET ordered. Printed. Gave to Titania-Toni ?

## 2021-12-13 NOTE — Patient Instructions (Signed)
Sleep Apnea ?Sleep apnea affects breathing during sleep. It causes breathing to stop for 10 seconds or more, or to become shallow. People with sleep apnea usually snore loudly. ?It can also increase the risk of: ?Heart attack. ?Stroke. ?Being very overweight (obese). ?Diabetes. ?Heart failure. ?Irregular heartbeat. ?High blood pressure. ?The goal of treatment is to help you breathe normally again. ?What are the causes? ?The most common cause of this condition is a collapsed or blocked airway. ?There are three kinds of sleep apnea: ?Obstructive sleep apnea. This is caused by a blocked or collapsed airway. ?Central sleep apnea. This happens when the brain does not send the right signals to the muscles that control breathing. ?Mixed sleep apnea. This is a combination of obstructive and central sleep apnea. ?What increases the risk? ?Being overweight. ?Smoking. ?Having a small airway. ?Being older. ?Being female. ?Drinking alcohol. ?Taking medicines to calm yourself (sedatives or tranquilizers). ?Having family members with the condition. ?Having a tongue or tonsils that are larger than normal. ?What are the signs or symptoms? ?Trouble staying asleep. ?Loud snoring. ?Headaches in the morning. ?Waking up gasping. ?Dry mouth or sore throat in the morning. ?Being sleepy or tired during the day. ?If you are sleepy or tired during the day, you may also: ?Not be able to focus your mind (concentrate). ?Forget things. ?Get angry a lot and have mood swings. ?Feel sad (depressed). ?Have changes in your personality. ?Have less interest in sex, if you are female. ?Be unable to have an erection, if you are female. ?How is this treated? ? ?Sleeping on your side. ?Using a medicine to get rid of mucus in your nose (decongestant). ?Avoiding the use of alcohol, medicines to help you relax, or certain pain medicines (narcotics). ?Losing weight, if needed. ?Changing your diet. ?Quitting smoking. ?Using a machine to open your airway while you  sleep, such as: ?An oral appliance. This is a mouthpiece that shifts your lower jaw forward. ?A CPAP device. This device blows air through a mask when you breathe out (exhale). ?An EPAP device. This has valves that you put in each nostril. ?A BIPAP device. This device blows air through a mask when you breathe in (inhale) and breathe out. ?Having surgery if other treatments do not work. ?Follow these instructions at home: ?Lifestyle ?Make changes that your doctor recommends. ?Eat a healthy diet. ?Lose weight if needed. ?Avoid alcohol, medicines to help you relax, and some pain medicines. ?Do not smoke or use any products that contain nicotine or tobacco. If you need help quitting, ask your doctor. ?General instructions ?Take over-the-counter and prescription medicines only as told by your doctor. ?If you were given a machine to use while you sleep, use it only as told by your doctor. ?If you are having surgery, make sure to tell your doctor you have sleep apnea. You may need to bring your device with you. ?Keep all follow-up visits. ?Contact a doctor if: ?The machine that you were given to use during sleep bothers you or does not seem to be working. ?You do not get better. ?You get worse. ?Get help right away if: ?Your chest hurts. ?You have trouble breathing in enough air. ?You have an uncomfortable feeling in your back, arms, or stomach. ?You have trouble talking. ?One side of your body feels weak. ?A part of your face is hanging down. ?These symptoms may be an emergency. Get help right away. Call your local emergency services (911 in the [place] ?Do not wait to see if the symptoms   will go away. ?Do not drive yourself to the hospital. ?Summary ?This condition affects breathing during sleep. ?The most common cause is a collapsed or blocked airway. ?The goal of treatment is to help you breathe normally while you sleep. ?This information is not intended to replace advice given to you by your health care provider. Make  sure you discuss any questions you have with your health care provider. ?Document Revised: 04/07/2021 Document Reviewed: 08/07/2020 ?Elsevier Patient Education ? 2022 Elsevier Inc. ? ?

## 2021-12-13 NOTE — Progress Notes (Signed)
[facility] ?[address] ?[place], [place] [address] ? ?Pulmonary Sleep Medicine  ? ?Office Visit Note ? ?Patient Name: Michelle Moore ?DOB: 1955/12/09 ?MRN [id number] ? ?Date of Service: 12/13/2021 ? ?Complaints/HPI: Severe COPD. Pulmonary nodule. She had a CT chest done in March this showed presence of a spiculated nodule.  Patient has a history of cigarette smoking and she is at risk for development of malignancy.  There is a collection of fluid also noted in the right lateral breast this should be worked up with her primary care physician.  For the nodule at this time the measurements are 1.9 x 1.4 x 2.5 cm.  The concern is that the nodule is irregular and explained to her that this is of concern for potential of malignancy.  In this setting with her history it would be reasonable to proceed for a PET scan to further assess the nature of the nodule.  I am going to get this scheduled for her and then she will follow-up with me afterwards to discuss the findings. ? ?ROS ? ?General: (-) fever, (-) chills, (-) night sweats, (-) weakness ?Skin: (-) rashes, (-) itching,. ?Eyes: (-) visual changes, (-) redness, (-) itching. ?Nose and Sinuses: (-) nasal stuffiness or itchiness, (-) postnasal drip, (-) nosebleeds, (-) sinus trouble. ?Mouth and Throat: (-) sore throat, (-) hoarseness. ?Neck: (-) swollen glands, (-) enlarged thyroid, (-) neck pain. ?Respiratory: - cough, (-) bloody sputum, + shortness of breath, - wheezing. ?Cardiovascular: - ankle swelling, (-) chest pain. ?Lymphatic: (-) lymph node enlargement. ?Neurologic: (-) numbness, (-) tingling. ?Psychiatric: (-) anxiety, (-) depression ? ? ?Current Medication: ?Outpatient Encounter Medications as of 12/13/2021  ?Medication Sig  ? albuterol (VENTOLIN HFA) 108 (90 Base) MCG/ACT inhaler Inhale 1-2 puffs into the lungs every 6 (six) hours as needed for wheezing or shortness of breath.  ? alendronate (FOSAMAX) 70 MG tablet Take 1 tablet (70 mg total) by mouth  once a week. Take with a full glass of water on an empty stomach. Stand or sit upright for 1 hour after taking  ? ALPRAZolam (XANAX) 0.25 MG tablet Take 1 tablet (0.25 mg total) by mouth 2 (two) times daily as needed for anxiety.  ? amoxicillin-clavulanate (AUGMENTIN) 875-125 MG tablet Take 1 tablet by mouth 2 (two) times daily.  ? anastrozole (ARIMIDEX) 1 MG tablet TAKE (1) TABLET BY MOUTH EVERY DAY  ? buPROPion (WELLBUTRIN XL) 150 MG 24 hr tablet TAKE (1) TABLET BY MOUTH EVERY DAY  ? cetirizine (ZYRTEC) 10 MG tablet Take 10 mg by mouth daily as needed for allergies.   ? escitalopram (LEXAPRO) 20 MG tablet Take half tab in am and one tab at night for depression and anxiety  ? fluticasone (FLONASE) 50 MCG/ACT nasal spray Place 2 sprays into both nostrils daily as needed for allergies.  ? fluticasone-salmeterol (ADVAIR) 250-50 MCG/ACT AEPB Inhale 1 puff into the lungs in the morning and at bedtime.  ? folic acid (FOLVITE) 102 MCG tablet Take 1,600 mcg by mouth daily.  ? guaiFENesin-codeine (CHERATUSSIN AC) 100-10 MG/5ML syrup Take 5 mLs by mouth 3 (three) times daily as needed for cough.  ? methotrexate (RHEUMATREX) 2.5 MG tablet Take 20 mg by mouth every Wednesday.   ? montelukast (SINGULAIR) 10 MG tablet Take 1 tablet (10 mg total) by mouth at bedtime.  ? Olopatadine HCl 0.2 % SOLN Place 1 drop into both eyes daily as needed (allergies).  ? Omeprazole 20 MG TBEC Take 20 mg by mouth every morning.   ?  potassium chloride (KLOR-CON) 10 MEQ tablet Take 1 tablet (10 mEq total) by mouth daily.  ? potassium chloride (KLOR-CON) 10 MEQ tablet   ? simvastatin (ZOCOR) 40 MG tablet Take 1 tablet (40 mg total) by mouth at bedtime.  ? traMADol (ULTRAM) 50 MG tablet   ? ?No facility-administered encounter medications on file as of 12/13/2021.  ? ? ?Surgical History: ?Past Surgical History:  ?Procedure Laterality Date  ? ABDOMINAL HYSTERECTOMY    ? ANTERIOR CERVICAL DECOMP/DISCECTOMY FUSION N/A 01/20/2014  ? Procedure: ANTERIOR  CERVICAL DECOMPRESSION/DISCECTOMY FUSION 2 LEVELS cervical five/six six Tarry Kos;  Surgeon: Ophelia Charter, MD;  Location: [facility];  Service: Neurosurgery;  Laterality: N/A;  ? BACK SURGERY    ? lumbar  ? BREAST BIOPSY Right 02/01/2018  ? x shape, DCIS   ? BREAST BIOPSY  01/16/2019  ? coil, BENIGN MAMMARY TISSUE WITH MODERATE STROMAL FIBROSIS AND COARSE DYSTROPHIC CALCIFICATIONS of the RIGHT breast  ? BREAST LUMPECTOMY Right 03/07/2018  ? Procedure: BREAST LUMPECTOMY/ RE EXCISION;  Surgeon: Herbert Pun, MD;  Location: [facility];  Service: General;  Laterality: Right;  ? CARDIAC CATHETERIZATION    ? 2011  ? COLONOSCOPY WITH PROPOFOL N/A 02/11/2021  ? Procedure: COLONOSCOPY WITH PROPOFOL;  Surgeon: Lucilla Lame, MD;  Location: [facility];  Service: Endoscopy;  Laterality: N/A;  Requests Early  ? CORONARY ANGIOPLASTY WITH STENT PLACEMENT    ? PLACED IN RCA 2011  ? EYE SURGERY    ? lasik  ? PARTIAL MASTECTOMY WITH NEEDLE LOCALIZATION Right 02/21/2018  ? Procedure: PARTIAL MASTECTOMY WITH NEEDLE LOCALIZATION;  Surgeon: Herbert Pun, MD;  Location: [facility];  Service: General;  Laterality: Right;  ? POLYPECTOMY N/A 02/11/2021  ? Procedure: POLYPECTOMY;  Surgeon: Lucilla Lame, MD;  Location: [facility];  Service: Endoscopy;  Laterality: N/A;  ? ROTATOR CUFF REPAIR    ? left shoulder  ? SHOULDER ARTHROSCOPY WITH ROTATOR CUFF REPAIR AND OPEN BICEPS TENODESIS Right 08/07/2019  ? Procedure: SHOULDER ARTHROSCOPY WITH DEBRIDEMENT, DECOMPRESSION, ROTATOR CUFF REPAIR AND BICEPS TENODESIS. - RNFA;  Surgeon: Corky Mull, MD;  Location: [facility];  Service: Orthopedics;  Laterality: Right;  ? TOE SURGERY Right   ? has pin and plate in it  ? ? ?Medical History: ?Past Medical History:  ?Diagnosis Date  ? Anxiety   ? Arthritis   ? Breast cancer ([place])   ? Cancer [facility])   ? breast-right   ? COPD (chronic obstructive pulmonary disease) ([place])   ? Coronary artery disease   ? Dyspnea   ? ONLY WITH EXERTION   ? GERD (gastroesophageal reflux disease)   ? "sometimes"  ? Headache(784.0)   ? sinus headaches  ? Myocardial infarction [facility])   ? in 2011  no damage-1 stent  ? Personal history of radiation therapy   ? Seizures ([place]) 2014  ? X1-no idea what caused her seizure  ? Sleep apnea   ? USES CPAP  ? ? ?Family History: ?Family History  ?Problem Relation Age of Onset  ? Breast cancer Cousin 26  ?     bilateral at 52 and 24; daughter of maternal aunt who was unaffected  ? Lung cancer Maternal Aunt   ?     dx 23s; deceased 38s; smoker  ? Heart attack Father   ?     deceased 49  ? Stomach cancer Maternal Grandmother   ? ? ?Social History: ?Social History  ? ?Socioeconomic History  ? Marital status: Married  ?  Spouse  name: Not on file  ? Number of children: Not on file  ? Years of education: Not on file  ? Highest education level: Not on file  ?Occupational History  ? Not on file  ?Tobacco Use  ? Smoking status: Former  ?  Packs/day: 1.00  ?  Years: 30.00  ?  Pack years: 30.00  ?  Types: Cigarettes  ?  Quit date: 09/13/2015  ?  Years since quitting: 6.2  ? Smokeless tobacco: Never  ?Vaping Use  ? Vaping Use: Never used  ?Substance and Sexual Activity  ? Alcohol use: Yes  ?  Comment: occasional  ? Drug use: No  ? Sexual activity: Not on file  ?Other Topics Concern  ? Not on file  ?Social History Narrative  ? Not on file  ? ?Social Determinants of Health  ? ?Financial Resource Strain: Not on file  ?Food Insecurity: Not on file  ?Transportation Needs: Not on file  ?Physical Activity: Not on file  ?Stress: Not on file  ?Social Connections: Not on file  ?Intimate Partner Violence: Not on file  ? ? ?Vital Signs: ?Blood pressure 130/80, pulse 77, temperature 98.2 ?F (36.8 ?C), resp. rate 16, height 5' (1.524 m), weight 146 lb (66.2 kg), SpO2 95 %. ? ?Examination: ?General Appearance: The patient is well-developed, well-nourished, and in no distress. ?Skin: Gross inspection of skin unremarkable. ?Head: normocephalic, no gross  deformities. ?Eyes: no gross deformities noted. ?ENT: ears appear grossly normal no exudates. ?Neck: Supple. No thyromegaly. No LAD. ?Respiratory: no rhonchi noted at this time. ?Cardiovascular: Normal S1 and S2 with

## 2021-12-14 ENCOUNTER — Ambulatory Visit (INDEPENDENT_AMBULATORY_CARE_PROVIDER_SITE_OTHER): Payer: Medicare HMO | Admitting: Nurse Practitioner

## 2021-12-14 ENCOUNTER — Encounter: Payer: Self-pay | Admitting: Nurse Practitioner

## 2021-12-14 VITALS — BP 130/80 | HR 81 | Temp 98.0°F | Resp 16 | Ht 60.0 in | Wt 145.6 lb

## 2021-12-14 DIAGNOSIS — F320 Major depressive disorder, single episode, mild: Secondary | ICD-10-CM | POA: Diagnosis not present

## 2021-12-14 DIAGNOSIS — I10 Essential (primary) hypertension: Secondary | ICD-10-CM | POA: Diagnosis not present

## 2021-12-14 DIAGNOSIS — F411 Generalized anxiety disorder: Secondary | ICD-10-CM | POA: Diagnosis not present

## 2021-12-14 DIAGNOSIS — N368 Other specified disorders of urethra: Secondary | ICD-10-CM

## 2021-12-14 MED ORDER — SERTRALINE HCL 25 MG PO TABS: 25.0000 mg | ORAL_TABLET | Freq: Every day | ORAL | 3 refills | Status: DC

## 2021-12-14 MED ORDER — ALPRAZOLAM 0.25 MG PO TABS: 0.2500 mg | ORAL_TABLET | Freq: Two times a day (BID) | ORAL | 1 refills | Status: DC | PRN

## 2021-12-14 MED ORDER — SERTRALINE HCL 50 MG PO TABS: ORAL_TABLET | ORAL | 3 refills | Status: DC

## 2021-12-14 NOTE — Progress Notes (Signed)
[facility] ?[address] ?[place],  [address] ? ?Internal MEDICINE  ?Office Visit Note ? ?Patient Name: Michelle Moore ? [id number]  ?[id number] ? ?Date of Service: 12/14/2021 ? ?Chief Complaint  ?Patient presents with  ? Follow-up  ?  CT scan   ? Medication Refill  ?  Like to discuss Escitalopram she got letter from her insurance its  not covered   ? ? ?HPI ?[name] presents for follow-up visit to discuss her CT scan of abdomen and pelvis and her escitalopram medication.  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  She is waiting to get a PET scan per Dr. Devona Konig. ?She received a letter from her insurance stating that they will no longer cover escitalopram and she needs to have a prior authorization done or to be tried on a different medication.  She reports that she does not feel like the citalopram is really helping but that she does need something for anxiety and depression. ? ? ? ? ?Current Medication: ?Outpatient Encounter Medications as of 12/14/2021  ?Medication Sig  ? albuterol (VENTOLIN HFA) 108 (90 Base) MCG/ACT inhaler Inhale 1-2 puffs into the lungs every 6 (six) hours as needed for wheezing or shortness of breath.  ? alendronate (FOSAMAX) 70 MG tablet Take 1 tablet (70 mg total) by mouth once a week. Take with a full glass of water on an empty stomach. Stand or sit upright for 1 hour after taking  ? anastrozole (ARIMIDEX) 1 MG tablet TAKE (1) TABLET BY MOUTH EVERY DAY  ? buPROPion (WELLBUTRIN XL) 150 MG 24 hr tablet TAKE (1) TABLET BY MOUTH EVERY DAY  ? cetirizine (ZYRTEC) 10 MG tablet Take 10 mg by mouth daily as needed for allergies.   ? fluticasone (FLONASE) 50 MCG/ACT nasal spray Place 2 sprays into both nostrils daily as needed for allergies.  ? fluticasone-salmeterol (ADVAIR) 250-50 MCG/ACT AEPB Inhale 1 puff into the lungs in the morning and at bedtime.  ? folic acid (FOLVITE) 809 MCG tablet Take 1,600 mcg by mouth daily.  ?  methotrexate (RHEUMATREX) 2.5 MG tablet Take 20 mg by mouth every Wednesday.   ? montelukast (SINGULAIR) 10 MG tablet Take 1 tablet (10 mg total) by mouth at bedtime.  ? Omeprazole 20 MG TBEC Take 20 mg by mouth every morning.   ? potassium chloride (KLOR-CON) 10 MEQ tablet Take 1 tablet (10 mEq total) by mouth daily.  ? sertraline (ZOLOFT) 50 MG tablet Take 1 tablet for 1 week, and then increase to 1.5 tablets daily  ? simvastatin (ZOCOR) 40 MG tablet Take 1 tablet (40 mg total) by mouth at bedtime.  ? traMADol (ULTRAM) 50 MG tablet   ? [DISCONTINUED] ALPRAZolam (XANAX) 0.25 MG tablet Take 1 tablet (0.25 mg total) by mouth 2 (two) times daily as needed for anxiety.  ? [DISCONTINUED] amoxicillin-clavulanate (AUGMENTIN) 875-125 MG tablet Take 1 tablet by mouth 2 (two) times daily.  ? [DISCONTINUED] escitalopram (LEXAPRO) 20 MG tablet Take half tab in am and one tab at night for depression and anxiety  ? [DISCONTINUED] potassium chloride (KLOR-CON) 10 MEQ tablet   ? [DISCONTINUED] sertraline (ZOLOFT) 25 MG tablet Take 1 tablet (25 mg total) by mouth daily.  ? ALPRAZolam (XANAX) 0.25 MG tablet Take 1 tablet (0.25 mg total) by mouth 2 (two) times daily as needed for anxiety.  ? [DISCONTINUED] guaiFENesin-codeine (CHERATUSSIN AC) 100-10 MG/5ML syrup Take 5 mLs by mouth 3 (three) times  daily as needed for cough. (Patient not taking: Reported on 12/14/2021)  ? [DISCONTINUED] Olopatadine HCl 0.2 % SOLN Place 1 drop into both eyes daily as needed (allergies). (Patient not taking: Reported on 12/14/2021)  ? ?No facility-administered encounter medications on file as of 12/14/2021.  ? ? ?Surgical History: ?Past Surgical History:  ?Procedure Laterality Date  ? ABDOMINAL HYSTERECTOMY    ? ANTERIOR CERVICAL DECOMP/DISCECTOMY FUSION N/A 01/20/2014  ? Procedure: ANTERIOR CERVICAL DECOMPRESSION/DISCECTOMY FUSION 2 LEVELS cervical five/six six Tarry Kos;  Surgeon: Ophelia Charter, MD;  Location: [facility];  Service: Neurosurgery;   Laterality: N/A;  ? BACK SURGERY    ? lumbar  ? BREAST BIOPSY Right 02/01/2018  ? x shape, DCIS   ? BREAST BIOPSY  01/16/2019  ? coil, BENIGN MAMMARY TISSUE WITH MODERATE STROMAL FIBROSIS AND COARSE DYSTROPHIC CALCIFICATIONS of the RIGHT breast  ? BREAST LUMPECTOMY Right 03/07/2018  ? Procedure: BREAST LUMPECTOMY/ RE EXCISION;  Surgeon: Herbert Pun, MD;  Location: [facility] ORS;  Service: General;  Laterality: Right;  ? CARDIAC CATHETERIZATION    ? 2011  ? COLONOSCOPY WITH PROPOFOL N/A 02/11/2021  ? Procedure: COLONOSCOPY WITH PROPOFOL;  Surgeon: Lucilla Lame, MD;  Location: [facility];  Service: Endoscopy;  Laterality: N/A;  Requests Early  ? CORONARY ANGIOPLASTY WITH STENT PLACEMENT    ? PLACED IN RCA 2011  ? EYE SURGERY    ? lasik  ? PARTIAL MASTECTOMY WITH NEEDLE LOCALIZATION Right 02/21/2018  ? Procedure: PARTIAL MASTECTOMY WITH NEEDLE LOCALIZATION;  Surgeon: Herbert Pun, MD;  Location: [facility] ORS;  Service: General;  Laterality: Right;  ? POLYPECTOMY N/A 02/11/2021  ? Procedure: POLYPECTOMY;  Surgeon: Lucilla Lame, MD;  Location: [facility];  Service: Endoscopy;  Laterality: N/A;  ? ROTATOR CUFF REPAIR    ? left shoulder  ? SHOULDER ARTHROSCOPY WITH ROTATOR CUFF REPAIR AND OPEN BICEPS TENODESIS Right 08/07/2019  ? Procedure: SHOULDER ARTHROSCOPY WITH DEBRIDEMENT, DECOMPRESSION, ROTATOR CUFF REPAIR AND BICEPS TENODESIS. - RNFA;  Surgeon: Corky Mull, MD;  Location: [facility] ORS;  Service: Orthopedics;  Laterality: Right;  ? TOE SURGERY Right   ? has pin and plate in it  ? ? ?Medical History: ?Past Medical History:  ?Diagnosis Date  ? Anxiety   ? Arthritis   ? Breast cancer ([place])   ? Cancer [facility])   ? breast-right   ? COPD (chronic obstructive pulmonary disease) ([place])   ? Coronary artery disease   ? Dyspnea   ? ONLY WITH EXERTION  ? GERD (gastroesophageal reflux disease)   ? "sometimes"  ? Headache(784.0)   ? sinus headaches  ? Myocardial infarction [facility])   ? in 2011  no damage-1 stent  ?  Personal history of radiation therapy   ? Seizures ([place]) 2014  ? X1-no idea what caused her seizure  ? Sleep apnea   ? USES CPAP  ? ? ?Family History: ?Family History  ?Problem Relation Age of Onset  ? Breast cancer Cousin 30  ?     bilateral at 60 and 53; daughter of maternal aunt who was unaffected  ? Lung cancer Maternal Aunt   ?     dx 4s; deceased 22s; smoker  ? Heart attack Father   ?     deceased 13  ? Stomach cancer Maternal Grandmother   ? ? ?Social History  ? ?Socioeconomic History  ? Marital status: Married  ?  Spouse name: Not on file  ? Number of children: Not on file  ? Years of  education: Not on file  ? Highest education level: Not on file  ?Occupational History  ? Not on file  ?Tobacco Use  ? Smoking status: Former  ?  Packs/day: 1.00  ?  Years: 30.00  ?  Pack years: 30.00  ?  Types: Cigarettes  ?  Quit date: 09/13/2015  ?  Years since quitting: 6.2  ? Smokeless tobacco: Never  ?Vaping Use  ? Vaping Use: Never used  ?Substance and Sexual Activity  ? Alcohol use: Yes  ?  Comment: occasional  ? Drug use: No  ? Sexual activity: Not on file  ?Other Topics Concern  ? Not on file  ?Social History Narrative  ? Not on file  ? ?Social Determinants of Health  ? ?Financial Resource Strain: Not on file  ?Food Insecurity: Not on file  ?Transportation Needs: Not on file  ?Physical Activity: Not on file  ?Stress: Not on file  ?Social Connections: Not on file  ?Intimate Partner Violence: Not on file  ? ? ? ? ?Review of Systems  ?Constitutional:  Negative for chills, fatigue and unexpected weight change.  ?HENT:  Negative for congestion, rhinorrhea, sneezing and sore throat.   ?Eyes:  Negative for redness.  ?Respiratory:  Negative for cough, chest tightness and shortness of breath.   ?Cardiovascular:  Negative for chest pain and palpitations.  ?Gastrointestinal:  Negative for abdominal pain, constipation, diarrhea, nausea and vomiting.  ?Genitourinary:  Negative for dysuria and frequency.  ?Musculoskeletal:  Negative  for arthralgias, back pain, joint swelling and neck pain.  ?Skin:  Negative for rash.  ?Neurological: Negative.  Negative for tremors and numbness.  ?Hematological:  Negative for adenopathy. Does not bruise/bleed easily

## 2021-12-15 ENCOUNTER — Encounter: Payer: Self-pay | Admitting: Nurse Practitioner

## 2021-12-23 ENCOUNTER — Telehealth: Payer: Self-pay

## 2021-12-23 ENCOUNTER — Other Ambulatory Visit: Payer: Self-pay | Admitting: Internal Medicine

## 2021-12-23 DIAGNOSIS — R911 Solitary pulmonary nodule: Secondary | ICD-10-CM

## 2021-12-23 NOTE — Telephone Encounter (Signed)
Patient scheduled for Pet scan on 01/06/22 @ 1:00 Michelle Moore /tat ? ?**Pet scan machine is STILL DOWN AT [facility]** ?

## 2021-12-29 ENCOUNTER — Ambulatory Visit: Payer: Medicare HMO | Admitting: Internal Medicine

## 2022-01-06 ENCOUNTER — Ambulatory Visit (HOSPITAL_COMMUNITY)
Admission: RE | Admit: 2022-01-06 | Discharge: 2022-01-06 | Disposition: A | Discharge status: Home / Self Care | Payer: Medicare HMO | Source: Ambulatory Visit | Attending: Internal Medicine | Admitting: Internal Medicine

## 2022-01-06 DIAGNOSIS — I700 Atherosclerosis of aorta: Secondary | ICD-10-CM | POA: Diagnosis not present

## 2022-01-06 DIAGNOSIS — R911 Solitary pulmonary nodule: Secondary | ICD-10-CM

## 2022-01-06 DIAGNOSIS — Z853 Personal history of malignant neoplasm of breast: Secondary | ICD-10-CM | POA: Diagnosis not present

## 2022-01-06 DIAGNOSIS — I2510 Atherosclerotic heart disease of native coronary artery without angina pectoris: Secondary | ICD-10-CM | POA: Diagnosis not present

## 2022-01-06 DIAGNOSIS — D3501 Benign neoplasm of right adrenal gland: Secondary | ICD-10-CM | POA: Diagnosis not present

## 2022-01-06 LAB — GLUCOSE, CAPILLARY: Glucose-Capillary: 102 mg/dL — ABNORMAL HIGH (ref 70–99)

## 2022-01-06 MED ORDER — FLUDEOXYGLUCOSE F - 18 (FDG) INJECTION
7.0300 | Freq: Once | INTRAVENOUS | Status: AC
  Administered 2022-01-06: 7.03 via INTRAVENOUS

## 2022-01-10 ENCOUNTER — Telehealth (INDEPENDENT_AMBULATORY_CARE_PROVIDER_SITE_OTHER): Payer: Medicare HMO | Admitting: Nurse Practitioner

## 2022-01-10 ENCOUNTER — Encounter: Payer: Self-pay | Admitting: Nurse Practitioner

## 2022-01-10 VITALS — Resp 16 | Ht 60.0 in | Wt 145.0 lb

## 2022-01-10 DIAGNOSIS — L237 Allergic contact dermatitis due to plants, except food: Secondary | ICD-10-CM | POA: Diagnosis not present

## 2022-01-10 MED ORDER — PREDNISONE 10 MG (21) PO TBPK: ORAL_TABLET | ORAL | 0 refills | Status: DC

## 2022-01-10 MED ORDER — TRIAMCINOLONE ACETONIDE 0.1 % EX CREA: 1.0000 "application " | TOPICAL_CREAM | Freq: Two times a day (BID) | CUTANEOUS | 0 refills | Status: DC

## 2022-01-10 NOTE — Progress Notes (Signed)
[facility] [place], [place] [address]  Internal MEDICINE  Telephone Visit  Patient Name: Michelle Moore  [id number]  [id number]  Date of Service: 01/10/2022  I connected with the patient at 4:15 PM by telephone and verified the patients identity using two identifiers.   I discussed the limitations, risks, security and privacy concerns of performing an evaluation and management service by telephone and the availability of in person appointments. I also discussed with the patient that there may be a patient responsible charge related to the service.  The patient expressed understanding and agrees to proceed.    Chief Complaint  Patient presents with   Acute Visit    Poison oak on right eye and right wrist, noticed yesterday    Telephone Assessment    video   Telephone Screen    [id number]    HPI Michelle Moore presents for a telehealth virtual visit for poison oak dermatitis Right wrist, right eye and ankle. Itchy and painful. Also wanted to know results of PET scan which was briefly discussed with the patient with the agreement that she will keep her 01/24/22 appt with Dr. Devona Konig and further discuss the results with him at that office visit.    Current Medication: Outpatient Encounter Medications as of 01/10/2022  Medication Sig   albuterol (VENTOLIN HFA) 108 (90 Base) MCG/ACT inhaler Inhale 1-2 puffs into the lungs every 6 (six) hours as needed for wheezing or shortness of breath.   alendronate (FOSAMAX) 70 MG tablet Take 1 tablet (70 mg total) by mouth once a week. Take with a full glass of water on an empty stomach. Stand or sit upright for 1 hour after taking   ALPRAZolam (XANAX) 0.25 MG tablet Take 1 tablet (0.25 mg total) by mouth 2 (two) times daily as needed for anxiety.   anastrozole (ARIMIDEX) 1 MG tablet TAKE (1) TABLET BY MOUTH EVERY DAY   buPROPion (WELLBUTRIN XL) 150 MG 24 hr tablet TAKE (1) TABLET BY MOUTH EVERY DAY   cetirizine (ZYRTEC) 10 MG  tablet Take 10 mg by mouth daily as needed for allergies.    fluticasone (FLONASE) 50 MCG/ACT nasal spray Place 2 sprays into both nostrils daily as needed for allergies.   fluticasone-salmeterol (ADVAIR) 250-50 MCG/ACT AEPB Inhale 1 puff into the lungs in the morning and at bedtime.   folic acid (FOLVITE) 791 MCG tablet Take 1,600 mcg by mouth daily.   methotrexate (RHEUMATREX) 2.5 MG tablet Take 20 mg by mouth every Wednesday.    montelukast (SINGULAIR) 10 MG tablet Take 1 tablet (10 mg total) by mouth at bedtime.   Omeprazole 20 MG TBEC Take 20 mg by mouth every morning.    potassium chloride (KLOR-CON) 10 MEQ tablet Take 1 tablet (10 mEq total) by mouth daily.   sertraline (ZOLOFT) 50 MG tablet Take 1 tablet for 1 week, and then increase to 1.5 tablets daily   simvastatin (ZOCOR) 40 MG tablet Take 1 tablet (40 mg total) by mouth at bedtime.   traMADol (ULTRAM) 50 MG tablet    triamcinolone cream (KENALOG) 0.1 % Apply 1 application. topically 2 (two) times daily. To affected area. (Patient not taking: Reported on 02/03/2022)   [DISCONTINUED] predniSONE (STERAPRED UNI-PAK 21 TAB) 10 MG (21) TBPK tablet Use as directed for 6 days (Patient not taking: Reported on 02/03/2022)   No facility-administered encounter medications on file as of 01/10/2022.    Surgical History: Past Surgical History:  Procedure Laterality Date   ABDOMINAL HYSTERECTOMY  ANTERIOR CERVICAL DECOMP/DISCECTOMY FUSION N/A 01/20/2014   Procedure: ANTERIOR CERVICAL DECOMPRESSION/DISCECTOMY FUSION 2 LEVELS cervical five/six six Tarry Kos;  Surgeon: Ophelia Charter, MD;  Location: [facility];  Service: Neurosurgery;  Laterality: N/A;   BACK SURGERY     lumbar   BREAST BIOPSY Right 02/01/2018   x shape, DCIS    BREAST BIOPSY  01/16/2019   coil, BENIGN MAMMARY TISSUE WITH MODERATE STROMAL FIBROSIS AND COARSE DYSTROPHIC CALCIFICATIONS of the RIGHT breast   BREAST LUMPECTOMY Right 03/07/2018   Procedure: BREAST LUMPECTOMY/ RE  EXCISION;  Surgeon: Herbert Pun, MD;  Location: [facility] ORS;  Service: General;  Laterality: Right;   CARDIAC CATHETERIZATION     2011   COLONOSCOPY WITH PROPOFOL N/A 02/11/2021   Procedure: COLONOSCOPY WITH PROPOFOL;  Surgeon: Lucilla Lame, MD;  Location: [facility];  Service: Endoscopy;  Laterality: N/A;  Requests Early   CORONARY ANGIOPLASTY WITH STENT PLACEMENT     PLACED IN RCA 2011   EYE SURGERY     lasik   PARTIAL MASTECTOMY WITH NEEDLE LOCALIZATION Right 02/21/2018   Procedure: PARTIAL MASTECTOMY WITH NEEDLE LOCALIZATION;  Surgeon: Herbert Pun, MD;  Location: [facility] ORS;  Service: General;  Laterality: Right;   POLYPECTOMY N/A 02/11/2021   Procedure: POLYPECTOMY;  Surgeon: Lucilla Lame, MD;  Location: [facility];  Service: Endoscopy;  Laterality: N/A;   ROTATOR CUFF REPAIR     left shoulder   SHOULDER ARTHROSCOPY WITH ROTATOR CUFF REPAIR AND OPEN BICEPS TENODESIS Right 08/07/2019   Procedure: SHOULDER ARTHROSCOPY WITH DEBRIDEMENT, DECOMPRESSION, ROTATOR CUFF REPAIR AND BICEPS TENODESIS. - RNFA;  Surgeon: Corky Mull, MD;  Location: [facility] ORS;  Service: Orthopedics;  Laterality: Right;   TOE SURGERY Right    has pin and plate in it    Medical History: Past Medical History:  Diagnosis Date   Anxiety    Arthritis    Breast cancer ([place])    Cancer ([place])    breast-right    COPD (chronic obstructive pulmonary disease) ([place])    Coronary artery disease    Dyspnea    ONLY WITH EXERTION   GERD (gastroesophageal reflux disease)    "sometimes"   Headache(784.0)    sinus headaches   Myocardial infarction ([place])    in 2011  no damage-1 stent   Personal history of radiation therapy    Seizures ([place]) 2014   X1-no idea what caused her seizure   Sleep apnea    USES CPAP    Family History: Family History  Problem Relation Age of Onset   Breast cancer Cousin 80       bilateral at 91 and 16; daughter of maternal aunt who was unaffected   Lung cancer  Maternal Aunt        dx 56s; deceased 30s; smoker   Heart attack Father        deceased 48   Stomach cancer Maternal Grandmother     Social History   Socioeconomic History   Marital status: Married    Spouse name: Not on file   Number of children: Not on file   Years of education: Not on file   Highest education level: Not on file  Occupational History   Not on file  Tobacco Use   Smoking status: Former    Packs/day: 1.00    Years: 30.00    Pack years: 30.00    Types: Cigarettes    Quit date: 09/13/2015    Years since quitting: 6.4   Smokeless tobacco:  Never  Vaping Use   Vaping Use: Never used  Substance and Sexual Activity   Alcohol use: Yes    Comment: occasional   Drug use: No   Sexual activity: Not on file  Other Topics Concern   Not on file  Social History Narrative   Not on file   Social Determinants of Health   Financial Resource Strain: Not on file  Food Insecurity: Not on file  Transportation Needs: Not on file  Physical Activity: Not on file  Stress: Not on file  Social Connections: Not on file  Intimate Partner Violence: Not on file      Review of Systems  Constitutional:  Negative for chills, fatigue and unexpected weight change.  HENT:  Negative for congestion, rhinorrhea, sneezing and sore throat.   Eyes:  Negative for redness.  Respiratory:  Negative for cough, chest tightness and shortness of breath.   Cardiovascular:  Negative for chest pain and palpitations.  Gastrointestinal:  Negative for abdominal pain, constipation, diarrhea, nausea and vomiting.  Genitourinary:  Negative for dysuria and frequency.  Musculoskeletal:  Negative for arthralgias, back pain, joint swelling and neck pain.  Skin:  Positive for rash.       Pruritic crusted and weeping rash  Neurological: Negative.  Negative for tremors and numbness.  Hematological:  Negative for adenopathy. Does not bruise/bleed easily.  Psychiatric/Behavioral:  Negative for behavioral  problems (Depression), sleep disturbance and suicidal ideas. The patient is not nervous/anxious.    Vital Signs: Resp 16   Ht 5' (1.524 m)   Wt 145 lb (65.8 kg)   BMI 28.32 kg/m    Observation/Objective: She is alert and oriented and engages in conversation appropriately. She does not appear to be in any acute distress over video call.    Assessment/Plan: 1. 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 She was prescribed a 6-day prednisone taper and topical triamcinolone. This is typical treatment for her in the past as well. Follow up as needed for worsening or no improvement of symptoms.  - triamcinolone cream (KENALOG) 0.1 %; Apply 1 application. topically 2 (two) times daily. To affected area. (Patient not taking: Reported on 02/03/2022)  Dispense: 45 g; Refill: 0   General Counseling: Shaleta verbalizes understanding of the findings of today's phone visit and agrees with plan of treatment. I have discussed any further diagnostic evaluation that may be needed or ordered today. We also reviewed her medications today. she has been encouraged to call the office with any questions or concerns that should arise related to todays visit.  Return if symptoms worsen or fail to improve, for and also upcoming appt with DSK related to PET scan.   No orders of the defined types were placed in this encounter.   Meds ordered this encounter  Medications   DISCONTD: predniSONE (STERAPRED UNI-PAK 21 TAB) 10 MG (21) TBPK tablet    Sig: Use as directed for 6 days    Dispense:  21 tablet    Refill:  0   triamcinolone cream (KENALOG) 0.1 %    Sig: Apply 1 application. topically 2 (two) times daily. To affected area.    Dispense:  45 g    Refill:  0    Time spent:30 Minutes Time spent with patient included reviewing progress notes, labs, imaging studies, and discussing plan for follow up.  [place] Controlled Substance Database was reviewed by me for overdose risk score  (ORS) if appropriate.  This patient was seen by  Jonetta Osgood, FNP-C in collaboration with Dr. Clayborn Bigness as a part of collaborative care agreement.  Anthany Thornhill R. Valetta Fuller, MSN, FNP-C Internal medicine

## 2022-01-12 ENCOUNTER — Ambulatory Visit: Payer: Medicare HMO | Admitting: Internal Medicine

## 2022-01-12 DIAGNOSIS — R0602 Shortness of breath: Secondary | ICD-10-CM | POA: Diagnosis not present

## 2022-01-17 ENCOUNTER — Ambulatory Visit: Payer: Medicare HMO | Admitting: Nurse Practitioner

## 2022-01-20 ENCOUNTER — Ambulatory Visit: Payer: Medicare HMO | Admitting: Nurse Practitioner

## 2022-01-24 ENCOUNTER — Encounter: Payer: Self-pay | Admitting: Internal Medicine

## 2022-01-24 ENCOUNTER — Telehealth: Payer: Self-pay

## 2022-01-24 ENCOUNTER — Ambulatory Visit: Payer: Medicare HMO | Admitting: Internal Medicine

## 2022-01-24 VITALS — BP 126/80 | HR 83 | Temp 98.3°F | Resp 16 | Ht 60.0 in | Wt 140.0 lb

## 2022-01-24 DIAGNOSIS — R911 Solitary pulmonary nodule: Secondary | ICD-10-CM

## 2022-01-24 DIAGNOSIS — J449 Chronic obstructive pulmonary disease, unspecified: Secondary | ICD-10-CM | POA: Diagnosis not present

## 2022-01-24 DIAGNOSIS — D3502 Benign neoplasm of left adrenal gland: Secondary | ICD-10-CM

## 2022-01-24 NOTE — Patient Instructions (Signed)
Pulmonary Nodule ? ?A pulmonary nodule is a small, round growth of tissue in the lung. A nodule may be cancer, but most nodules are not cancer. ?What are the causes? ?Infection from a germ (bacteria, fungus, or virus), such as tuberculosis. ?Tissue that is cancer, such as: ?Cancer in the lung. ?Cancer that has spread to the lung from another part of the body. ?A growth of tissue (mass) that is not cancer. ?Swelling and irritation from conditions such as rheumatoid arthritis. ?Having blood vessels that are not normal in the lungs. ?What are the signs or symptoms? ?Many times, there are no symptoms. If you get symptoms, they normally have another cause, such as infection. ?How is this treated? ?Treatment depends on: ?If your nodule is cancer or if it is not cancer. ?What your risk of getting cancer is. ?Some nodules are not cancer. If this is the case for you, you may not need treatment. Your doctor may do tests to watch the nodule for changes. ?If the nodule is cancer: ?You will need tests, such as CT and PET scans. ?You may need treatment. This may include: ?Surgery. ?Treatment with high-energy X-rays (radiation therapy). ?Medicines. ?Some nodules need to be taken out. You may have a procedure to have the nodule taken out. During the procedure, your doctor will make a cut (incision) into your chest and take out the part of your lung that has the nodule. ?Follow these instructions at home: ?Take over-the-counter and prescription medicines only as told by your doctor. ?Do not smoke or use any products that contain nicotine or tobacco. If you need help quitting, ask your doctor. ?Keep all follow-up visits. ?Contact a doctor if: ?You have pain in your chest, back, or shoulder. ?You are short of breath or have trouble breathing when you are active. ?You get a cough. ?Your voice starts to sound raspy, breathy, or strained (hoarse), and you do not know why. ?You feel sick or more tired than normal. ?You do not feel like  eating. ?You lose weight without trying. ?You get chills, or you start to sweat a lot during sleep. ?You need two or more pillows to sleep on at night. ?You have: ?A fever and your symptoms get worse all of a sudden. ?A fever or symptoms for more than 2-3 days. ?Get help right away if: ?You cannot catch your breath. ?You have sudden chest pain. ?You start making high-pitched whistling sounds when you breathe, most often when you breathe out (you wheeze). ?You cannot stop coughing. ?You cough up blood or bloody mucus from your lungs (sputum). ?You get dizzy or feel like you may faint. ?These symptoms may represent a serious problem that is an emergency. Do not wait to see if the symptoms will go away. Get medical help right away. Call your local emergency services (911 in the [place] Do not drive yourself to the hospital. ?Summary ?A pulmonary nodule is a small, round growth of tissue in the lung. Most of these nodules are not cancer. ?Common causes of nodules in the lung include infection, swelling and irritation, and growths that are not cancer. ?Treatment depends on whether the nodule is cancer or is not cancer. Treatment also depends on your risk of getting cancer. ?If the nodule is cancer, you will need certain tests and treatments as told by your doctor. ?This information is not intended to replace advice given to you by your health care provider. Make sure you discuss any questions you have with your health care provider. ?Document  Revised: 03/18/2020 Document Reviewed: 03/18/2020 ?Elsevier Patient Education ? [place]. ? ?

## 2022-01-24 NOTE — Telephone Encounter (Signed)
CT guided needle ordered. Printed. Gave to Titania-Toni ?

## 2022-01-24 NOTE — Progress Notes (Signed)
[facility] [place], [place] [address]  Pulmonary Sleep Medicine   Office Visit Note  Patient Name: Michelle Moore DOB: 18-Oct-1955 MRN [id number]  Date of Service: 01/24/2022  Complaints/HPI: PET scan follow up discussion done today.  She has a 2.2 cm spiculated nodule in the upper lobe on the right side favoring primary bronchogenic carcinoma with abnormal SUV uptake.  There is also some uptake in the ribs which could be suggestive of pathological fracture.  I spoke with her at length about what to do from here on.  She states that she would like to discuss with her oncologist and so therefore we will go ahead and get her referred to her primary oncologist.  She is going to need a biopsy for diagnosis and number to go ahead and schedule this in the meantime for her  ROS  General: (-) fever, (-) chills, (-) night sweats, (-) weakness Skin: (-) rashes, (-) itching,. Eyes: (-) visual changes, (-) redness, (-) itching. Nose and Sinuses: (-) nasal stuffiness or itchiness, (-) postnasal drip, (-) nosebleeds, (-) sinus trouble. Mouth and Throat: (-) sore throat, (-) hoarseness. Neck: (-) swollen glands, (-) enlarged thyroid, (-) neck pain. Respiratory: - cough, (-) bloody sputum, - shortness of breath, - wheezing. Cardiovascular: - ankle swelling, (-) chest pain. Lymphatic: (-) lymph node enlargement. Neurologic: (-) numbness, (-) tingling. Psychiatric: (-) anxiety, (-) depression   Current Medication: Outpatient Encounter Medications as of 01/24/2022  Medication Sig   albuterol (VENTOLIN HFA) 108 (90 Base) MCG/ACT inhaler Inhale 1-2 puffs into the lungs every 6 (six) hours as needed for wheezing or shortness of breath.   alendronate (FOSAMAX) 70 MG tablet Take 1 tablet (70 mg total) by mouth once a week. Take with a full glass of water on an empty stomach. Stand or sit upright for 1 hour after taking   ALPRAZolam (XANAX) 0.25 MG tablet Take 1 tablet (0.25 mg  total) by mouth 2 (two) times daily as needed for anxiety.   anastrozole (ARIMIDEX) 1 MG tablet TAKE (1) TABLET BY MOUTH EVERY DAY   buPROPion (WELLBUTRIN XL) 150 MG 24 hr tablet TAKE (1) TABLET BY MOUTH EVERY DAY   cetirizine (ZYRTEC) 10 MG tablet Take 10 mg by mouth daily as needed for allergies.    fluticasone (FLONASE) 50 MCG/ACT nasal spray Place 2 sprays into both nostrils daily as needed for allergies.   fluticasone-salmeterol (ADVAIR) 250-50 MCG/ACT AEPB Inhale 1 puff into the lungs in the morning and at bedtime.   folic acid (FOLVITE) 700 MCG tablet Take 1,600 mcg by mouth daily.   methotrexate (RHEUMATREX) 2.5 MG tablet Take 20 mg by mouth every Wednesday.    montelukast (SINGULAIR) 10 MG tablet Take 1 tablet (10 mg total) by mouth at bedtime.   Omeprazole 20 MG TBEC Take 20 mg by mouth every morning.    potassium chloride (KLOR-CON) 10 MEQ tablet Take 1 tablet (10 mEq total) by mouth daily.   predniSONE (STERAPRED UNI-PAK 21 TAB) 10 MG (21) TBPK tablet Use as directed for 6 days   sertraline (ZOLOFT) 50 MG tablet Take 1 tablet for 1 week, and then increase to 1.5 tablets daily   simvastatin (ZOCOR) 40 MG tablet Take 1 tablet (40 mg total) by mouth at bedtime.   traMADol (ULTRAM) 50 MG tablet    triamcinolone cream (KENALOG) 0.1 % Apply 1 application. topically 2 (two) times daily. To affected area.   No facility-administered encounter medications on file as of 01/24/2022.  Surgical History: Past Surgical History:  Procedure Laterality Date   ABDOMINAL HYSTERECTOMY     ANTERIOR CERVICAL DECOMP/DISCECTOMY FUSION N/A 01/20/2014   Procedure: ANTERIOR CERVICAL DECOMPRESSION/DISCECTOMY FUSION 2 LEVELS cervical five/six six [place];  Surgeon: Ophelia Charter, MD;  Location: [facility];  Service: Neurosurgery;  Laterality: N/A;   BACK SURGERY     lumbar   BREAST BIOPSY Right 02/01/2018   x shape, DCIS    BREAST BIOPSY  01/16/2019   coil, BENIGN MAMMARY TISSUE WITH MODERATE  STROMAL FIBROSIS AND COARSE DYSTROPHIC CALCIFICATIONS of the RIGHT breast   BREAST LUMPECTOMY Right 03/07/2018   Procedure: BREAST LUMPECTOMY/ RE EXCISION;  Surgeon: Herbert Pun, MD;  Location: [facility];  Service: General;  Laterality: Right;   CARDIAC CATHETERIZATION     2011   COLONOSCOPY WITH PROPOFOL N/A 02/11/2021   Procedure: COLONOSCOPY WITH PROPOFOL;  Surgeon: Lucilla Lame, MD;  Location: [place];  Service: Endoscopy;  Laterality: N/A;  Requests Early   CORONARY ANGIOPLASTY WITH STENT PLACEMENT     PLACED IN [facility] 2011   EYE SURGERY     lasik   PARTIAL MASTECTOMY WITH NEEDLE LOCALIZATION Right 02/21/2018   Procedure: PARTIAL MASTECTOMY WITH NEEDLE LOCALIZATION;  Surgeon: Herbert Pun, MD;  Location: [facility];  Service: General;  Laterality: Right;   POLYPECTOMY N/A 02/11/2021   Procedure: POLYPECTOMY;  Surgeon: Lucilla Lame, MD;  Location: [facility];  Service: Endoscopy;  Laterality: N/A;   ROTATOR CUFF REPAIR     left shoulder   SHOULDER ARTHROSCOPY WITH ROTATOR CUFF REPAIR AND OPEN BICEPS TENODESIS Right 08/07/2019   Procedure: SHOULDER ARTHROSCOPY WITH DEBRIDEMENT, DECOMPRESSION, ROTATOR CUFF REPAIR AND BICEPS TENODESIS. - RNFA;  Surgeon: Corky Mull, MD;  Location: [facility];  Service: Orthopedics;  Laterality: Right;   TOE SURGERY Right    has pin and plate in it    Medical History: Past Medical History:  Diagnosis Date   Anxiety    Arthritis    Breast cancer ([place])    Cancer ([place])    breast-right    COPD (chronic obstructive pulmonary disease) ([place])    Coronary artery disease    Dyspnea    ONLY WITH EXERTION   GERD (gastroesophageal reflux disease)    "sometimes"   Headache(784.0)    sinus headaches   Myocardial infarction ([place])    in 2011  no damage-1 stent   Personal history of radiation therapy    Seizures ([place]) 2014   X1-no idea what caused her seizure   Sleep apnea    USES CPAP    Family History: Family History   Problem Relation Age of Onset   Breast cancer Cousin 82       bilateral at 49 and 47; daughter of maternal aunt who was unaffected   Lung cancer Maternal Aunt        dx 73s; deceased 72s; smoker   Heart attack Father        deceased 63   Stomach cancer Maternal Grandmother     Social History: Social History   Socioeconomic History   Marital status: Married    Spouse name: Not on file   Number of children: Not on file   Years of education: Not on file   Highest education level: Not on file  Occupational History   Not on file  Tobacco Use   Smoking status: Former    Packs/day: 1.00    Years: 30.00    Pack years: 30.00  Types: Cigarettes    Quit date: 09/13/2015    Years since quitting: 6.3   Smokeless tobacco: Never  Vaping Use   Vaping Use: Never used  Substance and Sexual Activity   Alcohol use: Yes    Comment: occasional   Drug use: No   Sexual activity: Not on file  Other Topics Concern   Not on file  Social History Narrative   Not on file   Social Determinants of Health   Financial Resource Strain: Not on file  Food Insecurity: Not on file  Transportation Needs: Not on file  Physical Activity: Not on file  Stress: Not on file  Social Connections: Not on file  Intimate Partner Violence: Not on file    Vital Signs: Blood pressure 126/80, pulse 83, temperature 98.3 F (36.8 C), resp. rate 16, height 5' (1.524 m), weight 140 lb (63.5 kg), SpO2 96 %.  Examination: General Appearance: The patient is well-developed, well-nourished, and in no distress. Skin: Gross inspection of skin unremarkable. Head: normocephalic, no gross deformities. Eyes: no gross deformities noted. ENT: ears appear grossly normal no exudates. Neck: Supple. No thyromegaly. No LAD. Respiratory: no rhonchi noted. Cardiovascular: Normal S1 and S2 without murmur or rub. Extremities: No cyanosis. pulses are equal. Neurologic: Alert and oriented. No involuntary  movements.  LABS: Recent Results (from the past 2160 hour(s))  Glucose, capillary     Status: Abnormal   Collection Time: 01/06/22 12:57 PM  Result Value Ref Range   Glucose-Capillary 102 (H) 70 - 99 mg/dL    Comment: Glucose reference range applies only to samples taken after fasting for at least 8 hours.    Radiology: NM PET Image Initial (PI) Skull Base To Thigh (F-18 FDG)  Result Date: 01/07/2022 CLINICAL DATA:  Initial treatment strategy for multiple lung nodules. History of right breast cancer. EXAM: NUCLEAR MEDICINE PET SKULL BASE TO THIGH TECHNIQUE: 7.0 mCi F-18 FDG was injected intravenously. Full-ring PET imaging was performed from the skull base to thigh after the radiotracer. CT data was obtained and used for attenuation correction and anatomic localization. Fasting blood glucose: 102 mg/dl COMPARISON:  CT chest abdomen pelvis dated 11/29/2021 FINDINGS: Mediastinal blood pool activity: SUV max 3.0 Liver activity: SUV max NA NECK: No hypermetabolic cervical lymphadenopathy. Incidental CT findings: none CHEST: 2.2 x 1.9 cm spiculated nodule in the anterior right upper lobe (series 7/image 27), abutting the right anterior chest wall, max SUV 16.4. This appearance favors primary bronchogenic neoplasm over metastasis. Bandlike patchy opacity in the anterior left upper lobe (series 7/image 37), max SUV 3.7, new from recent prior and favored to be infectious/inflammatory. Small mediastinal nodes, including a dominant 12 mm short axis low right paratracheal node (series 4/image 60), max SUV 4.5. Postprocedural changes in the lateral right breast with associated 6.4 x 4.5 cm seroma (series 4/image 72), non FDG avid. Incidental CT findings: Atherosclerotic calcifications of the aortic arch. Three vessel coronary atherosclerosis. ABDOMEN/PELVIS: No abnormal hypermetabolism in the liver, spleen, pancreas, or adrenal glands. No hypermetabolic abdominopelvic lymphadenopathy. Incidental CT findings: 2.3  cm right adrenal adenoma. Atherosclerotic calcifications of the abdominal aorta and branch vessels. Status post hysterectomy. SKELETON: Destructive changes involving the right lateral 7th rib (series 4/image 82), max SUV 4.7, worrisome for pathologic fracture. Incidental CT findings: Cervical spine fixation hardware. Lumbar spine fixation hardware. Mild degenerative changes of the visualized thoracolumbar spine. IMPRESSION: 2.2 cm spiculated nodule in the anterior right upper lobe, favoring primary bronchogenic neoplasm over metastasis. Dominant 12 mm short axis low  right paratracheal node, indeterminate. Attention on follow-up is suggested. Destructive changes involving the right lateral 7th rib, worrisome for pathologic fracture. Bandlike opacity in the anterior left upper lobe, favored to be infectious/inflammatory. Postoperative seroma in the right breast, non FDG avid. Benign right adrenal adenoma. Electronically Signed   By: Julian Hy M.D.   On: 01/07/2022 00:52    No results found.  NM PET Image Initial (PI) Skull Base To Thigh (F-18 FDG)  Result Date: 01/07/2022 CLINICAL DATA:  Initial treatment strategy for multiple lung nodules. History of right breast cancer. EXAM: NUCLEAR MEDICINE PET SKULL BASE TO THIGH TECHNIQUE: 7.0 mCi F-18 FDG was injected intravenously. Full-ring PET imaging was performed from the skull base to thigh after the radiotracer. CT data was obtained and used for attenuation correction and anatomic localization. Fasting blood glucose: 102 mg/dl COMPARISON:  CT chest abdomen pelvis dated 11/29/2021 FINDINGS: Mediastinal blood pool activity: SUV max 3.0 Liver activity: SUV max NA NECK: No hypermetabolic cervical lymphadenopathy. Incidental CT findings: none CHEST: 2.2 x 1.9 cm spiculated nodule in the anterior right upper lobe (series 7/image 27), abutting the right anterior chest wall, max SUV 16.4. This appearance favors primary bronchogenic neoplasm over metastasis.  Bandlike patchy opacity in the anterior left upper lobe (series 7/image 37), max SUV 3.7, new from recent prior and favored to be infectious/inflammatory. Small mediastinal nodes, including a dominant 12 mm short axis low right paratracheal node (series 4/image 60), max SUV 4.5. Postprocedural changes in the lateral right breast with associated 6.4 x 4.5 cm seroma (series 4/image 72), non FDG avid. Incidental CT findings: Atherosclerotic calcifications of the aortic arch. Three vessel coronary atherosclerosis. ABDOMEN/PELVIS: No abnormal hypermetabolism in the liver, spleen, pancreas, or adrenal glands. No hypermetabolic abdominopelvic lymphadenopathy. Incidental CT findings: 2.3 cm right adrenal adenoma. Atherosclerotic calcifications of the abdominal aorta and branch vessels. Status post hysterectomy. SKELETON: Destructive changes involving the right lateral 7th rib (series 4/image 82), max SUV 4.7, worrisome for pathologic fracture. Incidental CT findings: Cervical spine fixation hardware. Lumbar spine fixation hardware. Mild degenerative changes of the visualized thoracolumbar spine. IMPRESSION: 2.2 cm spiculated nodule in the anterior right upper lobe, favoring primary bronchogenic neoplasm over metastasis. Dominant 12 mm short axis low right paratracheal node, indeterminate. Attention on follow-up is suggested. Destructive changes involving the right lateral 7th rib, worrisome for pathologic fracture. Bandlike opacity in the anterior left upper lobe, favored to be infectious/inflammatory. Postoperative seroma in the right breast, non FDG avid. Benign right adrenal adenoma. Electronically Signed   By: Julian Hy M.D.   On: 01/07/2022 00:52      Assessment and Plan: Patient Active Problem List   Diagnosis Date Noted   S/P cardiac catheterization 04/06/2021   Osteoporosis without current pathological fracture 04/06/2021   Aromatase inhibitor use 04/06/2021   Special screening for malignant  neoplasms, colon    Family history of colon cancer    Polyp of descending colon    Cough 08/28/2020   Hypokalemia 07/25/2020   Urinary tract infection without hematuria 07/25/2020   Recurrent displacement of lumbar disc 04/08/2020   Essential hypertension 01/22/2020   Degenerative tear of glenoid labrum of right shoulder 07/19/2019   Nontraumatic complete tear of right rotator cuff 07/19/2019   Rotator cuff tendinitis, right 07/19/2019   Tendinitis of upper biceps tendon of right shoulder 07/19/2019   Encounter for general adult medical examination with abnormal findings 05/29/2019   Dysuria 05/29/2019   Acute non-recurrent pansinusitis 06/01/2018   Perennial allergic rhinitis 06/01/2018  Generalized anxiety disorder 06/01/2018   Malignant neoplasm of right female breast ([place]) 06/01/2018   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   Coronary artery disease 11/09/2017   Mixed hyperlipidemia 11/09/2017   Bilateral carotid artery stenosis 08/14/2014   Gastroesophageal reflux disease with esophagitis 08/14/2014   OSA on CPAP 07/14/2014   Weight loss of more than 10% body weight 07/14/2014   Cervical spondylosis with radiculopathy 01/20/2014   Cervical spondylosis without myelopathy 01/20/2014    1. Pulmonary nodule 1 cm or greater in diameter Positive PET scan nodule noted she will need a CT-guided needle biopsy we will get this scheduled.  In addition to that she will go see her oncologist for further guidance as far as where to go in terms of treatment once a diagnosis is made - [place]; Future - Ambulatory referral to Hematology / Oncology  2. Obstructive chronic bronchitis without exacerbation ([place]) Appears to be controlled we will continue with recommended inhalers and regimen.  3. Adrenal adenoma, left Appears to be benign this will be monitored  General Counseling: I have discussed the findings of the evaluation and examination with  Juliann Pulse.  I have also discussed any further diagnostic evaluation thatmay be needed or ordered today. Marvie verbalizes understanding of the findings of todays visit. We also reviewed her medications today and discussed drug interactions and side effects including but not limited excessive drowsiness and altered mental states. We also discussed that there is always a risk not just to her but also people around her. she has been encouraged to call the office with any questions or concerns that should arise related to todays visit.  Orders Placed This Encounter  Procedures   CT GUIDED NEEDLE PLACEMENT    Standing Status:   Future    Standing Expiration Date:   01/25/2023    Order Specific Question:   If indicated for the ordered procedure, I authorize the administration of contrast media per Radiology protocol    Answer:   Yes    Order Specific Question:   Reason for Exam (SYMPTOM  OR DIAGNOSIS REQUIRED)    Answer:   pulm nodule    Order Specific Question:   Preferred imaging location?    Answer:   [place]   Ambulatory referral to Hematology / Oncology    Referral Priority:   Routine    Referral Type:   Consultation    Referral Reason:   Specialty Services Required    Requested Specialty:   Oncology    Number of Visits Requested:   1     Time spent: 75  I have personally obtained a history, examined the patient, evaluated laboratory and imaging results, formulated the assessment and plan and placed orders.    Allyne Gee, MD [facility] Pulmonary and Critical Care Sleep medicine

## 2022-01-27 ENCOUNTER — Ambulatory Visit: Payer: Medicare HMO | Admitting: Internal Medicine

## 2022-01-31 ENCOUNTER — Encounter: Payer: Self-pay | Admitting: Internal Medicine

## 2022-02-01 ENCOUNTER — Other Ambulatory Visit: Payer: Self-pay | Admitting: Internal Medicine

## 2022-02-02 ENCOUNTER — Telehealth: Payer: Self-pay

## 2022-02-02 NOTE — Procedures (Signed)
[facility] [address] [place], [address]    Complete Pulmonary Function Testing Interpretation:  FINDINGS:  The forced vital capacity is severely decreased.  FEV1 was 0.9 L which is 44% of predicted and is severely decreased.  FEV1 FVC ratio is mildly decreased.  Postbronchodilator there is no significant change in the FEV1 however clinical improvement may still occur.  Total lung capacity is mildly decreased.  Residual volume is normal residual volume total lung capacity ratio was increased FRC was decreased.  DLCO was moderately decreased.  IMPRESSION:  This pulmonary function study is suggestive of a severe obstructive lung disease with a mild restrictive component clinical correlation is recommended  Allyne Gee, MD [facility] Pulmonary Critical Care Medicine Sleep Medicine

## 2022-02-02 NOTE — Telephone Encounter (Signed)
Patient scheduled for ct guided boipsy on 02/08/22 @9 :00 armc/tat

## 2022-02-03 ENCOUNTER — Inpatient Hospital Stay: Payer: Medicare HMO

## 2022-02-03 ENCOUNTER — Encounter: Payer: Self-pay | Admitting: *Deleted

## 2022-02-03 ENCOUNTER — Inpatient Hospital Stay: Payer: Medicare HMO | Attending: Oncology | Admitting: Oncology

## 2022-02-03 ENCOUNTER — Encounter: Payer: Self-pay | Admitting: Oncology

## 2022-02-03 ENCOUNTER — Encounter: Payer: Self-pay | Admitting: Internal Medicine

## 2022-02-03 DIAGNOSIS — Z170 Estrogen receptor positive status [ER+]: Secondary | ICD-10-CM | POA: Diagnosis not present

## 2022-02-03 DIAGNOSIS — M8580 Other specified disorders of bone density and structure, unspecified site: Secondary | ICD-10-CM | POA: Diagnosis not present

## 2022-02-03 DIAGNOSIS — J449 Chronic obstructive pulmonary disease, unspecified: Secondary | ICD-10-CM | POA: Diagnosis not present

## 2022-02-03 DIAGNOSIS — I252 Old myocardial infarction: Secondary | ICD-10-CM | POA: Insufficient documentation

## 2022-02-03 DIAGNOSIS — R911 Solitary pulmonary nodule: Secondary | ICD-10-CM | POA: Diagnosis not present

## 2022-02-03 DIAGNOSIS — Z923 Personal history of irradiation: Secondary | ICD-10-CM | POA: Insufficient documentation

## 2022-02-03 DIAGNOSIS — M1990 Unspecified osteoarthritis, unspecified site: Secondary | ICD-10-CM | POA: Diagnosis not present

## 2022-02-03 DIAGNOSIS — Z79899 Other long term (current) drug therapy: Secondary | ICD-10-CM | POA: Diagnosis not present

## 2022-02-03 DIAGNOSIS — D0511 Intraductal carcinoma in situ of right breast: Secondary | ICD-10-CM | POA: Insufficient documentation

## 2022-02-03 DIAGNOSIS — F419 Anxiety disorder, unspecified: Secondary | ICD-10-CM | POA: Insufficient documentation

## 2022-02-03 DIAGNOSIS — I2510 Atherosclerotic heart disease of native coronary artery without angina pectoris: Secondary | ICD-10-CM | POA: Diagnosis not present

## 2022-02-03 DIAGNOSIS — F17210 Nicotine dependence, cigarettes, uncomplicated: Secondary | ICD-10-CM | POA: Diagnosis not present

## 2022-02-03 DIAGNOSIS — Z79811 Long term (current) use of aromatase inhibitors: Secondary | ICD-10-CM | POA: Diagnosis not present

## 2022-02-03 DIAGNOSIS — K219 Gastro-esophageal reflux disease without esophagitis: Secondary | ICD-10-CM | POA: Insufficient documentation

## 2022-02-03 LAB — PULMONARY FUNCTION TEST

## 2022-02-03 NOTE — Progress Notes (Signed)
Patient on schedule for Lung biopsy 5/30, called and spoke with patient on phone with pre procedure instructions given. Made aware to be here @ 0800, NPO after MN prior to procedure and driver post procedure/recovery/discharge.stated understanding.

## 2022-02-03 NOTE — Progress Notes (Signed)
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    INTERVAL HISTORY: Patient was last evaluated in clinic in December 2021.  She is referred back for new suspicious pulmonary nodule.  She had recent imaging done secondary to chronic cough and was found to have a right upper lobe pulmonary nodule highly suspicious for underlying malignancy.  She currently feels well and is asymptomatic.  She has no neurologic complaints.  She denies any recent fevers or illnesses.  She has a good appetite and denies weight loss.  She denies any chest pain, shortness of breath, cough, or hemoptysis.  She denies any nausea, vomiting, constipation, or diarrhea. She has no urinary complaints.  Patient offers no further specific complaints today.  REVIEW OF SYSTEMS:   Review of Systems  Constitutional: Negative.  Negative for fever, malaise/fatigue and weight loss.  Respiratory: Negative.  Negative for cough, hemoptysis and shortness of breath.   Cardiovascular: Negative.  Negative for chest pain and leg swelling.  Gastrointestinal: Negative.  Negative for abdominal pain.  Genitourinary: Negative.  Negative for dysuria.  Musculoskeletal: Negative.  Negative for back pain and myalgias.  Skin: Negative.  Negative for rash.  Neurological: Negative.  Negative for dizziness, sensory change, focal weakness and weakness.  Psychiatric/Behavioral: Negative.  The patient is not nervous/anxious.    As per HPI. Otherwise, a complete review of systems is negative.  PAST MEDICAL HISTORY: Past Medical History:  Diagnosis Date   Anxiety    Arthritis    Breast cancer ([place])    Cancer ([place])    breast-right    COPD (chronic obstructive pulmonary disease)  ([place])    Coronary artery disease    Dyspnea    ONLY WITH EXERTION   GERD (gastroesophageal reflux disease)    "sometimes"   Headache(784.0)    sinus headaches   Myocardial infarction ([place])    in 2011  no damage-1 stent   Personal history of radiation therapy    Seizures ([place]) 2014   X1-no idea what caused her seizure   Sleep apnea    USES CPAP    PAST SURGICAL HISTORY: Past Surgical History:  Procedure Laterality Date   ABDOMINAL HYSTERECTOMY     ANTERIOR CERVICAL DECOMP/DISCECTOMY FUSION N/A 01/20/2014   Procedure: ANTERIOR CERVICAL DECOMPRESSION/DISCECTOMY FUSION 2 LEVELS cervical five/six six Tarry Kos;  Surgeon: Ophelia Charter, MD;  Location: [place] [facility];  Service: Neurosurgery;  Laterality: N/A;   BACK SURGERY     lumbar   BREAST BIOPSY Right 02/01/2018   x shape, DCIS    BREAST BIOPSY  01/16/2019   coil, BENIGN MAMMARY TISSUE WITH MODERATE STROMAL FIBROSIS AND COARSE DYSTROPHIC CALCIFICATIONS of the RIGHT breast   BREAST LUMPECTOMY Right 03/07/2018   Procedure: BREAST LUMPECTOMY/ RE EXCISION;  Surgeon: Herbert Pun, MD;  Location: [facility];  Service: General;  Laterality: Right;   CARDIAC CATHETERIZATION     2011   COLONOSCOPY WITH PROPOFOL N/A 02/11/2021   Procedure: COLONOSCOPY WITH PROPOFOL;  Surgeon: Lucilla Lame, MD;  Location: [place];  Service: Endoscopy;  Laterality: N/A;  Requests Early   CORONARY ANGIOPLASTY WITH STENT PLACEMENT     PLACED IN RCA 2011   EYE SURGERY     lasik   PARTIAL MASTECTOMY  WITH NEEDLE LOCALIZATION Right 02/21/2018   Procedure: PARTIAL MASTECTOMY WITH NEEDLE LOCALIZATION;  Surgeon: Herbert Pun, MD;  Location: [facility];  Service: General;  Laterality: Right;   POLYPECTOMY N/A 02/11/2021   Procedure: POLYPECTOMY;  Surgeon: Lucilla Lame, MD;  Location: [place];  Service: Endoscopy;  Laterality: N/A;   ROTATOR CUFF REPAIR     left shoulder   SHOULDER ARTHROSCOPY WITH ROTATOR CUFF REPAIR AND OPEN BICEPS  TENODESIS Right 08/07/2019   Procedure: SHOULDER ARTHROSCOPY WITH DEBRIDEMENT, DECOMPRESSION, ROTATOR CUFF REPAIR AND BICEPS TENODESIS. - RNFA;  Surgeon: Corky Mull, MD;  Location: [facility];  Service: Orthopedics;  Laterality: Right;   TOE SURGERY Right    has pin and plate in it    FAMILY HISTORY: Family History  Problem Relation Age of Onset   Breast cancer Cousin 53       bilateral at 39 and 63; daughter of maternal aunt who was unaffected   Lung cancer Maternal Aunt        dx 67s; deceased 33s; smoker   Heart attack Father        deceased 44   Stomach cancer Maternal Grandmother     ADVANCED DIRECTIVES (Y/N):  N  HEALTH MAINTENANCE: Social History   Tobacco Use   Smoking status: Former    Packs/day: 1.00    Years: 30.00    Pack years: 30.00    Types: Cigarettes    Quit date: 09/13/2015    Years since quitting: 6.4   Smokeless tobacco: Never  Vaping Use   Vaping Use: Never used  Substance Use Topics   Alcohol use: Yes    Comment: occasional   Drug use: No     Colonoscopy:  PAP:  Bone density:  Lipid panel:  Allergies  Allergen Reactions   Other     BANDAIDS-OF LEFT ON FOR AN EXTENDED PERIOD OF TIME    Current Outpatient Medications  Medication Sig Dispense Refill   albuterol (VENTOLIN HFA) 108 (90 Base) MCG/ACT inhaler Inhale 1-2 puffs into the lungs every 6 (six) hours as needed for wheezing or shortness of breath. 1 g 1   alendronate (FOSAMAX) 70 MG tablet Take 1 tablet (70 mg total) by mouth once a week. Take with a full glass of water on an empty stomach. Stand or sit upright for 1 hour after taking 12 tablet 1   ALPRAZolam (XANAX) 0.25 MG tablet Take 1 tablet (0.25 mg total) by mouth 2 (two) times daily as needed for anxiety. 60 tablet 1   anastrozole (ARIMIDEX) 1 MG tablet TAKE (1) TABLET BY MOUTH EVERY DAY 90 tablet 1   aspirin-acetaminophen-caffeine (EXCEDRIN MIGRAINE) 011-003-49 MG tablet Take by mouth every 6 (six) hours as needed for headache.      buPROPion (WELLBUTRIN XL) 150 MG 24 hr tablet TAKE (1) TABLET BY MOUTH EVERY DAY 30 tablet 2   cetirizine (ZYRTEC) 10 MG tablet Take 10 mg by mouth daily as needed for allergies.      fluticasone (FLONASE) 50 MCG/ACT nasal spray Place 2 sprays into both nostrils daily as needed for allergies. 60 mL 1   fluticasone-salmeterol (ADVAIR) 250-50 MCG/ACT AEPB Inhale 1 puff into the lungs in the morning and at bedtime. 3 each 1   folic acid (FOLVITE) 611 MCG tablet Take 1,600 mcg by mouth daily.     methotrexate (RHEUMATREX) 2.5 MG tablet Take 20 mg by mouth every Wednesday.      montelukast (SINGULAIR) 10 MG tablet Take 1 tablet (10  mg total) by mouth at bedtime. 90 tablet 1   Omeprazole 20 MG TBEC Take 20 mg by mouth every morning.      potassium chloride (KLOR-CON) 10 MEQ tablet Take 1 tablet (10 mEq total) by mouth daily. 90 tablet 1   sertraline (ZOLOFT) 50 MG tablet Take 1 tablet for 1 week, and then increase to 1.5 tablets daily 45 tablet 3   simvastatin (ZOCOR) 40 MG tablet Take 1 tablet (40 mg total) by mouth at bedtime. 90 tablet 3   traMADol (ULTRAM) 50 MG tablet      triamcinolone cream (KENALOG) 0.1 % Apply 1 application. topically 2 (two) times daily. To affected area. (Patient not taking: Reported on 02/03/2022) 45 g 0   No current facility-administered medications for this visit.    OBJECTIVE: Vitals:   02/03/22 1323  BP: (!) 145/83  Pulse: 80  Resp: 16  Temp: 97.7 F (36.5 C)  SpO2: 95%     Body mass index is 27.54 kg/m.    ECOG FS:0 - Asymptomatic  General: Well-developed, well-nourished, no acute distress. Eyes: Pink conjunctiva, anicteric sclera. HEENT: Normocephalic, moist mucous membranes. Lungs: No audible wheezing or coughing. Heart: Regular rate and rhythm. Abdomen: Soft, nontender, no obvious distention. Musculoskeletal: No edema, cyanosis, or clubbing. Neuro: Alert, answering all questions appropriately. Cranial nerves grossly intact. Skin: No rashes or  petechiae noted. Psych: Normal affect.  LAB RESULTS:  Lab Results  Component Value Date   NA 137 06/09/2021   K 4.6 06/09/2021   CL 102 06/09/2021   CO2 23 06/09/2021   GLUCOSE 81 06/09/2021   BUN 8 06/09/2021   CREATININE 0.77 06/09/2021   CALCIUM 8.7 06/09/2021   PROT 6.5 06/09/2021   ALBUMIN 4.2 06/09/2021   AST 13 06/09/2021   ALT 15 06/09/2021   ALKPHOS 100 06/09/2021   BILITOT 0.3 06/09/2021   GFRNONAA >60 04/09/2020   GFRAA >60 04/09/2020    Lab Results  Component Value Date   WBC 9.2 06/09/2021   NEUTROABS 6.1 06/09/2021   HGB 14.8 06/09/2021   HCT 44.2 06/09/2021   MCV 93 06/09/2021   PLT 255 06/09/2021     STUDIES: NM PET Image Initial (PI) Skull Base To Thigh (F-18 FDG)  Result Date: 01/07/2022 CLINICAL DATA:  Initial treatment strategy for multiple lung nodules. History of right breast cancer. EXAM: NUCLEAR MEDICINE PET SKULL BASE TO THIGH TECHNIQUE: 7.0 mCi F-18 FDG was injected intravenously. Full-ring PET imaging was performed from the skull base to thigh after the radiotracer. CT data was obtained and used for attenuation correction and anatomic localization. Fasting blood glucose: 102 mg/dl COMPARISON:  CT chest abdomen pelvis dated 11/29/2021 FINDINGS: Mediastinal blood pool activity: SUV max 3.0 Liver activity: SUV max NA NECK: No hypermetabolic cervical lymphadenopathy. Incidental CT findings: none CHEST: 2.2 x 1.9 cm spiculated nodule in the anterior right upper lobe (series 7/image 27), abutting the right anterior chest wall, max SUV 16.4. This appearance favors primary bronchogenic neoplasm over metastasis. Bandlike patchy opacity in the anterior left upper lobe (series 7/image 37), max SUV 3.7, new from recent prior and favored to be infectious/inflammatory. Small mediastinal nodes, including a dominant 12 mm short axis low right paratracheal node (series 4/image 60), max SUV 4.5. Postprocedural changes in the lateral right breast with associated 6.4 x  4.5 cm seroma (series 4/image 72), non FDG avid. Incidental CT findings: Atherosclerotic calcifications of the aortic arch. Three vessel coronary atherosclerosis. ABDOMEN/PELVIS: No abnormal hypermetabolism in the liver, spleen, pancreas,  or adrenal glands. No hypermetabolic abdominopelvic lymphadenopathy. Incidental CT findings: 2.3 cm right adrenal adenoma. Atherosclerotic calcifications of the abdominal aorta and branch vessels. Status post hysterectomy. SKELETON: Destructive changes involving the right lateral 7th rib (series 4/image 82), max SUV 4.7, worrisome for pathologic fracture. Incidental CT findings: Cervical spine fixation hardware. Lumbar spine fixation hardware. Mild degenerative changes of the visualized thoracolumbar spine. IMPRESSION: 2.2 cm spiculated nodule in the anterior right upper lobe, favoring primary bronchogenic neoplasm over metastasis. Dominant 12 mm short axis low right paratracheal node, indeterminate. Attention on follow-up is suggested. Destructive changes involving the right lateral 7th rib, worrisome for pathologic fracture. Bandlike opacity in the anterior left upper lobe, favored to be infectious/inflammatory. Postoperative seroma in the right breast, non FDG avid. Benign right adrenal adenoma. Electronically Signed   By: Julian Hy M.D.   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    PLAN:    1.  Right upper lobe pulmonary nodule: CT scan results from December 07, 2021 reviewed independently revealing a 2.2 cm spiculated right upper lobe nodule highly suspicious for underlying malignancy.  PET scan results from January 06, 2022 revealed hypermetabolism in nodule, but no other evidence of disease.  Patient has a CT-guided biopsy scheduled for Feb 08, 2022.  Return to clinic 1 week after the biopsy to discuss the results and treatment planning.   2. DCIS, right breast: Patient underwent lumpectomy  followed by adjuvant XRT completing in September 2019.  Because there was no invasive component on her pathology, she did not require adjuvant chemotherapy. Patient could not tolerate tamoxifen or letrozole secondary to worsening joint pain, therefore was switched to anastrozole.  She is tolerating this well and will complete 5 years of treatment in September 2024.  Her most recent mammogram on April 02, 2021 was reported as normal.  Repeat in July 2023.    3.  Osteopenia: Patient's most recent bone mineral density on March 25, 2021 revealed a T score of -2.3 which is slightly worse than previous where her T score was reported -1.9 and -1.4 and years prior.  Continue calcium, vitamin D, and alendronate.     I spent a total of 60 minutes reviewing chart data, face-to-face evaluation with the patient, counseling and coordination of care as detailed above.      Lloyd Huger, MD 02/04/2022 9:13 AM   Cancer Staging  Ductal carcinoma in situ (DCIS) of right breast Staging form: Breast, AJCC 8th Edition - Clinical: Stage 0 (cTis (DCIS), cN0, cM0, ER+, PR-, HER2-) - Signed by Lloyd Huger, MD on 03/18/2018 Nuclear grade: G3 Laterality: Right

## 2022-02-04 ENCOUNTER — Other Ambulatory Visit: Payer: Self-pay | Admitting: Physician Assistant

## 2022-02-04 DIAGNOSIS — R911 Solitary pulmonary nodule: Secondary | ICD-10-CM | POA: Insufficient documentation

## 2022-02-08 ENCOUNTER — Ambulatory Visit
Admission: RE | Admit: 2022-02-08 | Discharge: 2022-02-08 | Disposition: A | Discharge status: Home / Self Care | Payer: Medicare HMO | Source: Ambulatory Visit | Attending: Internal Medicine | Admitting: Internal Medicine

## 2022-02-08 ENCOUNTER — Ambulatory Visit
Admission: RE | Admit: 2022-02-08 | Discharge: 2022-02-08 | Disposition: A | Discharge status: Home / Self Care | Payer: Medicare HMO | Source: Ambulatory Visit | Attending: Interventional Radiology | Admitting: Interventional Radiology

## 2022-02-08 ENCOUNTER — Other Ambulatory Visit: Payer: Self-pay

## 2022-02-08 DIAGNOSIS — Z803 Family history of malignant neoplasm of breast: Secondary | ICD-10-CM | POA: Diagnosis not present

## 2022-02-08 DIAGNOSIS — Z853 Personal history of malignant neoplasm of breast: Secondary | ICD-10-CM | POA: Insufficient documentation

## 2022-02-08 DIAGNOSIS — C3411 Malignant neoplasm of upper lobe, right bronchus or lung: Secondary | ICD-10-CM | POA: Diagnosis not present

## 2022-02-08 DIAGNOSIS — I252 Old myocardial infarction: Secondary | ICD-10-CM | POA: Insufficient documentation

## 2022-02-08 DIAGNOSIS — R911 Solitary pulmonary nodule: Secondary | ICD-10-CM

## 2022-02-08 DIAGNOSIS — Z8249 Family history of ischemic heart disease and other diseases of the circulatory system: Secondary | ICD-10-CM | POA: Diagnosis not present

## 2022-02-08 DIAGNOSIS — J449 Chronic obstructive pulmonary disease, unspecified: Secondary | ICD-10-CM | POA: Insufficient documentation

## 2022-02-08 DIAGNOSIS — I2510 Atherosclerotic heart disease of native coronary artery without angina pectoris: Secondary | ICD-10-CM | POA: Insufficient documentation

## 2022-02-08 DIAGNOSIS — Z87891 Personal history of nicotine dependence: Secondary | ICD-10-CM | POA: Insufficient documentation

## 2022-02-08 LAB — PROTIME-INR
INR: 1 (ref 0.8–1.2)
Prothrombin Time: 13.4 seconds (ref 11.4–15.2)

## 2022-02-08 LAB — CBC
HCT: 44 % (ref 36.0–46.0)
Hemoglobin: 13.6 g/dL (ref 12.0–15.0)
MCH: 29.4 pg (ref 26.0–34.0)
MCHC: 30.9 g/dL (ref 30.0–36.0)
MCV: 95.2 fL (ref 80.0–100.0)
Platelets: 241 10*3/uL (ref 150–400)
RBC: 4.62 MIL/uL (ref 3.87–5.11)
RDW: 14.4 % (ref 11.5–15.5)
WBC: 6.8 10*3/uL (ref 4.0–10.5)
nRBC: 0 % (ref 0.0–0.2)

## 2022-02-08 MED ORDER — SODIUM CHLORIDE 0.9 % IV SOLN
INTRAVENOUS | Status: DC
Start: 2022-02-08 — End: 2022-02-09

## 2022-02-08 MED ORDER — FENTANYL CITRATE (PF) 100 MCG/2ML IJ SOLN
INTRAMUSCULAR | Status: AC
  Filled 2022-02-08: qty 4

## 2022-02-08 MED ORDER — MIDAZOLAM HCL 2 MG/2ML IJ SOLN
INTRAMUSCULAR | Status: AC | PRN
  Administered 2022-02-08 (×2): .5 mg via INTRAVENOUS
  Administered 2022-02-08: 1 mg via INTRAVENOUS

## 2022-02-08 MED ORDER — MIDAZOLAM HCL 2 MG/2ML IJ SOLN
INTRAMUSCULAR | Status: AC
  Filled 2022-02-08: qty 4

## 2022-02-08 MED ORDER — FENTANYL CITRATE (PF) 100 MCG/2ML IJ SOLN
INTRAMUSCULAR | Status: AC | PRN
  Administered 2022-02-08 (×2): 25 ug via INTRAVENOUS
  Administered 2022-02-08: 50 ug via INTRAVENOUS

## 2022-02-08 NOTE — Procedures (Signed)
Interventional Radiology Procedure Note  Procedure: CT guided core biopsy of RUL pulmonary nodule.  Complications: None  Estimated Blood Loss: None  Recommendations: - CXR in 1 hr - Bedrest x 2 hrs - DC home   Signed,  Criselda Peaches, MD

## 2022-02-08 NOTE — H&P (Signed)
Chief Complaint: Patient was seen in consultation today for lung nodule biopsy   Referring Physician(s): Ethel A  Supervising Physician: Jacqulynn Cadet  Patient Status: [facility] - Out-pt  History of Present Illness: Michelle Moore is a 66 y.o. female with a medical history significant for CAD, MI, COPD and right breast cancer (2019, s/p lumpectomy/radiation). She was recently referred back to Oncology for suspicious pulmonary nodules found during work up for a chronic cough.    CT Chest Nodule Follow up without contrast 12/07/21 IMPRESSION: 1. 1.9 x 1.4 x 2.5 cm irregular spiculated lesion in the anterior right upper lobe, tethered to the anterior pleura. If the patient has had prior radiation to this region, scarring would be a possibility but neoplasm could certainly have this appearance. PET-CT recommended to further evaluate. 2. 9 mm short axis right paratracheal lymph node, nonspecific. 3. 6.5 x 4.1 x 6.6 cm fluid collection in the lateral right breast. In the setting of prior lumpectomy, this could represent a chronic seroma or hematoma. 4. 2.6 cm left thyroid nodule. Recommend thyroid US (ref: J Am Coll Radiol. 2015 Feb;12(2): 143-50). 5. 2.3 cm right adrenal adenoma.  No follow-up recommended. 6. Aortic Atherosclerosis (ICD10-I70.0).  A PET scan on 01/06/22 was positive for a 2.2 cm spiculated nodule in the anterior right upper lobe favoring primary bronchogenic neoplasm over metastases.   Interventional Radiology has been asked to evaluate this patient for an image-guided lung nodule biopsy. Imaging reviewed and procedure approved by Dr. Maryelizabeth Kaufmann  Past Medical History:  Diagnosis Date   Anxiety    Arthritis    Breast cancer ([place])    Cancer ([place])    breast-right    COPD (chronic obstructive pulmonary disease) ([place])    Coronary artery disease    Dyspnea    ONLY WITH EXERTION   GERD (gastroesophageal reflux disease)    "sometimes"   Headache(784.0)     sinus headaches   Myocardial infarction ([place])    in 2011  no damage-1 stent   Personal history of radiation therapy    Seizures ([place]) 2014   X1-no idea what caused her seizure   Sleep apnea    USES CPAP    Past Surgical History:  Procedure Laterality Date   ABDOMINAL HYSTERECTOMY     ANTERIOR CERVICAL DECOMP/DISCECTOMY FUSION N/A 01/20/2014   Procedure: ANTERIOR CERVICAL DECOMPRESSION/DISCECTOMY FUSION 2 LEVELS cervical five/six six Tarry Kos;  Surgeon: Ophelia Charter, MD;  Location: [place] [facility];  Service: Neurosurgery;  Laterality: N/A;   BACK SURGERY     lumbar   BREAST BIOPSY Right 02/01/2018   x shape, DCIS    BREAST BIOPSY  01/16/2019   coil, BENIGN MAMMARY TISSUE WITH MODERATE STROMAL FIBROSIS AND COARSE DYSTROPHIC CALCIFICATIONS of the RIGHT breast   BREAST LUMPECTOMY Right 03/07/2018   Procedure: BREAST LUMPECTOMY/ RE EXCISION;  Surgeon: Herbert Pun, MD;  Location: [facility];  Service: General;  Laterality: Right;   CARDIAC CATHETERIZATION     2011   COLONOSCOPY WITH PROPOFOL N/A 02/11/2021   Procedure: COLONOSCOPY WITH PROPOFOL;  Surgeon: Lucilla Lame, MD;  Location: [place];  Service: Endoscopy;  Laterality: N/A;  Requests Early   CORONARY ANGIOPLASTY WITH STENT PLACEMENT     PLACED IN RCA 2011   EYE SURGERY     lasik   PARTIAL MASTECTOMY WITH NEEDLE LOCALIZATION Right 02/21/2018   Procedure: PARTIAL MASTECTOMY WITH NEEDLE LOCALIZATION;  Surgeon: Herbert Pun, MD;  Location: [facility];  Service: General;  Laterality: Right;  POLYPECTOMY N/A 02/11/2021   Procedure: POLYPECTOMY;  Surgeon: Lucilla Lame, MD;  Location: [place];  Service: Endoscopy;  Laterality: N/A;   ROTATOR CUFF REPAIR     left shoulder   SHOULDER ARTHROSCOPY WITH ROTATOR CUFF REPAIR AND OPEN BICEPS TENODESIS Right 08/07/2019   Procedure: SHOULDER ARTHROSCOPY WITH DEBRIDEMENT, DECOMPRESSION, ROTATOR CUFF REPAIR AND BICEPS TENODESIS. - RNFA;  Surgeon: Corky Mull, MD;   Location: [facility];  Service: Orthopedics;  Laterality: Right;   TOE SURGERY Right    has pin and plate in it    Allergies: Other  Medications: Prior to Admission medications   Medication Sig Start Date End Date Taking? Authorizing Provider  albuterol (VENTOLIN HFA) 108 (90 Base) MCG/ACT inhaler Inhale 1-2 puffs into the lungs every 6 (six) hours as needed for wheezing or shortness of breath. 11/08/21  Yes Danton Clap, PA-C  ALPRAZolam Duanne Moron) 0.25 MG tablet Take 1 tablet (0.25 mg total) by mouth 2 (two) times daily as needed for anxiety. 12/14/21  Yes Abernathy, Yetta Flock, NP  anastrozole (ARIMIDEX) 1 MG tablet TAKE (1) TABLET BY MOUTH EVERY DAY 07/13/21  Yes Verlon Au, NP  buPROPion (WELLBUTRIN XL) 150 MG 24 hr tablet TAKE (1) TABLET BY MOUTH EVERY DAY 08/30/21  Yes McDonough, Lauren K, PA-C  cetirizine (ZYRTEC) 10 MG tablet Take 10 mg by mouth daily as needed for allergies.    Yes [provider]  fluticasone (FLONASE) 50 MCG/ACT nasal spray Place 2 sprays into both nostrils daily as needed for allergies. 09/09/19  Yes Boscia, Greer Ee, NP  fluticasone-salmeterol (ADVAIR) 250-50 MCG/ACT AEPB Inhale 1 puff into the lungs in the morning and at bedtime. 12/08/21  Yes Lavera Guise, MD  folic acid (FOLVITE) 299 MCG tablet Take 1,600 mcg by mouth daily.   Yes [provider]  montelukast (SINGULAIR) 10 MG tablet Take 1 tablet (10 mg total) by mouth at bedtime. 07/15/21  Yes Lavera Guise, MD  Omeprazole 20 MG TBEC Take 20 mg by mouth every morning.    Yes [provider]  potassium chloride (KLOR-CON) 10 MEQ tablet Take 1 tablet (10 mEq total) by mouth daily. 06/03/21  Yes McDonough, Lauren K, PA-C  sertraline (ZOLOFT) 50 MG tablet Take 1 tablet for 1 week, and then increase to 1.5 tablets daily 12/14/21  Yes Abernathy, Alyssa, NP  simvastatin (ZOCOR) 40 MG tablet Take 1 tablet (40 mg total) by mouth at bedtime. 06/03/21  Yes McDonough, Si Gaul, PA-C  traMADol  (ULTRAM) 50 MG tablet  05/21/21  Yes [provider]  alendronate (FOSAMAX) 70 MG tablet Take 1 tablet (70 mg total) by mouth once a week. Take with a full glass of water on an empty stomach. Stand or sit upright for 1 hour after taking 09/17/21   Lloyd Huger, MD  aspirin-acetaminophen-caffeine (EXCEDRIN MIGRAINE) [phone] MG tablet Take by mouth every 6 (six) hours as needed for headache.    [provider]  methotrexate (RHEUMATREX) 2.5 MG tablet Take 20 mg by mouth every Wednesday.  10/02/17   [provider]  triamcinolone cream (KENALOG) 0.1 % Apply 1 application. topically 2 (two) times daily. To affected area. Patient not taking: Reported on 02/03/2022 01/10/22   Jonetta Osgood, NP     Family History  Problem Relation Age of Onset   Breast cancer Cousin 39       bilateral at 59 and 58; daughter of maternal aunt who was unaffected   Lung cancer Maternal Aunt  dx 30s; deceased 37s; smoker   Heart attack Father        deceased 35   Stomach cancer Maternal Grandmother     Social History   Socioeconomic History   Marital status: Married    Spouse name: Not on file   Number of children: Not on file   Years of education: Not on file   Highest education level: Not on file  Occupational History   Not on file  Tobacco Use   Smoking status: Former    Packs/day: 1.00    Years: 30.00    Pack years: 30.00    Types: Cigarettes    Quit date: 09/13/2015    Years since quitting: 6.4   Smokeless tobacco: Never  Vaping Use   Vaping Use: Never used  Substance and Sexual Activity   Alcohol use: Yes    Comment: occasional   Drug use: No   Sexual activity: Not on file  Other Topics Concern   Not on file  Social History Narrative   Not on file   Social Determinants of Health   Financial Resource Strain: Not on file  Food Insecurity: Not on file  Transportation Needs: Not on file  Physical Activity: Not on file  Stress: Not on file  Social  Connections: Not on file    Review of Systems: A 12 point ROS discussed and pertinent positives are indicated in the HPI above.  All other systems are negative.  Review of Systems  Constitutional:  Negative for appetite change and fatigue.  Respiratory:  Negative for cough and shortness of breath.   Cardiovascular:  Negative for chest pain and leg swelling.  Gastrointestinal:  Negative for abdominal pain, diarrhea, nausea and vomiting.  Musculoskeletal:  Positive for neck pain.  Neurological:  Positive for headaches. Negative for dizziness.   Vital Signs: BP (!) 146/78   Pulse 72   Temp 98.3 F (36.8 C) (Oral)   Resp (!) 21   Ht 5' (1.524 m)   Wt 141 lb (64 kg)   SpO2 91%   BMI 27.54 kg/m   Physical Exam Constitutional:      General: She is not in acute distress.    Appearance: She is not ill-appearing.  HENT:     Mouth/Throat:     Mouth: Mucous membranes are moist.     Pharynx: Oropharynx is clear.  Cardiovascular:     Rate and Rhythm: Normal rate and regular rhythm.     Pulses: Normal pulses.     Heart sounds: Normal heart sounds.  Pulmonary:     Effort: Pulmonary effort is normal.     Breath sounds: Normal breath sounds.  Abdominal:     General: Bowel sounds are normal.     Palpations: Abdomen is soft.  Skin:    General: Skin is warm and dry.  Neurological:     Mental Status: She is alert and oriented to person, place, and time.    Imaging: No results found.  Labs:  CBC: Recent Labs    06/09/21 1125 02/08/22 0821  WBC 9.2 6.8  HGB 14.8 13.6  HCT 44.2 44.0  PLT 255 241    COAGS: Recent Labs    02/08/22 0821  INR 1.0    BMP: Recent Labs    06/09/21 1125  NA 137  K 4.6  CL 102  CO2 23  GLUCOSE 81  BUN 8  CALCIUM 8.7  CREATININE 0.77    LIVER FUNCTION TESTS: Recent Labs  06/09/21 1125  BILITOT 0.3  AST 13  ALT 15  ALKPHOS 100  PROT 6.5  ALBUMIN 4.2    TUMOR MARKERS: No results for input(s): AFPTM, CEA, CA199,  CHROMGRNA in the last 8760 hours.  Assessment and Plan:  Right upper lobe lung nodule; concern for primary bronchogenic malignancy: Michelle Moore. Andree Elk, 66 year old female, presents today to the [facility] Interventional Radiology department for an image-guided lung nodule biopsy.   Risks and benefits of CT guided lung nodule biopsy were discussed with the patient including, but not limited to bleeding, hemoptysis, respiratory failure requiring intubation, infection, pneumothorax requiring chest tube placement, stroke from air embolism or even death.  All of the patient's questions were answered and the patient is agreeable to proceed. She has been NPO. She does not take any blood-thinning medications.   Consent signed and in chart.   Thank you for this interesting consult.  I greatly enjoyed meeting Michelle Moore and look forward to participating in their care.  A copy of this report was sent to the requesting provider on this date.  Electronically Signed: Soyla Dryer, AGACNP-BC [phone] 02/08/2022, 8:46 AM   I spent a total of  30 Minutes   in face to face in clinical consultation, greater than 50% of which was counseling/coordinating care for lung nodule biopsy

## 2022-02-10 ENCOUNTER — Other Ambulatory Visit: Payer: Self-pay | Admitting: Anatomic Pathology & Clinical Pathology

## 2022-02-10 LAB — SURGICAL PATHOLOGY

## 2022-02-11 ENCOUNTER — Other Ambulatory Visit: Payer: Self-pay

## 2022-02-11 DIAGNOSIS — N368 Other specified disorders of urethra: Secondary | ICD-10-CM

## 2022-02-13 ENCOUNTER — Encounter: Payer: Self-pay | Admitting: Nurse Practitioner

## 2022-02-14 ENCOUNTER — Encounter: Payer: Self-pay | Admitting: Physician Assistant

## 2022-02-14 ENCOUNTER — Ambulatory Visit (INDEPENDENT_AMBULATORY_CARE_PROVIDER_SITE_OTHER): Payer: Medicare HMO | Admitting: Physician Assistant

## 2022-02-14 VITALS — BP 144/84 | HR 87 | Temp 98.0°F | Resp 16 | Ht 60.0 in | Wt 141.0 lb

## 2022-02-14 DIAGNOSIS — F411 Generalized anxiety disorder: Secondary | ICD-10-CM | POA: Diagnosis not present

## 2022-02-14 DIAGNOSIS — D0511 Intraductal carcinoma in situ of right breast: Secondary | ICD-10-CM | POA: Diagnosis not present

## 2022-02-14 DIAGNOSIS — R911 Solitary pulmonary nodule: Secondary | ICD-10-CM | POA: Diagnosis not present

## 2022-02-14 DIAGNOSIS — F320 Major depressive disorder, single episode, mild: Secondary | ICD-10-CM

## 2022-02-14 DIAGNOSIS — I10 Essential (primary) hypertension: Secondary | ICD-10-CM

## 2022-02-14 DIAGNOSIS — N368 Other specified disorders of urethra: Secondary | ICD-10-CM

## 2022-02-14 MED ORDER — OMEPRAZOLE 40 MG PO CPDR: 40.0000 mg | DELAYED_RELEASE_CAPSULE | Freq: Every day | ORAL | 3 refills | Status: DC

## 2022-02-14 MED ORDER — SERTRALINE HCL 100 MG PO TABS: 100.0000 mg | ORAL_TABLET | Freq: Every day | ORAL | 3 refills | Status: DC

## 2022-02-14 NOTE — Progress Notes (Unsigned)
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    INTERVAL HISTORY: Patient was last evaluated in clinic in December 2021.  She is referred back for new suspicious pulmonary nodule.  She had recent imaging done secondary to chronic cough and was found to have a right upper lobe pulmonary nodule highly suspicious for underlying malignancy.  She currently feels well and is asymptomatic.  She has no neurologic complaints.  She denies any recent fevers or illnesses.  She has a good appetite and denies weight loss.  She denies any chest pain, shortness of breath, cough, or hemoptysis.  She denies any nausea, vomiting, constipation, or diarrhea. She has no urinary complaints.  Patient offers no further specific complaints today.  REVIEW OF SYSTEMS:   Review of Systems  Constitutional: Negative.  Negative for fever, malaise/fatigue and weight loss.  Respiratory: Negative.  Negative for cough, hemoptysis and shortness of breath.   Cardiovascular: Negative.  Negative for chest pain and leg swelling.  Gastrointestinal: Negative.  Negative for abdominal pain.  Genitourinary: Negative.  Negative for dysuria.  Musculoskeletal: Negative.  Negative for back pain and myalgias.  Skin: Negative.  Negative for rash.  Neurological: Negative.  Negative for dizziness, sensory change, focal weakness and weakness.  Psychiatric/Behavioral: Negative.  The patient is not nervous/anxious.    As per HPI. Otherwise, a complete review of systems is negative.  PAST MEDICAL HISTORY: Past Medical History:  Diagnosis Date   Anxiety    Arthritis    Breast cancer ([place])    Cancer ([place])     breast-right    COPD (chronic obstructive pulmonary disease) ([place])    Coronary artery disease    Dyspnea    ONLY WITH EXERTION   GERD (gastroesophageal reflux disease)    "sometimes"   Headache(784.0)    sinus headaches   Myocardial infarction ([place])    in 2011  no damage-1 stent   Personal history of radiation therapy    Seizures ([place]) 2014   X1-no idea what caused her seizure   Sleep apnea    USES CPAP    PAST SURGICAL HISTORY: Past Surgical History:  Procedure Laterality Date   ABDOMINAL HYSTERECTOMY     ANTERIOR CERVICAL DECOMP/DISCECTOMY FUSION N/A 01/20/2014   Procedure: ANTERIOR CERVICAL DECOMPRESSION/DISCECTOMY FUSION 2 LEVELS cervical five/six six [place];  Surgeon: Ophelia Charter, MD;  Location: [facility];  Service: Neurosurgery;  Laterality: N/A;   BACK SURGERY     lumbar   BREAST BIOPSY Right 02/01/2018   x shape, DCIS    BREAST BIOPSY  01/16/2019   coil, BENIGN MAMMARY TISSUE WITH MODERATE STROMAL FIBROSIS AND COARSE DYSTROPHIC CALCIFICATIONS of the RIGHT breast   BREAST LUMPECTOMY Right 03/07/2018   Procedure: BREAST LUMPECTOMY/ RE EXCISION;  Surgeon: Herbert Pun, MD;  Location: [facility];  Service: General;  Laterality: Right;   CARDIAC CATHETERIZATION     2011   COLONOSCOPY WITH PROPOFOL N/A 02/11/2021   Procedure: COLONOSCOPY WITH PROPOFOL;  Surgeon: Lucilla Lame, MD;  Location: [facility];  Service: Endoscopy;  Laterality: N/A;  Requests Early   CORONARY ANGIOPLASTY WITH STENT PLACEMENT     PLACED IN R[facility] 2011   EYE SURGERY  lasik   PARTIAL MASTECTOMY WITH NEEDLE LOCALIZATION Right 02/21/2018   Procedure: PARTIAL MASTECTOMY WITH NEEDLE LOCALIZATION;  Surgeon: Herbert Pun, MD;  Location: [facility];  Service: General;  Laterality: Right;   POLYPECTOMY N/A 02/11/2021   Procedure: POLYPECTOMY;  Surgeon: Lucilla Lame, MD;  Location: [facility];  Service: Endoscopy;  Laterality: N/A;   ROTATOR CUFF REPAIR     left shoulder    SHOULDER ARTHROSCOPY WITH ROTATOR CUFF REPAIR AND OPEN BICEPS TENODESIS Right 08/07/2019   Procedure: SHOULDER ARTHROSCOPY WITH DEBRIDEMENT, DECOMPRESSION, ROTATOR CUFF REPAIR AND BICEPS TENODESIS. - RNFA;  Surgeon: Corky Mull, MD;  Location: [facility];  Service: Orthopedics;  Laterality: Right;   TOE SURGERY Right    has pin and plate in it    FAMILY HISTORY: Family History  Problem Relation Age of Onset   Breast cancer Cousin 75       bilateral at 35 and 72; daughter of maternal aunt who was unaffected   Lung cancer Maternal Aunt        dx 70s; deceased 89s; smoker   Heart attack Father        deceased 48   Stomach cancer Maternal Grandmother     ADVANCED DIRECTIVES (Y/N):  N  HEALTH MAINTENANCE: Social History   Tobacco Use   Smoking status: Former    Packs/day: 1.00    Years: 30.00    Pack years: 30.00    Types: Cigarettes    Quit date: 09/13/2015    Years since quitting: 6.4   Smokeless tobacco: Never  Vaping Use   Vaping Use: Never used  Substance Use Topics   Alcohol use: Yes    Comment: occasional   Drug use: No     Colonoscopy:  PAP:  Bone density:  Lipid panel:  Allergies  Allergen Reactions   Other     BANDAIDS-OF LEFT ON FOR AN EXTENDED PERIOD OF TIME    Current Outpatient Medications  Medication Sig Dispense Refill   albuterol (VENTOLIN HFA) 108 (90 Base) MCG/ACT inhaler Inhale 1-2 puffs into the lungs every 6 (six) hours as needed for wheezing or shortness of breath. 1 g 1   alendronate (FOSAMAX) 70 MG tablet Take 1 tablet (70 mg total) by mouth once a week. Take with a full glass of water on an empty stomach. Stand or sit upright for 1 hour after taking 12 tablet 1   ALPRAZolam (XANAX) 0.25 MG tablet Take 1 tablet (0.25 mg total) by mouth 2 (two) times daily as needed for anxiety. 60 tablet 1   anastrozole (ARIMIDEX) 1 MG tablet TAKE (1) TABLET BY MOUTH EVERY DAY 90 tablet 1   aspirin-acetaminophen-caffeine (EXCEDRIN MIGRAINE) 500-938-18 MG  tablet Take by mouth every 6 (six) hours as needed for headache.     buPROPion (WELLBUTRIN XL) 150 MG 24 hr tablet TAKE (1) TABLET BY MOUTH EVERY DAY 30 tablet 2   cetirizine (ZYRTEC) 10 MG tablet Take 10 mg by mouth daily as needed for allergies.      fluticasone (FLONASE) 50 MCG/ACT nasal spray Place 2 sprays into both nostrils daily as needed for allergies. 60 mL 1   fluticasone-salmeterol (ADVAIR) 250-50 MCG/ACT AEPB Inhale 1 puff into the lungs in the morning and at bedtime. 3 each 1   folic acid (FOLVITE) 299 MCG tablet Take 1,600 mcg by mouth daily.     methotrexate (RHEUMATREX) 2.5 MG tablet Take 20 mg by mouth every Wednesday.      montelukast (SINGULAIR) 10 MG  tablet Take 1 tablet (10 mg total) by mouth at bedtime. 90 tablet 1   omeprazole (PRILOSEC) 40 MG capsule Take 1 capsule (40 mg total) by mouth daily. 30 capsule 3   potassium chloride (KLOR-CON) 10 MEQ tablet Take 1 tablet (10 mEq total) by mouth daily. 90 tablet 1   sertraline (ZOLOFT) 100 MG tablet Take 1 tablet (100 mg total) by mouth daily. 30 tablet 3   simvastatin (ZOCOR) 40 MG tablet Take 1 tablet (40 mg total) by mouth at bedtime. 90 tablet 3   traMADol (ULTRAM) 50 MG tablet      triamcinolone cream (KENALOG) 0.1 % Apply 1 application. topically 2 (two) times daily. To affected area. 45 g 0   No current facility-administered medications for this visit.    OBJECTIVE: There were no vitals filed for this visit.    There is no height or weight on file to calculate BMI.    ECOG FS:0 - Asymptomatic  General: Well-developed, well-nourished, no acute distress. Eyes: Pink conjunctiva, anicteric sclera. HEENT: Normocephalic, moist mucous membranes. Lungs: No audible wheezing or coughing. Heart: Regular rate and rhythm. Abdomen: Soft, nontender, no obvious distention. Musculoskeletal: No edema, cyanosis, or clubbing. Neuro: Alert, answering all questions appropriately. Cranial nerves grossly intact. Skin: No rashes or  petechiae noted. Psych: Normal affect.  LAB RESULTS:  Lab Results  Component Value Date   NA 137 06/09/2021   K 4.6 06/09/2021   CL 102 06/09/2021   CO2 23 06/09/2021   GLUCOSE 81 06/09/2021   BUN 8 06/09/2021   CREATININE 0.77 06/09/2021   CALCIUM 8.7 06/09/2021   PROT 6.5 06/09/2021   ALBUMIN 4.2 06/09/2021   AST 13 06/09/2021   ALT 15 06/09/2021   ALKPHOS 100 06/09/2021   BILITOT 0.3 06/09/2021   GFRNONAA >60 04/09/2020   GFRAA >60 04/09/2020    Lab Results  Component Value Date   WBC 6.8 02/08/2022   NEUTROABS 6.1 06/09/2021   HGB 13.6 02/08/2022   HCT 44.0 02/08/2022   MCV 95.2 02/08/2022   PLT 241 02/08/2022     STUDIES: DG Chest Port 1 View  Result Date: 02/08/2022 CLINICAL DATA:  Status post biopsy of right lung nodule EXAM: PORTABLE CHEST 1 VIEW COMPARISON:  Previous studies including the PET-CT done on 01/06/2022 FINDINGS: Cardiac size is within normal limits. There is an ill-defined 2.3 cm nodule in the right upper lobe. There is no pleural effusion or pneumothorax. There is surgical fusion in the lower cervical spine. IMPRESSION: There is 2.3 cm faint nodular density in the right upper lobe possibly neoplastic process seen in the PET-CT. There is no pleural effusion or pneumothorax. Electronically Signed   By: Elmer Picker M.D.   On: 02/08/2022 11:23   CT LUNG MASS BIOPSY  Result Date: 02/08/2022 INDICATION: 66 year old female with a history of breast cancer and new hypermetabolic nodule in the right anterior upper lobe. Considerations include metastatic breast cancer versus metachronous primary bronchogenic carcinoma. She presents for CT-guided biopsy of the same. EXAM: CT-guided biopsy right upper lobe lobe pulmonary nodule Interventional Radiologist:  Criselda Peaches, MD MEDICATIONS: None. ANESTHESIA/SEDATION: Fentanyl 100 mcg IV; Versed 2 mg IV Moderate Sedation Time:  20 minutes The patient's vital signs and level of consciousness were  continuously monitored during the procedure by the interventional radiology nurse under my direct supervision. FLUOROSCOPY: None. COMPLICATIONS: None immediate. Estimated blood loss:  0 PROCEDURE: Informed written consent was obtained from the patient after a thorough discussion of the procedural risks,  benefits and alternatives. All questions were addressed. Maximal Sterile Barrier Technique was utilized including caps, mask, sterile gowns, sterile gloves, sterile drape, hand hygiene and skin antiseptic. A timeout was performed prior to the initiation of the procedure. A planning axial CT scan was performed. The nodule in the right upper lobe was successfully identified. A suitable skin entry site was selected and marked. The region was then sterilely prepped and draped in standard fashion using Betadine skin prep. Local anesthesia was attained by infiltration with 1% lidocaine. A small dermatotomy was made. Under intermittent CT fluoroscopic guidance, a 17 gauge trocar needle was advanced into the lung and positioned at the margin of the nodule. Multiple 18 gauge core biopsies were then coaxially obtained using the BioPince automated biopsy device. Biopsy specimens were placed in formalin and delivered to pathology for further analysis. The biopsy device and introducer needle were removed. Post biopsy axial CT imaging demonstrates no evidence of immediate complication. There is no pneumothorax. Mild perilesional alveolar hemorrhage is not unexpected. The patient tolerated the procedure well. IMPRESSION: Technically successful CT-guided biopsy right upper lobe pulmonary nodule. RADIATION DOSE REDUCTION: This exam was performed according to the departmental dose-optimization program which includes automated exposure control, adjustment of the mA and/or kV according to patient size and/or use of iterative reconstruction technique. Electronically Signed   By: Jacqulynn Cadet M.D.   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    PLAN:    1.  Right upper lobe pulmonary nodule: CT scan results from December 07, 2021 reviewed independently revealing a 2.2 cm spiculated right upper lobe nodule highly suspicious for underlying malignancy.  PET scan results from January 06, 2022 revealed hypermetabolism in nodule, but no other evidence of disease.  Patient has a CT-guided biopsy scheduled for Feb 08, 2022.  Return to clinic 1 week after the biopsy to discuss the results and treatment planning.   2. DCIS, right breast: Patient underwent lumpectomy followed by adjuvant XRT completing in September 2019.  Because there was no invasive component on her pathology, she did not require adjuvant chemotherapy. Patient could not tolerate tamoxifen or letrozole secondary to worsening joint pain, therefore was switched to anastrozole.  She is tolerating this well and will complete 5 years of treatment in September 2024.  Her most recent mammogram on April 02, 2021 was reported as normal.  Repeat in July 2023.    3.  Osteopenia: Patient's most recent bone mineral density on March 25, 2021 revealed a T score of -2.3 which is slightly worse than previous where her T score was reported -1.9 and -1.4 and years prior.  Continue calcium, vitamin D, and alendronate.     I spent a total of 60 minutes reviewing chart data, face-to-face evaluation with the patient, counseling and coordination of care as detailed above.      Lloyd Huger, MD 02/14/2022 9:56 AM   Cancer Staging  Ductal carcinoma in situ (DCIS) of right breast Staging form: Breast, AJCC 8th Edition - Clinical: Stage 0 (cTis (DCIS), cN0, cM0, ER+, PR-, HER2-) - Signed by Lloyd Huger, MD on 03/18/2018 Nuclear grade: G3 Laterality: Right

## 2022-02-14 NOTE — Progress Notes (Signed)
[facility] [place], [place] [address]  Internal MEDICINE  Office Visit Note  Patient Name: Michelle Moore  [id number]  [id number]  Date of Service: 02/22/2022  Chief Complaint  Patient presents with   Follow-up   Gastroesophageal Reflux    HPI Pt is here for routine follow up -Hands have been bothersome. No numbness or tingling. Some locking of fingers. Takes 2 tramadol and 2 exedrine. Has scheduled visit with rheumatology to discuss further. -stomach will start hurting at times. Some heart burn at times and discussed increasing omeprazole -She does have a visit with oncology tomorrow to discuss recent lung mass biopsy. She is anxious, but states she is comfortable with her oncologist as she was previously treated by Dr. Grayland Ormond during her breast cancer diagnosis and knows he will have a plan for her -She also will be seeing urology for visit due to previously discovered nodular enhancing focus at base of urinary bladder on a CT of abdomen/pelvis. Mentions she is considering cancelling appointment as nothing about the bladder was mentioned on PET scan, however discussed the importance of proceeding with visit for further evaluation and she is agreeable and will go for appt -She has been taking 1.5 tabs of zoloft and will increase to 10mg  now  Current Medication: Outpatient Encounter Medications as of 02/14/2022  Medication Sig   albuterol (VENTOLIN HFA) 108 (90 Base) MCG/ACT inhaler Inhale 1-2 puffs into the lungs every 6 (six) hours as needed for wheezing or shortness of breath.   alendronate (FOSAMAX) 70 MG tablet Take 1 tablet (70 mg total) by mouth once a week. Take with a full glass of water on an empty stomach. Stand or sit upright for 1 hour after taking   ALPRAZolam (XANAX) 0.25 MG tablet Take 1 tablet (0.25 mg total) by mouth 2 (two) times daily as needed for anxiety.   anastrozole (ARIMIDEX) 1 MG tablet TAKE (1) TABLET BY MOUTH EVERY DAY    aspirin-acetaminophen-caffeine (EXCEDRIN MIGRAINE) 751-025-85 MG tablet Take by mouth every 6 (six) hours as needed for headache.   buPROPion (WELLBUTRIN XL) 150 MG 24 hr tablet TAKE (1) TABLET BY MOUTH EVERY DAY   cetirizine (ZYRTEC) 10 MG tablet Take 10 mg by mouth daily as needed for allergies.    fluticasone (FLONASE) 50 MCG/ACT nasal spray Place 2 sprays into both nostrils daily as needed for allergies.   methotrexate (RHEUMATREX) 2.5 MG tablet Take 20 mg by mouth every Wednesday.    montelukast (SINGULAIR) 10 MG tablet Take 1 tablet (10 mg total) by mouth at bedtime.   omeprazole (PRILOSEC) 40 MG capsule Take 1 capsule (40 mg total) by mouth daily.   potassium chloride (KLOR-CON) 10 MEQ tablet Take 1 tablet (10 mEq total) by mouth daily.   sertraline (ZOLOFT) 100 MG tablet Take 1 tablet (100 mg total) by mouth daily.   simvastatin (ZOCOR) 40 MG tablet Take 1 tablet (40 mg total) by mouth at bedtime.   traMADol (ULTRAM) 50 MG tablet    triamcinolone cream (KENALOG) 0.1 % Apply 1 application. topically 2 (two) times daily. To affected area.   [DISCONTINUED] fluticasone-salmeterol (ADVAIR) 250-50 MCG/ACT AEPB Inhale 1 puff into the lungs in the morning and at bedtime.   [DISCONTINUED] folic acid (FOLVITE) 277 MCG tablet Take 1,600 mcg by mouth daily.   [DISCONTINUED] Omeprazole 20 MG TBEC Take 20 mg by mouth every morning.    [DISCONTINUED] sertraline (ZOLOFT) 50 MG tablet Take 1 tablet for 1 week, and then increase  to 1.5 tablets daily   No facility-administered encounter medications on file as of 02/14/2022.    Surgical History: Past Surgical History:  Procedure Laterality Date   ABDOMINAL HYSTERECTOMY     ANTERIOR CERVICAL DECOMP/DISCECTOMY FUSION N/A 01/20/2014   Procedure: ANTERIOR CERVICAL DECOMPRESSION/DISCECTOMY FUSION 2 LEVELS cervical five/six six Tarry Kos;  Surgeon: Ophelia Charter, MD;  Location: [facility];  Service: Neurosurgery;  Laterality: N/A;   BACK SURGERY     lumbar    BREAST BIOPSY Right 02/01/2018   x shape, DCIS    BREAST BIOPSY  01/16/2019   coil, BENIGN MAMMARY TISSUE WITH MODERATE STROMAL FIBROSIS AND COARSE DYSTROPHIC CALCIFICATIONS of the RIGHT breast   BREAST LUMPECTOMY Right 03/07/2018   Procedure: BREAST LUMPECTOMY/ RE EXCISION;  Surgeon: Herbert Pun, MD;  Location: [facility];  Service: General;  Laterality: Right;   CARDIAC CATHETERIZATION     2011   COLONOSCOPY WITH PROPOFOL N/A 02/11/2021   Procedure: COLONOSCOPY WITH PROPOFOL;  Surgeon: Lucilla Lame, MD;  Location: [place];  Service: Endoscopy;  Laterality: N/A;  Requests Early   CORONARY ANGIOPLASTY WITH STENT PLACEMENT     PLACED IN RCA 2011   EYE SURGERY     lasik   PARTIAL MASTECTOMY WITH NEEDLE LOCALIZATION Right 02/21/2018   Procedure: PARTIAL MASTECTOMY WITH NEEDLE LOCALIZATION;  Surgeon: Herbert Pun, MD;  Location: [facility];  Service: General;  Laterality: Right;   POLYPECTOMY N/A 02/11/2021   Procedure: POLYPECTOMY;  Surgeon: Lucilla Lame, MD;  Location: [place] [facility];  Service: Endoscopy;  Laterality: N/A;   ROTATOR CUFF REPAIR     left shoulder   SHOULDER ARTHROSCOPY WITH ROTATOR CUFF REPAIR AND OPEN BICEPS TENODESIS Right 08/07/2019   Procedure: SHOULDER ARTHROSCOPY WITH DEBRIDEMENT, DECOMPRESSION, ROTATOR CUFF REPAIR AND BICEPS TENODESIS. - RNFA;  Surgeon: Corky Mull, MD;  Location: [facility];  Service: Orthopedics;  Laterality: Right;   TOE SURGERY Right    has pin and plate in it    Medical History: Past Medical History:  Diagnosis Date   Anxiety    Arthritis    Breast cancer ([place])    Cancer ([place])    breast-right    COPD (chronic obstructive pulmonary disease) ([place])    Coronary artery disease    Dyspnea    ONLY WITH EXERTION   GERD (gastroesophageal reflux disease)    "sometimes"   Headache(784.0)    sinus headaches   Myocardial infarction ([place])    in 2011  no damage-1 stent   Personal history of radiation therapy     Seizures ([place]) 2014   X1-no idea what caused her seizure   Sleep apnea    USES CPAP    Family History: Family History  Problem Relation Age of Onset   Breast cancer Cousin 3       bilateral at 20 and 8; daughter of maternal aunt who was unaffected   Lung cancer Maternal Aunt        dx 32s; deceased 72s; smoker   Heart attack Father        deceased 38   Stomach cancer Maternal Grandmother     Social History   Socioeconomic History   Marital status: Married    Spouse name: Not on file   Number of children: Not on file   Years of education: Not on file   Highest education level: Not on file  Occupational History   Not on file  Tobacco Use   Smoking status: Former  Packs/day: 1.00    Years: 30.00    Total pack years: 30.00    Types: Cigarettes    Quit date: 09/13/2015    Years since quitting: 6.4   Smokeless tobacco: Never  Vaping Use   Vaping Use: Never used  Substance and Sexual Activity   Alcohol use: Yes    Comment: occasional   Drug use: No   Sexual activity: Not on file  Other Topics Concern   Not on file  Social History Narrative   Not on file   Social Determinants of Health   Financial Resource Strain: Not on file  Food Insecurity: Not on file  Transportation Needs: Not on file  Physical Activity: Not on file  Stress: Not on file  Social Connections: Not on file  Intimate Partner Violence: Not on file      Review of Systems  Constitutional:  Negative for chills, fatigue and unexpected weight change.  HENT:  Negative for congestion, rhinorrhea, sneezing and sore throat.   Eyes:  Negative for redness.  Respiratory:  Negative for cough, chest tightness and shortness of breath.   Cardiovascular:  Negative for chest pain and palpitations.  Gastrointestinal:  Negative for abdominal pain, constipation, diarrhea, nausea and vomiting.  Genitourinary:  Negative for dysuria and frequency.  Musculoskeletal:  Negative for arthralgias, back pain, joint  swelling and neck pain.  Skin:  Negative for rash.  Neurological: Negative.  Negative for tremors and numbness.  Hematological:  Negative for adenopathy. Does not bruise/bleed easily.  Psychiatric/Behavioral:  Positive for behavioral problems (Depression). Negative for self-injury, sleep disturbance and suicidal ideas. The patient is nervous/anxious.     Vital Signs: BP (!) 144/84   Pulse 87   Temp 98 F (36.7 C)   Resp 16   Ht 5' (1.524 m)   Wt 141 lb (64 kg)   SpO2 92%   BMI 27.54 kg/m    Physical Exam Vitals and nursing note reviewed.  Constitutional:      General: She is not in acute distress.    Appearance: She is well-developed. She is not diaphoretic.  HENT:     Head: Normocephalic and atraumatic.     Mouth/Throat:     Pharynx: No oropharyngeal exudate.  Eyes:     Pupils: Pupils are equal, round, and reactive to light.  Neck:     Thyroid: No thyromegaly.     Vascular: No JVD.     Trachea: No tracheal deviation.  Cardiovascular:     Rate and Rhythm: Normal rate and regular rhythm.     Heart sounds: Normal heart sounds. No murmur heard.    No friction rub. No gallop.  Pulmonary:     Effort: Pulmonary effort is normal. No respiratory distress.     Breath sounds: No wheezing or rales.  Chest:     Chest wall: No tenderness.  Abdominal:     General: Bowel sounds are normal.     Palpations: Abdomen is soft.     Tenderness: There is no abdominal tenderness.  Musculoskeletal:        General: Normal range of motion.     Cervical back: Normal range of motion and neck supple.  Lymphadenopathy:     Cervical: No cervical adenopathy.  Skin:    General: Skin is warm and dry.  Neurological:     Mental Status: She is alert and oriented to person, place, and time.     Cranial Nerves: No cranial nerve deficit.  Psychiatric:  Thought Content: Thought content normal.        Judgment: Judgment normal.     Comments: Anxious in office         Assessment/Plan: 1. Essential hypertension Borderline elevated likely due to increased anxiety and will monitor  2. Generalized anxiety disorder Will increase to 100mg  zoloft  3. Depression, major, single episode, mild (HCC) Will increase to 100mg  zoloft  4. Mass of urethra Will have visit with urology  5. 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 I have discussed any further diagnostic evaluation that may be needed or ordered today. We also reviewed her medications today. she has been encouraged to call the office with any questions or concerns that should arise related to todays visit.    No orders of the defined types were placed in this encounter.   Meds ordered this encounter  Medications   sertraline (ZOLOFT) 100 MG tablet    Sig: Take 1 tablet (100 mg total) by mouth daily.    Dispense:  30 tablet    Refill:  3   omeprazole (PRILOSEC) 40 MG capsule    Sig: Take 1 capsule (40 mg total) by mouth daily.    Dispense:  30 capsule    Refill:  3    This patient was seen by Drema Dallas, PA-C in collaboration with Dr. Clayborn Bigness as a part of collaborative care agreement.   Total time spent:30 Minutes Time spent includes review of chart, medications, test results, and follow up plan with the patient.      Dr Lavera Guise Internal medicine

## 2022-02-15 ENCOUNTER — Inpatient Hospital Stay: Payer: Medicare HMO | Attending: Oncology | Admitting: Oncology

## 2022-02-15 ENCOUNTER — Encounter: Payer: Self-pay | Admitting: *Deleted

## 2022-02-15 ENCOUNTER — Encounter: Payer: Self-pay | Admitting: Oncology

## 2022-02-15 VITALS — BP 127/79 | HR 78 | Temp 97.8°F | Resp 16 | Ht 60.0 in | Wt 139.8 lb

## 2022-02-15 DIAGNOSIS — C3491 Malignant neoplasm of unspecified part of right bronchus or lung: Secondary | ICD-10-CM | POA: Diagnosis not present

## 2022-02-15 DIAGNOSIS — C3411 Malignant neoplasm of upper lobe, right bronchus or lung: Secondary | ICD-10-CM | POA: Diagnosis not present

## 2022-02-15 DIAGNOSIS — Z1231 Encounter for screening mammogram for malignant neoplasm of breast: Secondary | ICD-10-CM | POA: Diagnosis not present

## 2022-02-15 DIAGNOSIS — Z923 Personal history of irradiation: Secondary | ICD-10-CM | POA: Insufficient documentation

## 2022-02-15 DIAGNOSIS — D0511 Intraductal carcinoma in situ of right breast: Secondary | ICD-10-CM | POA: Insufficient documentation

## 2022-02-15 DIAGNOSIS — R911 Solitary pulmonary nodule: Secondary | ICD-10-CM | POA: Diagnosis not present

## 2022-02-15 DIAGNOSIS — Z170 Estrogen receptor positive status [ER+]: Secondary | ICD-10-CM | POA: Diagnosis not present

## 2022-02-15 DIAGNOSIS — Z79811 Long term (current) use of aromatase inhibitors: Secondary | ICD-10-CM | POA: Insufficient documentation

## 2022-02-15 NOTE — Progress Notes (Signed)
Met with patient during follow up visit with Dr. Grayland Ormond to discuss biopsy results and treatment options. All questions answered during visit. Pt informed that referral will be placed for thoracic surgery and their office will call her to schedule initial appt. Contact info given and instructed to call with any questions or needs. Pt verbalized understanding.

## 2022-02-17 ENCOUNTER — Other Ambulatory Visit: Payer: Medicare HMO

## 2022-02-17 DIAGNOSIS — Z79899 Other long term (current) drug therapy: Secondary | ICD-10-CM | POA: Diagnosis not present

## 2022-02-17 DIAGNOSIS — M19041 Primary osteoarthritis, right hand: Secondary | ICD-10-CM | POA: Diagnosis not present

## 2022-02-17 DIAGNOSIS — M19042 Primary osteoarthritis, left hand: Secondary | ICD-10-CM | POA: Diagnosis not present

## 2022-02-17 DIAGNOSIS — M0579 Rheumatoid arthritis with rheumatoid factor of multiple sites without organ or systems involvement: Secondary | ICD-10-CM | POA: Diagnosis not present

## 2022-02-17 DIAGNOSIS — C3490 Malignant neoplasm of unspecified part of unspecified bronchus or lung: Secondary | ICD-10-CM | POA: Diagnosis not present

## 2022-02-17 NOTE — Progress Notes (Signed)
Tumor Board Documentation  MARLAINE AREY was presented by Norvel Richards, RN at our Tumor Board on 02/17/2022, which included representatives from medical oncology, surgical, pharmacy, pulmonology, genetics, radiology, pathology, research, navigation, radiation oncology, internal medicine, palliative care.  Michelle Moore currently presents as a current patient, for [place], for new positive pathology with history of the following treatments: active survellience.  Additionally, we reviewed previous medical and familial history, history of present illness, and recent lab results along with all available histopathologic and imaging studies. The tumor board considered available treatment options and made the following recommendations: Surgery (vs EBUS) WIll see Dr Erlene Quan 02/18/22 for Ureteral mass  The following procedures/referrals were also placed: No orders of the defined types were placed in this encounter.   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  Tumor board is a meeting of clinicians from various specialty areas who evaluate and discuss patients for whom a multidisciplinary approach is being considered. Final determinations in the plan of care are those of the provider(s). The responsibility for follow up of recommendations given during tumor board is that of the provider.   Today's extended care, comprehensive team conference, Verble was not present for the discussion and was not examined.   Multidisciplinary Tumor Board is a multidisciplinary case peer review process.  Decisions discussed in the Multidisciplinary Tumor Board reflect the opinions of the specialists present at the conference without having examined the patient.  Ultimately, treatment and diagnostic decisions rest with the primary  provider(s) and the patient.

## 2022-02-18 ENCOUNTER — Ambulatory Visit: Payer: Medicare HMO | Admitting: Urology

## 2022-02-18 ENCOUNTER — Other Ambulatory Visit: Payer: Self-pay | Admitting: Urology

## 2022-02-18 ENCOUNTER — Other Ambulatory Visit
Admission: RE | Admit: 2022-02-18 | Discharge: 2022-02-18 | Disposition: A | Discharge status: Home / Self Care | Payer: Medicare HMO | Attending: Urology | Admitting: Urology

## 2022-02-18 VITALS — BP 129/82 | HR 85 | Ht 60.0 in | Wt 140.0 lb

## 2022-02-18 DIAGNOSIS — N368 Other specified disorders of urethra: Secondary | ICD-10-CM

## 2022-02-18 DIAGNOSIS — N3289 Other specified disorders of bladder: Secondary | ICD-10-CM

## 2022-02-18 DIAGNOSIS — D494 Neoplasm of unspecified behavior of bladder: Secondary | ICD-10-CM

## 2022-02-18 LAB — URINALYSIS, COMPLETE (UACMP) WITH MICROSCOPIC
Bilirubin Urine: NEGATIVE
Glucose, UA: NEGATIVE mg/dL
Ketones, ur: NEGATIVE mg/dL
Leukocytes,Ua: NEGATIVE
Nitrite: NEGATIVE
Protein, ur: NEGATIVE mg/dL
Specific Gravity, Urine: 1.015 (ref 1.005–1.030)
pH: 6 (ref 5.0–8.0)

## 2022-02-18 NOTE — Patient Instructions (Signed)
Transurethral Resection of Bladder Tumor  Transurethral resection of a bladder tumor is the removal (resection) of cancerous tissue (tumor) from the inside wall of the bladder. The bladder is the organ that holds urine. The tumor is removed through the tube that carries urine out of the body (urethra). In a transurethral resection, a thin telescope with a light, a tiny camera, and an electric cutting edge (resectoscope) is passed through the urethra. In men, the opening of the urethra is at the end of the penis. In women, it is just above the opening of the vagina. Tell a health care provider about: Any allergies you have. All medicines you are taking, including vitamins, herbs, eye drops, creams, and over-the-counter medicines. Any problems you or family members have had with anesthetic medicines. Any bleeding problems you have. Any surgeries you have had. Any medical conditions you have, including recent urinary tract infections. Whether you are pregnant or may be pregnant. What are the risks? Generally, this is a safe procedure. However, problems may occur, including: Infection. Bleeding. Allergic reactions to medicines. Damage to nearby structures or organs. Difficulty urinating from blockage of the urethra or not being able to urinate (urinary retention). Deep vein thrombosis. This is a blood clot that can develop in your leg. Recurring cancer. What happens before the procedure? When to stop eating and drinking Follow instructions from your health care provider about what you may eat and drink before your procedure. These may include: 8 hours before your procedure Stop eating most foods. Do not eat meat, fried foods, or fatty foods. Eat only light foods, such as toast or crackers. All liquids are okay except energy drinks and alcohol. 6 hours before your procedure Stop eating. Drink only clear liquids, such as water, clear fruit juice, black coffee, plain tea, and sports  drinks. Do not drink energy drinks or alcohol. 2 hours before your procedure Stop drinking all liquids. You may be allowed to take medicines with small sips of water. Medicines Ask your health care provider about: Changing or stopping your regular medicines. This is especially important if you are taking diabetes medicines or blood thinners. Taking medicines such as aspirin and ibuprofen. These medicines can thin your blood. Do not take these medicines unless your health care provider tells you to take them. Taking over-the-counter medicines, vitamins, herbs, and supplements. General instructions If you will be going home right after the procedure, plan to have a responsible adult: Take you home from the hospital or clinic. You will not be allowed to drive. Care for you for the time you are told. Ask your health care provider what steps will be taken to help prevent infection. These steps may include: Washing skin with a germ-killing soap. Taking antibiotic medicine. Do not use any products that contain nicotine or tobacco for at least 4 weeks before the procedure. These products include cigarettes, chewing tobacco, and vaping devices, such as e-cigarettes. If you need help quitting, ask your health care provider. What happens during the procedure? An IV will be inserted into one of your veins. You will be given one or more of the following: A medicine to help you relax (sedative). A medicine that is injected into your spine to numb the area below and slightly above the injection site (spinal anesthetic). A medicine that is injected into an area of your body to numb everything below the injection site (regional anesthetic). A medicine to make you fall asleep (general anesthetic). Your legs will be placed in foot rests (  stirrups) to open your legs and bend your knees. The resectoscope will be passed through your urethra and into your bladder. The part of your bladder with the tumor will be  resected by the cutting edge of the resectoscope. Fluid will be passed to rinse out the cut tissues (irrigation). The resectoscope will then be taken out. A small, thin tube (catheter) will be passed through your urethra and into your bladder. The catheter will drain urine into a bag outside of your body. The procedure may vary among health care providers and hospitals. What happens after the procedure? Your blood pressure, heart rate, breathing rate, and blood oxygen level will be monitored until you leave the hospital or clinic. You may continue to receive fluids and medicines through an IV. You will be given pain medicine to relieve pain. You will have a catheter to drain your urine. The amount of urine will be measured. If you have blood in your urine, your bladder may be rinsed out by passing fluid through your catheter. You will be encouraged to walk as soon as you can. You may have to wear compression stockings. These stockings help to prevent blood clots and reduce swelling in your legs. If you were given a sedative during the procedure, it can affect you for several hours. Do not drive or operate machinery until your health care provider says that it is safe. Summary Transurethral resection of a bladder tumor is the removal (resection) of a cancerous growth (tumor) on the inside wall of the bladder. To do this procedure, your health care provider uses a thin telescope with a light, a tiny camera, and an electric cutting edge (resectoscope) that is guided to your bladder through your urethra. The part of your bladder that is affected by the tumor will be resected by the cutting edge of the resectoscope. A catheter will be passed through your urethra and into your bladder. The catheter will drain urine into a bag outside of your body. If you will be going home right after the procedure, plan to have a responsible adult take you home from the hospital or clinic. You will not be allowed to  drive. This information is not intended to replace advice given to you by your health care provider. Make sure you discuss any questions you have with your health care provider. Document Revised: 09/03/2021 Document Reviewed: 09/03/2021 Elsevier Patient Education  2023 [employer] Inc.  

## 2022-02-18 NOTE — Progress Notes (Addendum)
   02/18/22  CC:  Chief Complaint  Patient presents with   bladder mass    HPI: Michelle Moore is a 66 y.o.female who presents today for a cystoscopy.   Additional note for details.   Vitals:   02/18/22 0927  BP: 129/82  Pulse: 85   NED. A&Ox3.   No respiratory distress   Abd soft, NT, ND Normal external genitalia with patent urethral meatus  Cystoscopy Procedure Note  Patient identification was confirmed, informed consent was obtained, and patient was prepped using Betadine solution.  Lidocaine jelly was administered per urethral meatus.    Procedure: - Flexible cystoscope introduced, without any difficulty.   - Thorough search of the bladder revealed:    normal urethral meatus    normal urothelium    no stones    no ulcers     Small approximately 1 cm tumor at right bladder neck papillary     no urethral polyps    no trabeculation - Ureteral orifices were normal in position and appearance.  Post-Procedure: - Patient tolerated the procedure well  Assessment/ Plan: Bladder tumor - Cystoscopy revealed small approx. 1 cm tumor.  - Will have her proceed to the operating room for TURBT with bilateral retrogrades and intravesical gemcitabine. -Risk and benefits including risk of bleeding, infection, damage surrounding structures amongst others were all discussed.  All questions were answered. - Urine sent for pre=op culture   Schedule surgery  Conley Rolls as a scribe for Michelle Espy, MD.,have documented all relevant documentation on the behalf of Michelle Espy, MD,as directed by  Michelle Espy, MD while in the presence of Michelle Espy, MD.  I have reviewed the above documentation for accuracy and completeness, and I agree with the above.   Michelle Espy, MD

## 2022-02-18 NOTE — Progress Notes (Signed)
02/18/22 6:02 AM   Michelle Moore 1955/11/04 [id number]  Referring provider:  Lavera Guise, MD [address] [place],  [place] [address] No chief complaint on file.     HPI: Michelle Moore is a 66 y.o.female who presents today for further evaluation of urethral mass.   She was seen by pulmonology on 11/16/2021. She was noted to have generalized abdominal pain she has a personal history of abnormal adenoma. She underwent a CT abdomen and pelvis to further evaluate and was visualized to have a well circumscribed right adrenal adenoma measuring 2.3 x 1.4 cm unchanged. Moderate left upper pole scarring and Nodular enhancing focus at the base of the urinary bladder 9 x 8 mm with no perivesical stranding.  She is also now under the care of of medical oncology, Dr. Grayland Ormond for further evaluation/treatment of her lung nodule.  This was presented at tumor board yesterday.  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  She reports occasion pain in her left upper abdomen underneath her rib cage.  She wonders if this is related to her bladder.  She was a smoker for 40 years x1 pack daily.  She quit smoking 6 years ago.  PMH: Past Medical History:  Diagnosis Date   Anxiety    Arthritis    Breast cancer ([place])    Cancer ([place])    breast-right    COPD (chronic obstructive pulmonary disease) ([place])    Coronary artery disease    Dyspnea    ONLY WITH EXERTION   GERD (gastroesophageal reflux disease)    "sometimes"   Headache(784.0)    sinus headaches   Myocardial infarction ([place])    in 2011  no damage-1 stent   Personal history of radiation therapy    Seizures ([place]) 2014   X1-no idea what caused her seizure   Sleep apnea    USES CPAP    Surgical History: Past Surgical History:  Procedure Laterality Date   ABDOMINAL HYSTERECTOMY     ANTERIOR CERVICAL DECOMP/DISCECTOMY FUSION N/A 01/20/2014   Procedure: ANTERIOR CERVICAL  DECOMPRESSION/DISCECTOMY FUSION 2 LEVELS cervical five/six six [place];  Surgeon: Ophelia Charter, MD;  Location: [facility];  Service: Neurosurgery;  Laterality: N/A;   BACK SURGERY     lumbar   BREAST BIOPSY Right 02/01/2018   x shape, DCIS    BREAST BIOPSY  01/16/2019   coil, BENIGN MAMMARY TISSUE WITH MODERATE STROMAL FIBROSIS AND COARSE DYSTROPHIC CALCIFICATIONS of the RIGHT breast   BREAST LUMPECTOMY Right 03/07/2018   Procedure: BREAST LUMPECTOMY/ RE EXCISION;  Surgeon: Herbert Pun, MD;  Location: [facility];  Service: General;  Laterality: Right;   CARDIAC CATHETERIZATION     2011   COLONOSCOPY WITH PROPOFOL N/A 02/11/2021   Procedure: COLONOSCOPY WITH PROPOFOL;  Surgeon: Lucilla Lame, MD;  Location: [facility];  Service: Endoscopy;  Laterality: N/A;  Requests Early   CORONARY ANGIOPLASTY WITH STENT PLACEMENT     PLACED IN [facility] 2011   EYE SURGERY     lasik   PARTIAL MASTECTOMY WITH NEEDLE LOCALIZATION Right 02/21/2018   Procedure: PARTIAL MASTECTOMY WITH NEEDLE LOCALIZATION;  Surgeon: Herbert Pun, MD;  Location: [facility];  Service: General;  Laterality: Right;   POLYPECTOMY N/A 02/11/2021   Procedure: POLYPECTOMY;  Surgeon: Lucilla Lame, MD;  Location: [facility];  Service: Endoscopy;  Laterality: N/A;   ROTATOR CUFF REPAIR     left shoulder   SHOULDER ARTHROSCOPY WITH  ROTATOR CUFF REPAIR AND OPEN BICEPS TENODESIS Right 08/07/2019   Procedure: SHOULDER ARTHROSCOPY WITH DEBRIDEMENT, DECOMPRESSION, ROTATOR CUFF REPAIR AND BICEPS TENODESIS. - RNFA;  Surgeon: Corky Mull, MD;  Location: [facility];  Service: Orthopedics;  Laterality: Right;   TOE SURGERY Right    has pin and plate in it    Home Medications:  Allergies as of 02/18/2022       Reactions   Other    BANDAIDS-OF LEFT ON FOR AN EXTENDED PERIOD OF TIME        Medication List        Accurate as of February 18, 2022  6:02 AM. If you have any questions, ask your nurse or doctor.           albuterol 108 (90 Base) MCG/ACT inhaler Commonly known as: VENTOLIN HFA Inhale 1-2 puffs into the lungs every 6 (six) hours as needed for wheezing or shortness of breath.   alendronate 70 MG tablet Commonly known as: Fosamax Take 1 tablet (70 mg total) by mouth once a week. Take with a full glass of water on an empty stomach. Stand or sit upright for 1 hour after taking   ALPRAZolam 0.25 MG tablet Commonly known as: XANAX Take 1 tablet (0.25 mg total) by mouth 2 (two) times daily as needed for anxiety.   anastrozole 1 MG tablet Commonly known as: ARIMIDEX TAKE (1) TABLET BY MOUTH EVERY DAY   aspirin-acetaminophen-caffeine 371-062-69 MG tablet Commonly known as: EXCEDRIN MIGRAINE Take by mouth every 6 (six) hours as needed for headache.   buPROPion 150 MG 24 hr tablet Commonly known as: WELLBUTRIN XL TAKE (1) TABLET BY MOUTH EVERY DAY   cetirizine 10 MG tablet Commonly known as: ZYRTEC Take 10 mg by mouth daily as needed for allergies.   fluticasone 50 MCG/ACT nasal spray Commonly known as: FLONASE Place 2 sprays into both nostrils daily as needed for allergies.   fluticasone-salmeterol 250-50 MCG/ACT Aepb Commonly known as: ADVAIR Inhale 1 puff into the lungs in the morning and at bedtime.   folic acid 485 MCG tablet Commonly known as: FOLVITE Take 1,600 mcg by mouth daily.   methotrexate 2.5 MG tablet Commonly known as: RHEUMATREX Take 20 mg by mouth every Wednesday.   montelukast 10 MG tablet Commonly known as: SINGULAIR Take 1 tablet (10 mg total) by mouth at bedtime.   omeprazole 40 MG capsule Commonly known as: PRILOSEC Take 1 capsule (40 mg total) by mouth daily.   potassium chloride 10 MEQ tablet Commonly known as: KLOR-CON M Take 1 tablet (10 mEq total) by mouth daily.   sertraline 100 MG tablet Commonly known as: ZOLOFT Take 1 tablet (100 mg total) by mouth daily.   simvastatin 40 MG tablet Commonly known as: ZOCOR Take 1 tablet (40 mg total)  by mouth at bedtime.   traMADol 50 MG tablet Commonly known as: ULTRAM   triamcinolone cream 0.1 % Commonly known as: KENALOG Apply 1 application. topically 2 (two) times daily. To affected area.        Allergies:  Allergies  Allergen Reactions   Other     BANDAIDS-OF LEFT ON FOR AN EXTENDED PERIOD OF TIME    Family History: Family History  Problem Relation Age of Onset   Breast cancer Cousin 58       bilateral at 84 and 70; daughter of maternal aunt who was unaffected   Lung cancer Maternal Aunt        dx 36s; deceased 45s; smoker  Heart attack Father        deceased 70   Stomach cancer Maternal Grandmother     Social History:  reports that she quit smoking about 6 years ago. Her smoking use included cigarettes. She has a 30.00 pack-year smoking history. She has never used smokeless tobacco. She reports current alcohol use. She reports that she does not use drugs.   Physical Exam: There were no vitals taken for this visit.  Constitutional:  Alert and oriented, No acute distress. HEENT: [place] AT, moist mucus membranes.  Trachea midline, no masses. Cardiovascular: No clubbing, cyanosis, or edema. Respiratory: Normal respiratory effort, no increased work of breathing. Skin: No rashes, bruises or suspicious lesions. Neurologic: Grossly intact, no focal deficits, moving all 4 extremities. Psychiatric: Normal mood and affect.  Laboratory Data: Lab Results  Component Value Date   CREATININE 0.77 06/09/2021     Pertinent Imaging: CLINICAL DATA:  Abdominal pain, follow-up for adrenal adenoma.   EXAM: CT ABDOMEN AND PELVIS WITH CONTRAST   TECHNIQUE: Multidetector CT imaging of the abdomen and pelvis was performed using the standard protocol following bolus administration of intravenous contrast.   RADIATION DOSE REDUCTION: This exam was performed according to the departmental dose-optimization program which includes automated exposure control, adjustment of the  mA and/or kV according to patient size and/or use of iterative reconstruction technique.   CONTRAST:  164mL OMNIPAQUE IOHEXOL 300 MG/ML  SOLN   COMPARISON:  Comparison made with July 17, 2013.   FINDINGS: Lower chest: No effusion.  No consolidation.   Hepatobiliary: Liver with smooth contours. Background hepatic steatosis. No pericholecystic stranding. Portal vein is patent. No biliary duct distension.   Pancreas: Normal, without mass, inflammation or ductal dilatation.   Spleen: Normal.   Adrenals/Urinary Tract: Well-circumscribed RIGHT adrenal adenoma measuring 2.3 x 1.4 cm unchanged. No follow-up recommended for this finding.   LEFT adrenal gland is normal.   Moderate LEFT upper pole scarring is new. Enhancement of the kidneys is otherwise symmetric without signs of hydronephrosis, perinephric stranding or suspicious renal lesion. Nodular enhancing focus at the base of the urinary bladder 9 x 8 mm (image 71/2) no perivesical stranding.   Stomach/Bowel: No acute gastrointestinal process. Appendix not visualized, no secondary signs to suggest acute appendicitis. Ingested contrast media passes through the colon.   Vascular/Lymphatic:   Aortic atherosclerosis. No sign of aneurysm. Smooth contour of the IVC. There is no gastrohepatic or hepatoduodenal ligament lymphadenopathy. No retroperitoneal or mesenteric lymphadenopathy.   No pelvic sidewall lymphadenopathy.   Atherosclerotic changes are moderate both calcified and noncalcified plaque is noted in the abdominal aorta and in the iliac vessels. No signs   Reproductive: Post hysterectomy.   Other: No ascites.   Musculoskeletal: Signs of lumbar spinal fusion extending from L3 through L5. No acute bone finding. No destructive bone process. Spinal degenerative changes.   IMPRESSION: 1. Nodular enhancing focus at the base of the urinary bladder, measuring 9 x 8 mm. This is suspicious for urothelial neoplasm  and further evaluation with cystoscopy is suggested. 2. Signs of renal cortical scarring on the LEFT new from previous imaging. 3. Background hepatic steatosis. 4. Aortic atherosclerosis.   Aortic Atherosclerosis (ICD10-I70.0).   These results will be called to the ordering clinician or representative by the Radiologist Assistant, and communication documented in the PACS or [employer].     Electronically Signed   By: Zetta Bills M.D.   On: 12/08/2021 09:41  I have personally reviewed the images and agree with radiologist  interpretation.    Assessment & Plan:    Bladder mass - She is in the high risk for bladder cancer in light of her extensive smoking history  - She was offered an in office cystoscopy today; she is agreeable with this plan. See cystoscopy note for findings. 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  Risk and benefits were reviewed.   I,Kailey Littlejohn,acting as a Education administrator for Hollice Espy, MD.,have documented all relevant documentation on the behalf of Hollice Espy, MD,as directed by  Hollice Espy, MD while in the presence of Hollice Espy, MD.  I have reviewed the above documentation for accuracy and completeness, and I agree with the above.   Hollice Espy, MD    [facility] [address] [place], [place] [address] [phone]

## 2022-02-18 NOTE — Progress Notes (Signed)
Surgical Physician Order Form [facility]  * Scheduling expectation : Next Available  *Length of Case:   *Clearance needed: no  *Anticoagulation Instructions: Hold all anticoagulants  *Aspirin Instructions: Ok to continue Aspirin  *Post-op visit Date/Instructions:  3 month follow up  *Diagnosis: Bladder Tumor  *Procedure: bilateral RTG, TURBT <2cm ([id number]), instillation of intravesical chemotherapy   Additional orders: N/A  -Admit type: OUTpatient  -Anesthesia: General  -VTE Prophylaxis Standing Order SCD's       Other:   -Standing Lab Orders Per Anesthesia    Lab other: None  -Standing Test orders EKG/Chest x-ray per Anesthesia       Test other: Gemcitabine 2000 mg  - Medications:  Ancef 2gm IV  -Other orders:  N/A

## 2022-02-19 LAB — URINE CULTURE: Culture: <10000 — AB

## 2022-02-21 ENCOUNTER — Telehealth: Payer: Self-pay

## 2022-02-21 ENCOUNTER — Ambulatory Visit: Payer: Medicare HMO | Admitting: Pulmonary Disease

## 2022-02-21 ENCOUNTER — Encounter: Payer: Self-pay | Admitting: Pulmonary Disease

## 2022-02-21 ENCOUNTER — Encounter: Payer: Self-pay | Admitting: *Deleted

## 2022-02-21 VITALS — BP 138/84 | HR 83 | Temp 98.7°F | Ht 60.0 in | Wt 141.2 lb

## 2022-02-21 DIAGNOSIS — J449 Chronic obstructive pulmonary disease, unspecified: Secondary | ICD-10-CM | POA: Diagnosis not present

## 2022-02-21 DIAGNOSIS — R590 Localized enlarged lymph nodes: Secondary | ICD-10-CM | POA: Diagnosis not present

## 2022-02-21 DIAGNOSIS — G4733 Obstructive sleep apnea (adult) (pediatric): Secondary | ICD-10-CM | POA: Diagnosis not present

## 2022-02-21 DIAGNOSIS — C3491 Malignant neoplasm of unspecified part of right bronchus or lung: Secondary | ICD-10-CM | POA: Diagnosis not present

## 2022-02-21 DIAGNOSIS — Z9989 Dependence on other enabling machines and devices: Secondary | ICD-10-CM

## 2022-02-21 MED ORDER — BREZTRI AEROSPHERE 160-9-4.8 MCG/ACT IN AERO: 2.0000 | INHALATION_SPRAY | Freq: Two times a day (BID) | RESPIRATORY_TRACT | 0 refills | Status: DC

## 2022-02-21 NOTE — Patient Instructions (Addendum)
We have scheduled you for your procedure on 21 June that is next Wednesday.  Seizure you are having is called bronchoscopy with endobronchial ultrasound meaning that we will look at your bronchial tubes and then use an ultrasound to sample the lymph nodes to check for involvement.  We are giving you a sample of an inhaler called Judithann Sauger this is 2 puffs twice a day to see if this helps with your breathing.  Make sure you rinse your mouth well after you use it.  Let us know how you do with this inhaler.  DO NOT USE ADVAIR WHILE ON THE BREZTRI.  We will see you in follow-up in 4 to 6 weeks time, this is a routine appointment that we schedule after procedures.  However, we will stay in contact with you after the procedure and let you know of the results of the biopsy.

## 2022-02-21 NOTE — Telephone Encounter (Signed)
EBUS scheduled 03/02/2022 at 12:30. [id number] adenopathy  [id number], [id number]  [facility] needed.

## 2022-02-21 NOTE — H&P (View-Only) (Signed)
Subjective:    Patient ID: Michelle Moore, female    DOB: 03/09/1956, 66 y.o.   MRN: [id number] Patient Care Team: Carolynne Edouard as PCP - General (Physician Assistant) Telford Nab, RN as Oncology Nurse Navigator  Chief Complaint  Patient presents with   pulmonary consult    C/o SOB with exertion.    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  She is kindly referred by Dr. Delight Hoh.  Her primary care physician is Dr. Clayborn Bigness.  Patient was presented at [facility] given the indeterminate nature of her adenopathy on PET/CT.  This would impact management of her cancer.  Options discussed were mediastinoscopy versus EBUS.  EBUS would be the least invasive procedure for the patient.  Patient presents today for discussion of this procedure.  We discussed the potential benefits, limitations and complications of the procedure and she is in agreement to proceed.  With regards to her adenopathy she has not been symptomatic.  She has had no cough, sputum production, no hemoptysis.  She does have issues with dyspnea and dyspnea on exertion this has been a chronic issue.  Somewhat relieved with Advair but not maximally.  Tried Trelegy in the past but did not tolerate well.  She does not endorse any other symptomatology today she is, understandably, very anxious and upset.  DATA 12/07/2021 CT chest: 1.9 x 1.4 x 2.5 cm spiculated lesion right upper lobe, 9 mm paratracheal lymph node, on the right breast. 01/06/2022 PET/CT: 2.2 x 1.9 spiculated nodule anterior right upper lobe SUV 16.4 consistent with malignancy, low right paratracheal node 12 mm short axis SUV 4.5 (indeterminate).  Destructive changes involving right lateral seventh rib worrisome for metastatic disease. 01/12/2022  PFTs ([facility]): FEV1 0.9 L or 44%, FVC 1.23 L or 47%, FEV1/FVC 73%.  Minimal bronchodilator response.  Lung volumes mildly reduced likely due to air trapping.  Diffusion capacity moderately reduced at 57%.  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 02/08/2022 right upper lobe biopsy: Percutaneous needle biopsy consistent with non-small cell carcinoma favor squamous cell carcinoma.  Review of Systems A 10 point review of systems was performed and it is as noted above otherwise negative.   Past Medical History:  Diagnosis Date   Anxiety    Arthritis    Breast cancer ([place])    Cancer ([place])    breast-right    COPD (chronic obstructive pulmonary disease) ([place])    Coronary artery disease    Dyspnea    ONLY WITH EXERTION   GERD (gastroesophageal reflux disease)    "sometimes"   Headache(784.0)    sinus headaches   Myocardial infarction ([place])    in 2011  no damage-1 stent   Personal history of radiation therapy    Seizures ([place]) 2014   X1-no idea what caused her seizure   Sleep apnea    USES CPAP   Past Surgical History:  Procedure Laterality Date   ABDOMINAL HYSTERECTOMY     ANTERIOR CERVICAL DECOMP/DISCECTOMY FUSION N/A 01/20/2014   Procedure: ANTERIOR CERVICAL DECOMPRESSION/DISCECTOMY FUSION 2 LEVELS cervical five/six six [place];  Surgeon: Ophelia Charter, MD;  Location: [facility];  Service: Neurosurgery;  Laterality: N/A;   BACK SURGERY     lumbar   BREAST BIOPSY Right 02/01/2018  x shape, DCIS    BREAST BIOPSY  01/16/2019   coil, BENIGN MAMMARY TISSUE WITH MODERATE STROMAL FIBROSIS AND COARSE DYSTROPHIC CALCIFICATIONS of the RIGHT breast   BREAST LUMPECTOMY Right 03/07/2018   Procedure: BREAST LUMPECTOMY/ RE EXCISION;  Surgeon: Herbert Pun, MD;  Location: [facility];  Service: General;  Laterality: Right;   CARDIAC CATHETERIZATION     2011   COLONOSCOPY WITH PROPOFOL N/A 02/11/2021   Procedure: COLONOSCOPY WITH PROPOFOL;  Surgeon: Lucilla Lame,  MD;  Location: [facility];  Service: Endoscopy;  Laterality: N/A;  Requests Early   CORONARY ANGIOPLASTY WITH STENT PLACEMENT     PLACED IN [facility] 2011   EYE SURGERY     lasik   PARTIAL MASTECTOMY WITH NEEDLE LOCALIZATION Right 02/21/2018   Procedure: PARTIAL MASTECTOMY WITH NEEDLE LOCALIZATION;  Surgeon: Herbert Pun, MD;  Location: [facility];  Service: General;  Laterality: Right;   POLYPECTOMY N/A 02/11/2021   Procedure: POLYPECTOMY;  Surgeon: Lucilla Lame, MD;  Location: [facility];  Service: Endoscopy;  Laterality: N/A;   ROTATOR CUFF REPAIR     left shoulder   SHOULDER ARTHROSCOPY WITH ROTATOR CUFF REPAIR AND OPEN BICEPS TENODESIS Right 08/07/2019   Procedure: SHOULDER ARTHROSCOPY WITH DEBRIDEMENT, DECOMPRESSION, ROTATOR CUFF REPAIR AND BICEPS TENODESIS. - RNFA;  Surgeon: Corky Mull, MD;  Location: [facility];  Service: Orthopedics;  Laterality: Right;   TOE SURGERY Right    has pin and plate in it   Patient Active Problem List   Diagnosis Date Noted   Squamous cell carcinoma of right lung ([place]) 02/15/2022   S/P cardiac catheterization 04/06/2021   Osteoporosis without current pathological fracture 04/06/2021   Aromatase inhibitor use 04/06/2021   Special screening for malignant neoplasms, colon    Family history of colon cancer    Polyp of descending colon    Cough 08/28/2020   Hypokalemia 07/25/2020   Urinary tract infection without hematuria 07/25/2020   Recurrent displacement of lumbar disc 04/08/2020   Essential hypertension 01/22/2020   Degenerative tear of glenoid labrum of right shoulder 07/19/2019   Nontraumatic complete tear of right rotator cuff 07/19/2019   Rotator cuff tendinitis, right 07/19/2019   Tendinitis of upper biceps tendon of right shoulder 07/19/2019   Encounter for general adult medical examination with abnormal findings 05/29/2019   Dysuria 05/29/2019   Acute non-recurrent pansinusitis 06/01/2018   Perennial allergic rhinitis  06/01/2018   Generalized anxiety disorder 06/01/2018   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   Coronary artery disease 11/09/2017   Mixed hyperlipidemia 11/09/2017   Bilateral carotid artery stenosis 08/14/2014   Gastroesophageal reflux disease with esophagitis 08/14/2014   OSA on CPAP 07/14/2014   Weight loss of more than 10% body weight 07/14/2014   Cervical spondylosis with radiculopathy 01/20/2014   Cervical spondylosis without myelopathy 01/20/2014   Family History  Problem Relation Age of Onset   Breast cancer Cousin 31       bilateral at 76 and 11; daughter of maternal aunt who was unaffected   Lung cancer Maternal Aunt        dx 74s; deceased 18s; smoker   Heart attack Father        deceased 74   Stomach cancer Maternal Grandmother    Social History   Tobacco Use   Smoking status: Former    Packs/day: 1.00    Years: 30.00    Total pack years: 30.00    Types: Cigarettes    Quit date: 09/13/2015  Years since quitting: 6.4   Smokeless tobacco: Never  Substance Use Topics   Alcohol use: Yes    Comment: occasional   Allergies  Allergen Reactions   Other     BANDAIDS-OF LEFT ON FOR AN EXTENDED PERIOD OF TIME   Current Meds  Medication Sig   albuterol (VENTOLIN HFA) 108 (90 Base) MCG/ACT inhaler Inhale 1-2 puffs into the lungs every 6 (six) hours as needed for wheezing or shortness of breath.   alendronate (FOSAMAX) 70 MG tablet Take 1 tablet (70 mg total) by mouth once a week. Take with a full glass of water on an empty stomach. Stand or sit upright for 1 hour after taking   ALPRAZolam (XANAX) 0.25 MG tablet Take 1 tablet (0.25 mg total) by mouth 2 (two) times daily as needed for anxiety.   anastrozole (ARIMIDEX) 1 MG tablet TAKE (1) TABLET BY MOUTH EVERY DAY   aspirin-acetaminophen-caffeine (EXCEDRIN MIGRAINE) 353-614-43 MG tablet Take by mouth every 6 (six) hours as needed for headache.   buPROPion (WELLBUTRIN XL) 150 MG 24 hr tablet TAKE (1)  TABLET BY MOUTH EVERY DAY   cetirizine (ZYRTEC) 10 MG tablet Take 10 mg by mouth daily as needed for allergies.    fluticasone (FLONASE) 50 MCG/ACT nasal spray Place 2 sprays into both nostrils daily as needed for allergies.   folic acid (FOLVITE) 1 MG tablet Take 1 mg by mouth 2 (two) times daily.   methotrexate (RHEUMATREX) 2.5 MG tablet Take 20 mg by mouth every Wednesday.    montelukast (SINGULAIR) 10 MG tablet Take 1 tablet (10 mg total) by mouth at bedtime.   omeprazole (PRILOSEC) 40 MG capsule Take 1 capsule (40 mg total) by mouth daily.   potassium chloride (KLOR-CON) 10 MEQ tablet Take 1 tablet (10 mEq total) by mouth daily.   predniSONE (DELTASONE) 5 MG tablet Take by mouth.   sertraline (ZOLOFT) 100 MG tablet Take 1 tablet (100 mg total) by mouth daily.   simvastatin (ZOCOR) 40 MG tablet Take 1 tablet (40 mg total) by mouth at bedtime.   traMADol (ULTRAM) 50 MG tablet    triamcinolone cream (KENALOG) 0.1 % Apply 1 application. topically 2 (two) times daily. To affected area.   fluticasone-salmeterol (ADVAIR) 250-50 MCG/ACT AEPB Inhale 1 puff into the lungs in the morning and at bedtime.   [DISCONTINUED] folic acid (FOLVITE) 154 MCG tablet Take 1,600 mcg by mouth daily.   Immunization History  Administered Date(s) Administered   Influenza Inj Mdck Quad Pf 04/24/2020, 06/03/2021   Influenza,inj,Quad PF,6+ Mos 08/02/2018   Influenza,inj,quad, With Preservative 06/17/2019   Influenza-Unspecified 06/17/2019   Moderna SARS-COV2 Booster Vaccination 08/04/2020   Moderna Sars-Covid-2 Vaccination 11/13/2019, 12/11/2019   Pneumococcal Conjugate-13 06/17/2019   Pneumococcal Polysaccharide-23 06/17/2019       Objective:   Physical Exam BP 138/84 (BP Location: Left Arm, Cuff Size: Normal)   Pulse 83   Temp 98.7 F (37.1 C) (Temporal)   Ht 5' (1.524 m)   Wt 141 lb 3.2 oz (64 kg)   SpO2 98%   BMI 27.58 kg/m  GENERAL: Well-developed, well-nourished woman, no acute distress.  She is  anxious.  Fully ambulatory.  No conversational dyspnea. HEAD: Normocephalic, atraumatic.  EYES: Pupils equal, round, reactive to light.  No scleral icterus.  MOUTH: Oral mucosa moist.  No thrush. NECK: Supple. No thyromegaly. Trachea midline. No JVD.  No adenopathy. PULMONARY: Good air entry bilaterally.  No adventitious sounds. CARDIOVASCULAR: S1 and S2. Regular rate and rhythm.  No  rubs, murmurs or gallops heard. ABDOMEN: Benign. MUSCULOSKELETAL: No joint deformity, no clubbing, no edema.  NEUROLOGIC: No overt focal deficit, no gait disturbance, speech is fluent. SKIN: Intact,warm,dry. PSYCH: Anxious, at times tearful, normal behavior.  Representative image from PET/CT obtained 06 January 2022:     Assessment & Plan:     ICD-10-CM   1. Mediastinal adenopathy  R59.0    Patient has FDG avid pretracheal lymph node FDG uptake indeterminate Needs EBUS for sampling and staging    2. Squamous cell carcinoma of right lung (HCC)  C34.91    Recently diagnosed by percutaneous needle biopsy Needs staging of the mediastinum Staging necessary for proper oncologic care plan    3. Stage 3 severe COPD by GOLD classification (HCC)  J44.9    Trial of Breztri 2 puffs twice a day Hold Advair while on Breztri     4. OSA on CPAP  G47.33    Z99.89    States compliant with CPAP     Meds ordered this encounter  Medications   Budeson-Glycopyrrol-Formoterol (BREZTRI AEROSPHERE) 160-9-4.8 MCG/ACT AERO    Sig: Inhale 2 puffs into the lungs in the morning and at bedtime.    Dispense:  5.9 g    Refill:  0    Order Specific Question:   Lot Number?    Answer:   [id number]    Order Specific Question:   Manufacturer?    Answer:   [employer] [[id number]]    Order Specific Question:   Quantity    Answer:   2   Benefits, limitations and potential complications of the procedure were discussed with the patient/family.  Complications from bronchoscopy are rare and most often minor, but if they occur they may  include breathing difficulty, vocal cord spasm, hoarseness, slight fever, vomiting, dizziness, bronchospasm, infection, low blood oxygen, bleeding from biopsy site, or an allergic reaction to medications.  It is uncommon for patients to experience other more serious complications for example: Collapsed lung requiring chest tube placement, respiratory failure, heart attack and/or cardiac arrhythmia.  Patient agrees to proceed.  The patient understands the need for staging the mediastinum prior to recommending further therapies for squamous cell carcinoma of the lung.  We will proceed with endobronchial ultrasound-guided (EBUS) TBNA on 02 March 2022 at 12:30 PM.  We will see the patient in follow-up for postprocedural visit in 4 to 6 weeks.  We will however stay in contact with her with the results of the TBNA as these become available.  She will be released back to the care of Dr. Chancy Milroy once procedures are completed.   Renold Don, MD Advanced Bronchoscopy PCCM [facility]    *This note was dictated using voice recognition software/Dragon.  Despite best efforts to proofread, errors can occur which can change the meaning. Any transcriptional errors that result from this process are unintentional and may not be fully corrected at the time of dictation.

## 2022-02-21 NOTE — Progress Notes (Signed)
Per Dr. Grayland Ormond, okay to proceed with referral to Dr. Patsey Berthold to evaluate right paratracheal node with EBUS as recommended at case conference. Referral has been placed. Pt made aware of recommendations and advised to keep appt with thoracic surgeon on 7/5 as scheduled while awaiting further evaluation of right paratracheal lymph node. All questions answered during call. Nothing further needed at this time.

## 2022-02-21 NOTE — Progress Notes (Addendum)
Subjective:    Patient ID: Michelle Moore, female    DOB: 1956-01-13, 66 y.o.   MRN: [id number] Patient Care Team: Carolynne Edouard as PCP - General (Physician Assistant) Telford Nab, RN as Oncology Nurse Navigator  Chief Complaint  Patient presents with   pulmonary consult    C/o SOB with exertion.    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  She is kindly referred by Dr. Delight Hoh.  Her primary care physician is Dr. Clayborn Bigness.  Patient was presented at [facility] given the indeterminate nature of her adenopathy on PET/CT.  This would impact management of her cancer.  Options discussed were mediastinoscopy versus EBUS.  EBUS would be the least invasive procedure for the patient.  Patient presents today for discussion of this procedure.  We discussed the potential benefits, limitations and complications of the procedure and she is in agreement to proceed.  With regards to her adenopathy she has not been symptomatic.  She has had no cough, sputum production, no hemoptysis.  She does have issues with dyspnea and dyspnea on exertion this has been a chronic issue.  Somewhat relieved with Advair but not maximally.  Tried Trelegy in the past but did not tolerate well.  She does not endorse any other symptomatology today she is, understandably, very anxious and upset.  DATA 12/07/2021 CT chest: 1.9 x 1.4 x 2.5 cm spiculated lesion right upper lobe, 9 mm paratracheal lymph node, on the right breast. 01/06/2022 PET/CT: 2.2 x 1.9 spiculated nodule anterior right upper lobe SUV 16.4 consistent with malignancy, low right paratracheal node 12 mm short axis SUV 4.5 (indeterminate).  Destructive changes involving right lateral seventh rib worrisome for metastatic disease. 01/12/2022  PFTs ([facility]): FEV1 0.9 L or 44%, FVC 1.23 L or 47%, FEV1/FVC 73%.  Minimal bronchodilator response.  Lung volumes mildly reduced likely due to air trapping.  Diffusion capacity moderately reduced at 57%.  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 02/08/2022 right upper lobe biopsy: Percutaneous needle biopsy consistent with non-small cell carcinoma favor squamous cell carcinoma.  Review of Systems A 10 point review of systems was performed and it is as noted above otherwise negative.   Past Medical History:  Diagnosis Date   Anxiety    Arthritis    Breast cancer ([place])    Cancer ([place])    breast-right    COPD (chronic obstructive pulmonary disease) ([place])    Coronary artery disease    Dyspnea    ONLY WITH EXERTION   GERD (gastroesophageal reflux disease)    "sometimes"   Headache(784.0)    sinus headaches   Myocardial infarction ([place])    in 2011  no damage-1 stent   Personal history of radiation therapy    Seizures ([place]) 2014   X1-no idea what caused her seizure   Sleep apnea    USES CPAP   Past Surgical History:  Procedure Laterality Date   ABDOMINAL HYSTERECTOMY     ANTERIOR CERVICAL DECOMP/DISCECTOMY FUSION N/A 01/20/2014   Procedure: ANTERIOR CERVICAL DECOMPRESSION/DISCECTOMY FUSION 2 LEVELS cervical five/six six [place];  Surgeon: Ophelia Charter, MD;  Location: [facility];  Service: Neurosurgery;  Laterality: N/A;   BACK SURGERY     lumbar   BREAST BIOPSY Right 02/01/2018  x shape, DCIS    BREAST BIOPSY  01/16/2019   coil, BENIGN MAMMARY TISSUE WITH MODERATE STROMAL FIBROSIS AND COARSE DYSTROPHIC CALCIFICATIONS of the RIGHT breast   BREAST LUMPECTOMY Right 03/07/2018   Procedure: BREAST LUMPECTOMY/ RE EXCISION;  Surgeon: Herbert Pun, MD;  Location: [facility] ORS;  Service: General;  Laterality: Right;   CARDIAC CATHETERIZATION     2011   COLONOSCOPY WITH PROPOFOL N/A 02/11/2021   Procedure: COLONOSCOPY WITH PROPOFOL;  Surgeon: Lucilla Lame,  MD;  Location: [facility];  Service: Endoscopy;  Laterality: N/A;  Requests Early   CORONARY ANGIOPLASTY WITH STENT PLACEMENT     PLACED IN [facility] 2011   EYE SURGERY     lasik   PARTIAL MASTECTOMY WITH NEEDLE LOCALIZATION Right 02/21/2018   Procedure: PARTIAL MASTECTOMY WITH NEEDLE LOCALIZATION;  Surgeon: Herbert Pun, MD;  Location: [facility];  Service: General;  Laterality: Right;   POLYPECTOMY N/A 02/11/2021   Procedure: POLYPECTOMY;  Surgeon: Lucilla Lame, MD;  Location: [facility];  Service: Endoscopy;  Laterality: N/A;   ROTATOR CUFF REPAIR     left shoulder   SHOULDER ARTHROSCOPY WITH ROTATOR CUFF REPAIR AND OPEN BICEPS TENODESIS Right 08/07/2019   Procedure: SHOULDER ARTHROSCOPY WITH DEBRIDEMENT, DECOMPRESSION, ROTATOR CUFF REPAIR AND BICEPS TENODESIS. - RNFA;  Surgeon: Corky Mull, MD;  Location: [facility] ORS;  Service: Orthopedics;  Laterality: Right;   TOE SURGERY Right    has pin and plate in it   Patient Active Problem List   Diagnosis Date Noted   Squamous cell carcinoma of right lung ([place]) 02/15/2022   S/P cardiac catheterization 04/06/2021   Osteoporosis without current pathological fracture 04/06/2021   Aromatase inhibitor use 04/06/2021   Special screening for malignant neoplasms, colon    Family history of colon cancer    Polyp of descending colon    Cough 08/28/2020   Hypokalemia 07/25/2020   Urinary tract infection without hematuria 07/25/2020   Recurrent displacement of lumbar disc 04/08/2020   Essential hypertension 01/22/2020   Degenerative tear of glenoid labrum of right shoulder 07/19/2019   Nontraumatic complete tear of right rotator cuff 07/19/2019   Rotator cuff tendinitis, right 07/19/2019   Tendinitis of upper biceps tendon of right shoulder 07/19/2019   Encounter for general adult medical examination with abnormal findings 05/29/2019   Dysuria 05/29/2019   Acute non-recurrent pansinusitis 06/01/2018   Perennial allergic rhinitis  06/01/2018   Generalized anxiety disorder 06/01/2018   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   Coronary artery disease 11/09/2017   Mixed hyperlipidemia 11/09/2017   Bilateral carotid artery stenosis 08/14/2014   Gastroesophageal reflux disease with esophagitis 08/14/2014   OSA on CPAP 07/14/2014   Weight loss of more than 10% body weight 07/14/2014   Cervical spondylosis with radiculopathy 01/20/2014   Cervical spondylosis without myelopathy 01/20/2014   Family History  Problem Relation Age of Onset   Breast cancer Cousin 10       bilateral at 54 and 68; daughter of maternal aunt who was unaffected   Lung cancer Maternal Aunt        dx 63s; deceased 11s; smoker   Heart attack Father        deceased 52   Stomach cancer Maternal Grandmother    Social History   Tobacco Use   Smoking status: Former    Packs/day: 1.00    Years: 30.00    Total pack years: 30.00    Types: Cigarettes    Quit date: 09/13/2015  Years since quitting: 6.4   Smokeless tobacco: Never  Substance Use Topics   Alcohol use: Yes    Comment: occasional   Allergies  Allergen Reactions   Other     BANDAIDS-OF LEFT ON FOR AN EXTENDED PERIOD OF TIME   Current Meds  Medication Sig   albuterol (VENTOLIN HFA) 108 (90 Base) MCG/ACT inhaler Inhale 1-2 puffs into the lungs every 6 (six) hours as needed for wheezing or shortness of breath.   alendronate (FOSAMAX) 70 MG tablet Take 1 tablet (70 mg total) by mouth once a week. Take with a full glass of water on an empty stomach. Stand or sit upright for 1 hour after taking   ALPRAZolam (XANAX) 0.25 MG tablet Take 1 tablet (0.25 mg total) by mouth 2 (two) times daily as needed for anxiety.   anastrozole (ARIMIDEX) 1 MG tablet TAKE (1) TABLET BY MOUTH EVERY DAY   aspirin-acetaminophen-caffeine (EXCEDRIN MIGRAINE) 818-563-14 MG tablet Take by mouth every 6 (six) hours as needed for headache.   buPROPion (WELLBUTRIN XL) 150 MG 24 hr tablet TAKE (1)  TABLET BY MOUTH EVERY DAY   cetirizine (ZYRTEC) 10 MG tablet Take 10 mg by mouth daily as needed for allergies.    fluticasone (FLONASE) 50 MCG/ACT nasal spray Place 2 sprays into both nostrils daily as needed for allergies.   folic acid (FOLVITE) 1 MG tablet Take 1 mg by mouth 2 (two) times daily.   methotrexate (RHEUMATREX) 2.5 MG tablet Take 20 mg by mouth every Wednesday.    montelukast (SINGULAIR) 10 MG tablet Take 1 tablet (10 mg total) by mouth at bedtime.   omeprazole (PRILOSEC) 40 MG capsule Take 1 capsule (40 mg total) by mouth daily.   potassium chloride (KLOR-CON) 10 MEQ tablet Take 1 tablet (10 mEq total) by mouth daily.   predniSONE (DELTASONE) 5 MG tablet Take by mouth.   sertraline (ZOLOFT) 100 MG tablet Take 1 tablet (100 mg total) by mouth daily.   simvastatin (ZOCOR) 40 MG tablet Take 1 tablet (40 mg total) by mouth at bedtime.   traMADol (ULTRAM) 50 MG tablet    triamcinolone cream (KENALOG) 0.1 % Apply 1 application. topically 2 (two) times daily. To affected area.   fluticasone-salmeterol (ADVAIR) 250-50 MCG/ACT AEPB Inhale 1 puff into the lungs in the morning and at bedtime.   [DISCONTINUED] folic acid (FOLVITE) 970 MCG tablet Take 1,600 mcg by mouth daily.   Immunization History  Administered Date(s) Administered   Influenza Inj Mdck Quad Pf 04/24/2020, 06/03/2021   Influenza,inj,Quad PF,6+ Mos 08/02/2018   Influenza,inj,quad, With Preservative 06/17/2019   Influenza-Unspecified 06/17/2019   Moderna SARS-COV2 Booster Vaccination 08/04/2020   Moderna Sars-Covid-2 Vaccination 11/13/2019, 12/11/2019   Pneumococcal Conjugate-13 06/17/2019   Pneumococcal Polysaccharide-23 06/17/2019       Objective:   Physical Exam BP 138/84 (BP Location: Left Arm, Cuff Size: Normal)   Pulse 83   Temp 98.7 F (37.1 C) (Temporal)   Ht 5' (1.524 m)   Wt 141 lb 3.2 oz (64 kg)   SpO2 98%   BMI 27.58 kg/m  GENERAL: Well-developed, well-nourished woman, no acute distress.  She is  anxious.  Fully ambulatory.  No conversational dyspnea. HEAD: Normocephalic, atraumatic.  EYES: Pupils equal, round, reactive to light.  No scleral icterus.  MOUTH: Oral mucosa moist.  No thrush. NECK: Supple. No thyromegaly. Trachea midline. No JVD.  No adenopathy. PULMONARY: Good air entry bilaterally.  No adventitious sounds. CARDIOVASCULAR: S1 and S2. Regular rate and rhythm.  No  rubs, murmurs or gallops heard. ABDOMEN: Benign. MUSCULOSKELETAL: No joint deformity, no clubbing, no edema.  NEUROLOGIC: No overt focal deficit, no gait disturbance, speech is fluent. SKIN: Intact,warm,dry. PSYCH: Anxious, at times tearful, normal behavior.  Representative image from PET/CT obtained 06 January 2022:     Assessment & Plan:     ICD-10-CM   1. Mediastinal adenopathy  R59.0    Patient has FDG avid pretracheal lymph node FDG uptake indeterminate Needs EBUS for sampling and staging    2. Squamous cell carcinoma of right lung (HCC)  C34.91    Recently diagnosed by percutaneous needle biopsy Needs staging of the mediastinum Staging necessary for proper oncologic care plan    3. Stage 3 severe COPD by GOLD classification (HCC)  J44.9    Trial of Breztri 2 puffs twice a day Hold Advair while on Breztri     4. OSA on CPAP  G47.33    Z99.89    States compliant with CPAP     Meds ordered this encounter  Medications   Budeson-Glycopyrrol-Formoterol (BREZTRI AEROSPHERE) 160-9-4.8 MCG/ACT AERO    Sig: Inhale 2 puffs into the lungs in the morning and at bedtime.    Dispense:  5.9 g    Refill:  0    Order Specific Question:   Lot Number?    Answer:   [id number]    Order Specific Question:   Manufacturer?    Answer:   [employer] [[id number]]    Order Specific Question:   Quantity    Answer:   2   Benefits, limitations and potential complications of the procedure were discussed with the patient/family.  Complications from bronchoscopy are rare and most often minor, but if they occur they may  include breathing difficulty, vocal cord spasm, hoarseness, slight fever, vomiting, dizziness, bronchospasm, infection, low blood oxygen, bleeding from biopsy site, or an allergic reaction to medications.  It is uncommon for patients to experience other more serious complications for example: Collapsed lung requiring chest tube placement, respiratory failure, heart attack and/or cardiac arrhythmia.  Patient agrees to proceed.  The patient understands the need for staging the mediastinum prior to recommending further therapies for squamous cell carcinoma of the lung.  We will proceed with endobronchial ultrasound-guided (EBUS) TBNA on 02 March 2022 at 12:30 PM.  We will see the patient in follow-up for postprocedural visit in 4 to 6 weeks.  We will however stay in contact with her with the results of the TBNA as these become available.  She will be released back to the care of Dr. Chancy Milroy once procedures are completed.   Renold Don, MD Advanced Bronchoscopy PCCM [facility]    *This note was dictated using voice recognition software/Dragon.  Despite best efforts to proofread, errors can occur which can change the meaning. Any transcriptional errors that result from this process are unintentional and may not be fully corrected at the time of dictation.

## 2022-02-22 ENCOUNTER — Encounter: Payer: Self-pay | Admitting: Pulmonary Disease

## 2022-02-22 NOTE — Telephone Encounter (Signed)
For the codes [phone], [place] Not Required  Refer # [id number]

## 2022-02-22 NOTE — Telephone Encounter (Signed)
Phone pre admit visit 02/25/2022 between 8-1 and covid test 02/28/2022 at 11:15.  Spoke to patient. She requested that I call back later as she is currently driving.

## 2022-02-22 NOTE — Telephone Encounter (Signed)
Patient is aware of below dates/times and voiced her understanding.  Nothing further needed.

## 2022-02-25 ENCOUNTER — Telehealth: Payer: Self-pay

## 2022-02-25 ENCOUNTER — Other Ambulatory Visit: Payer: Self-pay

## 2022-02-25 ENCOUNTER — Encounter
Admission: RE | Admit: 2022-02-25 | Discharge: 2022-02-25 | Disposition: A | Discharge status: Home / Self Care | Payer: Medicare HMO | Source: Ambulatory Visit | Attending: Pulmonary Disease | Admitting: Pulmonary Disease

## 2022-02-25 DIAGNOSIS — Z01812 Encounter for preprocedural laboratory examination: Secondary | ICD-10-CM

## 2022-02-25 DIAGNOSIS — Z01810 Encounter for preprocedural cardiovascular examination: Secondary | ICD-10-CM | POA: Diagnosis not present

## 2022-02-25 DIAGNOSIS — I10 Essential (primary) hypertension: Secondary | ICD-10-CM

## 2022-02-25 HISTORY — DX: Pneumonia, unspecified organism: J18.9

## 2022-02-25 NOTE — Patient Instructions (Addendum)
Your procedure is scheduled on: 03/02/22 - Wednesday Report to the Registration Desk on the 1st floor of the [facility]. To find out your arrival time, please call [phone] between 1PM - 3PM on: 03/01/22 - Tuesday If your arrival time is 6:00 am, do not arrive prior to that time as the [facility] entrance doors do not open until 6:00 am.  REMEMBER: Instructions that are not followed completely may result in serious medical risk, up to and including death; or upon the discretion of your surgeon and anesthesiologist your surgery may need to be rescheduled.  Do not eat food or drink any fluids after midnight the night before surgery.  No gum chewing, lozengers or hard candies.   TAKE THESE MEDICATIONS THE MORNING OF SURGERY WITH A SIP OF WATER:  - buPROPion (WELLBUTRIN XL) - omeprazole (PRILOSEC) 40 MG capsule, (take one the night before and one on the morning of surgery - helps to prevent nausea after surgery.) - potassium chloride (KLOR-CON) 10 MEQ tablet - sertraline (ZOLOFT) 100 MG tablet - fluticasone-salmeterol (ADVAIR) 100-50 MCG/ACT AEPB  Use albuterol (VENTOLIN HFA) 108 (90 Base) MCG/ACT inhaler on the day of surgery and bring to the hospital.  Stop taking starting today , 02/25/22 : Stop Anti-inflammatories (NSAIDS) such as Advil, Aleve, Ibuprofen, Motrin, Naproxen, Naprosyn and Aspirin based products such as Excedrin, Goodys Powder, BC Powder.  Stop ANY OVER THE COUNTER supplements until after surgery.folic acid   You may take Tylenol if needed for pain up until the day of surgery.  No Alcohol for 24 hours before or after surgery.  No Smoking including e-cigarettes for 24 hours prior to surgery.  No chewable tobacco products for at least 6 hours prior to surgery.  No nicotine patches on the day of surgery.  Do not use any "recreational" drugs for at least a week prior to your surgery.  Please be advised that the combination of cocaine and anesthesia may have  negative outcomes, up to and including death. If you test positive for cocaine, your surgery will be cancelled.  On the morning of surgery brush your teeth with toothpaste and water, you may rinse your mouth with mouthwash if you wish. Do not swallow any toothpaste or mouthwash.  Do not wear jewelry, make-up, hairpins, clips or nail polish.  Do not wear lotions, powders, or perfumes.   Do not shave body from the neck down 48 hours prior to surgery just in case you cut yourself which could leave a site for infection.  Also, freshly shaved skin may become irritated if using the CHG soap.  Contact lenses, hearing aids and dentures may not be worn into surgery.  Do not bring valuables to the hospital. [facility] is not responsible for any missing/lost belongings or valuables.   Bring your C-PAP to the hospital with you in case you may have to spend the night.   Notify your doctor if there is any change in your medical condition (cold, fever, infection).  Wear comfortable clothing (specific to your surgery type) to the hospital.  After surgery, you can help prevent lung complications by doing breathing exercises.  Take deep breaths and cough every 1-2 hours. Your doctor may order a device called an Incentive Spirometer to help you take deep breaths. When coughing or sneezing, hold a pillow firmly against your incision with both hands. This is called "splinting." Doing this helps protect your incision. It also decreases belly discomfort.  If you are being admitted to the hospital  overnight, leave your suitcase in the car. After surgery it may be brought to your room.  If you are being discharged the day of surgery, you will not be allowed to drive home. You will need a responsible adult (18 years or older) to drive you home and stay with you that night.   If you are taking public transportation, you will need to have a responsible adult (18 years or older) with you. Please confirm with  your physician that it is acceptable to use public transportation.   Please call the [facility] Dept. at [phone] if you have any questions about these instructions.  Surgery Visitation Policy:  Patients undergoing a surgery or procedure may have two family members or support persons with them as long as the person is not COVID-19 positive or experiencing its symptoms.   Inpatient Visitation:    Visiting hours are 7 a.m. to 8 p.m. Up to four visitors are allowed at one time in a patient room, including children. The visitors may rotate out with other people during the day. One designated support person (adult) may remain overnight.

## 2022-02-25 NOTE — Progress Notes (Addendum)
[facility] Posting Form   Surgery Date/Time: 05/02/2022  Surgeon: Dr. Hollice Espy, MD  Surgery Location: Day Surgery  Inpt ( No  )   Outpt (Yes)   Obs ( No  )   Diagnosis: D49.4 Bladder Tumor  -CPT: 66063, 01601, [phone]  Surgery: Transurethral Resection of Bladder Tumor with instillation of intravesical Gemcitabine and bilateral retrograde pyelograms   Stop Anticoagulations: Yes, may continue ASA  Cardiac/Medical/Pulmonary Clearance needed: no  *Orders entered into EPIC  Date: 02/25/22   *Case booked in EPIC  Date: 02/22/2022  *Notified pt of Surgery: Date: 02/22/2022  PRE-OP UA & CX: No  *Placed into Prior Authorization Work Que Date: 02/25/22   Assistant/laser/rep:No

## 2022-02-25 NOTE — Telephone Encounter (Signed)
I spoke with Michelle Moore. We have discussed possible surgery dates and Monday July 24th, 2023 was agreed upon by all parties. Patient given information about surgery date, what to expect pre-operatively and post operatively.  We discussed that a Pre-Admission Testing office will be calling to set up the pre-op visit that will take place prior to surgery, and that these appointments are typically done over the phone with a Pre-Admissions RN.  Informed patient that our office will communicate any additional care to be provided after surgery. Patients questions or concerns were discussed during our call. Advised to call our office should there be any additional information, questions or concerns that arise. Patient verbalized understanding.

## 2022-02-28 ENCOUNTER — Encounter: Payer: Self-pay | Admitting: Pulmonary Disease

## 2022-02-28 ENCOUNTER — Encounter
Admission: RE | Admit: 2022-02-28 | Discharge: 2022-02-28 | Disposition: A | Discharge status: Home / Self Care | Payer: Medicare HMO | Source: Ambulatory Visit | Attending: Pulmonary Disease | Admitting: Pulmonary Disease

## 2022-02-28 DIAGNOSIS — R0602 Shortness of breath: Secondary | ICD-10-CM | POA: Diagnosis not present

## 2022-02-28 DIAGNOSIS — Z9889 Other specified postprocedural states: Secondary | ICD-10-CM | POA: Diagnosis not present

## 2022-02-28 DIAGNOSIS — Z01818 Encounter for other preprocedural examination: Secondary | ICD-10-CM | POA: Diagnosis not present

## 2022-02-28 DIAGNOSIS — I214 Non-ST elevation (NSTEMI) myocardial infarction: Secondary | ICD-10-CM | POA: Diagnosis not present

## 2022-02-28 DIAGNOSIS — Z20822 Contact with and (suspected) exposure to covid-19: Secondary | ICD-10-CM | POA: Diagnosis not present

## 2022-02-28 DIAGNOSIS — Z1152 Encounter for screening for COVID-19: Secondary | ICD-10-CM

## 2022-02-28 DIAGNOSIS — I2510 Atherosclerotic heart disease of native coronary artery without angina pectoris: Secondary | ICD-10-CM | POA: Diagnosis not present

## 2022-02-28 DIAGNOSIS — Z01812 Encounter for preprocedural laboratory examination: Secondary | ICD-10-CM

## 2022-02-28 DIAGNOSIS — I6523 Occlusion and stenosis of bilateral carotid arteries: Secondary | ICD-10-CM | POA: Diagnosis not present

## 2022-02-28 DIAGNOSIS — Z9989 Dependence on other enabling machines and devices: Secondary | ICD-10-CM | POA: Diagnosis not present

## 2022-02-28 DIAGNOSIS — G4733 Obstructive sleep apnea (adult) (pediatric): Secondary | ICD-10-CM | POA: Diagnosis not present

## 2022-02-28 DIAGNOSIS — E782 Mixed hyperlipidemia: Secondary | ICD-10-CM | POA: Diagnosis not present

## 2022-02-28 DIAGNOSIS — I10 Essential (primary) hypertension: Secondary | ICD-10-CM

## 2022-02-28 LAB — BASIC METABOLIC PANEL
Anion gap: 7 (ref 5–15)
BUN: 11 mg/dL (ref 8–23)
CO2: 31 mmol/L (ref 22–32)
Calcium: 8.7 mg/dL — ABNORMAL LOW (ref 8.9–10.3)
Chloride: 103 mmol/L (ref 98–111)
Creatinine, Ser: 0.56 mg/dL (ref 0.44–1.00)
GFR, Estimated: >60 mL/min (ref >60)
Glucose, Bld: 106 mg/dL — ABNORMAL HIGH (ref 70–99)
Potassium: 3.5 mmol/L (ref 3.5–5.1)
Sodium: 141 mmol/L (ref 135–145)

## 2022-02-28 NOTE — Progress Notes (Signed)
Perioperative Services  Pre-Admission/Anesthesia Testing Clinical Review  Date: 03/01/22  Patient Demographics:  Name: Michelle Moore DOB:   07-12-1956 MRN:   [id number]  Planned Surgical Procedure(s):    Case: [id number] Date/Time: 03/02/22 1230   Procedure: VIDEO BRONCHOSCOPY WITH ENDOBRONCHIAL ULTRASOUND   Anesthesia type: General   Pre-op diagnosis: MEDIASTINAL HISTORY OF LUNG CANCER   Location: [facility]URE RM 02 / [facility] FOR [facility]ESIA [facility]   Surgeons: Tyler Pita, MD   NOTE: Available PAT nursing documentation and vital signs have been reviewed. Clinical nursing staff has updated patient's PMH/PSHx, current medication list, and drug allergies/intolerances to ensure comprehensive history available to assist in medical decision making as it pertains to the aforementioned surgical procedure and anticipated anesthetic course. Extensive review of available clinical information performed. [place] PMH and PSHx updated with any diagnoses/procedures that  may have been inadvertently omitted during her intake with the pre-admission testing department's nursing staff.  Clinical Discussion:  Michelle Moore is a 66 y.o. female who is submitted for pre-surgical anesthesia review and clearance prior to her undergoing the above procedure.Patient is a Former Smoker (30 pack years; quit 09/2015). Pertinent PMH includes: CAD, STEMI, valvular heart disease, aortic atherosclerosis, HTN, HLD, DOE, GERD (on daily PPI), COPD, OSAH (requires nocturnal PAP therapy), RIGHT breast cancer, PRES syndrome, seizure, OA, RA, anxiety (on BZO PRN), depression  Patient is followed by cardiology Nehemiah Massed, MD). She was last seen in the cardiology clinic on 01/21/2021; notes reviewed. At the time of her clinic visit, the patient denied any chest pain, shortness of breath, PND, orthopnea, palpitations, significant peripheral edema, vertiginous symptoms, or presyncope/syncope.  Patient with a PMH significant for  cardiovascular diagnoses.  Patient suffered an inferior wall STEMI on 10/16/2009.  Diagnostic left heart catheterization was performed revealing a normal left ventricular systolic function with an EF of 60%.  There was multivessel CAD; 50% OM1 and 99% distal RCA.  Patient subsequently underwent PCI placing a 3.0 x 18 mm Xience V DES x 1 to the dRCA yielding an excellent angiographic result and TIMI-3 flow.  Diagnostic left heart catheterization was performed on 07/31/2014 revealing a normal left ventricular systolic function with an EF of 65%.  There was multivessel CAD; 40% proximal RCA, 20% proximal LCx, 40% OM1, 30% ostial RCA, 50% proximal RCA, and 20% mid RCA.  There was 10% ISR within the previously placed distal RCA stent.  Further intervention deferred opting for medical management.  Last complete TTE was performed on 10/10/2016 revealing a low normal left ventricular systolic function with mild LVH; LVEF 50%.  There was mild mitral and tricuspid valve regurgitation.  There was no evidence of a significant transvalvular gradient to suggest stenosis.  Stress echocardiogram was performed on 12/23/2019 revealing a normal left ventricular systolic function with an EF of 55%.  There was trivial mitral, tricuspid, and pulmonary valve regurgitation.  There was no evidence of valvular stenosis.  Blood pressure reasonably controlled at 130/80 without the use of pharmacological intervention.  Patient is on a statin for her HLD and further ASCVD prevention.  She is not diabetic.  Patient has an OSAH diagnosis and is compliant with prescribed nocturnal PAP therapy.  Patient remains on aromatase inhibitor (anastrozole) therapy for previously diagnosed breast cancer. Functional capacity, as defined by DASI, is documented as being >/= 4 METS.  No changes were made to her medication regimen.  Patient to follow-up with outpatient cardiology in 9 months or sooner if needed.  Michelle Moore found  to have an 18 mm  nodular density in the RIGHT upper pulmonary lobe on a diagnostic radiograph of the chest performed on 11/08/2021.  Subsequent CT imaging performed on 12/07/2021 revealed a 1.9 x 1.4 x 2.5 spiculated lesion in the anterior aspect of the RIGHT upper lobe.  PET/CT performed on 01/06/2022 revealed hypermetabolic spiculated mass measuring 2.2 x 1.9 cm with a SUV max of 16.4.  Patient was referred to pulmonary medicine for further evaluation and intervention.  Patient seen in consult by Dr. Vernard Gambles, MD.  The decision was made to proceed with a [place] on 03/02/2022 for tissue sampling needed for definitive diagnosis and treatment.  Given patient's past medical history significant for cardiovascular diagnoses, presurgical cardiac clearance.  PAT team.  Per cardiology, "this patient is optimized for surgery and may proceed with the planned procedural course with a LOW risk of significant perioperative cardiovascular complications". In review of her medication reconciliation, the patient is not noted to be taking any type of anticoagulation or antiplatelet therapies that would need to be held during her perioperative course.  Patient denies previous perioperative complications with anesthesia in the past. In review of the available records, it is noted that patient underwent a general anesthetic course at [facility] ([facility]) in 02/2021 without documented complications.      02/21/2022    2:39 PM 02/18/2022    9:27 AM 02/15/2022   10:00 AM  Vitals with BMI  Height 5\' 0"  5\' 0"  5\' 0"   Weight 141 lbs 3 oz 140 lbs 139 lbs 13 oz  BMI 27.58 97.02 63.7  Systolic 858 850 277  Diastolic 84 82 79  Pulse 83 85 78    Providers/Specialists:   NOTE: Primary physician provider listed below. Patient may have been seen by APP or partner within same practice.   PROVIDER ROLE / SPECIALTY LAST Sherrian Divers, MD Pulmonary Medicine (Surgeon) 02/21/2022  Lavera Guise, MD Primary Care Provider 12/14/2021  Serafina Royals, MD Cardiology 01/21/2021  Delight Hoh, MD Medical Oncology 02/15/2022  Devona Konig, MD Pulmonary Medicine 01/14/2022  Ephriam Jenkins, MD Rheumatology 02/17/2022   Allergies:  Other  Current Home Medications:   No current facility-administered medications for this encounter.    albuterol (VENTOLIN HFA) 108 (90 Base) MCG/ACT inhaler   alendronate (FOSAMAX) 70 MG tablet   ALPRAZolam (XANAX) 0.25 MG tablet   anastrozole (ARIMIDEX) 1 MG tablet   aspirin-acetaminophen-caffeine (EXCEDRIN MIGRAINE) 250-250-65 MG tablet   Budeson-Glycopyrrol-Formoterol (BREZTRI AEROSPHERE) 160-9-4.8 MCG/ACT AERO   buPROPion (WELLBUTRIN XL) 150 MG 24 hr tablet   cetirizine (ZYRTEC) 10 MG tablet   fluticasone (FLONASE) 50 MCG/ACT nasal spray   fluticasone-salmeterol (ADVAIR) 100-50 MCG/ACT AEPB   folic acid (FOLVITE) 1 MG tablet   methotrexate (RHEUMATREX) 2.5 MG tablet   montelukast (SINGULAIR) 10 MG tablet   omeprazole (PRILOSEC) 40 MG capsule   potassium chloride (KLOR-CON) 10 MEQ tablet   predniSONE (DELTASONE) 5 MG tablet   sertraline (ZOLOFT) 100 MG tablet   simvastatin (ZOCOR) 40 MG tablet   traMADol (ULTRAM) 50 MG tablet   triamcinolone cream (KENALOG) 0.1 %   History:   Past Medical History:  Diagnosis Date   Adenoma of right adrenal gland 12/07/2021   a.) CT chest 12/07/2021; measured 2.3 cm; not FDG avid on 01/06/2022 PET CT.   Anxiety    a.) uses BZO (alprazolam) PRN   Aortic atherosclerosis (HCC)    Arthritis    Bladder mass  12/07/2021   a.) 9 x 8 mm nodular enhancing focus at the base of the bladder; suspicious for urothelial neoplasm.   COPD (chronic obstructive pulmonary disease) (HCC)    Coronary artery disease 10/16/2009   a.) LHC/PCI 10/16/2009: EF 60%; 50% OM1, 99% dRCA (3.0 x 18 mm Xience V DES). b.) LHC 07/31/2014: EF 65%; 40% pLAD, 20% pLCx, 40% OM1, 30% oRCA, 50% pRCA, 20% mRCA, 10% ISR stent to dRCA;  further intervention deferred opting for med. mgmt.   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 b.) s/p lumpectomy, adjuvant XT; current on extended AI (anastrozole) therapy   GERD (gastroesophageal reflux disease)    HLD (hyperlipidemia)    HTN (hypertension)    Left thyroid nodule 12/07/2021   a.) CT chest; measured 2.6 cm   Long term current use of immunosuppressive drug    a.) MTX for RA.   OSA on CPAP    Osteopenia    Personal history of radiation therapy    Pneumonia    PRES (posterior reversible encephalopathy syndrome) 07/14/2014   Pulmonary nodules 11/08/2021   a.) CSR 11/08/2021: 39mm nodular density RUL. b.) CT chest 12/07/2021: 1.9 x 1.4 x 2.5 spiculated lesion anterior RUL. c.) PET CT 01/06/2022: 2.2 x 1.p spiculated anterior RUL mass (SUV max 16.4)   Rheumatoid arthritis ([place])    a.) on MTX   Seasonal allergies    Seizures ([place]) 2014   a.) x 1 episode; etiology never determined.   Sinus headache    ST elevation myocardial infarction (STEMI) of inferior wall ([place]) 10/16/2009   a.) LHC/PCI 10/16/2009: EF 60%; 50% OM1, 99% dRCA (3.0 x 18 mm Xience V DES).   Tubular adenoma    Valvular heart disease    Past Surgical History:  Procedure Laterality Date   ABDOMINAL HYSTERECTOMY     ANTERIOR CERVICAL DECOMP/DISCECTOMY FUSION N/A 01/20/2014   Procedure: ANTERIOR CERVICAL DECOMPRESSION/DISCECTOMY FUSION 2 LEVELS cervical five/six six [place]ry [place];  Surgeon: Ophelia Charter, MD;  Location: [facility]S;  Service: Neurosurgery;  Laterality: N/A;   BACK SURGERY     lumbar   BREAST BIOPSY Right 02/01/2018   x shape, DCIS    BREAST BIOPSY  01/16/2019   coil, BENIGN MAMMARY TISSUE WITH MODERATE STROMAL FIBROSIS AND COARSE DYSTROPHIC CALCIFICATIONS of the RIGHT breast   BREAST LUMPECTOMY Right 03/07/2018   Procedure: BREAST LUMPECTOMY/ RE EXCISION;  Surgeon: Herbert Pun, MD;   Location: [facility] ORS;  Service: General;  Laterality: Right;   CARDIAC CATHETERIZATION Left 07/31/2014   Procedure: CARDIAC CATHETERIZATION; Location: [facility]; Surgeon: Serafina Royals, MD   COLONOSCOPY WITH PROPOFOL N/A 02/11/2021   Procedure: COLONOSCOPY WITH PROPOFOL;  Surgeon: Lucilla Lame, MD;  Location: [facility];  Service: Endoscopy;  Laterality: N/A;  Requests Early   CORONARY ANGIOPLASTY WITH STENT PLACEMENT Left 10/16/2009   Procedure: CORONARY ANGIOPLASTY WITH STENT PLACEMENT; Location: [facility]; Surgeon: Katrine Coho, MD   LASIK Bilateral    PARTIAL MASTECTOMY WITH NEEDLE LOCALIZATION Right 02/21/2018   Procedure: PARTIAL MASTECTOMY WITH NEEDLE LOCALIZATION;  Surgeon: Herbert Pun, MD;  Location: [facility] ORS;  Service: General;  Laterality: Right;   POLYPECTOMY N/A 02/11/2021   Procedure: POLYPECTOMY;  Surgeon: Lucilla Lame, MD;  Location: [facility];  Service: Endoscopy;  Laterality: N/A;   ROTATOR CUFF REPAIR Left    SHOULDER ARTHROSCOPY WITH ROTATOR CUFF REPAIR AND OPEN BICEPS TENODESIS Right  08/07/2019   Procedure: SHOULDER ARTHROSCOPY WITH DEBRIDEMENT, DECOMPRESSION, ROTATOR CUFF REPAIR AND BICEPS TENODESIS. - RNFA;  Surgeon: Corky Mull, MD;  Location: [facility] ORS;  Service: Orthopedics;  Laterality: Right;   TOE SURGERY Right    has pin and plate in it   Family History  Problem Relation Age of Onset   Breast cancer Cousin 59       bilateral at 52 and 70; daughter of maternal aunt who was unaffected   Lung cancer Maternal Aunt        dx 39s; deceased 87s; smoker   Heart attack Father        deceased 38   Stomach cancer Maternal Grandmother    Social History   Tobacco Use   Smoking status: Former    Packs/day: 1.00    Years: 30.00    Total pack years: 30.00    Types: Cigarettes    Quit date: 09/13/2015    Years since quitting: 6.4   Smokeless tobacco: Never  Vaping Use   Vaping Use: Never used  Substance Use Topics   Alcohol use: Yes     Comment: occasional   Drug use: No    Pertinent Clinical Results:  LABS: Labs reviewed: Acceptable for surgery.  Component Date Value Ref Range Status   Sodium 02/28/2022 141  135 - 145 mmol/L Final   Potassium 02/28/2022 3.5  3.5 - 5.1 mmol/L Final   Chloride 02/28/2022 103  98 - 111 mmol/L Final   CO2 02/28/2022 31  22 - 32 mmol/L Final   Glucose, Bld 02/28/2022 106 (H)  70 - 99 mg/dL Final   Glucose reference range applies only to samples taken after fasting for at least 8 hours.   BUN 02/28/2022 11  8 - 23 mg/dL Final   Creatinine, Ser 02/28/2022 0.56  0.44 - 1.00 mg/dL Final   Calcium 02/28/2022 8.7 (L)  8.9 - 10.3 mg/dL Final   GFR, Estimated 02/28/2022 >60  >60 mL/min Final   Comment: (NOTE) Calculated using the CKD-EPI Creatinine Equation (2021)    Anion gap 02/28/2022 7  5 - 15 Final   Performed at [facility], [address] [place], [place] [address]   Lab Results  Component Value Date   WBC 6.8 02/08/2022   HGB 13.6 02/08/2022   HCT 44.0 02/08/2022   MCV 95.2 02/08/2022   PLT 241 02/08/2022    ECG: Date: 02/28/2022 Time ECG obtained: 1100 AM Rate: 67 bpm Rhythm:  Normal sinus rhythm with sinus arrhythmia Axis (leads I and aVF): Normal Intervals: PR 112 ms. QRS 74 ms. QTc 433 ms. ST segment and T wave changes: No evidence of acute ST segment elevation or depression Comparison: Similar to previous tracing obtained on 08/01/2019   IMAGING / PROCEDURES: DG CHEST PORT 1 VIEW performed on 02/08/2022 There is a 2.3 cm faint nodular density in the RIGHT upper lobe.  Possibly neoplastic process seen in the PET/CT. No pleural effusion or pneumothorax  NM PET IMAGE INITIAL (PI) SKULL BASE TO THIGH performed on 01/06/2022 2.2 cm spiculated nodule in the anterior right upper lobe, favoring primary bronchogenic neoplasm over metastasis. Dominant 12 mm short axis low right paratracheal node, indeterminate. Attention on follow-up is suggested. Destructive  changes involving the right lateral 7th rib, worrisome for pathologic fracture. Bandlike opacity in the anterior left upper lobe, favored to be infectious/inflammatory. Postoperative seroma in the right breast, non FDG avid. Benign right adrenal adenoma.  CT ABDOMEN PELVIS W CONTRAST performed  on 12/07/2021 Nodular enhancing focus at the base of the urinary bladder, measuring 9 x 8 mm. This is suspicious for urothelial neoplasm and further evaluation with cystoscopy is suggested. Signs of renal cortical scarring on the LEFT new from previous imaging. Background hepatic steatosis. Aortic atherosclerosis.   CT CHEST NODULE FOLLOW UP WO CONTRAST performed on 12/07/2021 1.9 x 1.4 x 2.5 cm irregular spiculated lesion in the anterior right upper lobe, tethered to the anterior pleura. If the patient has had prior radiation to this region, scarring would be a possibility but neoplasm could certainly have this appearance. PET-CT recommended to further evaluate. 9 mm short axis right paratracheal lymph node, nonspecific. 6.5 x 4.1 x 6.6 cm fluid collection in the lateral right breast. In the setting of prior lumpectomy, this could represent a chronic seroma or hematoma. 2.6 cm left thyroid nodule. 2.3 cm right adrenal adenoma.  No follow-up recommended. Aortic atherosclerosis   DIAGNOSTIC RADIOGRAPHS OF CHEST 2 VIEWS performed on 11/08/2021 Large lung volumes with chronic interstitial coarsening (COPD). There is no edema, consolidation, effusion, or pneumothorax.  Vague nodular density over the right upper chest, likely anterior on the lateral view, measuring 18 mm, not seen on prior.  Normal heart size and mediastinal contours  Impression and Plan:  Michelle Moore has been referred for pre-anesthesia review and clearance prior to her undergoing the planned anesthetic and procedural courses. Available labs, pertinent testing, and imaging results were personally reviewed by me. This patient has been  appropriately cleared by cardiology with an overall LOW risk of significant perioperative cardiovascular complications.  Based on clinical review performed today (03/01/22), barring any significant acute changes in the patient's overall condition, it is anticipated that she will be able to proceed with the planned surgical intervention. Any acute changes in clinical condition may necessitate her procedure being postponed and/or cancelled. Patient will meet with anesthesia team (MD and/or CRNA) on the day of her procedure for preoperative evaluation/assessment. Questions regarding anesthetic course will be fielded at that time.   Pre-surgical instructions were reviewed with the patient during her PAT appointment and questions were fielded by PAT clinical staff. Patient was advised that if any questions or concerns arise prior to her procedure then she should return a call to PAT and/or her surgeon's office to discuss.  [name], MSN, APRN, FNP-C, CEN [facility]  Peri-operative Services Nurse Practitioner Phone: [phone] Fax: [phone] 03/01/22 10:04 AM  NOTE: This note has been prepared using Dragon dictation software. Despite my best ability to proofread, there is always the potential that unintentional transcriptional errors may still occur from this process.

## 2022-03-01 LAB — SARS CORONAVIRUS 2 (TAT 6-24 HRS): SARS Coronavirus 2: NEGATIVE

## 2022-03-01 MED ORDER — SODIUM CHLORIDE 0.9 % IV SOLN: Freq: Once | INTRAVENOUS | Status: DC

## 2022-03-01 MED ORDER — GEMCITABINE CHEMO FOR BLADDER INSTILLATION 2000 MG
2000.0000 mg | Freq: Once | INTRAVENOUS | Status: DC
  Filled 2022-03-01: qty 52.6

## 2022-03-01 MED ORDER — CHLORHEXIDINE GLUCONATE 0.12 % MT SOLN
15.0000 mL | Freq: Once | OROMUCOSAL | Status: AC
  Administered 2022-03-02: 15 mL via OROMUCOSAL

## 2022-03-01 MED ORDER — LACTATED RINGERS IV SOLN: INTRAVENOUS | Status: DC

## 2022-03-01 MED ORDER — ORAL CARE MOUTH RINSE: 15.0000 mL | Freq: Once | OROMUCOSAL | Status: AC

## 2022-03-02 ENCOUNTER — Ambulatory Visit: Payer: Medicare HMO | Admitting: Urgent Care

## 2022-03-02 ENCOUNTER — Ambulatory Visit
Admission: RE | Admit: 2022-03-02 | Discharge: 2022-03-02 | Disposition: A | Discharge status: Home / Self Care | Payer: Medicare HMO | Attending: Pulmonary Disease | Admitting: Pulmonary Disease

## 2022-03-02 ENCOUNTER — Other Ambulatory Visit: Payer: Self-pay

## 2022-03-02 ENCOUNTER — Encounter: Payer: Self-pay | Admitting: Pulmonary Disease

## 2022-03-02 ENCOUNTER — Encounter
Admission: RE | Disposition: A | Discharge status: Home / Self Care | Payer: Self-pay | Source: Home / Self Care | Attending: Pulmonary Disease

## 2022-03-02 ENCOUNTER — Inpatient Hospital Stay: Payer: Medicare HMO

## 2022-03-02 DIAGNOSIS — C3491 Malignant neoplasm of unspecified part of right bronchus or lung: Secondary | ICD-10-CM | POA: Diagnosis not present

## 2022-03-02 DIAGNOSIS — Z85118 Personal history of other malignant neoplasm of bronchus and lung: Secondary | ICD-10-CM | POA: Diagnosis not present

## 2022-03-02 DIAGNOSIS — R590 Localized enlarged lymph nodes: Secondary | ICD-10-CM | POA: Diagnosis not present

## 2022-03-02 DIAGNOSIS — I252 Old myocardial infarction: Secondary | ICD-10-CM | POA: Insufficient documentation

## 2022-03-02 DIAGNOSIS — C3411 Malignant neoplasm of upper lobe, right bronchus or lung: Secondary | ICD-10-CM | POA: Diagnosis not present

## 2022-03-02 DIAGNOSIS — G4733 Obstructive sleep apnea (adult) (pediatric): Secondary | ICD-10-CM | POA: Diagnosis not present

## 2022-03-02 DIAGNOSIS — Z955 Presence of coronary angioplasty implant and graft: Secondary | ICD-10-CM | POA: Insufficient documentation

## 2022-03-02 DIAGNOSIS — R918 Other nonspecific abnormal finding of lung field: Secondary | ICD-10-CM | POA: Diagnosis not present

## 2022-03-02 DIAGNOSIS — K219 Gastro-esophageal reflux disease without esophagitis: Secondary | ICD-10-CM | POA: Insufficient documentation

## 2022-03-02 DIAGNOSIS — I739 Peripheral vascular disease, unspecified: Secondary | ICD-10-CM | POA: Diagnosis not present

## 2022-03-02 DIAGNOSIS — T884XXA Failed or difficult intubation, initial encounter: Secondary | ICD-10-CM

## 2022-03-02 DIAGNOSIS — D494 Neoplasm of unspecified behavior of bladder: Secondary | ICD-10-CM

## 2022-03-02 DIAGNOSIS — I2510 Atherosclerotic heart disease of native coronary artery without angina pectoris: Secondary | ICD-10-CM | POA: Diagnosis not present

## 2022-03-02 DIAGNOSIS — F17200 Nicotine dependence, unspecified, uncomplicated: Secondary | ICD-10-CM | POA: Diagnosis not present

## 2022-03-02 DIAGNOSIS — Z87891 Personal history of nicotine dependence: Secondary | ICD-10-CM | POA: Insufficient documentation

## 2022-03-02 DIAGNOSIS — I6529 Occlusion and stenosis of unspecified carotid artery: Secondary | ICD-10-CM | POA: Insufficient documentation

## 2022-03-02 DIAGNOSIS — J449 Chronic obstructive pulmonary disease, unspecified: Secondary | ICD-10-CM | POA: Diagnosis not present

## 2022-03-02 DIAGNOSIS — I10 Essential (primary) hypertension: Secondary | ICD-10-CM | POA: Insufficient documentation

## 2022-03-02 HISTORY — DX: Other seasonal allergic rhinitis: J30.2

## 2022-03-02 HISTORY — DX: Endocarditis, valve unspecified: I38

## 2022-03-02 HISTORY — DX: Long term (current) use of unspecified immunomodulators and immunosuppressants: Z79.60

## 2022-03-02 HISTORY — DX: Obstructive sleep apnea (adult) (pediatric): G47.33

## 2022-03-02 HISTORY — DX: Hyperlipidemia, unspecified: E78.5

## 2022-03-02 HISTORY — DX: Depression, unspecified: F32.A

## 2022-03-02 HISTORY — DX: Rheumatoid arthritis, unspecified: M06.9

## 2022-03-02 HISTORY — DX: Essential (primary) hypertension: I10

## 2022-03-02 HISTORY — DX: Other specified disorders of bone density and structure, unspecified site: M85.80

## 2022-03-02 HISTORY — PX: VIDEO BRONCHOSCOPY WITH ENDOBRONCHIAL ULTRASOUND: SHX6177

## 2022-03-02 HISTORY — DX: Atherosclerosis of aorta: I70.0

## 2022-03-02 HISTORY — DX: Benign neoplasm, unspecified site: D36.9

## 2022-03-02 HISTORY — DX: Other long term (current) drug therapy: Z79.899

## 2022-03-02 HISTORY — DX: Other forms of dyspnea: R06.09

## 2022-03-02 HISTORY — DX: Headache, unspecified: R51.9

## 2022-03-02 HISTORY — DX: Failed or difficult intubation, initial encounter: T88.4XXA

## 2022-03-02 SURGERY — BRONCHOSCOPY, WITH EBUS
Anesthesia: General

## 2022-03-02 MED ORDER — PROPOFOL 10 MG/ML IV BOLUS
INTRAVENOUS | Status: DC | PRN
  Administered 2022-03-02: 50 mg via INTRAVENOUS
  Administered 2022-03-02: 150 mg via INTRAVENOUS

## 2022-03-02 MED ORDER — ONDANSETRON HCL 4 MG/2ML IJ SOLN: 4.0000 mg | Freq: Once | INTRAMUSCULAR | Status: DC | PRN

## 2022-03-02 MED ORDER — MIDAZOLAM HCL 2 MG/2ML IJ SOLN
INTRAMUSCULAR | Status: AC
  Filled 2022-03-02: qty 2

## 2022-03-02 MED ORDER — ONDANSETRON HCL 4 MG/2ML IJ SOLN
INTRAMUSCULAR | Status: AC
  Filled 2022-03-02: qty 2

## 2022-03-02 MED ORDER — DEXAMETHASONE SODIUM PHOSPHATE 10 MG/ML IJ SOLN
INTRAMUSCULAR | Status: AC
  Filled 2022-03-02: qty 1

## 2022-03-02 MED ORDER — SUGAMMADEX SODIUM 500 MG/5ML IV SOLN
INTRAVENOUS | Status: AC
  Filled 2022-03-02: qty 5

## 2022-03-02 MED ORDER — SUGAMMADEX SODIUM 200 MG/2ML IV SOLN
INTRAVENOUS | Status: DC | PRN
  Administered 2022-03-02: 256 mg via INTRAVENOUS

## 2022-03-02 MED ORDER — ROCURONIUM BROMIDE 100 MG/10ML IV SOLN
INTRAVENOUS | Status: DC | PRN
  Administered 2022-03-02: 50 mg via INTRAVENOUS

## 2022-03-02 MED ORDER — GLYCOPYRROLATE 0.2 MG/ML IJ SOLN
INTRAMUSCULAR | Status: AC
  Filled 2022-03-02: qty 1

## 2022-03-02 MED ORDER — LIDOCAINE HCL (CARDIAC) PF 100 MG/5ML IV SOSY
PREFILLED_SYRINGE | INTRAVENOUS | Status: DC | PRN
  Administered 2022-03-02: 80 mg via INTRAVENOUS

## 2022-03-02 MED ORDER — LIDOCAINE HCL (PF) 2 % IJ SOLN
INTRAMUSCULAR | Status: AC
  Filled 2022-03-02: qty 5

## 2022-03-02 MED ORDER — GLYCOPYRROLATE 0.2 MG/ML IJ SOLN
INTRAMUSCULAR | Status: DC | PRN
  Administered 2022-03-02: .2 mg via INTRAVENOUS

## 2022-03-02 MED ORDER — MIDAZOLAM HCL 2 MG/2ML IJ SOLN
INTRAMUSCULAR | Status: DC | PRN
  Administered 2022-03-02: 2 mg via INTRAVENOUS

## 2022-03-02 MED ORDER — ONDANSETRON HCL 4 MG/2ML IJ SOLN
INTRAMUSCULAR | Status: DC | PRN
  Administered 2022-03-02: 4 mg via INTRAVENOUS

## 2022-03-02 MED ORDER — IPRATROPIUM-ALBUTEROL 0.5-2.5 (3) MG/3ML IN SOLN
3.0000 mL | Freq: Once | RESPIRATORY_TRACT | Status: AC
  Administered 2022-03-02: 3 mL via RESPIRATORY_TRACT

## 2022-03-02 MED ORDER — PROPOFOL 1000 MG/100ML IV EMUL
INTRAVENOUS | Status: AC
  Filled 2022-03-02: qty 300

## 2022-03-02 MED ORDER — EPHEDRINE SULFATE (PRESSORS) 50 MG/ML IJ SOLN
INTRAMUSCULAR | Status: DC | PRN
  Administered 2022-03-02: 5 mg via INTRAVENOUS

## 2022-03-02 MED ORDER — EPHEDRINE 5 MG/ML INJ
INTRAVENOUS | Status: AC
  Filled 2022-03-02: qty 5

## 2022-03-02 MED ORDER — FENTANYL CITRATE (PF) 100 MCG/2ML IJ SOLN: 25.0000 ug | INTRAMUSCULAR | Status: DC | PRN

## 2022-03-02 MED ORDER — LIDOCAINE HCL (PF) 2 % IJ SOLN
INTRAMUSCULAR | Status: AC
  Filled 2022-03-02: qty 15

## 2022-03-02 MED ORDER — ROCURONIUM BROMIDE 10 MG/ML (PF) SYRINGE
PREFILLED_SYRINGE | INTRAVENOUS | Status: AC
  Filled 2022-03-02: qty 10

## 2022-03-02 MED ORDER — FENTANYL CITRATE (PF) 100 MCG/2ML IJ SOLN
INTRAMUSCULAR | Status: AC
  Filled 2022-03-02: qty 2

## 2022-03-02 MED ORDER — FENTANYL CITRATE (PF) 100 MCG/2ML IJ SOLN
INTRAMUSCULAR | Status: DC | PRN
Start: 2022-03-02 — End: 2022-03-02
  Administered 2022-03-02 (×2): 50 ug via INTRAVENOUS

## 2022-03-02 MED ORDER — ESMOLOL HCL 100 MG/10ML IV SOLN
INTRAVENOUS | Status: DC | PRN
  Administered 2022-03-02: 30 mg via INTRAVENOUS

## 2022-03-02 MED ORDER — LIDOCAINE HCL (PF) 2 % IJ SOLN
INTRAMUSCULAR | Status: AC
Start: 2022-03-02 — End: ?
  Filled 2022-03-02: qty 10

## 2022-03-02 MED ORDER — IPRATROPIUM-ALBUTEROL 0.5-2.5 (3) MG/3ML IN SOLN
RESPIRATORY_TRACT | Status: AC
  Filled 2022-03-02: qty 3

## 2022-03-02 MED ORDER — PHENYLEPHRINE HCL (PRESSORS) 10 MG/ML IV SOLN
INTRAVENOUS | Status: DC | PRN
  Administered 2022-03-02 (×2): 80 ug via INTRAVENOUS
  Administered 2022-03-02: 120 ug via INTRAVENOUS

## 2022-03-02 MED ORDER — DEXAMETHASONE SODIUM PHOSPHATE 10 MG/ML IJ SOLN
INTRAMUSCULAR | Status: DC | PRN
  Administered 2022-03-02: 10 mg via INTRAVENOUS

## 2022-03-02 MED ORDER — CHLORHEXIDINE GLUCONATE 0.12 % MT SOLN
OROMUCOSAL | Status: AC
  Filled 2022-03-02: qty 15

## 2022-03-02 NOTE — Transfer of Care (Signed)
Immediate Anesthesia Transfer of Care Note  Patient: Michelle Moore  Procedure(s) Performed: VIDEO BRONCHOSCOPY WITH ENDOBRONCHIAL ULTRASOUND  Patient Location: PACU  Anesthesia Type:General  Level of Consciousness: awake, alert  and oriented  Airway & Oxygen Therapy: Patient Spontanous Breathing and Patient connected to face mask oxygen  Post-op Assessment: Report given to RN and Post -op Vital signs reviewed and stable  Post vital signs: Reviewed and stable  Last Vitals:  Vitals Value Taken Time  BP 142/72 03/02/22 1333  Temp    Pulse 101 03/02/22 1338  Resp 20 03/02/22 1338  SpO2 96 % 03/02/22 1338  Vitals shown include unvalidated device data.  Last Pain:  Vitals:   03/02/22 1117  TempSrc: Temporal  PainSc: 0-No pain         Complications:  Encounter Notable Events  Notable Event Outcome Phase Comment  Difficult to intubate - expected  Intraprocedure Filed from anesthesia note documentation.

## 2022-03-02 NOTE — Discharge Instructions (Signed)

## 2022-03-02 NOTE — Op Note (Signed)
PROCEDURE: ENDOBRONCHIAL ULTRASOUND WITH TBNA   PROCEDURE DATE: 03/02/2022  TIME:  NAME:  Michelle Moore  DOB:October 01, 1955  MRN: [id number] LOC:  ARPO/None    HOSP DAY: N/A    Indications/Preliminary Diagnosis: 66 year old former smoker with recent diagnosis of right upper lobe squamous cell carcinoma and ductal carcinoma of the right breast in the past.  With mediastinal adenopathy (low paratracheal, precarinal) of indeterminate uptake on PET/CT.  As for EBUS with TBNA for lung cancer staging.  Preliminary diagnosis: Mediastinal adenopathy, rule out CA.   Consent: (Place X beside choice/s below)  The benefits, risks and possible complications of the procedure were        explained to:  _X__ patient  ___ patient's family  ___ other:___________  who verbalized understanding and gave:  ___ verbal  ___ written  _X__ verbal and written  ___ telephone  ___ other:________ consent.      Unable to obtain consent; procedure performed on emergent basis.     Other:    Benefits, limitations and potential complications of the procedure were discussed with the patient/family.  Complications from bronchoscopy are rare and most often minor, but if they occur they may include breathing difficulty, vocal cord spasm, hoarseness, slight fever, vomiting, dizziness, bronchospasm, infection, low blood oxygen, bleeding from biopsy site, or an allergic reaction to medications.  It is uncommon for patients to experience other more serious complications for example: Collapsed lung requiring chest tube placement, respiratory failure, heart attack and/or cardiac arrhythmia.  Patient agrees to proceed.    Anesthesia type: General endotracheal  Surgeon: Renold Don, MD Assistant/Scrub: Liborio Nixon, RRT Anesthesiologist/CRNA: Vashti Hey, MD Demetrius Charity, CRNA Cytotechnology: [facility]   PROCEDURE DETAILS: Patient was taken to Procedure Room 2 (Bronchoscopy Suite) in the OR area.  Appropriate Timeout  performed and correct patient, name, ID and laterality confirmed.  Patient was inducted under general anesthesia and intubated with an 8.5 ET tube without difficulty.  Once the patient was under adequate general anesthesia a Portex adapter was placed in the ET tube flange.  Through the Portex adapter the Olympus video therapeutic bronchoscope was then advanced.  The visible distal trachea was normal, carina appeared sharp.  Examination of the right tracheobronchial tree minimal inspissated secretions that required lavage.  The right upper lobe , right middle lobe, lower lobe bronchi were free of endobronchial lesions or any other abnormality.  At this point bronchoscope was introduced into the left mainstem and examination revealed no endobronchial lesions on the left tracheobronchial tree.  There were significant inspissated secretions on the left lower lobe and these were lavaged till clear.  After completing this, the bronchoscope was switched to an endobronchial ultrasound scope (EBUS scope) and the mediastinum was examined.  There was a 1.4 x 1.0 cm lymph node noted on the right precarinal space (lower paratracheal) or station 4R.  There was no significant adenopathy noted in the subcarinal area.  No adenopathy noted on the right or left hilar areas.  We proceeded to sample the 4R station.  ROSE revealed sampling of lymph node.  A total of 6 passes were performed these were placed in preservative for further analysis.  Having completed this, the EBUS scope was retrieved and again replaced for the therapeutic video bronchoscope.  The airway was then examined thoroughly to ensure adequate hemostasis.  After examination and inspection and noting excellent hemostasis, the patient received 9 mL of 1% lidocaine via bronchial lavage prior to retrieving the bronchoscope.  At this point the  procedure was terminated and the patient was allowed to emerge from general anesthesia.  Patient was extubated in the procedure  room and transported to the PACU in satisfactory condition.  The patient did not have any complaints of chest pain or shortness of breath postprocedure.  Postprocedure examination shows symmetrical lung sounds. Patient tolerated the procedure well.   SPECIMENS (Sites): (Place X beside choice below)  Specimens Description   No Specimens Obtained     Washings    Lavage    Biopsies   X Fine Needle Aspirates TBNA X 6 R precarinal (station 4 R)   Brushings    Sputum    FINDINGS:   Station 4R node:     Needle and lymph node during biopsy:    ESTIMATED BLOOD LOSS: Less than 2 mL   COMPLICATIONS/RESOLUTION: none.    IMPRESSION:POST-PROCEDURE DX:  Mediastinal adenopathy,4R station, status post EBUS/TBNA Squamous cell carcinoma right upper lobe   RECOMMENDATION/PLAN:  Follow-up pathology report Keep oncology and pulmonary appointments   C. Derrill Kay, MD Advanced Bronchoscopy PCCM [facility]    *This note was dictated using voice recognition software/Dragon.  Despite best efforts to proofread, errors can occur which can change the meaning.  Any change was purely unintentional.

## 2022-03-02 NOTE — Anesthesia Procedure Notes (Addendum)
Procedure Name: Intubation Date/Time: 03/02/2022 12:45 PM  Performed by: Demetrius Charity, CRNAPre-anesthesia Checklist: Patient identified, Patient being monitored, Timeout performed, Emergency Drugs available and Suction available Patient Re-evaluated:Patient Re-evaluated prior to induction Oxygen Delivery Method: Circle system utilized Preoxygenation: Pre-oxygenation with 100% oxygen Induction Type: IV induction Ventilation: Mask ventilation without difficulty Laryngoscope Size: 3 and McGraph Grade View: Grade III Tube type: Oral Tube size: 8.5 mm Number of attempts: 1 Airway Equipment and Method: Stylet, Video-laryngoscopy and Bougie stylet Placement Confirmation: ETT inserted through vocal cords under direct vision, positive ETCO2 and breath sounds checked- equal and bilateral Secured at: 22 cm Tube secured with: Tape Dental Injury: Teeth and Oropharynx as per pre-operative assessment  Difficulty Due To: Difficulty was anticipated Comments: Bougie used to pass ETT through cords.  BBS, ETCO2.

## 2022-03-02 NOTE — Interval H&P Note (Signed)
Patient presents for endobronchial ultrasound (EBUS) with [facility] for evaluation of a right paratracheal lymph node and evaluation of mediastinum with ultrasound.  Patient has known squamous cell carcinoma of the right upper lobe.  Benefits, limitations and potential complications of the procedure were discussed with the patient/family.  Complications from bronchoscopy are rare and most often minor, but if they occur they may include breathing difficulty, vocal cord spasm, hoarseness, slight fever, vomiting, dizziness, bronchospasm, infection, low blood oxygen, bleeding from biopsy site, or an allergic reaction to medications.  It is uncommon for patients to experience other more serious complications for example: Collapsed lung requiring chest tube placement, respiratory failure, heart attack and/or cardiac arrhythmia.  Patient agrees to proceed.  No changes from the prior history and physical performed 21 February 2022 report which is attached to this.  Renold Don, MD Advanced Bronchoscopy PCCM [facility]    *This note was dictated using voice recognition software/Dragon.  Despite best efforts to proofread, errors can occur which can change the meaning. Any transcriptional errors that result from this process are unintentional and may not be fully corrected at the time of dictation.

## 2022-03-02 NOTE — Anesthesia Preprocedure Evaluation (Signed)
Anesthesia Evaluation  Patient identified by MRN, date of birth, ID band Patient awake    Reviewed: Allergy & Precautions, H&P , NPO status , Patient's Chart, lab work & pertinent test results  Airway Mallampati: II  TM Distance: >3 FB Neck ROM: full    Dental no notable dental hx.    Pulmonary sleep apnea , COPD,  COPD inhaler, former smoker,  Pulm Nodule    Pulmonary exam normal breath sounds clear to auscultation       Cardiovascular hypertension, + CAD, + Past MI, + Cardiac Stents, + Peripheral Vascular Disease and + DOE  Normal cardiovascular exam Rhythm:regular Rate:Normal  Carotid stenosis   Neuro/Psych PSYCHIATRIC DISORDERS H/o PRES syndrome  Neuromuscular disease    GI/Hepatic GERD  Medicated,  Endo/Other  Left thyroid nodule  Renal/GU    Bladder Mass: 9 x 8 mm nodular enhancing focus at the base of the bladder; suspicious for urothelial neoplasm.    Musculoskeletal  (+) Arthritis , Rheumatoid disorders,  on MTX   Abdominal   Peds  Hematology   Anesthesia Other Findings CSR 11/08/2021: 34mm nodular density RUL. b.) CT chest 12/07/2021: 1.9 x 1.4 x 2.5 spiculated lesion anterior RUL. c.) PET CT 01/06/2022: 2.2 x 1.p spiculated anterior RUL mass (SUV max 16.4)  PAT note reviewed  Reproductive/Obstetrics                             Anesthesia Physical  Anesthesia Plan  ASA: III  Anesthesia Plan: General   Post-op Pain Management:    Induction: Intravenous  PONV Risk Score and Plan: 3 and Ondansetron, Dexamethasone and Midazolam  Airway Management Planned: Oral ETT  Additional Equipment:   Intra-op Plan:   Post-operative Plan: Extubation in OR  Informed Consent:     Dental Advisory Given  Plan Discussed with: CRNA  Anesthesia Plan Comments:         Anesthesia Quick Evaluation

## 2022-03-03 LAB — CYTOLOGY - NON PAP

## 2022-03-03 NOTE — Anesthesia Postprocedure Evaluation (Signed)
Anesthesia Post Note  Patient: Michelle Moore  Procedure(s) Performed: VIDEO BRONCHOSCOPY WITH ENDOBRONCHIAL ULTRASOUND  Patient location during evaluation: PACU Anesthesia Type: General Level of consciousness: awake and alert Pain management: pain level controlled Vital Signs Assessment: post-procedure vital signs reviewed and stable Respiratory status: spontaneous breathing, nonlabored ventilation, respiratory function stable and patient connected to nasal cannula oxygen Cardiovascular status: blood pressure returned to baseline and stable Postop Assessment: no apparent nausea or vomiting Anesthetic complications: yes   Encounter Notable Events  Notable Event Outcome Phase Comment  Difficult to intubate - expected  Intraprocedure Filed from anesthesia note documentation.     Last Vitals:  Vitals:   03/02/22 1400 03/02/22 1410  BP: 133/66 140/79  Pulse: 90 92  Resp: 12 14  Temp: (!) 36.4 C (!) 36.3 C  SpO2: 95% 94%    Last Pain:  Vitals:   03/02/22 1410  TempSrc: Temporal  PainSc: 0-No pain                 Molli Barrows

## 2022-03-07 ENCOUNTER — Other Ambulatory Visit: Payer: Self-pay | Admitting: *Deleted

## 2022-03-07 ENCOUNTER — Other Ambulatory Visit: Payer: Self-pay | Admitting: Thoracic Surgery (Cardiothoracic Vascular Surgery)

## 2022-03-07 ENCOUNTER — Encounter: Payer: Self-pay | Admitting: *Deleted

## 2022-03-07 ENCOUNTER — Ambulatory Visit: Payer: Medicare HMO | Admitting: Thoracic Surgery (Cardiothoracic Vascular Surgery)

## 2022-03-07 ENCOUNTER — Encounter: Payer: Self-pay | Admitting: Thoracic Surgery (Cardiothoracic Vascular Surgery)

## 2022-03-07 VITALS — BP 121/80 | HR 77 | Resp 20 | Ht 60.0 in | Wt 140.5 lb

## 2022-03-07 DIAGNOSIS — C3491 Malignant neoplasm of unspecified part of right bronchus or lung: Secondary | ICD-10-CM

## 2022-03-07 MED ORDER — GABAPENTIN 100 MG PO CAPS: 100.0000 mg | ORAL_CAPSULE | Freq: Two times a day (BID) | ORAL | 0 refills | Status: DC

## 2022-03-07 NOTE — Progress Notes (Signed)
PCP is Lavera Guise, MD Referring Provider is Lloyd Huger, MD  Chief Complaint  Patient presents with   Lung Lesion    New patient consultation Chest CT 3/28, PET 4/27, CT BX 5/30, PFTs 5/3    HPI: Michelle Moore is sent for consultation regarding a squamous cell carcinoma of the right upper lobe.  Michelle Moore is a 66 year old woman with a past medical history significant for tobacco abuse, COPD, CAD, MI, thoracic aortic atherosclerosis, hypertension, hyperlipidemia, reflux, anxiety, depression, rheumatoid arthritis, bladder mass, thyroid nodule, and DCIS of the breast.  She has a 45-pack-year history of smoking prior to quitting in 2017.  She had COVID earlier this year.  In February she presented with a 3-week cough.  A chest x-ray showed right upper lobe opacity.  That led to a CT of the chest in late March which showed a 1.9 x 1.4 x 2.5 cm spiculated nodule in anterior upper lobe.  A PET/CT was then done about a month later which showed the nodule measured 2.2 x 1.9 cm and was markedly hypermetabolic with an SUV of 61.9.  There was mild uptake in a paratracheal node which was indeterminate.  There was some uptake in the right lateral seventh rib in the area of a fracture.  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  She then had an endobronchial ultrasound to evaluate the right paratracheal node.  Aspirations showed lymphoid cells but no evidence of metastasis.  She gets short of breath with exertion but can walk up a flight of stairs without stopping.  She recently has been having a burning sensation in her neck.  This is not associated with eating or exertion.  She says it started after she began using her Breztri inhaler.  She denies any change in appetite or weight loss.  No chest pain, pressure, or tightness.    Zubrod Score: At the time of surgery this patient's most appropriate activity status/level should be described as: []     0     Normal activity, no symptoms []     1    Restricted in physical strenuous activity but ambulatory, able to do out light work [x]     2    Ambulatory and capable of self care, unable to do work activities, up and about >50 % of waking hours                              []     3    Only limited self care, in bed greater than 50% of waking hours []     4    Completely disabled, no self care, confined to bed or chair []     5    Moribund  Past Medical History:  Diagnosis Date   Adenoma of right adrenal gland 12/07/2021   a.) CT chest 12/07/2021; measured 2.3 cm; not FDG avid on 01/06/2022 PET CT.   Anxiety    a.) uses BZO (alprazolam) PRN   Aortic atherosclerosis (HCC)    Arthritis    Bladder mass 12/07/2021   a.) 9 x 8 mm nodular enhancing focus at the base of the bladder; suspicious for urothelial neoplasm.   COPD (chronic obstructive pulmonary disease) (HCC)    Coronary artery disease 10/16/2009   a.) LHC/PCI 10/16/2009: EF 60%; 50% OM1, 99% dRCA (3.0 x 18 mm Xience V DES). b.) LHC 07/31/2014: EF 65%; 40% pLAD,  20% pLCx, 40% OM1, 30% oRCA, 50% pRCA, 20% mRCA, 10% ISR stent to dRCA; further intervention deferred opting for med. mgmt.   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 b.) s/p lumpectomy, adjuvant XT; current on extended AI (anastrozole) therapy   GERD (gastroesophageal reflux disease)    HLD (hyperlipidemia)    HTN (hypertension)    Left thyroid nodule 12/07/2021   a.) CT chest; measured 2.6 cm   Long term current use of immunosuppressive drug    a.) MTX for RA.   OSA on CPAP    Osteopenia    Personal history of radiation therapy    Pneumonia    PRES (posterior reversible encephalopathy syndrome) 07/14/2014   Pulmonary nodules 11/08/2021   a.) CSR 11/08/2021: 54mm nodular density RUL. b.) CT chest 12/07/2021: 1.9 x 1.4 x 2.5 spiculated lesion anterior RUL. c.) PET CT 01/06/2022: 2.2  x 1.p spiculated anterior RUL mass (SUV max 16.4)   Rheumatoid arthritis ([place])    a.) on MTX   Seasonal allergies    Seizures ([place]) 2014   a.) x 1 episode; etiology never determined.   Sinus headache    ST elevation myocardial infarction (STEMI) of inferior wall ([place]) 10/16/2009   a.) LHC/PCI 10/16/2009: EF 60%; 50% OM1, 99% dRCA (3.0 x 18 mm Xience V DES).   Tubular adenoma    Valvular heart disease     Past Surgical History:  Procedure Laterality Date   ABDOMINAL HYSTERECTOMY     ANTERIOR CERVICAL DECOMP/DISCECTOMY FUSION N/A 01/20/2014   Procedure: ANTERIOR CERVICAL DECOMPRESSION/DISCECTOMY FUSION 2 LEVELS cervical five/six six Tarry Kos;  Surgeon: Ophelia Charter, MD;  Location: [facility];  Service: Neurosurgery;  Laterality: N/A;   BACK SURGERY     lumbar   BREAST BIOPSY Right 02/01/2018   x shape, DCIS    BREAST BIOPSY  01/16/2019   coil, BENIGN MAMMARY TISSUE WITH MODERATE STROMAL FIBROSIS AND COARSE DYSTROPHIC CALCIFICATIONS of the RIGHT breast   BREAST LUMPECTOMY Right 03/07/2018   Procedure: BREAST LUMPECTOMY/ RE EXCISION;  Surgeon: Herbert Pun, MD;  Location: [facility];  Service: General;  Laterality: Right;   CARDIAC CATHETERIZATION Left 07/31/2014   Procedure: CARDIAC CATHETERIZATION; Location: [facility]; Surgeon: Serafina Royals, MD   COLONOSCOPY WITH PROPOFOL N/A 02/11/2021   Procedure: COLONOSCOPY WITH PROPOFOL;  Surgeon: Lucilla Lame, MD;  Location: [facility];  Service: Endoscopy;  Laterality: N/A;  Requests Early   CORONARY ANGIOPLASTY WITH STENT PLACEMENT Left 10/16/2009   Procedure: CORONARY ANGIOPLASTY WITH STENT PLACEMENT; Location: [place]; Surgeon: Katrine Coho, MD   LASIK Bilateral    PARTIAL MASTECTOMY WITH NEEDLE LOCALIZATION Right 02/21/2018   Procedure: PARTIAL MASTECTOMY WITH NEEDLE LOCALIZATION;  Surgeon: Herbert Pun, MD;  Location: [facility];  Service: General;  Laterality: Right;   POLYPECTOMY N/A 02/11/2021   Procedure:  POLYPECTOMY;  Surgeon: Lucilla Lame, MD;  Location: [place];  Service: Endoscopy;  Laterality: N/A;   ROTATOR CUFF REPAIR Left    SHOULDER ARTHROSCOPY WITH ROTATOR CUFF REPAIR AND OPEN BICEPS TENODESIS Right 08/07/2019   Procedure: SHOULDER ARTHROSCOPY WITH DEBRIDEMENT, DECOMPRESSION, ROTATOR CUFF REPAIR AND BICEPS TENODESIS. - RNFA;  Surgeon: Corky Mull, MD;  Location: [facility];  Service: Orthopedics;  Laterality: Right;   TOE SURGERY Right    has pin and plate in it   VIDEO BRONCHOSCOPY WITH ENDOBRONCHIAL ULTRASOUND N/A 03/02/2022   Procedure: VIDEO  BRONCHOSCOPY WITH ENDOBRONCHIAL ULTRASOUND;  Surgeon: Tyler Pita, MD;  Location: [facility];  Service: Cardiopulmonary;  Laterality: N/A;    Family History  Problem Relation Age of Onset   Breast cancer Cousin 45       bilateral at 42 and 40; daughter of maternal aunt who was unaffected   Lung cancer Maternal Aunt        dx 66s; deceased 52s; smoker   Heart attack Father        deceased 60   Stomach cancer Maternal Grandmother     Social History Social History   Tobacco Use   Smoking status: Former    Packs/day: 1.00    Years: 30.00    Total pack years: 30.00    Types: Cigarettes    Quit date: 09/13/2015    Years since quitting: 6.4   Smokeless tobacco: Never  Vaping Use   Vaping Use: Never used  Substance Use Topics   Alcohol use: Yes    Comment: occasional   Drug use: No    Current Outpatient Medications  Medication Sig Dispense Refill   albuterol (VENTOLIN HFA) 108 (90 Base) MCG/ACT inhaler Inhale 1-2 puffs into the lungs every 6 (six) hours as needed for wheezing or shortness of breath. 1 g 1   alendronate (FOSAMAX) 70 MG tablet Take 1 tablet (70 mg total) by mouth once a week. Take with a full glass of water on an empty stomach. Stand or sit upright for 1 hour after taking 12 tablet 1   ALPRAZolam (XANAX) 0.25 MG tablet Take 1 tablet (0.25 mg total) by mouth 2 (two) times daily as needed for anxiety.  60 tablet 1   anastrozole (ARIMIDEX) 1 MG tablet TAKE (1) TABLET BY MOUTH EVERY DAY 90 tablet 1   aspirin-acetaminophen-caffeine (EXCEDRIN MIGRAINE) 315-176-16 MG tablet Take by mouth every 6 (six) hours as needed for headache.     Budeson-Glycopyrrol-Formoterol (BREZTRI AEROSPHERE) 160-9-4.8 MCG/ACT AERO Inhale 2 puffs into the lungs in the morning and at bedtime. 5.9 g 0   cetirizine (ZYRTEC) 10 MG tablet Take 10 mg by mouth daily as needed for allergies.      fluticasone (FLONASE) 50 MCG/ACT nasal spray Place 2 sprays into both nostrils daily as needed for allergies. 60 mL 1   folic acid (FOLVITE) 1 MG tablet Take 2 mg by mouth daily.     gabapentin (NEURONTIN) 100 MG capsule Take 1 capsule (100 mg total) by mouth 2 (two) times daily. 60 capsule 0   methotrexate (RHEUMATREX) 2.5 MG tablet Take 20 mg by mouth every Wednesday.      montelukast (SINGULAIR) 10 MG tablet Take 1 tablet (10 mg total) by mouth at bedtime. 90 tablet 1   omeprazole (PRILOSEC) 40 MG capsule Take 1 capsule (40 mg total) by mouth daily. 30 capsule 3   potassium chloride (KLOR-CON) 10 MEQ tablet Take 1 tablet (10 mEq total) by mouth daily. 90 tablet 1   sertraline (ZOLOFT) 100 MG tablet Take 1 tablet (100 mg total) by mouth daily. 30 tablet 3   simvastatin (ZOCOR) 40 MG tablet Take 1 tablet (40 mg total) by mouth at bedtime. 90 tablet 3   traMADol (ULTRAM) 50 MG tablet      triamcinolone cream (KENALOG) 0.1 % Apply 1 application. topically 2 (two) times daily. To affected area. 45 g 0   No current facility-administered medications for this visit.    Allergies  Allergen Reactions   Other     BANDAIDS-OF LEFT  ON FOR AN EXTENDED PERIOD OF TIME    Review of Systems  Constitutional:  Negative for activity change, fatigue and unexpected weight change.  HENT:  Negative for trouble swallowing and voice change.   Eyes:  Negative for visual disturbance.  Respiratory:  Positive for apnea (CPAP) and shortness of breath.    Cardiovascular:  Negative for chest pain.  Gastrointestinal:  Positive for abdominal pain.       Frequent heartburn (see neck pain)  Genitourinary:  Negative for difficulty urinating and dysuria.  Musculoskeletal:  Positive for arthralgias, joint swelling and neck pain (Burning pain in neck since starting Breztri).  Neurological:  Negative for seizures, syncope and weakness.  Hematological:  Negative for adenopathy. Bruises/bleeds easily.  Psychiatric/Behavioral:  Positive for dysphoric mood. The patient is nervous/anxious.     BP 121/80 (BP Location: Left Arm, Patient Position: Sitting, Cuff Size: Normal)   Pulse 77   Resp 20   Ht 5' (1.524 m)   Wt 140 lb 8 oz (63.7 kg)   SpO2 94% Comment: RA  BMI 27.44 kg/m  Physical Exam Vitals reviewed.  Constitutional:      General: She is not in acute distress.    Appearance: Normal appearance.  HENT:     Head: Normocephalic and atraumatic.  Eyes:     General: No scleral icterus.    Extraocular Movements: Extraocular movements intact.  Cardiovascular:     Rate and Rhythm: Normal rate and regular rhythm.     Pulses: Normal pulses.     Heart sounds: Normal heart sounds. No murmur heard. Pulmonary:     Effort: Pulmonary effort is normal. No respiratory distress.     Breath sounds: Normal breath sounds. No wheezing.  Abdominal:     General: There is no distension.     Palpations: Abdomen is soft.     Tenderness: There is no abdominal tenderness.  Musculoskeletal:     Cervical back: Neck supple.  Lymphadenopathy:     Cervical: No cervical adenopathy.  Skin:    General: Skin is warm and dry.  Neurological:     General: No focal deficit present.     Mental Status: She is alert and oriented to person, place, and time.      Diagnostic Tests: NUCLEAR MEDICINE PET SKULL BASE TO THIGH   TECHNIQUE: 7.0 mCi F-18 FDG was injected intravenously. Full-ring PET imaging was performed from the skull base to thigh after the radiotracer.  CT data was obtained and used for attenuation correction and anatomic localization.   Fasting blood glucose: 102 mg/dl   COMPARISON:  CT chest abdomen pelvis dated 11/29/2021   FINDINGS: Mediastinal blood pool activity: SUV max 3.0   Liver activity: SUV max NA   NECK: No hypermetabolic cervical lymphadenopathy.   Incidental CT findings: none   CHEST: 2.2 x 1.9 cm spiculated nodule in the anterior right upper lobe (series 7/image 27), abutting the right anterior chest wall, max SUV 16.4. This appearance favors primary bronchogenic neoplasm over metastasis.   Bandlike patchy opacity in the anterior left upper lobe (series 7/image 37), max SUV 3.7, new from recent prior and favored to be infectious/inflammatory.   Small mediastinal nodes, including a dominant 12 mm short axis low right paratracheal node (series 4/image 60), max SUV 4.5.   Postprocedural changes in the lateral right breast with associated 6.4 x 4.5 cm seroma (series 4/image 72), non FDG avid.   Incidental CT findings: Atherosclerotic calcifications of the aortic arch. Three vessel coronary atherosclerosis.  ABDOMEN/PELVIS: No abnormal hypermetabolism in the liver, spleen, pancreas, or adrenal glands.   No hypermetabolic abdominopelvic lymphadenopathy.   Incidental CT findings: 2.3 cm right adrenal adenoma. Atherosclerotic calcifications of the abdominal aorta and branch vessels. Status post hysterectomy.   SKELETON: Destructive changes involving the right lateral 7th rib (series 4/image 82), max SUV 4.7, worrisome for pathologic fracture.   Incidental CT findings: Cervical spine fixation hardware. Lumbar spine fixation hardware. Mild degenerative changes of the visualized thoracolumbar spine.   IMPRESSION: 2.2 cm spiculated nodule in the anterior right upper lobe, favoring primary bronchogenic neoplasm over metastasis.   Dominant 12 mm short axis low right paratracheal node, indeterminate.  Attention on follow-up is suggested.   Destructive changes involving the right lateral 7th rib, worrisome for pathologic fracture.   Bandlike opacity in the anterior left upper lobe, favored to be infectious/inflammatory.   Postoperative seroma in the right breast, non FDG avid.   Benign right adrenal adenoma.     Electronically Signed   By: Julian Hy M.D.   On: 01/07/2022 00:52  I personally reviewed the CT images.  There is a markedly hypermetabolic 2.2 cm spiculated nodule in the anterior right upper lobe adjacent to the chest wall but no definite invasion.  Mild uptake in a right paratracheal node.  Fracture of left seventh rib.  Impression: Michelle Moore is a 66 year old woman with a history of tobacco abuse, COPD, CAD, MI, thoracic aortic atherosclerosis, hypertension, hyperlipidemia, reflux, anxiety, depression, rheumatoid arthritis, bladder mass, thyroid nodule, and DCIS of the breast.   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  Possibly could be IIB (T3, N0) if chest wall invasion, scans do not rule this out but do not have any definitive evidence of an either.  In any event she has resectable disease.  I discussed potential surgical options with her.  She is concerned about robotic surgery since her brother had neuropathic pain after robotic lung resection.  I explained to her that neuropathic pain is a risk with robotic surgery or any other approach the lung resection and in fact would be higher likelihood with an open procedure.  I informed her of the need for general anesthesia, the incisions to be used, the use of surgical robot, use of drains to postoperatively, the expected hospital stay, and the overall recovery.  I informed her of the indications, risks, benefits, and alternatives.  She understands the risks include, but are not limited to death, MI, DVT, PE, bleeding, possible need for transfusion, infection, prolonged air leak, cardiac  arrhythmias, as well as possibility of other unforeseeable complications.  She understands that there is no way to guarantee a pain-free lung resection.  We will start her on low-dose gabapentin preoperatively at 100 mg twice daily and can adjust the dose if necessary if she has intercostal neuralgia.  Rib fracture-she is not aware of any trauma to the area.  It has been about 2 months since her PET.  We will get a repeat CT to look at that area.  It should have declared at this point whether or not it is a pathologic fracture.  If concerning we will do a biopsy prior to resection.  CAD-the burning pain in her neck is concerning for possible anginal equivalent.  She only started having this after she started Biron.  Difficult to know what to make of this discomfort as she is not able to associate it with any thing other than the new medication.  Not  exertional, does not follow meals, not associated with lying down.  Followed by Dr. Nehemiah Massed.  Scheduled for stress test tomorrow.  Plan: Stress test with Dr. Nehemiah Massed tomorrow Tentatively plan for robotic right VATS for right upper lobectomy, possible chest wall resection, possible conversion to thoracotomy on Thursday, 03/17/2022 Since it has been present over 2 months since her PET/CT will do another CT of the chest to reevaluate the rib fracture and also make sure there are no new findings of concern. Gabapentin 100 mg p.o. twice daily to start 1 week preoperatively  Melrose Nakayama, MD [facility] [phone]

## 2022-03-07 NOTE — H&P (View-Only) (Signed)
PCP is Lavera Guise, MD Referring Provider is Lloyd Huger, MD  Chief Complaint  Patient presents with   Lung Lesion    New patient consultation Chest CT 3/28, PET 4/27, CT BX 5/30, PFTs 5/3    HPI: Michelle Moore is sent for consultation regarding a squamous cell carcinoma of the right upper lobe.  Michelle Moore is a 66 year old woman with a past medical history significant for tobacco abuse, COPD, CAD, MI, thoracic aortic atherosclerosis, hypertension, hyperlipidemia, reflux, anxiety, depression, rheumatoid arthritis, bladder mass, thyroid nodule, and DCIS of the breast.  She has a 45-pack-year history of smoking prior to quitting in 2017.  She had COVID earlier this year.  In February she presented with a 3-week cough.  A chest x-ray showed right upper lobe opacity.  That led to a CT of the chest in late March which showed a 1.9 x 1.4 x 2.5 cm spiculated nodule in anterior upper lobe.  A PET/CT was then done about a month later which showed the nodule measured 2.2 x 1.9 cm and was markedly hypermetabolic with an SUV of 61.9.  There was mild uptake in a paratracheal node which was indeterminate.  There was some uptake in the right lateral seventh rib in the area of a fracture.  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  She then had an endobronchial ultrasound to evaluate the right paratracheal node.  Aspirations showed lymphoid cells but no evidence of metastasis.  She gets short of breath with exertion but can walk up a flight of stairs without stopping.  She recently has been having a burning sensation in her neck.  This is not associated with eating or exertion.  She says it started after she began using her Breztri inhaler.  She denies any change in appetite or weight loss.  No chest pain, pressure, or tightness.    Michelle Moore: At the time of surgery this patient's most appropriate activity status/level should be described as: []     0     Normal activity, no symptoms []     1    Restricted in physical strenuous activity but ambulatory, able to do out light work [x]     2    Ambulatory and capable of self care, unable to do work activities, up and about >50 % of waking hours                              []     3    Only limited self care, in bed greater than 50% of waking hours []     4    Completely disabled, no self care, confined to bed or chair []     5    Moribund  Past Medical History:  Diagnosis Date   Adenoma of right adrenal gland 12/07/2021   a.) CT chest 12/07/2021; measured 2.3 cm; not FDG avid on 01/06/2022 PET CT.   Anxiety    a.) uses BZO (alprazolam) PRN   Aortic atherosclerosis (HCC)    Arthritis    Bladder mass 12/07/2021   a.) 9 x 8 mm nodular enhancing focus at the base of the bladder; suspicious for urothelial neoplasm.   COPD (chronic obstructive pulmonary disease) (HCC)    Coronary artery disease 10/16/2009   a.) LHC/PCI 10/16/2009: EF 60%; 50% OM1, 99% dRCA (3.0 x 18 mm Xience V DES). b.) LHC 07/31/2014: EF 65%; 40% pLAD,  20% pLCx, 40% OM1, 30% oRCA, 50% pRCA, 20% mRCA, 10% ISR stent to dRCA; further intervention deferred opting for med. mgmt.   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 b.) s/p lumpectomy, adjuvant XT; current on extended AI (anastrozole) therapy   GERD (gastroesophageal reflux disease)    HLD (hyperlipidemia)    HTN (hypertension)    Left thyroid nodule 12/07/2021   a.) CT chest; measured 2.6 cm   Long term current use of immunosuppressive drug    a.) MTX for RA.   OSA on CPAP    Osteopenia    Personal history of radiation therapy    Pneumonia    PRES (posterior reversible encephalopathy syndrome) 07/14/2014   Pulmonary nodules 11/08/2021   a.) CSR 11/08/2021: 54mm nodular density RUL. b.) CT chest 12/07/2021: 1.9 x 1.4 x 2.5 spiculated lesion anterior RUL. c.) PET CT 01/06/2022: 2.2  x 1.p spiculated anterior RUL mass (SUV max 16.4)   Rheumatoid arthritis ([place])    a.) on MTX   Seasonal allergies    Seizures ([place]) 2014   a.) x 1 episode; etiology never determined.   Sinus headache    ST elevation myocardial infarction (STEMI) of inferior wall ([place]) 10/16/2009   a.) LHC/PCI 10/16/2009: EF 60%; 50% OM1, 99% dRCA (3.0 x 18 mm Xience V DES).   Tubular adenoma    Valvular heart disease     Past Surgical History:  Procedure Laterality Date   ABDOMINAL HYSTERECTOMY     ANTERIOR CERVICAL DECOMP/DISCECTOMY FUSION N/A 01/20/2014   Procedure: ANTERIOR CERVICAL DECOMPRESSION/DISCECTOMY FUSION 2 LEVELS cervical five/six six [place]ry Kos;  Surgeon: Ophelia Charter, MD;  Location: [facility];  Service: Neurosurgery;  Laterality: N/A;   BACK SURGERY     lumbar   BREAST BIOPSY Right 02/01/2018   x shape, DCIS    BREAST BIOPSY  01/16/2019   coil, BENIGN MAMMARY TISSUE WITH MODERATE STROMAL FIBROSIS AND COARSE DYSTROPHIC CALCIFICATIONS of the RIGHT breast   BREAST LUMPECTOMY Right 03/07/2018   Procedure: BREAST LUMPECTOMY/ RE EXCISION;  Surgeon: Herbert Pun, MD;  Location: [facility];  Service: General;  Laterality: Right;   CARDIAC CATHETERIZATION Left 07/31/2014   Procedure: CARDIAC CATHETERIZATION; Location: [facility]; Surgeon: Serafina Royals, MD   COLONOSCOPY WITH PROPOFOL N/A 02/11/2021   Procedure: COLONOSCOPY WITH PROPOFOL;  Surgeon: Lucilla Lame, MD;  Location: [facility];  Service: Endoscopy;  Laterality: N/A;  Requests Early   CORONARY ANGIOPLASTY WITH STENT PLACEMENT Left 10/16/2009   Procedure: CORONARY ANGIOPLASTY WITH STENT PLACEMENT; Location: [place]; Surgeon: Katrine Coho, MD   LASIK Bilateral    PARTIAL MASTECTOMY WITH NEEDLE LOCALIZATION Right 02/21/2018   Procedure: PARTIAL MASTECTOMY WITH NEEDLE LOCALIZATION;  Surgeon: Herbert Pun, MD;  Location: [facility];  Service: General;  Laterality: Right;   POLYPECTOMY N/A 02/11/2021   Procedure:  POLYPECTOMY;  Surgeon: Lucilla Lame, MD;  Location: [place];  Service: Endoscopy;  Laterality: N/A;   ROTATOR CUFF REPAIR Left    SHOULDER ARTHROSCOPY WITH ROTATOR CUFF REPAIR AND OPEN BICEPS TENODESIS Right 08/07/2019   Procedure: SHOULDER ARTHROSCOPY WITH DEBRIDEMENT, DECOMPRESSION, ROTATOR CUFF REPAIR AND BICEPS TENODESIS. - RNFA;  Surgeon: Corky Mull, MD;  Location: [facility];  Service: Orthopedics;  Laterality: Right;   TOE SURGERY Right    has pin and plate in it   VIDEO BRONCHOSCOPY WITH ENDOBRONCHIAL ULTRASOUND N/A 03/02/2022   Procedure: VIDEO  BRONCHOSCOPY WITH ENDOBRONCHIAL ULTRASOUND;  Surgeon: Tyler Pita, MD;  Location: [facility];  Service: Cardiopulmonary;  Laterality: N/A;    Family History  Problem Relation Age of Onset   Breast cancer Cousin 45       bilateral at 42 and 40; daughter of maternal aunt who was unaffected   Lung cancer Maternal Aunt        dx 66s; deceased 52s; smoker   Heart attack Father        deceased 60   Stomach cancer Maternal Grandmother     Social History Social History   Tobacco Use   Smoking status: Former    Packs/day: 1.00    Years: 30.00    Total pack years: 30.00    Types: Cigarettes    Quit date: 09/13/2015    Years since quitting: 6.4   Smokeless tobacco: Never  Vaping Use   Vaping Use: Never used  Substance Use Topics   Alcohol use: Yes    Comment: occasional   Drug use: No    Current Outpatient Medications  Medication Sig Dispense Refill   albuterol (VENTOLIN HFA) 108 (90 Base) MCG/ACT inhaler Inhale 1-2 puffs into the lungs every 6 (six) hours as needed for wheezing or shortness of breath. 1 g 1   alendronate (FOSAMAX) 70 MG tablet Take 1 tablet (70 mg total) by mouth once a week. Take with a full glass of water on an empty stomach. Stand or sit upright for 1 hour after taking 12 tablet 1   ALPRAZolam (XANAX) 0.25 MG tablet Take 1 tablet (0.25 mg total) by mouth 2 (two) times daily as needed for anxiety.  60 tablet 1   anastrozole (ARIMIDEX) 1 MG tablet TAKE (1) TABLET BY MOUTH EVERY DAY 90 tablet 1   aspirin-acetaminophen-caffeine (EXCEDRIN MIGRAINE) 315-176-16 MG tablet Take by mouth every 6 (six) hours as needed for headache.     Budeson-Glycopyrrol-Formoterol (BREZTRI AEROSPHERE) 160-9-4.8 MCG/ACT AERO Inhale 2 puffs into the lungs in the morning and at bedtime. 5.9 g 0   cetirizine (ZYRTEC) 10 MG tablet Take 10 mg by mouth daily as needed for allergies.      fluticasone (FLONASE) 50 MCG/ACT nasal spray Place 2 sprays into both nostrils daily as needed for allergies. 60 mL 1   folic acid (FOLVITE) 1 MG tablet Take 2 mg by mouth daily.     gabapentin (NEURONTIN) 100 MG capsule Take 1 capsule (100 mg total) by mouth 2 (two) times daily. 60 capsule 0   methotrexate (RHEUMATREX) 2.5 MG tablet Take 20 mg by mouth every Wednesday.      montelukast (SINGULAIR) 10 MG tablet Take 1 tablet (10 mg total) by mouth at bedtime. 90 tablet 1   omeprazole (PRILOSEC) 40 MG capsule Take 1 capsule (40 mg total) by mouth daily. 30 capsule 3   potassium chloride (KLOR-CON) 10 MEQ tablet Take 1 tablet (10 mEq total) by mouth daily. 90 tablet 1   sertraline (ZOLOFT) 100 MG tablet Take 1 tablet (100 mg total) by mouth daily. 30 tablet 3   simvastatin (ZOCOR) 40 MG tablet Take 1 tablet (40 mg total) by mouth at bedtime. 90 tablet 3   traMADol (ULTRAM) 50 MG tablet      triamcinolone cream (KENALOG) 0.1 % Apply 1 application. topically 2 (two) times daily. To affected area. 45 g 0   No current facility-administered medications for this visit.    Allergies  Allergen Reactions   Other     BANDAIDS-OF LEFT  ON FOR AN EXTENDED PERIOD OF TIME    Review of Systems  Constitutional:  Negative for activity change, fatigue and unexpected weight change.  HENT:  Negative for trouble swallowing and voice change.   Eyes:  Negative for visual disturbance.  Respiratory:  Positive for apnea (CPAP) and shortness of breath.    Cardiovascular:  Negative for chest pain.  Gastrointestinal:  Positive for abdominal pain.       Frequent heartburn (see neck pain)  Genitourinary:  Negative for difficulty urinating and dysuria.  Musculoskeletal:  Positive for arthralgias, joint swelling and neck pain (Burning pain in neck since starting Breztri).  Neurological:  Negative for seizures, syncope and weakness.  Hematological:  Negative for adenopathy. Bruises/bleeds easily.  Psychiatric/Behavioral:  Positive for dysphoric mood. The patient is nervous/anxious.     BP 121/80 (BP Location: Left Arm, Patient Position: Sitting, Cuff Size: Normal)   Pulse 77   Resp 20   Ht 5' (1.524 m)   Wt 140 lb 8 oz (63.7 kg)   SpO2 94% Comment: RA  BMI 27.44 kg/m  Physical Exam Vitals reviewed.  Constitutional:      General: She is not in acute distress.    Appearance: Normal appearance.  HENT:     Head: Normocephalic and atraumatic.  Eyes:     General: No scleral icterus.    Extraocular Movements: Extraocular movements intact.  Cardiovascular:     Rate and Rhythm: Normal rate and regular rhythm.     Pulses: Normal pulses.     Heart sounds: Normal heart sounds. No murmur heard. Pulmonary:     Effort: Pulmonary effort is normal. No respiratory distress.     Breath sounds: Normal breath sounds. No wheezing.  Abdominal:     General: There is no distension.     Palpations: Abdomen is soft.     Tenderness: There is no abdominal tenderness.  Musculoskeletal:     Cervical back: Neck supple.  Lymphadenopathy:     Cervical: No cervical adenopathy.  Skin:    General: Skin is warm and dry.  Neurological:     General: No focal deficit present.     Mental Status: She is alert and oriented to person, place, and time.      Diagnostic Tests: NUCLEAR MEDICINE PET SKULL BASE TO THIGH   TECHNIQUE: 7.0 mCi F-18 FDG was injected intravenously. Full-ring PET imaging was performed from the skull base to thigh after the radiotracer.  CT data was obtained and used for attenuation correction and anatomic localization.   Fasting blood glucose: 102 mg/dl   COMPARISON:  CT chest abdomen pelvis dated 11/29/2021   FINDINGS: Mediastinal blood pool activity: SUV max 3.0   Liver activity: SUV max NA   NECK: No hypermetabolic cervical lymphadenopathy.   Incidental CT findings: none   CHEST: 2.2 x 1.9 cm spiculated nodule in the anterior right upper lobe (series 7/image 27), abutting the right anterior chest wall, max SUV 16.4. This appearance favors primary bronchogenic neoplasm over metastasis.   Bandlike patchy opacity in the anterior left upper lobe (series 7/image 37), max SUV 3.7, new from recent prior and favored to be infectious/inflammatory.   Small mediastinal nodes, including a dominant 12 mm short axis low right paratracheal node (series 4/image 60), max SUV 4.5.   Postprocedural changes in the lateral right breast with associated 6.4 x 4.5 cm seroma (series 4/image 72), non FDG avid.   Incidental CT findings: Atherosclerotic calcifications of the aortic arch. Three vessel coronary atherosclerosis.  ABDOMEN/PELVIS: No abnormal hypermetabolism in the liver, spleen, pancreas, or adrenal glands.   No hypermetabolic abdominopelvic lymphadenopathy.   Incidental CT findings: 2.3 cm right adrenal adenoma. Atherosclerotic calcifications of the abdominal aorta and branch vessels. Status post hysterectomy.   SKELETON: Destructive changes involving the right lateral 7th rib (series 4/image 82), max SUV 4.7, worrisome for pathologic fracture.   Incidental CT findings: Cervical spine fixation hardware. Lumbar spine fixation hardware. Mild degenerative changes of the visualized thoracolumbar spine.   IMPRESSION: 2.2 cm spiculated nodule in the anterior right upper lobe, favoring primary bronchogenic neoplasm over metastasis.   Dominant 12 mm short axis low right paratracheal node, indeterminate.  Attention on follow-up is suggested.   Destructive changes involving the right lateral 7th rib, worrisome for pathologic fracture.   Bandlike opacity in the anterior left upper lobe, favored to be infectious/inflammatory.   Postoperative seroma in the right breast, non FDG avid.   Benign right adrenal adenoma.     Electronically Signed   By: Julian Hy M.D.   On: 01/07/2022 00:52  I personally reviewed the CT images.  There is a markedly hypermetabolic 2.2 cm spiculated nodule in the anterior right upper lobe adjacent to the chest wall but no definite invasion.  Mild uptake in a right paratracheal node.  Fracture of left seventh rib.  Impression: Michelle Moore is a 66 year old woman with a history of tobacco abuse, COPD, CAD, MI, thoracic aortic atherosclerosis, hypertension, hyperlipidemia, reflux, anxiety, depression, rheumatoid arthritis, bladder mass, thyroid nodule, and DCIS of the breast.   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  Possibly could be IIB (T3, N0) if chest wall invasion, scans do not rule this out but do not have any definitive evidence of an either.  In any event she has resectable disease.  I discussed potential surgical options with her.  She is concerned about robotic surgery since her brother had neuropathic pain after robotic lung resection.  I explained to her that neuropathic pain is a risk with robotic surgery or any other approach the lung resection and in fact would be higher likelihood with an open procedure.  I informed her of the need for general anesthesia, the incisions to be used, the use of surgical robot, use of drains to postoperatively, the expected hospital stay, and the overall recovery.  I informed her of the indications, risks, benefits, and alternatives.  She understands the risks include, but are not limited to death, MI, DVT, PE, bleeding, possible need for transfusion, infection, prolonged air leak, cardiac  arrhythmias, as well as possibility of other unforeseeable complications.  She understands that there is no way to guarantee a pain-free lung resection.  We will start her on low-dose gabapentin preoperatively at 100 mg twice daily and can adjust the dose if necessary if she has intercostal neuralgia.  Rib fracture-she is not aware of any trauma to the area.  It has been about 2 months since her PET.  We will get a repeat CT to look at that area.  It should have declared at this point whether or not it is a pathologic fracture.  If concerning we will do a biopsy prior to resection.  CAD-the burning pain in her neck is concerning for possible anginal equivalent.  She only started having this after she started Biron.  Difficult to know what to make of this discomfort as she is not able to associate it with any thing other than the new medication.  Not  exertional, does not follow meals, not associated with lying down.  Followed by Dr. Nehemiah Massed.  Scheduled for stress test tomorrow.  Plan: Stress test with Dr. Nehemiah Massed tomorrow Tentatively plan for robotic right VATS for right upper lobectomy, possible chest wall resection, possible conversion to thoracotomy on Thursday, 03/17/2022 Since it has been present over 2 months since her PET/CT will do another CT of the chest to reevaluate the rib fracture and also make sure there are no new findings of concern. Gabapentin 100 mg p.o. twice daily to start 1 week preoperatively  Melrose Nakayama, MD [facility] [phone]

## 2022-03-08 DIAGNOSIS — Z9889 Other specified postprocedural states: Secondary | ICD-10-CM | POA: Diagnosis not present

## 2022-03-08 DIAGNOSIS — I2510 Atherosclerotic heart disease of native coronary artery without angina pectoris: Secondary | ICD-10-CM | POA: Diagnosis not present

## 2022-03-08 DIAGNOSIS — R0602 Shortness of breath: Secondary | ICD-10-CM | POA: Diagnosis not present

## 2022-03-08 DIAGNOSIS — Z01818 Encounter for other preprocedural examination: Secondary | ICD-10-CM | POA: Diagnosis not present

## 2022-03-09 ENCOUNTER — Telehealth: Payer: Self-pay

## 2022-03-09 NOTE — Telephone Encounter (Signed)
Patient left message regarding surgery scheduled. She is having lung surgery 03/17/22 and wants to discuss surgery scheduled with [place]

## 2022-03-10 ENCOUNTER — Ambulatory Visit (HOSPITAL_COMMUNITY)
Admission: RE | Admit: 2022-03-10 | Discharge: 2022-03-10 | Disposition: A | Discharge status: Home / Self Care | Payer: Medicare HMO | Source: Ambulatory Visit | Attending: Thoracic Surgery (Cardiothoracic Vascular Surgery) | Admitting: Thoracic Surgery (Cardiothoracic Vascular Surgery)

## 2022-03-10 DIAGNOSIS — E782 Mixed hyperlipidemia: Secondary | ICD-10-CM | POA: Diagnosis not present

## 2022-03-10 DIAGNOSIS — Z9989 Dependence on other enabling machines and devices: Secondary | ICD-10-CM | POA: Diagnosis not present

## 2022-03-10 DIAGNOSIS — G4733 Obstructive sleep apnea (adult) (pediatric): Secondary | ICD-10-CM | POA: Diagnosis not present

## 2022-03-10 DIAGNOSIS — C3491 Malignant neoplasm of unspecified part of right bronchus or lung: Secondary | ICD-10-CM | POA: Insufficient documentation

## 2022-03-10 DIAGNOSIS — R911 Solitary pulmonary nodule: Secondary | ICD-10-CM | POA: Diagnosis not present

## 2022-03-10 DIAGNOSIS — C3490 Malignant neoplasm of unspecified part of unspecified bronchus or lung: Secondary | ICD-10-CM | POA: Diagnosis not present

## 2022-03-10 DIAGNOSIS — I2510 Atherosclerotic heart disease of native coronary artery without angina pectoris: Secondary | ICD-10-CM | POA: Diagnosis not present

## 2022-03-11 ENCOUNTER — Ambulatory Visit
Admission: RE | Admit: 2022-03-11 | Discharge: 2022-03-11 | Disposition: A | Discharge status: Home / Self Care | Payer: Medicare HMO | Source: Ambulatory Visit | Attending: Oncology | Admitting: Oncology

## 2022-03-11 ENCOUNTER — Encounter: Payer: Self-pay | Admitting: Urology

## 2022-03-11 DIAGNOSIS — Z1231 Encounter for screening mammogram for malignant neoplasm of breast: Secondary | ICD-10-CM | POA: Insufficient documentation

## 2022-03-12 DIAGNOSIS — C3411 Malignant neoplasm of upper lobe, right bronchus or lung: Secondary | ICD-10-CM

## 2022-03-12 HISTORY — DX: Malignant neoplasm of upper lobe, right bronchus or lung: C34.11

## 2022-03-14 ENCOUNTER — Ambulatory Visit (HOSPITAL_COMMUNITY)
Admission: RE | Admit: 2022-03-14 | Discharge: 2022-03-14 | Disposition: A | Discharge status: Home / Self Care | Payer: Medicare HMO | Source: Ambulatory Visit | Attending: Thoracic Surgery (Cardiothoracic Vascular Surgery) | Admitting: Thoracic Surgery (Cardiothoracic Vascular Surgery)

## 2022-03-14 ENCOUNTER — Other Ambulatory Visit: Payer: Self-pay | Admitting: Physician Assistant

## 2022-03-14 ENCOUNTER — Encounter (HOSPITAL_COMMUNITY): Payer: Self-pay

## 2022-03-14 ENCOUNTER — Other Ambulatory Visit: Payer: Self-pay

## 2022-03-14 ENCOUNTER — Encounter (HOSPITAL_COMMUNITY)
Admission: RE | Admit: 2022-03-14 | Discharge: 2022-03-14 | Disposition: A | Discharge status: Home / Self Care | Payer: Medicare HMO | Source: Ambulatory Visit | Attending: Thoracic Surgery (Cardiothoracic Vascular Surgery) | Admitting: Thoracic Surgery (Cardiothoracic Vascular Surgery)

## 2022-03-14 VITALS — BP 144/68 | HR 76 | Temp 98.0°F | Resp 18 | Ht 60.0 in | Wt 141.9 lb

## 2022-03-14 DIAGNOSIS — Z20822 Contact with and (suspected) exposure to covid-19: Secondary | ICD-10-CM | POA: Insufficient documentation

## 2022-03-14 DIAGNOSIS — M4326 Fusion of spine, lumbar region: Secondary | ICD-10-CM | POA: Diagnosis not present

## 2022-03-14 DIAGNOSIS — J449 Chronic obstructive pulmonary disease, unspecified: Secondary | ICD-10-CM | POA: Diagnosis present

## 2022-03-14 DIAGNOSIS — C3491 Malignant neoplasm of unspecified part of right bronchus or lung: Secondary | ICD-10-CM | POA: Insufficient documentation

## 2022-03-14 DIAGNOSIS — J90 Pleural effusion, not elsewhere classified: Secondary | ICD-10-CM | POA: Diagnosis not present

## 2022-03-14 DIAGNOSIS — Z01812 Encounter for preprocedural laboratory examination: Secondary | ICD-10-CM | POA: Insufficient documentation

## 2022-03-14 DIAGNOSIS — Z01818 Encounter for other preprocedural examination: Secondary | ICD-10-CM

## 2022-03-14 DIAGNOSIS — K219 Gastro-esophageal reflux disease without esophagitis: Secondary | ICD-10-CM | POA: Diagnosis present

## 2022-03-14 DIAGNOSIS — I252 Old myocardial infarction: Secondary | ICD-10-CM | POA: Diagnosis not present

## 2022-03-14 DIAGNOSIS — R918 Other nonspecific abnormal finding of lung field: Secondary | ICD-10-CM | POA: Diagnosis not present

## 2022-03-14 DIAGNOSIS — J9382 Other air leak: Secondary | ICD-10-CM | POA: Diagnosis not present

## 2022-03-14 DIAGNOSIS — Z79811 Long term (current) use of aromatase inhibitors: Secondary | ICD-10-CM | POA: Diagnosis not present

## 2022-03-14 DIAGNOSIS — Z8249 Family history of ischemic heart disease and other diseases of the circulatory system: Secondary | ICD-10-CM | POA: Diagnosis not present

## 2022-03-14 DIAGNOSIS — Z800 Family history of malignant neoplasm of digestive organs: Secondary | ICD-10-CM | POA: Diagnosis not present

## 2022-03-14 DIAGNOSIS — D3501 Benign neoplasm of right adrenal gland: Secondary | ICD-10-CM | POA: Diagnosis present

## 2022-03-14 DIAGNOSIS — J439 Emphysema, unspecified: Secondary | ICD-10-CM | POA: Diagnosis not present

## 2022-03-14 DIAGNOSIS — M25411 Effusion, right shoulder: Secondary | ICD-10-CM | POA: Diagnosis not present

## 2022-03-14 DIAGNOSIS — Z90710 Acquired absence of both cervix and uterus: Secondary | ICD-10-CM | POA: Diagnosis not present

## 2022-03-14 DIAGNOSIS — Z4682 Encounter for fitting and adjustment of non-vascular catheter: Secondary | ICD-10-CM | POA: Diagnosis not present

## 2022-03-14 DIAGNOSIS — I700 Atherosclerosis of aorta: Secondary | ICD-10-CM | POA: Diagnosis present

## 2022-03-14 DIAGNOSIS — Z923 Personal history of irradiation: Secondary | ICD-10-CM | POA: Diagnosis not present

## 2022-03-14 DIAGNOSIS — Z8616 Personal history of COVID-19: Secondary | ICD-10-CM | POA: Diagnosis not present

## 2022-03-14 DIAGNOSIS — E876 Hypokalemia: Secondary | ICD-10-CM

## 2022-03-14 DIAGNOSIS — J9811 Atelectasis: Secondary | ICD-10-CM | POA: Diagnosis not present

## 2022-03-14 DIAGNOSIS — Z801 Family history of malignant neoplasm of trachea, bronchus and lung: Secondary | ICD-10-CM | POA: Diagnosis not present

## 2022-03-14 DIAGNOSIS — I10 Essential (primary) hypertension: Secondary | ICD-10-CM | POA: Diagnosis present

## 2022-03-14 DIAGNOSIS — F32A Depression, unspecified: Secondary | ICD-10-CM | POA: Diagnosis present

## 2022-03-14 DIAGNOSIS — Z86000 Personal history of in-situ neoplasm of breast: Secondary | ICD-10-CM | POA: Diagnosis not present

## 2022-03-14 DIAGNOSIS — C3411 Malignant neoplasm of upper lobe, right bronchus or lung: Secondary | ICD-10-CM | POA: Diagnosis present

## 2022-03-14 DIAGNOSIS — X58XXXA Exposure to other specified factors, initial encounter: Secondary | ICD-10-CM | POA: Diagnosis present

## 2022-03-14 DIAGNOSIS — J939 Pneumothorax, unspecified: Secondary | ICD-10-CM | POA: Diagnosis not present

## 2022-03-14 DIAGNOSIS — C771 Secondary and unspecified malignant neoplasm of intrathoracic lymph nodes: Secondary | ICD-10-CM | POA: Diagnosis not present

## 2022-03-14 DIAGNOSIS — C782 Secondary malignant neoplasm of pleura: Secondary | ICD-10-CM | POA: Diagnosis not present

## 2022-03-14 DIAGNOSIS — Z87891 Personal history of nicotine dependence: Secondary | ICD-10-CM | POA: Diagnosis not present

## 2022-03-14 DIAGNOSIS — S2231XA Fracture of one rib, right side, initial encounter for closed fracture: Secondary | ICD-10-CM | POA: Diagnosis present

## 2022-03-14 DIAGNOSIS — Z803 Family history of malignant neoplasm of breast: Secondary | ICD-10-CM | POA: Diagnosis not present

## 2022-03-14 DIAGNOSIS — I2510 Atherosclerotic heart disease of native coronary artery without angina pectoris: Secondary | ICD-10-CM | POA: Diagnosis present

## 2022-03-14 DIAGNOSIS — E782 Mixed hyperlipidemia: Secondary | ICD-10-CM | POA: Diagnosis present

## 2022-03-14 DIAGNOSIS — M069 Rheumatoid arthritis, unspecified: Secondary | ICD-10-CM | POA: Diagnosis present

## 2022-03-14 LAB — SURGICAL PCR SCREEN
MRSA, PCR: NEGATIVE
Staphylococcus aureus: NEGATIVE

## 2022-03-14 LAB — COMPREHENSIVE METABOLIC PANEL
ALT: 16 U/L (ref 0–44)
AST: 17 U/L (ref 15–41)
Albumin: 3.5 g/dL (ref 3.5–5.0)
Alkaline Phosphatase: 85 U/L (ref 38–126)
Anion gap: 11 (ref 5–15)
BUN: 10 mg/dL (ref 8–23)
CO2: 24 mmol/L (ref 22–32)
Calcium: 9.1 mg/dL (ref 8.9–10.3)
Chloride: 106 mmol/L (ref 98–111)
Creatinine, Ser: 0.61 mg/dL (ref 0.44–1.00)
GFR, Estimated: >60 mL/min (ref >60)
Glucose, Bld: 120 mg/dL — ABNORMAL HIGH (ref 70–99)
Potassium: 3.6 mmol/L (ref 3.5–5.1)
Sodium: 141 mmol/L (ref 135–145)
Total Bilirubin: 0.2 mg/dL — ABNORMAL LOW (ref 0.3–1.2)
Total Protein: 6.5 g/dL (ref 6.5–8.1)

## 2022-03-14 LAB — CBC
HCT: 45.7 % (ref 36.0–46.0)
Hemoglobin: 14.3 g/dL (ref 12.0–15.0)
MCH: 30 pg (ref 26.0–34.0)
MCHC: 31.3 g/dL (ref 30.0–36.0)
MCV: 95.8 fL (ref 80.0–100.0)
Platelets: 217 10*3/uL (ref 150–400)
RBC: 4.77 MIL/uL (ref 3.87–5.11)
RDW: 14 % (ref 11.5–15.5)
WBC: 9.2 10*3/uL (ref 4.0–10.5)
nRBC: 0 % (ref 0.0–0.2)

## 2022-03-14 LAB — URINALYSIS, ROUTINE W REFLEX MICROSCOPIC
Bilirubin Urine: NEGATIVE
Glucose, UA: NEGATIVE mg/dL
Ketones, ur: NEGATIVE mg/dL
Nitrite: NEGATIVE
Protein, ur: NEGATIVE mg/dL
Specific Gravity, Urine: 1.018 (ref 1.005–1.030)
pH: 5 (ref 5.0–8.0)

## 2022-03-14 LAB — PROTIME-INR
INR: 1.1 (ref 0.8–1.2)
Prothrombin Time: 14.4 seconds (ref 11.4–15.2)

## 2022-03-14 LAB — SARS CORONAVIRUS 2 (TAT 6-24 HRS): SARS Coronavirus 2: NEGATIVE

## 2022-03-14 LAB — APTT: aPTT: 28 seconds (ref 24–36)

## 2022-03-14 NOTE — Progress Notes (Signed)
PCP - [facility] Cardiologist - Nehemiah Massed ([place])  Chest x-ray - 03/14/22 EKG - 03/03/22 Stress Test - 03/08/22  SA - yes, wears CPAP  COVID TEST- 03/14/22   Anesthesia review: yes, heart history  Patient denies shortness of breath, fever, cough and chest pain at PAT appointment   All instructions explained to the patient, with a verbal understanding of the material. Patient agrees to go over the instructions while at home for a better understanding. Patient also instructed to self quarantine after being tested for COVID-19. The opportunity to ask questions was provided.

## 2022-03-14 NOTE — Telephone Encounter (Signed)
Spoke with pt. Pt. Is concerned about having surgery on 04/04/2022. She states that she is having lung surgery on 07/06 and is worried about having anesthesia so close together. She would like to know your opinion on this.

## 2022-03-14 NOTE — Progress Notes (Signed)
Surgical Instructions     Your procedure is scheduled on Thursday, July 6th.  Report to [facility] Main Entrance "A" at 10:00 A.M., then check in with the Admitting office.  Call this number if you have problems the morning of surgery:  [phone]   If you have any questions prior to your surgery date call [phone]: Open Monday-Friday 8am-4pm    Remember:  Do not eat or drink after midnight the night before your surgery    Take these medicines the morning of surgery with A SIP OF WATER:   Breztri inhaler  Gabapentin (Neurontin)  Omeprazole (Prilosec)  Sertraline (Zoloft)   If needed:  Albuterol inhaler - bring with you on day of surgery  Alprazolam (Xanax)  Flonase nasal spray  Singulair  As of today, STOP taking any Aspirin (unless otherwise instructed by your surgeon) Aleve, Naproxen, Ibuprofen, Motrin, Advil, Goody's, BC's, all herbal medications, fish oil, and all vitamins.           DAY OF SURGERY: Do not wear jewelry or makeup Do not wear lotions, powders, perfumes, or deodorant. Do not shave 48 hours prior to surgery.   Do not bring valuables to the hospital. Do not wear nail polish, gel polish, artificial nails, or any other type of covering on natural nails (fingers and toes) If you have artificial nails or gel coating that need to be removed by a nail salon, please have this removed prior to surgery. Artificial nails or gel coating may interfere with anesthesia's ability to adequately monitor your vital signs.  [facility] is not responsible for any belongings or valuables. .   Do NOT Smoke (Tobacco/Vaping)  24 hours prior to your procedure  If you use a CPAP at night, you may bring your mask for your overnight stay.   Contacts, glasses, hearing aids, dentures or partials may not be worn into surgery, please bring cases for these belongings   For patients admitted to the hospital, discharge time will be determined by your treatment team.   Patients  discharged the day of surgery will not be allowed to drive home, and someone needs to stay with them for 24 hours.   SURGICAL WAITING ROOM VISITATION Patients having surgery or a procedure in a hospital may have two support people. Children under the age of 34 must have an adult with them who is not the patient. They may stay in the waiting area during the procedure and may switch out with other visitors. If the patient needs to stay at the hospital during part of their recovery, the visitor guidelines for inpatient rooms apply.  Please refer to the [facility] website for the visitor guidelines for Inpatients (after your surgery is over and you are in a regular room).   Special instructions:    Oral Hygiene is also important to reduce your risk of infection.  Remember - BRUSH YOUR TEETH THE MORNING OF SURGERY WITH YOUR REGULAR TOOTHPASTE   [facility]- Preparing For Surgery  Before surgery, you can play an important role. Because skin is not sterile, your skin needs to be as free of germs as possible. You can reduce the number of germs on your skin by washing with CHG (chlorahexidine gluconate) Soap before surgery.  CHG is an antiseptic cleaner which kills germs and bonds with the skin to continue killing germs even after washing.     Please do not use if you have an allergy to CHG or antibacterial soaps. If your skin becomes reddened/irritated stop  using the CHG.  Do not shave (including legs and underarms) for at least 48 hours prior to first CHG shower. It is OK to shave your face.  Please follow these instructions carefully.     Shower the NIGHT BEFORE SURGERY and the MORNING OF SURGERY with CHG Soap.   If you chose to wash your hair, wash your hair first as usual with your normal shampoo. After you shampoo, rinse your hair and body thoroughly to remove the shampoo.  Then [employer] and genitals (private parts) with your normal soap and rinse thoroughly to remove soap.  After that Use  CHG Soap as you would any other liquid soap. You can apply CHG directly to the skin and wash gently with a scrungie or a clean washcloth.   Apply the CHG Soap to your body ONLY FROM THE NECK DOWN.  Do not use on open wounds or open sores. Avoid contact with your eyes, ears, mouth and genitals (private parts). Wash Face and genitals (private parts)  with your normal soap.   Wash thoroughly, paying special attention to the area where your surgery will be performed.  Thoroughly rinse your body with warm water from the neck down.  DO NOT shower/wash with your normal soap after using and rinsing off the CHG Soap.  Pat yourself dry with a CLEAN TOWEL.  Wear CLEAN PAJAMAS to bed the night before surgery  Place CLEAN SHEETS on your bed the night before your surgery  DO NOT SLEEP WITH PETS.   Day of Surgery:  Take a shower with CHG soap. Wear Clean/Comfortable clothing the morning of surgery Do not apply any deodorants/lotions.   Remember to brush your teeth WITH YOUR REGULAR TOOTHPASTE.   If you received a COVID test during your pre-op visit, it is requested that you wear a mask when out in public, stay away from anyone that may not be feeling well, and notify your surgeon if you develop symptoms. If you have been in contact with anyone that has tested positive in the last 10 days, please notify your surgeon.    Please read over the following fact sheets that you were given.

## 2022-03-16 ENCOUNTER — Inpatient Hospital Stay: Payer: Medicare HMO

## 2022-03-16 ENCOUNTER — Encounter: Payer: Medicare HMO | Admitting: Thoracic Surgery (Cardiothoracic Vascular Surgery)

## 2022-03-16 NOTE — Anesthesia Preprocedure Evaluation (Signed)
Anesthesia Evaluation  Patient identified by MRN, date of birth, ID band Patient awake    Reviewed: Allergy & Precautions, NPO status , Patient's Chart, lab work & pertinent test results  Airway Mallampati: III  TM Distance: >3 FB Neck ROM: Full    Dental no notable dental hx. (+) Teeth Intact, Dental Advisory Given   Pulmonary sleep apnea and Continuous Positive Airway Pressure Ventilation , COPD, former smoker (45 pack year, quit 2017),    Pulmonary exam normal breath sounds clear to auscultation       Cardiovascular hypertension, + CAD, + Past MI and + Cardiac Stents (DES to dRCA 2011)  Normal cardiovascular exam Rhythm:Regular Rate:Normal  Stress 2023 INTERPRETATION  Normal Stress Echocardiogram  NORMAL RIGHT VENTRICULAR SYSTOLIC FUNCTION  TRIVIAL REGURGITATION NOTED (See above)  NO VALVULAR STENOSIS NOTED     Neuro/Psych  Headaches, Seizures -, Well Controlled,  PSYCHIATRIC DISORDERS Anxiety Depression    GI/Hepatic Neg liver ROS, GERD  ,  Endo/Other  negative endocrine ROS  Renal/GU negative Renal ROS  negative genitourinary   Musculoskeletal  (+) Arthritis  (s/p ACDF), Rheumatoid disorders,    Abdominal   Peds  Hematology negative hematology ROS (+)   Anesthesia Other Findings   Reproductive/Obstetrics                           Anesthesia Physical Anesthesia Plan  ASA: 3  Anesthesia Plan: General   Post-op Pain Management: Tylenol PO (pre-op)*   Induction: Intravenous  PONV Risk Score and Plan: 3 and Midazolam, Dexamethasone and Ondansetron  Airway Management Planned: Oral ETT, Double Lumen EBT and Video Laryngoscope Planned  Additional Equipment: Arterial line  Intra-op Plan:   Post-operative Plan: Extubation in OR  Informed Consent: I have reviewed the patients History and Physical, chart, labs and discussed the procedure including the risks, benefits and  alternatives for the proposed anesthesia with the patient or authorized representative who has indicated his/her understanding and acceptance.     Dental advisory given  Plan Discussed with: CRNA  Anesthesia Plan Comments: ( )       Anesthesia Quick Evaluation

## 2022-03-16 NOTE — Progress Notes (Signed)
Anesthesia Chart Review:  Follows with cardiology for history of CAD s/p inferior wall STEMI treated with DES to [place] 2011, HTN, HLD, OSA on CPAP.  Followed by Dr. Nehemiah Massed at [facility].  Recent stress echo 03/08/2022 was normal.  Seen by Dr. Nehemiah Massed 03/10/2022 and cleared for surgery.  Per note, "Proceed to surgery and/or invasive procedure without restriction to pre or post operative and/or procedural care. The patient is at lowest risk possible for cardiovascular complications with surgical intervention and/or invasive procedure. Currently has no evidence active and/or significant angina and/or congestive heart failure."  Follows with rheumatology for his rheumatoid arthritis.  Maintained on methotrexate.  History of DCIS, right breast: Patient underwent lumpectomy followed by adjuvant XRT completing in September 2019.  Because there was no invasive component on her pathology, she did not require adjuvant chemotherapy.  She continues to be maintained on anastrozole.  Former smoker (45-pack-year, quit 2017) with history of COPD.  In February she presented with a 3-week cough.  A chest x-ray showed right upper lobe opacity.  That led to a CT of the chest in late March which showed a 1.9 x 1.4 x 2.5 cm spiculated nodule in anterior upper lobe.  A PET/CT was then done about a month later which showed the nodule measured 2.2 x 1.9 cm and was markedly hypermetabolic with an SUV of 62.9.  There was mild uptake in a paratracheal node which was indeterminate.  There was some uptake in the right lateral seventh rib in the area of a fracture. 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  She then had an endobronchial ultrasound to evaluate the right paratracheal node.  Aspirations showed lymphoid cells but no evidence of metastasis.  History of multilevel ACDF.  Reported difficult airway due to anterior larynx.  Preop labs reviewed, unremarkable.  EKG 02/28/2022: NSR  with sinus arrhythmia.  Rate 67.  CHEST - 2 VIEW 03/14/22:  COMPARISON:  02/08/2022.   FINDINGS: The heart size and mediastinal contours are within normal limits. There is atherosclerotic calcification of the aorta. A nodular opacity is present in the right upper lobe measuring 2.3 cm, not significantly changed from the prior exam. No effusion or pneumothorax. Degenerative changes are present in the thoracic spine. Cervical spinal fusion hardware is present.   IMPRESSION: 1. No active cardiopulmonary disease. 2. Stable right upper lobe nodular opacity.   CT chest 03/10/2022: IMPRESSION: Increased size of spiculated nodule in anterior right upper lobe, consistent with primary bronchogenic carcinoma.   No evidence of metastatic disease.   Stable benign right adrenal adenoma.   Aortic Atherosclerosis (ICD10-I70.0).  Stress echo 03/08/2022 (Care Everywhere): INTERPRETATION  Normal Stress Echocardiogram  NORMAL RIGHT VENTRICULAR SYSTOLIC FUNCTION  TRIVIAL REGURGITATION NOTED (See above)  NO VALVULAR STENOSIS NOTED     Wynonia Musty [facility]/Anesthesiology Phone [phone] 03/16/2022 9:59 AM

## 2022-03-16 NOTE — Telephone Encounter (Signed)
Spoke with Mrs. Vegh, we discussed pushing back her surgery due to her upcoming Thorascopy which is scheduled for tomorrow. We have moved her case from 04/04/2022 to 05/02/2022. I have updated the OR Snapboard. Patient to call with any questions or concerns.

## 2022-03-16 NOTE — Telephone Encounter (Signed)
Agree, recommend waiting 4 to 6 weeks postop and also get clearance from her CT surgeon  Hollice Espy, MD

## 2022-03-17 ENCOUNTER — Inpatient Hospital Stay (HOSPITAL_COMMUNITY): Payer: Medicare HMO | Admitting: Physician Assistant

## 2022-03-17 ENCOUNTER — Other Ambulatory Visit: Payer: Self-pay

## 2022-03-17 ENCOUNTER — Inpatient Hospital Stay (HOSPITAL_COMMUNITY)
Admission: RE | Admit: 2022-03-17 | Discharge: 2022-03-22 | DRG: 164 | Disposition: A | Discharge status: Home Health | Payer: Medicare HMO | Attending: Thoracic Surgery (Cardiothoracic Vascular Surgery) | Admitting: Thoracic Surgery (Cardiothoracic Vascular Surgery)

## 2022-03-17 ENCOUNTER — Inpatient Hospital Stay (HOSPITAL_COMMUNITY): Payer: Medicare HMO

## 2022-03-17 ENCOUNTER — Encounter (HOSPITAL_COMMUNITY): Payer: Self-pay | Admitting: Thoracic Surgery (Cardiothoracic Vascular Surgery)

## 2022-03-17 ENCOUNTER — Encounter (HOSPITAL_COMMUNITY)
Admission: RE | Disposition: A | Discharge status: Home / Self Care | Payer: Self-pay | Source: Home / Self Care | Attending: Thoracic Surgery (Cardiothoracic Vascular Surgery)

## 2022-03-17 ENCOUNTER — Encounter: Payer: Self-pay | Admitting: Urology

## 2022-03-17 DIAGNOSIS — D3501 Benign neoplasm of right adrenal gland: Secondary | ICD-10-CM | POA: Diagnosis present

## 2022-03-17 DIAGNOSIS — Z803 Family history of malignant neoplasm of breast: Secondary | ICD-10-CM

## 2022-03-17 DIAGNOSIS — Z801 Family history of malignant neoplasm of trachea, bronchus and lung: Secondary | ICD-10-CM | POA: Diagnosis not present

## 2022-03-17 DIAGNOSIS — C3491 Malignant neoplasm of unspecified part of right bronchus or lung: Secondary | ICD-10-CM

## 2022-03-17 DIAGNOSIS — I252 Old myocardial infarction: Secondary | ICD-10-CM

## 2022-03-17 DIAGNOSIS — Z4682 Encounter for fitting and adjustment of non-vascular catheter: Secondary | ICD-10-CM | POA: Diagnosis not present

## 2022-03-17 DIAGNOSIS — M069 Rheumatoid arthritis, unspecified: Secondary | ICD-10-CM | POA: Diagnosis present

## 2022-03-17 DIAGNOSIS — J449 Chronic obstructive pulmonary disease, unspecified: Secondary | ICD-10-CM | POA: Diagnosis present

## 2022-03-17 DIAGNOSIS — K219 Gastro-esophageal reflux disease without esophagitis: Secondary | ICD-10-CM | POA: Diagnosis present

## 2022-03-17 DIAGNOSIS — X58XXXA Exposure to other specified factors, initial encounter: Secondary | ICD-10-CM | POA: Diagnosis present

## 2022-03-17 DIAGNOSIS — Z79811 Long term (current) use of aromatase inhibitors: Secondary | ICD-10-CM

## 2022-03-17 DIAGNOSIS — E782 Mixed hyperlipidemia: Secondary | ICD-10-CM | POA: Diagnosis present

## 2022-03-17 DIAGNOSIS — Z87891 Personal history of nicotine dependence: Secondary | ICD-10-CM

## 2022-03-17 DIAGNOSIS — C3411 Malignant neoplasm of upper lobe, right bronchus or lung: Secondary | ICD-10-CM

## 2022-03-17 DIAGNOSIS — I10 Essential (primary) hypertension: Secondary | ICD-10-CM | POA: Diagnosis present

## 2022-03-17 DIAGNOSIS — F32A Depression, unspecified: Secondary | ICD-10-CM | POA: Diagnosis present

## 2022-03-17 DIAGNOSIS — J9811 Atelectasis: Secondary | ICD-10-CM | POA: Diagnosis not present

## 2022-03-17 DIAGNOSIS — M25511 Pain in right shoulder: Secondary | ICD-10-CM | POA: Diagnosis present

## 2022-03-17 DIAGNOSIS — Z90710 Acquired absence of both cervix and uterus: Secondary | ICD-10-CM | POA: Diagnosis not present

## 2022-03-17 DIAGNOSIS — I2510 Atherosclerotic heart disease of native coronary artery without angina pectoris: Secondary | ICD-10-CM

## 2022-03-17 DIAGNOSIS — S2231XA Fracture of one rib, right side, initial encounter for closed fracture: Secondary | ICD-10-CM | POA: Diagnosis present

## 2022-03-17 DIAGNOSIS — Z8249 Family history of ischemic heart disease and other diseases of the circulatory system: Secondary | ICD-10-CM

## 2022-03-17 DIAGNOSIS — I700 Atherosclerosis of aorta: Secondary | ICD-10-CM | POA: Diagnosis present

## 2022-03-17 DIAGNOSIS — J90 Pleural effusion, not elsewhere classified: Secondary | ICD-10-CM | POA: Diagnosis not present

## 2022-03-17 DIAGNOSIS — J9382 Other air leak: Secondary | ICD-10-CM | POA: Diagnosis not present

## 2022-03-17 DIAGNOSIS — Z800 Family history of malignant neoplasm of digestive organs: Secondary | ICD-10-CM | POA: Diagnosis not present

## 2022-03-17 DIAGNOSIS — M4722 Other spondylosis with radiculopathy, cervical region: Secondary | ICD-10-CM | POA: Diagnosis present

## 2022-03-17 DIAGNOSIS — Z7983 Long term (current) use of bisphosphonates: Secondary | ICD-10-CM

## 2022-03-17 DIAGNOSIS — Z923 Personal history of irradiation: Secondary | ICD-10-CM | POA: Diagnosis not present

## 2022-03-17 DIAGNOSIS — Z7951 Long term (current) use of inhaled steroids: Secondary | ICD-10-CM

## 2022-03-17 DIAGNOSIS — J939 Pneumothorax, unspecified: Secondary | ICD-10-CM | POA: Diagnosis not present

## 2022-03-17 DIAGNOSIS — Z79899 Other long term (current) drug therapy: Secondary | ICD-10-CM

## 2022-03-17 DIAGNOSIS — R918 Other nonspecific abnormal finding of lung field: Secondary | ICD-10-CM | POA: Diagnosis not present

## 2022-03-17 DIAGNOSIS — M25411 Effusion, right shoulder: Secondary | ICD-10-CM | POA: Diagnosis not present

## 2022-03-17 DIAGNOSIS — Z86000 Personal history of in-situ neoplasm of breast: Secondary | ICD-10-CM

## 2022-03-17 DIAGNOSIS — Z955 Presence of coronary angioplasty implant and graft: Secondary | ICD-10-CM

## 2022-03-17 DIAGNOSIS — Z8616 Personal history of COVID-19: Secondary | ICD-10-CM

## 2022-03-17 DIAGNOSIS — Z9889 Other specified postprocedural states: Secondary | ICD-10-CM

## 2022-03-17 DIAGNOSIS — M4326 Fusion of spine, lumbar region: Secondary | ICD-10-CM | POA: Diagnosis not present

## 2022-03-17 DIAGNOSIS — J439 Emphysema, unspecified: Secondary | ICD-10-CM | POA: Diagnosis not present

## 2022-03-17 DIAGNOSIS — Z902 Acquired absence of lung [part of]: Principal | ICD-10-CM

## 2022-03-17 DIAGNOSIS — E8770 Fluid overload, unspecified: Secondary | ICD-10-CM | POA: Diagnosis not present

## 2022-03-17 HISTORY — PX: INTERCOSTAL NERVE BLOCK: SHX5021

## 2022-03-17 HISTORY — PX: OTHER SURGICAL HISTORY: SHX169

## 2022-03-17 HISTORY — PX: LYMPH NODE DISSECTION: SHX5087

## 2022-03-17 HISTORY — DX: Malignant neoplasm of unspecified part of right bronchus or lung: C34.91

## 2022-03-17 HISTORY — PX: LUNG LOBECTOMY: SHX167

## 2022-03-17 LAB — BLOOD GAS, ARTERIAL
Acid-Base Excess: 4.5 mmol/L — ABNORMAL HIGH (ref 0.0–2.0)
Bicarbonate: 28.4 mmol/L — ABNORMAL HIGH (ref 20.0–28.0)
Drawn by: 29750
O2 Saturation: 94.9 %
Patient temperature: 37
pCO2 arterial: 39 mmHg (ref 32–48)
pH, Arterial: 7.47 — ABNORMAL HIGH (ref 7.35–7.45)
pO2, Arterial: 69 mmHg — ABNORMAL LOW (ref 83–108)

## 2022-03-17 LAB — PREPARE RBC (CROSSMATCH)

## 2022-03-17 SURGERY — LOBECTOMY, LUNG, ROBOT-ASSISTED, USING VATS
Anesthesia: General | Site: Chest | Laterality: Right

## 2022-03-17 MED ORDER — ONDANSETRON HCL 4 MG/2ML IJ SOLN
INTRAMUSCULAR | Status: AC
  Filled 2022-03-17: qty 2

## 2022-03-17 MED ORDER — CHLORHEXIDINE GLUCONATE 0.12 % MT SOLN
OROMUCOSAL | Status: AC
  Filled 2022-03-17: qty 15

## 2022-03-17 MED ORDER — ALBUTEROL SULFATE (2.5 MG/3ML) 0.083% IN NEBU
2.5000 mg | INHALATION_SOLUTION | RESPIRATORY_TRACT | Status: AC
  Administered 2022-03-17: 2.5 mg via RESPIRATORY_TRACT

## 2022-03-17 MED ORDER — ROCURONIUM BROMIDE 10 MG/ML (PF) SYRINGE
PREFILLED_SYRINGE | INTRAVENOUS | Status: AC
  Filled 2022-03-17: qty 10

## 2022-03-17 MED ORDER — KETOROLAC TROMETHAMINE 15 MG/ML IJ SOLN: 15.0000 mg | Freq: Four times a day (QID) | INTRAMUSCULAR | Status: DC

## 2022-03-17 MED ORDER — UMECLIDINIUM BROMIDE 62.5 MCG/ACT IN AEPB
1.0000 | INHALATION_SPRAY | Freq: Every day | RESPIRATORY_TRACT | Status: DC
  Administered 2022-03-18 – 2022-03-22 (×5): 1 via RESPIRATORY_TRACT
  Filled 2022-03-17: qty 7

## 2022-03-17 MED ORDER — ROCURONIUM BROMIDE 10 MG/ML (PF) SYRINGE
PREFILLED_SYRINGE | INTRAVENOUS | Status: DC | PRN
  Administered 2022-03-17: 40 mg via INTRAVENOUS
  Administered 2022-03-17: 60 mg via INTRAVENOUS
  Administered 2022-03-17 (×2): 20 mg via INTRAVENOUS

## 2022-03-17 MED ORDER — PROPOFOL 10 MG/ML IV BOLUS
INTRAVENOUS | Status: DC | PRN
  Administered 2022-03-17 (×5): 50 mg via INTRAVENOUS
  Administered 2022-03-17: 100 mg via INTRAVENOUS
  Administered 2022-03-17: 70 mg via INTRAVENOUS
  Administered 2022-03-17: 30 mg via INTRAVENOUS
  Administered 2022-03-17: 150 mg via INTRAVENOUS

## 2022-03-17 MED ORDER — EPHEDRINE SULFATE-NACL 50-0.9 MG/10ML-% IV SOSY
PREFILLED_SYRINGE | INTRAVENOUS | Status: DC | PRN
  Administered 2022-03-17: 2.5 mg via INTRAVENOUS
  Administered 2022-03-17: 5 mg via INTRAVENOUS

## 2022-03-17 MED ORDER — LACTATED RINGERS IV SOLN: INTRAVENOUS | Status: DC

## 2022-03-17 MED ORDER — FENTANYL CITRATE (PF) 100 MCG/2ML IJ SOLN
INTRAMUSCULAR | Status: AC
  Filled 2022-03-17: qty 2

## 2022-03-17 MED ORDER — 0.9 % SODIUM CHLORIDE (POUR BTL) OPTIME
TOPICAL | Status: DC | PRN
  Administered 2022-03-17: 2000 mL

## 2022-03-17 MED ORDER — FENTANYL CITRATE (PF) 250 MCG/5ML IJ SOLN
INTRAMUSCULAR | Status: AC
  Filled 2022-03-17: qty 5

## 2022-03-17 MED ORDER — ACETAMINOPHEN 500 MG PO TABS
ORAL_TABLET | ORAL | Status: AC
  Filled 2022-03-17: qty 2

## 2022-03-17 MED ORDER — ACETAMINOPHEN 500 MG PO TABS
1000.0000 mg | ORAL_TABLET | Freq: Four times a day (QID) | ORAL | Status: DC
  Administered 2022-03-17 – 2022-03-22 (×18): 1000 mg via ORAL
  Filled 2022-03-17 (×18): qty 2

## 2022-03-17 MED ORDER — MORPHINE SULFATE (PF) 2 MG/ML IV SOLN
2.0000 mg | INTRAVENOUS | Status: DC | PRN
  Administered 2022-03-17: 2 mg via INTRAVENOUS
  Filled 2022-03-17: qty 1

## 2022-03-17 MED ORDER — FENTANYL CITRATE (PF) 100 MCG/2ML IJ SOLN
25.0000 ug | INTRAMUSCULAR | Status: DC | PRN
  Administered 2022-03-17: 50 ug via INTRAVENOUS

## 2022-03-17 MED ORDER — BISACODYL 5 MG PO TBEC
10.0000 mg | DELAYED_RELEASE_TABLET | Freq: Every day | ORAL | Status: DC
  Administered 2022-03-18 – 2022-03-21 (×3): 10 mg via ORAL
  Filled 2022-03-17 (×5): qty 2

## 2022-03-17 MED ORDER — TRAMADOL HCL 50 MG PO TABS: 50.0000 mg | ORAL_TABLET | Freq: Four times a day (QID) | ORAL | Status: DC | PRN

## 2022-03-17 MED ORDER — CEFAZOLIN SODIUM-DEXTROSE 2-4 GM/100ML-% IV SOLN
2.0000 g | Freq: Three times a day (TID) | INTRAVENOUS | Status: AC
  Administered 2022-03-17 – 2022-03-18 (×2): 2 g via INTRAVENOUS
  Filled 2022-03-17 (×2): qty 100

## 2022-03-17 MED ORDER — ORAL CARE MOUTH RINSE: 15.0000 mL | OROMUCOSAL | Status: DC | PRN

## 2022-03-17 MED ORDER — PHENYLEPHRINE HCL-NACL 20-0.9 MG/250ML-% IV SOLN
INTRAVENOUS | Status: DC | PRN
  Administered 2022-03-17: 30 ug/min via INTRAVENOUS

## 2022-03-17 MED ORDER — OXYCODONE HCL 5 MG PO TABS
5.0000 mg | ORAL_TABLET | ORAL | Status: DC | PRN
  Administered 2022-03-18: 5 mg via ORAL
  Administered 2022-03-19 – 2022-03-20 (×2): 10 mg via ORAL
  Filled 2022-03-17 (×2): qty 2
  Filled 2022-03-17: qty 1

## 2022-03-17 MED ORDER — BUDESON-GLYCOPYRROL-FORMOTEROL 160-9-4.8 MCG/ACT IN AERO: 2.0000 | INHALATION_SPRAY | Freq: Two times a day (BID) | RESPIRATORY_TRACT | Status: DC

## 2022-03-17 MED ORDER — PROPOFOL 10 MG/ML IV BOLUS
INTRAVENOUS | Status: AC
Start: 2022-03-17 — End: ?
  Filled 2022-03-17: qty 20

## 2022-03-17 MED ORDER — ACETAMINOPHEN 500 MG PO TABS
1000.0000 mg | ORAL_TABLET | Freq: Once | ORAL | Status: AC
  Administered 2022-03-17: 1000 mg via ORAL

## 2022-03-17 MED ORDER — SERTRALINE HCL 100 MG PO TABS
100.0000 mg | ORAL_TABLET | Freq: Every day | ORAL | Status: DC
  Administered 2022-03-18 – 2022-03-22 (×5): 100 mg via ORAL
  Filled 2022-03-17 (×5): qty 1

## 2022-03-17 MED ORDER — BUPIVACAINE HCL (PF) 0.5 % IJ SOLN
INTRAMUSCULAR | Status: AC
  Filled 2022-03-17: qty 30

## 2022-03-17 MED ORDER — ALPRAZOLAM 0.25 MG PO TABS
0.2500 mg | ORAL_TABLET | Freq: Every day | ORAL | Status: DC
  Administered 2022-03-18 – 2022-03-21 (×4): 0.25 mg via ORAL
  Filled 2022-03-17 (×4): qty 1

## 2022-03-17 MED ORDER — SODIUM CHLORIDE 0.9% IV SOLUTION: Freq: Once | INTRAVENOUS | Status: AC

## 2022-03-17 MED ORDER — GABAPENTIN 100 MG PO CAPS
100.0000 mg | ORAL_CAPSULE | Freq: Two times a day (BID) | ORAL | Status: DC
  Administered 2022-03-17 – 2022-03-22 (×10): 100 mg via ORAL
  Filled 2022-03-17 (×10): qty 1

## 2022-03-17 MED ORDER — HEMOSTATIC AGENTS (NO CHARGE) OPTIME
TOPICAL | Status: DC | PRN
  Administered 2022-03-17 (×3): 1 via TOPICAL

## 2022-03-17 MED ORDER — SODIUM CHLORIDE 0.9% IV SOLUTION
INTRAVENOUS | Status: AC | PRN
  Administered 2022-03-17 (×2): 1000 mL

## 2022-03-17 MED ORDER — SODIUM CHLORIDE 0.9 % IV SOLN: INTRAVENOUS | Status: DC

## 2022-03-17 MED ORDER — LIDOCAINE 2% (20 MG/ML) 5 ML SYRINGE
INTRAMUSCULAR | Status: DC | PRN
  Administered 2022-03-17: 100 mg via INTRAVENOUS

## 2022-03-17 MED ORDER — ACETAMINOPHEN 160 MG/5ML PO SOLN: 1000.0000 mg | Freq: Four times a day (QID) | ORAL | Status: DC

## 2022-03-17 MED ORDER — SENNOSIDES-DOCUSATE SODIUM 8.6-50 MG PO TABS
1.0000 | ORAL_TABLET | Freq: Every day | ORAL | Status: DC
Start: 2022-03-17 — End: 2022-03-22
  Administered 2022-03-17 – 2022-03-21 (×4): 1 via ORAL
  Filled 2022-03-17 (×5): qty 1

## 2022-03-17 MED ORDER — MONTELUKAST SODIUM 10 MG PO TABS
10.0000 mg | ORAL_TABLET | Freq: Every day | ORAL | Status: DC
  Administered 2022-03-17 – 2022-03-21 (×5): 10 mg via ORAL
  Filled 2022-03-17 (×5): qty 1

## 2022-03-17 MED ORDER — CEFAZOLIN SODIUM-DEXTROSE 2-4 GM/100ML-% IV SOLN
2.0000 g | INTRAVENOUS | Status: AC
  Administered 2022-03-17: 2 g via INTRAVENOUS

## 2022-03-17 MED ORDER — ONDANSETRON HCL 4 MG/2ML IJ SOLN
INTRAMUSCULAR | Status: DC | PRN
  Administered 2022-03-17: 4 mg via INTRAVENOUS

## 2022-03-17 MED ORDER — LACTATED RINGERS IV SOLN: INTRAVENOUS | Status: DC | PRN

## 2022-03-17 MED ORDER — SODIUM CHLORIDE FLUSH 0.9 % IV SOLN
INTRAVENOUS | Status: DC | PRN
  Administered 2022-03-17: 100 mL

## 2022-03-17 MED ORDER — PANTOPRAZOLE SODIUM 40 MG PO TBEC
40.0000 mg | DELAYED_RELEASE_TABLET | Freq: Every day | ORAL | Status: DC
  Administered 2022-03-18 – 2022-03-22 (×5): 40 mg via ORAL
  Filled 2022-03-17 (×5): qty 1

## 2022-03-17 MED ORDER — FENTANYL CITRATE (PF) 250 MCG/5ML IJ SOLN
INTRAMUSCULAR | Status: DC | PRN
  Administered 2022-03-17: 100 ug via INTRAVENOUS
  Administered 2022-03-17 (×3): 50 ug via INTRAVENOUS

## 2022-03-17 MED ORDER — MIDAZOLAM HCL 2 MG/2ML IJ SOLN
INTRAMUSCULAR | Status: AC
  Filled 2022-03-17: qty 2

## 2022-03-17 MED ORDER — ALBUMIN HUMAN 5 % IV SOLN: INTRAVENOUS | Status: DC | PRN

## 2022-03-17 MED ORDER — ENOXAPARIN SODIUM 40 MG/0.4ML IJ SOSY
40.0000 mg | PREFILLED_SYRINGE | Freq: Every day | INTRAMUSCULAR | Status: DC
  Administered 2022-03-18 – 2022-03-22 (×5): 40 mg via SUBCUTANEOUS
  Filled 2022-03-17 (×5): qty 0.4

## 2022-03-17 MED ORDER — DEXAMETHASONE SODIUM PHOSPHATE 10 MG/ML IJ SOLN
INTRAMUSCULAR | Status: DC | PRN
  Administered 2022-03-17: 10 mg via INTRAVENOUS

## 2022-03-17 MED ORDER — ALBUTEROL SULFATE (2.5 MG/3ML) 0.083% IN NEBU
INHALATION_SOLUTION | RESPIRATORY_TRACT | Status: AC
  Filled 2022-03-17: qty 3

## 2022-03-17 MED ORDER — PHENYLEPHRINE 80 MCG/ML (10ML) SYRINGE FOR IV PUSH (FOR BLOOD PRESSURE SUPPORT)
PREFILLED_SYRINGE | INTRAVENOUS | Status: AC
  Filled 2022-03-17: qty 10

## 2022-03-17 MED ORDER — SUGAMMADEX SODIUM 200 MG/2ML IV SOLN
INTRAVENOUS | Status: DC | PRN
  Administered 2022-03-17: 200 mg via INTRAVENOUS

## 2022-03-17 MED ORDER — BUPIVACAINE LIPOSOME 1.3 % IJ SUSP
INTRAMUSCULAR | Status: AC
Start: 2022-03-17 — End: ?
  Filled 2022-03-17: qty 20

## 2022-03-17 MED ORDER — ONDANSETRON HCL 4 MG/2ML IJ SOLN: 4.0000 mg | Freq: Four times a day (QID) | INTRAMUSCULAR | Status: DC | PRN

## 2022-03-17 MED ORDER — ALBUTEROL SULFATE HFA 108 (90 BASE) MCG/ACT IN AERS: 1.0000 | INHALATION_SPRAY | Freq: Four times a day (QID) | RESPIRATORY_TRACT | Status: DC | PRN

## 2022-03-17 MED ORDER — EPHEDRINE 5 MG/ML INJ
INTRAVENOUS | Status: AC
  Filled 2022-03-17: qty 5

## 2022-03-17 MED ORDER — KETOROLAC TROMETHAMINE 30 MG/ML IJ SOLN
15.0000 mg | Freq: Four times a day (QID) | INTRAMUSCULAR | Status: DC
  Administered 2022-03-17 – 2022-03-22 (×19): 15 mg via INTRAVENOUS
  Filled 2022-03-17 (×18): qty 1

## 2022-03-17 MED ORDER — MOMETASONE FURO-FORMOTEROL FUM 200-5 MCG/ACT IN AERO
2.0000 | INHALATION_SPRAY | Freq: Two times a day (BID) | RESPIRATORY_TRACT | Status: DC
  Administered 2022-03-18 – 2022-03-22 (×9): 2 via RESPIRATORY_TRACT
  Filled 2022-03-17: qty 8.8

## 2022-03-17 MED ORDER — ANASTROZOLE 1 MG PO TABS
1.0000 mg | ORAL_TABLET | Freq: Every day | ORAL | Status: DC
Start: 2022-03-18 — End: 2022-03-22
  Administered 2022-03-18 – 2022-03-21 (×4): 1 mg via ORAL
  Filled 2022-03-17 (×5): qty 1

## 2022-03-17 MED ORDER — LIDOCAINE 2% (20 MG/ML) 5 ML SYRINGE
INTRAMUSCULAR | Status: AC
  Filled 2022-03-17: qty 5

## 2022-03-17 MED ORDER — CEFAZOLIN SODIUM-DEXTROSE 2-4 GM/100ML-% IV SOLN
INTRAVENOUS | Status: AC
  Filled 2022-03-17: qty 100

## 2022-03-17 MED ORDER — FOLIC ACID 1 MG PO TABS
2.0000 mg | ORAL_TABLET | Freq: Every morning | ORAL | Status: DC
  Administered 2022-03-18 – 2022-03-22 (×5): 2 mg via ORAL
  Filled 2022-03-17 (×5): qty 2

## 2022-03-17 MED ORDER — ROSUVASTATIN CALCIUM 20 MG PO TABS
40.0000 mg | ORAL_TABLET | Freq: Every evening | ORAL | Status: DC
  Filled 2022-03-17 (×3): qty 2

## 2022-03-17 MED ORDER — DEXAMETHASONE SODIUM PHOSPHATE 10 MG/ML IJ SOLN
INTRAMUSCULAR | Status: AC
  Filled 2022-03-17: qty 1

## 2022-03-17 MED ORDER — ORAL CARE MOUTH RINSE: 15.0000 mL | Freq: Once | OROMUCOSAL | Status: AC

## 2022-03-17 MED ORDER — MIDAZOLAM HCL 2 MG/2ML IJ SOLN
INTRAMUSCULAR | Status: DC | PRN
  Administered 2022-03-17: 2 mg via INTRAVENOUS

## 2022-03-17 MED ORDER — CHLORHEXIDINE GLUCONATE 0.12 % MT SOLN
15.0000 mL | Freq: Once | OROMUCOSAL | Status: AC
  Administered 2022-03-17: 15 mL via OROMUCOSAL

## 2022-03-17 MED ORDER — ALBUTEROL SULFATE (2.5 MG/3ML) 0.083% IN NEBU
2.5000 mg | INHALATION_SOLUTION | Freq: Four times a day (QID) | RESPIRATORY_TRACT | Status: DC | PRN
  Administered 2022-03-21: 2.5 mg via RESPIRATORY_TRACT
  Filled 2022-03-17: qty 3

## 2022-03-17 SURGICAL SUPPLY — 107 items
BLADE CLIPPER SURG (BLADE) IMPLANT
CANISTER SUCT 3000ML PPV (MISCELLANEOUS) ×4 IMPLANT
CANNULA REDUC XI 12-8 STAPL (CANNULA) ×2
CANNULA REDUCER 12-8 DVNC XI (CANNULA) ×2 IMPLANT
CLIP VESOCCLUDE MED 6/CT (CLIP) IMPLANT
CNTNR URN SCR LID CUP LEK RST (MISCELLANEOUS) ×5 IMPLANT
CONN ST 1/4X3/8  BEN (MISCELLANEOUS)
CONN ST 1/4X3/8 BEN (MISCELLANEOUS) IMPLANT
CONN Y 3/8X3/8X3/8  BEN (MISCELLANEOUS)
CONN Y 3/8X3/8X3/8 BEN (MISCELLANEOUS) IMPLANT
CONT SPEC 4OZ STRL OR WHT (MISCELLANEOUS) ×18
DEFOGGER SCOPE WARMER CLEARIFY (MISCELLANEOUS) ×2 IMPLANT
DERMABOND ADVANCED (GAUZE/BANDAGES/DRESSINGS) ×1
DERMABOND ADVANCED .7 DNX12 (GAUZE/BANDAGES/DRESSINGS) ×1 IMPLANT
DRAIN CHANNEL 28F RND 3/8 FF (WOUND CARE) ×1 IMPLANT
DRAIN CHANNEL 32F RND 10.7 FF (WOUND CARE) IMPLANT
DRAPE ARM DVNC X/XI (DISPOSABLE) ×4 IMPLANT
DRAPE COLUMN DVNC XI (DISPOSABLE) ×1 IMPLANT
DRAPE CV SPLIT W-CLR ANES SCRN (DRAPES) ×2 IMPLANT
DRAPE DA VINCI XI ARM (DISPOSABLE) ×4
DRAPE DA VINCI XI COLUMN (DISPOSABLE) ×1
DRAPE HALF SHEET 40X57 (DRAPES) ×2 IMPLANT
DRAPE INCISE IOBAN 66X45 STRL (DRAPES) IMPLANT
DRAPE ORTHO SPLIT 77X108 STRL (DRAPES) ×1
DRAPE SURG ORHT 6 SPLT 77X108 (DRAPES) ×1 IMPLANT
ELECT BLADE 6.5 EXT (BLADE) ×2 IMPLANT
ELECT REM PT RETURN 9FT ADLT (ELECTROSURGICAL) ×2
ELECTRODE REM PT RTRN 9FT ADLT (ELECTROSURGICAL) ×1 IMPLANT
GAUZE 4X4 16PLY ~~LOC~~+RFID DBL (SPONGE) ×1 IMPLANT
GAUZE KITTNER 4X5 RF (MISCELLANEOUS) ×7 IMPLANT
GAUZE SPONGE 4X4 12PLY STRL (GAUZE/BANDAGES/DRESSINGS) ×3 IMPLANT
GLOVE SURG MICRO LTX SZ7.5 (GLOVE) ×6 IMPLANT
GLOVE SURG POLYISO LF SZ8 (GLOVE) ×2 IMPLANT
GLOVE SURG SS PI 8.0 STRL IVOR (GLOVE) ×2 IMPLANT
GOWN STRL REUS W/ TWL LRG LVL3 (GOWN DISPOSABLE) ×2 IMPLANT
GOWN STRL REUS W/ TWL XL LVL3 (GOWN DISPOSABLE) ×2 IMPLANT
GOWN STRL REUS W/TWL 2XL LVL3 (GOWN DISPOSABLE) ×2 IMPLANT
GOWN STRL REUS W/TWL LRG LVL3 (GOWN DISPOSABLE) ×2
GOWN STRL REUS W/TWL XL LVL3 (GOWN DISPOSABLE) ×2
HEMOSTAT SURGICEL 2X14 (HEMOSTASIS) ×6 IMPLANT
IRRIGATION STRYKERFLOW (MISCELLANEOUS) ×1 IMPLANT
IRRIGATOR STRYKERFLOW (MISCELLANEOUS) ×2
KIT BASIN OR (CUSTOM PROCEDURE TRAY) ×2 IMPLANT
KIT SUCTION CATH 14FR (SUCTIONS) IMPLANT
KIT TURNOVER KIT B (KITS) ×2 IMPLANT
NDL BIOPSY 14X6 SOFT TISS (NEEDLE) IMPLANT
NDL HYPO 25GX1X1/2 BEV (NEEDLE) ×1 IMPLANT
NDL SPNL 22GX3.5 QUINCKE BK (NEEDLE) ×1 IMPLANT
NEEDLE BIOPSY 14X6 SOFT TISS (NEEDLE) ×4 IMPLANT
NEEDLE HYPO 25GX1X1/2 BEV (NEEDLE) ×2 IMPLANT
NEEDLE SPNL 22GX3.5 QUINCKE BK (NEEDLE) ×2 IMPLANT
NS IRRIG 1000ML POUR BTL (IV SOLUTION) ×4 IMPLANT
PACK CHEST (CUSTOM PROCEDURE TRAY) ×2 IMPLANT
PAD ARMBOARD 7.5X6 YLW CONV (MISCELLANEOUS) ×4 IMPLANT
PORT ACCESS TROCAR AIRSEAL 12 (TROCAR) ×1 IMPLANT
PORT ACCESS TROCAR AIRSEAL 5M (TROCAR) ×1
RELOAD STAPLE 45 2.5 WHT DVNC (STAPLE) IMPLANT
RELOAD STAPLE 45 3.5 BLU DVNC (STAPLE) IMPLANT
RELOAD STAPLE 45 4.3 GRN DVNC (STAPLE) IMPLANT
RELOAD STAPLER 2.5X45 WHT DVNC (STAPLE) ×4 IMPLANT
RELOAD STAPLER 3.5X45 BLU DVNC (STAPLE) ×6 IMPLANT
RELOAD STAPLER 4.3X45 GRN DVNC (STAPLE) ×3 IMPLANT
SCISSORS LAP 5X35 DISP (ENDOMECHANICALS) IMPLANT
SEAL CANN UNIV 5-8 DVNC XI (MISCELLANEOUS) ×2 IMPLANT
SEAL XI 5MM-8MM UNIVERSAL (MISCELLANEOUS) ×2
SET TRI-LUMEN FLTR TB AIRSEAL (TUBING) ×2 IMPLANT
SHEARS HARMONIC HDI 20CM (ELECTROSURGICAL) IMPLANT
SOLUTION ELECTROLUBE (MISCELLANEOUS) ×2 IMPLANT
SPONGE INTESTINAL PEANUT (DISPOSABLE) IMPLANT
SPONGE T-LAP 18X18 ~~LOC~~+RFID (SPONGE) ×4 IMPLANT
SPONGE T-LAP 4X18 ~~LOC~~+RFID (SPONGE) ×1 IMPLANT
SPONGE TONSIL TAPE 1 RFD (DISPOSABLE) IMPLANT
STAPLER 45 SUREFORM CVD (STAPLE) ×1
STAPLER 45 SUREFORM CVD DVNC (STAPLE) IMPLANT
STAPLER CANNULA SEAL DVNC XI (STAPLE) ×2 IMPLANT
STAPLER CANNULA SEAL XI (STAPLE) ×2
STAPLER RELOAD 2.5X45 WHITE (STAPLE) ×4
STAPLER RELOAD 2.5X45 WHT DVNC (STAPLE) ×4
STAPLER RELOAD 3.5X45 BLU DVNC (STAPLE) ×6
STAPLER RELOAD 3.5X45 BLUE (STAPLE) ×6
STAPLER RELOAD 4.3X45 GREEN (STAPLE) ×3
STAPLER RELOAD 4.3X45 GRN DVNC (STAPLE) ×3
SUT PDS AB 3-0 SH 27 (SUTURE) IMPLANT
SUT PROLENE 4 0 RB 1 (SUTURE)
SUT PROLENE 4-0 RB1 .5 CRCL 36 (SUTURE) IMPLANT
SUT SILK  1 MH (SUTURE) ×1
SUT SILK 1 MH (SUTURE) ×1 IMPLANT
SUT SILK 1 TIES 10X30 (SUTURE) ×1 IMPLANT
SUT SILK 2 0 SH (SUTURE) ×2 IMPLANT
SUT SILK 2 0SH CR/8 30 (SUTURE) IMPLANT
SUT SILK 3 0SH CR/8 30 (SUTURE) IMPLANT
SUT VIC AB 1 CTX 36 (SUTURE) ×1
SUT VIC AB 1 CTX36XBRD ANBCTR (SUTURE) ×1 IMPLANT
SUT VIC AB 2-0 CTX 36 (SUTURE) ×2 IMPLANT
SUT VIC AB 3-0 MH 27 (SUTURE) IMPLANT
SUT VIC AB 3-0 X1 27 (SUTURE) ×4 IMPLANT
SUT VICRYL 0 TIES 12 18 (SUTURE) ×2 IMPLANT
SUT VICRYL 0 UR6 27IN ABS (SUTURE) ×4 IMPLANT
SUT VICRYL 2 TP 1 (SUTURE) IMPLANT
SYR 20CC LL (SYRINGE) ×4 IMPLANT
SYSTEM RETRIEVAL ANCHOR 12 (MISCELLANEOUS) ×1 IMPLANT
SYSTEM SAHARA CHEST DRAIN ATS (WOUND CARE) ×2 IMPLANT
TAPE CLOTH 4X10 WHT NS (GAUZE/BANDAGES/DRESSINGS) ×2 IMPLANT
TOWEL GREEN STERILE (TOWEL DISPOSABLE) ×2 IMPLANT
TRAY FOLEY MTR SLVR 16FR STAT (SET/KITS/TRAYS/PACK) ×1 IMPLANT
TRAY FOLEY SLVR 14FR TEMP STAT (SET/KITS/TRAYS/PACK) ×1 IMPLANT
WATER STERILE IRR 1000ML POUR (IV SOLUTION) ×4 IMPLANT

## 2022-03-17 NOTE — Hospital Course (Addendum)
HPI: This is a is a 66 year old woman with a past medical history significant for tobacco abuse, COPD, CAD, MI, thoracic aortic atherosclerosis, hypertension, hyperlipidemia, reflux, anxiety, depression, rheumatoid arthritis, bladder mass, thyroid nodule, and DCIS of the breast.  She has a 45-pack-year history of smoking prior to quitting in 2017.  She had COVID earlier this year.  In February she presented with a 3-week cough.  A chest x-ray showed right upper lobe opacity.  That led to a CT of the chest in late March which showed a 1.9 x 1.4 x 2.5 cm spiculated nodule in anterior upper lobe.  A PET/CT was then done about a month later which showed the nodule measured 2.2 x 1.9 cm and was markedly hypermetabolic with an SUV of 26.8.  There was mild uptake in a paratracheal node which was indeterminate.  There was some uptake in the right lateral seventh rib in the area of a fracture.  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  She then had an endobronchial ultrasound to evaluate the right paratracheal node.  Aspirations showed lymphoid cells but no evidence of metastasis.   She gets short of breath with exertion but can walk up a flight of stairs without stopping.  She recently has been having a burning sensation in her neck.  This is not associated with eating or exertion.  She says it started after she began using her Breztri inhaler.  She denies any change in appetite or weight loss.  No chest pain, pressure, or tightness.   She had a cardiology work up with Dr. Nehemiah Massed. She was cleared to undergo thoracic surgery. Dr.Hendrickson discussed the need for robotic assisted right VATS, RUL, possible chest wall resection, possible conversion to a right thoracotomy. Potential risks, benefits, and complications of the surgery were discussed with the patient and she agreed to proceed with surgery. Of note, she had a repeat CT of the chest done on 06/30 which showed no  evidence of metastatic disease,a spiculated nodule in the anterior RUL, and no changed in the 8 mm right paratracheal mediastinal lymph node.   Hospital Course: She underwent a robotic assisted right VATS, RUL, LN dissection, and intercostal nerve block. She was transported from the OR to PACU in stable condition.  She was extubated routinely and later transferred to [facility].  By the morning of the first postoperative day, she was maintaining adequate oxygen saturation on room air.  She had some pain in her right shoulder but this was well controlled with the analgesics made available to her.  There was a small air leak.  Chest tube was left to waterseal.  Chest x-ray was satisfactory with expected postoperative changes.  She was started on enoxaparin for DVT prophylaxis per protocol.  She was mobilized.  Diet was advanced and well-tolerated.  Her air leak resolved and her chest tube was removed on 03/20/2022.  Follow up CXR showed an expected air fluid level post removal of a lobe.  There was also persistent atelectasis.  She was started on Mucinex for congestion.  She was also treated with a 1 time dose of Mucomyst  She was weaning off oxygen as tolerated.  Her surgical incisions are healing without evidence of infection.  She is ambulating without difficulty.  She is medically stable for discharge home today.

## 2022-03-17 NOTE — Anesthesia Procedure Notes (Addendum)
Procedure Name: Intubation Date/Time: 03/17/2022 1:35 PM  Performed by: Janene Harvey, CRNAPre-anesthesia Checklist: Patient identified, Emergency Drugs available, Suction available and Patient being monitored Patient Re-evaluated:Patient Re-evaluated prior to induction Oxygen Delivery Method: Circle system utilized Preoxygenation: Pre-oxygenation with 100% oxygen Induction Type: IV induction Ventilation: Mask ventilation without difficulty, Oral airway inserted - appropriate to patient size and Two handed mask ventilation required Laryngoscope Size: Glidescope and 3 Grade View: Grade I Tube type: Oral Endobronchial tube: Left, Double lumen EBT, EBT position confirmed by auscultation and EBT position confirmed by fiberoptic bronchoscope and 35 Fr Number of attempts: 3 Airway Equipment and Method: Stylet and Oral airway Placement Confirmation: ETT inserted through vocal cords under direct vision, positive ETCO2 and breath sounds checked- equal and bilateral Secured at: 27 cm Tube secured with: Tape Dental Injury: Teeth and Oropharynx as per pre-operative assessment  Difficulty Due To: Difficulty was anticipated, Difficult Airway- due to anterior larynx and Difficult Airway- due to limited oral opening Comments: Pt known documented difficult airway. Pt two hand mask with oral airway. DL with glidescope 3 by CRNA, grade I view. Unable to pass 35 DLT through glottic opening. MDA also attempted, unable to pass. Pt easily masked with sevo/oral airway, additional medications given per anesthesia record. Elected to place single lumen ett and use cooke catheter for DLT placement. DL with glidescope 3, attempted to place 8.0 ett, unable to pass, down sized to 6.5 with successful placement. Cooke catheter easily passed and ett removed. DL with glidescope to confirm exchange cather placement before advancing DLT over catheter. DLT successfully placed by MDA.

## 2022-03-17 NOTE — Progress Notes (Addendum)
  Perioperative Services Pre-Admission/Anesthesia Testing     Date: 03/17/22  Name: Michelle Moore MRN:   [id number]  Re: Surgical clearance for urological procedure  Surgical clearance received from Dr. Serafina Royals  (cardiology). Patient has been cleared for the planned TRANSURETHRAL RESECTION OF BLADDER TUMOR (TURBT); BLADDER INSTILLATION OF GEMCITABINE; CYSTOSCOPY WITH RETROGRADE PYELOGRAM procedure scheduled for 04/04/2022 with Dr. Hollice Espy.  Cardiology notes that patient may proceed with an overall LOW risk stratification. In review of her medication reconciliation, the patient is not noted to be taking any type of anticoagulation or antiplatelet therapies that would need to be held during her perioperative course. Copy of signed clearance form placed on patient's OR chart for review by the surgical/anesthetic team on the day of his procedure.   ADDENDUM: 03/17/2022 at 0900 -received communication from Dr. Cherrie Gauze office that procedure had been postponed until 05/02/2022.  Apparently patient is undergoing a robotic assisted thorascopy for a RIGHT upper lobe lobectomy today at [facility].  Dr. Erlene Quan is requesting further clearance to be obtained from CVTS team following patient's pulmonary procedure.  As she is seen in postoperative follow-up by CVTS team, hopefully they will provide documentation in their clinical notes regarding patient's ability to proceed with urological procedure.  If this not the case when she comes through [place] to pre-op for her procedure with Dr. Erlene Quan, I will plan on sending formal clearance documentation to patient's CVTS surgeon [name], MD) in efforts to obtain appropriate clearance for her subsequent procedure.  [name], MSN, APRN, FNP-C, CEN [facility]  Peri-operative Services Nurse Practitioner Phone: [phone] 03/17/22 8:36 AM

## 2022-03-17 NOTE — Anesthesia Procedure Notes (Signed)
Arterial Line Insertion Start/End7/02/2022 12:40 PM Performed by: Janene Harvey, CRNA  Patient location: Pre-op. Preanesthetic checklist: patient identified, risks and benefits discussed, monitors and equipment checked and pre-op evaluation Lidocaine 1% used for infiltration Left, radial was placed Catheter size: 20 G Hand hygiene performed  and maximum sterile barriers used  Allen's test indicative of satisfactory collateral circulation Attempts: 2 Procedure performed without using ultrasound guided technique. Following insertion, dressing applied and Biopatch. Post procedure assessment: unchanged  Patient tolerated the procedure well with no immediate complications.

## 2022-03-17 NOTE — Interval H&P Note (Signed)
History and Physical Interval Note:  03/17/2022 1:00 PM  Michelle Moore  has presented today for surgery, with the diagnosis of RUL SQUAMOUS CELL CARCINOMA.  The various methods of treatment have been discussed with the patient and family. After consideration of risks, benefits and other options for treatment, the patient has consented to  Procedure(s): XI ROBOTIC ASSISTED THORASCOPY-RIGHT UPPER LOBECTOMY (Right) as a surgical intervention.  The patient's history has been reviewed, patient examined, no change in status, stable for surgery.  I have reviewed the patient's chart and labs.  Questions were answered to the patient's satisfaction.     Melrose Nakayama

## 2022-03-17 NOTE — Transfer of Care (Signed)
Immediate Anesthesia Transfer of Care Note  Patient: Michelle Moore  Procedure(s) Performed: XI ROBOTIC ASSISTED THORASCOPY-RIGHT UPPER LOBECTOMY (Right: Chest) LYMPH NODE DISSECTION (Right: Chest) INTERCOSTAL NERVE BLOCK (Right: Chest)  Patient Location: PACU  Anesthesia Type:General  Level of Consciousness: drowsy and patient cooperative  Airway & Oxygen Therapy: Patient Spontanous Breathing and Patient connected to face mask oxygen  Post-op Assessment: Report given to RN, Post -op Vital signs reviewed and stable and Patient moving all extremities  Post vital signs: Reviewed and stable  Last Vitals:  Vitals Value Taken Time  BP 150/75 03/17/22 1848  Temp    Pulse 73 03/17/22 1853  Resp 20 03/17/22 1853  SpO2 90 % 03/17/22 1853  Vitals shown include unvalidated device data.  Last Pain:  Vitals:   03/17/22 1028  PainSc: 3       Patients Stated Pain Goal: 2 (82/51/89 8421)  Complications:  Encounter Notable Events  Notable Event Outcome Phase Comment  Difficult to intubate - expected  Intraprocedure Filed from anesthesia note documentation.

## 2022-03-17 NOTE — Anesthesia Postprocedure Evaluation (Signed)
Anesthesia Post Note  Patient: LYLEE CORROW  Procedure(s) Performed: XI ROBOTIC ASSISTED THORASCOPY-RIGHT UPPER LOBECTOMY (Right: Chest) LYMPH NODE DISSECTION (Right: Chest) INTERCOSTAL NERVE BLOCK (Right: Chest)     Patient location during evaluation: PACU Anesthesia Type: General Level of consciousness: sedated Pain management: pain level controlled Vital Signs Assessment: post-procedure vital signs reviewed and stable Respiratory status: spontaneous breathing and respiratory function stable Cardiovascular status: stable Postop Assessment: no apparent nausea or vomiting Anesthetic complications: yes   Encounter Notable Events  Notable Event Outcome Phase Comment  Difficult to intubate - expected  Intraprocedure Filed from anesthesia note documentation.    Last Vitals:  Vitals:   03/17/22 1950 03/17/22 2010  BP: 116/61 (!) 151/63  Pulse: 72 71  Resp: 14 20  Temp: 36.5 C 36.6 C  SpO2: 95% 95%    Last Pain:  Vitals:   03/17/22 2010  TempSrc: Oral  PainSc: 3                  Tyler Cubit DANIEL

## 2022-03-17 NOTE — Brief Op Note (Addendum)
03/17/2022  6:19 PM  PATIENT:  Michelle Moore  66 y.o. female  PRE-OPERATIVE DIAGNOSIS:  RUL SQUAMOUS CELL CARCINOMA  POST-OPERATIVE DIAGNOSIS:  RUL SQUAMOUS CELL CARCINOMA  PROCEDURE:   XI ROBOTIC ASSISTED RIGHT THORASCOPY,  EXTRAPLEURAL RIGHT UPPER LOBECTOMY, LYMPH NODE DISSECTION , and  INTERCOSTAL NERVE BLOCKS (levels 3 through 10)  SURGEON:  Surgeon(s) and Role:    Melrose Nakayama, MD - Primary  PHYSICIAN ASSISTANTS: 1. Enid Cutter PA-C 2. Donielle Zimmerman PA-C  ANESTHESIA:   general  EBL:  150 mL   BLOOD ADMINISTERED:none  DRAINS:  28 Blake drain placed in the right pleural space    LOCAL MEDICATIONS USED:  OTHER Exparel  SPECIMEN:  Source of Specimen:  RUL, multiple lymph nodes, chest wall biopsies  DISPOSITION OF SPECIMEN:  PATHOLOGY  COUNTS CORRECT:  YES  DICTATION: .Dragon Dictation  PLAN OF CARE: Admit to inpatient   PATIENT DISPOSITION:  PACU - hemodynamically stable.   Delay start of Pharmacological VTE agent (>24hrs) due to surgical blood loss or risk of bleeding: no  Chest wall biopsies at 7th rob negative for tumor Adherent to chest wall in area of mass- resected in extrapleural plane, margins negative

## 2022-03-17 NOTE — Op Note (Signed)
NAME: ELYSABETH, AUST MEDICAL RECORD NO: [id number] ACCOUNT NO: [id number] DATE OF BIRTH: 1956-08-22 FACILITY: [facility] LOCATION: [facility] PHYSICIAN: Revonda Standard. [name], MD  Operative Report   DATE OF PROCEDURE: 03/17/2022  PREOPERATIVE DIAGNOSIS:  Squamous cell carcinoma, right upper lobe, clinical stage IB (T2, N0).  POSTOPERATIVE DIAGNOSIS:  Squamous cell carcinoma, right upper lobe, clinical stage IB (T2, N0).  PROCEDURE:   Xi robotic-assisted extrapleural right upper lobectomy,  Lymph node dissection and  Intercostal nerve blocks levels 3 through 10.  SURGEON:  Revonda Standard. [name], MD  ASSISTANT:  Enid Cutter, PA and Lars Pinks, [place]  ANESTHESIA:  General.  FINDINGS:  Adhesions in the area of tumor. Resected with extrapleural plane. Margins in that area negative for tumor.  Biopsies from the area of seventh rib fracture negative for tumor.  Enlarged 4R and level 10 lymph nodes negative for tumor. Bronchial margin negative.  CLINICAL NOTE: Michelle Moore is a 66 year old woman with a history of tobacco abuse, who presented with a persistent cough.  She was found to have a right upper lobe opacity.  CT and PET/CT showed approximately 2 cm mass that was abutting the chest wall.   It was hypermetabolic.  She had a CT-guided biopsy of the lung nodule, which showed squamous cell carcinoma.  Endobronchial ultrasound and aspiration of the right paratracheal node showed lymphoid cells, but no evidence of metastatic disease.  She also  had a seventh rib fracture on the right, but a followup CT 2 months after the PET showed some evidence of healing and no evidence of mass lesion at the rib.  She was offered surgical resection.  The indications, risks, benefits, and alternatives were  discussed in detail with the patient.  She understood and accepted the risks and agreed to proceed.  OPERATIVE NOTE: Michelle Moore was brought to the preoperative holding area on 03/17/2022.  Anesthesia  placed an arterial blood pressure monitoring line and established intravenous access.  She was taken to the operating room and anesthetized and intubated  with a double lumen endotracheal tube.  Intravenous antibiotics were administered.  Foley catheter was placed.  Sequential compression devices were placed on the calves for DVT prophylaxis.  She was placed in a left lateral decubitus position and the  right chest was prepped and draped in the usual sterile fashion.  Single lung ventilation of the left lung was initiated and was tolerated well throughout the procedure.  A timeout was performed and solution containing 20 mL of liposomal bupivacaine, 30 mL of 0.5% bupivacaine and 50 mL of saline was prepared.  This solution was used for local at the incision sites as well as for the intercostal nerve blocks.  An incision was made  in the eighth interspace in the midaxillary line, an 8 mm port was inserted.  The thoracoscope was advanced into the chest. After confirming intrapleural placement, carbon dioxide was insufflated per protocol.  A 12 mm robotic port was placed in the  eighth interspace anterior to the camera port.  Inspection of the chest showed the right upper lobe was adherent to the chest wall anteriorly. Intercostal nerve blocks were performed from the third to the tenth interspace.  10 mL of the bupivacaine  solution was injected into a subpleural plane at each level. A 12 mm AirSeal port was placed posterolaterally in the tenth interspace.  Two additional robotic ports were placed in the eighth interspace. The robot was deployed.  The camera arm was docked.   The scope was  inserted. Targeting was performed.  The remaining arms were docked and the robotic instruments were inserted with thoracoscopic visualization.  The right upper lobe as noted was adherent to the chest wall in the area of the tumor.  Initial adhesions were taken down.  They were thin and flimsy, but as the mass was  approached, the plane became less distinct and an extrapleural plane was developed and  the mass was freed up circumferentially, leaving the parietal pleura in place on the mass.  Biopsies then were taken from the deep margin as well as the anterior, posterior, superior and inferior margins.  These were sent for frozen section.  While  awaiting those results, the scope was inverted and the area of the rib fracture was identified.  Biopsies were taken of the pleura in this area and then also of the soft tissue adjacent to the fracture and then Tru-Cut needle biopsies were taken as well.   These were sent for frozen section and those returned with no tumor seen either.  The lower lobe was retracted superiorly.  The inferior ligament was divided with bipolar cautery.  Level 9 nodes were removed.  The pleural reflection was divided at the  hilum posteriorly.  There was a markedly enlarged, but otherwise benign-appearing level 7 node that was dissected out.  There was some bleeding with this dissection.  Additional level 7 nodes were also removed as well as a level 8 nodes.  Working further  superiorly, the pleura was divided and the level 11 nodes were dissected out at the bifurcation of the right upper lobe bronchus from the bronchus intermedius. The upper lobe then was retracted inferiorly.  Level 10 nodes were removed, one of these  nodes was rather enlarged and abnormal-appearing.  It was sent for frozen section.  The pleural reflection was divided over the mediastinum superior to the pulmonary vein.  There were multiple enlarged level 4R nodes.  These were removed.  There was some  bleeding and Surgicel was packed into that area.  The largest and most abnormal 4R node was sent for frozen section.  Both the lymph nodes subsequently returned with no tumor seen.  The pleural reflection was divided at the hilum anteriorly.  The  pulmonary artery was visible at the confluence of the major and minor fissures. The  pleura overlying it was incised and dissection was carried along the pulmonary artery. The posterior ascending branches and the superior segmental branch of the lower  lobe were identified and the plane was developed between them.  The major fissure was completed posteriorly with sequential firings of the robotic stapler.  The posterior ascending pulmonary artery branches then were encircled and divided with the  robotic stapler using a vascular cartridge.  Dissection then was begun on the minor fissure, working from both anterior and posterior approaches.  It required multiple firings of the stapler to complete the minor fissure.  The major fissure between the  middle lobe and lower lobe was left intact.  The dissection was carried superiorly. Again, there were multiple enlarged nodes, but these all appeared benign and anterior branch to the upper lobe was encircled and divided with the robotic stapler.  The  pulmonary veins had been divided prior to dividing that branch.  There was a large anterior apical trunk that gave off three branches and there was a large node between the bronchus and this artery.  This dissection took a considerable period of time,  but once the artery had been  freed up, it was divided with the robotic stapler.  Stapler then was placed across the right upper lobe bronchus.  A green cartridge was then placed.  The stapler was closed.  A test inflation showed good aeration of the  lower and middle lobes.  The stapler then was fired transecting the right upper lobe bronchus.  The vessel loop and sponges that had been used during the dissection were removed.  The chest was copiously irrigated with warm saline.  A test inflation to  30 cm of water revealed no leakage from the bronchial stump.  The robotic instruments were removed.  The robot was undocked.  The anterior eighth interspace incision was lengthened to 4 cm.  A 12 mm endoscopic retrieval bag was placed into the chest.   The  upper lobe was manipulated into the bag and then removed and sent for frozen section. The bronchial margin was subsequently returned with no tumor seen.  Final inspection was made for hemostasis.  A 28-French Blake drain was placed through the  original port incision and directed to the apex.  It was secured with a #1 silk suture. The remaining incisions were closed in standard fashion.  The chest tube was placed to a Pleur-Evac on waterseal.  The patient was placed back in supine position.   She was extubated in the operating room and taken to the postanesthetic care unit in good condition.  All sponge, needle and instrument counts were correct at the end of the procedure.  Experienced assistance was necessary for this case due to surgical complexity.  Enid Cutter and then [employer] assisted with port placement, instrument exchange, suctioning, specimen retrieval, and wound closure.      PAA D: 03/17/2022 7:25:09 pm T: 03/17/2022 10:36:00 pm  JOB: [id number]/ [id number]

## 2022-03-18 ENCOUNTER — Inpatient Hospital Stay (HOSPITAL_COMMUNITY): Payer: Medicare HMO

## 2022-03-18 ENCOUNTER — Encounter (HOSPITAL_COMMUNITY): Payer: Self-pay | Admitting: Thoracic Surgery (Cardiothoracic Vascular Surgery)

## 2022-03-18 LAB — BASIC METABOLIC PANEL
Anion gap: 10 (ref 5–15)
BUN: 8 mg/dL (ref 8–23)
CO2: 24 mmol/L (ref 22–32)
Calcium: 8.2 mg/dL — ABNORMAL LOW (ref 8.9–10.3)
Chloride: 106 mmol/L (ref 98–111)
Creatinine, Ser: 0.58 mg/dL (ref 0.44–1.00)
GFR, Estimated: >60 mL/min (ref >60)
Glucose, Bld: 148 mg/dL — ABNORMAL HIGH (ref 70–99)
Potassium: 4.2 mmol/L (ref 3.5–5.1)
Sodium: 140 mmol/L (ref 135–145)

## 2022-03-18 LAB — CBC
HCT: 39 % (ref 36.0–46.0)
Hemoglobin: 11.9 g/dL — ABNORMAL LOW (ref 12.0–15.0)
MCH: 29.6 pg (ref 26.0–34.0)
MCHC: 30.5 g/dL (ref 30.0–36.0)
MCV: 97 fL (ref 80.0–100.0)
Platelets: 158 10*3/uL (ref 150–400)
RBC: 4.02 MIL/uL (ref 3.87–5.11)
RDW: 14 % (ref 11.5–15.5)
WBC: 12.7 10*3/uL — ABNORMAL HIGH (ref 4.0–10.5)
nRBC: 0 % (ref 0.0–0.2)

## 2022-03-18 MED ORDER — POTASSIUM CHLORIDE CRYS ER 10 MEQ PO TBCR
10.0000 meq | EXTENDED_RELEASE_TABLET | Freq: Every day | ORAL | Status: DC
  Administered 2022-03-18 – 2022-03-20 (×3): 10 meq via ORAL
  Filled 2022-03-18 (×5): qty 1

## 2022-03-18 MED ORDER — TRAMADOL HCL 50 MG PO TABS: 50.0000 mg | ORAL_TABLET | Freq: Four times a day (QID) | ORAL | 0 refills | Status: DC | PRN

## 2022-03-18 MED ORDER — TRAMADOL HCL 50 MG PO TABS
50.0000 mg | ORAL_TABLET | Freq: Four times a day (QID) | ORAL | Status: DC | PRN
  Administered 2022-03-18: 100 mg via ORAL
  Administered 2022-03-19: 50 mg via ORAL
  Filled 2022-03-18: qty 2
  Filled 2022-03-18: qty 1

## 2022-03-18 NOTE — TOC Progression Note (Signed)
Transition of Care [facility]) - Progression Note    Patient Details  Name: Michelle Moore MRN: [id number] Date of Birth: 23-Jun-1956  Transition of Care [facility]) CM/SW [place], RN Phone Number:[phone]  03/18/2022, 12:24 PM  Clinical Narrative:     Transition of Care [facility]) Screening Note   Patient Details  Name: Michelle Moore Date of Birth: 08/14/1956   Transition of Care [facility]) CM/SW Contact:    Angelita Ingles, RN Phone Number: 03/18/2022, 12:25 PM    Transition of Care Department [facility]) has reviewed patient and no TOC needs have been identified at this time. We will continue to monitor patient advancement through interdisciplinary progression rounds.           Expected Discharge Plan and Services                                                 Social Determinants of Health (SDOH) Interventions    Readmission Risk Interventions     No data to display

## 2022-03-18 NOTE — Progress Notes (Signed)
Mobility Specialist Progress Note    03/18/22 1118  Mobility  Activity Ambulated with assistance in hallway  Level of Assistance Contact guard assist, steadying assist  Assistive Device Front wheel walker  Distance Ambulated (ft) 340 ft  Activity Response Tolerated well  $Mobility charge 1 Mobility   Pre-Mobility: 66 HR, 130/77 BP, 99% SpO2 During Mobility: 78 HR, 90% on 2LO2 SpO2 Post-Mobility: 65 HR, 97% SpO2  Pt received in bed and agreeable. C/o 3/10 pain. Encourgaed pursed lip breathing. Pt desatted into low 80s on RA and required 2LO2 to maintain SpO2 89-92%. Pt did have productive cough. Returned to chair with call bell in reach.    Hildred Alamin Mobility Specialist

## 2022-03-18 NOTE — Discharge Summary (Addendum)
Physician Discharge Summary  Patient ID: Michelle Moore MRN: [id number] DOB/AGE: November 08, 1955 66 y.o.  Admit date: 03/17/2022 Discharge date: 03/22/2022  Admission Diagnoses:  Clinical stage IB (T2, N0) squamous cell carcinoma right upper lobe of lung  History of ductal carcinoma in situ right breast Mixed hyperlipidemia Coronary artery disease History of bilateral carotid artery stenoses Cervical spondylosis with radiculopathy   Discharge Diagnoses:   Clinical stage 1B (T2, N0) squamous cell carcinoma right lung History of ductal carcinoma in situ right breast Mixed hyperlipidemia Coronary artery disease History of bilateral carotid artery stenoses Cervical spondylosis with radiculopathy S/P lobectomy of lung S/P robot-assisted surgical procedure   Discharged Condition: good  HPI: This is a is a 66 year old woman with a past medical history significant for tobacco abuse, COPD, CAD, MI, thoracic aortic atherosclerosis, hypertension, hyperlipidemia, reflux, anxiety, depression, rheumatoid arthritis, bladder mass, thyroid nodule, and DCIS of the breast.  She has a 45-pack-year history of smoking prior to quitting in 2017.  She had COVID earlier this year.  In February she presented with a 3-week cough.  A chest x-ray showed right upper lobe opacity.  That led to a CT of the chest in late March which showed a 1.9 x 1.4 x 2.5 cm spiculated nodule in anterior upper lobe.  A PET/CT was then done about a month later which showed the nodule measured 2.2 x 1.9 cm and was markedly hypermetabolic with an SUV of 16.4.  There was mild uptake in a paratracheal node which was indeterminate.  There was some uptake in the right lateral seventh rib in the area of a fracture.  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  She then had an endobronchial ultrasound to evaluate the right paratracheal node.  Aspirations showed lymphoid cells but no evidence of metastasis.    She gets short of breath with exertion but can walk up a flight of stairs without stopping.  She recently has been having a burning sensation in her neck.  This is not associated with eating or exertion.  She says it started after she began using her Breztri inhaler.  She denies any change in appetite or weight loss.  No chest pain, pressure, or tightness.   She had a cardiology work up with Dr. Gwen Pounds. She was cleared to undergo thoracic surgery. Dr.Leiby Pigeon discussed the need for robotic assisted right VATS, RUL, possible chest wall resection, possible conversion to a right thoracotomy. Potential risks, benefits, and complications of the surgery were discussed with the patient and she agreed to proceed with surgery. Of note, she had a repeat CT of the chest done on 06/30 which showed no evidence of metastatic disease,a spiculated nodule in the anterior RUL, and no changed in the 8 mm right paratracheal mediastinal lymph node.   Hospital Course: She underwent a robotic assisted right VATS, RUL, LN dissection, and intercostal nerve block. She was transported from the OR to PACU in stable condition.  She was extubated routinely and later transferred to [facility].  By the morning of the first postoperative day, she was maintaining adequate oxygen saturation on room air.  She had some pain in her right shoulder but this was well controlled with the analgesics made available to her.  There was a small air leak.  Chest tube was left to waterseal.  Chest x-ray was satisfactory with expected postoperative changes.  She was started on enoxaparin for DVT prophylaxis per protocol.  She was mobilized.  Diet was advanced and well-tolerated.  Her air leak resolved and her chest tube was removed on 03/20/2022.  Follow up CXR showed an expected air fluid level post removal of a lobe.  There was also persistent atelectasis.  She was started on Mucinex for congestion.  She was also treated with a 1 time dose of  Mucomyst  She was weaning off oxygen as tolerated.  Her surgical incisions are healing without evidence of infection.  She is ambulating without difficulty.  She is medically stable for discharge home today.  Consults: None  Significant Diagnostic Studies:   nuclear medicine:   FINDINGS: Mediastinal blood pool activity: SUV max 3.0   Liver activity: SUV max NA   NECK: No hypermetabolic cervical lymphadenopathy.   Incidental CT findings: none   CHEST: 2.2 x 1.9 cm spiculated nodule in the anterior right upper lobe (series 7/image 27), abutting the right anterior chest wall, max SUV 16.4. This appearance favors primary bronchogenic neoplasm over metastasis.   Bandlike patchy opacity in the anterior left upper lobe (series 7/image 37), max SUV 3.7, new from recent prior and favored to be infectious/inflammatory.   Small mediastinal nodes, including a dominant 12 mm short axis low right paratracheal node (series 4/image 60), max SUV 4.5.   Postprocedural changes in the lateral right breast with associated 6.4 x 4.5 cm seroma (series 4/image 72), non FDG avid.   Incidental CT findings: Atherosclerotic calcifications of the aortic arch. Three vessel coronary atherosclerosis.   ABDOMEN/PELVIS: No abnormal hypermetabolism in the liver, spleen, pancreas, or adrenal glands.   No hypermetabolic abdominopelvic lymphadenopathy.   Incidental CT findings: 2.3 cm right adrenal adenoma. Atherosclerotic calcifications of the abdominal aorta and branch vessels. Status post hysterectomy.   SKELETON: Destructive changes involving the right lateral 7th rib (series 4/image 82), max SUV 4.7, worrisome for pathologic fracture.   Incidental CT findings: Cervical spine fixation hardware. Lumbar spine fixation hardware. Mild degenerative changes of the visualized thoracolumbar spine.   IMPRESSION: 2.2 cm spiculated nodule in the anterior right upper lobe, favoring primary bronchogenic  neoplasm over metastasis.   Dominant 12 mm short axis low right paratracheal node, indeterminate. Attention on follow-up is suggested.   Destructive changes involving the right lateral 7th rib, worrisome for pathologic fracture.   Bandlike opacity in the anterior left upper lobe, favored to be infectious/inflammatory.   Postoperative seroma in the right breast, non FDG avid.   Benign right adrenal adenoma.     Electronically Signed   By: Charline Bills M.D.   On: 01/07/2022 00:52    Treatments: Surgery Operative Report    DATE OF PROCEDURE: 03/17/2022   PREOPERATIVE DIAGNOSIS:  Squamous cell carcinoma, right upper lobe, clinical stage IB (T2, N0).   POSTOPERATIVE DIAGNOSIS:  Squamous cell carcinoma, right upper lobe, clinical stage IB (T2, N0).   PROCEDURE:  Xi robotic-assisted extrapleural right upper lobectomy, lymph node dissection and intercostal nerve blocks levels 3 through 10.   SURGEON:  Salvatore Decent. Dorris Fetch, MD   ASSISTANT:  Jillyn Hidden, PA and Doree Fudge, [place]   ANESTHESIA:  General.   FINDINGS:  Adhesions in the area of tumor resected with extrapleural plane. Margins in that area negative for tumor.  Biopsies from the area of seventh rib fracture negative for tumor.  Enlarged 4R and level 10 lymph nodes negative for tumor. Bronchial  margin negative.  Discharge Exam: Blood pressure (!) 145/65, pulse 85, temperature 97.8 F (36.6 C), temperature source Oral, resp. rate (!) 25, height 5' (  1.524 m), weight 62.9 kg, SpO2 94 %.  General appearance: alert, cooperative, and no distress Heart: regular rate and rhythm Lungs: clear to auscultation bilaterally Abdomen: soft, non-tender; bowel sounds normal; no masses,  no organomegaly Extremities: extremities normal, atraumatic, no cyanosis or edema Wound: clean and dry  Discharge disposition: 01-Home or Self Care   Allergies as of 03/22/2022       Reactions   Other    BANDAIDS-OF LEFT ON FOR  AN EXTENDED PERIOD OF TIME Patient states latex rubber gloves are ok        Medication List     STOP taking these medications    methotrexate 2.5 MG tablet Commonly known as: RHEUMATREX   traMADol 50 MG tablet Commonly known as: ULTRAM       TAKE these medications    albuterol 108 (90 Base) MCG/ACT inhaler Commonly known as: VENTOLIN HFA Inhale 1-2 puffs into the lungs every 6 (six) hours as needed for wheezing or shortness of breath.   alendronate 70 MG tablet Commonly known as: Fosamax Take 1 tablet (70 mg total) by mouth once a week. Take with a full glass of water on an empty stomach. Stand or sit upright for 1 hour after taking What changed: when to take this   ALPRAZolam 0.25 MG tablet Commonly known as: XANAX Take 1 tablet (0.25 mg total) by mouth 2 (two) times daily as needed for anxiety. What changed: when to take this   anastrozole 1 MG tablet Commonly known as: ARIMIDEX TAKE (1) TABLET BY MOUTH EVERY DAY What changed: See the new instructions.   aspirin-acetaminophen-caffeine 250-250-65 MG tablet Commonly known as: EXCEDRIN MIGRAINE Take by mouth every 6 (six) hours as needed for headache.   Breztri Aerosphere 160-9-4.8 MCG/ACT Aero Generic drug: Budeson-Glycopyrrol-Formoterol Inhale 2 puffs into the lungs in the morning and at bedtime.   fluticasone 50 MCG/ACT nasal spray Commonly known as: FLONASE Place 2 sprays into both nostrils daily as needed for allergies.   folic acid 1 MG tablet Commonly known as: FOLVITE Take 2 mg by mouth in the morning.   furosemide 20 MG tablet Commonly known as: Lasix Take 1 tablet (20 mg total) by mouth daily. Start taking on: March 23, 2022   gabapentin 100 MG capsule Commonly known as: Neurontin Take 1 capsule (100 mg total) by mouth 2 (two) times daily.   guaiFENesin 600 MG 12 hr tablet Commonly known as: MUCINEX Take 2 tablets (1,200 mg total) by mouth 2 (two) times daily.   montelukast 10 MG  tablet Commonly known as: SINGULAIR Take 1 tablet (10 mg total) by mouth at bedtime.   omeprazole 40 MG capsule Commonly known as: PRILOSEC Take 1 capsule (40 mg total) by mouth daily.   potassium chloride 10 MEQ tablet Commonly known as: KLOR-CON TAKE (1) TABLET BY MOUTH EVERY DAY   potassium chloride 10 MEQ tablet Commonly known as: KLOR-CON M Take 1 tablet (10 mEq total) by mouth daily. Start taking on: March 23, 2022   rosuvastatin 40 MG tablet Commonly known as: CRESTOR Take 40 mg by mouth every evening.   sertraline 100 MG tablet Commonly known as: ZOLOFT Take 1 tablet (100 mg total) by mouth daily.   triamcinolone cream 0.1 % Commonly known as: KENALOG Apply 1 application. topically 2 (two) times daily. To affected area.               Durable Medical Equipment  (From admission, onward)  Start     Ordered   03/21/22 1023  For home use only DME oxygen  Once       Question Answer Comment  Length of Need 6 Months   Mode or (Route) Nasal cannula   Liters per Minute 2   Frequency Continuous (stationary and portable oxygen unit needed)   Oxygen delivery system Gas      03/21/22 1022            Follow-up Information     Lyndon Code, MD Follow up.   Specialties: Internal Medicine, Cardiology Why: Please schedule appointment once discharged for follow up on elevated blood pressure during your hopsitalization Contact information: [address] [place] [address] [phone]         Loreli Slot, MD Follow up on 04/05/2022.   Specialty: Cardiothoracic Surgery Why: Appointment is at 12:30, please get CXR at 12:00 at [facility] located on first floor of our office building Contact information: [address] [place] [address] [phone]                 Signed:  Lowella Dandy, PA-C 03/22/2022, 7:52 AM

## 2022-03-18 NOTE — Progress Notes (Signed)
1 Day Post-Op Procedure(s) (LRB): XI ROBOTIC ASSISTED THORASCOPY-RIGHT UPPER LOBECTOMY (Right) LYMPH NODE DISSECTION (Right) INTERCOSTAL NERVE BLOCK (Right) Subjective: Some pain right shoulder, no nausea  Objective: Vital signs in last 24 hours: Temp:  [97.7 F (36.5 C)-98.4 F (36.9 C)] 98.4 F (36.9 C) (07/07 0334) Pulse Rate:  [63-79] 64 (07/07 0334) Cardiac Rhythm: Normal sinus rhythm (07/07 0729) Resp:  [14-20] 16 (07/07 0334) BP: (112-164)/(53-76) 129/53 (07/07 0334) SpO2:  [91 %-99 %] 98 % (07/07 0740) Arterial Line BP: (137-161)/(54-70) 137/54 (07/06 1935) Weight:  [03 kg] 64 kg (07/06 1000)  Hemodynamic parameters for last 24 hours:    Intake/Output from previous day: 07/06 0701 - 07/07 0700 In: 3382.7 [P.O.:480; I.V.:2202.7; IV Piggyback:700] Out: 1590 [Urine:1190; Blood:150; Chest Tube:250] Intake/Output this shift: No intake/output data recorded.  General appearance: alert, cooperative, and no distress Neurologic: intact Heart: regular rate and rhythm Lungs: diminished breath sounds on right Abdomen: normal findings: soft, non-tender + air leak  Lab Results: No results for input(s): "WBC", "HGB", "HCT", "PLT" in the last 72 hours. BMET:  Recent Labs    03/18/22 0320  NA 140  K 4.2  CL 106  CO2 24  GLUCOSE 148*  BUN 8  CREATININE 0.58  CALCIUM 8.2*    PT/INR: No results for input(s): "LABPROT", "INR" in the last 72 hours. ABG    Component Value Date/Time   PHART 7.47 (H) 03/17/2022 1107   HCO3 28.4 (H) 03/17/2022 1107   TCO2 24 08/07/2019 0802   O2SAT 94.9 03/17/2022 1107   CBG (last 3)  No results for input(s): "GLUCAP" in the last 72 hours.  Assessment/Plan: S/P Procedure(s) (LRB): XI ROBOTIC ASSISTED THORASCOPY-RIGHT UPPER LOBECTOMY (Right) LYMPH NODE DISSECTION (Right) INTERCOSTAL NERVE BLOCK (Right) POD # 1, looks great Pain reasonably well controlled, right shoulder pain from chest tube Advance diet, IV to [place] [facility]  CT to water seal SCD + enoxaparin Ambulate Wean O2  LOS: 1 day    Melrose Nakayama 03/18/2022

## 2022-03-19 ENCOUNTER — Inpatient Hospital Stay (HOSPITAL_COMMUNITY): Payer: Medicare HMO

## 2022-03-19 LAB — CBC
HCT: 35.1 % — ABNORMAL LOW (ref 36.0–46.0)
Hemoglobin: 10.9 g/dL — ABNORMAL LOW (ref 12.0–15.0)
MCH: 29.6 pg (ref 26.0–34.0)
MCHC: 31.1 g/dL (ref 30.0–36.0)
MCV: 95.4 fL (ref 80.0–100.0)
Platelets: 161 10*3/uL (ref 150–400)
RBC: 3.68 MIL/uL — ABNORMAL LOW (ref 3.87–5.11)
RDW: 14 % (ref 11.5–15.5)
WBC: 10 10*3/uL (ref 4.0–10.5)
nRBC: 0 % (ref 0.0–0.2)

## 2022-03-19 LAB — COMPREHENSIVE METABOLIC PANEL
ALT: 15 U/L (ref 0–44)
AST: 19 U/L (ref 15–41)
Albumin: 2.9 g/dL — ABNORMAL LOW (ref 3.5–5.0)
Alkaline Phosphatase: 57 U/L (ref 38–126)
Anion gap: 8 (ref 5–15)
BUN: 9 mg/dL (ref 8–23)
CO2: 26 mmol/L (ref 22–32)
Calcium: 8 mg/dL — ABNORMAL LOW (ref 8.9–10.3)
Chloride: 107 mmol/L (ref 98–111)
Creatinine, Ser: 0.55 mg/dL (ref 0.44–1.00)
GFR, Estimated: >60 mL/min (ref >60)
Glucose, Bld: 127 mg/dL — ABNORMAL HIGH (ref 70–99)
Potassium: 3.7 mmol/L (ref 3.5–5.1)
Sodium: 141 mmol/L (ref 135–145)
Total Bilirubin: 0.4 mg/dL (ref 0.3–1.2)
Total Protein: 5.4 g/dL — ABNORMAL LOW (ref 6.5–8.1)

## 2022-03-19 NOTE — Progress Notes (Signed)
Pt slightly hypertensive at 1600 vitals check, otherwise asymptomatic. Pt requesting trip to bathroom. Will reassess vitals after daily cares.   1800 - pt remains HTN but otherwise asymptomatic and stable. Repositioned pt. Pt endorsing spasms in right to mid upper back. Erin PA paged [phone]

## 2022-03-19 NOTE — Plan of Care (Signed)
Problem: Education: Goal: Knowledge of General Education information will improve Description: Including pain rating scale, medication(s)/side effects and non-pharmacologic comfort measures 03/19/2022 1126 by Leonie Man, RN Outcome: Progressing 03/19/2022 1126 by Leonie Man, RN Outcome: Progressing   Problem: Health Behavior/Discharge Planning: Goal: Ability to manage health-related needs will improve 03/19/2022 1126 by Leonie Man, RN Outcome: Progressing 03/19/2022 1126 by Leonie Man, RN Outcome: Progressing   Problem: Clinical Measurements: Goal: Ability to maintain clinical measurements within normal limits will improve 03/19/2022 1126 by Leonie Man, RN Outcome: Progressing 03/19/2022 1126 by Leonie Man, RN Outcome: Progressing Goal: Will remain free from infection 03/19/2022 1126 by Leonie Man, RN Outcome: Progressing 03/19/2022 1126 by Leonie Man, RN Outcome: Progressing Goal: Diagnostic test results will improve 03/19/2022 1126 by Leonie Man, RN Outcome: Progressing 03/19/2022 1126 by Leonie Man, RN Outcome: Progressing Goal: Respiratory complications will improve 03/19/2022 1126 by Leonie Man, RN Outcome: Progressing 03/19/2022 1126 by Leonie Man, RN Outcome: Progressing Goal: Cardiovascular complication will be avoided 03/19/2022 1126 by Leonie Man, RN Outcome: Progressing 03/19/2022 1126 by Leonie Man, RN Outcome: Progressing   Problem: Activity: Goal: Risk for activity intolerance will decrease 03/19/2022 1126 by Leonie Man, RN Outcome: Progressing 03/19/2022 1126 by Leonie Man, RN Outcome: Progressing   Problem: Nutrition: Goal: Adequate nutrition will be maintained 03/19/2022 1126 by Leonie Man, RN Outcome: Progressing 03/19/2022 1126 by Leonie Man, RN Outcome: Progressing   Problem: Coping: Goal: Level of anxiety will decrease 03/19/2022 1126 by Leonie Man, RN Outcome: Progressing 03/19/2022 1126 by Leonie Man,  RN Outcome: Progressing   Problem: Elimination: Goal: Will not experience complications related to bowel motility 03/19/2022 1126 by Leonie Man, RN Outcome: Progressing 03/19/2022 1126 by Leonie Man, RN Outcome: Progressing Goal: Will not experience complications related to urinary retention 03/19/2022 1126 by Leonie Man, RN Outcome: Progressing 03/19/2022 1126 by Leonie Man, RN Outcome: Progressing   Problem: Pain Managment: Goal: General experience of comfort will improve 03/19/2022 1126 by Leonie Man, RN Outcome: Progressing 03/19/2022 1126 by Leonie Man, RN Outcome: Progressing   Problem: Safety: Goal: Ability to remain free from injury will improve 03/19/2022 1126 by Leonie Man, RN Outcome: Progressing 03/19/2022 1126 by Leonie Man, RN Outcome: Progressing   Problem: Skin Integrity: Goal: Risk for impaired skin integrity will decrease 03/19/2022 1126 by Leonie Man, RN Outcome: Progressing 03/19/2022 1126 by Leonie Man, RN Outcome: Progressing   Problem: Education: Goal: Knowledge of disease or condition will improve 03/19/2022 1126 by Leonie Man, RN Outcome: Progressing 03/19/2022 1126 by Leonie Man, RN Outcome: Progressing Goal: Knowledge of the prescribed therapeutic regimen will improve 03/19/2022 1126 by Leonie Man, RN Outcome: Progressing 03/19/2022 1126 by Leonie Man, RN Outcome: Progressing   Problem: Activity: Goal: Risk for activity intolerance will decrease 03/19/2022 1126 by Leonie Man, RN Outcome: Progressing 03/19/2022 1126 by Leonie Man, RN Outcome: Progressing   Problem: Cardiac: Goal: Will achieve and/or maintain hemodynamic stability 03/19/2022 1126 by Leonie Man, RN Outcome: Progressing 03/19/2022 1126 by Leonie Man, RN Outcome: Progressing   Problem: Clinical Measurements: Goal: Postoperative complications will be avoided or minimized 03/19/2022 1126 by Leonie Man, RN Outcome: Progressing 03/19/2022 1126 by  Leonie Man, RN Outcome: Progressing   Problem: Respiratory: Goal: Respiratory status will improve 03/19/2022 1126 by Leonie Man, RN Outcome:  Progressing 03/19/2022 1126 by Evelene Croon D, RN Outcome: Progressing   Problem: Pain Management: Goal: Pain level will decrease Outcome: Progressing   Problem: Skin Integrity: Goal: Wound healing without signs and symptoms infection will improve Outcome: Progressing

## 2022-03-19 NOTE — Progress Notes (Addendum)
      [address]       [place] [address]             [phone]      2 Days Post-Op Procedure(s) (LRB): XI ROBOTIC ASSISTED THORASCOPY-RIGHT UPPER LOBECTOMY (Right) LYMPH NODE DISSECTION (Right) INTERCOSTAL NERVE BLOCK (Right)  Subjective:  Patient has some soreness at chest tube site.  Denies N/V.  She was hoping to go home today.  Only walked once yesterday, wanting to do more,  No BM yet, passing gas  Objective: Vital signs in last 24 hours: Temp:  [97.8 F (36.6 C)-98.4 F (36.9 C)] 97.8 F (36.6 C) (07/08 0756) Pulse Rate:  [64-106] 66 (07/08 0756) Cardiac Rhythm: Normal sinus rhythm (07/07 2000) Resp:  [14-26] 20 (07/08 0756) BP: (123-152)/(44-65) 134/58 (07/08 0756) SpO2:  [93 %-99 %] 96 % (07/08 0826)  Intake/Output from previous day: 07/07 0701 - 07/08 0700 In: 1451.4 [P.O.:960; I.V.:491.4] Out: 1005 [Urine:645; Chest Tube:360] Intake/Output this shift: Total I/O In: -  Out: 150 [Chest Tube:150]  General appearance: alert, cooperative, and no distress Heart: regular rate and rhythm Lungs: clear to auscultation bilaterally Abdomen: soft, non-tender; bowel sounds normal; no masses,  no organomegaly Extremities: extremities normal, atraumatic, no cyanosis or edema Wound: clean and dry  Lab Results: Recent Labs    03/18/22 0320 03/19/22 0304  WBC 12.7* 10.0  HGB 11.9* 10.9*  HCT 39.0 35.1*  PLT 158 161   BMET:  Recent Labs    03/18/22 0320 03/19/22 0304  NA 140 141  K 4.2 3.7  CL 106 107  CO2 24 26  GLUCOSE 148* 127*  BUN 8 9  CREATININE 0.58 0.55  CALCIUM 8.2* 8.0*    PT/INR: No results for input(s): "LABPROT", "INR" in the last 72 hours. ABG    Component Value Date/Time   PHART 7.47 (H) 03/17/2022 1107   HCO3 28.4 (H) 03/17/2022 1107   TCO2 24 08/07/2019 0802   O2SAT 94.9 03/17/2022 1107   CBG (last 3)  No results for input(s): "GLUCAP" in the last 72 hours.  Assessment/Plan: S/P Procedure(s) (LRB): XI  ROBOTIC ASSISTED THORASCOPY-RIGHT UPPER LOBECTOMY (Right) LYMPH NODE DISSECTION (Right) INTERCOSTAL NERVE BLOCK (Right)  CV- hemodynamically stable in NSR, some mild HTN will monitor Pulm-  CT on water seal, tiny air leak, 360 cc output- will leave chest tube in place today, encouraged use of IS Renal- creatinine remains WNL Lovenox for DVT prophylaxis Dispo- patient stable, tiny air leak from chest tube on water seal, 360 cc output leave in place today, patient wanting to ambulate more, have spoken to nursing, can hopefully remove CT tomorrow   LOS: 2 days   Ellwood Handler, PA-C 03/19/2022 Patient seen and examined, agree with above Doing well but small intermittent air leak, keep tube one more day  Remo Lipps C. [name], MD [facility] [phone]

## 2022-03-20 ENCOUNTER — Inpatient Hospital Stay (HOSPITAL_COMMUNITY): Payer: Medicare HMO

## 2022-03-20 NOTE — Progress Notes (Signed)
3 Days Post-Op Procedure(s) (LRB): XI ROBOTIC ASSISTED THORASCOPY-RIGHT UPPER LOBECTOMY (Right) LYMPH NODE DISSECTION (Right) INTERCOSTAL NERVE BLOCK (Right) Subjective: Some pain from CT, no dyspnea  Objective: Vital signs in last 24 hours: Temp:  [97.9 F (36.6 C)-98.5 F (36.9 C)] 98.2 F (36.8 C) (07/09 0750) Pulse Rate:  [72-82] 77 (07/09 0750) Cardiac Rhythm: Normal sinus rhythm (07/09 0712) Resp:  [15-21] 20 (07/09 0750) BP: (137-175)/(54-75) 140/63 (07/09 0750) SpO2:  [91 %-93 %] 93 % (07/09 0750)  Hemodynamic parameters for last 24 hours:    Intake/Output from previous day: 07/08 0701 - 07/09 0700 In: -  Out: 540 [Chest Tube:540] Intake/Output this shift: Total I/O In: 100 [P.O.:100] Out: -   General appearance: alert, cooperative, and no distress Neurologic: intact Heart: regular rate and rhythm Lungs: diminished breath sounds right base Wound: clean and dry No air leak  Lab Results: Recent Labs    03/18/22 0320 03/19/22 0304  WBC 12.7* 10.0  HGB 11.9* 10.9*  HCT 39.0 35.1*  PLT 158 161   BMET:  Recent Labs    03/18/22 0320 03/19/22 0304  NA 140 141  K 4.2 3.7  CL 106 107  CO2 24 26  GLUCOSE 148* 127*  BUN 8 9  CREATININE 0.58 0.55  CALCIUM 8.2* 8.0*    PT/INR: No results for input(s): "LABPROT", "INR" in the last 72 hours. ABG    Component Value Date/Time   PHART 7.47 (H) 03/17/2022 1107   HCO3 28.4 (H) 03/17/2022 1107   TCO2 24 08/07/2019 0802   O2SAT 94.9 03/17/2022 1107   CBG (last 3)  No results for input(s): "GLUCAP" in the last 72 hours.  Assessment/Plan: S/P Procedure(s) (LRB): XI ROBOTIC ASSISTED THORASCOPY-RIGHT UPPER LOBECTOMY (Right) LYMPH NODE DISSECTION (Right) INTERCOSTAL NERVE BLOCK (Right) POD # 3 Doing well No air leak- dc chest tube Wean O2 Ambulate SCD + enoxaparin for DVT prophylaxis Possibly home tomorrow  LOS: 3 days    Melrose Nakayama 03/20/2022

## 2022-03-20 NOTE — Plan of Care (Signed)

## 2022-03-21 ENCOUNTER — Inpatient Hospital Stay (HOSPITAL_COMMUNITY): Payer: Medicare HMO

## 2022-03-21 MED ORDER — GUAIFENESIN ER 600 MG PO TB12
1200.0000 mg | ORAL_TABLET | Freq: Two times a day (BID) | ORAL | Status: DC
  Administered 2022-03-21 – 2022-03-22 (×3): 1200 mg via ORAL
  Filled 2022-03-21 (×3): qty 2

## 2022-03-21 MED ORDER — ACETYLCYSTEINE 20 % IN SOLN
3.0000 mL | Freq: Once | RESPIRATORY_TRACT | Status: AC
Start: 2022-03-21 — End: 2022-03-21
  Administered 2022-03-21: 3 mL via RESPIRATORY_TRACT
  Filled 2022-03-21 (×2): qty 4

## 2022-03-21 MED ORDER — FUROSEMIDE 10 MG/ML IJ SOLN
40.0000 mg | Freq: Once | INTRAMUSCULAR | Status: AC
  Administered 2022-03-21: 40 mg via INTRAVENOUS
  Filled 2022-03-21: qty 4

## 2022-03-21 MED ORDER — POTASSIUM CHLORIDE CRYS ER 20 MEQ PO TBCR
20.0000 meq | EXTENDED_RELEASE_TABLET | Freq: Every day | ORAL | Status: DC
  Administered 2022-03-21 – 2022-03-22 (×2): 20 meq via ORAL
  Filled 2022-03-21 (×2): qty 1

## 2022-03-21 NOTE — Care Management Important Message (Signed)
Important Message  Patient Details  Name: Michelle Moore MRN: [id number] Date of Birth: [birth year]   Medicare Important Message Given:  Yes     Orbie Pyo 03/21/2022, 3:14 PM

## 2022-03-21 NOTE — Progress Notes (Signed)
Mobility Specialist Progress Note    03/21/22 1031  Mobility  Activity Ambulated independently in hallway  Level of Assistance Standby assist, set-up cues, supervision of patient - no hands on  Assistive Device None  Distance Ambulated (ft) 420 ft  Activity Response Tolerated well  $Mobility charge 1 Mobility   Pre-Mobility: 87 HR, 89% on RA SpO2 During Mobility: 103 HR, 19% on 4LO2 SpO2 Post-Mobility: 90 HR, 88% on 2LO2 SpO2  Pt received in bed and agreeable. Dropped as low 81% on 3LO2 with no complaints. Maintained SpO2 at 91% on 4LO2. Encouraged pursed lip breathing. Returned to room for void then bed. RN notified.   Hildred Alamin Mobility Specialist

## 2022-03-21 NOTE — Care Management Important Message (Signed)
Important Message  Patient Details  Name: Michelle Moore MRN: [id number] Date of Birth: 1956/05/07   Medicare Important Message Given:  Yes     Orbie Pyo 03/21/2022, 3:16 PM

## 2022-03-21 NOTE — Progress Notes (Addendum)
      [address]       [place] [address]             [phone]      4 Days Post-Op Procedure(s) (LRB): XI ROBOTIC ASSISTED THORASCOPY-RIGHT UPPER LOBECTOMY (Right) LYMPH NODE DISSECTION (Right) INTERCOSTAL NERVE BLOCK (Right)  Subjective:  No new complaints.  Continues to struggle coughing up sputum.  Unable to wean off oxygen.  Wants to go home.  Objective: Vital signs in last 24 hours: Temp:  [97.6 F (36.4 C)-98.2 F (36.8 C)] 98.1 F (36.7 C) (07/10 0510) Pulse Rate:  [75-87] 75 (07/10 0510) Cardiac Rhythm: Normal sinus rhythm (07/09 1929) Resp:  [18-23] 18 (07/10 0510) BP: (135-154)/(52-70) 135/70 (07/10 0510) SpO2:  [89 %-96 %] 96 % (07/10 0510)  Intake/Output from previous day: 07/09 0701 - 07/10 0700 In: 100 [P.O.:100] Out: -   General appearance: alert, cooperative, and no distress Heart: regular rate and rhythm Lungs: diminished breath sounds on right Abdomen: soft, non-tender; bowel sounds normal; no masses,  no organomegaly Extremities: extremities normal, atraumatic, no cyanosis or edema Wound: clean and dry  Lab Results: Recent Labs    03/19/22 0304  WBC 10.0  HGB 10.9*  HCT 35.1*  PLT 161   BMET:  Recent Labs    03/19/22 0304  NA 141  K 3.7  CL 107  CO2 26  GLUCOSE 127*  BUN 9  CREATININE 0.55  CALCIUM 8.0*    PT/INR: No results for input(s): "LABPROT", "INR" in the last 72 hours. ABG    Component Value Date/Time   PHART 7.47 (H) 03/17/2022 1107   HCO3 28.4 (H) 03/17/2022 1107   TCO2 24 08/07/2019 0802   O2SAT 94.9 03/17/2022 1107   CBG (last 3)  No results for input(s): "GLUCAP" in the last 72 hours.  Assessment/Plan: S/P Procedure(s) (LRB): XI ROBOTIC ASSISTED THORASCOPY-RIGHT UPPER LOBECTOMY (Right) LYMPH NODE DISSECTION (Right) INTERCOSTAL NERVE BLOCK (Right)  CV- stable in NSR, borderline HTN- will need to follow up with PCP Pulm- unable to wean oxygen, on 3L this morning. CXR w/o pneumothorax,  post chest tube removal... worsening atelectasis.Marland Kitchen will add Mucinex for her cough, give a round of Mucomyst Lovenox for DVT prophylaxis Dispo- patient stable, will need to obtain walk test as patient has been unable to wean from oxygen, will give Mucinex for congestion, give a 1 time neb of Mucomyst, encouraged continued IS... patient wishes to go home, ideally she needs to be able to maintain saturations at 2L prior to being discharged   LOS: 4 days    Ellwood Handler, PA-C 03/21/2022  Patient seen and examined, agree with above She is still on O2 and is fluid overloaded- will diurese this AM Probably home tomorrow  Remo Lipps C. [name], MD [facility] [phone]

## 2022-03-21 NOTE — Plan of Care (Signed)
  Problem: Clinical Measurements: Goal: Cardiovascular complication will be avoided Outcome: Progressing   Problem: Activity: Goal: Risk for activity intolerance will decrease Outcome: Progressing   Problem: Coping: Goal: Level of anxiety will decrease Outcome: Progressing   Problem: Elimination: Goal: Will not experience complications related to bowel motility Outcome: Progressing Goal: Will not experience complications related to urinary retention Outcome: Progressing   Problem: Pain Managment: Goal: General experience of comfort will improve Outcome: Progressing   Problem: Clinical Measurements: Goal: Respiratory complications will improve Outcome: Not Progressing

## 2022-03-21 NOTE — Progress Notes (Signed)
SATURATION QUALIFICATIONS: (This note is used to comply with regulatory documentation for home oxygen)  Patient Saturations on Room Air at Rest = 89%  Patient Saturations on Room Air while Ambulating = 84%  Patient Saturations on 4 Liters of oxygen while Ambulating = 91%

## 2022-03-21 NOTE — Progress Notes (Signed)
SATURATION QUALIFICATIONS: (This note is used to comply with regulatory documentation for home oxygen)  Patient Saturations on Room Air at Rest = 89%  Patient Saturations on Room Air while Ambulating = 80%  Patient Saturations on 1 Liters of oxygen while Ambulating = 87%  Please briefly explain why patient needs home oxygen: Pt desats into upper 70s/low 80s w/o O2 during moderate activity

## 2022-03-22 ENCOUNTER — Inpatient Hospital Stay (HOSPITAL_COMMUNITY): Payer: Medicare HMO

## 2022-03-22 ENCOUNTER — Other Ambulatory Visit (HOSPITAL_COMMUNITY): Payer: Self-pay

## 2022-03-22 LAB — TYPE AND SCREEN
ABO/RH(D): O POS
Antibody Screen: NEGATIVE
Unit division: 0
Unit division: 0
Unit division: 0
Unit division: 0

## 2022-03-22 LAB — BPAM RBC
Blood Product Expiration Date: 202308072359
Blood Product Expiration Date: 202308072359
Blood Product Expiration Date: 202308072359
Blood Product Expiration Date: 202308072359
Unit Type and Rh: 5100
Unit Type and Rh: 5100
Unit Type and Rh: 5100
Unit Type and Rh: 5100

## 2022-03-22 MED ORDER — FUROSEMIDE 20 MG PO TABS: 20.0000 mg | ORAL_TABLET | Freq: Every day | ORAL | 0 refills | Status: DC

## 2022-03-22 MED ORDER — GUAIFENESIN ER 600 MG PO TB12: 1200.0000 mg | ORAL_TABLET | Freq: Two times a day (BID) | ORAL | Status: DC

## 2022-03-22 MED ORDER — FUROSEMIDE 10 MG/ML IJ SOLN
40.0000 mg | Freq: Once | INTRAMUSCULAR | Status: AC
  Administered 2022-03-22: 40 mg via INTRAVENOUS
  Filled 2022-03-22: qty 4

## 2022-03-22 MED ORDER — POTASSIUM CHLORIDE CRYS ER 10 MEQ PO TBCR: 10.0000 meq | EXTENDED_RELEASE_TABLET | Freq: Every day | ORAL | 0 refills | Status: DC

## 2022-03-22 MED ORDER — FUROSEMIDE 20 MG PO TABS
20.0000 mg | ORAL_TABLET | Freq: Every day | ORAL | 0 refills | Status: DC
  Filled 2022-03-22: qty 5, 5d supply, fill #0

## 2022-03-22 MED ORDER — GUAIFENESIN ER 600 MG PO TB12
1200.0000 mg | ORAL_TABLET | Freq: Two times a day (BID) | ORAL | 0 refills | Status: DC
  Filled 2022-03-22: qty 20, 5d supply, fill #0

## 2022-03-22 MED ORDER — POTASSIUM CHLORIDE CRYS ER 10 MEQ PO TBCR
10.0000 meq | EXTENDED_RELEASE_TABLET | Freq: Every day | ORAL | 0 refills | Status: DC
Start: 2022-03-23 — End: 2022-04-22
  Filled 2022-03-22: qty 5, 5d supply, fill #0

## 2022-03-22 NOTE — Progress Notes (Signed)
   03/21/22 2348  Assess: MEWS Score  Temp 98.2 F (36.8 C)  BP (!) 154/77  MAP (mmHg) 97  Pulse Rate 79  ECG Heart Rate 79  Resp (!) 27  Level of Consciousness Alert  SpO2 90 %  O2 Device Nasal Cannula  Patient Activity (if Appropriate) In bed  O2 Flow Rate (L/min) 2 L/min  Assess: MEWS Score  MEWS Temp 0  MEWS Systolic 0  MEWS Pulse 0  MEWS RR 2  MEWS LOC 0  MEWS Score 2  MEWS Score Color Yellow  Assess: if the MEWS score is Yellow or Red  Were vital signs taken at a resting state? Yes  Focused Assessment Change from prior assessment (see assessment flowsheet)  Does the patient meet 2 or more of the SIRS criteria? Yes  Does the patient have a confirmed or suspected source of infection? No  MEWS guidelines implemented *See Row Information* Yes  Treat  Pain Scale 0-10  Pain Score Asleep  Assess: SIRS CRITERIA  SIRS Temperature  0  SIRS Pulse 0  SIRS Respirations  1  SIRS WBC 0  SIRS Score Sum  1

## 2022-03-22 NOTE — TOC Transition Note (Signed)
Transition of Care [facility]) - CM/SW Discharge Note   Patient Details  Name: Michelle Moore MRN: [id number] Date of Birth: 1955-10-17  [facility] CM/SW Contact:  Angelita Ingles, RN Phone Number:[phone]  03/22/2022, 12:23 PM   Clinical Narrative:    Patient discharging home. Home O2 referral called to Mardene Celeste at [employer]. CM at bedside to offer choice. Patient has no preference as long as services are covered by her insurance. Portable O2 to be delivered to the room Home Health referral has been accepted by [facility] Avs has been updated. No other needs noted at this time.      Barriers to Discharge: No Barriers Identified   Patient Goals and CMS Choice        Discharge Placement                       Discharge Plan and Services                DME Arranged: Oxygen DME Agency: [employer] Date DME Agency Contacted: 03/22/22 Time DME Agency Contacted: 1223 Representative spoke with at DME Agency: Dennard Schaumann Arranged: RN [place] Agency: [place] Date [place]: 03/22/22 Time [place]: 1223 Representative spoke with at [place]: [place] Determinants of Health ([place]) Interventions     Readmission Risk Interventions     No data to display

## 2022-03-22 NOTE — Progress Notes (Addendum)
      [address]       [place] [address]             [phone]      5 Days Post-Op Procedure(s) (LRB): XI ROBOTIC ASSISTED THORASCOPY-RIGHT UPPER LOBECTOMY (Right) LYMPH NODE DISSECTION (Right) INTERCOSTAL NERVE BLOCK (Right)  Subjective:  Patient looks better this morning.  She states she doesn't feel as winded getting up and moving around.  She was able to cough up some sputum after her breathing treatment yesterday.   Objective: Vital signs in last 24 hours: Temp:  [97.6 F (36.4 C)-98.5 F (36.9 C)] 97.8 F (36.6 C) (07/11 0442) Pulse Rate:  [70-100] 79 (07/10 2348) Cardiac Rhythm: Normal sinus rhythm (07/10 2017) Resp:  [17-27] 18 (07/11 0104) BP: (122-166)/(65-84) 148/65 (07/11 0442) SpO2:  [90 %-95 %] 90 % (07/10 2348) Weight:  [62.9 kg] 62.9 kg (07/10 0934)  General appearance: alert, cooperative, and no distress Heart: regular rate and rhythm Lungs: clear to auscultation bilaterally Abdomen: soft, non-tender; bowel sounds normal; no masses,  no organomegaly Extremities: extremities normal, atraumatic, no cyanosis or edema Wound: clean and dry  Lab Results: No results for input(s): "WBC", "HGB", "HCT", "PLT" in the last 72 hours. BMET: No results for input(s): "NA", "K", "CL", "CO2", "GLUCOSE", "BUN", "CREATININE", "CALCIUM" in the last 72 hours.  PT/INR: No results for input(s): "LABPROT", "INR" in the last 72 hours. ABG    Component Value Date/Time   PHART 7.47 (H) 03/17/2022 1107   HCO3 28.4 (H) 03/17/2022 1107   TCO2 24 08/07/2019 0802   O2SAT 94.9 03/17/2022 1107   CBG (last 3)  No results for input(s): "GLUCAP" in the last 72 hours.  Assessment/Plan: S/P Procedure(s) (LRB): XI ROBOTIC ASSISTED THORASCOPY-RIGHT UPPER LOBECTOMY (Right) LYMPH NODE DISSECTION (Right) INTERCOSTAL NERVE BLOCK (Right)  CV- NSR, HTN mild- BP has been running 130s-160s, will start HCTZ tomorrow after Lasix dose today, will need to follow up with  PCP, no previous diagnosis of HTN Pulm- hypoxia, due to recent lobectomy/atelectasis- has been weaned down to 2L via [place], home use has been arranged, continue aggressive IS, CXR improved, repeat Mucomyst today? Renal- weight is stable, patient states she feels less winded today, will repeat IV lasix this morning, continue potassium Lovenox for DVT prophylaxis Dispo- patient stable, CXR is improved this morning, patient feels like she is not as winded with activity, repeat diuretics, will discuss need for repeat mucomyst and dispo planning with Dr. [name]   LOS: 5 days   Ellwood Handler, PA-C 03/22/2022  Patient seen and examined, agree with above Will give IV Lasix again this AM Will dc home later today with home O2- should be short term Path still pending  Remo Lipps C. [name], MD [facility] [phone]

## 2022-03-23 ENCOUNTER — Telehealth: Payer: Self-pay

## 2022-03-23 NOTE — Telephone Encounter (Signed)
Patient will follow up with her other provider for hospital discharge-Toni

## 2022-03-24 ENCOUNTER — Other Ambulatory Visit: Payer: Medicare HMO

## 2022-03-24 DIAGNOSIS — Z902 Acquired absence of lung [part of]: Secondary | ICD-10-CM | POA: Diagnosis not present

## 2022-03-24 DIAGNOSIS — Z48813 Encounter for surgical aftercare following surgery on the respiratory system: Secondary | ICD-10-CM | POA: Diagnosis not present

## 2022-03-24 LAB — SURGICAL PATHOLOGY

## 2022-03-28 ENCOUNTER — Encounter: Payer: Self-pay | Admitting: Pulmonary Disease

## 2022-03-28 MED ORDER — BREZTRI AEROSPHERE 160-9-4.8 MCG/ACT IN AERO: 2.0000 | INHALATION_SPRAY | Freq: Two times a day (BID) | RESPIRATORY_TRACT | 11 refills | Status: DC

## 2022-03-28 NOTE — Telephone Encounter (Signed)
Michelle Moore has been sent to preferred pharmacy.  Spoke to patient via telephone and made her aware. Nothing further needed.

## 2022-03-28 NOTE — Telephone Encounter (Signed)
Patient called and needs prescription.  Please advise on below.

## 2022-03-30 ENCOUNTER — Inpatient Hospital Stay: Payer: Medicare HMO | Attending: Oncology | Admitting: Hospice and Palliative Medicine

## 2022-03-30 ENCOUNTER — Other Ambulatory Visit: Payer: Self-pay | Admitting: Surgical

## 2022-03-30 ENCOUNTER — Telehealth: Payer: Self-pay

## 2022-03-30 DIAGNOSIS — D0511 Intraductal carcinoma in situ of right breast: Secondary | ICD-10-CM | POA: Insufficient documentation

## 2022-03-30 DIAGNOSIS — Z923 Personal history of irradiation: Secondary | ICD-10-CM | POA: Insufficient documentation

## 2022-03-30 DIAGNOSIS — Z87891 Personal history of nicotine dependence: Secondary | ICD-10-CM | POA: Insufficient documentation

## 2022-03-30 DIAGNOSIS — Z79899 Other long term (current) drug therapy: Secondary | ICD-10-CM | POA: Insufficient documentation

## 2022-03-30 DIAGNOSIS — C3411 Malignant neoplasm of upper lobe, right bronchus or lung: Secondary | ICD-10-CM | POA: Insufficient documentation

## 2022-03-30 DIAGNOSIS — M8580 Other specified disorders of bone density and structure, unspecified site: Secondary | ICD-10-CM | POA: Insufficient documentation

## 2022-03-30 DIAGNOSIS — C779 Secondary and unspecified malignant neoplasm of lymph node, unspecified: Secondary | ICD-10-CM | POA: Insufficient documentation

## 2022-03-30 DIAGNOSIS — C3491 Malignant neoplasm of unspecified part of right bronchus or lung: Secondary | ICD-10-CM

## 2022-03-30 NOTE — Progress Notes (Signed)
Multidisciplinary Oncology Council Documentation  JENAVI BEEDLE was presented by our [facility] on 03/30/2022, which included representatives from:  Palliative Care Dietitian  Physical/Occupational Therapist Nurse Navigator Genetics Speech Therapist Social work Survivorship RN Financial Navigator Research RN   Avian currently presents with history of NSCLC  We reviewed previous medical and familial history, history of present illness, and recent lab results along with all available histopathologic and imaging studies. The [place] considered available treatment options and made the following recommendations/referrals:  SW, nutrition  The MOC is a meeting of clinicians from various specialty areas who evaluate and discuss patients for whom a multidisciplinary approach is being considered. Final determinations in the plan of care are those of the provider(s).   Today's extended care, comprehensive team conference, [place] was not present for the discussion and was not examined.

## 2022-03-30 NOTE — Telephone Encounter (Signed)
-----   Message from John Giovanni, [place] sent at 03/30/2022  4:17 PM EDT ----- Regarding: RE: pain med Contact: [phone] Somebody prescribed her #90 tabs of ultram on 03/14/22- that should be a 30 day supply. She must go to a pain clinic   Thanks  Patrick Jupiter   ----- Message ----- From: Marylen Ponto, LPN Sent: 5/83/4621   4:09 PM EDT To: John Giovanni, PA-C; Melrose Nakayama, MD Subject: pain med                                       Hi  Michelle Moore is calling for pain medication. She states that she was sent home with no pain meds.??? And the D/C summary reads to disconnected the  Tramadol. PLEASE ADVISE. W

## 2022-03-31 ENCOUNTER — Inpatient Hospital Stay: Payer: Medicare HMO | Admitting: Licensed Clinical Social Worker

## 2022-03-31 DIAGNOSIS — C3491 Malignant neoplasm of unspecified part of right bronchus or lung: Secondary | ICD-10-CM

## 2022-03-31 NOTE — Progress Notes (Signed)
[facility]  Initial Assessment   Michelle Moore is a 66 y.o. year old female contacted caregiver by phone. Clinical Social Work was referred by medical provider for assessment of psychosocial needs.   SDOH (Social Determinants of Health) assessments performed: Yes SDOH Interventions    Flowsheet Row Most Recent Value  SDOH Interventions   Food Insecurity Interventions Intervention Not Indicated  Financial Strain Interventions Intervention Not Indicated  Housing Interventions Intervention Not Indicated  Physical Activity Interventions Intervention Not Indicated  Stress Interventions Intervention Not Indicated  Social Connections Interventions Intervention Not Indicated  Transportation Interventions Patient Resources (Friends/Family), Intervention Not Indicated       SDOH Screenings   Alcohol Screen: Low Risk  (03/31/2022)   Alcohol Screen    Last Alcohol Screening Score (AUDIT): 1  Depression (PHQ2-9): Low Risk  (02/14/2022)   Depression (PHQ2-9)    PHQ-2 Score: 0  Financial Resource Strain: Low Risk  (03/31/2022)   Overall Financial Resource Strain (CARDIA)    Difficulty of Paying Living Expenses: Not very hard  Food Insecurity: No Food Insecurity (03/31/2022)   Hunger Vital Sign    Worried About Running Out of Food in the Last Year: Never true    Ran Out of Food in the Last Year: Never true  Housing: Low Risk  (03/31/2022)   Housing    Last Housing Risk Score: 0  Physical Activity: Inactive (03/31/2022)   Exercise Vital Sign    Days of Exercise per Week: 0 days    Minutes of Exercise per Session: 0 min  Social Connections: Socially Integrated (03/31/2022)   Social Connection and Isolation Panel [NHANES]    Frequency of Communication with Friends and Family: Three times a week    Frequency of Social Gatherings with Friends and Family: Three times a week    Attends Religious Services: More than 4 times per year    Active Member of Clubs or Organizations: Yes     Attends Archivist Meetings: More than 4 times per year    Marital Status: Married  Stress: No Stress Concern Present (03/31/2022)   [place]    Feeling of Stress : Only a little  Tobacco Use: Medium Risk (03/18/2022)   Patient History    Smoking Tobacco Use: Former    Smokeless Tobacco Use: Never    Passive Exposure: Not on file  Transportation Needs: No Transportation Needs (03/31/2022)   PRAPARE - Transportation    Lack of Transportation (Medical): No    Lack of Transportation (Non-Medical): No     Distress Screen completed: No    02/14/2018   11:15 AM  ONCBCN DISTRESS SCREENING  Screening Type Initial Screening  Distress experienced in past week (1-10) 0      Family/Social Information:  Housing Arrangement: patient lives with spouse Michelle, Moore [phone] Family members/support persons in your life? Family, Friends, Social worker, and [employer] concerns: no  Employment: Retired .[employer]  Income source: Paediatric nurse concerns: No Type of concern: None Food access concerns: no Religious or spiritual practice: Yes-. Services Currently in place:  [facility]  Coping/ Adjustment to diagnosis: Patient understands treatment plan and what happens next? yes Concerns about diagnosis and/or treatment: Pain or discomfort during procedures and I'm not especially worried about anything Patient reported stressors:  Patient denied stressors Hopes and/or priorities: N/A Patient enjoys gardening, watching TV, and time with family/ friends Current coping skills/ strengths: Ability for insight ,  Capable of independent living , Communication skills , Scientist, research (life sciences) , General fund of knowledge , Motivation for treatment/growth , Physical Health , Religious Affiliation , and Supportive family/friends     SUMMARY: Current SDOH Barriers:  N/A  Clinical Social Work Clinical  Goal(s):  No clinical social work goals at this time  Interventions: Discussed common feeling and emotions when being diagnosed with cancer, and the importance of support during treatment Informed patient of the support team roles and support services at [facility] Provided [place] contact information and encouraged patient to call with any questions or concerns Provided patient with information about CSW role in patient care and other available resources and patient inquired about paperwork she needs for her cancer policy, patient stated she will bring paper with her with at her next appointment.   Follow Up Plan: Patient will contact CSW with any support or resource needs Patient verbalizes understanding of plan: Yes    Alianna Wurster, LCSW

## 2022-04-01 ENCOUNTER — Other Ambulatory Visit: Payer: Self-pay | Admitting: *Deleted

## 2022-04-01 NOTE — Progress Notes (Signed)
The proposed treatment discussed in conference is for discussion purpose only and is not a binding recommendation.  The patients have not been physically examined, or presented with their treatment options.  Therefore, final treatment plans cannot be decided.  

## 2022-04-04 ENCOUNTER — Other Ambulatory Visit: Payer: Self-pay | Admitting: Thoracic Surgery (Cardiothoracic Vascular Surgery)

## 2022-04-04 DIAGNOSIS — C3491 Malignant neoplasm of unspecified part of right bronchus or lung: Secondary | ICD-10-CM

## 2022-04-05 ENCOUNTER — Ambulatory Visit (INDEPENDENT_AMBULATORY_CARE_PROVIDER_SITE_OTHER): Payer: Self-pay | Admitting: Thoracic Surgery (Cardiothoracic Vascular Surgery)

## 2022-04-05 ENCOUNTER — Ambulatory Visit: Payer: Medicare HMO | Admitting: Pulmonary Disease

## 2022-04-05 ENCOUNTER — Encounter: Payer: Self-pay | Admitting: Pulmonary Disease

## 2022-04-05 ENCOUNTER — Encounter: Payer: Self-pay | Admitting: Thoracic Surgery (Cardiothoracic Vascular Surgery)

## 2022-04-05 ENCOUNTER — Other Ambulatory Visit: Payer: Self-pay | Admitting: Thoracic Surgery (Cardiothoracic Vascular Surgery)

## 2022-04-05 ENCOUNTER — Other Ambulatory Visit: Payer: Self-pay

## 2022-04-05 ENCOUNTER — Ambulatory Visit
Admission: RE | Admit: 2022-04-05 | Discharge: 2022-04-05 | Disposition: A | Discharge status: Home / Self Care | Payer: Medicare HMO | Source: Ambulatory Visit | Attending: Thoracic Surgery (Cardiothoracic Vascular Surgery) | Admitting: Thoracic Surgery (Cardiothoracic Vascular Surgery)

## 2022-04-05 ENCOUNTER — Telehealth: Payer: Self-pay | Admitting: *Deleted

## 2022-04-05 VITALS — BP 105/67 | HR 90 | Resp 20 | Ht 60.0 in | Wt 138.0 lb

## 2022-04-05 VITALS — BP 124/64 | HR 94 | Temp 98.3°F | Ht 64.0 in | Wt 139.6 lb

## 2022-04-05 DIAGNOSIS — C3491 Malignant neoplasm of unspecified part of right bronchus or lung: Secondary | ICD-10-CM

## 2022-04-05 DIAGNOSIS — Z9889 Other specified postprocedural states: Secondary | ICD-10-CM | POA: Diagnosis not present

## 2022-04-05 DIAGNOSIS — C3490 Malignant neoplasm of unspecified part of unspecified bronchus or lung: Secondary | ICD-10-CM | POA: Diagnosis not present

## 2022-04-05 DIAGNOSIS — R590 Localized enlarged lymph nodes: Secondary | ICD-10-CM

## 2022-04-05 DIAGNOSIS — J449 Chronic obstructive pulmonary disease, unspecified: Secondary | ICD-10-CM | POA: Diagnosis not present

## 2022-04-05 MED ORDER — GABAPENTIN 300 MG PO CAPS: 300.0000 mg | ORAL_CAPSULE | Freq: Two times a day (BID) | ORAL | 6 refills | Status: DC

## 2022-04-05 MED ORDER — BREZTRI AEROSPHERE 160-9-4.8 MCG/ACT IN AERO: 2.0000 | INHALATION_SPRAY | Freq: Two times a day (BID) | RESPIRATORY_TRACT | 0 refills | Status: DC

## 2022-04-05 NOTE — Telephone Encounter (Signed)
Request for home oxygen pick up faxed to [facility] per Dr. [name].

## 2022-04-05 NOTE — Patient Instructions (Signed)
We have given you some more samples of the Breztri.  Test 2 puffs twice a day.  Rinse your mouth well after you use it.  We will see you in follow-up in 3 months time call sooner should any new problems arise.

## 2022-04-05 NOTE — Progress Notes (Signed)
[address]       [place] [address]             [phone]      HPI: Mrs. Michelle Moore returns for a scheduled postoperative follow-up visit after recent right upper lobectomy.  Michelle Moore is a 66 year old woman with a history of tobacco abuse, COPD, CAD, MI, thoracic aortic atherosclerosis, hypertension, hyperlipidemia, reflux, anxiety, depression, bladder mass, thyroid nodule, DCIS of the breast, and depression.  She had a 45-pack-year history of smoking prior to quitting in 2017.  Earlier this year she was found to have a 6 spiculated nodule in her anterior right upper lobe.  A follow-up PET/CT showed the nodule was hypermetabolic.  There was some activity in the area of the right seventh rib fracture.  CT-guided biopsy of the nodule was positive for squamous cell carcinoma.  Endobronchial ultrasound showed no evidence of metastasis to the paratracheal node.  A CT of the chest prior to surgery showed no progression of the right rib lesion so we gave her the benefit of the doubt and proceeded with a right upper lobectomy.  That was done on 03/17/2022.  Her postoperative course was unremarkable and she went home on day 5.  Final pathology showed a T3, N1, stage IIIa squamous cell carcinoma.  There was parietal pleural involvement, but no invasion into the chest wall beyond that.  She is having some significant postoperative pain.  Not surprising given her history of chronic pain.  She has been taking tramadol 100 mg about 2 or 3 times a day and also is taking Tylenol along with that.    Past Medical History:  Diagnosis Date   Adenoma of right adrenal gland 12/07/2021   a.) CT chest 12/07/2021; measured 2.3 cm; not FDG avid on 01/06/2022 PET CT.   Anxiety    a.) uses BZO (alprazolam) PRN   Aortic atherosclerosis (HCC)    Arthritis    Bladder mass 12/07/2021   a.) CT abd 12/07/2021 --> 9 x 8 mm nodular enhancing focus at the base of the bladder; suspicious for urothelial  neoplasm.   COPD (chronic obstructive pulmonary disease) (HCC)    Coronary artery disease 10/16/2009   a.) LHC/PCI 10/16/2009: EF 60%; 50% OM1, 99% dRCA (3.0 x 18 mm Xience V DES). b.) LHC 07/31/2014: EF 65%; 40% pLAD, 20% pLCx, 40% OM1, 30% oRCA, 50% pRCA, 20% mRCA, 10% ISR stent to dRCA; further intervention deferred opting for med. mgmt.   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 b.) s/p lumpectomy, adjuvant XT; current on extended AI (anastrozole) therapy   GERD (gastroesophageal reflux disease)    HLD (hyperlipidemia)    Left thyroid nodule 12/07/2021   a.) CT chest; measured 2.6 cm   Long term current use of immunosuppressive drug    a.) MTX for RA.   OSA on CPAP    Osteopenia    Personal history of radiation therapy    Pneumonia    PRES (posterior reversible encephalopathy syndrome) 07/14/2014   Pulmonary nodules 11/08/2021   a.) CSR 11/08/2021: 49mm nodular density RUL. b.) CT chest 12/07/2021: 1.9 x 1.4 x 2.5 spiculated lesion anterior RUL. c.) PET CT 01/06/2022: 2.2 x 1.9 spiculated anterior RUL mass (SUV max 16.4)   Rheumatoid arthritis (HCC)    a.) on MTX   Seasonal allergies  Seizures ([place]) 2014   a.) x 1 episode; etiology never determined.   Sinus headache    ST elevation myocardial infarction (STEMI) of inferior wall ([place]) 10/16/2009   a.) LHC/PCI 10/16/2009: EF 60%; 50% OM1, 99% dRCA (3.0 x 18 mm Xience V DES).   Tubular adenoma    Valvular heart disease     Current Outpatient Medications  Medication Sig Dispense Refill   albuterol (VENTOLIN HFA) 108 (90 Base) MCG/ACT inhaler Inhale 1-2 puffs into the lungs every 6 (six) hours as needed for wheezing or shortness of breath. 1 g 1   alendronate (FOSAMAX) 70 MG tablet Take 1 tablet (70 mg total) by mouth once a week. Take with a full glass of water on an empty stomach. Stand or sit upright for 1 hour after taking  (Patient taking differently: Take 70 mg by mouth every Wednesday. Take with a full glass of water on an empty stomach. Stand or sit upright for 1 hour after taking) 12 tablet 1   ALPRAZolam (XANAX) 0.25 MG tablet Take 1 tablet (0.25 mg total) by mouth 2 (two) times daily as needed for anxiety. (Patient taking differently: Take 0.25 mg by mouth at bedtime.) 60 tablet 1   anastrozole (ARIMIDEX) 1 MG tablet TAKE (1) TABLET BY MOUTH EVERY DAY (Patient taking differently: Take 1 mg by mouth at bedtime.) 90 tablet 1   aspirin-acetaminophen-caffeine (EXCEDRIN MIGRAINE) 250-250-65 MG tablet Take by mouth every 6 (six) hours as needed for headache.     Budeson-Glycopyrrol-Formoterol (BREZTRI AEROSPHERE) 160-9-4.8 MCG/ACT AERO Inhale 2 puffs into the lungs in the morning and at bedtime. 5.9 g 0   Budeson-Glycopyrrol-Formoterol (BREZTRI AEROSPHERE) 160-9-4.8 MCG/ACT AERO Inhale 2 puffs into the lungs in the morning and at bedtime. 10.7 g 11   fluticasone (FLONASE) 50 MCG/ACT nasal spray Place 2 sprays into both nostrils daily as needed for allergies. 60 mL 1   folic acid (FOLVITE) 1 MG tablet Take 2 mg by mouth in the morning.     furosemide (LASIX) 20 MG tablet Take 1 tablet (20 mg total) by mouth daily. 5 tablet 0   guaiFENesin (MUCINEX) 600 MG 12 hr tablet Take 2 tablets (1,200 mg total) by mouth 2 (two) times daily. 20 tablet 0   montelukast (SINGULAIR) 10 MG tablet Take 1 tablet (10 mg total) by mouth at bedtime. 90 tablet 1   omeprazole (PRILOSEC) 40 MG capsule Take 1 capsule (40 mg total) by mouth daily. 30 capsule 3   potassium chloride (KLOR-CON M) 10 MEQ tablet Take 1 tablet (10 mEq total) by mouth daily. 5 tablet 0   rosuvastatin (CRESTOR) 40 MG tablet Take 40 mg by mouth every evening.     sertraline (ZOLOFT) 100 MG tablet Take 1 tablet (100 mg total) by mouth daily. 30 tablet 3   triamcinolone cream (KENALOG) 0.1 % Apply 1 application. topically 2 (two) times daily. To affected area. 45 g 0    gabapentin (NEURONTIN) 300 MG capsule Take 1 capsule (300 mg total) by mouth 2 (two) times daily. 60 capsule 6   potassium chloride (KLOR-CON) 10 MEQ tablet TAKE (1) TABLET BY MOUTH EVERY DAY 90 tablet 1   No current facility-administered medications for this visit.    Physical Exam BP 105/67   Pulse 90   Resp 20   Ht 5' (1.524 m)   Wt 138 lb (62.6 kg)   SpO2 95% Comment: RA  BMI 26.66 kg/m  66 year old woman in no acute distress Alert and oriented x3  with no focal deficits Lungs slightly diminished at right base but otherwise clear Cardiac regular rate and rhythm normal S1 and S2 Incisions clean dry and intact No peripheral edema  Diagnostic Tests: CHEST - 2 VIEW   COMPARISON:  03/22/2022   FINDINGS: Postsurgical changes again noted on the right. Previously seen air-fluid level in the right upper hemithorax no longer visualized. Right basilar scarring or atelectasis. Left lung clear. Heart is normal size. No acute bony abnormality.   IMPRESSION: Postoperative changes on the right. Right upper lobe lung air-fluid level no longer visualized.     Electronically Signed   By: Rolm Baptise M.D.   On: 04/05/2022 12:15 I personally reviewed the chest x-ray images.  Postoperative changes from right upper lobectomy.  Impression: Michelle Moore is a 66 year old woman with a history of tobacco abuse, COPD, CAD, MI, thoracic aortic atherosclerosis, hypertension, hyperlipidemia, reflux, anxiety, depression, bladder mass, thyroid nodule, DCIS of the breast, and depression.    She was found to have a right upper lobe lung mass earlier this year.  Biopsy showed squamous cell carcinoma.  There was a question as to whether this was a T1 or T3 lesion.  She had some borderline right paratracheal adenopathy but endobronchial ultrasound was negative for metastatic disease.  There also was some activity in right seventh rib fracture.  A follow-up CT showed no progression of the mass lesion in  that area, which did not completely rule out the possibility of metastatic disease, but we discussed that and it was felt reasonable to give her the benefit of the doubt and resect the primary tumor.  Pathology showed a T3, N1, stage IIIa squamous cell carcinoma.  There was parietal pleural involvement and there was a positive level 10 node.  There also was evidence of vascular invasion.  She needs to complete her metastatic work-up.  She needs a MRI of the brain.  I will go ahead and schedule that to hopefully save some time.  We will schedule that to be done at [facility]  She has an appointment with Dr. Grayland Ormond later this week and she can follow-up with him regarding that issue.  I will defer to him as to whether he thinks that it is necessary to biopsy the rib lesion.  Surgical standpoint she is doing reasonably well.  She does have some neuropathic pain, which is not surprising given her chronic pain issues.  She is on gabapentin 100 mg twice daily.  I am going to increase that to 300 mg twice daily and I put a new prescription in for that.  She can continue to use tramadol as needed as well.  Overall unhappy with her progress.  Plan: MRI of the brain Follow-up with Dr. Grayland Ormond this week as scheduled Follow-up with Dr. Patsey Berthold as scheduled Return in 1 month with PA and lateral chest x-ray to check on progress  Melrose Nakayama, MD [facility] [phone]

## 2022-04-05 NOTE — Progress Notes (Signed)
Subjective:    Patient ID: Michelle Moore, female    DOB: [birth year], 66 y.o.   MRN: [id number] Patient Care Team: Lyndon Code, MD as PCP - General (Internal Medicine) Glory Buff, RN as Oncology Nurse Navigator Orlie Dakin, Tollie Pizza, MD as Consulting Physician (Oncology)  Chief Complaint  Patient presents with   Follow-up    bronch 03/02/2022-SOB with exertion.    HPI The patient is a 66 year old former smoker with a 30-pack-year history of smoking who presents for follow-up after bronchoscopy with endobronchial ultrasound performed 21 February 2022.  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 Patient was presented at [facility] given the indeterminate nature of her adenopathy on PET/CT.  Biopsy was performed as this would impact management of her cancer.  She has a right upper lobe lung nodule that has been noted to be compatible with squamous cell carcinoma and she is being considered for thoracic surgery.  The patient underwent endobronchial ultrasound with TBNA as noted on 12 June, the lymph node of concern on PET/CT was sampled showed adequate lymphoid population but no malignancy.  The remainder of the examination with EBUS did not show any specific targets to biopsy.  The patient is to have lobectomy by Dr. Dorris Fetch.  She tolerated EBUS well and without sequela.  DATA 12/07/2021 CT chest: 1.9 x 1.4 x 2.5 cm spiculated lesion right upper lobe, 9 mm paratracheal lymph node, on the right breast. 01/06/2022 PET/CT: 2.2 x 1.9 spiculated nodule anterior right upper lobe SUV 16.4 consistent with malignancy, low right paratracheal node 12 mm short axis SUV 4.5 (indeterminate).  Destructive changes involving right lateral seventh rib worrisome for metastatic disease. 01/12/2022 PFTs ([facility]): FEV1 0.9 L or 44%, FVC 1.23 L or 47%, FEV1/FVC 73%.  Minimal bronchodilator response.  Lung volumes  mildly reduced likely due to air trapping.  Diffusion capacity moderately reduced at 57%.  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 02/08/2022 right upper lobe biopsy: Percutaneous needle biopsy consistent with non-small cell carcinoma favor squamous cell carcinoma. 03/02/2022 EBUS TBNA 4R lymph node: Negative for malignancy. 03/17/2022 surgical path: Right upper lobe squamous cell carcinoma, 1 out of 18 lymph nodes positive for carcinoma  Review of Systems A 10 point review of systems was performed and it is as noted above otherwise negative.  Patient Active Problem List   Diagnosis Date Noted   S/P lobectomy of lung 03/17/2022   S/P robot-assisted surgical procedure 03/17/2022   Squamous cell carcinoma of right lung (HCC) 02/15/2022   S/P cardiac catheterization 04/06/2021   Osteoporosis without current pathological fracture 04/06/2021   Aromatase inhibitor use 04/06/2021   Special screening for malignant neoplasms, colon    Family history of colon cancer    Polyp of descending colon    Cough 08/28/2020   Hypokalemia 07/25/2020   Urinary tract infection without hematuria 07/25/2020   Recurrent displacement of lumbar disc 04/08/2020   Essential hypertension 01/22/2020   Degenerative tear of glenoid labrum of right shoulder 07/19/2019   Nontraumatic complete tear of right rotator cuff 07/19/2019   Rotator cuff tendinitis, right 07/19/2019   Tendinitis of upper biceps tendon of right shoulder 07/19/2019   Encounter for general adult medical examination with abnormal findings 05/29/2019   Dysuria 05/29/2019   Acute non-recurrent pansinusitis 06/01/2018   Perennial allergic rhinitis 06/01/2018   Generalized anxiety disorder 06/01/2018   Ductal  carcinoma in situ (DCIS) of right breast 03/18/2018   Coronary artery disease 11/09/2017   Mixed hyperlipidemia 11/09/2017   Bilateral carotid artery stenosis 08/14/2014   Gastroesophageal reflux disease with  esophagitis 08/14/2014   OSA on CPAP 07/14/2014   Weight loss of more than 10% body weight 07/14/2014   Cervical spondylosis with radiculopathy 01/20/2014   Cervical spondylosis without myelopathy 01/20/2014   Social History   Tobacco Use   Smoking status: Former    Packs/day: 1.00    Years: 30.00    Total pack years: 30.00    Types: Cigarettes    Quit date: 09/13/2015    Years since quitting: 6.5   Smokeless tobacco: Never  Substance Use Topics   Alcohol use: Yes    Comment: occasional   Allergies  Allergen Reactions   Other     BANDAIDS-OF LEFT ON FOR AN EXTENDED PERIOD OF TIME  Patient states latex rubber gloves are ok   Current Meds  Medication Sig   albuterol (VENTOLIN HFA) 108 (90 Base) MCG/ACT inhaler Inhale 1-2 puffs into the lungs every 6 (six) hours as needed for wheezing or shortness of breath.   alendronate (FOSAMAX) 70 MG tablet Take 1 tablet (70 mg total) by mouth once a week. Take with a full glass of water on an empty stomach. Stand or sit upright for 1 hour after taking (Patient taking differently: Take 70 mg by mouth every Wednesday. Take with a full glass of water on an empty stomach. Stand or sit upright for 1 hour after taking)   ALPRAZolam (XANAX) 0.25 MG tablet Take 1 tablet (0.25 mg total) by mouth 2 (two) times daily as needed for anxiety. (Patient taking differently: Take 0.25 mg by mouth at bedtime.)   anastrozole (ARIMIDEX) 1 MG tablet TAKE (1) TABLET BY MOUTH EVERY DAY (Patient taking differently: Take 1 mg by mouth at bedtime.)   aspirin-acetaminophen-caffeine (EXCEDRIN MIGRAINE) 250-250-65 MG tablet Take by mouth every 6 (six) hours as needed for headache.   Budeson-Glycopyrrol-Formoterol (BREZTRI AEROSPHERE) 160-9-4.8 MCG/ACT AERO Inhale 2 puffs into the lungs in the morning and at bedtime.   Budeson-Glycopyrrol-Formoterol (BREZTRI AEROSPHERE) 160-9-4.8 MCG/ACT AERO Inhale 2 puffs into the lungs in the morning and at bedtime.   fluticasone (FLONASE)  50 MCG/ACT nasal spray Place 2 sprays into both nostrils daily as needed for allergies.   folic acid (FOLVITE) 1 MG tablet Take 2 mg by mouth in the morning.   gabapentin (NEURONTIN) 300 MG capsule Take 1 capsule (300 mg total) by mouth 2 (two) times daily.   montelukast (SINGULAIR) 10 MG tablet Take 1 tablet (10 mg total) by mouth at bedtime.   omeprazole (PRILOSEC) 40 MG capsule Take 1 capsule (40 mg total) by mouth daily.   potassium chloride (KLOR-CON M) 10 MEQ tablet Take 1 tablet (10 mEq total) by mouth daily.   potassium chloride (KLOR-CON) 10 MEQ tablet TAKE (1) TABLET BY MOUTH EVERY DAY   rosuvastatin (CRESTOR) 40 MG tablet Take 40 mg by mouth every evening.   sertraline (ZOLOFT) 100 MG tablet Take 1 tablet (100 mg total) by mouth daily.   [DISCONTINUED] Budeson-Glycopyrrol-Formoterol (BREZTRI AEROSPHERE) 160-9-4.8 MCG/ACT AERO Inhale 2 puffs into the lungs in the morning and at bedtime.   [DISCONTINUED] furosemide (LASIX) 20 MG tablet Take 1 tablet (20 mg total) by mouth daily.   [DISCONTINUED] guaiFENesin (MUCINEX) 600 MG 12 hr tablet Take 2 tablets (1,200 mg total) by mouth 2 (two) times daily.   [DISCONTINUED] triamcinolone cream (KENALOG) 0.1 %  Apply 1 application. topically 2 (two) times daily. To affected area.   Immunization History  Administered Date(s) Administered   Influenza Inj Mdck Quad Pf 04/24/2020, 06/03/2021   Influenza,inj,Quad PF,6+ Mos 08/02/2018   Influenza,inj,quad, With Preservative 06/17/2019   Influenza-Unspecified 06/17/2019   Moderna SARS-COV2 Booster Vaccination 08/04/2020   Moderna Sars-Covid-2 Vaccination 11/13/2019, 12/11/2019   Pneumococcal Conjugate-13 06/17/2019   Pneumococcal Polysaccharide-23 06/17/2019       Objective:   Physical Exam BP 124/64 (BP Location: Left Arm, Cuff Size: Normal)   Pulse 94   Temp 98.3 F (36.8 C) (Temporal)   Ht 5\' 4"  (1.626 m)   Wt 139 lb 9.6 oz (63.3 kg)   SpO2 93%   BMI 23.96 kg/m  GENERAL:  Well-developed, well-nourished woman, no acute distress. Fully ambulatory.  No conversational dyspnea. HEAD: Normocephalic, atraumatic.  EYES: Pupils equal, round, reactive to light.  No scleral icterus.  MOUTH: Oral mucosa moist.  No thrush. NECK: Supple. No thyromegaly. Trachea midline. No JVD.  No adenopathy. PULMONARY: Good air entry bilaterally.  No adventitious sounds. CARDIOVASCULAR: S1 and S2. Regular rate and rhythm.  No rubs, murmurs or gallops heard. ABDOMEN: Benign. MUSCULOSKELETAL: No joint deformity, no clubbing, no edema.  NEUROLOGIC: No overt focal deficit, no gait disturbance, speech is fluent. SKIN: Intact,warm,dry. PSYCH: Normal mood, normal behavior.      Assessment & Plan:     ICD-10-CM   1. Squamous cell carcinoma of right lung (HCC)  C34.91    She is to have lobectomy by Dr. Dorris Fetch    2. Mediastinal adenopathy  R59.0    Negative EBUS 12 February 11, 2022    3. Stage 3 severe COPD by GOLD classification (HCC)  J44.9    Continue Breztri 2 puffs twice a day Continue as needed albuterol     Meds ordered this encounter  Medications   DISCONTD: Budeson-Glycopyrrol-Formoterol (BREZTRI AEROSPHERE) 160-9-4.8 MCG/ACT AERO    Sig: Inhale 2 puffs into the lungs in the morning and at bedtime.    Dispense:  5.9 g    Refill:  0    Order Specific Question:   Lot Number?    Answer:   [id number]    Order Specific Question:   Expiration Date?    Answer:   08/12/2024    Order Specific Question:   Manufacturer?    Answer:   [employer] [[id number]]    Order Specific Question:   Quantity    Answer:   2   Will see the patient in follow-up in 3 months time she is to contact us prior to that time should any new difficulties arise.  Gailen Shelter, MD Advanced Bronchoscopy [facility]    *This note was dictated using voice recognition software/Dragon.  Despite best efforts to proofread, errors can occur which can change the meaning. Any transcriptional  errors that result from this process are unintentional and may not be fully corrected at the time of dictation.

## 2022-04-06 ENCOUNTER — Other Ambulatory Visit: Payer: Self-pay

## 2022-04-06 ENCOUNTER — Telehealth: Payer: Self-pay | Admitting: *Deleted

## 2022-04-06 MED ORDER — ALENDRONATE SODIUM 70 MG PO TABS: 70.0000 mg | ORAL_TABLET | ORAL | 1 refills | Status: DC

## 2022-04-07 ENCOUNTER — Inpatient Hospital Stay: Payer: Medicare HMO | Admitting: Oncology

## 2022-04-07 ENCOUNTER — Encounter: Payer: Self-pay | Admitting: Oncology

## 2022-04-07 VITALS — BP 126/70 | HR 79 | Temp 97.2°F | Resp 16 | Ht 64.0 in | Wt 139.0 lb

## 2022-04-07 DIAGNOSIS — Z87891 Personal history of nicotine dependence: Secondary | ICD-10-CM | POA: Diagnosis not present

## 2022-04-07 DIAGNOSIS — M8580 Other specified disorders of bone density and structure, unspecified site: Secondary | ICD-10-CM | POA: Diagnosis not present

## 2022-04-07 DIAGNOSIS — C3491 Malignant neoplasm of unspecified part of right bronchus or lung: Secondary | ICD-10-CM | POA: Diagnosis not present

## 2022-04-07 DIAGNOSIS — D0511 Intraductal carcinoma in situ of right breast: Secondary | ICD-10-CM | POA: Diagnosis not present

## 2022-04-07 DIAGNOSIS — C3411 Malignant neoplasm of upper lobe, right bronchus or lung: Secondary | ICD-10-CM | POA: Diagnosis not present

## 2022-04-07 DIAGNOSIS — Z923 Personal history of irradiation: Secondary | ICD-10-CM | POA: Diagnosis not present

## 2022-04-07 DIAGNOSIS — C779 Secondary and unspecified malignant neoplasm of lymph node, unspecified: Secondary | ICD-10-CM | POA: Diagnosis not present

## 2022-04-07 DIAGNOSIS — Z79899 Other long term (current) drug therapy: Secondary | ICD-10-CM | POA: Diagnosis not present

## 2022-04-07 NOTE — Progress Notes (Signed)
START ON PATHWAY REGIMEN - Non-Small Cell Lung     A cycle is every 21 days:     Docetaxel      Cisplatin   **Always confirm dose/schedule in your pharmacy ordering system**  Patient Characteristics: Postoperative without Neoadjuvant Therapy (Pathologic Staging), Stage III, Adjuvant Chemotherapy, Squamous Cell Therapeutic Status: Postoperative without Neoadjuvant Therapy (Pathologic Staging) AJCC T Category: pT3 AJCC N Category: pN1 AJCC M Category: cM0 AJCC 8 Stage Grouping: IIIA Histology: Squamous Cell Intent of Therapy: Curative Intent, Discussed with Patient

## 2022-04-07 NOTE — Progress Notes (Signed)
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  INTERVAL HISTORY: Patient returns to clinic today postoperatively for further evaluation, discussion of her final pathology results, and treatment planning.  She tolerated her right upper lobectomy well without significant side effects.  She continues to have residual tenderness at her surgical site. She has no neurologic complaints.  She denies any recent fevers or illnesses.  She has a good appetite and denies weight loss.  She denies any chest pain, shortness of breath, cough, or hemoptysis.  She denies any nausea, vomiting, constipation, or diarrhea. She has no urinary complaints.  Patient offers no further specific complaints today.  REVIEW OF SYSTEMS:   Review of Systems  Constitutional: Negative.  Negative for fever, malaise/fatigue and weight loss.  Respiratory: Negative.  Negative for cough, hemoptysis and shortness of breath.   Cardiovascular: Negative.  Negative for chest pain and leg swelling.  Gastrointestinal: Negative.  Negative for abdominal pain.  Genitourinary: Negative.  Negative for dysuria.  Musculoskeletal: Negative.  Negative for back pain and myalgias.  Skin: Negative.  Negative for rash.  Neurological: Negative.  Negative for dizziness, sensory change, focal weakness and weakness.  Psychiatric/Behavioral: Negative.  The patient is not nervous/anxious.     As per HPI. Otherwise, a complete review of systems is negative.  PAST MEDICAL HISTORY: Past Medical History:  Diagnosis Date   Adenoma of right adrenal gland  12/07/2021   a.) CT chest 12/07/2021; measured 2.3 cm; not FDG avid on 01/06/2022 PET CT.   Anxiety    a.) uses BZO (alprazolam) PRN   Aortic atherosclerosis (HCC)    Arthritis    Bladder mass 12/07/2021   a.) CT abd 12/07/2021 --> 9 x 8 mm nodular enhancing focus at the base of the bladder; suspicious for urothelial neoplasm.   COPD (chronic obstructive pulmonary disease) (HCC)    Coronary artery disease 10/16/2009   a.) LHC/PCI 10/16/2009: EF 60%; 50% OM1, 99% dRCA (3.0 x 18 mm Xience V DES). b.) LHC 07/31/2014: EF 65%; 40% pLAD, 20% pLCx, 40% OM1, 30% oRCA, 50% pRCA, 20% mRCA, 10% ISR stent to dRCA; further intervention deferred opting for med. mgmt.   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 b.) s/p lumpectomy, adjuvant XT; current on extended AI (anastrozole) therapy   GERD (gastroesophageal reflux disease)    HLD (hyperlipidemia)    Left thyroid nodule 12/07/2021   a.) CT chest; measured 2.6 cm   Long term current use of immunosuppressive drug    a.) MTX for RA.   OSA on CPAP    Osteopenia    Personal history of radiation therapy    Pneumonia    PRES (posterior reversible encephalopathy syndrome) 07/14/2014   Pulmonary nodules 11/08/2021   a.) CSR 11/08/2021: 27m nodular density RUL. b.) CT chest 12/07/2021: 1.9 x 1.4 x 2.5 spiculated lesion anterior RUL. c.) PET CT 01/06/2022: 2.2 x 1.9 spiculated anterior RUL mass (SUV max 16.4)   Rheumatoid arthritis (HCC)    a.) on MTX  Seasonal allergies    Seizures ([place]) 2014   a.) x 1 episode; etiology never determined.   Sinus headache    ST elevation myocardial infarction (STEMI) of inferior wall ([place]) 10/16/2009   a.) LHC/PCI 10/16/2009: EF 60%; 50% OM1, 99% dRCA (3.0 x 18 mm Xience V DES).   Tubular adenoma    Valvular heart disease     PAST SURGICAL HISTORY: Past Surgical History:  Procedure Laterality Date   ABDOMINAL  HYSTERECTOMY     ANTERIOR CERVICAL DECOMP/DISCECTOMY FUSION N/A 01/20/2014   Procedure: ANTERIOR CERVICAL DECOMPRESSION/DISCECTOMY FUSION 2 LEVELS cervical five/six six Tarry Kos;  Surgeon: Ophelia Charter, MD;  Location: [facility];  Service: Neurosurgery;  Laterality: N/A;   APPENDECTOMY     BACK SURGERY     lumbar   BREAST BIOPSY Right 02/01/2018   x shape, DCIS    BREAST BIOPSY  01/16/2019   coil, BENIGN MAMMARY TISSUE WITH MODERATE STROMAL FIBROSIS AND COARSE DYSTROPHIC CALCIFICATIONS of the RIGHT breast   BREAST LUMPECTOMY Right 03/07/2018   Procedure: BREAST LUMPECTOMY/ RE EXCISION;  Surgeon: Herbert Pun, MD;  Location: [facility];  Service: General;  Laterality: Right;   CARDIAC CATHETERIZATION Left 07/31/2014   Procedure: CARDIAC CATHETERIZATION; Location: [facility]; Surgeon: Serafina Royals, MD   COLONOSCOPY WITH PROPOFOL N/A 02/11/2021   Procedure: COLONOSCOPY WITH PROPOFOL;  Surgeon: Lucilla Lame, MD;  Location: [facility];  Service: Endoscopy;  Laterality: N/A;  Requests Early   CORONARY ANGIOPLASTY WITH STENT PLACEMENT Left 10/16/2009   Procedure: CORONARY ANGIOPLASTY WITH STENT PLACEMENT; Location: [facility]; Surgeon: Katrine Coho, MD   INTERCOSTAL NERVE BLOCK Right 03/17/2022   Procedure: INTERCOSTAL NERVE BLOCK;  Surgeon: Melrose Nakayama, MD;  Location: [facility];  Service: Thoracic;  Laterality: Right;   LASIK Bilateral    LYMPH NODE DISSECTION Right 03/17/2022   Procedure: LYMPH NODE DISSECTION;  Surgeon: Melrose Nakayama, MD;  Location: [facility];  Service: Thoracic;  Laterality: Right;   PARTIAL MASTECTOMY WITH NEEDLE LOCALIZATION Right 02/21/2018   Procedure: PARTIAL MASTECTOMY WITH NEEDLE LOCALIZATION;  Surgeon: Herbert Pun, MD;  Location: [facility];  Service: General;  Laterality: Right;   POLYPECTOMY N/A 02/11/2021   Procedure: POLYPECTOMY;  Surgeon: Lucilla Lame, MD;  Location: [facility];  Service: Endoscopy;  Laterality: N/A;   ROTATOR  CUFF REPAIR Left    SHOULDER ARTHROSCOPY WITH ROTATOR CUFF REPAIR AND OPEN BICEPS TENODESIS Right 08/07/2019   Procedure: SHOULDER ARTHROSCOPY WITH DEBRIDEMENT, DECOMPRESSION, ROTATOR CUFF REPAIR AND BICEPS TENODESIS. - RNFA;  Surgeon: Corky Mull, MD;  Location: [facility];  Service: Orthopedics;  Laterality: Right;   TOE SURGERY Right    has pin and plate in it   VIDEO BRONCHOSCOPY WITH ENDOBRONCHIAL ULTRASOUND N/A 03/02/2022   Procedure: VIDEO BRONCHOSCOPY WITH ENDOBRONCHIAL ULTRASOUND;  Surgeon: Tyler Pita, MD;  Location: [facility];  Service: Cardiopulmonary;  Laterality: N/A;    FAMILY HISTORY: Family History  Problem Relation Age of Onset   Breast cancer Cousin 22       bilateral at 62 and 75; daughter of maternal aunt who was unaffected   Lung cancer Maternal Aunt        dx 36s; deceased 87s; smoker   Heart attack Father        deceased 78   Stomach cancer Maternal Grandmother     ADVANCED DIRECTIVES (Y/N):  N  HEALTH MAINTENANCE: Social History   Tobacco Use   Smoking status: Former    Packs/day: 1.00    Years: 30.00  Total pack years: 30.00    Types: Cigarettes    Quit date: 09/13/2015    Years since quitting: 6.5   Smokeless tobacco: Never  Vaping Use   Vaping Use: Never used  Substance Use Topics   Alcohol use: Yes    Comment: occasional   Drug use: No     Colonoscopy:  PAP:  Bone density:  Lipid panel:  Allergies  Allergen Reactions   Other     BANDAIDS-OF LEFT ON FOR AN EXTENDED PERIOD OF TIME  Patient states latex rubber gloves are ok    Current Outpatient Medications  Medication Sig Dispense Refill   albuterol (VENTOLIN HFA) 108 (90 Base) MCG/ACT inhaler Inhale 1-2 puffs into the lungs every 6 (six) hours as needed for wheezing or shortness of breath. 1 g 1   alendronate (FOSAMAX) 70 MG tablet Take 1 tablet (70 mg total) by mouth once a week. Take with a full glass of water on an empty stomach. Stand or sit upright for 1 hour after  taking 12 tablet 1   ALPRAZolam (XANAX) 0.25 MG tablet Take 1 tablet (0.25 mg total) by mouth 2 (two) times daily as needed for anxiety. (Patient taking differently: Take 0.25 mg by mouth at bedtime.) 60 tablet 1   anastrozole (ARIMIDEX) 1 MG tablet TAKE (1) TABLET BY MOUTH EVERY DAY (Patient taking differently: Take 1 mg by mouth at bedtime.) 90 tablet 1   aspirin-acetaminophen-caffeine (EXCEDRIN MIGRAINE) 250-250-65 MG tablet Take by mouth every 6 (six) hours as needed for headache.     Budeson-Glycopyrrol-Formoterol (BREZTRI AEROSPHERE) 160-9-4.8 MCG/ACT AERO Inhale 2 puffs into the lungs in the morning and at bedtime. 10.7 g 11   Budeson-Glycopyrrol-Formoterol (BREZTRI AEROSPHERE) 160-9-4.8 MCG/ACT AERO Inhale 2 puffs into the lungs in the morning and at bedtime. 5.9 g 0   fluticasone (FLONASE) 50 MCG/ACT nasal spray Place 2 sprays into both nostrils daily as needed for allergies. 60 mL 1   folic acid (FOLVITE) 1 MG tablet Take 2 mg by mouth in the morning.     gabapentin (NEURONTIN) 300 MG capsule Take 1 capsule (300 mg total) by mouth 2 (two) times daily. 60 capsule 6   montelukast (SINGULAIR) 10 MG tablet Take 1 tablet (10 mg total) by mouth at bedtime. 90 tablet 1   omeprazole (PRILOSEC) 40 MG capsule Take 1 capsule (40 mg total) by mouth daily. 30 capsule 3   potassium chloride (KLOR-CON M) 10 MEQ tablet Take 1 tablet (10 mEq total) by mouth daily. 5 tablet 0   potassium chloride (KLOR-CON) 10 MEQ tablet TAKE (1) TABLET BY MOUTH EVERY DAY 90 tablet 1   rosuvastatin (CRESTOR) 40 MG tablet Take 40 mg by mouth every evening.     sertraline (ZOLOFT) 100 MG tablet Take 1 tablet (100 mg total) by mouth daily. 30 tablet 3   methotrexate (RHEUMATREX) 2.5 MG tablet Take 20 mg by mouth once a week.     ondansetron (ZOFRAN) 8 MG tablet Take 1 tablet (8 mg total) by mouth 2 (two) times daily as needed (Nausea or vomiting). Begin on the third day after cisplatin chemotherapy. 60 tablet 1    prochlorperazine (COMPAZINE) 10 MG tablet Take 1 tablet (10 mg total) by mouth every 6 (six) hours as needed (Nausea or vomiting). 60 tablet 1   No current facility-administered medications for this visit.    OBJECTIVE: Vitals:   04/07/22 1105  BP: 126/70  Pulse: 79  Resp: 16  Temp: (!) 97.2 F (36.2 C)  SpO2: 98%     Body mass index is 23.86 kg/m.    ECOG FS:0 - Asymptomatic  General: Well-developed, well-nourished, no acute distress. Eyes: Pink conjunctiva, anicteric sclera. HEENT: Normocephalic, moist mucous membranes. Lungs: No audible wheezing or coughing. Heart: Regular rate and rhythm. Abdomen: Soft, nontender, no obvious distention. Musculoskeletal: No edema, cyanosis, or clubbing. Neuro: Alert, answering all questions appropriately. Cranial nerves grossly intact. Skin: No rashes or petechiae noted. Psych: Normal affect.  LAB RESULTS:  Lab Results  Component Value Date   NA 141 03/19/2022   K 3.7 03/19/2022   CL 107 03/19/2022   CO2 26 03/19/2022   GLUCOSE 127 (H) 03/19/2022   BUN 9 03/19/2022   CREATININE 0.55 03/19/2022   CALCIUM 8.0 (L) 03/19/2022   PROT 5.4 (L) 03/19/2022   ALBUMIN 2.9 (L) 03/19/2022   AST 19 03/19/2022   ALT 15 03/19/2022   ALKPHOS 57 03/19/2022   BILITOT 0.4 03/19/2022   GFRNONAA >60 03/19/2022   GFRAA >60 04/09/2020    Lab Results  Component Value Date   WBC 10.0 03/19/2022   NEUTROABS 6.1 06/09/2021   HGB 10.9 (L) 03/19/2022   HCT 35.1 (L) 03/19/2022   MCV 95.4 03/19/2022   PLT 161 03/19/2022     STUDIES: DG Chest 2 View  Result Date: 04/05/2022 CLINICAL DATA:  Lung cancer EXAM: CHEST - 2 VIEW COMPARISON:  03/22/2022 FINDINGS: Postsurgical changes again noted on the right. Previously seen air-fluid level in the right upper hemithorax no longer visualized. Right basilar scarring or atelectasis. Left lung clear. Heart is normal size. No acute bony abnormality. IMPRESSION: Postoperative changes on the right. Right upper  lobe lung air-fluid level no longer visualized. Electronically Signed   By: Rolm Baptise M.D.   On: 04/05/2022 12:15   DG Chest 2 View  Result Date: 03/22/2022 CLINICAL DATA:  Status post lobectomy of lung. EXAM: CHEST - 2 VIEW COMPARISON:  Chest two views 03/21/2022 trauma CT chest 03/10/2022 FINDINGS: Cardiac silhouette and mediastinal contours are within normal limits. Mild calcification within aortic arch. Postsurgical changes are again seen of right upper lobectomy. Air-fluid level is again seen within the anterior superior right hemithorax status post lobectomy, as expected. No significant change. No evidence of increasing pleural air. Small right costophrenic angle pleural effusion. Mild right basilar likely subsegmental atelectasis. Left lung is clear. ACDF hardware is noted. Minimal partial visualization of lumbar spine fusion hardware. IMPRESSION: No significant change in right upper lobectomy postsurgical change with expected air-fluid level. No increasing pleural air or unexpected finding. Electronically Signed   By: Yvonne Kendall M.D.   On: 03/22/2022 08:10   DG Chest 2 View  Result Date: 03/21/2022 CLINICAL DATA:  Follow-up lobectomy EXAM: CHEST - 2 VIEW COMPARISON:  03/20/2022 a FINDINGS: Artifact overlies the chest. Heart size is normal. Aortic atherosclerosis is present. There is been lobectomy on the right. Air-fluid level in the post lobectomy space as expected. No evidence of increasing pleural air. IMPRESSION: Post lobectomy air-fluid level on the right as expected. No increasing pleural air or unexpected finding. Electronically Signed   By: Nelson Chimes M.D.   On: 03/21/2022 08:06   DG Chest 1V REPEAT Same Day  Result Date: 03/20/2022 CLINICAL DATA:  Chest tube removal. EXAM: CHEST - 1 VIEW SAME DAY COMPARISON:  Chest x-ray 03/20/2022 FINDINGS: Right chest tube has been removed. There is no pneumothorax. Multifocal airspace disease most significant in the right lower lung and left  upper lobe appears unchanged. No pleural  effusion. Cardiomediastinal silhouette is stable. Osseous structures are unchanged. Right chest wall emphysema has decreased. IMPRESSION: 1. Right chest tube removal. 2. No pneumothorax. 3. Stable multifocal airspace disease. Electronically Signed   By: Ronney Asters M.D.   On: 03/20/2022 15:06   DG CHEST PORT 1 VIEW  Result Date: 03/20/2022 CLINICAL DATA:  Status post lobectomy. EXAM: PORTABLE CHEST 1 VIEW COMPARISON:  7023 FINDINGS: 0603 hours. Right chest tube again noted without evidence of pneumothorax. Stable volume loss right chest. Similar appearance of atelectasis in the right base with interval improved aeration at the left base. Small left effusion evident. Telemetry leads overlie the chest. IMPRESSION: 1. No evidence for pneumothorax. 2. Similar atelectasis at the right base. Improved aeration at the left base. Electronically Signed   By: Misty Stanley M.D.   On: 03/20/2022 09:12   DG Chest Port 1 View  Result Date: 03/19/2022 CLINICAL DATA:  Status post right upper lobectomy 2 days ago. EXAM: PORTABLE CHEST 1 VIEW COMPARISON:  Chest x-ray from yesterday. FINDINGS: Unchanged right-sided chest tube. Unchanged mild diffuse interstitial thickening against a background of coarse interstitial markings. Continued right-greater-than-left lower lobe atelectasis. No focal consolidation, pleural effusion, or pneumothorax. Stable cardiomediastinal silhouette. No acute osseous abnormality. IMPRESSION: 1. Stable chest.  No pneumothorax. Electronically Signed   By: Titus Dubin M.D.   On: 03/19/2022 12:23   DG CHEST PORT 1 VIEW  Result Date: 03/18/2022 CLINICAL DATA:  Provided history: Pneumothorax.  History of COPD. EXAM: PORTABLE CHEST 1 VIEW COMPARISON:  Prior chest radiographs 03/17/2022 and earlier. Chest CT 03/10/2022 FINDINGS: Unchanged position of a right-sided chest tube with tip projecting at the level of the lung apex. Cardiomediastinal silhouette  unchanged. Aortic atherosclerosis. Postoperative changes from right upper lobectomy. Ill-defined opacity within the right mid to lower lung field, likely reflecting atelectasis. Mild atelectasis within the left lung base, new. Background prominence of the interstitial lung markings, suggesting interstitial edema. No definite pneumothorax, or evidence of pleural effusion. ACDF hardware. Subcutaneous gas within the right lower neck, similar to the prior examination. IMPRESSION: 1. Postoperative changes from recent right upper lobectomy. 2. Stable position of a right-sided chest tube. No definite pneumothorax. 3. Persistent ill-defined opacity within the right mid-to-lower lung field, likely reflecting atelectasis. 4. Mild atelectasis within the left lung base, new from the prior examination. 5. Background prominence of the interstitial lung markings, suggesting interstitial edema. 6.  Aortic Atherosclerosis (ICD10-I70.0). Electronically Signed   By: Kellie Simmering D.O.   On: 03/18/2022 08:29   DG Chest Port 1 View  Result Date: 03/17/2022 CLINICAL DATA:  Status post robot assisted surgical procedure without any shortness of breath or chest pain. EXAM: PORTABLE CHEST 1 VIEW COMPARISON:  Chest plain film, dated March 14, 2022, and chest CT dated March 10, 2022 FINDINGS: A right-sided chest tube is seen with its distal tip overlying the medial aspect of the right apex. The cardiac silhouette is mildly enlarged. Mild to moderate severity bibasilar atelectasis is seen. An ill-defined opacity is seen overlying the right hilum. This may correspond to a spiculated nodule seen within the anterior right upper lobe on the prior chest CT. Multiple overlying surgical sutures are also suspected. There is no evidence of a pleural effusion or pneumothorax. A radiopaque fusion plate and screws are seen overlying the lower cervical spine. IMPRESSION: 1. Right-sided chest tube with its distal tip overlying the medial aspect of the right  apex. 2. Mild to moderate severity bibasilar atelectasis. 3. Findings which may correspond to  recent surgical procedure and spiculated nodule seen within the right lung on the prior chest CT. Electronically Signed   By: Virgina Norfolk M.D.   On: 03/17/2022 19:49   DG Chest 2 View  Result Date: 03/15/2022 CLINICAL DATA:  Preoperative assessment for robotic assisted thoracoscopy and right upper lobectomy. EXAM: CHEST - 2 VIEW COMPARISON:  02/08/2022. FINDINGS: The heart size and mediastinal contours are within normal limits. There is atherosclerotic calcification of the aorta. A nodular opacity is present in the right upper lobe measuring 2.3 cm, not significantly changed from the prior exam. No effusion or pneumothorax. Degenerative changes are present in the thoracic spine. Cervical spinal fusion hardware is present. IMPRESSION: 1. No active cardiopulmonary disease. 2. Stable right upper lobe nodular opacity. Electronically Signed   By: Brett Fairy M.D.   On: 03/15/2022 02:47   MM 3D SCREEN BREAST BILATERAL  Result Date: 03/14/2022 CLINICAL DATA:  Screening. EXAM: DIGITAL SCREENING BILATERAL MAMMOGRAM WITH TOMOSYNTHESIS AND CAD TECHNIQUE: Bilateral screening digital craniocaudal and mediolateral oblique mammograms were obtained. Bilateral screening digital breast tomosynthesis was performed. The images were evaluated with computer-aided detection. COMPARISON:  Previous exam(s). ACR Breast Density Category d: The breast tissue is extremely dense, which lowers the sensitivity of mammography FINDINGS: There are no findings suspicious for malignancy. IMPRESSION: No mammographic evidence of malignancy. A result letter of this screening mammogram will be mailed directly to the patient. RECOMMENDATION: Screening mammogram in one year. (Code:[id number]-B-01Y) BI-RADS CATEGORY  1: Negative. Electronically Signed   By: Kristopher Oppenheim M.D.   On: 03/14/2022 10:41   CT Chest Wo Contrast  Result Date: 03/11/2022 CLINICAL  DATA:  Follow-up right lung nodule. Personal history of breast carcinoma. EXAM: CT CHEST WITHOUT CONTRAST TECHNIQUE: Multidetector CT imaging of the chest was performed following the standard protocol without IV contrast. RADIATION DOSE REDUCTION: This exam was performed according to the departmental dose-optimization program which includes automated exposure control, adjustment of the mA and/or kV according to patient size and/or use of iterative reconstruction technique. COMPARISON:  12/07/2021 FINDINGS: Cardiovascular: No acute findings. Aortic and coronary atherosclerotic calcification incidentally noted. Mediastinum/Nodes: No significant change in 8 mm right paratracheal mediastinal lymph node, which is not considered pathologically enlarged. No masses or pathologically enlarged lymph nodes identified on this unenhanced exam. Lungs/Pleura: Spiculated nodule in the anterior right upper lobe is seen abutting the anterior chest wall. This measures 2.5 x 2.0 cm on image 60/8, compared to 1.9 x 1.4 cm previously. Is highly suspicious for primary bronchogenic carcinoma. No other suspicious pulmonary nodules or masses are identified. No evidence of pleural effusion. Upper Abdomen: 2.5 cm low-attenuation right adrenal mass is stable, consistent with benign adenoma. Musculoskeletal: No suspicious bone lesions. Stable fluid collection seen in the right lateral breast, most consistent with postop seroma or lymphocele. IMPRESSION: Increased size of spiculated nodule in anterior right upper lobe, consistent with primary bronchogenic carcinoma. No evidence of metastatic disease. Stable benign right adrenal adenoma. Aortic Atherosclerosis (ICD10-I70.0). Electronically Signed   By: Marlaine Hind M.D.   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  PLAN:    1.  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  She was also noted to have 1 of 33 lymph  nodes positive for disease.  This increased her to a stage IIIa.  Have recommended adjuvant chemotherapy using cisplatin plus Taxotere every 3 weeks for 4 cycles with Udenyca support.  Patient has an MRI of her brain scheduled next week to complete the staging work-up.  Return to clinic on April 20, 2022 for further evaluation and consideration of cycle 1.   2. DCIS, right breast: Patient underwent lumpectomy followed by adjuvant XRT completing in September 2019.  Because there was no invasive component on her pathology, she did not require adjuvant chemotherapy. Patient could not tolerate tamoxifen or letrozole secondary to worsening joint pain, therefore was switched to anastrozole.  She is tolerating this well and will complete 5 years of treatment in September 2024.  Her most recent mammogram on April 02, 2021 was reported as normal.  Repeat in July 2023.    3.  Osteopenia: Patient's most recent bone mineral density on March 25, 2021 revealed a T score of -2.3 which is slightly worse than previous where her T score was reported -1.9 and -1.4 and years prior.  Continue calcium, vitamin D, and alendronate.     I spent a total of 30 minutes reviewing chart data, face-to-face evaluation with the patient, counseling and coordination of care as detailed above.     Lloyd Huger, MD 04/08/2022 3:14 PM   Cancer Staging  Ductal carcinoma in situ (DCIS) of right breast Staging form: Breast, AJCC 8th Edition - Clinical: Stage 0 (cTis (DCIS), cN0, cM0, ER+, PR-, HER2-) - Signed by Lloyd Huger, MD on 03/18/2018 Nuclear grade: G3 Laterality: Right  Squamous cell carcinoma of right lung ([place]) Staging form: Lung, AJCC 8th Edition - Pathologic stage from 04/07/2022: Stage IIIA (pT3, pN1, cM0) - Signed by Lloyd Huger, MD on 04/07/2022

## 2022-04-08 ENCOUNTER — Other Ambulatory Visit: Payer: Self-pay

## 2022-04-08 ENCOUNTER — Inpatient Hospital Stay: Payer: Medicare HMO

## 2022-04-08 ENCOUNTER — Encounter: Payer: Self-pay | Admitting: Oncology

## 2022-04-08 MED ORDER — ONDANSETRON HCL 8 MG PO TABS: 8.0000 mg | ORAL_TABLET | Freq: Two times a day (BID) | ORAL | 1 refills | Status: DC | PRN

## 2022-04-08 MED ORDER — PROCHLORPERAZINE MALEATE 10 MG PO TABS: 10.0000 mg | ORAL_TABLET | Freq: Four times a day (QID) | ORAL | 1 refills | Status: DC | PRN

## 2022-04-11 ENCOUNTER — Encounter: Payer: Self-pay | Admitting: *Deleted

## 2022-04-11 NOTE — Progress Notes (Signed)
Appt/treatment plan reviewed. Will follow up with patient next week when starts chemotherapy to assess for any further navigational needs.

## 2022-04-12 ENCOUNTER — Telehealth: Payer: Self-pay

## 2022-04-12 ENCOUNTER — Other Ambulatory Visit: Payer: Self-pay | Admitting: *Deleted

## 2022-04-12 MED ORDER — ANASTROZOLE 1 MG PO TABS: 1.0000 mg | ORAL_TABLET | Freq: Every day | ORAL | 0 refills | Status: DC

## 2022-04-12 NOTE — Telephone Encounter (Signed)
Error

## 2022-04-13 ENCOUNTER — Encounter: Payer: Self-pay | Admitting: Oncology

## 2022-04-13 NOTE — Telephone Encounter (Signed)
Telephone Encounter opened in error

## 2022-04-14 ENCOUNTER — Ambulatory Visit: Admission: RE | Admit: 2022-04-14 | Payer: Medicare HMO | Source: Ambulatory Visit

## 2022-04-14 NOTE — Progress Notes (Signed)
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  INTERVAL HISTORY: Patient returns to clinic today for further evaluation and initiation of cycle 1 of 4 of Taxotere and cisplatin.  She is anxious, but otherwise feels well.  She has no neurologic complaints.  She denies any recent fevers or illnesses. She has a good appetite and denies weight loss.  She denies any chest pain, shortness of breath, cough, or hemoptysis.  She denies any nausea, vomiting, constipation, or diarrhea. She has no urinary complaints.  Patient offers no further specific complaints today.  REVIEW OF SYSTEMS:   Review of Systems  Constitutional: Negative.  Negative for fever, malaise/fatigue and weight loss.  Respiratory: Negative.  Negative for cough, hemoptysis and shortness of breath.   Cardiovascular: Negative.  Negative for chest pain and leg swelling.  Gastrointestinal: Negative.  Negative for abdominal pain.  Genitourinary: Negative.  Negative for dysuria.  Musculoskeletal: Negative.  Negative for back pain and myalgias.  Skin: Negative.  Negative for rash.  Neurological: Negative.  Negative for dizziness, sensory change, focal weakness and weakness.  Psychiatric/Behavioral:  The patient is nervous/anxious.     As per HPI. Otherwise, a complete review of systems is negative.  PAST MEDICAL HISTORY: Past Medical History:  Diagnosis Date   Adenoma of right adrenal gland 12/07/2021   a.) CT chest 12/07/2021; measured 2.3 cm; not FDG avid on 01/06/2022 PET CT.   Anxiety    a.) uses BZO (alprazolam) PRN   Aortic  atherosclerosis (HCC)    Arthritis    Bladder mass 12/07/2021   a.) CT abd 12/07/2021 --> 9 x 8 mm nodular enhancing focus at the base of the bladder; suspicious for urothelial neoplasm.   COPD (chronic obstructive pulmonary disease) (HCC)    Coronary artery disease 10/16/2009   a.) LHC/PCI 10/16/2009: EF 60%; 50% OM1, 99% dRCA (3.0 x 18 mm Xience V DES). b.) LHC 07/31/2014: EF 65%; 40% pLAD, 20% pLCx, 40% OM1, 30% oRCA, 50% pRCA, 20% mRCA, 10% ISR stent to dRCA; further intervention deferred opting for med. mgmt.   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 b.) s/p lumpectomy, adjuvant XT; current on extended AI (anastrozole) therapy   GERD (gastroesophageal reflux disease)    HLD (hyperlipidemia)    Left thyroid nodule 12/07/2021   a.) CT chest; measured 2.6 cm   Long term current use of immunosuppressive drug    a.) MTX for RA.   OSA on CPAP    Osteopenia    Personal history of radiation therapy    Pneumonia    PRES (posterior reversible encephalopathy syndrome) 07/14/2014   Pulmonary nodules 11/08/2021   a.) CSR 11/08/2021: 92m nodular density RUL. b.) CT chest 12/07/2021: 1.9 x 1.4 x 2.5 spiculated lesion anterior RUL. c.) PET CT 01/06/2022: 2.2 x 1.9 spiculated anterior RUL mass (SUV max 16.4)   Rheumatoid arthritis ([facility]    a.) on MTX   Seasonal allergies    Seizures [place]   a.) x 1 episode; etiology  never determined.   Sinus headache    ST elevation myocardial infarction (STEMI) of inferior wall ([place]) 10/16/2009   a.) LHC/PCI 10/16/2009: EF 60%; 50% OM1, 99% dRCA (3.0 x 18 mm Xience V DES).   Tubular adenoma    Valvular heart disease     PAST SURGICAL HISTORY: Past Surgical History:  Procedure Laterality Date   ABDOMINAL HYSTERECTOMY     ANTERIOR CERVICAL DECOMP/DISCECTOMY FUSION N/A 01/20/2014   Procedure: ANTERIOR CERVICAL DECOMPRESSION/DISCECTOMY FUSION 2 LEVELS  cervical five/six six Tarry Kos;  Surgeon: Ophelia Charter, MD;  Location: [facility];  Service: Neurosurgery;  Laterality: N/A;   APPENDECTOMY     BACK SURGERY     lumbar   BREAST BIOPSY Right 02/01/2018   x shape, DCIS    BREAST BIOPSY  01/16/2019   coil, BENIGN MAMMARY TISSUE WITH MODERATE STROMAL FIBROSIS AND COARSE DYSTROPHIC CALCIFICATIONS of the RIGHT breast   BREAST LUMPECTOMY Right 03/07/2018   Procedure: BREAST LUMPECTOMY/ RE EXCISION;  Surgeon: Herbert Pun, MD;  Location: [facility];  Service: General;  Laterality: Right;   CARDIAC CATHETERIZATION Left 07/31/2014   Procedure: CARDIAC CATHETERIZATION; Location: [facility]; Surgeon: Serafina Royals, MD   COLONOSCOPY WITH PROPOFOL N/A 02/11/2021   Procedure: COLONOSCOPY WITH PROPOFOL;  Surgeon: Lucilla Lame, MD;  Location: [facility];  Service: Endoscopy;  Laterality: N/A;  Requests Early   CORONARY ANGIOPLASTY WITH STENT PLACEMENT Left 10/16/2009   Procedure: CORONARY ANGIOPLASTY WITH STENT PLACEMENT; Location: [facility]; Surgeon: Katrine Coho, MD   INTERCOSTAL NERVE BLOCK Right 03/17/2022   Procedure: INTERCOSTAL NERVE BLOCK;  Surgeon: Melrose Nakayama, MD;  Location: [facility];  Service: Thoracic;  Laterality: Right;   LASIK Bilateral    LYMPH NODE DISSECTION Right 03/17/2022   Procedure: LYMPH NODE DISSECTION;  Surgeon: Melrose Nakayama, MD;  Location: [facility];  Service: Thoracic;  Laterality: Right;   PARTIAL MASTECTOMY WITH NEEDLE LOCALIZATION Right 02/21/2018   Procedure: PARTIAL MASTECTOMY WITH NEEDLE LOCALIZATION;  Surgeon: Herbert Pun, MD;  Location: [facility];  Service: General;  Laterality: Right;   POLYPECTOMY N/A 02/11/2021   Procedure: POLYPECTOMY;  Surgeon: Lucilla Lame, MD;  Location: [facility];  Service: Endoscopy;  Laterality: N/A;   ROTATOR CUFF REPAIR Left    SHOULDER ARTHROSCOPY WITH ROTATOR CUFF REPAIR AND OPEN BICEPS TENODESIS Right 08/07/2019   Procedure: SHOULDER ARTHROSCOPY  WITH DEBRIDEMENT, DECOMPRESSION, ROTATOR CUFF REPAIR AND BICEPS TENODESIS. - RNFA;  Surgeon: Corky Mull, MD;  Location: [facility];  Service: Orthopedics;  Laterality: Right;   TOE SURGERY Right    has pin and plate in it   VIDEO BRONCHOSCOPY WITH ENDOBRONCHIAL ULTRASOUND N/A 03/02/2022   Procedure: VIDEO BRONCHOSCOPY WITH ENDOBRONCHIAL ULTRASOUND;  Surgeon: Tyler Pita, MD;  Location: [facility];  Service: Cardiopulmonary;  Laterality: N/A;    FAMILY HISTORY: Family History  Problem Relation Age of Onset   Breast cancer Cousin 64       bilateral at 90 and 19; daughter of maternal aunt who was unaffected   Lung cancer Maternal Aunt        dx 26s; deceased 11s; smoker   Heart attack Father        deceased 29   Stomach cancer Maternal Grandmother     ADVANCED DIRECTIVES (Y/N):  N  HEALTH MAINTENANCE: Social History   Tobacco Use   Smoking status: Former    Packs/day: 1.00    Years: 30.00    Total pack years: 30.00    Types: Cigarettes  Quit date: 09/13/2015    Years since quitting: 6.6   Smokeless tobacco: Never  Vaping Use   Vaping Use: Never used  Substance Use Topics   Alcohol use: Yes    Comment: occasional   Drug use: No     Colonoscopy:  PAP:  Bone density:  Lipid panel:  Allergies  Allergen Reactions   Other     BANDAIDS-OF LEFT ON FOR AN EXTENDED PERIOD OF TIME  Patient states latex rubber gloves are ok    Current Outpatient Medications  Medication Sig Dispense Refill   albuterol (VENTOLIN HFA) 108 (90 Base) MCG/ACT inhaler Inhale 1-2 puffs into the lungs every 6 (six) hours as needed for wheezing or shortness of breath. 1 g 1   alendronate (FOSAMAX) 70 MG tablet Take 1 tablet (70 mg total) by mouth once a week. Take with a full glass of water on an empty stomach. Stand or sit upright for 1 hour after taking 12 tablet 1   ALPRAZolam (XANAX) 0.25 MG tablet Take 1 tablet (0.25 mg total) by mouth 2 (two) times daily as needed for anxiety. (Patient  taking differently: Take 0.25 mg by mouth at bedtime.) 60 tablet 1   anastrozole (ARIMIDEX) 1 MG tablet Take 1 tablet (1 mg total) by mouth at bedtime. 30 tablet 0   aspirin-acetaminophen-caffeine (EXCEDRIN MIGRAINE) 956-387-56 MG tablet Take by mouth every 6 (six) hours as needed for headache.     Budeson-Glycopyrrol-Formoterol (BREZTRI AEROSPHERE) 160-9-4.8 MCG/ACT AERO Inhale 2 puffs into the lungs in the morning and at bedtime. 10.7 g 11   Budeson-Glycopyrrol-Formoterol (BREZTRI AEROSPHERE) 160-9-4.8 MCG/ACT AERO Inhale 2 puffs into the lungs in the morning and at bedtime. 5.9 g 0   fluticasone (FLONASE) 50 MCG/ACT nasal spray Place 2 sprays into both nostrils daily as needed for allergies. 60 mL 1   folic acid (FOLVITE) 1 MG tablet Take 2 mg by mouth in the morning.     gabapentin (NEURONTIN) 300 MG capsule Take 1 capsule (300 mg total) by mouth 2 (two) times daily. 60 capsule 6   methotrexate (RHEUMATREX) 2.5 MG tablet Take 20 mg by mouth once a week.     montelukast (SINGULAIR) 10 MG tablet Take 1 tablet (10 mg total) by mouth at bedtime. 90 tablet 1   omeprazole (PRILOSEC) 40 MG capsule Take 1 capsule (40 mg total) by mouth daily. 30 capsule 3   potassium chloride (KLOR-CON M) 10 MEQ tablet Take 1 tablet (10 mEq total) by mouth daily. 5 tablet 0   potassium chloride (KLOR-CON) 10 MEQ tablet TAKE (1) TABLET BY MOUTH EVERY DAY 90 tablet 1   rosuvastatin (CRESTOR) 40 MG tablet Take 40 mg by mouth every evening.     sertraline (ZOLOFT) 100 MG tablet Take 1 tablet (100 mg total) by mouth daily. 30 tablet 3   traMADol (ULTRAM) 50 MG tablet TAKE (1) TABLET BY MOUTH THREE TIMES A DAY AS NEEDED     ondansetron (ZOFRAN) 8 MG tablet Take 1 tablet (8 mg total) by mouth 2 (two) times daily as needed (Nausea or vomiting). Begin on the third day after cisplatin chemotherapy. (Patient not taking: Reported on 04/19/2022) 60 tablet 1   prochlorperazine (COMPAZINE) 10 MG tablet Take 1 tablet (10 mg total) by  mouth every 6 (six) hours as needed (Nausea or vomiting). (Patient not taking: Reported on 04/19/2022) 60 tablet 1   No current facility-administered medications for this visit.    OBJECTIVE: Vitals:   04/19/22 0854  BP: 118/64  Pulse: 85  Resp: 16  Temp: 98.8 F (37.1 C)  SpO2: 96%     Body mass index is 24.24 kg/m.    ECOG FS:0 - Asymptomatic  General: Well-developed, well-nourished, no acute distress. Eyes: Pink conjunctiva, anicteric sclera. HEENT: Normocephalic, moist mucous membranes. Lungs: No audible wheezing or coughing. Heart: Regular rate and rhythm. Abdomen: Soft, nontender, no obvious distention. Musculoskeletal: No edema, cyanosis, or clubbing. Neuro: Alert, answering all questions appropriately. Cranial nerves grossly intact. Skin: No rashes or petechiae noted. Psych: Normal affect.  LAB RESULTS:  Lab Results  Component Value Date   NA 141 04/19/2022   K 3.9 04/19/2022   CL 105 04/19/2022   CO2 27 04/19/2022   GLUCOSE 124 (H) 04/19/2022   BUN 8 04/19/2022   CREATININE 0.63 04/19/2022   CALCIUM 8.8 (L) 04/19/2022   PROT 7.4 04/19/2022   ALBUMIN 3.3 (L) 04/19/2022   AST 21 04/19/2022   ALT 20 04/19/2022   ALKPHOS 87 04/19/2022   BILITOT 0.3 04/19/2022   GFRNONAA >60 04/19/2022   GFRAA >60 04/09/2020    Lab Results  Component Value Date   WBC 7.7 04/19/2022   NEUTROABS 4.2 04/19/2022   HGB 11.9 (L) 04/19/2022   HCT 38.6 04/19/2022   MCV 94.4 04/19/2022   PLT 236 04/19/2022     STUDIES: DG Chest 2 View  Result Date: 04/05/2022 CLINICAL DATA:  Lung cancer EXAM: CHEST - 2 VIEW COMPARISON:  03/22/2022 FINDINGS: Postsurgical changes again noted on the right. Previously seen air-fluid level in the right upper hemithorax no longer visualized. Right basilar scarring or atelectasis. Left lung clear. Heart is normal size. No acute bony abnormality. IMPRESSION: Postoperative changes on the right. Right upper lobe lung air-fluid level no longer  visualized. Electronically Signed   By: Rolm Baptise M.D.   On: 04/05/2022 12:15   DG Chest 2 View  Result Date: 03/22/2022 CLINICAL DATA:  Status post lobectomy of lung. EXAM: CHEST - 2 VIEW COMPARISON:  Chest two views 03/21/2022 trauma CT chest 03/10/2022 FINDINGS: Cardiac silhouette and mediastinal contours are within normal limits. Mild calcification within aortic arch. Postsurgical changes are again seen of right upper lobectomy. Air-fluid level is again seen within the anterior superior right hemithorax status post lobectomy, as expected. No significant change. No evidence of increasing pleural air. Small right costophrenic angle pleural effusion. Mild right basilar likely subsegmental atelectasis. Left lung is clear. ACDF hardware is noted. Minimal partial visualization of lumbar spine fusion hardware. IMPRESSION: No significant change in right upper lobectomy postsurgical change with expected air-fluid level. No increasing pleural air or unexpected finding. Electronically Signed   By: Yvonne Kendall M.D.   On: 03/22/2022 08:10   DG Chest 2 View  Result Date: 03/21/2022 CLINICAL DATA:  Follow-up lobectomy EXAM: CHEST - 2 VIEW COMPARISON:  03/20/2022 a FINDINGS: Artifact overlies the chest. Heart size is normal. Aortic atherosclerosis is present. There is been lobectomy on the right. Air-fluid level in the post lobectomy space as expected. No evidence of increasing pleural air. IMPRESSION: Post lobectomy air-fluid level on the right as expected. No increasing pleural air or unexpected finding. Electronically Signed   By: Nelson Chimes M.D.   On: 03/21/2022 08:06   DG Chest 1V REPEAT Same Day  Result Date: 03/20/2022 CLINICAL DATA:  Chest tube removal. EXAM: CHEST - 1 VIEW SAME DAY COMPARISON:  Chest x-ray 03/20/2022 FINDINGS: Right chest tube has been removed. There is no pneumothorax. Multifocal airspace disease most significant in the right lower  lung and left upper lobe appears unchanged. No  pleural effusion. Cardiomediastinal silhouette is stable. Osseous structures are unchanged. Right chest wall emphysema has decreased. IMPRESSION: 1. Right chest tube removal. 2. No pneumothorax. 3. Stable multifocal airspace disease. Electronically Signed   By: Ronney Asters M.D.   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  PLAN:    1.  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  She was also noted to have 1 of 33 lymph nodes positive for disease.  This increased her to a stage IIIa.  Have recommended adjuvant chemotherapy using cisplatin plus Taxotere every 3 weeks for 4 cycles with Udenyca support.  MRI of the brain to complete the staging workup is scheduled for April 21, 2022.  Return to clinic tomorrow for cycle 1 of Taxotere and cisplatin.  She will then return to clinic 2 days later for [facility], in 1 week for laboratory work and further evaluation, and then in 3 weeks for further evaluation and consideration of cycle 2.   2. DCIS, right breast: Patient underwent lumpectomy followed by adjuvant XRT completing in September 2019.  Because there was no invasive component on her pathology, she did not require adjuvant chemotherapy. Patient could not tolerate tamoxifen or letrozole secondary to worsening joint pain, therefore was switched to anastrozole.  She is tolerating this well and will complete 5 years of treatment in September 2024.  Her most recent mammogram on March 11, 2022 was reported BI-RADS 1.  Repeat in June 2024. 3.  Osteopenia: Patient's most recent bone mineral density on March 25, 2021 revealed a T score of -2.3 which is slightly worse than previous where her T score was reported -1.9 and -1.4 and years prior.  Continue calcium,  vitamin D, and alendronate.     I spent a total of 30 minutes reviewing chart data, face-to-face evaluation with the patient, counseling and coordination of care as detailed above.      Lloyd Huger, MD 04/19/2022 2:21 PM   Cancer Staging  Ductal carcinoma in situ (DCIS) of right breast Staging form: Breast, AJCC 8th Edition - Clinical: Stage 0 (cTis (DCIS), cN0, cM0, ER+, PR-, HER2-) - Signed by Lloyd Huger, MD on 03/18/2018 Nuclear grade: G3 Laterality: Right  Squamous cell carcinoma of right lung ([place]) Staging form: Lung, AJCC 8th Edition - Pathologic stage from 04/07/2022: Stage IIIA (pT3, pN1, cM0) - Signed by Lloyd Huger, MD on 04/07/2022

## 2022-04-18 ENCOUNTER — Inpatient Hospital Stay: Payer: Medicare HMO | Attending: Oncology

## 2022-04-18 DIAGNOSIS — M8580 Other specified disorders of bone density and structure, unspecified site: Secondary | ICD-10-CM | POA: Insufficient documentation

## 2022-04-18 DIAGNOSIS — G8928 Other chronic postprocedural pain: Secondary | ICD-10-CM | POA: Insufficient documentation

## 2022-04-18 DIAGNOSIS — R5383 Other fatigue: Secondary | ICD-10-CM | POA: Insufficient documentation

## 2022-04-18 DIAGNOSIS — M069 Rheumatoid arthritis, unspecified: Secondary | ICD-10-CM | POA: Insufficient documentation

## 2022-04-18 DIAGNOSIS — Z87891 Personal history of nicotine dependence: Secondary | ICD-10-CM | POA: Insufficient documentation

## 2022-04-18 DIAGNOSIS — D0511 Intraductal carcinoma in situ of right breast: Secondary | ICD-10-CM | POA: Insufficient documentation

## 2022-04-18 DIAGNOSIS — Z79811 Long term (current) use of aromatase inhibitors: Secondary | ICD-10-CM | POA: Insufficient documentation

## 2022-04-18 DIAGNOSIS — Z79899 Other long term (current) drug therapy: Secondary | ICD-10-CM | POA: Insufficient documentation

## 2022-04-18 DIAGNOSIS — Z5189 Encounter for other specified aftercare: Secondary | ICD-10-CM | POA: Insufficient documentation

## 2022-04-18 DIAGNOSIS — Z5111 Encounter for antineoplastic chemotherapy: Secondary | ICD-10-CM | POA: Insufficient documentation

## 2022-04-18 DIAGNOSIS — Z902 Acquired absence of lung [part of]: Secondary | ICD-10-CM | POA: Insufficient documentation

## 2022-04-18 DIAGNOSIS — R52 Pain, unspecified: Secondary | ICD-10-CM | POA: Insufficient documentation

## 2022-04-18 DIAGNOSIS — C3411 Malignant neoplasm of upper lobe, right bronchus or lung: Secondary | ICD-10-CM | POA: Insufficient documentation

## 2022-04-19 ENCOUNTER — Inpatient Hospital Stay: Payer: Medicare HMO

## 2022-04-19 ENCOUNTER — Inpatient Hospital Stay (HOSPITAL_BASED_OUTPATIENT_CLINIC_OR_DEPARTMENT_OTHER): Payer: Medicare HMO | Admitting: Oncology

## 2022-04-19 ENCOUNTER — Encounter: Payer: Self-pay | Admitting: Oncology

## 2022-04-19 VITALS — BP 118/64 | HR 85 | Temp 98.8°F | Resp 16 | Wt 141.2 lb

## 2022-04-19 DIAGNOSIS — M8580 Other specified disorders of bone density and structure, unspecified site: Secondary | ICD-10-CM | POA: Diagnosis not present

## 2022-04-19 DIAGNOSIS — G8928 Other chronic postprocedural pain: Secondary | ICD-10-CM | POA: Diagnosis not present

## 2022-04-19 DIAGNOSIS — C3491 Malignant neoplasm of unspecified part of right bronchus or lung: Secondary | ICD-10-CM

## 2022-04-19 DIAGNOSIS — Z5111 Encounter for antineoplastic chemotherapy: Secondary | ICD-10-CM | POA: Diagnosis not present

## 2022-04-19 DIAGNOSIS — Z902 Acquired absence of lung [part of]: Secondary | ICD-10-CM | POA: Diagnosis not present

## 2022-04-19 DIAGNOSIS — Z5189 Encounter for other specified aftercare: Secondary | ICD-10-CM | POA: Diagnosis not present

## 2022-04-19 DIAGNOSIS — Z79899 Other long term (current) drug therapy: Secondary | ICD-10-CM | POA: Diagnosis not present

## 2022-04-19 DIAGNOSIS — Z79811 Long term (current) use of aromatase inhibitors: Secondary | ICD-10-CM | POA: Diagnosis not present

## 2022-04-19 DIAGNOSIS — Z87891 Personal history of nicotine dependence: Secondary | ICD-10-CM | POA: Diagnosis not present

## 2022-04-19 DIAGNOSIS — M069 Rheumatoid arthritis, unspecified: Secondary | ICD-10-CM | POA: Diagnosis not present

## 2022-04-19 DIAGNOSIS — C3411 Malignant neoplasm of upper lobe, right bronchus or lung: Secondary | ICD-10-CM | POA: Diagnosis not present

## 2022-04-19 DIAGNOSIS — R52 Pain, unspecified: Secondary | ICD-10-CM | POA: Diagnosis not present

## 2022-04-19 DIAGNOSIS — D0511 Intraductal carcinoma in situ of right breast: Secondary | ICD-10-CM | POA: Diagnosis not present

## 2022-04-19 DIAGNOSIS — R5383 Other fatigue: Secondary | ICD-10-CM | POA: Diagnosis not present

## 2022-04-19 LAB — CBC WITH DIFFERENTIAL/PLATELET
Abs Immature Granulocytes: 0.02 10*3/uL (ref 0.00–0.07)
Basophils Absolute: 0.1 10*3/uL (ref 0.0–0.1)
Basophils Relative: 1 %
Eosinophils Absolute: 0.6 10*3/uL — ABNORMAL HIGH (ref 0.0–0.5)
Eosinophils Relative: 7 %
HCT: 38.6 % (ref 36.0–46.0)
Hemoglobin: 11.9 g/dL — ABNORMAL LOW (ref 12.0–15.0)
Immature Granulocytes: 0 %
Lymphocytes Relative: 24 %
Lymphs Abs: 1.8 10*3/uL (ref 0.7–4.0)
MCH: 29.1 pg (ref 26.0–34.0)
MCHC: 30.8 g/dL (ref 30.0–36.0)
MCV: 94.4 fL (ref 80.0–100.0)
Monocytes Absolute: 1 10*3/uL (ref 0.1–1.0)
Monocytes Relative: 13 %
Neutro Abs: 4.2 10*3/uL (ref 1.7–7.7)
Neutrophils Relative %: 55 %
Platelets: 236 10*3/uL (ref 150–400)
RBC: 4.09 MIL/uL (ref 3.87–5.11)
RDW: 13.9 % (ref 11.5–15.5)
WBC: 7.7 10*3/uL (ref 4.0–10.5)
nRBC: 0 % (ref 0.0–0.2)

## 2022-04-19 LAB — COMPREHENSIVE METABOLIC PANEL
ALT: 20 U/L (ref 0–44)
AST: 21 U/L (ref 15–41)
Albumin: 3.3 g/dL — ABNORMAL LOW (ref 3.5–5.0)
Alkaline Phosphatase: 87 U/L (ref 38–126)
Anion gap: 9 (ref 5–15)
BUN: 8 mg/dL (ref 8–23)
CO2: 27 mmol/L (ref 22–32)
Calcium: 8.8 mg/dL — ABNORMAL LOW (ref 8.9–10.3)
Chloride: 105 mmol/L (ref 98–111)
Creatinine, Ser: 0.63 mg/dL (ref 0.44–1.00)
GFR, Estimated: >60 mL/min (ref >60)
Glucose, Bld: 124 mg/dL — ABNORMAL HIGH (ref 70–99)
Potassium: 3.9 mmol/L (ref 3.5–5.1)
Sodium: 141 mmol/L (ref 135–145)
Total Bilirubin: 0.3 mg/dL (ref 0.3–1.2)
Total Protein: 7.4 g/dL (ref 6.5–8.1)

## 2022-04-19 LAB — MAGNESIUM: Magnesium: 1.9 mg/dL (ref 1.7–2.4)

## 2022-04-19 MED FILL — Fosaprepitant Dimeglumine For IV Infusion 150 MG (Base Eq): INTRAVENOUS | Qty: 5 | Status: AC

## 2022-04-19 MED FILL — Dexamethasone Sodium Phosphate Inj 100 MG/10ML: INTRAMUSCULAR | Qty: 1 | Status: AC

## 2022-04-20 ENCOUNTER — Encounter: Payer: Self-pay | Admitting: *Deleted

## 2022-04-20 ENCOUNTER — Inpatient Hospital Stay: Payer: Medicare HMO

## 2022-04-20 ENCOUNTER — Telehealth: Payer: Self-pay | Admitting: Urology

## 2022-04-20 VITALS — BP 124/67 | HR 80 | Temp 98.1°F | Resp 20

## 2022-04-20 DIAGNOSIS — G8928 Other chronic postprocedural pain: Secondary | ICD-10-CM | POA: Diagnosis not present

## 2022-04-20 DIAGNOSIS — R52 Pain, unspecified: Secondary | ICD-10-CM | POA: Diagnosis not present

## 2022-04-20 DIAGNOSIS — Z902 Acquired absence of lung [part of]: Secondary | ICD-10-CM | POA: Diagnosis not present

## 2022-04-20 DIAGNOSIS — C3491 Malignant neoplasm of unspecified part of right bronchus or lung: Secondary | ICD-10-CM

## 2022-04-20 DIAGNOSIS — R5383 Other fatigue: Secondary | ICD-10-CM | POA: Diagnosis not present

## 2022-04-20 DIAGNOSIS — Z79811 Long term (current) use of aromatase inhibitors: Secondary | ICD-10-CM | POA: Diagnosis not present

## 2022-04-20 DIAGNOSIS — Z5111 Encounter for antineoplastic chemotherapy: Secondary | ICD-10-CM | POA: Diagnosis not present

## 2022-04-20 DIAGNOSIS — D0511 Intraductal carcinoma in situ of right breast: Secondary | ICD-10-CM | POA: Diagnosis not present

## 2022-04-20 DIAGNOSIS — C3411 Malignant neoplasm of upper lobe, right bronchus or lung: Secondary | ICD-10-CM | POA: Diagnosis not present

## 2022-04-20 DIAGNOSIS — Z5189 Encounter for other specified aftercare: Secondary | ICD-10-CM | POA: Diagnosis not present

## 2022-04-20 MED ORDER — SODIUM CHLORIDE 0.9 % IV SOLN
150.0000 mg | Freq: Once | INTRAVENOUS | Status: AC
  Administered 2022-04-20: 150 mg via INTRAVENOUS
  Filled 2022-04-20: qty 150

## 2022-04-20 MED ORDER — MAGNESIUM SULFATE 2 GM/50ML IV SOLN
2.0000 g | Freq: Once | INTRAVENOUS | Status: AC
  Administered 2022-04-20: 2 g via INTRAVENOUS

## 2022-04-20 MED ORDER — SODIUM CHLORIDE 0.9 % IV SOLN
10.0000 mg | Freq: Once | INTRAVENOUS | Status: AC
  Administered 2022-04-20: 10 mg via INTRAVENOUS
  Filled 2022-04-20: qty 10

## 2022-04-20 MED ORDER — SODIUM CHLORIDE 0.9 % IV SOLN
75.0000 mg/m2 | Freq: Once | INTRAVENOUS | Status: AC
  Administered 2022-04-20: 127 mg via INTRAVENOUS
  Filled 2022-04-20: qty 50

## 2022-04-20 MED ORDER — POTASSIUM CHLORIDE IN NACL 20-0.9 MEQ/L-% IV SOLN
Freq: Once | INTRAVENOUS | Status: AC
  Filled 2022-04-20: qty 1000

## 2022-04-20 MED ORDER — SODIUM CHLORIDE 0.9 % IV SOLN
75.0000 mg/m2 | Freq: Once | INTRAVENOUS | Status: AC
  Administered 2022-04-20: 130 mg via INTRAVENOUS
  Filled 2022-04-20: qty 13

## 2022-04-20 MED ORDER — SODIUM CHLORIDE 0.9 % IV SOLN
Freq: Once | INTRAVENOUS | Status: AC
  Filled 2022-04-20: qty 250

## 2022-04-20 MED ORDER — PALONOSETRON HCL INJECTION 0.25 MG/5ML
0.2500 mg | Freq: Once | INTRAVENOUS | Status: AC
  Administered 2022-04-20: 0.25 mg via INTRAVENOUS

## 2022-04-20 NOTE — Patient Instructions (Signed)
[facility] AT [place]  Discharge Instructions: Thank you for choosing [facility] to provide your oncology and hematology care.  If you have a lab appointment with the [facility], please go directly to the [facility] and check in at the registration area.  Wear comfortable clothing and clothing appropriate for easy access to any Portacath or PICC line.   We strive to give you quality time with your provider. You may need to reschedule your appointment if you arrive late (15 or more minutes).  Arriving late affects you and other patients whose appointments are after yours.  Also, if you miss three or more appointments without notifying the office, you may be dismissed from the clinic at the provider's discretion.      For prescription refill requests, have your pharmacy contact our office and allow 72 hours for refills to be completed.    Today you received the following chemotherapy and/or immunotherapy agents: Taxotere, Cisplatin      To help prevent nausea and vomiting after your treatment, we encourage you to take your nausea medication as directed.  BELOW ARE SYMPTOMS THAT SHOULD BE REPORTED IMMEDIATELY: *FEVER GREATER THAN 100.4 F (38 C) OR HIGHER *CHILLS OR SWEATING *NAUSEA AND VOMITING THAT IS NOT CONTROLLED WITH YOUR NAUSEA MEDICATION *UNUSUAL SHORTNESS OF BREATH *UNUSUAL BRUISING OR BLEEDING *URINARY PROBLEMS (pain or burning when urinating, or frequent urination) *BOWEL PROBLEMS (unusual diarrhea, constipation, pain near the anus) TENDERNESS IN MOUTH AND THROAT WITH OR WITHOUT PRESENCE OF ULCERS (sore throat, sores in mouth, or a toothache) UNUSUAL RASH, SWELLING OR PAIN  UNUSUAL VAGINAL DISCHARGE OR ITCHING   Items with * indicate a potential emergency and should be followed up as soon as possible or go to the Emergency Department if any problems should occur.  Please show the CHEMOTHERAPY ALERT CARD or IMMUNOTHERAPY ALERT CARD at  check-in to the Emergency Department and triage nurse.  Should you have questions after your visit or need to cancel or reschedule your appointment, please contact [facility]CER [place] AT [place]  [phone] and follow the prompts.  Office hours are 8:00 a.m. to 4:30 p.m. Monday - Friday. Please note that voicemails left after 4:00 p.m. may not be returned until the following business day.  We are closed weekends and major holidays. You have access to a nurse at all times for urgent questions. Please call the main number to the clinic [phone] and follow the prompts.  For any non-urgent questions, you may also contact your provider using MyChart. We now offer e-Visits for anyone 46 and older to request care online for non-urgent symptoms. For details visit [email or web address].   Also download the MyChart app! Go to the app store, search "MyChart", open the app, select [facility], and log in with your MyChart username and password.  Masks are optional in the cancer centers. If you would like for your care team to wear a mask while they are taking care of you, please let them know. For doctor visits, patients may have with them one support person who is at least 65 years old. At this time, visitors are not allowed in the infusion area.

## 2022-04-20 NOTE — Telephone Encounter (Signed)
Pt called and wants to know what is going on with the upcoming surgery.  Should she keep the 8/11 appt?  Please advise [phone]

## 2022-04-20 NOTE — Progress Notes (Signed)
Nutrition Assessment   Reason for Assessment:  Referral from [facility], starting chemotherapy   ASSESSMENT:  66 year old female with stage III SCC right lung cancer.  S/p right upper lobectomy, DCIS right breast 2019, COPD, GERD, HLD.  Patient receiving chemotherapy.   Met with patient during infusion today.  Patient reports that she has never been a breakfast eater and sometimes does not eat much for lunch either.  Supper is usually meat and 2-sides with bread.  Reports decreased appetite and taste for food.  Likes diet coke.     Medications: folic acid, prilosec, KCL, zofran   Labs: reviewed   Anthropometrics:   Height: 64 inches Weight: 141 lb 3.2 oz  on 8/8 Stable weight BMI: 24   NUTRITION DIAGNOSIS: Inadequate oral intake related to cancer and cancer related treatments as evidenced by decreased appetite and taste for food   INTERVENTION:  Discussed importance of nutrition during treatment and weight maintenance Encouraged good sources of protein at snack/meal time. Encouraged hydration.  Recipes given for flavored waters.   Contact information given   MONITORING, EVALUATION, GOAL: weight trends, intake   Next Visit: Wednesday, August 30 during infusion  Reshma Hoey B. Zenia Resides, [place], [place] Registered Dietitian [phone]

## 2022-04-21 ENCOUNTER — Ambulatory Visit
Admission: RE | Admit: 2022-04-21 | Discharge: 2022-04-21 | Disposition: A | Discharge status: Home / Self Care | Payer: Medicare HMO | Source: Ambulatory Visit | Attending: Thoracic Surgery (Cardiothoracic Vascular Surgery) | Admitting: Thoracic Surgery (Cardiothoracic Vascular Surgery)

## 2022-04-21 ENCOUNTER — Telehealth: Payer: Self-pay

## 2022-04-21 ENCOUNTER — Encounter: Payer: Self-pay | Admitting: Urology

## 2022-04-21 DIAGNOSIS — G319 Degenerative disease of nervous system, unspecified: Secondary | ICD-10-CM | POA: Diagnosis not present

## 2022-04-21 DIAGNOSIS — C3491 Malignant neoplasm of unspecified part of right bronchus or lung: Secondary | ICD-10-CM | POA: Diagnosis not present

## 2022-04-21 MED ORDER — GADOBENATE DIMEGLUMINE 529 MG/ML IV SOLN: 6.0000 mL | Freq: Once | INTRAVENOUS | Status: DC | PRN

## 2022-04-21 MED ORDER — GADOBUTROL 1 MMOL/ML IV SOLN
6.0000 mL | Freq: Once | INTRAVENOUS | Status: AC | PRN
  Administered 2022-04-21: 6 mL via INTRAVENOUS

## 2022-04-21 NOTE — Telephone Encounter (Signed)
Telephone call to patient for follow up after receiving first infusion.   Patient states infusion went great but if was "a long day".   States eating good and drinking plenty of fluids.   Denies any nausea or vomiting.  Encouraged patient to call for any concerns or questions.

## 2022-04-22 ENCOUNTER — Inpatient Hospital Stay: Payer: Medicare HMO

## 2022-04-22 ENCOUNTER — Encounter
Admit: 2022-04-22 | Discharge: 2022-04-22 | Disposition: A | Discharge status: Home / Self Care | Payer: Medicare HMO | Attending: Urology | Admitting: Urology

## 2022-04-22 DIAGNOSIS — D0511 Intraductal carcinoma in situ of right breast: Secondary | ICD-10-CM | POA: Diagnosis not present

## 2022-04-22 DIAGNOSIS — Z5189 Encounter for other specified aftercare: Secondary | ICD-10-CM | POA: Diagnosis not present

## 2022-04-22 DIAGNOSIS — G8928 Other chronic postprocedural pain: Secondary | ICD-10-CM | POA: Diagnosis not present

## 2022-04-22 DIAGNOSIS — C3491 Malignant neoplasm of unspecified part of right bronchus or lung: Secondary | ICD-10-CM

## 2022-04-22 DIAGNOSIS — Z79811 Long term (current) use of aromatase inhibitors: Secondary | ICD-10-CM | POA: Diagnosis not present

## 2022-04-22 DIAGNOSIS — C3411 Malignant neoplasm of upper lobe, right bronchus or lung: Secondary | ICD-10-CM | POA: Diagnosis not present

## 2022-04-22 DIAGNOSIS — R52 Pain, unspecified: Secondary | ICD-10-CM | POA: Diagnosis not present

## 2022-04-22 DIAGNOSIS — Z902 Acquired absence of lung [part of]: Secondary | ICD-10-CM | POA: Diagnosis not present

## 2022-04-22 DIAGNOSIS — Z5111 Encounter for antineoplastic chemotherapy: Secondary | ICD-10-CM | POA: Diagnosis not present

## 2022-04-22 DIAGNOSIS — R5383 Other fatigue: Secondary | ICD-10-CM | POA: Diagnosis not present

## 2022-04-22 HISTORY — DX: Essential (primary) hypertension: I10

## 2022-04-22 MED ORDER — PEGFILGRASTIM-CBQV 6 MG/0.6ML ~~LOC~~ SOSY
6.0000 mg | PREFILLED_SYRINGE | Freq: Once | SUBCUTANEOUS | Status: AC
  Administered 2022-04-22: 6 mg via SUBCUTANEOUS
  Filled 2022-04-22: qty 0.6

## 2022-04-22 NOTE — Patient Instructions (Addendum)
Your procedure is scheduled on: Monday, August 21 Report to the Registration Desk on the 1st floor of the [facility]. To find out your arrival time, please call [phone] between 1PM - 3PM on: Friday, August 18 If your arrival time is 6:00 am, do not arrive prior to that time as the [place] entrance doors do not open until 6:00 am.  REMEMBER: Instructions that are not followed completely may result in serious medical risk, up to and including death; or upon the discretion of your surgeon and anesthesiologist your surgery may need to be rescheduled.  Do not eat or drink after midnight the night before surgery.  No gum chewing, lozengers or hard candies.  TAKE THESE MEDICATIONS THE MORNING OF SURGERY WITH A SIP OF WATER:  Breztri inhaler Gabapentin Omeprazole - (take one the night before and one on the morning of surgery - helps to prevent nausea after surgery.) Sertraline  Use inhalers on the day of surgery and bring the albuterol inhaler to the hospital.  One week prior to surgery: starting August 14 Stop Anti-inflammatories (NSAIDS) such as Advil, Aleve, Ibuprofen, Motrin, Naproxen, Naprosyn and Aspirin based products such as Excedrin, Goodys Powder, BC Powder. Stop ANY OVER THE COUNTER supplements until after surgery. You may however, continue to take Tylenol if needed for pain up until the day of surgery.  No Alcohol for 24 hours before or after surgery.  No Smoking including e-cigarettes for 24 hours prior to surgery.  No chewable tobacco products for at least 6 hours prior to surgery.  No nicotine patches on the day of surgery.  Do not use any "recreational" drugs for at least a week prior to your surgery.  Please be advised that the combination of cocaine and anesthesia may have negative outcomes, up to and including death. If you test positive for cocaine, your surgery will be cancelled.  On the morning of surgery brush your teeth with toothpaste and water, you  may rinse your mouth with mouthwash if you wish. Do not swallow any toothpaste or mouthwash.  Do not wear jewelry, make-up, hairpins, clips or nail polish.  Do not wear lotions, powders, or perfumes.   Do not shave body from the neck down 48 hours prior to surgery just in case you cut yourself which could leave a site for infection.   Contact lenses, hearing aids and dentures may not be worn into surgery.  Do not bring valuables to the hospital. [facility] is not responsible for any missing/lost belongings or valuables.   Bring your C-PAP to the hospital with you in case you may have to spend the night.   Notify your doctor if there is any change in your medical condition (cold, fever, infection).  Wear comfortable clothing (specific to your surgery type) to the hospital.  After surgery, you can help prevent lung complications by doing breathing exercises.  Take deep breaths and cough every 1-2 hours. Your doctor may order a device called an Incentive Spirometer to help you take deep breaths.  If you are being discharged the day of surgery, you will not be allowed to drive home. You will need a responsible adult (18 years or older) to drive you home and stay with you that night.   If you are taking public transportation, you will need to have a responsible adult (18 years or older) with you. Please confirm with your physician that it is acceptable to use public transportation.   Please call the [facility]  at [phone] if you have any questions about these instructions.  Surgery Visitation Policy:  Patients undergoing a surgery or procedure may have two family members or support persons with them as long as the person is not COVID-19 positive or experiencing its symptoms.

## 2022-04-22 NOTE — Telephone Encounter (Signed)
Spoke with patient, she prefers not to have surgery at this time. Would like to know your opinion on delaying surgery?

## 2022-04-23 ENCOUNTER — Telehealth: Payer: Self-pay | Admitting: Oncology

## 2022-04-23 NOTE — Progress Notes (Unsigned)
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  INTERVAL HISTORY: Patient returns to clinic today for further evaluation and to assess her toleration of cycle 1 of Taxotere and cisplatin.  Patient has felt increased fatigue throughout the week.  She also claims she " aches" all over.  She continues to have tenderness at the site of her surgery.  She otherwise tolerated treatment well.  She has no neurologic complaints.  She denies any recent fevers or illnesses. She has a good appetite and denies weight loss.  She denies any chest pain, shortness of breath, cough, or hemoptysis.  She denies any nausea, vomiting, constipation, or diarrhea. She has no urinary complaints.  Patient offers no further specific complaints.  REVIEW OF SYSTEMS:   Review of Systems  Constitutional: Negative.  Negative for fever, malaise/fatigue and weight loss.  Respiratory: Negative.  Negative for cough, hemoptysis and shortness of breath.   Cardiovascular: Negative.  Negative for chest pain and leg swelling.  Gastrointestinal: Negative.  Negative for abdominal pain.  Genitourinary: Negative.  Negative for dysuria.  Musculoskeletal: Negative.  Negative for back pain and myalgias.  Skin: Negative.  Negative for rash.  Neurological: Negative.  Negative for dizziness, sensory change, focal weakness and weakness.  Psychiatric/Behavioral: Negative.  The patient is not nervous/anxious.     As per HPI. Otherwise, a complete review of systems is negative.  PAST MEDICAL HISTORY: Past Medical History:  Diagnosis Date    Adenoma of right adrenal gland 12/07/2021   a.) CT chest 12/07/2021; measured 2.3 cm; not FDG avid on 01/06/2022 PET CT.   Anxiety    a.) uses BZO (alprazolam) PRN   Aortic atherosclerosis (HCC)    Arthritis    Bladder mass 12/07/2021   a.) CT abd 12/07/2021 --> 9 x 8 mm nodular enhancing focus at the base of the bladder; suspicious for urothelial neoplasm.   COPD (chronic obstructive pulmonary disease) (HCC)    Coronary artery disease 10/16/2009   a.) LHC/PCI 10/16/2009: EF 60%; 50% OM1, 99% dRCA (3.0 x 18 mm Xience V DES). b.) LHC 07/31/2014: EF 65%; 40% pLAD, 20% pLCx, 40% OM1, 30% oRCA, 50% pRCA, 20% mRCA, 10% ISR stent to dRCA; further intervention deferred opting for med. mgmt.   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 b.) s/p lumpectomy, adjuvant XRT; currently on extended AI (anastrozole) therapy   Essential hypertension    GERD (gastroesophageal reflux disease)    HLD (hyperlipidemia)    Left thyroid nodule 12/07/2021   a.) CT chest; measured 2.6 cm   Long term current use of immunosuppressive drug    a.) MTX for RA.   OSA on CPAP    Osteopenia    Personal history of radiation therapy    Pneumonia    PRES (posterior reversible encephalopathy syndrome) 07/14/2014   Pulmonary nodules 11/08/2021   a.) CXR 11/08/2021: 35m nodular density RUL. b.) CT chest 12/07/2021: 1.9 x 1.4 x 2.5 spiculated lesion anterior RUL. c.) PET  CT 01/06/2022: 2.2 x 1.9 spiculated anterior RUL mass (SUV max 16.4); d.) Bx (+) NSCLC, favoring SCC with tumor necrosis; p40/CK7/GATA3 (+), TTF-1 (-)   Rheumatoid arthritis ([place])    a.) on MTX   Seasonal allergies    Seizures ([place]) 2014   a.) x 1 episode; etiology never determined.   Sinus headache    Squamous cell lung cancer, right ([place]) 03/17/2022   a.) RUL lobectomy 03/17/2022 --> Bx (+) for invasive moderately  differentiated SCC of the RIGHT upper lobe (PDL-1: 5%) --> stage IIIA SCC (pT3, pN1, cM0)   ST elevation myocardial infarction (STEMI) of inferior wall ([place]) 10/16/2009   a.) LHC/PCI 10/16/2009: EF 60%; 50% OM1, 99% dRCA (3.0 x 18 mm Xience V DES).   Tubular adenoma    Valvular heart disease     PAST SURGICAL HISTORY: Past Surgical History:  Procedure Laterality Date   ABDOMINAL HYSTERECTOMY     ANTERIOR CERVICAL DECOMP/DISCECTOMY FUSION N/A 01/20/2014   Procedure: ANTERIOR CERVICAL DECOMPRESSION/DISCECTOMY FUSION 2 LEVELS cervical five/six six Tarry Kos;  Surgeon: Ophelia Charter, MD;  Location: [facility];  Service: Neurosurgery;  Laterality: N/A;   APPENDECTOMY     BACK SURGERY     lumbar   BREAST BIOPSY Right 02/01/2018   x shape, DCIS    BREAST BIOPSY  01/16/2019   coil, BENIGN MAMMARY TISSUE WITH MODERATE STROMAL FIBROSIS AND COARSE DYSTROPHIC CALCIFICATIONS of the RIGHT breast   BREAST LUMPECTOMY Right 03/07/2018   Procedure: BREAST LUMPECTOMY/ RE EXCISION;  Surgeon: Herbert Pun, MD;  Location: [facility];  Service: General;  Laterality: Right;   CARDIAC CATHETERIZATION Left 07/31/2014   Procedure: CARDIAC CATHETERIZATION; Location: [facility]; Surgeon: Serafina Royals, MD   COLONOSCOPY WITH PROPOFOL N/A 02/11/2021   Procedure: COLONOSCOPY WITH PROPOFOL;  Surgeon: Lucilla Lame, MD;  Location: [facility];  Service: Endoscopy;  Laterality: N/A;  Requests Early   CORONARY ANGIOPLASTY WITH STENT PLACEMENT Left 10/16/2009   Procedure: CORONARY ANGIOPLASTY WITH STENT PLACEMENT; Location: [facility]; Surgeon: Katrine Coho, MD   INTERCOSTAL NERVE BLOCK Right 03/17/2022   Procedure: INTERCOSTAL NERVE BLOCK;  Surgeon: Melrose Nakayama, MD;  Location: [facility];  Service: Thoracic;  Laterality: Right;   LASIK Bilateral    LUNG LOBECTOMY Right 03/17/2022   right upper lobe   LYMPH NODE DISSECTION Right 03/17/2022   Procedure: LYMPH NODE DISSECTION;  Surgeon: Melrose Nakayama, MD;  Location: [facility];  Service: Thoracic;  Laterality: Right;   PARTIAL MASTECTOMY WITH NEEDLE LOCALIZATION Right 02/21/2018   Procedure: PARTIAL MASTECTOMY WITH NEEDLE LOCALIZATION;  Surgeon: Herbert Pun, MD;  Location: [facility];  Service: General;  Laterality: Right;   POLYPECTOMY N/A 02/11/2021   Procedure: POLYPECTOMY;  Surgeon: Lucilla Lame, MD;  Location: [facility];  Service: Endoscopy;  Laterality: N/A;   ROTATOR CUFF REPAIR Left    SHOULDER ARTHROSCOPY WITH ROTATOR CUFF REPAIR AND OPEN BICEPS TENODESIS Right 08/07/2019   Procedure: SHOULDER ARTHROSCOPY WITH DEBRIDEMENT, DECOMPRESSION, ROTATOR CUFF REPAIR AND BICEPS TENODESIS. - RNFA;  Surgeon: Corky Mull, MD;  Location: [facility];  Service: Orthopedics;  Laterality: Right;   TOE SURGERY Right    has pin and plate in it   VIDEO BRONCHOSCOPY WITH ENDOBRONCHIAL ULTRASOUND N/A 03/02/2022   Procedure: VIDEO BRONCHOSCOPY WITH ENDOBRONCHIAL ULTRASOUND;  Surgeon: Tyler Pita, MD;  Location: [facility];  Service: Cardiopulmonary;  Laterality: N/A;    FAMILY HISTORY: Family History  Problem Relation Age of Onset   Breast cancer Cousin 50  bilateral at 59 and 45; daughter of maternal aunt who was unaffected   Lung cancer Maternal Aunt        dx 31s; deceased 83s; smoker   Heart attack Father        deceased 34   Stomach cancer Maternal Grandmother     ADVANCED DIRECTIVES (Y/N):  N  HEALTH MAINTENANCE: Social History   Tobacco Use   Smoking status: Former    Packs/day: 1.00    Years: 30.00    Total pack years: 30.00    Types: Cigarettes    Quit date: 09/13/2015    Years since quitting: 6.6   Smokeless tobacco: Never  Vaping Use   Vaping Use: Never used  Substance Use Topics   Alcohol use: Yes    Comment: occasional   Drug use: No     Colonoscopy:  PAP:  Bone density:  Lipid panel:  Allergies  Allergen Reactions   Other     BANDAIDS-OF LEFT ON FOR AN EXTENDED PERIOD OF  TIME  Patient states latex rubber gloves are ok    Current Outpatient Medications  Medication Sig Dispense Refill   albuterol (VENTOLIN HFA) 108 (90 Base) MCG/ACT inhaler Inhale 1-2 puffs into the lungs every 6 (six) hours as needed for wheezing or shortness of breath. 1 g 1   alendronate (FOSAMAX) 70 MG tablet Take 1 tablet (70 mg total) by mouth once a week. Take with a full glass of water on an empty stomach. Stand or sit upright for 1 hour after taking (Patient taking differently: Take 70 mg by mouth once a week. Take with a full glass of water on an empty stomach. Stand or sit upright for 1 hour after taking WEDNESDAY) 12 tablet 1   ALPRAZolam (XANAX) 0.25 MG tablet Take 1 tablet (0.25 mg total) by mouth 2 (two) times daily as needed for anxiety. (Patient taking differently: Take 0.25 mg by mouth at bedtime.) 60 tablet 1   anastrozole (ARIMIDEX) 1 MG tablet Take 1 tablet (1 mg total) by mouth at bedtime. 30 tablet 0   aspirin-acetaminophen-caffeine (EXCEDRIN MIGRAINE) 834-196-22 MG tablet Take by mouth every 6 (six) hours as needed for headache.     Budeson-Glycopyrrol-Formoterol (BREZTRI AEROSPHERE) 160-9-4.8 MCG/ACT AERO Inhale 2 puffs into the lungs in the morning and at bedtime. 10.7 g 11   fluticasone (FLONASE) 50 MCG/ACT nasal spray Place 2 sprays into both nostrils daily as needed for allergies. 60 mL 1   folic acid (FOLVITE) 1 MG tablet Take 2 mg by mouth in the morning.     gabapentin (NEURONTIN) 300 MG capsule Take 1 capsule (300 mg total) by mouth 2 (two) times daily. 60 capsule 6   methotrexate (RHEUMATREX) 2.5 MG tablet Take 20 mg by mouth once a week. WEDNESDAY     montelukast (SINGULAIR) 10 MG tablet Take 1 tablet (10 mg total) by mouth at bedtime. 90 tablet 1   omeprazole (PRILOSEC) 40 MG capsule Take 1 capsule (40 mg total) by mouth daily. 30 capsule 3   potassium chloride (KLOR-CON) 10 MEQ tablet TAKE (1) TABLET BY MOUTH EVERY DAY 90 tablet 1   rosuvastatin (CRESTOR) 40 MG  tablet Take 40 mg by mouth every evening.     sertraline (ZOLOFT) 100 MG tablet Take 1 tablet (100 mg total) by mouth daily. 30 tablet 3   traMADol (ULTRAM) 50 MG tablet TAKE (1) TABLET BY MOUTH THREE TIMES A DAY AS NEEDED     ondansetron (ZOFRAN) 8 MG tablet Take  1 tablet (8 mg total) by mouth 2 (two) times daily as needed (Nausea or vomiting). Begin on the third day after cisplatin chemotherapy. (Patient not taking: Reported on 04/26/2022) 60 tablet 1   prochlorperazine (COMPAZINE) 10 MG tablet Take 1 tablet (10 mg total) by mouth every 6 (six) hours as needed (Nausea or vomiting). (Patient not taking: Reported on 04/26/2022) 60 tablet 1   No current facility-administered medications for this visit.    OBJECTIVE: Vitals:   04/26/22 1010  BP: 113/67  Pulse: 99  Resp: 18  Temp: 98.3 F (36.8 C)  SpO2: 98%     Body mass index is 26.95 kg/m.    ECOG FS:0 - Asymptomatic  General: Well-developed, well-nourished, no acute distress. Eyes: Pink conjunctiva, anicteric sclera. HEENT: Normocephalic, moist mucous membranes. Lungs: No audible wheezing or coughing. Heart: Regular rate and rhythm. Abdomen: Soft, nontender, no obvious distention. Musculoskeletal: No edema, cyanosis, or clubbing. Neuro: Alert, answering all questions appropriately. Cranial nerves grossly intact. Skin: No rashes or petechiae noted. Psych: Normal affect.  LAB RESULTS:  Lab Results  Component Value Date   NA 137 04/26/2022   K 4.0 04/26/2022   CL 102 04/26/2022   CO2 27 04/26/2022   GLUCOSE 123 (H) 04/26/2022   BUN 11 04/26/2022   CREATININE 0.77 04/26/2022   CALCIUM 8.7 (L) 04/26/2022   PROT 6.8 04/26/2022   ALBUMIN 3.5 04/26/2022   AST 17 04/26/2022   ALT 13 04/26/2022   ALKPHOS 104 04/26/2022   BILITOT 0.2 (L) 04/26/2022   GFRNONAA >60 04/26/2022   GFRAA >60 04/09/2020    Lab Results  Component Value Date   WBC 8.4 04/26/2022   NEUTROABS 6.6 04/26/2022   HGB 12.1 04/26/2022   HCT 39.3  04/26/2022   MCV 92.7 04/26/2022   PLT 179 04/26/2022     STUDIES: MR Brain W Wo Contrast  Result Date: 04/21/2022 CLINICAL DATA:  Squamous cell carcinoma of right lung. Metastatic disease evaluation. EXAM: MRI HEAD WITHOUT AND WITH CONTRAST TECHNIQUE: Multiplanar, multiecho pulse sequences of the brain and surrounding structures were obtained without and with intravenous contrast. CONTRAST:  41m GADAVIST GADOBUTROL 1 MMOL/ML IV SOLN COMPARISON:  No pertinent prior exams available for comparison. FINDINGS: Brain: Mild generalized parenchymal atrophy. Prominent perivascular spaces within the left deep gray nuclei. No cortical encephalomalacia is identified. No significant cerebral white matter disease for age. There is no acute infarct. No evidence of an intracranial mass. No chronic intracranial blood products. No extra-axial fluid collection. No midline shift. No pathologic intracranial enhancement identified. Vascular: Maintained flow voids within the proximal large arterial vessels. Skull and upper cervical spine: No focal suspicious marrow lesion. Sinuses/Orbits: No mass or acute finding within the imaged orbits. No significant paranasal sinus disease. IMPRESSION: No evidence of intracranial metastatic disease. Electronically Signed   By: KKellie SimmeringD.O.   On: 04/21/2022 09:42   DG Chest 2 View  Result Date: 04/05/2022 CLINICAL DATA:  Lung cancer EXAM: CHEST - 2 VIEW COMPARISON:  03/22/2022 FINDINGS: Postsurgical changes again noted on the right. Previously seen air-fluid level in the right upper hemithorax no longer visualized. Right basilar scarring or atelectasis. Left lung clear. Heart is normal size. No acute bony abnormality. IMPRESSION: Postoperative changes on the right. Right upper lobe lung air-fluid level no longer visualized. Electronically Signed   By: KRolm BaptiseM.D.   On: 04/05/2022 12:15    ASSESSMENT: History of DCIS in the right breast, now stage IIIa squamous cell carcinoma  of the  right upper lung.  PLAN:    1.  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  She was also noted to have 1 of 33 lymph nodes positive for disease.  This increased her to a stage IIIa.  Have recommended adjuvant chemotherapy using cisplatin plus Taxotere every 3 weeks for 4 cycles with Udenyca support.  MRI of the brain on April 21, 2022 reviewed independently and report as above with no obvious evidence of metastatic disease.  Patient tolerated cycle 1 relatively well, much of her side effects may be related to the [facility].  Return to clinic in 2 weeks as previously scheduled for further evaluation and consideration of cycle 2. 2. DCIS, right breast: Patient underwent lumpectomy followed by adjuvant XRT completing in September 2019.  Because there was no invasive component on her pathology, she did not require adjuvant chemotherapy. Patient could not tolerate tamoxifen or letrozole secondary to worsening joint pain, therefore was switched to anastrozole.  She is tolerating this well and will complete 5 years of treatment in September 2024.  Her most recent mammogram on March 11, 2022 was reported BI-RADS 1.  Repeat in June 2024. 3.  Osteopenia: Patient's most recent bone mineral density on March 25, 2021 revealed a T score of -2.3 which is slightly worse than previous where her T score was reported -1.9 and -1.4 and years prior.  Continue calcium, vitamin D, and alendronate.    4.  Surgical site tenderness, body aches: Patient was instructed is okay to increase both her tramadol and gabapentin to 3 times a day as needed.  Can also consider discontinuing Udenyca for subsequent cycles.     Lloyd Huger, MD 04/26/2022 12:38 PM   Cancer Staging  Ductal carcinoma in situ (DCIS) of right breast Staging form: Breast, AJCC 8th  Edition - Clinical: Stage 0 (cTis (DCIS), cN0, cM0, ER+, PR-, HER2-) - Signed by Lloyd Huger, MD on 03/18/2018 Nuclear grade: G3 Laterality: Right  Squamous cell carcinoma of right lung ([place]) Staging form: Lung, AJCC 8th Edition - Pathologic stage from 04/07/2022: Stage IIIA (pT3, pN1, cM0) - Signed by Lloyd Huger, MD on 04/07/2022

## 2022-04-23 NOTE — Telephone Encounter (Signed)
Patient called to report frontal headache, 6 out 10, tenderness of her sinus area. No running nose, fever or chills, nausea vomiting. She reports chronic sinus issue.  Recent MRI brain showed no acute process of intracranial or sinus/parasinus process.  She has tried Tylenol and symptoms are not relieved. She asks if she can take Exedrine, I think it is ok to try. Other OTC NSAIDs can be tried as well. If no improvement call back. Patient thanks me for the recommendation.

## 2022-04-25 ENCOUNTER — Telehealth: Payer: Self-pay | Admitting: *Deleted

## 2022-04-25 ENCOUNTER — Encounter: Payer: Self-pay | Admitting: Urology

## 2022-04-25 NOTE — Progress Notes (Signed)
Perioperative Services  Pre-Admission/Anesthesia Testing Clinical Review  Date: 04/25/22  Patient Demographics:  Name: Michelle Moore DOB:   August 27, 1956 MRN:   [id number]  Planned Surgical Procedure(s):    Case: [id number] Date/Time: 05/02/22 1152   Procedures:      TRANSURETHRAL RESECTION OF BLADDER TUMOR (TURBT)     BLADDER INSTILLATION OF GEMCITABINE     CYSTOSCOPY WITH RETROGRADE PYELOGRAM (Bilateral)   Anesthesia type: General   Pre-op diagnosis: Bladder Tumor   Location: [facility] OR ROOM 10 / [place] [facility]S FOR ANESTHESIA GROUP   Surgeons: Hollice Espy, MD   NOTE: Available PAT nursing documentation and vital signs have been reviewed. Clinical nursing staff has updated patient's PMH/PSHx, current medication list, and drug allergies/intolerances to ensure comprehensive history available to assist in medical decision making as it pertains to the aforementioned surgical procedure and anticipated anesthetic course. Extensive review of available clinical information performed. [place] PMH and PSHx updated with any diagnoses/procedures that  may have been inadvertently omitted during her intake with the pre-admission testing department's nursing staff.  Clinical Discussion:  Michelle Moore is a 66 y.o. female who is submitted for pre-surgical anesthesia review and clearance prior to her undergoing the above procedure. Patient is a Former Smoker (30 pack years; quit 09/2015). Pertinent PMH includes: CAD, STEMI, valvular heart disease, aortic atherosclerosis, HTN, HLD, DOE, GERD (on daily PPI), COPD, OSAH (requires nocturnal PAP therapy), RIGHT breast cancer, RIGHT lung cancer (s/p RUL lobectomy), PRES syndrome, seizure, OA, RA, anxiety (on BZO PRN), depression.  Patient is followed by cardiology Nehemiah Massed, MD). She was last seen in the cardiology clinic on 03/10/2022; notes reviewed. At the time of her clinic visit, the patient denied any chest pain, shortness of breath, PND, orthopnea,  palpitations, significant peripheral edema, vertiginous symptoms, or presyncope/syncope. Patient with a past medical history significant for cardiovascular diagnoses.  Patient suffered an inferior wall STEMI on 10/16/2009.  Diagnostic left heart catheterization was performed revealing a normal left ventricular systolic function with an EF of 60%.  There was multivessel CAD; 50% OM1 and 99% distal RCA.  Patient subsequently underwent PCI placing a 3.0 x 18 mm Xience V DES x 1 to the dRCA yielding an excellent angiographic result and TIMI-3 flow.   Diagnostic left heart catheterization was performed on 07/31/2014 revealing a normal left ventricular systolic function with an EF of 65%.  There was multivessel CAD; 40% proximal RCA, 20% proximal LCx, 40% OM1, 30% ostial RCA, 50% proximal RCA, and 20% mid RCA.  There was 10% ISR within the previously placed distal RCA stent.  Further intervention deferred opting for medical management.   Last complete TTE was performed on 10/10/2016 revealing a low normal left ventricular systolic function with mild LVH; LVEF 50%.  There was mild mitral and tricuspid valve regurgitation.  There was no evidence of a significant transvalvular gradient to suggest stenosis.   Stress echocardiogram was performed on 12/23/2019 revealing a normal left ventricular systolic function with an EF of 55%.  There was trivial mitral, tricuspid, and pulmonary valve regurgitation.  There was no evidence of valvular stenosis.  Blood pressure reasonably controlled at 130/70 without the use of pharmacological intervention.  Patient is on rosuvastatin for her HLD diagnosis and further ASCVD prevention.  She is not diabetic.  Patient has an OSAH diagnosis and is reportedly compliant with prescribed nocturnal PAP therapy.  Patient remains on aromatase inhibitor (anastrozole) therapy for previously diagnosed breast cancer. Functional capacity, as defined by DASI, is documented as being >/=  4 METS.  No  changes were made to her medication regimen.  Patient follow-up with outpatient cardiology in 12 months or sooner if needed.  Michelle Moore is scheduled for an TRANSURETHRAL RESECTION OF BLADDER TUMOR (TURBT); BLADDER INSTILLATION OF GEMCITABINE; CYSTOSCOPY WITH RETROGRADE PYELOGRAM (Bilateral) on 05/02/2022 with Dr. Hollice Espy, MD.  Given patient's past medical history significant for cardiovascular diagnoses, presurgical clearance was sought from patient's primary cardiologist, additionally, patient recently diagnosed with stage IIIA squamous cell carcinoma (pT3, pN1, cM0).  She underwent pulmonary lobectomy on 03/17/2022, followed by initiation of adjuvant systemic antineoplastic chemotherapy (docetaxel + cisplatin) on 04/20/2022.  Clearances were also sought from patient's cardiothoracic surgeon and attending oncologist.  Specialty clearances were obtained as follows:  Per cardiology Nehemiah Massed, MD), "the patient is at the lowest risk possible for perioperative cardiovascular complications with the planned procedure.  The overall risk her procedure is low (<1%).  Currently has no evidence active and/or significant angina and/or congestive heart failure. Patient may proceed to surgery without restriction or need for further cardiovascular testing at an overall LOW risk".  Per hematology Grayland Ormond, MD), "this patient is optimized for surgery, from an oncology perspective, and may proceed with the planned procedural course with a LOW risk of significant perioperative complications".  Per CVTS [name], MD), "patient is stable following pulmonary lobectomy.  May proceed with planned surgical intervention at an overall ACCEPTABLE risk of significant perioperative complications".   Patient reports previous perioperative complications with anesthesia in the past. Patient has been found to have a (+) difficult airway. In review of the available records, it is noted that patient underwent a general  anesthetic course at [facility] ([facility]) in [id number]/5462 with no complications from the administered medications. Patient was noted to be difficult to intubate.      04/22/2022    9:05 AM 04/20/2022   12:30 PM 04/20/2022   12:15 PM  Vitals with BMI  Height 5\' 0"     Weight 141 lbs    BMI 70.35    Systolic  009 381  Diastolic  67 68  Pulse  80 78    Providers/Specialists:   NOTE: Primary physician provider listed below. Patient may have been seen by APP or partner within same practice.   PROVIDER ROLE / SPECIALTY LAST Lu Duffel, MD Urology (Surgeon) 02/18/2022  Lavera Guise, MD Primary Care Provider 12/14/2021  Serafina Royals, MD Cardiology 03/10/2022  Modesto Charon, MD Cardiothoracic Surgery 04/05/2022  Delight Hoh, MD Medical Oncology 04/19/2022  Devona Konig, MD Pulmonary Medicine 01/14/2022   Allergies:  Other  Current Home Medications:   No current facility-administered medications for this encounter.    albuterol (VENTOLIN HFA) 108 (90 Base) MCG/ACT inhaler   alendronate (FOSAMAX) 70 MG tablet   ALPRAZolam (XANAX) 0.25 MG tablet   anastrozole (ARIMIDEX) 1 MG tablet   aspirin-acetaminophen-caffeine (EXCEDRIN MIGRAINE) 250-250-65 MG tablet   Budeson-Glycopyrrol-Formoterol (BREZTRI AEROSPHERE) 160-9-4.8 MCG/ACT AERO   fluticasone (FLONASE) 50 MCG/ACT nasal spray   folic acid (FOLVITE) 1 MG tablet   gabapentin (NEURONTIN) 300 MG capsule   methotrexate (RHEUMATREX) 2.5 MG tablet   montelukast (SINGULAIR) 10 MG tablet   omeprazole (PRILOSEC) 40 MG capsule   ondansetron (ZOFRAN) 8 MG tablet   potassium chloride (KLOR-CON) 10 MEQ tablet   prochlorperazine (COMPAZINE) 10 MG tablet   rosuvastatin (CRESTOR) 40 MG tablet   sertraline (ZOLOFT) 100 MG tablet   traMADol (ULTRAM) 50 MG tablet   History:   Past Medical History:  Diagnosis Date   Adenoma of right adrenal gland 12/07/2021   a.) CT chest 12/07/2021; measured 2.3 cm; not FDG avid on  01/06/2022 PET CT.   Anxiety    a.) uses BZO (alprazolam) PRN   Aortic atherosclerosis (HCC)    Arthritis    Bladder mass 12/07/2021   a.) CT abd 12/07/2021 --> 9 x 8 mm nodular enhancing focus at the base of the bladder; suspicious for urothelial neoplasm.   Cancer of upper lobe of right lung ([place]) 03/2022   stage 3a squamous cell carcinoma   COPD (chronic obstructive pulmonary disease) (HCC)    Coronary artery disease 10/16/2009   a.) LHC/PCI 10/16/2009: EF 60%; 50% OM1, 99% dRCA (3.0 x 18 mm Xience V DES). b.) LHC 07/31/2014: EF 65%; 40% pLAD, 20% pLCx, 40% OM1, 30% oRCA, 50% pRCA, 20% mRCA, 10% ISR stent to dRCA; further intervention deferred opting for med. mgmt.   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 b.) s/p lumpectomy, adjuvant XT; current on extended AI (anastrozole) therapy   Essential hypertension    GERD (gastroesophageal reflux disease)    HLD (hyperlipidemia)    Left thyroid nodule 12/07/2021   a.) CT chest; measured 2.6 cm   Long term current use of immunosuppressive drug    a.) MTX for RA.   OSA on CPAP    Osteopenia    Personal history of radiation therapy    Pneumonia    PRES (posterior reversible encephalopathy syndrome) 07/14/2014   Pulmonary nodules 11/08/2021   a.) CSR 11/08/2021: 27mm nodular density RUL. b.) CT chest 12/07/2021: 1.9 x 1.4 x 2.5 spiculated lesion anterior RUL. c.) PET CT 01/06/2022: 2.2 x 1.9 spiculated anterior RUL mass (SUV max 16.4)   Rheumatoid arthritis ([place])    a.) on MTX   Seasonal allergies    Seizures ([place]) 2014   a.) x 1 episode; etiology never determined.   Sinus headache    ST elevation myocardial infarction (STEMI) of inferior wall ([place]) 10/16/2009   a.) LHC/PCI 10/16/2009: EF 60%; 50% OM1, 99% dRCA (3.0 x 18 mm Xience V DES).   Tubular adenoma    Valvular heart disease    Past Surgical History:  Procedure Laterality Date    ABDOMINAL HYSTERECTOMY     ANTERIOR CERVICAL DECOMP/DISCECTOMY FUSION N/A 01/20/2014   Procedure: ANTERIOR CERVICAL DECOMPRESSION/DISCECTOMY FUSION 2 LEVELS cervical five/six six [place]ry Kos;  Surgeon: Ophelia Charter, MD;  Location: [facility] [place] NEURO ORS;  Service: Neurosurgery;  Laterality: N/A;   APPENDECTOMY     BACK SURGERY     lumbar   BREAST BIOPSY Right 02/01/2018   x shape, DCIS    BREAST BIOPSY  01/16/2019   coil, BENIGN MAMMARY TISSUE WITH MODERATE STROMAL FIBROSIS AND COARSE DYSTROPHIC CALCIFICATIONS of the RIGHT breast   BREAST LUMPECTOMY Right 03/07/2018   Procedure: BREAST LUMPECTOMY/ RE EXCISION;  Surgeon: Herbert Pun, MD;  Location: [facility] ORS;  Service: General;  Laterality: Right;   CARDIAC CATHETERIZATION Left 07/31/2014   Procedure: CARDIAC CATHETERIZATION; Location: [facility]; Surgeon: Serafina Royals, MD   COLONOSCOPY WITH PROPOFOL N/A 02/11/2021   Procedure: COLONOSCOPY WITH PROPOFOL;  Surgeon: Lucilla Lame, MD;  Location: [facility];  Service: Endoscopy;  Laterality: N/A;  Requests Early   CORONARY ANGIOPLASTY WITH STENT PLACEMENT Left 10/16/2009   Procedure: CORONARY ANGIOPLASTY WITH STENT PLACEMENT; Location: [place]; Surgeon: Katrine Coho, MD   INTERCOSTAL NERVE  BLOCK Right 03/17/2022   Procedure: INTERCOSTAL NERVE BLOCK;  Surgeon: Melrose Nakayama, MD;  Location: [place];  Service: Thoracic;  Laterality: Right;   LASIK Bilateral    LUNG LOBECTOMY Right 03/17/2022   right upper lobe   LYMPH NODE DISSECTION Right 03/17/2022   Procedure: LYMPH NODE DISSECTION;  Surgeon: Melrose Nakayama, MD;  Location: [place];  Service: Thoracic;  Laterality: Right;   PARTIAL MASTECTOMY WITH NEEDLE LOCALIZATION Right 02/21/2018   Procedure: PARTIAL MASTECTOMY WITH NEEDLE LOCALIZATION;  Surgeon: Herbert Pun, MD;  Location: [facility] ORS;  Service: General;  Laterality: Right;   POLYPECTOMY N/A 02/11/2021   Procedure: POLYPECTOMY;  Surgeon: Lucilla Lame, MD;  Location:  [place];  Service: Endoscopy;  Laterality: N/A;   ROTATOR CUFF REPAIR Left    SHOULDER ARTHROSCOPY WITH ROTATOR CUFF REPAIR AND OPEN BICEPS TENODESIS Right 08/07/2019   Procedure: SHOULDER ARTHROSCOPY WITH DEBRIDEMENT, DECOMPRESSION, ROTATOR CUFF REPAIR AND BICEPS TENODESIS. - RNFA;  Surgeon: Corky Mull, MD;  Location: [facility] ORS;  Service: Orthopedics;  Laterality: Right;   TOE SURGERY Right    has pin and plate in it   VIDEO BRONCHOSCOPY WITH ENDOBRONCHIAL ULTRASOUND N/A 03/02/2022   Procedure: VIDEO BRONCHOSCOPY WITH ENDOBRONCHIAL ULTRASOUND;  Surgeon: [place] Pita, MD;  Location: [facility] ORS;  Service: Cardiopulmonary;  Laterality: N/A;   Family History  Problem Relation Age of Onset   Breast cancer Cousin 24       bilateral at 7 and 17; daughter of maternal aunt who was unaffected   Lung cancer Maternal Aunt        dx 53s; deceased 40s; smoker   Heart attack Father        deceased 9   Stomach cancer Maternal Grandmother    Social History   Tobacco Use   Smoking status: Former    Packs/day: 1.00    Years: 30.00    Total pack years: 30.00    Types: Cigarettes    Quit date: 09/13/2015    Years since quitting: 6.6   Smokeless tobacco: Never  Vaping Use   Vaping Use: Never used  Substance Use Topics   Alcohol use: Yes    Comment: occasional   Drug use: No    Pertinent Clinical Results:  LABS: Labs reviewed: Acceptable for surgery.  No visits with results within 3 Day(s) from this visit.  Latest known visit with results is:  Appointment on 04/19/2022  Component Date Value Ref Range Status   Magnesium 04/19/2022 1.9  1.7 - 2.4 mg/dL Final   Performed at [facility], [place], [place] [address]   Sodium 04/19/2022 141  135 - 145 mmol/L Final   Potassium 04/19/2022 3.9  3.5 - 5.1 mmol/L Final   Chloride 04/19/2022 105  98 - 111 mmol/L Final   CO2 04/19/2022 27  22 - 32 mmol/L Final   Glucose, Bld 04/19/2022 124 (H)  70 - 99 mg/dL  Final   Glucose reference range applies only to samples taken after fasting for at least 8 hours.   BUN 04/19/2022 8  8 - 23 mg/dL Final   Creatinine, Ser 04/19/2022 0.63  0.44 - 1.00 mg/dL Final   Calcium 04/19/2022 8.8 (L)  8.9 - 10.3 mg/dL Final   Total Protein 04/19/2022 7.4  6.5 - 8.1 g/dL Final   Albumin 04/19/2022 3.3 (L)  3.5 - 5.0 g/dL Final   AST 04/19/2022 21  15 - 41 U/L Final   ALT 04/19/2022 20  0 - 44 U/L Final   Alkaline Phosphatase 04/19/2022 87  38 - 126 U/L Final   Total Bilirubin 04/19/2022 0.3  0.3 - 1.2 mg/dL Final   GFR, Estimated 04/19/2022 >60  >60 mL/min Final   Comment: (NOTE) Calculated using the CKD-EPI Creatinine Equation (2021)    Anion gap 04/19/2022 9  5 - 15 Final   Performed at [facility], [address] [place], [place] [address]   WBC 04/19/2022 7.7  4.0 - 10.5 K/uL Final   RBC 04/19/2022 4.09  3.87 - 5.11 MIL/uL Final   Hemoglobin 04/19/2022 11.9 (L)  12.0 - 15.0 g/dL Final   HCT 04/19/2022 38.6  36.0 - 46.0 % Final   MCV 04/19/2022 94.4  80.0 - 100.0 fL Final   MCH 04/19/2022 29.1  26.0 - 34.0 pg Final   MCHC 04/19/2022 30.8  30.0 - 36.0 g/dL Final   RDW 04/19/2022 13.9  11.5 - 15.5 % Final   Platelets 04/19/2022 236  150 - 400 K/uL Final   nRBC 04/19/2022 0.0  0.0 - 0.2 % Final   Neutrophils Relative % 04/19/2022 55  % Final   Neutro Abs 04/19/2022 4.2  1.7 - 7.7 K/uL Final   Lymphocytes Relative 04/19/2022 24  % Final   Lymphs Abs 04/19/2022 1.8  0.7 - 4.0 K/uL Final   Monocytes Relative 04/19/2022 13  % Final   Monocytes Absolute 04/19/2022 1.0  0.1 - 1.0 K/uL Final   Eosinophils Relative 04/19/2022 7  % Final   Eosinophils Absolute 04/19/2022 0.6 (H)  0.0 - 0.5 K/uL Final   Basophils Relative 04/19/2022 1  % Final   Basophils Absolute 04/19/2022 0.1  0.0 - 0.1 K/uL Final   Immature Granulocytes 04/19/2022 0  % Final   Abs Immature Granulocytes 04/19/2022 0.02  0.00 - 0.07 K/uL Final   Performed at [facility], [address] [place], [place] [address]    ECG: Date: 02/28/2022 Time ECG obtained: 1100 AM Rate: 67 bpm Rhythm:  Normal sinus rhythm with sinus arrhythmia Axis (leads I and aVF): Normal Intervals: PR 112 ms. QRS 74 ms. QTc 433 ms. ST segment and T wave changes: No evidence of acute ST segment elevation or depression Comparison: Similar to previous tracing obtained on 08/01/2019    IMAGING / PROCEDURES: MRI BRAIN W WO CONTRAST performed on 04/21/2022 No evidence of intracranial metastatic disease  DIAGNOSTIC RADIOGRAPHS OF CHEST 2 VIEWS performed on 04/05/2022 Postoperative changes on the RIGHT RIGHT upper lobe lung air-fluid level no longer visualized RIGHT basilar scarring or atelectasis LEFT lung clear Heart is normal size No acute osseous abnormality  NM PET IMAGE INITIAL (PI) SKULL BASE TO THIGH performed on 01/06/2022 2.2 cm spiculated nodule in the anterior right upper lobe, favoring primary bronchogenic neoplasm over metastasis. Dominant 12 mm short axis low right paratracheal node, indeterminate. Attention on follow-up is suggested. Destructive changes involving the right lateral 7th rib, worrisome for pathologic fracture. Bandlike opacity in the anterior left upper lobe, favored to be infectious/inflammatory. Postoperative seroma in the right breast, non FDG avid. Benign right adrenal adenoma.   CT ABDOMEN PELVIS W CONTRAST performed on 12/07/2021 Nodular enhancing focus at the base of the urinary bladder, measuring 9 x 8 mm. This is suspicious for urothelial neoplasm and further evaluation with cystoscopy is suggested. Signs of renal cortical scarring on the LEFT new from previous imaging. Background hepatic steatosis. Aortic atherosclerosis.   CT CHEST NODULE FOLLOW UP WO CONTRAST performed on 12/07/2021  1.9 x 1.4 x 2.5 cm irregular spiculated lesion in the anterior right upper lobe, tethered to the anterior pleura. If the patient has had prior radiation to this  region, scarring would be a possibility but neoplasm could certainly have this appearance. PET-CT recommended to further evaluate. 9 mm short axis right paratracheal lymph node, nonspecific. 6.5 x 4.1 x 6.6 cm fluid collection in the lateral right breast. In the setting of prior lumpectomy, this could represent a chronic seroma or hematoma. 2.6 cm left thyroid nodule. 2.3 cm right adrenal adenoma.  No follow-up recommended. Aortic atherosclerosis    DIAGNOSTIC RADIOGRAPHS OF CHEST 2 VIEWS performed on 11/08/2021 Large lung volumes with chronic interstitial coarsening (COPD). There is no edema, consolidation, effusion, or pneumothorax.  Vague nodular density over the right upper chest, likely anterior on the lateral view, measuring 18 mm, not seen on prior.  Normal heart size and mediastinal contours   Impression and Plan:  Michelle Moore has been referred for pre-anesthesia review and clearance prior to her undergoing the planned anesthetic and procedural courses. Available labs, pertinent testing, and imaging results were personally reviewed by me. This patient has been appropriately cleared by cardiology (LOW), cardiothoracic surgery (ACCEPTABLE), and by medical oncology (LOW) with the individually indicated risk of significant perioperative complications.  Based on clinical review performed today (04/25/22), barring any significant acute changes in the patient's overall condition, it is anticipated that she will be able to proceed with the planned surgical intervention. Any acute changes in clinical condition may necessitate her procedure being postponed and/or cancelled. Patient will meet with anesthesia team (MD and/or CRNA) on the day of her procedure for preoperative evaluation/assessment. Questions regarding anesthetic course will be fielded at that time.   Pre-surgical instructions were reviewed with the patient during her PAT appointment and questions were fielded by PAT clinical staff.  Patient was advised that if any questions or concerns arise prior to her procedure then she should return a call to PAT and/or her surgeon's office to discuss.  [name], MSN, APRN, FNP-C, CEN [facility]  Peri-operative Services Nurse Practitioner Phone: [phone] Fax: [phone] 04/25/22 4:52 PM  NOTE: This note has been prepared using Dragon dictation software. Despite my best ability to proofread, there is always the potential that unintentional transcriptional errors may still occur from this process.

## 2022-04-25 NOTE — Telephone Encounter (Signed)
-----   Message from Melrose Nakayama, MD sent at 04/25/2022  8:43 AM EDT ----- yes ----- Message ----- From: Ladon Applebaum, RN Sent: 04/22/2022   4:37 PM EDT To: Melrose Nakayama, MD  Dr. [name],  Michelle Moore (s/p RATs lobectomy 7/6) is scheduled to have TRANSURETHRAL RESECTION OF BLADDER TUMOR (TURBT) BLADDER INSTILLATION OF GEMCITABINE  CYSTOSCOPY WITH RETROGRADE PYELOGRAM (Bilateral) on 8/21. Her Urologist's office reached wanting to know if from a surgical standpoint, they may proceed. Please advise.  Thanks,  [employer]

## 2022-04-26 ENCOUNTER — Encounter: Payer: Self-pay | Admitting: Oncology

## 2022-04-26 ENCOUNTER — Inpatient Hospital Stay: Payer: Medicare HMO

## 2022-04-26 ENCOUNTER — Inpatient Hospital Stay: Payer: Medicare HMO | Admitting: Oncology

## 2022-04-26 ENCOUNTER — Encounter: Payer: Self-pay | Admitting: *Deleted

## 2022-04-26 VITALS — BP 113/67 | HR 99 | Temp 98.3°F | Resp 18 | Ht 60.0 in | Wt 138.0 lb

## 2022-04-26 DIAGNOSIS — C3411 Malignant neoplasm of upper lobe, right bronchus or lung: Secondary | ICD-10-CM | POA: Diagnosis not present

## 2022-04-26 DIAGNOSIS — Z5111 Encounter for antineoplastic chemotherapy: Secondary | ICD-10-CM | POA: Diagnosis not present

## 2022-04-26 DIAGNOSIS — C3491 Malignant neoplasm of unspecified part of right bronchus or lung: Secondary | ICD-10-CM

## 2022-04-26 DIAGNOSIS — Z79811 Long term (current) use of aromatase inhibitors: Secondary | ICD-10-CM | POA: Diagnosis not present

## 2022-04-26 DIAGNOSIS — G8928 Other chronic postprocedural pain: Secondary | ICD-10-CM | POA: Diagnosis not present

## 2022-04-26 DIAGNOSIS — Z902 Acquired absence of lung [part of]: Secondary | ICD-10-CM | POA: Diagnosis not present

## 2022-04-26 DIAGNOSIS — R5383 Other fatigue: Secondary | ICD-10-CM | POA: Diagnosis not present

## 2022-04-26 DIAGNOSIS — R52 Pain, unspecified: Secondary | ICD-10-CM | POA: Diagnosis not present

## 2022-04-26 DIAGNOSIS — Z5189 Encounter for other specified aftercare: Secondary | ICD-10-CM | POA: Diagnosis not present

## 2022-04-26 DIAGNOSIS — D0511 Intraductal carcinoma in situ of right breast: Secondary | ICD-10-CM | POA: Diagnosis not present

## 2022-04-26 LAB — CBC WITH DIFFERENTIAL/PLATELET
Abs Immature Granulocytes: 0.04 10*3/uL (ref 0.00–0.07)
Basophils Absolute: 0.1 10*3/uL (ref 0.0–0.1)
Basophils Relative: 1 %
Eosinophils Absolute: 0.2 10*3/uL (ref 0.0–0.5)
Eosinophils Relative: 2 %
HCT: 39.3 % (ref 36.0–46.0)
Hemoglobin: 12.1 g/dL (ref 12.0–15.0)
Immature Granulocytes: 1 %
Lymphocytes Relative: 10 %
Lymphs Abs: 0.9 10*3/uL (ref 0.7–4.0)
MCH: 28.5 pg (ref 26.0–34.0)
MCHC: 30.8 g/dL (ref 30.0–36.0)
MCV: 92.7 fL (ref 80.0–100.0)
Monocytes Absolute: 0.7 10*3/uL (ref 0.1–1.0)
Monocytes Relative: 8 %
Neutro Abs: 6.6 10*3/uL (ref 1.7–7.7)
Neutrophils Relative %: 78 %
Platelets: 179 10*3/uL (ref 150–400)
RBC: 4.24 MIL/uL (ref 3.87–5.11)
RDW: 14 % (ref 11.5–15.5)
Smear Review: NORMAL
WBC: 8.4 10*3/uL (ref 4.0–10.5)
nRBC: 0 % (ref 0.0–0.2)

## 2022-04-26 LAB — COMPREHENSIVE METABOLIC PANEL
ALT: 13 U/L (ref 0–44)
AST: 17 U/L (ref 15–41)
Albumin: 3.5 g/dL (ref 3.5–5.0)
Alkaline Phosphatase: 104 U/L (ref 38–126)
Anion gap: 8 (ref 5–15)
BUN: 11 mg/dL (ref 8–23)
CO2: 27 mmol/L (ref 22–32)
Calcium: 8.7 mg/dL — ABNORMAL LOW (ref 8.9–10.3)
Chloride: 102 mmol/L (ref 98–111)
Creatinine, Ser: 0.77 mg/dL (ref 0.44–1.00)
GFR, Estimated: >60 mL/min (ref >60)
Glucose, Bld: 123 mg/dL — ABNORMAL HIGH (ref 70–99)
Potassium: 4 mmol/L (ref 3.5–5.1)
Sodium: 137 mmol/L (ref 135–145)
Total Bilirubin: 0.2 mg/dL — ABNORMAL LOW (ref 0.3–1.2)
Total Protein: 6.8 g/dL (ref 6.5–8.1)

## 2022-04-26 LAB — MAGNESIUM: Magnesium: 1.7 mg/dL (ref 1.7–2.4)

## 2022-04-26 NOTE — Progress Notes (Signed)
Patient reports feeling tired, no energy, achy, back pain, dry lips, no taste, constipation has resolved but now has hemorrhoids, and head spinning. She states that she has started feeling worse over the last couple days. Patient is questioning continuing treatment.

## 2022-04-29 ENCOUNTER — Other Ambulatory Visit: Payer: Self-pay | Admitting: Oncology

## 2022-04-29 DIAGNOSIS — C3491 Malignant neoplasm of unspecified part of right bronchus or lung: Secondary | ICD-10-CM

## 2022-04-29 MED ORDER — GEMCITABINE CHEMO FOR BLADDER INSTILLATION 2000 MG
2000.0000 mg | Freq: Once | INTRAVENOUS | Status: DC
  Filled 2022-04-29: qty 52.6

## 2022-04-29 NOTE — Progress Notes (Signed)
ON PATHWAY REGIMEN - Non-Small Cell Lung  No Change  Continue With Treatment as Ordered.  Original Decision Date/Time: 04/07/2022 11:56     A cycle is every 21 days:     Docetaxel      Cisplatin   **Always confirm dose/schedule in your pharmacy ordering system**  Patient Characteristics: Postoperative without Neoadjuvant Therapy (Pathologic Staging), Stage III, Adjuvant Chemotherapy, Squamous Cell Therapeutic Status: Postoperative without Neoadjuvant Therapy (Pathologic Staging) AJCC T Category: pT3 AJCC N Category: pN1 AJCC M Category: cM0 AJCC 8 Stage Grouping: IIIA Histology: Squamous Cell Intent of Therapy: Curative Intent, Discussed with Patient

## 2022-05-01 MED ORDER — CEFAZOLIN SODIUM-DEXTROSE 2-4 GM/100ML-% IV SOLN
2.0000 g | INTRAVENOUS | Status: AC
  Administered 2022-05-02: 2 g via INTRAVENOUS

## 2022-05-01 MED ORDER — CHLORHEXIDINE GLUCONATE 0.12 % MT SOLN: 15.0000 mL | Freq: Once | OROMUCOSAL | Status: AC

## 2022-05-01 MED ORDER — LACTATED RINGERS IV SOLN: INTRAVENOUS | Status: DC

## 2022-05-01 MED ORDER — PROPOFOL 1000 MG/100ML IV EMUL
INTRAVENOUS | Status: AC
  Filled 2022-05-01: qty 100

## 2022-05-01 MED ORDER — ORAL CARE MOUTH RINSE: 15.0000 mL | Freq: Once | OROMUCOSAL | Status: AC

## 2022-05-02 ENCOUNTER — Ambulatory Visit: Payer: Medicare HMO | Admitting: Urgent Care

## 2022-05-02 ENCOUNTER — Other Ambulatory Visit: Payer: Self-pay | Admitting: Thoracic Surgery (Cardiothoracic Vascular Surgery)

## 2022-05-02 ENCOUNTER — Ambulatory Visit
Admission: RE | Admit: 2022-05-02 | Discharge: 2022-05-02 | Disposition: A | Discharge status: Home / Self Care | Payer: Medicare HMO | Attending: Urology | Admitting: Urology

## 2022-05-02 ENCOUNTER — Ambulatory Visit: Payer: Medicare HMO

## 2022-05-02 ENCOUNTER — Encounter
Admission: RE | Disposition: A | Discharge status: Home / Self Care | Payer: Self-pay | Source: Home / Self Care | Attending: Urology

## 2022-05-02 ENCOUNTER — Encounter: Payer: Self-pay | Admitting: Urology

## 2022-05-02 DIAGNOSIS — G4733 Obstructive sleep apnea (adult) (pediatric): Secondary | ICD-10-CM | POA: Insufficient documentation

## 2022-05-02 DIAGNOSIS — K219 Gastro-esophageal reflux disease without esophagitis: Secondary | ICD-10-CM | POA: Diagnosis not present

## 2022-05-02 DIAGNOSIS — I2510 Atherosclerotic heart disease of native coronary artery without angina pectoris: Secondary | ICD-10-CM | POA: Insufficient documentation

## 2022-05-02 DIAGNOSIS — D494 Neoplasm of unspecified behavior of bladder: Secondary | ICD-10-CM | POA: Diagnosis not present

## 2022-05-02 DIAGNOSIS — E785 Hyperlipidemia, unspecified: Secondary | ICD-10-CM | POA: Diagnosis not present

## 2022-05-02 DIAGNOSIS — I10 Essential (primary) hypertension: Secondary | ICD-10-CM | POA: Insufficient documentation

## 2022-05-02 DIAGNOSIS — Z902 Acquired absence of lung [part of]: Secondary | ICD-10-CM | POA: Insufficient documentation

## 2022-05-02 DIAGNOSIS — I252 Old myocardial infarction: Secondary | ICD-10-CM | POA: Insufficient documentation

## 2022-05-02 DIAGNOSIS — J449 Chronic obstructive pulmonary disease, unspecified: Secondary | ICD-10-CM | POA: Diagnosis not present

## 2022-05-02 DIAGNOSIS — Z87891 Personal history of nicotine dependence: Secondary | ICD-10-CM | POA: Diagnosis not present

## 2022-05-02 DIAGNOSIS — R9341 Abnormal radiologic findings on diagnostic imaging of renal pelvis, ureter, or bladder: Secondary | ICD-10-CM | POA: Diagnosis not present

## 2022-05-02 DIAGNOSIS — C3491 Malignant neoplasm of unspecified part of right bronchus or lung: Secondary | ICD-10-CM

## 2022-05-02 HISTORY — PX: BLADDER INSTILLATION: SHX6893

## 2022-05-02 HISTORY — PX: CYSTOSCOPY W/ RETROGRADES: SHX1426

## 2022-05-02 HISTORY — PX: TRANSURETHRAL RESECTION OF BLADDER TUMOR: SHX2575

## 2022-05-02 SURGERY — TURBT (TRANSURETHRAL RESECTION OF BLADDER TUMOR)
Anesthesia: General

## 2022-05-02 MED ORDER — LACTATED RINGERS IV SOLN: INTRAVENOUS | Status: DC | PRN

## 2022-05-02 MED ORDER — FENTANYL CITRATE (PF) 100 MCG/2ML IJ SOLN: 25.0000 ug | INTRAMUSCULAR | Status: DC | PRN

## 2022-05-02 MED ORDER — MIDAZOLAM HCL 2 MG/2ML IJ SOLN
INTRAMUSCULAR | Status: DC | PRN
  Administered 2022-05-02: 1 mg via INTRAVENOUS

## 2022-05-02 MED ORDER — MIDAZOLAM HCL 2 MG/2ML IJ SOLN
INTRAMUSCULAR | Status: AC
  Filled 2022-05-02: qty 2

## 2022-05-02 MED ORDER — CEFAZOLIN SODIUM-DEXTROSE 2-4 GM/100ML-% IV SOLN
INTRAVENOUS | Status: AC
  Filled 2022-05-02: qty 100

## 2022-05-02 MED ORDER — CHLORHEXIDINE GLUCONATE 0.12 % MT SOLN
OROMUCOSAL | Status: AC
  Administered 2022-05-02: 15 mL via OROMUCOSAL
  Filled 2022-05-02: qty 15

## 2022-05-02 MED ORDER — PROPOFOL 10 MG/ML IV BOLUS
INTRAVENOUS | Status: DC | PRN
  Administered 2022-05-02: 200 mg via INTRAVENOUS

## 2022-05-02 MED ORDER — IOHEXOL 180 MG/ML  SOLN
INTRAMUSCULAR | Status: DC | PRN
  Administered 2022-05-02: 10 mL

## 2022-05-02 MED ORDER — OXYBUTYNIN CHLORIDE 5 MG PO TABS: 5.0000 mg | ORAL_TABLET | Freq: Three times a day (TID) | ORAL | 0 refills | Status: DC | PRN

## 2022-05-02 MED ORDER — FENTANYL CITRATE (PF) 100 MCG/2ML IJ SOLN
INTRAMUSCULAR | Status: AC
  Filled 2022-05-02: qty 2

## 2022-05-02 MED ORDER — HYDROCODONE-ACETAMINOPHEN 5-325 MG PO TABS: 1.0000 | ORAL_TABLET | Freq: Four times a day (QID) | ORAL | 0 refills | Status: DC | PRN

## 2022-05-02 MED ORDER — LIDOCAINE HCL (CARDIAC) PF 100 MG/5ML IV SOSY
PREFILLED_SYRINGE | INTRAVENOUS | Status: DC | PRN
  Administered 2022-05-02: 100 mg via INTRAVENOUS

## 2022-05-02 MED ORDER — GEMCITABINE CHEMO FOR BLADDER INSTILLATION 2000 MG
INTRAVENOUS | Status: DC | PRN
  Administered 2022-05-02: 2000 mg via INTRAVESICAL

## 2022-05-02 MED ORDER — OXYCODONE HCL 5 MG/5ML PO SOLN: 5.0000 mg | Freq: Once | ORAL | Status: DC | PRN

## 2022-05-02 MED ORDER — OXYCODONE HCL 5 MG PO TABS: 5.0000 mg | ORAL_TABLET | Freq: Once | ORAL | Status: DC | PRN

## 2022-05-02 MED ORDER — SODIUM CHLORIDE 0.9 % IR SOLN
Status: DC | PRN
  Administered 2022-05-02: 1500 mL

## 2022-05-02 SURGICAL SUPPLY — 36 items
BAG DRAIN SIEMENS DORNER NS (MISCELLANEOUS) ×2 IMPLANT
BAG DRN NS LF (MISCELLANEOUS) ×2
BAG DRN RND TRDRP ANRFLXCHMBR (UROLOGICAL SUPPLIES) ×2
BAG URINE DRAIN 2000ML AR STRL (UROLOGICAL SUPPLIES) ×2 IMPLANT
BRUSH SCRUB EZ  4% CHG (MISCELLANEOUS) ×2
BRUSH SCRUB EZ 1% IODOPHOR (MISCELLANEOUS) ×2 IMPLANT
BRUSH SCRUB EZ 4% CHG (MISCELLANEOUS) ×2 IMPLANT
CATH FOLEY 2WAY  5CC 16FR (CATHETERS) ×2
CATH FOLEY 2WAY 5CC 16FR (CATHETERS) ×2
CATH FOLEY 2WAY 5CC 18FR (CATHETERS) IMPLANT
CATH URETL OPEN 5X70 (CATHETERS) ×2 IMPLANT
CATH URTH 16FR FL 2W BLN LF (CATHETERS) ×2 IMPLANT
DRAPE UTILITY 15X26 TOWEL STRL (DRAPES) ×2 IMPLANT
DRSG TELFA 3X4 N-ADH STERILE (GAUZE/BANDAGES/DRESSINGS) ×2 IMPLANT
ELECT LOOP 22F BIPOLAR SML (ELECTROSURGICAL) ×2
ELECTRODE LOOP 22F BIPOLAR SML (ELECTROSURGICAL) IMPLANT
GAUZE 4X4 16PLY ~~LOC~~+RFID DBL (SPONGE) ×4 IMPLANT
GLOVE BIO SURGEON STRL SZ 6.5 (GLOVE) ×2 IMPLANT
GOWN STRL REUS W/ TWL LRG LVL3 (GOWN DISPOSABLE) ×4 IMPLANT
GOWN STRL REUS W/TWL LRG LVL3 (GOWN DISPOSABLE) ×4
GUIDEWIRE STR DUAL SENSOR (WIRE) ×2 IMPLANT
IV NS IRRIG 3000ML ARTHROMATIC (IV SOLUTION) ×2 IMPLANT
KIT TURNOVER CYSTO (KITS) ×2 IMPLANT
NDL SAFETY ECLIPSE 18X1.5 (NEEDLE) ×2 IMPLANT
NEEDLE HYPO 18GX1.5 SHARP (NEEDLE) ×2
PACK CYSTO AR (MISCELLANEOUS) ×2 IMPLANT
PAD ARMBOARD 7.5X6 YLW CONV (MISCELLANEOUS) ×2 IMPLANT
SET CYSTO W/LG BORE CLAMP LF (SET/KITS/TRAYS/PACK) ×2 IMPLANT
SET IRRIG Y TYPE TUR BLADDER L (SET/KITS/TRAYS/PACK) ×2 IMPLANT
SURGILUBE 2OZ TUBE FLIPTOP (MISCELLANEOUS) ×2 IMPLANT
SYR TOOMEY IRRIG 70ML (MISCELLANEOUS) ×2
SYRINGE TOOMEY IRRIG 70ML (MISCELLANEOUS) ×2 IMPLANT
TRAP FLUID SMOKE EVACUATOR (MISCELLANEOUS) ×2 IMPLANT
WATER STERILE IRR 1000ML POUR (IV SOLUTION) ×2 IMPLANT
WATER STERILE IRR 3000ML UROMA (IV SOLUTION) IMPLANT
WATER STERILE IRR 500ML POUR (IV SOLUTION) ×2 IMPLANT

## 2022-05-02 NOTE — H&P (Signed)
05/02/22 RRR CTAB  S/p lung resection now on chemo with Dr. Grayland Ormond.  Discussed with him, ideal time just prior to next infusion to proceed with minor TURBT procedure.  All questions answered   Michelle Moore [birth year] [id number]   Referring provider:  Lavera Guise, [place],   [id number] No chief complaint on file.         HPI: Michelle Moore is a 66 y.o.female who presents today for further evaluation of urethral mass.    She was seen by pulmonology on 11/16/2021. She was noted to have generalized abdominal pain she has a personal history of abnormal adenoma. She underwent a CT abdomen and pelvis to further evaluate and was visualized to have a well circumscribed right adrenal adenoma measuring 2.3 x 1.4 cm unchanged. Moderate left upper pole scarring and Nodular enhancing focus at the base of the urinary bladder 9 x 8 mm with no perivesical stranding.   She is also now under the care of of medical oncology, Dr. Grayland Ormond for further evaluation/treatment of her lung nodule.  This was presented at tumor board yesterday.  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   She reports occasion pain in her left upper abdomen underneath her rib cage.  She wonders if this is related to her bladder.   She was a smoker for 40 years x1 pack daily.  She quit smoking 6 years ago.   PMH:     Past Medical History:  Diagnosis Date   Anxiety     Arthritis     Breast cancer ([place])     Cancer ([place])      breast-right    COPD (chronic obstructive pulmonary disease) ([place])     Coronary artery disease     Dyspnea      ONLY WITH EXERTION   GERD (gastroesophageal reflux disease)      "sometimes"   Headache(784.0)      sinus headaches   Myocardial infarction ([place])      in 2011  no damage-1 stent   Personal history of radiation therapy     Seizures ([place]) 2014    X1-no idea what caused her seizure   Sleep apnea       USES CPAP      Surgical History:      Past Surgical History:  Procedure Laterality Date   ABDOMINAL HYSTERECTOMY       ANTERIOR CERVICAL DECOMP/DISCECTOMY FUSION N/A 01/20/2014    Procedure: ANTERIOR CERVICAL DECOMPRESSION/DISCECTOMY FUSION 2 LEVELS cervical five/six six Tarry Kos;  Surgeon: Ophelia Charter, MD;  Location: [facility];  Service: Neurosurgery;  Laterality: N/A;   BACK SURGERY        lumbar   BREAST BIOPSY Right 02/01/2018    x shape, DCIS    BREAST BIOPSY   01/16/2019    coil, BENIGN MAMMARY TISSUE WITH MODERATE STROMAL FIBROSIS AND COARSE DYSTROPHIC CALCIFICATIONS of the RIGHT breast   BREAST LUMPECTOMY Right 03/07/2018    Procedure: BREAST LUMPECTOMY/ RE EXCISION;  Surgeon: Herbert Pun, MD;  Location: [facility];  Service: General;  Laterality: Right;   CARDIAC CATHETERIZATION        2011   COLONOSCOPY WITH PROPOFOL N/A 02/11/2021    Procedure: COLONOSCOPY WITH PROPOFOL;  Surgeon: Lucilla Lame, MD;  Location: [facility];  Service: Endoscopy;  Laterality: N/A;  Requests Early   CORONARY ANGIOPLASTY WITH STENT PLACEMENT  PLACED IN RCA 2011   EYE SURGERY        lasik   PARTIAL MASTECTOMY WITH NEEDLE LOCALIZATION Right 02/21/2018    Procedure: PARTIAL MASTECTOMY WITH NEEDLE LOCALIZATION;  Surgeon: Herbert Pun, MD;  Location: [facility];  Service: General;  Laterality: Right;   POLYPECTOMY N/A 02/11/2021    Procedure: POLYPECTOMY;  Surgeon: Lucilla Lame, MD;  Location: [facility];  Service: Endoscopy;  Laterality: N/A;   ROTATOR CUFF REPAIR        left shoulder   SHOULDER ARTHROSCOPY WITH ROTATOR CUFF REPAIR AND OPEN BICEPS TENODESIS Right 08/07/2019    Procedure: SHOULDER ARTHROSCOPY WITH DEBRIDEMENT, DECOMPRESSION, ROTATOR CUFF REPAIR AND BICEPS TENODESIS. - RNFA;  Surgeon: Corky Mull, MD;  Location: [facility];  Service: Orthopedics;  Laterality: Right;   TOE SURGERY Right      has pin and plate in it      Home Medications:   Allergies as of 02/18/2022         Reactions    Other      BANDAIDS-OF LEFT ON FOR AN EXTENDED PERIOD OF TIME            Medication List           Accurate as of February 18, 2022  6:02 AM. If you have any questions, ask your nurse or doctor.              albuterol 108 (90 Base) MCG/ACT inhaler Commonly known as: VENTOLIN HFA Inhale 1-2 puffs into the lungs every 6 (six) hours as needed for wheezing or shortness of breath.    alendronate 70 MG tablet Commonly known as: Fosamax Take 1 tablet (70 mg total) by mouth once a week. Take with a full glass of water on an empty stomach. Stand or sit upright for 1 hour after taking    ALPRAZolam 0.25 MG tablet Commonly known as: XANAX Take 1 tablet (0.25 mg total) by mouth 2 (two) times daily as needed for anxiety.    anastrozole 1 MG tablet Commonly known as: ARIMIDEX TAKE (1) TABLET BY MOUTH EVERY DAY    aspirin-acetaminophen-caffeine 671-245-80 MG tablet Commonly known as: EXCEDRIN MIGRAINE Take by mouth every 6 (six) hours as needed for headache.    buPROPion 150 MG 24 hr tablet Commonly known as: WELLBUTRIN XL TAKE (1) TABLET BY MOUTH EVERY DAY    cetirizine 10 MG tablet Commonly known as: ZYRTEC Take 10 mg by mouth daily as needed for allergies.    fluticasone 50 MCG/ACT nasal spray Commonly known as: FLONASE Place 2 sprays into both nostrils daily as needed for allergies.    fluticasone-salmeterol 250-50 MCG/ACT Aepb Commonly known as: ADVAIR Inhale 1 puff into the lungs in the morning and at bedtime.    folic acid 998 MCG tablet Commonly known as: FOLVITE Take 1,600 mcg by mouth daily.    methotrexate 2.5 MG tablet Commonly known as: RHEUMATREX Take 20 mg by mouth every Wednesday.    montelukast 10 MG tablet Commonly known as: SINGULAIR Take 1 tablet (10 mg total) by mouth at bedtime.    omeprazole 40 MG capsule Commonly known as: PRILOSEC Take 1 capsule (40 mg total) by mouth daily.    potassium  chloride 10 MEQ tablet Commonly known as: KLOR-CON M Take 1 tablet (10 mEq total) by mouth daily.    sertraline 100 MG tablet Commonly known as: ZOLOFT Take 1 tablet (100 mg total) by mouth daily.    simvastatin 40 MG  tablet Commonly known as: ZOCOR Take 1 tablet (40 mg total) by mouth at bedtime.    traMADol 50 MG tablet Commonly known as: ULTRAM    triamcinolone cream 0.1 % Commonly known as: KENALOG Apply 1 application. topically 2 (two) times daily. To affected area.             Allergies:       Allergies  Allergen Reactions   Other        BANDAIDS-OF LEFT ON FOR AN EXTENDED PERIOD OF TIME      Family History:      Family History  Problem Relation Age of Onset   Breast cancer Cousin 71        bilateral at 3 and 29; daughter of maternal aunt who was unaffected   Lung cancer Maternal Aunt          dx 77s; deceased 12s; smoker   Heart attack Father          deceased 98   Stomach cancer Maternal Grandmother        Social History:  reports that she quit smoking about 6 years ago. Her smoking use included cigarettes. She has a 30.00 pack-year smoking history. She has never used smokeless tobacco. She reports current alcohol use. She reports that she does not use drugs.     Physical Exam: There were no vitals taken for this visit.  Constitutional:  Alert and oriented, No acute distress. HEENT: [place] AT, moist mucus membranes.  Trachea midline, no masses. Cardiovascular: No clubbing, cyanosis, or edema. Respiratory: Normal respiratory effort, no increased work of breathing. Skin: No rashes, bruises or suspicious lesions. Neurologic: Grossly intact, no focal deficits, moving all 4 extremities. Psychiatric: Normal mood and affect.   Laboratory Data: Recent Labs       Lab Results  Component Value Date    CREATININE 0.77 06/09/2021          Pertinent Imaging: CLINICAL DATA:  Abdominal pain, follow-up for adrenal adenoma.   EXAM: CT ABDOMEN AND PELVIS WITH  CONTRAST   TECHNIQUE: Multidetector CT imaging of the abdomen and pelvis was performed using the standard protocol following bolus administration of intravenous contrast.   RADIATION DOSE REDUCTION: This exam was performed according to the departmental dose-optimization program which includes automated exposure control, adjustment of the mA and/or kV according to patient size and/or use of iterative reconstruction technique.   CONTRAST:  124mL OMNIPAQUE IOHEXOL 300 MG/ML  SOLN   COMPARISON:  Comparison made with July 17, 2013.   FINDINGS: Lower chest: No effusion.  No consolidation.   Hepatobiliary: Liver with smooth contours. Background hepatic steatosis. No pericholecystic stranding. Portal vein is patent. No biliary duct distension.   Pancreas: Normal, without mass, inflammation or ductal dilatation.   Spleen: Normal.   Adrenals/Urinary Tract: Well-circumscribed RIGHT adrenal adenoma measuring 2.3 x 1.4 cm unchanged. No follow-up recommended for this finding.   LEFT adrenal gland is normal.   Moderate LEFT upper pole scarring is new. Enhancement of the kidneys is otherwise symmetric without signs of hydronephrosis, perinephric stranding or suspicious renal lesion. Nodular enhancing focus at the base of the urinary bladder 9 x 8 mm (image 71/2) no perivesical stranding.   Stomach/Bowel: No acute gastrointestinal process. Appendix not visualized, no secondary signs to suggest acute appendicitis. Ingested contrast media passes through the colon.   Vascular/Lymphatic:   Aortic atherosclerosis. No sign of aneurysm. Smooth contour of the IVC. There is no gastrohepatic or hepatoduodenal ligament lymphadenopathy. No retroperitoneal or  mesenteric lymphadenopathy.   No pelvic sidewall lymphadenopathy.   Atherosclerotic changes are moderate both calcified and noncalcified plaque is noted in the abdominal aorta and in the iliac vessels. No signs   Reproductive: Post  hysterectomy.   Other: No ascites.   Musculoskeletal: Signs of lumbar spinal fusion extending from L3 through L5. No acute bone finding. No destructive bone process. Spinal degenerative changes.   IMPRESSION: 1. Nodular enhancing focus at the base of the urinary bladder, measuring 9 x 8 mm. This is suspicious for urothelial neoplasm and further evaluation with cystoscopy is suggested. 2. Signs of renal cortical scarring on the LEFT new from previous imaging. 3. Background hepatic steatosis. 4. Aortic atherosclerosis.   Aortic Atherosclerosis (ICD10-I70.0).   These results will be called to the ordering clinician or representative by the Radiologist Assistant, and communication documented in the PACS or [employer].     Electronically Signed   By: Zetta Bills M.D.   On: 12/08/2021 09:41   I have personally reviewed the images and agree with radiologist interpretation.      Assessment & Plan:     Bladder mass - She is in the high risk for bladder cancer in light of her extensive smoking history  - She was offered an in office cystoscopy today; she is agreeable with this plan. See cystoscopy note for findings. 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  Risk and benefits were reviewed.     I,Kailey Littlejohn,acting as a Education administrator for Hollice Espy, MD.,have documented all relevant documentation on the behalf of Hollice Espy, MD,as directed by  Hollice Espy, MD while in the presence of Hollice Espy, MD.   I have reviewed the above documentation for accuracy and completeness, and I agree with the above.    Hollice Espy, MD

## 2022-05-02 NOTE — Discharge Instructions (Addendum)

## 2022-05-02 NOTE — Anesthesia Postprocedure Evaluation (Signed)
Anesthesia Post Note  Patient: Michelle Moore  Procedure(s) Performed: TRANSURETHRAL RESECTION OF BLADDER TUMOR (TURBT) BLADDER INSTILLATION OF GEMCITABINE CYSTOSCOPY WITH RETROGRADE PYELOGRAM (Bilateral)  Patient location during evaluation: PACU Anesthesia Type: General Level of consciousness: awake and alert Pain management: pain level controlled Vital Signs Assessment: post-procedure vital signs reviewed and stable Respiratory status: spontaneous breathing, nonlabored ventilation, respiratory function stable and patient connected to nasal cannula oxygen Cardiovascular status: blood pressure returned to baseline and stable Postop Assessment: no apparent nausea or vomiting Anesthetic complications: no   No notable events documented.   Last Vitals:  Vitals:   05/02/22 1315 05/02/22 1316  BP: 112/63 (!) 108/55  Pulse: 79 87  Resp: 12 15  Temp:  37.1 C  SpO2: 93% 92%    Last Pain:  Vitals:   05/02/22 1316  TempSrc: Temporal  PainSc: 0-No pain                 Dimas Millin

## 2022-05-02 NOTE — Anesthesia Preprocedure Evaluation (Signed)
Anesthesia Evaluation  Patient identified by MRN, date of birth, ID band Patient awake    Reviewed: Allergy & Precautions, NPO status , Patient's Chart, lab work & pertinent test results  Airway Mallampati: III  TM Distance: >3 FB Neck ROM: full    Dental  (+) Missing, Chipped   Pulmonary COPD, former smoker,    Pulmonary exam normal        Cardiovascular hypertension, + CAD, + Past MI and + DOE  Normal cardiovascular exam     Neuro/Psych PSYCHIATRIC DISORDERS negative neurological ROS     GI/Hepatic negative GI ROS, Neg liver ROS,   Endo/Other  negative endocrine ROS  Renal/GU      Musculoskeletal   Abdominal   Peds  Hematology negative hematology ROS (+)   Anesthesia Other Findings PMH of lung resection  Past Medical History: 12/07/2021: Adenoma of right adrenal gland     Comment:  a.) CT chest 12/07/2021; measured 2.3 cm; not FDG avid               on 01/06/2022 PET CT. No date: Anxiety     Comment:  a.) uses BZO (alprazolam) PRN No date: Aortic atherosclerosis ([place]) No date: Arthritis 12/07/2021: Bladder mass     Comment:  a.) CT abd 12/07/2021 --> 9 x 8 mm nodular enhancing               focus at the base of the bladder; suspicious for               urothelial neoplasm. No date: COPD (chronic obstructive pulmonary disease) ([place]) 10/16/2009: Coronary artery disease     Comment:  a.) LHC/PCI 10/16/2009: EF 60%; 50% OM1, 99% dRCA (3.0 x              18 mm Xience V DES). b.) LHC 07/31/2014: EF 65%; 40%               pLAD, 20% pLCx, 40% OM1, 30% oRCA, 50% pRCA, 20% mRCA,               10% ISR stent to dRCA; further intervention deferred               opting for med. mgmt. No date: Depression 03/02/2022: Difficult intubation No date: DOE (dyspnea on exertion) 02/01/2018: Ductal carcinoma in situ (DCIS) of right breast     Comment:  a.) high grade DCIS; comedo type. b.) s/p lumpectomy,                adjuvant XRT; currently on extended AI (anastrozole)               therapy No date: Essential hypertension No date: GERD (gastroesophageal reflux disease) No date: HLD (hyperlipidemia) 12/07/2021: Left thyroid nodule     Comment:  a.) CT chest; measured 2.6 cm No date: Long term current use of immunosuppressive drug     Comment:  a.) MTX for RA. No date: OSA on CPAP No date: Osteopenia No date: Personal history of radiation therapy No date: Pneumonia 07/14/2014: PRES (posterior reversible encephalopathy syndrome) 11/08/2021: Pulmonary nodules     Comment:  a.) CXR 11/08/2021: 36m nodular density RUL. b.) CT               chest 12/07/2021: 1.9 x 1.4 x 2.5 spiculated lesion               anterior RUL. c.) PET CT 01/06/2022: 2.2 x 1.9 spiculated  anterior RUL mass (SUV max 16.4); d.) Bx (+) NSCLC,               favoring SCC with tumor necrosis; p40/CK7/GATA3 (+),               TTF-1 (-) No date: Rheumatoid arthritis ([place])     Comment:  a.) on MTX No date: Seasonal allergies 2014: Seizures ([place])     Comment:  a.) x 1 episode; etiology never determined. No date: Sinus headache 03/17/2022: Squamous cell lung cancer, right ([place])     Comment:  a.) RUL lobectomy 03/17/2022 --> Bx (+) for invasive               moderately differentiated SCC of the RIGHT upper lobe               (PDL-1: 5%) --> stage IIIA SCC (pT3, pN1, cM0) 10/16/2009: ST elevation myocardial infarction (STEMI) of inferior  wall ([facility])     Comment:  a.) LHC/PCI 10/16/2009: EF 60%; 50% OM1, 99% dRCA (3.0 x              18 mm Xience V DES). No date: Tubular adenoma No date: Valvular heart disease  Past Surgical History: No date: ABDOMINAL HYSTERECTOMY 01/20/2014: ANTERIOR CERVICAL DECOMP/DISCECTOMY FUSION; N/A     Comment:  Procedure: ANTERIOR CERVICAL DECOMPRESSION/DISCECTOMY               FUSION 2 LEVELS cervical five/six six Tarry Kos;  Surgeon:               Ophelia Charter, MD;  Location: [facility];   Service:              Neurosurgery;  Laterality: N/A; No date: APPENDECTOMY No date: BACK SURGERY     Comment:  lumbar 02/01/2018: BREAST BIOPSY; Right     Comment:  x shape, DCIS  01/16/2019: BREAST BIOPSY     Comment:  coil, BENIGN MAMMARY TISSUE WITH MODERATE STROMAL               FIBROSIS AND COARSE DYSTROPHIC CALCIFICATIONS of the               RIGHT breast 03/07/2018: BREAST LUMPECTOMY; Right     Comment:  Procedure: BREAST LUMPECTOMY/ RE EXCISION;  Surgeon:               Herbert Pun, MD;  Location: [facility];  Service:              General;  Laterality: Right; 07/31/2014: CARDIAC CATHETERIZATION; Left     Comment:  Procedure: CARDIAC CATHETERIZATION; Location: [facility];               Surgeon: Serafina Royals, MD 02/11/2021: COLONOSCOPY WITH PROPOFOL; N/A     Comment:  Procedure: COLONOSCOPY WITH PROPOFOL;  Surgeon: Lucilla Lame, MD;  Location: [facility];  Service:               Endoscopy;  Laterality: N/A;  Requests Early 10/16/2009: CORONARY ANGIOPLASTY WITH STENT PLACEMENT; Left     Comment:  Procedure: CORONARY ANGIOPLASTY WITH STENT PLACEMENT;               Location: [facility]; Surgeon: Katrine Coho, MD 03/17/2022: INTERCOSTAL NERVE BLOCK; Right     Comment:  Procedure: INTERCOSTAL NERVE BLOCK;  Surgeon:  Melrose Nakayama, MD;  Location: [facility];  Service:               Thoracic;  Laterality: Right; No date: LASIK; Bilateral 03/17/2022: LUNG LOBECTOMY; Right     Comment:  right upper lobe 03/17/2022: LYMPH NODE DISSECTION; Right     Comment:  Procedure: LYMPH NODE DISSECTION;  Surgeon: Melrose Nakayama, MD;  Location: [facility];  Service: Thoracic;                Laterality: Right; 02/21/2018: PARTIAL MASTECTOMY WITH NEEDLE LOCALIZATION; Right     Comment:  Procedure: PARTIAL MASTECTOMY WITH NEEDLE LOCALIZATION;               Surgeon: Herbert Pun, MD;  Location: [facility];               Service:  General;  Laterality: Right; 02/11/2021: POLYPECTOMY; N/A     Comment:  Procedure: POLYPECTOMY;  Surgeon: Lucilla Lame, MD;                Location: [facility];  Service: Endoscopy;                Laterality: N/A; No date: ROTATOR CUFF REPAIR; Left 08/07/2019: SHOULDER ARTHROSCOPY WITH ROTATOR CUFF REPAIR AND OPEN  BICEPS TENODESIS; Right     Comment:  Procedure: SHOULDER ARTHROSCOPY WITH DEBRIDEMENT,               DECOMPRESSION, ROTATOR CUFF REPAIR AND BICEPS TENODESIS.               - RNFA;  Surgeon: Corky Mull, MD;  Location: [facility];              Service: Orthopedics;  Laterality: Right; No date: TOE SURGERY; Right     Comment:  has pin and plate in it 61/44/3154: [place]; N/A     Comment:  Procedure: VIDEO BRONCHOSCOPY WITH ENDOBRONCHIAL               ULTRASOUND;  Surgeon: Tyler Pita, MD;  Location:               [facility];  Service: Cardiopulmonary;  Laterality: N/A;  BMI    Body Mass Index: 26.95 kg/m      Reproductive/Obstetrics negative OB ROS                            Anesthesia Physical Anesthesia Plan  ASA: 3  Anesthesia Plan:    Post-op Pain Management:    Induction: Intravenous  PONV Risk Score and Plan: 4 or greater and Dexamethasone, Ondansetron, Midazolam and Treatment may vary due to age or medical condition  Airway Management Planned: LMA  Additional Equipment:   Intra-op Plan:   Post-operative Plan: Extubation in OR  Informed Consent: I have reviewed the patients History and Physical, chart, labs and discussed the procedure including the risks, benefits and alternatives for the proposed anesthesia with the patient or authorized representative who has indicated his/her understanding and acceptance.     Dental Advisory Given  Plan Discussed with: Anesthesiologist, CRNA and Surgeon  Anesthesia Plan Comments: (Patient consented for risks of anesthesia including  but not limited to:  - adverse reactions to medications - damage to eyes, teeth, lips or other oral mucosa - nerve damage due to positioning  - sore throat  or hoarseness - Damage to heart, brain, nerves, lungs, other parts of body or loss of life  Patient voiced understanding.)       Anesthesia Quick Evaluation

## 2022-05-02 NOTE — Anesthesia Procedure Notes (Signed)
Procedure Name: LMA Insertion Date/Time: 05/02/2022 11:40 AM  Performed by: Jerrye Noble, CRNAPre-anesthesia Checklist: Patient identified, Emergency Drugs available, Suction available and Patient being monitored Patient Re-evaluated:Patient Re-evaluated prior to induction Oxygen Delivery Method: Circle system utilized Preoxygenation: Pre-oxygenation with 100% oxygen Induction Type: IV induction Ventilation: Mask ventilation without difficulty LMA: LMA inserted LMA Size: 4.0 Number of attempts: 1 Placement Confirmation: positive ETCO2 and breath sounds checked- equal and bilateral Tube secured with: Tape Dental Injury: Teeth and Oropharynx as per pre-operative assessment

## 2022-05-02 NOTE — Transfer of Care (Signed)
Immediate Anesthesia Transfer of Care Note  Patient: [name]  Procedure(s) Performed: TRANSURETHRAL RESECTION OF BLADDER TUMOR (TURBT) BLADDER INSTILLATION OF GEMCITABINE CYSTOSCOPY WITH RETROGRADE PYELOGRAM (Bilateral)  Patient Location: PACU  Anesthesia Type:General  Level of Consciousness: drowsy  Airway & Oxygen Therapy: Patient Spontanous Breathing and Patient connected to face mask oxygen  Post-op Assessment: Report given to RN and Post -op Vital signs reviewed and stable  Post vital signs: Reviewed and stable  Last Vitals:  Vitals Value Taken Time  BP 123/64 05/02/22 1215  Temp    Pulse 84 05/02/22 1218  Resp 25 05/02/22 1218  SpO2 100 % 05/02/22 1218  Vitals shown include unvalidated device data.  Last Pain:  Vitals:   05/02/22 0759  TempSrc: Temporal  PainSc: 5       Patients Stated Pain Goal: 2 (46/27/03 5009)  Complications: No notable events documented.

## 2022-05-02 NOTE — Op Note (Signed)
Date of procedure: 05/02/22  Preoperative diagnosis:  Bladder tumor  Postoperative diagnosis:  Same as above  Procedure: TURBT, small Bilateral retrograde pyelogram Instillation of intravesical gemcitabine  Surgeon: Hollice Espy, MD  Anesthesia: General  Complications: None  Intraoperative findings: Small tumor on right bladder neck, approximately 8 mm.  Bilateral retrogrades unremarkable.  EBL: Minimal  Specimens: Bladder tumor  Drains: 16 French Foley catheter  Indication: ANNTONETTE MADEWELL is a 66 y.o. patient with small bladder neck tumor.  After reviewing the management options for treatment, she elected to proceed with the above surgical procedure(s). We have discussed the potential benefits and risks of the procedure, side effects of the proposed treatment, the likelihood of the patient achieving the goals of the procedure, and any potential problems that might occur during the procedure or recuperation. Informed consent has been obtained.  Description of procedure:  The patient was taken to the operating room and general anesthesia was induced.  The patient was placed in the dorsal lithotomy position, prepped and draped in the usual sterile fashion, and preoperative antibiotics were administered. A preoperative time-out was performed.   A 26 French resectoscope was introduced per urethra into the bladder.  The bladder was carefully inspected.  There was a small tumor, less than a centimeter (approximately 8 mm) actually had a slightly more atrophic appearance than previously at the right bladder neck.  There was no other bladder tumors appreciated.  A 5 French open-ended ureteral catheter was inserted in each of the ureteral orifices and contrast was injected.  This revealed normal bilateral retrograde pyelograms without hydronephrosis or filling defects bilaterally.  Next, bipolar loop was then used using saline as the medium to resect the small bladder tumor at the bladder  neck.  The base of the tumor was fulgurated.  Hemostasis was excellent.  The scope was then removed.  The tumor was passed off the specimen.  A 16 French Foley catheter was then placed.  The patient was cleaned and dried, repositioned in supine position, reversed myesthesia, taken to the PACU in stable condition.  2000 mg of intravesical gemcitabine was instilled to the bladder and allowed to dwell for an hour.  This was well-tolerated.  The end of the hour, the chemo was drained and the Foley catheter was which was removed.  Plan: I will plan to call her with the pathology results.  Hollice Espy, M.D.

## 2022-05-03 ENCOUNTER — Encounter: Payer: Self-pay | Admitting: Urology

## 2022-05-03 ENCOUNTER — Ambulatory Visit
Admission: RE | Admit: 2022-05-03 | Discharge: 2022-05-03 | Disposition: A | Discharge status: Home / Self Care | Payer: Medicare HMO | Source: Ambulatory Visit | Attending: Thoracic Surgery (Cardiothoracic Vascular Surgery) | Admitting: Thoracic Surgery (Cardiothoracic Vascular Surgery)

## 2022-05-03 ENCOUNTER — Telehealth: Payer: Self-pay | Admitting: *Deleted

## 2022-05-03 ENCOUNTER — Ambulatory Visit (INDEPENDENT_AMBULATORY_CARE_PROVIDER_SITE_OTHER): Payer: Self-pay | Admitting: Thoracic Surgery (Cardiothoracic Vascular Surgery)

## 2022-05-03 VITALS — BP 117/73 | HR 88 | Resp 18 | Ht 60.0 in | Wt 136.0 lb

## 2022-05-03 DIAGNOSIS — C3490 Malignant neoplasm of unspecified part of unspecified bronchus or lung: Secondary | ICD-10-CM | POA: Diagnosis not present

## 2022-05-03 DIAGNOSIS — C3491 Malignant neoplasm of unspecified part of right bronchus or lung: Secondary | ICD-10-CM

## 2022-05-03 DIAGNOSIS — Z9889 Other specified postprocedural states: Secondary | ICD-10-CM | POA: Diagnosis not present

## 2022-05-03 DIAGNOSIS — Z902 Acquired absence of lung [part of]: Secondary | ICD-10-CM

## 2022-05-03 LAB — SURGICAL PATHOLOGY

## 2022-05-03 NOTE — Telephone Encounter (Signed)
Form received from patient (for life insurance company) with return envelope.  Initiated paperwork and placed in Dr. Domingo Dimes folder for completion.

## 2022-05-03 NOTE — Progress Notes (Signed)
[address]       [place] [address]             [phone]    HPI: Mrs. Michelle Moore returns for a scheduled follow-up visit  Michelle Moore is a 66 year old woman with a history of tobacco abuse, COPD, CAD, MI, thoracic aortic atherosclerosis, hypertension, hyperlipidemia, reflux, anxiety, depression, bladder cancer, DCIS of the breast, thyroid nodule, depression, and stage IIIa lung cancer.  She was found to have a spiculated nodule in the right upper lobe earlier this year.  On PET/CT the nodule was hypermetabolic.  There was a questionable rib lesion on PET but follow-up CT showed no progression in nothing to suggest it was a pathologic fracture.  I did a right upper lobectomy with extrapleural dissection on 03/17/2022.  She went home on day 5.  Saw her back in July.  She was having some intercostal neuralgia pain.  She was on gabapentin 100 mg twice daily.  I increase that to 300 mg twice daily.  In the interim since that visit she had a transurethral procedure for the bladder tumor.  She also started chemotherapy.  She tolerated the first cycle well.  Her pain has improved significantly.  She still has some point tenderness at the posterior most incision, but otherwise feels well.  She wants to decrease her gabapentin because she has a dry mouth all the time.  Past Medical History:  Diagnosis Date   Adenoma of right adrenal gland 12/07/2021   a.) CT chest 12/07/2021; measured 2.3 cm; not FDG avid on 01/06/2022 PET CT.   Anxiety    a.) uses BZO (alprazolam) PRN   Aortic atherosclerosis (HCC)    Arthritis    Bladder mass 12/07/2021   a.) CT abd 12/07/2021 --> 9 x 8 mm nodular enhancing focus at the base of the bladder; suspicious for urothelial neoplasm.   COPD (chronic obstructive pulmonary disease) (HCC)    Coronary artery disease 10/16/2009   a.) LHC/PCI 10/16/2009: EF 60%; 50% OM1, 99% dRCA (3.0 x 18 mm Xience V DES). b.) LHC 07/31/2014: EF 65%; 40% pLAD, 20%  pLCx, 40% OM1, 30% oRCA, 50% pRCA, 20% mRCA, 10% ISR stent to dRCA; further intervention deferred opting for med. mgmt.   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 b.) s/p lumpectomy, adjuvant XRT; currently on extended AI (anastrozole) therapy   Essential hypertension    GERD (gastroesophageal reflux disease)    HLD (hyperlipidemia)    Left thyroid nodule 12/07/2021   a.) CT chest; measured 2.6 cm   Long term current use of immunosuppressive drug    a.) MTX for RA.   OSA on CPAP    Osteopenia    Personal history of radiation therapy    Pneumonia    PRES (posterior reversible encephalopathy syndrome) 07/14/2014   Pulmonary nodules 11/08/2021   a.) CXR 11/08/2021: 13m nodular density RUL. b.) CT chest 12/07/2021: 1.9 x 1.4 x 2.5 spiculated lesion anterior RUL. c.) PET CT 01/06/2022: 2.2 x 1.9 spiculated anterior RUL mass (SUV max 16.4); d.) Bx (+) NSCLC, favoring SCC with tumor necrosis; p40/CK7/GATA3 (+), TTF-1 (-)   Rheumatoid arthritis ([facility]    a.) on MTX   Seasonal allergies    Seizures ([place] 2014   a.) x 1 episode; etiology never determined.   Sinus headache  Squamous cell lung cancer, right ([place]) 03/17/2022   a.) RUL lobectomy 03/17/2022 --> Bx (+) for invasive moderately differentiated SCC of the RIGHT upper lobe (PDL-1: 5%) --> stage IIIA SCC (pT3, pN1, cM0)   ST elevation myocardial infarction (STEMI) of inferior wall ([place]) 10/16/2009   a.) LHC/PCI 10/16/2009: EF 60%; 50% OM1, 99% dRCA (3.0 x 18 mm Xience V DES).   Tubular adenoma    Valvular heart disease     Current Outpatient Medications  Medication Sig Dispense Refill   albuterol (VENTOLIN HFA) 108 (90 Base) MCG/ACT inhaler Inhale 1-2 puffs into the lungs every 6 (six) hours as needed for wheezing or shortness of breath. 1 g 1   alendronate (FOSAMAX) 70 MG tablet Take 1 tablet (70 mg  total) by mouth once a week. Take with a full glass of water on an empty stomach. Stand or sit upright for 1 hour after taking (Patient taking differently: Take 70 mg by mouth once a week. Take with a full glass of water on an empty stomach. Stand or sit upright for 1 hour after taking WEDNESDAY) 12 tablet 1   ALPRAZolam (XANAX) 0.25 MG tablet Take 1 tablet (0.25 mg total) by mouth 2 (two) times daily as needed for anxiety. (Patient taking differently: Take 0.25 mg by mouth at bedtime.) 60 tablet 1   anastrozole (ARIMIDEX) 1 MG tablet Take 1 tablet (1 mg total) by mouth at bedtime. 30 tablet 0   aspirin-acetaminophen-caffeine (EXCEDRIN MIGRAINE) 768-115-72 MG tablet Take by mouth every 6 (six) hours as needed for headache.     Budeson-Glycopyrrol-Formoterol (BREZTRI AEROSPHERE) 160-9-4.8 MCG/ACT AERO Inhale 2 puffs into the lungs in the morning and at bedtime. 10.7 g 11   fluticasone (FLONASE) 50 MCG/ACT nasal spray Place 2 sprays into both nostrils daily as needed for allergies. 60 mL 1   folic acid (FOLVITE) 1 MG tablet Take 2 mg by mouth in the morning.     gabapentin (NEURONTIN) 300 MG capsule Take 1 capsule (300 mg total) by mouth 2 (two) times daily. 60 capsule 6   HYDROcodone-acetaminophen (NORCO/VICODIN) 5-325 MG tablet Take 1-2 tablets by mouth every 6 (six) hours as needed for moderate pain. 10 tablet 0   methotrexate (RHEUMATREX) 2.5 MG tablet Take 20 mg by mouth once a week. WEDNESDAY     montelukast (SINGULAIR) 10 MG tablet Take 1 tablet (10 mg total) by mouth at bedtime. 90 tablet 1   omeprazole (PRILOSEC) 40 MG capsule Take 1 capsule (40 mg total) by mouth daily. 30 capsule 3   oxybutynin (DITROPAN) 5 MG tablet Take 1 tablet (5 mg total) by mouth every 8 (eight) hours as needed for bladder spasms. 30 tablet 0   potassium chloride (KLOR-CON) 10 MEQ tablet TAKE (1) TABLET BY MOUTH EVERY DAY 90 tablet 1   rosuvastatin (CRESTOR) 40 MG tablet Take 40 mg by mouth every evening.      sertraline (ZOLOFT) 100 MG tablet Take 1 tablet (100 mg total) by mouth daily. 30 tablet 3   traMADol (ULTRAM) 50 MG tablet TAKE (1) TABLET BY MOUTH THREE TIMES A DAY AS NEEDED     No current facility-administered medications for this visit.    Physical Exam BP 117/73 (BP Location: Left Arm, Patient Position: Sitting)   Pulse 88   Resp 18   Ht 5' (1.524 m)   Wt 136 lb (61.7 kg)   SpO2 95% Comment: RA  BMI 26.71 kg/m  66 year old woman in no acute distress Alert and oriented  x3 with no focal deficits Lungs slightly diminished at right base but otherwise clear bilaterally Incisions clean dry and intact  Diagnostic Tests: I personally reviewed her chest x-ray.  Shows postoperative changes.  No concerning findings.  Impression: Michelle Moore is a 66 year old woman with a history of tobacco abuse, COPD, CAD, MI, thoracic aortic atherosclerosis, hypertension, hyperlipidemia, reflux, anxiety, depression, bladder cancer, DCIS of the breast, thyroid nodule, depression, and stage IIIa lung cancer.  She is now about 6 weeks out from a right upper lobectomy with extrapleural resection.  She did have 1 positive node and staged as a T3, N1 stage IIIa.  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  She really is not having the paresthesias anymore.  Does still have some pain posteriorly.  Advised her to decrease her gabapentin back to her baseline dose.  If she starts having paresthesias again she should go back to 300 mg twice daily.  She has started chemotherapy with Dr. Grayland Ormond.  Overall she looks great.  Plan: Decrease gabapentin to 100 mg twice daily Return in 6 months with PA and lateral chest x-ray  Melrose Nakayama, MD [facility] [phone]

## 2022-05-04 ENCOUNTER — Other Ambulatory Visit: Payer: Self-pay

## 2022-05-05 ENCOUNTER — Other Ambulatory Visit: Payer: Self-pay

## 2022-05-05 ENCOUNTER — Encounter: Payer: Self-pay | Admitting: Pulmonary Disease

## 2022-05-05 MED ORDER — FLUTICASONE-SALMETEROL 250-50 MCG/ACT IN AEPB: 1.0000 | INHALATION_SPRAY | Freq: Two times a day (BID) | RESPIRATORY_TRACT | 5 refills | Status: DC

## 2022-05-05 NOTE — Telephone Encounter (Signed)
Dr. Gonzalez, please advise. thanks 

## 2022-05-08 ENCOUNTER — Encounter: Payer: Self-pay | Admitting: Pulmonary Disease

## 2022-05-09 ENCOUNTER — Encounter: Payer: Self-pay | Admitting: Urology

## 2022-05-09 NOTE — Progress Notes (Signed)
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  INTERVAL HISTORY: Patient returns to clinic today for further evaluation and consideration of cycle 2 of Taxotere and cisplatin.  Her fatigue has resolved and she feels nearly back to her baseline.  She currently feels well.  She has no neurologic complaints.  She denies any recent fevers or illnesses. She has a good appetite and denies weight loss.  She denies any chest pain, shortness of breath, cough, or hemoptysis.  She denies any nausea, vomiting, constipation, or diarrhea. She has no urinary complaints.  Patient offers no further specific complaints today.  REVIEW OF SYSTEMS:   Review of Systems  Constitutional: Negative.  Negative for fever, malaise/fatigue and weight loss.  Respiratory: Negative.  Negative for cough, hemoptysis and shortness of breath.   Cardiovascular: Negative.  Negative for chest pain and leg swelling.  Gastrointestinal: Negative.  Negative for abdominal pain.  Genitourinary: Negative.  Negative for dysuria.  Musculoskeletal: Negative.  Negative for back pain and myalgias.  Skin: Negative.  Negative for rash.  Neurological: Negative.  Negative for dizziness, sensory change, focal weakness and weakness.  Psychiatric/Behavioral: Negative.  The patient is not nervous/anxious.     As per HPI. Otherwise, a complete review of systems is negative.  PAST MEDICAL HISTORY: Past Medical History:  Diagnosis Date   Adenoma of right adrenal gland 12/07/2021   a.) CT chest 12/07/2021; measured 2.3 cm; not FDG avid on 01/06/2022  PET CT.   Anxiety    a.) uses BZO (alprazolam) PRN   Aortic atherosclerosis (HCC)    Arthritis    Bladder mass 12/07/2021   a.) CT abd 12/07/2021 --> 9 x 8 mm nodular enhancing focus at the base of the bladder; suspicious for urothelial neoplasm.   COPD (chronic obstructive pulmonary disease) (HCC)    Coronary artery disease 10/16/2009   a.) LHC/PCI 10/16/2009: EF 60%; 50% OM1, 99% dRCA (3.0 x 18 mm Xience V DES). b.) LHC 07/31/2014: EF 65%; 40% pLAD, 20% pLCx, 40% OM1, 30% oRCA, 50% pRCA, 20% mRCA, 10% ISR stent to dRCA; further intervention deferred opting for med. mgmt.   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 b.) s/p lumpectomy, adjuvant XRT; currently on extended AI (anastrozole) therapy   Essential hypertension    GERD (gastroesophageal reflux disease)    HLD (hyperlipidemia)    Left thyroid nodule 12/07/2021   a.) CT chest; measured 2.6 cm   Long term current use of immunosuppressive drug    a.) MTX for RA.   OSA on CPAP    Osteopenia    Personal history of radiation therapy    Pneumonia    PRES (posterior reversible encephalopathy syndrome) 07/14/2014   Pulmonary nodules 11/08/2021   a.) CXR 11/08/2021: 10m nodular density RUL. b.) CT chest 12/07/2021: 1.9 x 1.4 x 2.5 spiculated lesion anterior RUL. c.) PET CT 01/06/2022: 2.2 x 1.9 spiculated anterior RUL mass (SUV max 16.4); d.) Bx (+) NSCLC, favoring SCC with tumor  necrosis; p40/CK7/GATA3 (+), TTF-1 (-)   Rheumatoid arthritis ([place])    a.) on MTX   Seasonal allergies    Seizures ([place]) 2014   a.) x 1 episode; etiology never determined.   Sinus headache    Squamous cell lung cancer, right ([place]) 03/17/2022   a.) RUL lobectomy 03/17/2022 --> Bx (+) for invasive moderately differentiated SCC of the RIGHT upper lobe (PDL-1: 5%) --> stage IIIA SCC (pT3, pN1, cM0)   ST elevation myocardial  infarction (STEMI) of inferior wall ([place]) 10/16/2009   a.) LHC/PCI 10/16/2009: EF 60%; 50% OM1, 99% dRCA (3.0 x 18 mm Xience V DES).   Tubular adenoma    Valvular heart disease     PAST SURGICAL HISTORY: Past Surgical History:  Procedure Laterality Date   ABDOMINAL HYSTERECTOMY     ANTERIOR CERVICAL DECOMP/DISCECTOMY FUSION N/A 01/20/2014   Procedure: ANTERIOR CERVICAL DECOMPRESSION/DISCECTOMY FUSION 2 LEVELS cervical five/six six Tarry Kos;  Surgeon: Ophelia Charter, MD;  Location: [facility];  Service: Neurosurgery;  Laterality: N/A;   APPENDECTOMY     BACK SURGERY     lumbar   BLADDER INSTILLATION N/A 05/02/2022   Procedure: BLADDER INSTILLATION OF GEMCITABINE;  Surgeon: Hollice Espy, MD;  Location: [facility];  Service: Urology;  Laterality: N/A;   BREAST BIOPSY Right 02/01/2018   x shape, DCIS    BREAST BIOPSY  01/16/2019   coil, BENIGN MAMMARY TISSUE WITH MODERATE STROMAL FIBROSIS AND COARSE DYSTROPHIC CALCIFICATIONS of the RIGHT breast   BREAST LUMPECTOMY Right 03/07/2018   Procedure: BREAST LUMPECTOMY/ RE EXCISION;  Surgeon: Herbert Pun, MD;  Location: [facility];  Service: General;  Laterality: Right;   CARDIAC CATHETERIZATION Left 07/31/2014   Procedure: CARDIAC CATHETERIZATION; Location: [facility]; Surgeon: Serafina Royals, MD   COLONOSCOPY WITH PROPOFOL N/A 02/11/2021   Procedure: COLONOSCOPY WITH PROPOFOL;  Surgeon: Lucilla Lame, MD;  Location: [facility];  Service: Endoscopy;  Laterality: N/A;  Requests Early   CORONARY ANGIOPLASTY WITH STENT PLACEMENT Left 10/16/2009   Procedure: CORONARY ANGIOPLASTY WITH STENT PLACEMENT; Location: [facility]; Surgeon: Katrine Coho, MD   CYSTOSCOPY W/ RETROGRADES Bilateral 05/02/2022   Procedure: CYSTOSCOPY WITH RETROGRADE PYELOGRAM;  Surgeon: Hollice Espy, MD;  Location: [facility];  Service: Urology;  Laterality: Bilateral;   INTERCOSTAL NERVE BLOCK Right 03/17/2022   Procedure: INTERCOSTAL NERVE BLOCK;  Surgeon: Melrose Nakayama, MD;  Location: [facility];  Service: Thoracic;  Laterality: Right;   LASIK Bilateral    LUNG LOBECTOMY Right 03/17/2022   right upper lobe   LYMPH NODE DISSECTION Right 03/17/2022   Procedure: LYMPH NODE DISSECTION;  Surgeon: Melrose Nakayama, MD;  Location: [facility];  Service: Thoracic;  Laterality: Right;   PARTIAL MASTECTOMY WITH NEEDLE LOCALIZATION Right 02/21/2018   Procedure: PARTIAL MASTECTOMY WITH NEEDLE LOCALIZATION;  Surgeon: Herbert Pun, MD;  Location: [facility];  Service: General;  Laterality: Right;   POLYPECTOMY N/A 02/11/2021   Procedure: POLYPECTOMY;  Surgeon: Lucilla Lame, MD;  Location: [facility];  Service: Endoscopy;  Laterality: N/A;   ROTATOR CUFF REPAIR Left    SHOULDER ARTHROSCOPY WITH ROTATOR CUFF REPAIR AND OPEN BICEPS TENODESIS Right 08/07/2019   Procedure: SHOULDER ARTHROSCOPY WITH DEBRIDEMENT, DECOMPRESSION, ROTATOR CUFF REPAIR AND BICEPS TENODESIS. - RNFA;  Surgeon: Corky Mull, MD;  Location: [facility];  Service: Orthopedics;  Laterality: Right;   TOE SURGERY Right    has pin and plate in it   TRANSURETHRAL RESECTION OF BLADDER TUMOR N/A 05/02/2022   Procedure: TRANSURETHRAL RESECTION OF BLADDER TUMOR (TURBT);  Surgeon: Hollice Espy, MD;  Location: [facility];  Service: Urology;  Laterality: N/A;   VIDEO BRONCHOSCOPY WITH ENDOBRONCHIAL ULTRASOUND N/A 03/02/2022   Procedure: VIDEO BRONCHOSCOPY WITH ENDOBRONCHIAL ULTRASOUND;  Surgeon: Tyler Pita, MD;  Location: [facility];  Service: Cardiopulmonary;  Laterality: N/A;    FAMILY HISTORY: Family History  Problem Relation Age of Onset   Breast cancer Cousin 26       bilateral at 58 and 25; daughter of maternal aunt who was unaffected   Lung cancer Maternal Aunt        dx 48s; deceased 60s; smoker   Heart attack Father        deceased 49   Stomach cancer Maternal Grandmother     ADVANCED DIRECTIVES (Y/N):  N  HEALTH MAINTENANCE: Social History   Tobacco Use   Smoking  status: Former    Packs/day: 1.00    Years: 30.00    Total pack years: 30.00    Types: Cigarettes    Quit date: 09/13/2015    Years since quitting: 6.6   Smokeless tobacco: Never  Vaping Use   Vaping Use: Never used  Substance Use Topics   Alcohol use: Yes    Comment: occasional   Drug use: No     Colonoscopy:  PAP:  Bone density:  Lipid panel:  Allergies  Allergen Reactions   Other     BANDAIDS-OF LEFT ON FOR AN EXTENDED PERIOD OF TIME  Patient states latex rubber gloves are ok    Current Outpatient Medications  Medication Sig Dispense Refill   albuterol (VENTOLIN HFA) 108 (90 Base) MCG/ACT inhaler Inhale 1-2 puffs into the lungs every 6 (six) hours as needed for wheezing or shortness of breath. 1 g 1   alendronate (FOSAMAX) 70 MG tablet Take 1 tablet (70 mg total) by mouth once a week. Take with a full glass of water on an empty stomach. Stand or sit upright for 1 hour after taking (Patient taking differently: Take 70 mg by mouth once a week. Take with a full glass of water on an empty stomach. Stand or sit upright for 1 hour after taking WEDNESDAY) 12 tablet 1   ALPRAZolam (XANAX) 0.25 MG tablet Take 1 tablet (0.25 mg total) by mouth 2 (two) times daily as needed for anxiety. (Patient taking differently: Take 0.25 mg by mouth at bedtime.) 60 tablet 1   aspirin-acetaminophen-caffeine (EXCEDRIN MIGRAINE) 250-250-65 MG tablet Take by mouth every 6 (six) hours as needed for headache.     fluticasone (FLONASE) 50 MCG/ACT nasal spray Place 2 sprays into both nostrils daily as needed for allergies. 60 mL 1   fluticasone-salmeterol (ADVAIR) 250-50 MCG/ACT AEPB Inhale 1 puff into the lungs in the morning and at bedtime. 60 each 5   folic acid (FOLVITE) 1 MG tablet Take 2 mg by mouth in the morning.     gabapentin (NEURONTIN) 300 MG capsule Take 1 capsule (300 mg total) by mouth 2 (two) times daily. 60 capsule 6   methotrexate (RHEUMATREX) 2.5 MG tablet Take 20 mg by mouth once a week.  WEDNESDAY     montelukast (SINGULAIR) 10 MG tablet Take 1 tablet (10 mg total) by mouth at bedtime. 90 tablet 1   omeprazole (PRILOSEC) 40 MG capsule Take 1 capsule (40 mg total) by mouth daily. 30 capsule 3   oxybutynin (DITROPAN) 5 MG tablet Take 1 tablet (5 mg total) by mouth every 8 (eight) hours as needed for bladder spasms. 30 tablet 0   potassium  chloride (KLOR-CON) 10 MEQ tablet TAKE (1) TABLET BY MOUTH EVERY DAY 90 tablet 1   rosuvastatin (CRESTOR) 40 MG tablet Take 40 mg by mouth every evening.     sertraline (ZOLOFT) 100 MG tablet Take 1 tablet (100 mg total) by mouth daily. 30 tablet 3   traMADol (ULTRAM) 50 MG tablet TAKE (1) TABLET BY MOUTH THREE TIMES A DAY AS NEEDED     anastrozole (ARIMIDEX) 1 MG tablet Take 1 tablet (1 mg total) by mouth at bedtime. 30 tablet 0   HYDROcodone-acetaminophen (NORCO/VICODIN) 5-325 MG tablet Take 1-2 tablets by mouth every 6 (six) hours as needed for moderate pain. 10 tablet 0   No current facility-administered medications for this visit.    OBJECTIVE: Vitals:   05/10/22 0929  BP: 136/79  Pulse: 80  Resp: 16  Temp: 98.1 F (36.7 C)  SpO2: 100%     Body mass index is 27.34 kg/m.    ECOG FS:0 - Asymptomatic  General: Well-developed, well-nourished, no acute distress. Eyes: Pink conjunctiva, anicteric sclera. HEENT: Normocephalic, moist mucous membranes. Lungs: No audible wheezing or coughing. Heart: Regular rate and rhythm. Abdomen: Soft, nontender, no obvious distention. Musculoskeletal: No edema, cyanosis, or clubbing. Neuro: Alert, answering all questions appropriately. Cranial nerves grossly intact. Skin: No rashes or petechiae noted. Psych: Normal affect.   LAB RESULTS:  Lab Results  Component Value Date   NA 138 05/10/2022   K 4.1 05/10/2022   CL 105 05/10/2022   CO2 27 05/10/2022   GLUCOSE 114 (H) 05/10/2022   BUN 13 05/10/2022   CREATININE 0.69 05/10/2022   CALCIUM 8.8 (L) 05/10/2022   PROT 6.8 05/10/2022    ALBUMIN 3.7 05/10/2022   AST 16 05/10/2022   ALT 11 05/10/2022   ALKPHOS 97 05/10/2022   BILITOT 0.2 (L) 05/10/2022   GFRNONAA >60 05/10/2022   GFRAA >60 04/09/2020    Lab Results  Component Value Date   WBC 7.0 05/10/2022   NEUTROABS 4.4 05/10/2022   HGB 11.4 (L) 05/10/2022   HCT 36.6 05/10/2022   MCV 91.7 05/10/2022   PLT 220 05/10/2022     STUDIES: DG Chest 2 View  Result Date: 05/03/2022 CLINICAL DATA:  Lung cancer EXAM: CHEST - 2 VIEW COMPARISON:  Chest 04/05/2022 FINDINGS: Postop right upper lobectomy changes stable. No new area of infiltrate or collapse. No effusion. Improved aeration in the right middle lobe which is now clear IMPRESSION: Improved aeration right lung base. Postop changes right middle lobe stable Electronically Signed   By: Franchot Gallo M.D.   On: 05/03/2022 09:29   DG OR UROLOGY CYSTO IMAGE ([facility] ONLY)  Result Date: 05/02/2022 There is no interpretation for this exam.  This order is for images obtained during a surgical procedure.  Please See "Surgeries" Tab for more information regarding the procedure.   MR Brain W Wo Contrast  Result Date: 04/21/2022 CLINICAL DATA:  Squamous cell carcinoma of right lung. Metastatic disease evaluation. EXAM: MRI HEAD WITHOUT AND WITH CONTRAST TECHNIQUE: Multiplanar, multiecho pulse sequences of the brain and surrounding structures were obtained without and with intravenous contrast. CONTRAST:  102m GADAVIST GADOBUTROL 1 MMOL/ML IV SOLN COMPARISON:  No pertinent prior exams available for comparison. FINDINGS: Brain: Mild generalized parenchymal atrophy. Prominent perivascular spaces within the left deep gray nuclei. No cortical encephalomalacia is identified. No significant cerebral white matter disease for age. There is no acute infarct. No evidence of an intracranial mass. No chronic intracranial blood products. No extra-axial fluid collection. No midline shift.  No pathologic intracranial enhancement identified. Vascular:  Maintained flow voids within the proximal large arterial vessels. Skull and upper cervical spine: No focal suspicious marrow lesion. Sinuses/Orbits: No mass or acute finding within the imaged orbits. No significant paranasal sinus disease. IMPRESSION: No evidence of intracranial metastatic disease. Electronically Signed   By: Kellie Simmering D.O.   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  PLAN:    1.  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  She was also noted to have 1 of 33 lymph nodes positive for disease.  This increased her to a stage IIIa.  Have recommended adjuvant chemotherapy using cisplatin plus Taxotere every 3 weeks for 4 cycles with Udenyca support.  MRI of the brain on April 21, 2022 reviewed independently and report as above with no obvious evidence of metastatic disease.  Patient tolerated cycle 1 relatively well, much of her side effects may be related to the [facility] so this has been discontinued for cycle 2.  Proceed with cycle 2 of treatment today.  Return to clinic in 1 week for laboratory work only and then in 3 weeks for further evaluation and consideration of cycle 3.   2. DCIS, right breast: Patient underwent lumpectomy followed by adjuvant XRT completing in September 2019.  Because there was no invasive component on her pathology, she did not require adjuvant chemotherapy. Patient could not tolerate tamoxifen or letrozole secondary to worsening joint pain, therefore was switched to anastrozole.  She is tolerating this well and will complete 5 years of treatment in September 2024.  Her most recent mammogram on March 11, 2022 was reported BI-RADS 1.  Repeat in June 2024. 3.  Osteopenia: Patient's most recent bone mineral  density on March 25, 2021 revealed a T score of -2.3 which is slightly worse than previous where her T score was reported -1.9 and -1.4 and years prior.  Continue calcium, vitamin D, and alendronate.    4.  Surgical site tenderness, body aches: Continue gabapentin and tramadol as prescribed.  Hold Udenyca for cycle 2 as above.    Lloyd Huger, MD 05/13/2022 6:07 AM   Cancer Staging  Ductal carcinoma in situ (DCIS) of right breast Staging form: Breast, AJCC 8th Edition - Clinical: Stage 0 (cTis (DCIS), cN0, cM0, ER+, PR-, HER2-) - Signed by Lloyd Huger, MD on 03/18/2018 Nuclear grade: G3 Laterality: Right  Squamous cell carcinoma of right lung ([place]) Staging form: Lung, AJCC 8th Edition - Pathologic stage from 04/07/2022: Stage IIIA (pT3, pN1, cM0) - Signed by Lloyd Huger, MD on 04/07/2022

## 2022-05-10 ENCOUNTER — Inpatient Hospital Stay: Payer: Medicare HMO

## 2022-05-10 ENCOUNTER — Telehealth (INDEPENDENT_AMBULATORY_CARE_PROVIDER_SITE_OTHER): Payer: Medicare HMO | Admitting: Urology

## 2022-05-10 ENCOUNTER — Encounter: Payer: Self-pay | Admitting: Oncology

## 2022-05-10 ENCOUNTER — Inpatient Hospital Stay (HOSPITAL_BASED_OUTPATIENT_CLINIC_OR_DEPARTMENT_OTHER): Payer: Medicare HMO | Admitting: Oncology

## 2022-05-10 ENCOUNTER — Encounter: Payer: Self-pay | Admitting: *Deleted

## 2022-05-10 VITALS — BP 136/79 | HR 80 | Temp 98.1°F | Resp 16 | Wt 140.0 lb

## 2022-05-10 DIAGNOSIS — R5383 Other fatigue: Secondary | ICD-10-CM | POA: Diagnosis not present

## 2022-05-10 DIAGNOSIS — Z79811 Long term (current) use of aromatase inhibitors: Secondary | ICD-10-CM | POA: Diagnosis not present

## 2022-05-10 DIAGNOSIS — C3411 Malignant neoplasm of upper lobe, right bronchus or lung: Secondary | ICD-10-CM | POA: Diagnosis not present

## 2022-05-10 DIAGNOSIS — R52 Pain, unspecified: Secondary | ICD-10-CM | POA: Diagnosis not present

## 2022-05-10 DIAGNOSIS — D0511 Intraductal carcinoma in situ of right breast: Secondary | ICD-10-CM | POA: Diagnosis not present

## 2022-05-10 DIAGNOSIS — G8928 Other chronic postprocedural pain: Secondary | ICD-10-CM | POA: Diagnosis not present

## 2022-05-10 DIAGNOSIS — C3491 Malignant neoplasm of unspecified part of right bronchus or lung: Secondary | ICD-10-CM | POA: Diagnosis not present

## 2022-05-10 DIAGNOSIS — D494 Neoplasm of unspecified behavior of bladder: Secondary | ICD-10-CM | POA: Diagnosis not present

## 2022-05-10 DIAGNOSIS — Z902 Acquired absence of lung [part of]: Secondary | ICD-10-CM | POA: Diagnosis not present

## 2022-05-10 DIAGNOSIS — Z5111 Encounter for antineoplastic chemotherapy: Secondary | ICD-10-CM | POA: Diagnosis not present

## 2022-05-10 DIAGNOSIS — Z5189 Encounter for other specified aftercare: Secondary | ICD-10-CM | POA: Diagnosis not present

## 2022-05-10 LAB — CBC WITH DIFFERENTIAL/PLATELET
Abs Immature Granulocytes: 0.05 10*3/uL (ref 0.00–0.07)
Basophils Absolute: 0.1 10*3/uL (ref 0.0–0.1)
Basophils Relative: 1 %
Eosinophils Absolute: 0 10*3/uL (ref 0.0–0.5)
Eosinophils Relative: 0 %
HCT: 36.6 % (ref 36.0–46.0)
Hemoglobin: 11.4 g/dL — ABNORMAL LOW (ref 12.0–15.0)
Immature Granulocytes: 1 %
Lymphocytes Relative: 26 %
Lymphs Abs: 1.8 10*3/uL (ref 0.7–4.0)
MCH: 28.6 pg (ref 26.0–34.0)
MCHC: 31.1 g/dL (ref 30.0–36.0)
MCV: 91.7 fL (ref 80.0–100.0)
Monocytes Absolute: 0.7 10*3/uL (ref 0.1–1.0)
Monocytes Relative: 10 %
Neutro Abs: 4.4 10*3/uL (ref 1.7–7.7)
Neutrophils Relative %: 62 %
Platelets: 220 10*3/uL (ref 150–400)
RBC: 3.99 MIL/uL (ref 3.87–5.11)
RDW: 14.5 % (ref 11.5–15.5)
WBC: 7 10*3/uL (ref 4.0–10.5)
nRBC: 0 % (ref 0.0–0.2)

## 2022-05-10 LAB — COMPREHENSIVE METABOLIC PANEL
ALT: 11 U/L (ref 0–44)
AST: 16 U/L (ref 15–41)
Albumin: 3.7 g/dL (ref 3.5–5.0)
Alkaline Phosphatase: 97 U/L (ref 38–126)
Anion gap: 6 (ref 5–15)
BUN: 13 mg/dL (ref 8–23)
CO2: 27 mmol/L (ref 22–32)
Calcium: 8.8 mg/dL — ABNORMAL LOW (ref 8.9–10.3)
Chloride: 105 mmol/L (ref 98–111)
Creatinine, Ser: 0.69 mg/dL (ref 0.44–1.00)
GFR, Estimated: >60 mL/min (ref >60)
Glucose, Bld: 114 mg/dL — ABNORMAL HIGH (ref 70–99)
Potassium: 4.1 mmol/L (ref 3.5–5.1)
Sodium: 138 mmol/L (ref 135–145)
Total Bilirubin: 0.2 mg/dL — ABNORMAL LOW (ref 0.3–1.2)
Total Protein: 6.8 g/dL (ref 6.5–8.1)

## 2022-05-10 LAB — MAGNESIUM: Magnesium: 1.7 mg/dL (ref 1.7–2.4)

## 2022-05-10 NOTE — Progress Notes (Signed)
Pt in for follow up, reports lower back pain rating a 6 on pain scale.  Reports started having increased reflux and started taking prilosec 40 mg bid without relief.

## 2022-05-10 NOTE — Progress Notes (Unsigned)
This service is provided via telemedicine   No vital signs collected/recorded due to the encounter was a telemedicine visit.     Patient consents to a telephone visit:  Yes    Names of all persons participating in the telemedicine service and their role in the encounter:  Verlene Mayer, [place]

## 2022-05-10 NOTE — Telephone Encounter (Signed)
Spoke with patient scheduled appointment of results via [facility] visit. Reviewed instructions.

## 2022-05-11 ENCOUNTER — Inpatient Hospital Stay: Payer: Medicare HMO

## 2022-05-11 VITALS — BP 150/79 | HR 77 | Temp 97.3°F | Resp 18

## 2022-05-11 DIAGNOSIS — D0511 Intraductal carcinoma in situ of right breast: Secondary | ICD-10-CM | POA: Diagnosis not present

## 2022-05-11 DIAGNOSIS — C3411 Malignant neoplasm of upper lobe, right bronchus or lung: Secondary | ICD-10-CM | POA: Diagnosis not present

## 2022-05-11 DIAGNOSIS — Z5111 Encounter for antineoplastic chemotherapy: Secondary | ICD-10-CM | POA: Diagnosis not present

## 2022-05-11 DIAGNOSIS — Z902 Acquired absence of lung [part of]: Secondary | ICD-10-CM | POA: Diagnosis not present

## 2022-05-11 DIAGNOSIS — G8928 Other chronic postprocedural pain: Secondary | ICD-10-CM | POA: Diagnosis not present

## 2022-05-11 DIAGNOSIS — Z5189 Encounter for other specified aftercare: Secondary | ICD-10-CM | POA: Diagnosis not present

## 2022-05-11 DIAGNOSIS — Z79811 Long term (current) use of aromatase inhibitors: Secondary | ICD-10-CM | POA: Diagnosis not present

## 2022-05-11 DIAGNOSIS — C3491 Malignant neoplasm of unspecified part of right bronchus or lung: Secondary | ICD-10-CM

## 2022-05-11 DIAGNOSIS — R5383 Other fatigue: Secondary | ICD-10-CM | POA: Diagnosis not present

## 2022-05-11 DIAGNOSIS — R52 Pain, unspecified: Secondary | ICD-10-CM | POA: Diagnosis not present

## 2022-05-11 MED ORDER — POTASSIUM CHLORIDE IN NACL 20-0.9 MEQ/L-% IV SOLN
Freq: Once | INTRAVENOUS | Status: AC
  Filled 2022-05-11: qty 1000

## 2022-05-11 MED ORDER — SODIUM CHLORIDE 0.9 % IV SOLN
10.0000 mg | Freq: Once | INTRAVENOUS | Status: AC
  Administered 2022-05-11: 10 mg via INTRAVENOUS
  Filled 2022-05-11: qty 10

## 2022-05-11 MED ORDER — SODIUM CHLORIDE 0.9 % IV SOLN
75.0000 mg/m2 | Freq: Once | INTRAVENOUS | Status: AC
  Administered 2022-05-11: 122 mg via INTRAVENOUS
  Filled 2022-05-11: qty 50

## 2022-05-11 MED ORDER — MAGNESIUM SULFATE 2 GM/50ML IV SOLN
2.0000 g | Freq: Once | INTRAVENOUS | Status: AC
  Administered 2022-05-11: 2 g via INTRAVENOUS
  Filled 2022-05-11: qty 50

## 2022-05-11 MED ORDER — PALONOSETRON HCL INJECTION 0.25 MG/5ML
0.2500 mg | Freq: Once | INTRAVENOUS | Status: AC
  Administered 2022-05-11: 0.25 mg via INTRAVENOUS
  Filled 2022-05-11: qty 5

## 2022-05-11 MED ORDER — SODIUM CHLORIDE 0.9 % IV SOLN
75.0000 mg/m2 | Freq: Once | INTRAVENOUS | Status: AC
  Administered 2022-05-11: 120 mg via INTRAVENOUS
  Filled 2022-05-11: qty 12

## 2022-05-11 MED ORDER — SODIUM CHLORIDE 0.9 % IV SOLN
Freq: Once | INTRAVENOUS | Status: AC
  Filled 2022-05-11: qty 250

## 2022-05-11 MED ORDER — SODIUM CHLORIDE 0.9 % IV SOLN
150.0000 mg | Freq: Once | INTRAVENOUS | Status: AC
  Administered 2022-05-11: 150 mg via INTRAVENOUS
  Filled 2022-05-11: qty 150

## 2022-05-11 NOTE — Progress Notes (Signed)
Nutrition Follow-up:  Patient stage III SCC right lung cancer.  S/p[ right upper lobectomy.  Patient receiving chemotherapy  Met with patient during infusion.  Reports reflux recently preventing her from eating (on omeprazole).  For the past 2 days intake has improved.  Has tried protein shakes but have been too sweet for her.  Denies nausea.      Medications: reviewed  Labs: reviewed  Anthropometrics:   Weight 140 lb  141 lb on 8/9   NUTRITION DIAGNOSIS: Inadequate oral intake stable   INTERVENTION:  Samples of carnation breakfast essentials given to patient and orgain shake to try.     MONITORING, EVALUATION, GOAL: weight trends, intake   NEXT VISIT: Wednesday, Sept 20th during infusion  Kitt Ledet B. Zenia Resides, [place], [place] Registered Dietitian [phone]

## 2022-05-11 NOTE — Patient Instructions (Signed)
[facility] AT [place]  Discharge Instructions: Thank you for choosing [facility] to provide your oncology and hematology care.  If you have a lab appointment with the [facility], please go directly to the [facility] and check in at the registration area.  Wear comfortable clothing and clothing appropriate for easy access to any Portacath or PICC line.   We strive to give you quality time with your provider. You may need to reschedule your appointment if you arrive late (15 or more minutes).  Arriving late affects you and other patients whose appointments are after yours.  Also, if you miss three or more appointments without notifying the office, you may be dismissed from the clinic at the provider's discretion.      For prescription refill requests, have your pharmacy contact our office and allow 72 hours for refills to be completed.    Today you received the following chemotherapy and/or immunotherapy agents CISPLATIN and TAXOTERE      To help prevent nausea and vomiting after your treatment, we encourage you to take your nausea medication as directed.  BELOW ARE SYMPTOMS THAT SHOULD BE REPORTED IMMEDIATELY: *FEVER GREATER THAN 100.4 F (38 C) OR HIGHER *CHILLS OR SWEATING *NAUSEA AND VOMITING THAT IS NOT CONTROLLED WITH YOUR NAUSEA MEDICATION *UNUSUAL SHORTNESS OF BREATH *UNUSUAL BRUISING OR BLEEDING *URINARY PROBLEMS (pain or burning when urinating, or frequent urination) *BOWEL PROBLEMS (unusual diarrhea, constipation, pain near the anus) TENDERNESS IN MOUTH AND THROAT WITH OR WITHOUT PRESENCE OF ULCERS (sore throat, sores in mouth, or a toothache) UNUSUAL RASH, SWELLING OR PAIN  UNUSUAL VAGINAL DISCHARGE OR ITCHING   Items with * indicate a potential emergency and should be followed up as soon as possible or go to the Emergency Department if any problems should occur.  Please show the CHEMOTHERAPY ALERT CARD or IMMUNOTHERAPY ALERT CARD at  check-in to the Emergency Department and triage nurse.  Should you have questions after your visit or need to cancel or reschedule your appointment, please contact [facility] [place] AT [place]  [phone] and follow the prompts.  Office hours are 8:00 a.m. to 4:30 p.m. Monday - Friday. Please note that voicemails left after 4:00 p.m. may not be returned until the following business day.  We are closed weekends and major holidays. You have access to a nurse at all times for urgent questions. Please call the main number to the clinic [phone] and follow the prompts.  For any non-urgent questions, you may also contact your provider using MyChart. We now offer e-Visits for anyone 28 and older to request care online for non-urgent symptoms. For details visit [email or web address].   Also download the MyChart app! Go to the app store, search "MyChart", open the app, select [facility], and log in with your MyChart username and password.  Masks are optional in the cancer centers. If you would like for your care team to wear a mask while they are taking care of you, please let them know. For doctor visits, patients may have with them one support person who is at least 65 years old. At this time, visitors are not allowed in the infusion area.  Cisplatin Injection What is this medication? CISPLATIN (SIS pla tin) treats some types of cancer. It works by slowing down the growth of cancer cells. This medicine may be used for other purposes; ask your health care provider or pharmacist if you have questions. COMMON BRAND NAME(S): Platinol, Platinol -AQ What should I tell  my care team before I take this medication? They need to know if you have any of these conditions: Eye disease, vision problems Hearing problems Kidney disease Low blood counts, such as low white cells, platelets, or red blood cells Tingling of the fingers or toes, or other nerve disorder An unusual or allergic  reaction to cisplatin, carboplatin, oxaliplatin, other medications, foods, dyes, or preservatives If you or your partner are pregnant or trying to get pregnant Breast-feeding How should I use this medication? This medication is injected into a vein. It is given by your care team in a hospital or clinic setting. Talk to your care team about the use of this medication in children. Special care may be needed. Overdosage: If you think you have taken too much of this medicine contact a poison control center or emergency room at once. NOTE: This medicine is only for you. Do not share this medicine with others. What if I miss a dose? Keep appointments for follow-up doses. It is important not to miss your dose. Call your care team if you are unable to keep an appointment. What may interact with this medication? Do not take this medication with any of the following: Live virus vaccines This medication may also interact with the following: Certain antibiotics, such as amikacin, gentamicin, neomycin, polymyxin B, streptomycin, tobramycin, vancomycin Foscarnet This list may not describe all possible interactions. Give your health care provider a list of all the medicines, herbs, non-prescription drugs, or dietary supplements you use. Also tell them if you smoke, drink alcohol, or use illegal drugs. Some items may interact with your medicine. What should I watch for while using this medication? Your condition will be monitored carefully while you are receiving this medication. You may need blood work done while taking this medication. This medication may make you feel generally unwell. This is not uncommon, as chemotherapy can affect healthy cells as well as cancer cells. Report any side effects. Continue your course of treatment even though you feel ill unless your care team tells you to stop. This medication may increase your risk of getting an infection. Call your care team for advice if you get a fever,  chills, sore throat, or other symptoms of a cold or flu. Do not treat yourself. Try to avoid being around people who are sick. Avoid taking medications that contain aspirin, acetaminophen, ibuprofen, naproxen, or ketoprofen unless instructed by your care team. These medications may hide a fever. This medication may increase your risk to bruise or bleed. Call your care team if you notice any unusual bleeding. Be careful brushing or flossing your teeth or using a toothpick because you may get an infection or bleed more easily. If you have any dental work done, tell your dentist you are receiving this medication. Drink fluids as directed while you are taking this medication. This will help protect your kidneys. Call your care team if you get diarrhea. Do not treat yourself. Talk to your care team if you or your partner wish to become pregnant or think you might be pregnant. This medication can cause serious birth defects if taken during pregnancy and for 14 months after the last dose. A negative pregnancy test is required before starting this medication. A reliable form of contraception is recommended while taking this medication and for 14 months after the last dose. Talk to your care team about effective forms of contraception. Do not father a child while taking this medication and for 11 months after the last dose.  Use a condom during sex during this time period. Do not breast-feed while taking this medication. This medication may cause infertility. Talk to your care team if you are concerned about your fertility. What side effects may I notice from receiving this medication? Side effects that you should report to your care team as soon as possible: Allergic reactions--skin rash, itching, hives, swelling of the face, lips, tongue, or throat Eye pain, change in vision, vision loss Hearing loss, ringing in ears Infection--fever, chills, cough, sore throat, wounds that don't heal, pain or trouble when  passing urine, general feeling of discomfort or being unwell Kidney injury--decrease in the amount of urine, swelling of the ankles, hands, or feet Low red blood cell level--unusual weakness or fatigue, dizziness, headache, trouble breathing Painful swelling, warmth, or redness of the skin, blisters or sores at the infusion site Pain, tingling, or numbness in the hands or feet Unusual bruising or bleeding Side effects that usually do not require medical attention (report to your care team if they continue or are bothersome): Hair loss Nausea Vomiting This list may not describe all possible side effects. Call your doctor for medical advice about side effects. You may report side effects to FDA at 1-800-FDA-1088. Where should I keep my medication? This medication is given in a hospital or clinic. It will not be stored at home. NOTE: This sheet is a summary. It may not cover all possible information. If you have questions about this medicine, talk to your doctor, pharmacist, or health care provider.  2023 Elsevier/Gold Standard (2021-12-27 00:00:00)  Docetaxel Injection What is this medication? DOCETAXEL (doe se TAX el) treats some types of cancer. It works by slowing down the growth of cancer cells. This medicine may be used for other purposes; ask your health care provider or pharmacist if you have questions. COMMON BRAND NAME(S): Docefrez, Taxotere What should I tell my care team before I take this medication? They need to know if you have any of these conditions: Kidney disease Liver disease Low white blood cell levels Tingling of the fingers or toes or other nerve disorder An unusual or allergic reaction to docetaxel, polysorbate 80, other medications, foods, dyes, or preservatives Pregnant or trying to get pregnant Breast-feeding How should I use this medication? This medication is injected into a vein. It is given by your care team in a hospital or clinic setting. Talk to your  care team about the use of this medication in children. Special care may be needed. Overdosage: If you think you have taken too much of this medicine contact a poison control center or emergency room at once. NOTE: This medicine is only for you. Do not share this medicine with others. What if I miss a dose? Keep appointments for follow-up doses. It is important not to miss your dose. Call your care team if you are unable to keep an appointment. What may interact with this medication? Do not take this medication with any of the following: Live virus vaccines This medication may also interact with the following: Certain antibiotics, such as clarithromycin, telithromycin Certain antivirals for HIV or hepatitis Certain medications for fungal infections, such as itraconazole, ketoconazole, voriconazole Grapefruit juice Nefazodone Supplements, such as St. John's wort This list may not describe all possible interactions. Give your health care provider a list of all the medicines, herbs, non-prescription drugs, or dietary supplements you use. Also tell them if you smoke, drink alcohol, or use illegal drugs. Some items may interact with your medicine. What should  I watch for while using this medication? This medication may make you feel generally unwell. This is not uncommon as chemotherapy can affect healthy cells as well as cancer cells. Report any side effects. Continue your course of treatment even though you feel ill unless your care team tells you to stop. You may need blood work done while you are taking this medication. This medication can cause serious side effects and infusion reactions. To reduce the risk, your care team may give you other medications to take before receiving this one. Be sure to follow the directions from your care team. This medication may increase your risk of getting an infection. Call your care team for advice if you get a fever, chills, sore throat, or other symptoms of a  cold or flu. Do not treat yourself. Try to avoid being around people who are sick. Avoid taking medications that contain aspirin, acetaminophen, ibuprofen, naproxen, or ketoprofen unless instructed by your care team. These medications may hide a fever. Be careful brushing or flossing your teeth or using a toothpick because you may get an infection or bleed more easily. If you have any dental work done, tell your dentist you are receiving this medication. Some products may contain alcohol. Ask your care team if this medication contains alcohol. Be sure to tell all care teams you are taking this medicine. Certain medications, like metronidazole and disulfiram, can cause an unpleasant reaction when taken with alcohol. The reaction includes flushing, headache, nausea, vomiting, sweating, and increased thirst. The reaction can last from 30 minutes to several hours. This medication may affect your coordination, reaction time, or judgement. Do not drive or operate machinery until you know how this medication affects you. Sit up or stand slowly to reduce the risk of dizzy or fainting spells. Drinking alcohol with this medication can increase the risk of these side effects. Talk to your care team about your risk of cancer. You may be more at risk for certain types of cancer if you take this medication. Talk to your care team if you wish to become pregnant or think you might be pregnant. This medication can cause serious birth defects if taken during pregnancy or if you get pregnant within 2 months after stopping therapy. A negative pregnancy test is required before starting this medication. A reliable form of contraception is recommended while taking this medication and for 2 months after stopping it. Talk to your care team about reliable forms of contraception. Do not breast-feed while taking this medication and for 1 week after stopping therapy. Use a condom during sex and for 4 months after stopping therapy. Tell  your care team right away if you think your partner might be pregnant. This medication can cause serious birth defects. This medication may cause infertility. Talk to your care team if you are concerned about your fertility. What side effects may I notice from receiving this medication? Side effects that you should report to your care team as soon as possible: Allergic reactions--skin rash, itching, hives, swelling of the face, lips, tongue, or throat Change in vision such as blurry vision, seeing halos around lights, vision loss Infection--fever, chills, cough, or sore throat Infusion reactions--chest pain, shortness of breath or trouble breathing, feeling faint or lightheaded Low red blood cell level--unusual weakness or fatigue, dizziness, headache, trouble breathing Pain, tingling, or numbness in the hands or feet Painful swelling, warmth, or redness of the skin, blisters or sores at the infusion site Redness, blistering, peeling, or loosening of the  skin, including inside the mouth Sudden or severe stomach pain, bloody diarrhea, fever, nausea, vomiting Swelling of the ankles, hands, or feet Tumor lysis syndrome (TLS)--nausea, vomiting, diarrhea, decrease in the amount of urine, dark urine, unusual weakness or fatigue, confusion, muscle pain or cramps, fast or irregular heartbeat, joint pain Unusual bruising or bleeding Side effects that usually do not require medical attention (report to your care team if they continue or are bothersome): Change in nail shape, thickness, or color Change in taste Hair loss Increased tears This list may not describe all possible side effects. Call your doctor for medical advice about side effects. You may report side effects to FDA at 1-800-FDA-1088. Where should I keep my medication? This medication is given in a hospital or clinic. It will not be stored at home. NOTE: This sheet is a summary. It may not cover all possible information. If you have  questions about this medicine, talk to your doctor, pharmacist, or health care provider.  2023 Elsevier/Gold Standard (2021-10-29 00:00:00)

## 2022-05-11 NOTE — Progress Notes (Signed)
1335- Pt called me - weird feeling in lower chest "like a heartbeat"   Vs stable 117/82 sat 92-94. Pulse equal and regular at 77-80.  Stated its since I walked to the bathroom. Cisplatin stopped.   Alease Medina CRNP made aware.  Told to hold med for 10 min and if better restart.  1345- pt feels better- med restarted

## 2022-05-12 ENCOUNTER — Other Ambulatory Visit: Payer: Self-pay | Admitting: Nurse Practitioner

## 2022-05-12 ENCOUNTER — Other Ambulatory Visit: Payer: Self-pay | Admitting: *Deleted

## 2022-05-12 DIAGNOSIS — F411 Generalized anxiety disorder: Secondary | ICD-10-CM

## 2022-05-12 MED ORDER — ANASTROZOLE 1 MG PO TABS: 1.0000 mg | ORAL_TABLET | Freq: Every day | ORAL | 0 refills | Status: DC

## 2022-05-13 ENCOUNTER — Encounter: Payer: Self-pay | Admitting: Oncology

## 2022-05-13 ENCOUNTER — Inpatient Hospital Stay: Payer: Medicare HMO

## 2022-05-13 NOTE — Telephone Encounter (Signed)
Med sent, no refills, can request addition refills at her next office visit with Lauren on 9/8

## 2022-05-17 ENCOUNTER — Inpatient Hospital Stay: Payer: Medicare HMO | Attending: Oncology

## 2022-05-17 ENCOUNTER — Other Ambulatory Visit: Payer: Self-pay

## 2022-05-17 ENCOUNTER — Other Ambulatory Visit: Payer: Medicare HMO

## 2022-05-17 ENCOUNTER — Inpatient Hospital Stay (HOSPITAL_BASED_OUTPATIENT_CLINIC_OR_DEPARTMENT_OTHER): Payer: Medicare HMO | Admitting: Hospice and Palliative Medicine

## 2022-05-17 ENCOUNTER — Encounter: Payer: Self-pay | Admitting: Hospice and Palliative Medicine

## 2022-05-17 ENCOUNTER — Inpatient Hospital Stay: Payer: Medicare HMO

## 2022-05-17 DIAGNOSIS — K219 Gastro-esophageal reflux disease without esophagitis: Secondary | ICD-10-CM | POA: Insufficient documentation

## 2022-05-17 DIAGNOSIS — Z5111 Encounter for antineoplastic chemotherapy: Secondary | ICD-10-CM | POA: Diagnosis not present

## 2022-05-17 DIAGNOSIS — R197 Diarrhea, unspecified: Secondary | ICD-10-CM

## 2022-05-17 DIAGNOSIS — G893 Neoplasm related pain (acute) (chronic): Secondary | ICD-10-CM | POA: Insufficient documentation

## 2022-05-17 DIAGNOSIS — D0511 Intraductal carcinoma in situ of right breast: Secondary | ICD-10-CM | POA: Diagnosis not present

## 2022-05-17 DIAGNOSIS — R110 Nausea: Secondary | ICD-10-CM | POA: Diagnosis not present

## 2022-05-17 DIAGNOSIS — Z79811 Long term (current) use of aromatase inhibitors: Secondary | ICD-10-CM | POA: Insufficient documentation

## 2022-05-17 DIAGNOSIS — T451X5A Adverse effect of antineoplastic and immunosuppressive drugs, initial encounter: Secondary | ICD-10-CM | POA: Insufficient documentation

## 2022-05-17 DIAGNOSIS — M791 Myalgia, unspecified site: Secondary | ICD-10-CM | POA: Insufficient documentation

## 2022-05-17 DIAGNOSIS — C3491 Malignant neoplasm of unspecified part of right bronchus or lung: Secondary | ICD-10-CM

## 2022-05-17 DIAGNOSIS — M8580 Other specified disorders of bone density and structure, unspecified site: Secondary | ICD-10-CM | POA: Insufficient documentation

## 2022-05-17 DIAGNOSIS — Z79899 Other long term (current) drug therapy: Secondary | ICD-10-CM | POA: Insufficient documentation

## 2022-05-17 DIAGNOSIS — C3411 Malignant neoplasm of upper lobe, right bronchus or lung: Secondary | ICD-10-CM | POA: Insufficient documentation

## 2022-05-17 DIAGNOSIS — Z923 Personal history of irradiation: Secondary | ICD-10-CM | POA: Insufficient documentation

## 2022-05-17 LAB — CBC WITH DIFFERENTIAL/PLATELET
Abs Immature Granulocytes: 0.02 10*3/uL (ref 0.00–0.07)
Basophils Absolute: 0.1 10*3/uL (ref 0.0–0.1)
Basophils Relative: 2 %
Eosinophils Absolute: 0.1 10*3/uL (ref 0.0–0.5)
Eosinophils Relative: 2 %
HCT: 37 % (ref 36.0–46.0)
Hemoglobin: 11.7 g/dL — ABNORMAL LOW (ref 12.0–15.0)
Immature Granulocytes: 1 %
Lymphocytes Relative: 34 %
Lymphs Abs: 1.1 10*3/uL (ref 0.7–4.0)
MCH: 28.5 pg (ref 26.0–34.0)
MCHC: 31.6 g/dL (ref 30.0–36.0)
MCV: 90 fL (ref 80.0–100.0)
Monocytes Absolute: 0.1 10*3/uL (ref 0.1–1.0)
Monocytes Relative: 3 %
Neutro Abs: 1.9 10*3/uL (ref 1.7–7.7)
Neutrophils Relative %: 58 %
Platelets: 163 10*3/uL (ref 150–400)
RBC: 4.11 MIL/uL (ref 3.87–5.11)
RDW: 15.4 % (ref 11.5–15.5)
Smear Review: NORMAL
WBC: 3.2 10*3/uL — ABNORMAL LOW (ref 4.0–10.5)
nRBC: 0 % (ref 0.0–0.2)

## 2022-05-17 LAB — COMPREHENSIVE METABOLIC PANEL
ALT: 14 U/L (ref 0–44)
AST: 22 U/L (ref 15–41)
Albumin: 3.8 g/dL (ref 3.5–5.0)
Alkaline Phosphatase: 69 U/L (ref 38–126)
Anion gap: 11 (ref 5–15)
BUN: 19 mg/dL (ref 8–23)
CO2: 24 mmol/L (ref 22–32)
Calcium: 8.9 mg/dL (ref 8.9–10.3)
Chloride: 100 mmol/L (ref 98–111)
Creatinine, Ser: 0.88 mg/dL (ref 0.44–1.00)
GFR, Estimated: >60 mL/min (ref >60)
Glucose, Bld: 129 mg/dL — ABNORMAL HIGH (ref 70–99)
Potassium: 3.9 mmol/L (ref 3.5–5.1)
Sodium: 135 mmol/L (ref 135–145)
Total Bilirubin: 0.4 mg/dL (ref 0.3–1.2)
Total Protein: 6.9 g/dL (ref 6.5–8.1)

## 2022-05-17 LAB — MAGNESIUM: Magnesium: 1.5 mg/dL — ABNORMAL LOW (ref 1.7–2.4)

## 2022-05-17 MED ORDER — SODIUM CHLORIDE 0.9 % IV SOLN: 8.0000 mg | Freq: Once | INTRAVENOUS | Status: DC

## 2022-05-17 MED ORDER — ONDANSETRON HCL 4 MG/2ML IJ SOLN
8.0000 mg | Freq: Once | INTRAMUSCULAR | Status: AC
  Administered 2022-05-17: 8 mg via INTRAVENOUS
  Filled 2022-05-17: qty 4

## 2022-05-17 MED ORDER — SODIUM CHLORIDE 0.9 % IV SOLN
INTRAVENOUS | Status: AC
  Filled 2022-05-17 (×2): qty 250

## 2022-05-17 MED ORDER — MAGNESIUM SULFATE 2 GM/50ML IV SOLN
2.0000 g | Freq: Once | INTRAVENOUS | Status: AC
  Administered 2022-05-17: 2 g via INTRAVENOUS
  Filled 2022-05-17: qty 50

## 2022-05-17 MED ORDER — TRAMADOL HCL 50 MG PO TABS: 50.0000 mg | ORAL_TABLET | Freq: Three times a day (TID) | ORAL | 0 refills | Status: AC | PRN

## 2022-05-17 NOTE — Progress Notes (Signed)
Symptom Management [place] at [facility] [phone] [phone]  Patient Care Team: Lloyd Huger, MD as PCP - General (Oncology) Telford Nab, RN as Oncology Nurse Navigator Grayland Ormond, Kathlene November, MD as Consulting Physician (Oncology)   NAME OF PATIENT: Michelle Moore  [id number]  [birth year]   DATE OF VISIT: 05/17/22  REASON FOR CONSULT: Michelle Moore is a 66 y.o. 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  Patient is status post right upper lobectomy on March 17, 2022.  She had 1 of 33 lymph nodes positive for disease.  Plan is for adjuvant chemotherapy.  INTERVAL HISTORY: Patient presents with feelings of general malaise, body aches, and diarrhea.  Patient denies fever or chills.  She reports occasional loose stools primarily at night but only generally 1 episode in 24 hours.  She has had some nausea but no vomiting.  Patient received cycle 2 cisplatin docetaxel chemotherapy on 05/11/2022.  Denies any neurologic complaints. Denies recent fevers or illnesses. Denies any easy bleeding or bruising. Reports fair appetite and denies weight loss. Denies chest pain.  Denies urinary complaints. Patient offers no further specific complaints today.   PAST MEDICAL HISTORY: Past Medical History:  Diagnosis Date   Adenoma of right adrenal gland 12/07/2021   a.) CT chest 12/07/2021; measured 2.3 cm; not FDG avid on 01/06/2022 PET CT.   Anxiety    a.) uses BZO (alprazolam) PRN   Aortic atherosclerosis (HCC)    Arthritis    Bladder mass 12/07/2021   a.) CT abd 12/07/2021 --> 9 x 8 mm nodular enhancing focus at the base of the bladder; suspicious for urothelial neoplasm.   COPD (chronic obstructive pulmonary disease) (HCC)    Coronary artery disease 10/16/2009   a.) LHC/PCI 10/16/2009: EF 60%; 50% OM1, 99% dRCA (3.0 x 18 mm Xience  V DES). b.) LHC 07/31/2014: EF 65%; 40% pLAD, 20% pLCx, 40% OM1, 30% oRCA, 50% pRCA, 20% mRCA, 10% ISR stent to dRCA; further intervention deferred opting for med. mgmt.   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 b.) s/p lumpectomy, adjuvant XRT; currently on extended AI (anastrozole) therapy   Essential hypertension    GERD (gastroesophageal reflux disease)    HLD (hyperlipidemia)    Left thyroid nodule 12/07/2021   a.) CT chest; measured 2.6 cm   Long term current use of immunosuppressive drug    a.) MTX for RA.   OSA on CPAP    Osteopenia    Personal history of radiation therapy    Pneumonia    PRES (posterior reversible encephalopathy syndrome) 07/14/2014   Pulmonary nodules 11/08/2021   a.) CXR 11/08/2021: 80m nodular density RUL. b.) CT chest 12/07/2021: 1.9 x 1.4 x 2.5 spiculated lesion anterior RUL. c.) PET CT 01/06/2022: 2.2 x 1.9 spiculated anterior RUL mass (SUV max 16.4); d.) Bx (+) NSCLC, favoring SCC with tumor necrosis; p40/CK7/GATA3 (+), TTF-1 (-)   Rheumatoid arthritis ([facility]    a.) on MTX   Seasonal allergies    Seizures ([facility] 2014   a.) x 1 episode; etiology never determined.   Sinus headache    Squamous cell lung cancer, right ([facility] 03/17/2022   a.) RUL lobectomy 03/17/2022 --> Bx (+) for invasive moderately differentiated  SCC of the RIGHT upper lobe (PDL-1: 5%) --> stage IIIA SCC (pT3, pN1, cM0)   ST elevation myocardial infarction (STEMI) of inferior wall ([place]) 10/16/2009   a.) LHC/PCI 10/16/2009: EF 60%; 50% OM1, 99% dRCA (3.0 x 18 mm Xience V DES).   Tubular adenoma    Valvular heart disease     PAST SURGICAL HISTORY:  Past Surgical History:  Procedure Laterality Date   ABDOMINAL HYSTERECTOMY     ANTERIOR CERVICAL DECOMP/DISCECTOMY FUSION N/A 01/20/2014   Procedure: ANTERIOR CERVICAL DECOMPRESSION/DISCECTOMY FUSION 2 LEVELS  cervical five/six six Tarry Kos;  Surgeon: Ophelia Charter, MD;  Location: [place] [facility];  Service: Neurosurgery;  Laterality: N/A;   APPENDECTOMY     BACK SURGERY     lumbar   BLADDER INSTILLATION N/A 05/02/2022   Procedure: BLADDER INSTILLATION OF GEMCITABINE;  Surgeon: Hollice Espy, MD;  Location: [facility];  Service: Urology;  Laterality: N/A;   BREAST BIOPSY Right 02/01/2018   x shape, DCIS    BREAST BIOPSY  01/16/2019   coil, BENIGN MAMMARY TISSUE WITH MODERATE STROMAL FIBROSIS AND COARSE DYSTROPHIC CALCIFICATIONS of the RIGHT breast   BREAST LUMPECTOMY Right 03/07/2018   Procedure: BREAST LUMPECTOMY/ RE EXCISION;  Surgeon: Herbert Pun, MD;  Location: [place] [facility];  Service: General;  Laterality: Right;   CARDIAC CATHETERIZATION Left 07/31/2014   Procedure: CARDIAC CATHETERIZATION; Location: [facility]; Surgeon: Serafina Royals, MD   COLONOSCOPY WITH PROPOFOL N/A 02/11/2021   Procedure: COLONOSCOPY WITH PROPOFOL;  Surgeon: Lucilla Lame, MD;  Location: [place];  Service: Endoscopy;  Laterality: N/A;  Requests Early   CORONARY ANGIOPLASTY WITH STENT PLACEMENT Left 10/16/2009   Procedure: CORONARY ANGIOPLASTY WITH STENT PLACEMENT; Location: [place]; Surgeon: Katrine Coho, MD   CYSTOSCOPY W/ RETROGRADES Bilateral 05/02/2022   Procedure: CYSTOSCOPY WITH RETROGRADE PYELOGRAM;  Surgeon: Hollice Espy, MD;  Location: [facility];  Service: Urology;  Laterality: Bilateral;   INTERCOSTAL NERVE BLOCK Right 03/17/2022   Procedure: INTERCOSTAL NERVE BLOCK;  Surgeon: Melrose Nakayama, MD;  Location: [place];  Service: Thoracic;  Laterality: Right;   LASIK Bilateral    LUNG LOBECTOMY Right 03/17/2022   right upper lobe   LYMPH NODE DISSECTION Right 03/17/2022   Procedure: LYMPH NODE DISSECTION;  Surgeon: Melrose Nakayama, MD;  Location: [place];  Service: Thoracic;  Laterality: Right;   PARTIAL MASTECTOMY WITH NEEDLE LOCALIZATION Right 02/21/2018   Procedure: PARTIAL MASTECTOMY WITH  NEEDLE LOCALIZATION;  Surgeon: Herbert Pun, MD;  Location: [facility];  Service: General;  Laterality: Right;   POLYPECTOMY N/A 02/11/2021   Procedure: POLYPECTOMY;  Surgeon: Lucilla Lame, MD;  Location: [place];  Service: Endoscopy;  Laterality: N/A;   ROTATOR CUFF REPAIR Left    SHOULDER ARTHROSCOPY WITH ROTATOR CUFF REPAIR AND OPEN BICEPS TENODESIS Right 08/07/2019   Procedure: SHOULDER ARTHROSCOPY WITH DEBRIDEMENT, DECOMPRESSION, ROTATOR CUFF REPAIR AND BICEPS TENODESIS. - RNFA;  Surgeon: Corky Mull, MD;  Location: [facility];  Service: Orthopedics;  Laterality: Right;   TOE SURGERY Right    has pin and plate in it   TRANSURETHRAL RESECTION OF BLADDER TUMOR N/A 05/02/2022   Procedure: TRANSURETHRAL RESECTION OF BLADDER TUMOR (TURBT);  Surgeon: Hollice Espy, MD;  Location: [facility];  Service: Urology;  Laterality: N/A;   VIDEO BRONCHOSCOPY WITH ENDOBRONCHIAL ULTRASOUND N/A 03/02/2022   Procedure: VIDEO BRONCHOSCOPY WITH ENDOBRONCHIAL ULTRASOUND;  Surgeon: Tyler Pita, MD;  Location: [facility];  Service: Cardiopulmonary;  Laterality: N/A;    HEMATOLOGY/ONCOLOGY HISTORY:  Oncology History  Ductal carcinoma in situ (  DCIS) of right breast  03/18/2018 Initial Diagnosis   Ductal carcinoma in situ (DCIS) of right breast   03/18/2018 Cancer Staging   Staging form: Breast, AJCC 8th Edition - Clinical: Stage 0 (cTis (DCIS), cN0, cM0, ER+, PR-, HER2-) - Signed by Lloyd Huger, MD on 03/18/2018   Squamous cell carcinoma of right lung ([facility])  02/15/2022 Initial Diagnosis   Squamous cell carcinoma of right lung ([place])   04/07/2022 Cancer Staging   Staging form: Lung, AJCC 8th Edition - Pathologic stage from 04/07/2022: Stage IIIA (pT3, pN1, cM0) - Signed by Lloyd Huger, MD on 04/07/2022   04/20/2022 - 04/22/2022 Chemotherapy   Patient is on Treatment Plan : LUNG Cisplatin + Docetaxel q21d     04/20/2022 -  Chemotherapy   Patient is on Treatment Plan : LUNG Cisplatin  + Docetaxel q21d       ALLERGIES:  is allergic to other.  MEDICATIONS:  Current Outpatient Medications  Medication Sig Dispense Refill   albuterol (VENTOLIN HFA) 108 (90 Base) MCG/ACT inhaler Inhale 1-2 puffs into the lungs every 6 (six) hours as needed for wheezing or shortness of breath. 1 g 1   alendronate (FOSAMAX) 70 MG tablet Take 1 tablet (70 mg total) by mouth once a week. Take with a full glass of water on an empty stomach. Stand or sit upright for 1 hour after taking (Patient taking differently: Take 70 mg by mouth once a week. Take with a full glass of water on an empty stomach. Stand or sit upright for 1 hour after taking WEDNESDAY) 12 tablet 1   ALPRAZolam (XANAX) 0.25 MG tablet Take 1 tablet (0.25 mg total) by mouth at bedtime as needed for anxiety or sleep. 30 tablet 0   anastrozole (ARIMIDEX) 1 MG tablet Take 1 tablet (1 mg total) by mouth at bedtime. 30 tablet 0   aspirin-acetaminophen-caffeine (EXCEDRIN MIGRAINE) 546-270-35 MG tablet Take by mouth every 6 (six) hours as needed for headache.     fluticasone (FLONASE) 50 MCG/ACT nasal spray Place 2 sprays into both nostrils daily as needed for allergies. 60 mL 1   fluticasone-salmeterol (ADVAIR) 250-50 MCG/ACT AEPB Inhale 1 puff into the lungs in the morning and at bedtime. 60 each 5   folic acid (FOLVITE) 1 MG tablet Take 2 mg by mouth in the morning.     gabapentin (NEURONTIN) 300 MG capsule Take 1 capsule (300 mg total) by mouth 2 (two) times daily. 60 capsule 6   HYDROcodone-acetaminophen (NORCO/VICODIN) 5-325 MG tablet Take 1-2 tablets by mouth every 6 (six) hours as needed for moderate pain. 10 tablet 0   methotrexate (RHEUMATREX) 2.5 MG tablet Take 20 mg by mouth once a week. WEDNESDAY     montelukast (SINGULAIR) 10 MG tablet Take 1 tablet (10 mg total) by mouth at bedtime. 90 tablet 1   omeprazole (PRILOSEC) 40 MG capsule Take 1 capsule (40 mg total) by mouth daily. 30 capsule 3   oxybutynin (DITROPAN) 5 MG tablet Take  1 tablet (5 mg total) by mouth every 8 (eight) hours as needed for bladder spasms. 30 tablet 0   potassium chloride (KLOR-CON) 10 MEQ tablet TAKE (1) TABLET BY MOUTH EVERY DAY 90 tablet 1   rosuvastatin (CRESTOR) 40 MG tablet Take 40 mg by mouth every evening.     sertraline (ZOLOFT) 100 MG tablet Take 1 tablet (100 mg total) by mouth daily. 30 tablet 3   traMADol (ULTRAM) 50 MG tablet TAKE (1) TABLET BY MOUTH THREE TIMES  A DAY AS NEEDED     No current facility-administered medications for this visit.    VITAL SIGNS: There were no vitals taken for this visit. There were no vitals filed for this visit.  Estimated body mass index is 27.34 kg/m as calculated from the following:   Height as of 05/03/22: 5' (1.524 m).   Weight as of 05/10/22: 140 lb (63.5 kg).  LABS: CBC:    Component Value Date/Time   WBC 7.0 05/10/2022 0848   HGB 11.4 (L) 05/10/2022 0848   HGB 14.8 06/09/2021 1125   HCT 36.6 05/10/2022 0848   HCT 44.2 06/09/2021 1125   PLT 220 05/10/2022 0848   PLT 255 06/09/2021 1125   MCV 91.7 05/10/2022 0848   MCV 93 06/09/2021 1125   NEUTROABS 4.4 05/10/2022 0848   NEUTROABS 6.1 06/09/2021 1125   LYMPHSABS 1.8 05/10/2022 0848   LYMPHSABS 2.1 06/09/2021 1125   MONOABS 0.7 05/10/2022 0848   EOSABS 0.0 05/10/2022 0848   EOSABS 0.2 06/09/2021 1125   BASOSABS 0.1 05/10/2022 0848   BASOSABS 0.1 06/09/2021 1125   Comprehensive Metabolic Panel:    Component Value Date/Time   NA 138 05/10/2022 0848   NA 137 06/09/2021 1125   K 4.1 05/10/2022 0848   CL 105 05/10/2022 0848   CO2 27 05/10/2022 0848   BUN 13 05/10/2022 0848   BUN 8 06/09/2021 1125   CREATININE 0.69 05/10/2022 0848   GLUCOSE 114 (H) 05/10/2022 0848   CALCIUM 8.8 (L) 05/10/2022 0848   AST 16 05/10/2022 0848   ALT 11 05/10/2022 0848   ALKPHOS 97 05/10/2022 0848   BILITOT 0.2 (L) 05/10/2022 0848   BILITOT 0.3 06/09/2021 1125   PROT 6.8 05/10/2022 0848   PROT 6.5 06/09/2021 1125   ALBUMIN 3.7 05/10/2022 0848    ALBUMIN 4.2 06/09/2021 1125    RADIOGRAPHIC STUDIES: DG Chest 2 View  Result Date: 05/03/2022 CLINICAL DATA:  Lung cancer EXAM: CHEST - 2 VIEW COMPARISON:  Chest 04/05/2022 FINDINGS: Postop right upper lobectomy changes stable. No new area of infiltrate or collapse. No effusion. Improved aeration in the right middle lobe which is now clear IMPRESSION: Improved aeration right lung base. Postop changes right middle lobe stable Electronically Signed   By: Franchot Gallo M.D.   On: 05/03/2022 09:29   DG OR UROLOGY CYSTO IMAGE ([facility] ONLY)  Result Date: 05/02/2022 There is no interpretation for this exam.  This order is for images obtained during a surgical procedure.  Please See "Surgeries" Tab for more information regarding the procedure.   MR Brain W Wo Contrast  Result Date: 04/21/2022 CLINICAL DATA:  Squamous cell carcinoma of right lung. Metastatic disease evaluation. EXAM: MRI HEAD WITHOUT AND WITH CONTRAST TECHNIQUE: Multiplanar, multiecho pulse sequences of the brain and surrounding structures were obtained without and with intravenous contrast. CONTRAST:  67m GADAVIST GADOBUTROL 1 MMOL/ML IV SOLN COMPARISON:  No pertinent prior exams available for comparison. FINDINGS: Brain: Mild generalized parenchymal atrophy. Prominent perivascular spaces within the left deep gray nuclei. No cortical encephalomalacia is identified. No significant cerebral white matter disease for age. There is no acute infarct. No evidence of an intracranial mass. No chronic intracranial blood products. No extra-axial fluid collection. No midline shift. No pathologic intracranial enhancement identified. Vascular: Maintained flow voids within the proximal large arterial vessels. Skull and upper cervical spine: No focal suspicious marrow lesion. Sinuses/Orbits: No mass or acute finding within the imaged orbits. No significant paranasal sinus disease. IMPRESSION: No evidence of intracranial metastatic  disease. Electronically  Signed   By: Kellie Simmering D.O.   On: 04/21/2022 09:42    PERFORMANCE STATUS (ECOG) : 1 - Symptomatic but completely ambulatory  Review of Systems Unless otherwise noted, a complete review of systems is negative.  Physical Exam General: NAD Cardiovascular: regular rate and rhythm Pulmonary: clear ant fields Abdomen: soft, nontender, + bowel sounds GU: no suprapubic tenderness Extremities: no edema, no joint deformities Skin: no rashes Neurological: Weakness but otherwise nonfocal  IMPRESSION/PLAN: Nausea/diarrhea-symptoms most likely secondary to chemotherapy.  Labs grossly unchanged from baseline.  Patient is nontoxic with benign exam.  Proceed with supportive care with IV fluids.  Replete mag.  Discussed use of antiemetics and antidiarrheals as needed.  Can consider scheduled [facility] visit 1 week following chemotherapy if needed.  Pain -patient has chronic pain and is followed by physiatry at [facility].  She is on tramadol, which she generally takes 100 mg twice daily.  However, she is out of that currently.  She has Norco prescribed following her recent surgery but does not like the way that makes her feel.  We will refill tramadol until patient can see her physiatrist in 2 weeks.[place]  PDMP reviewed.   Patient expressed understanding and was in agreement with this plan. She also understands that She can call clinic at any time with any questions, concerns, or complaints.   Thank you for allowing me to participate in the care of this very pleasant patient.   Time Total: 20 minutes  Visit consisted of counseling and education dealing with the complex and emotionally intense issues of symptom management in the setting of serious illness.Greater than 50%  of this time was spent counseling and coordinating care related to the above assessment and plan.  Signed by: Altha Harm, PhD, NP-C

## 2022-05-17 NOTE — Progress Notes (Signed)
Pt to see [facility] provider.  Reports being nauseous without vomiting since Friday as well as diarrhea one episode daily since Friday.  Pt reports having pain in lower back and legs bilaterally. Pt states "I just dont feel well".  Pt states took medication for nausea yesterday with some relief but unsure of what she took.  Pt has discontinued compazine on med list.

## 2022-05-18 ENCOUNTER — Inpatient Hospital Stay: Payer: Medicare HMO

## 2022-05-19 ENCOUNTER — Other Ambulatory Visit: Payer: Self-pay | Admitting: Physician Assistant

## 2022-05-20 ENCOUNTER — Encounter: Payer: Self-pay | Admitting: Physician Assistant

## 2022-05-20 ENCOUNTER — Ambulatory Visit (INDEPENDENT_AMBULATORY_CARE_PROVIDER_SITE_OTHER): Payer: Medicare HMO | Admitting: Physician Assistant

## 2022-05-20 DIAGNOSIS — F320 Major depressive disorder, single episode, mild: Secondary | ICD-10-CM

## 2022-05-20 DIAGNOSIS — Z9889 Other specified postprocedural states: Secondary | ICD-10-CM

## 2022-05-20 DIAGNOSIS — F411 Generalized anxiety disorder: Secondary | ICD-10-CM | POA: Diagnosis not present

## 2022-05-20 DIAGNOSIS — Z8603 Personal history of neoplasm of uncertain behavior: Secondary | ICD-10-CM | POA: Diagnosis not present

## 2022-05-20 DIAGNOSIS — C3491 Malignant neoplasm of unspecified part of right bronchus or lung: Secondary | ICD-10-CM

## 2022-05-20 NOTE — Progress Notes (Signed)
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 -Did have bladder surgery to remove mass, states it had been shrunk by the chemo and was removed and urology is watching it. -feesl tired and worn out from the treatments -Has been sleeping better the past few nights. Pain under better control. -been taking $RemoveBefo'40mg'[id number]$  omeprazole and sometimes gets reflux still and may take BID if needed for now, but to avoid triggering foods and stay upright after eating -Had tried breztri and hoarseness begain but this was also after intubation and may be due to that instead. She was swtiched back to advair.  Hoarseness still present. May be due to gerd as well -She is still anxious due to recent health changes, but is managing and is doing well on increased dose of zoloft  Current Medication: Outpatient Encounter Medications as of 05/20/2022  Medication Sig   albuterol (VENTOLIN HFA) 108 (90 Base) MCG/ACT inhaler Inhale 1-2 puffs into the lungs every 6 (six) hours as needed for wheezing or shortness of breath.   alendronate (FOSAMAX) 70 MG tablet Take 1 tablet (70 mg total) by mouth once a week. Take with a full glass of water on an empty stomach. Stand or sit upright for 1 hour after taking (Patient taking differently: Take 70 mg by mouth once a week. Take with a full glass of water on an empty stomach. Stand or sit upright for 1 hour after taking WEDNESDAY)    ALPRAZolam (XANAX) 0.25 MG tablet Take 1 tablet (0.25 mg total) by mouth at bedtime as needed for anxiety or sleep.   anastrozole (ARIMIDEX) 1 MG tablet Take 1 tablet (1 mg total) by mouth at bedtime.   aspirin-acetaminophen-caffeine (EXCEDRIN MIGRAINE) 250-250-65 MG tablet Take by mouth every 6 (six) hours as needed for headache.   fluticasone (FLONASE) 50 MCG/ACT nasal spray Place 2 sprays into both nostrils daily as needed for allergies.   fluticasone-salmeterol (ADVAIR) 250-50 MCG/ACT AEPB Inhale 1 puff into the lungs in the morning and at bedtime.   folic acid (FOLVITE) 1 MG tablet Take 2 mg by mouth in the morning.   gabapentin (NEURONTIN) 300 MG capsule Take 1 capsule (300 mg total) by mouth 2 (two) times daily.   HYDROcodone-acetaminophen (NORCO/VICODIN) 5-325 MG tablet Take 1-2 tablets by mouth every 6 (six) hours as needed for moderate pain.   methotrexate (RHEUMATREX) 2.5 MG tablet Take 20 mg by mouth once a week. WEDNESDAY   montelukast (SINGULAIR) 10 MG tablet Take 1 tablet (10 mg total) by mouth at bedtime.   omeprazole (PRILOSEC) 40 MG capsule Take 1 capsule (40 mg total) by mouth daily.   oxybutynin (DITROPAN) 5 MG tablet Take 1 tablet (5 mg total) by mouth every 8 (eight) hours as needed for bladder spasms.   potassium chloride (KLOR-CON) 10 MEQ tablet TAKE (1) TABLET BY MOUTH EVERY DAY   rosuvastatin (CRESTOR) 40 MG tablet Take 40 mg by  mouth every evening.   sertraline (ZOLOFT) 100 MG tablet TAKE (1) TABLET BY MOUTH EVERY DAY   traMADol (ULTRAM) 50 MG tablet TAKE (1) TABLET BY MOUTH THREE TIMES A DAY AS NEEDED   traMADol (ULTRAM) 50 MG tablet Take 1-2 tablets (50-100 mg total) by mouth 3 (three) times daily as needed.   [DISCONTINUED] prochlorperazine (COMPAZINE) 10 MG tablet Take 1 tablet (10 mg total) by mouth every 6 (six) hours as needed (Nausea or vomiting). (Patient not taking: Reported on 04/26/2022)   No facility-administered encounter medications on file as of 05/20/2022.     Surgical History: Past Surgical History:  Procedure Laterality Date   ABDOMINAL HYSTERECTOMY     ANTERIOR CERVICAL DECOMP/DISCECTOMY FUSION N/A 01/20/2014   Procedure: ANTERIOR CERVICAL DECOMPRESSION/DISCECTOMY FUSION 2 LEVELS cervical five/six six Tarry Kos;  Surgeon: Ophelia Charter, MD;  Location: [facility];  Service: Neurosurgery;  Laterality: N/A;   APPENDECTOMY     BACK SURGERY     lumbar   BLADDER INSTILLATION N/A 05/02/2022   Procedure: BLADDER INSTILLATION OF GEMCITABINE;  Surgeon: Hollice Espy, MD;  Location: [facility];  Service: Urology;  Laterality: N/A;   BREAST BIOPSY Right 02/01/2018   x shape, DCIS    BREAST BIOPSY  01/16/2019   coil, BENIGN MAMMARY TISSUE WITH MODERATE STROMAL FIBROSIS AND COARSE DYSTROPHIC CALCIFICATIONS of the RIGHT breast   BREAST LUMPECTOMY Right 03/07/2018   Procedure: BREAST LUMPECTOMY/ RE EXCISION;  Surgeon: Herbert Pun, MD;  Location: [facility];  Service: General;  Laterality: Right;   CARDIAC CATHETERIZATION Left 07/31/2014   Procedure: CARDIAC CATHETERIZATION; Location: [facility]; Surgeon: Serafina Royals, MD   COLONOSCOPY WITH PROPOFOL N/A 02/11/2021   Procedure: COLONOSCOPY WITH PROPOFOL;  Surgeon: Lucilla Lame, MD;  Location: [facility];  Service: Endoscopy;  Laterality: N/A;  Requests Early   CORONARY ANGIOPLASTY WITH STENT PLACEMENT Left 10/16/2009   Procedure: CORONARY ANGIOPLASTY WITH STENT PLACEMENT; Location: [facility]; Surgeon: Katrine Coho, MD   CYSTOSCOPY W/ RETROGRADES Bilateral 05/02/2022   Procedure: CYSTOSCOPY WITH RETROGRADE PYELOGRAM;  Surgeon: Hollice Espy, MD;  Location: [facility];  Service: Urology;  Laterality: Bilateral;   INTERCOSTAL NERVE BLOCK Right 03/17/2022   Procedure: INTERCOSTAL NERVE BLOCK;  Surgeon: Melrose Nakayama, MD;  Location: [facility];  Service: Thoracic;  Laterality: Right;   LASIK Bilateral    LUNG LOBECTOMY Right 03/17/2022   right upper lobe   LYMPH NODE DISSECTION Right  03/17/2022   Procedure: LYMPH NODE DISSECTION;  Surgeon: Melrose Nakayama, MD;  Location: [facility];  Service: Thoracic;  Laterality: Right;   PARTIAL MASTECTOMY WITH NEEDLE LOCALIZATION Right 02/21/2018   Procedure: PARTIAL MASTECTOMY WITH NEEDLE LOCALIZATION;  Surgeon: Herbert Pun, MD;  Location: [facility];  Service: General;  Laterality: Right;   POLYPECTOMY N/A 02/11/2021   Procedure: POLYPECTOMY;  Surgeon: Lucilla Lame, MD;  Location: [facility];  Service: Endoscopy;  Laterality: N/A;   ROTATOR CUFF REPAIR Left    SHOULDER ARTHROSCOPY WITH ROTATOR CUFF REPAIR AND OPEN BICEPS TENODESIS Right 08/07/2019   Procedure: SHOULDER ARTHROSCOPY WITH DEBRIDEMENT, DECOMPRESSION, ROTATOR CUFF REPAIR AND BICEPS TENODESIS. - RNFA;  Surgeon: Corky Mull, MD;  Location: [facility];  Service: Orthopedics;  Laterality: Right;   TOE SURGERY Right    has pin and plate in it   TRANSURETHRAL RESECTION OF BLADDER TUMOR N/A 05/02/2022   Procedure: TRANSURETHRAL RESECTION OF BLADDER TUMOR (TURBT);  Surgeon: Hollice Espy, MD;  Location: [facility];  Service: Urology;  Laterality: N/A;   VIDEO BRONCHOSCOPY WITH ENDOBRONCHIAL  ULTRASOUND N/A 03/02/2022   Procedure: VIDEO BRONCHOSCOPY WITH ENDOBRONCHIAL ULTRASOUND;  Surgeon: Tyler Pita, MD;  Location: [facility];  Service: Cardiopulmonary;  Laterality: N/A;    Medical History: Past Medical History:  Diagnosis Date   Adenoma of right adrenal gland 12/07/2021   a.) CT chest 12/07/2021; measured 2.3 cm; not FDG avid on 01/06/2022 PET CT.   Anxiety    a.) uses BZO (alprazolam) PRN   Aortic atherosclerosis (HCC)    Arthritis    Bladder mass 12/07/2021   a.) CT abd 12/07/2021 --> 9 x 8 mm nodular enhancing focus at the base of the bladder; suspicious for urothelial neoplasm.   COPD (chronic obstructive pulmonary disease) (HCC)    Coronary artery disease 10/16/2009   a.) LHC/PCI 10/16/2009: EF 60%; 50% OM1, 99% dRCA (3.0 x 18 mm Xience V DES).  b.) LHC 07/31/2014: EF 65%; 40% pLAD, 20% pLCx, 40% OM1, 30% oRCA, 50% pRCA, 20% mRCA, 10% ISR stent to dRCA; further intervention deferred opting for med. mgmt.   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 b.) s/p lumpectomy, adjuvant XRT; currently on extended AI (anastrozole) therapy   Essential hypertension    GERD (gastroesophageal reflux disease)    HLD (hyperlipidemia)    Left thyroid nodule 12/07/2021   a.) CT chest; measured 2.6 cm   Long term current use of immunosuppressive drug    a.) MTX for RA.   OSA on CPAP    Osteopenia    Personal history of radiation therapy    Pneumonia    PRES (posterior reversible encephalopathy syndrome) 07/14/2014   Pulmonary nodules 11/08/2021   a.) CXR 11/08/2021: 37mm nodular density RUL. b.) CT chest 12/07/2021: 1.9 x 1.4 x 2.5 spiculated lesion anterior RUL. c.) PET CT 01/06/2022: 2.2 x 1.9 spiculated anterior RUL mass (SUV max 16.4); d.) Bx (+) NSCLC, favoring SCC with tumor necrosis; p40/CK7/GATA3 (+), TTF-1 (-)   Rheumatoid arthritis ([place])    a.) on MTX   Seasonal allergies    Seizures ([place]) 2014   a.) x 1 episode; etiology never determined.   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   Tubular adenoma    Valvular heart disease     Family History: Family History  Problem Relation Age of Onset   Breast cancer Cousin 107       bilateral at 75 and 56; daughter of maternal aunt who was unaffected   Lung cancer Maternal Aunt        dx 47s; deceased 41s; smoker   Heart  attack Father        deceased 29   Stomach cancer Maternal Grandmother     Social History   Socioeconomic History   Marital status: Married    Spouse name: Jimmy   Number of children: 1   Years of education: Not on file   Highest education level: Not on file  Occupational History   Not on file  Tobacco Use   Smoking status: Former  Packs/day: 1.00    Years: 30.00    Total pack years: 30.00    Types: Cigarettes    Quit date: 09/13/2015    Years since quitting: 6.7   Smokeless tobacco: Never  Vaping Use   Vaping Use: Never used  Substance and Sexual Activity   Alcohol use: Yes    Comment: occasional   Drug use: No   Sexual activity: Not on file  Other Topics Concern   Not on file  Social History Narrative   Not on file   Social Determinants of Health   Financial Resource Strain: Low Risk  (03/31/2022)   Overall Financial Resource Strain (CARDIA)    Difficulty of Paying Living Expenses: Not very hard  Food Insecurity: No Food Insecurity (05/23/2022)   Hunger Vital Sign    Worried About Running Out of Food in the Last Year: Never true    Ran Out of Food in the Last Year: Never true  Transportation Needs: No Transportation Needs (05/23/2022)   PRAPARE - Hydrologist (Medical): No    Lack of Transportation (Non-Medical): No  Physical Activity: Inactive (03/31/2022)   Exercise Vital Sign    Days of Exercise per Week: 0 days    Minutes of Exercise per Session: 0 min  Stress: No Stress Concern Present (03/31/2022)   [place]    Feeling of Stress : Only a little  Social Connections: Socially Integrated (03/31/2022)   Social Connection and Isolation Panel [NHANES]    Frequency of Communication with Friends and Family: Three times a week    Frequency of Social Gatherings with Friends and Family: Three times a week    Attends Religious Services: More than 4 times per year     Active Member of Clubs or Organizations: Yes    Attends Archivist Meetings: More than 4 times per year    Marital Status: Married  Human resources officer Violence: Not At Risk (03/31/2022)   Humiliation, Afraid, Rape, and Kick questionnaire    Fear of Current or Ex-Partner: No    Emotionally Abused: No    Physically Abused: No    Sexually Abused: No      Review of Systems  Constitutional:  Negative for chills, fatigue and unexpected weight change.  HENT:  Positive for tinnitus and voice change. Negative for congestion, rhinorrhea, sneezing and sore throat.   Eyes:  Negative for redness.  Respiratory:  Negative for cough, chest tightness and shortness of breath.   Cardiovascular:  Negative for chest pain and palpitations.  Gastrointestinal:  Negative for abdominal pain, constipation, diarrhea, nausea and vomiting.       Reflux  Genitourinary:  Negative for dysuria and frequency.  Musculoskeletal:  Negative for arthralgias, back pain, joint swelling and neck pain.  Skin:  Negative for rash.  Neurological: Negative.  Negative for tremors and numbness.  Hematological:  Negative for adenopathy. Does not bruise/bleed easily.  Psychiatric/Behavioral:  Positive for behavioral problems (Depression). Negative for self-injury, sleep disturbance and suicidal ideas. The patient is nervous/anxious.     Vital Signs: BP 111/64   Pulse 97   Temp 98.2 F (36.8 C)   Resp 16   Ht 5' (1.524 m)   Wt 137 lb 3.2 oz (62.2 kg)   SpO2 96%   BMI 26.80 kg/m    Physical Exam Vitals reviewed.  Constitutional:      General: She is not in acute distress.  Appearance: Normal appearance. She is not ill-appearing.  HENT:     Head: Normocephalic and atraumatic.     Right Ear: Tympanic membrane normal.     Left Ear: Tympanic membrane normal.  Eyes:     Pupils: Pupils are equal, round, and reactive to light.  Cardiovascular:     Rate and Rhythm: Normal rate and regular rhythm.  Pulmonary:      Effort: Pulmonary effort is normal. No respiratory distress.  Musculoskeletal:        General: Normal range of motion.     Cervical back: Normal range of motion.  Skin:    General: Skin is warm and dry.  Neurological:     Mental Status: She is alert and oriented to person, place, and time.  Psychiatric:        Mood and Affect: Mood normal.        Behavior: Behavior normal.        Assessment/Plan: 1. Generalized anxiety disorder Continue current medications  2. Depression, major, single episode, mild (HCC) Continue current medications  3. Squamous cell carcinoma of right lung ([place]) Followed by oncology  4. History of transurethral resection of bladder tumor (TURBT) Followed by urology    General Counseling: Valentina Gu understanding of the findings of todays visit and agrees with plan of treatment. I have discussed any further diagnostic evaluation that may be needed or ordered today. We also reviewed her medications today. she has been encouraged to call the office with any questions or concerns that should arise related to todays visit.    No orders of the defined types were placed in this encounter.   No orders of the defined types were placed in this encounter.   This patient was seen by Drema Dallas, PA-C in collaboration with Dr. Clayborn Bigness as a part of collaborative care agreement.   Total time spent:30 Minutes Time spent includes review of chart, medications, test results, and follow up plan with the patient.      Dr Lavera Guise Internal medicine

## 2022-05-23 ENCOUNTER — Telehealth: Payer: Self-pay

## 2022-05-23 NOTE — Patient Instructions (Signed)
Visit Information  Thank you for taking time to visit with me today. Please don't hesitate to contact me if I can be of assistance to you.   Following are the goals we discussed today:   Goals Addressed             This Visit's Progress    COMPLETED: Care coordination activities - No follow up needed.       Care coordination program/ services discussed  Social determinants of health survey completed Patient advised to contact primary care provider office  if care coordination services needed in the future          If you are experiencing a Mental Health or [place] or need someone to talk to, please call 1-800-273-TALK (toll free, 24 hour hotline)  Patient verbalizes understanding of instructions and care plan provided today and agrees to view in [place]. Active MyChart status and patient understanding of how to access instructions and care plan via MyChart confirmed with patient.     No further follow up required:    Quinn Plowman RN,BSN,CCM [place] [phone] direct line

## 2022-05-23 NOTE — Patient Outreach (Signed)
  Care Coordination   Initial Visit Note   05/23/2022 Name: MAHROSH DONNELL MRN: [id number] DOB: 05/24/1956  LYNITA GROSECLOSE is a 66 y.o. year old female who sees McDonough, Si Gaul, PA-C for primary care. I spoke with  [name] by phone today.  What matters to the patients health and wellness today?  Patient verbalized she had no nursing or community resource needs at this time.     Goals Addressed             This Visit's Progress    COMPLETED: Care coordination activities - No follow up needed.       Care coordination program/ services discussed  Social determinants of health survey completed Patient advised to contact primary care provider office  if care coordination services needed in the future         SDOH assessments and interventions completed:  Yes  SDOH Interventions Today    Flowsheet Row Most Recent Value  SDOH Interventions   Food Insecurity Interventions Intervention Not Indicated  Housing Interventions Intervention Not Indicated  Transportation Interventions Intervention Not Indicated        Care Coordination Interventions Activated:  Yes  Care Coordination Interventions:  Yes, provided   Follow up plan: No further intervention required.   Encounter Outcome:  Pt. Visit Completed   Quinn Plowman RN,BSN,CCM [place] [phone] direct line

## 2022-05-24 DIAGNOSIS — M48062 Spinal stenosis, lumbar region with neurogenic claudication: Secondary | ICD-10-CM | POA: Diagnosis not present

## 2022-05-24 DIAGNOSIS — M5136 Other intervertebral disc degeneration, lumbar region: Secondary | ICD-10-CM | POA: Diagnosis not present

## 2022-05-24 DIAGNOSIS — M5416 Radiculopathy, lumbar region: Secondary | ICD-10-CM | POA: Diagnosis not present

## 2022-05-24 DIAGNOSIS — M0579 Rheumatoid arthritis with rheumatoid factor of multiple sites without organ or systems involvement: Secondary | ICD-10-CM | POA: Diagnosis not present

## 2022-05-24 DIAGNOSIS — C3490 Malignant neoplasm of unspecified part of unspecified bronchus or lung: Secondary | ICD-10-CM | POA: Diagnosis not present

## 2022-05-24 DIAGNOSIS — Z79899 Other long term (current) drug therapy: Secondary | ICD-10-CM | POA: Diagnosis not present

## 2022-05-24 DIAGNOSIS — M65341 Trigger finger, right ring finger: Secondary | ICD-10-CM | POA: Diagnosis not present

## 2022-05-31 ENCOUNTER — Other Ambulatory Visit: Payer: Medicare HMO

## 2022-05-31 ENCOUNTER — Inpatient Hospital Stay: Payer: Medicare HMO

## 2022-05-31 ENCOUNTER — Encounter: Payer: Self-pay | Admitting: Medical Oncology

## 2022-05-31 ENCOUNTER — Ambulatory Visit: Payer: Medicare HMO | Admitting: Oncology

## 2022-05-31 ENCOUNTER — Inpatient Hospital Stay (HOSPITAL_BASED_OUTPATIENT_CLINIC_OR_DEPARTMENT_OTHER): Payer: Medicare HMO | Admitting: Medical Oncology

## 2022-05-31 VITALS — BP 114/72 | HR 90 | Temp 97.2°F | Ht 60.0 in | Wt 137.0 lb

## 2022-05-31 DIAGNOSIS — Z79899 Other long term (current) drug therapy: Secondary | ICD-10-CM | POA: Diagnosis not present

## 2022-05-31 DIAGNOSIS — C3491 Malignant neoplasm of unspecified part of right bronchus or lung: Secondary | ICD-10-CM

## 2022-05-31 DIAGNOSIS — C3411 Malignant neoplasm of upper lobe, right bronchus or lung: Secondary | ICD-10-CM | POA: Diagnosis not present

## 2022-05-31 DIAGNOSIS — M8580 Other specified disorders of bone density and structure, unspecified site: Secondary | ICD-10-CM | POA: Diagnosis not present

## 2022-05-31 DIAGNOSIS — Z79811 Long term (current) use of aromatase inhibitors: Secondary | ICD-10-CM | POA: Diagnosis not present

## 2022-05-31 DIAGNOSIS — Z5111 Encounter for antineoplastic chemotherapy: Secondary | ICD-10-CM | POA: Diagnosis not present

## 2022-05-31 DIAGNOSIS — T451X5A Adverse effect of antineoplastic and immunosuppressive drugs, initial encounter: Secondary | ICD-10-CM | POA: Diagnosis not present

## 2022-05-31 DIAGNOSIS — M791 Myalgia, unspecified site: Secondary | ICD-10-CM | POA: Diagnosis not present

## 2022-05-31 DIAGNOSIS — D0511 Intraductal carcinoma in situ of right breast: Secondary | ICD-10-CM

## 2022-05-31 LAB — COMPREHENSIVE METABOLIC PANEL
ALT: 11 U/L (ref 0–44)
AST: 17 U/L (ref 15–41)
Albumin: 3.5 g/dL (ref 3.5–5.0)
Alkaline Phosphatase: 68 U/L (ref 38–126)
Anion gap: 5 (ref 5–15)
BUN: 12 mg/dL (ref 8–23)
CO2: 27 mmol/L (ref 22–32)
Calcium: 8.5 mg/dL — ABNORMAL LOW (ref 8.9–10.3)
Chloride: 106 mmol/L (ref 98–111)
Creatinine, Ser: 0.66 mg/dL (ref 0.44–1.00)
GFR, Estimated: >60 mL/min (ref >60)
Glucose, Bld: 113 mg/dL — ABNORMAL HIGH (ref 70–99)
Potassium: 4 mmol/L (ref 3.5–5.1)
Sodium: 138 mmol/L (ref 135–145)
Total Bilirubin: 0.2 mg/dL — ABNORMAL LOW (ref 0.3–1.2)
Total Protein: 6.9 g/dL (ref 6.5–8.1)

## 2022-05-31 LAB — CBC WITH DIFFERENTIAL/PLATELET
Abs Immature Granulocytes: 0.06 10*3/uL (ref 0.00–0.07)
Basophils Absolute: 0.1 10*3/uL (ref 0.0–0.1)
Basophils Relative: 1 %
Eosinophils Absolute: 0 10*3/uL (ref 0.0–0.5)
Eosinophils Relative: 1 %
HCT: 32.7 % — ABNORMAL LOW (ref 36.0–46.0)
Hemoglobin: 10.3 g/dL — ABNORMAL LOW (ref 12.0–15.0)
Immature Granulocytes: 1 %
Lymphocytes Relative: 26 %
Lymphs Abs: 2.1 10*3/uL (ref 0.7–4.0)
MCH: 29.3 pg (ref 26.0–34.0)
MCHC: 31.5 g/dL (ref 30.0–36.0)
MCV: 92.9 fL (ref 80.0–100.0)
Monocytes Absolute: 0.7 10*3/uL (ref 0.1–1.0)
Monocytes Relative: 9 %
Neutro Abs: 5 10*3/uL (ref 1.7–7.7)
Neutrophils Relative %: 62 %
Platelets: 189 10*3/uL (ref 150–400)
RBC: 3.52 MIL/uL — ABNORMAL LOW (ref 3.87–5.11)
RDW: 17 % — ABNORMAL HIGH (ref 11.5–15.5)
WBC: 8 10*3/uL (ref 4.0–10.5)
nRBC: 0 % (ref 0.0–0.2)

## 2022-05-31 LAB — MAGNESIUM: Magnesium: 1.8 mg/dL (ref 1.7–2.4)

## 2022-05-31 MED FILL — Dexamethasone Sodium Phosphate Inj 100 MG/10ML: INTRAMUSCULAR | Qty: 1 | Status: AC

## 2022-05-31 MED FILL — Fosaprepitant Dimeglumine For IV Infusion 150 MG (Base Eq): INTRAVENOUS | Qty: 5 | Status: AC

## 2022-05-31 NOTE — Progress Notes (Signed)
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  INTERVAL HISTORY: Patient returns to clinic today for further evaluation and consideration of cycle 3 of Taxotere and cisplatin.  Michelle Moore was held for her second cycle due to previously having significant joint pains and aches following administration of this medication.  She reports that she also did not feel good after cycle 2 because her magnesium was low.  After magnesium correction she improved.  She is not currently taking a magnesium supplement and asked if she needs to do this today.  Other than this concern she reports that she is feeling well.  She has no neurologic complaints.  She denies any recent fevers or illnesses. She has a good appetite and denies weight loss.  She denies any chest pain, shortness of breath, cough, or hemoptysis.  She denies any nausea, vomiting, constipation, or diarrhea. She has no urinary complaints.  Patient offers no further specific complaints today.  REVIEW OF SYSTEMS:   Review of Systems  Constitutional: Negative.  Negative for fever, malaise/fatigue and weight loss.  Respiratory: Negative.  Negative for cough, hemoptysis and shortness of breath.   Cardiovascular: Negative.  Negative for chest pain and leg swelling.  Gastrointestinal: Negative.  Negative for abdominal pain.  Genitourinary: Negative.  Negative for dysuria.  Musculoskeletal: Negative.  Negative for back pain and myalgias.  Skin: Negative.  Negative for rash.  Neurological: Negative.  Negative for dizziness, sensory change, focal  weakness and weakness.  Psychiatric/Behavioral: Negative.  The patient is not nervous/anxious.     As per HPI. Otherwise, a complete review of systems is negative.  PAST MEDICAL HISTORY: Past Medical History:  Diagnosis Date   Adenoma of right adrenal gland 12/07/2021   a.) CT chest 12/07/2021; measured 2.3 cm; not FDG avid on 01/06/2022 PET CT.   Anxiety    a.) uses BZO (alprazolam) PRN   Aortic atherosclerosis (HCC)    Arthritis    Bladder mass 12/07/2021   a.) CT abd 12/07/2021 --> 9 x 8 mm nodular enhancing focus at the base of the bladder; suspicious for urothelial neoplasm.   COPD (chronic obstructive pulmonary disease) (HCC)    Coronary artery disease 10/16/2009   a.) LHC/PCI 10/16/2009: EF 60%; 50% OM1, 99% dRCA (3.0 x 18 mm Xience V DES). b.) LHC 07/31/2014: EF 65%; 40% pLAD, 20% pLCx, 40% OM1, 30% oRCA, 50% pRCA, 20% mRCA, 10% ISR stent to dRCA; further intervention deferred opting for med. mgmt.   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 b.) s/p lumpectomy, adjuvant XRT; currently on extended AI (anastrozole) therapy   Essential hypertension    GERD (gastroesophageal reflux disease)    HLD (hyperlipidemia)    Left thyroid nodule 12/07/2021   a.) CT chest; measured 2.6 cm   Long term current use of immunosuppressive drug    a.) MTX for RA.   OSA on CPAP    Osteopenia    Personal history of radiation therapy    Pneumonia  PRES (posterior reversible encephalopathy syndrome) 07/14/2014   Pulmonary nodules 11/08/2021   a.) CXR 11/08/2021: 50m nodular density RUL. b.) CT chest 12/07/2021: 1.9 x 1.4 x 2.5 spiculated lesion anterior RUL. c.) PET CT 01/06/2022: 2.2 x 1.9 spiculated anterior RUL mass (SUV max 16.4); d.) Bx (+) NSCLC, favoring SCC with tumor necrosis; p40/CK7/GATA3 (+), TTF-1 (-)   Rheumatoid arthritis ([facility]    a.) on MTX    Seasonal allergies    Seizures ([facility] [place]   a.) x 1 episode; etiology never determined.   Sinus headache    Squamous cell lung cancer, right ([facility] 03/17/2022   a.) RUL lobectomy 03/17/2022 --> Bx (+) for invasive moderately differentiated SCC of the RIGHT upper lobe (PDL-1: 5%) --> stage IIIA SCC (pT3, pN1, cM0)   ST elevation myocardial infarction (STEMI) of inferior wall ([facility] 10/16/2009   a.) LHC/PCI 10/16/2009: EF 60%; 50% OM1, 99% dRCA (3.0 x 18 mm Xience V DES).   Tubular adenoma    Valvular heart disease     PAST SURGICAL HISTORY: Past Surgical History:  Procedure Laterality Date   ABDOMINAL HYSTERECTOMY     ANTERIOR CERVICAL DECOMP/DISCECTOMY FUSION N/A 01/20/2014   Procedure: ANTERIOR CERVICAL DECOMPRESSION/DISCECTOMY FUSION 2 LEVELS cervical five/six six /[place]  Surgeon: JOphelia Charter MD;  [facility];  Service: Neurosurgery;  Laterality: N/A;   APPENDECTOMY     BACK SURGERY     lumbar   BLADDER INSTILLATION N/A 05/02/2022   Procedure: BLADDER INSTILLATION OF GEMCITABINE;  Surgeon: BHollice Espy MD;  Location: [facility];  Service: Urology;  Laterality: N/A;   BREAST BIOPSY Right 02/01/2018   x shape, DCIS    BREAST BIOPSY  01/16/2019   coil, BENIGN MAMMARY TISSUE WITH MODERATE STROMAL FIBROSIS AND COARSE DYSTROPHIC CALCIFICATIONS of the RIGHT breast   BREAST LUMPECTOMY Right 03/07/2018   Procedure: BREAST LUMPECTOMY/ RE EXCISION;  Surgeon: CHerbert Pun MD;  [facility];  Service: General;  Laterality: Right;   CARDIAC CATHETERIZATION Left 07/31/2014   Procedure: CARDIAC CATHETERIZATION; Location: [facility]; Surgeon: BSerafina Royals MD   COLONOSCOPY WITH PROPOFOL N/A 02/11/2021   Procedure: COLONOSCOPY WITH PROPOFOL;  Surgeon: WLucilla Lame MD;  [facility]  Service: Endoscopy;  Laterality: N/A;  Requests Early   CORONARY ANGIOPLASTY WITH STENT PLACEMENT Left 10/16/2009   Procedure: CORONARY ANGIOPLASTY WITH STENT PLACEMENT;  [facility] Surgeon: DKatrine Coho MD   CYSTOSCOPY W/ RETROGRADES Bilateral 05/02/2022   Procedure: CYSTOSCOPY WITH RETROGRADE PYELOGRAM;  Surgeon: BHollice Espy MD;  Location: [facility];  Service: Urology;  Laterality: Bilateral;   INTERCOSTAL NERVE BLOCK Right 03/17/2022   Procedure: INTERCOSTAL NERVE BLOCK;  Surgeon: HMelrose Nakayama MD;  [place]  Service: Thoracic;  Laterality: Right;   LASIK Bilateral    LUNG LOBECTOMY Right 03/17/2022   right upper lobe   LYMPH NODE DISSECTION Right 03/17/2022   Procedure: LYMPH NODE DISSECTION;  Surgeon: HMelrose Nakayama MD;  [facility]  Service: Thoracic;  Laterality: Right;   PARTIAL MASTECTOMY WITH NEEDLE LOCALIZATION Right 02/21/2018   Procedure: PARTIAL MASTECTOMY WITH NEEDLE LOCALIZATION;  Surgeon: CHerbert Pun MD;  Location: [facility];  Service: General;  Laterality: Right;   POLYPECTOMY N/A 02/11/2021   Procedure: POLYPECTOMY;  Surgeon: WLucilla Lame MD;  [facility]  Service: Endoscopy;  Laterality: N/A;   ROTATOR CUFF REPAIR Left    SHOULDER ARTHROSCOPY WITH ROTATOR CUFF REPAIR AND OPEN BICEPS TENODESIS Right 08/07/2019   Procedure: SHOULDER ARTHROSCOPY WITH DEBRIDEMENT, DECOMPRESSION, ROTATOR  CUFF REPAIR AND BICEPS TENODESIS. - RNFA;  Surgeon: Corky Mull, MD;  Location: [facility];  Service: Orthopedics;  Laterality: Right;   TOE SURGERY Right    has pin and plate in it   TRANSURETHRAL RESECTION OF BLADDER TUMOR N/A 05/02/2022   Procedure: TRANSURETHRAL RESECTION OF BLADDER TUMOR (TURBT);  Surgeon: Hollice Espy, MD;  Location: [facility];  Service: Urology;  Laterality: N/A;   VIDEO BRONCHOSCOPY WITH ENDOBRONCHIAL ULTRASOUND N/A 03/02/2022   Procedure: VIDEO BRONCHOSCOPY WITH ENDOBRONCHIAL ULTRASOUND;  Surgeon: Tyler Pita, MD;  Location: [facility];  Service: Cardiopulmonary;  Laterality: N/A;    FAMILY HISTORY: Family History  Problem Relation Age of Onset   Breast  cancer Cousin 74       bilateral at 34 and 87; daughter of maternal aunt who was unaffected   Lung cancer Maternal Aunt        dx 67s; deceased 57s; smoker   Heart attack Father        deceased 18   Stomach cancer Maternal Grandmother     ADVANCED DIRECTIVES (Y/N):  N  HEALTH MAINTENANCE: Social History   Tobacco Use   Smoking status: Former    Packs/day: 1.00    Years: 30.00    Total pack years: 30.00    Types: Cigarettes    Quit date: 09/13/2015    Years since quitting: 6.7   Smokeless tobacco: Never  Vaping Use   Vaping Use: Never used  Substance Use Topics   Alcohol use: Yes    Comment: occasional   Drug use: No     Colonoscopy:  PAP:  Bone density:  Lipid panel:  Allergies  Allergen Reactions   Other     BANDAIDS-OF LEFT ON FOR AN EXTENDED PERIOD OF TIME  Patient states latex rubber gloves are ok    Current Outpatient Medications  Medication Sig Dispense Refill   albuterol (VENTOLIN HFA) 108 (90 Base) MCG/ACT inhaler Inhale 1-2 puffs into the lungs every 6 (six) hours as needed for wheezing or shortness of breath. 1 g 1   alendronate (FOSAMAX) 70 MG tablet Take 1 tablet (70 mg total) by mouth once a week. Take with a full glass of water on an empty stomach. Stand or sit upright for 1 hour after taking (Patient taking differently: Take 70 mg by mouth once a week. Take with a full glass of water on an empty stomach. Stand or sit upright for 1 hour after taking WEDNESDAY) 12 tablet 1   ALPRAZolam (XANAX) 0.25 MG tablet Take 1 tablet (0.25 mg total) by mouth at bedtime as needed for anxiety or sleep. 30 tablet 0   anastrozole (ARIMIDEX) 1 MG tablet Take 1 tablet (1 mg total) by mouth at bedtime. 30 tablet 0   aspirin-acetaminophen-caffeine (EXCEDRIN MIGRAINE) 825-053-97 MG tablet Take by mouth every 6 (six) hours as needed for headache.     fluticasone (FLONASE) 50 MCG/ACT nasal spray Place 2 sprays into both nostrils daily as needed for allergies. 60 mL 1    fluticasone-salmeterol (ADVAIR) 250-50 MCG/ACT AEPB Inhale 1 puff into the lungs in the morning and at bedtime. 60 each 5   folic acid (FOLVITE) 1 MG tablet Take 2 mg by mouth in the morning.     gabapentin (NEURONTIN) 300 MG capsule Take 1 capsule (300 mg total) by mouth 2 (two) times daily. 60 capsule 6   HYDROcodone-acetaminophen (NORCO/VICODIN) 5-325 MG tablet Take 1-2 tablets by mouth every 6 (six) hours as needed for moderate pain.  10 tablet 0   methotrexate (RHEUMATREX) 2.5 MG tablet Take 20 mg by mouth once a week. WEDNESDAY     montelukast (SINGULAIR) 10 MG tablet Take 1 tablet (10 mg total) by mouth at bedtime. 90 tablet 1   omeprazole (PRILOSEC) 40 MG capsule Take 1 capsule (40 mg total) by mouth daily. 30 capsule 3   oxybutynin (DITROPAN) 5 MG tablet Take 1 tablet (5 mg total) by mouth every 8 (eight) hours as needed for bladder spasms. 30 tablet 0   potassium chloride (KLOR-CON) 10 MEQ tablet TAKE (1) TABLET BY MOUTH EVERY DAY 90 tablet 1   rosuvastatin (CRESTOR) 40 MG tablet Take 40 mg by mouth every evening.     sertraline (ZOLOFT) 100 MG tablet TAKE (1) TABLET BY MOUTH EVERY DAY 90 tablet 1   traMADol (ULTRAM) 50 MG tablet TAKE (1) TABLET BY MOUTH THREE TIMES A DAY AS NEEDED     traMADol (ULTRAM) 50 MG tablet Take 1-2 tablets (50-100 mg total) by mouth 3 (three) times daily as needed. 60 tablet 0   No current facility-administered medications for this visit.    OBJECTIVE: Vitals:   05/31/22 0929  BP: 114/72  Pulse: 90  Temp: (!) 97.2 F (36.2 C)  SpO2: 96%     Body mass index is 26.76 kg/m.    ECOG FS:0 - Asymptomatic  General: Well-developed, well-nourished, no acute distress. Eyes: Pink conjunctiva, anicteric sclera. HEENT: Normocephalic, moist mucous membranes. Lungs: No audible wheezing or coughing. Heart: Regular rate and rhythm. Abdomen: Soft, nontender, no obvious distention. Musculoskeletal: No edema, cyanosis, or clubbing. Neuro: Alert, answering all  questions appropriately. Cranial nerves grossly intact. Skin: No rashes or petechiae noted. Psych: Normal affect.   LAB RESULTS:  Lab Results  Component Value Date   NA 138 05/31/2022   K 4.0 05/31/2022   CL 106 05/31/2022   CO2 27 05/31/2022   GLUCOSE 113 (H) 05/31/2022   BUN 12 05/31/2022   CREATININE 0.66 05/31/2022   CALCIUM 8.5 (L) 05/31/2022   PROT 6.9 05/31/2022   ALBUMIN 3.5 05/31/2022   AST 17 05/31/2022   ALT 11 05/31/2022   ALKPHOS 68 05/31/2022   BILITOT 0.2 (L) 05/31/2022   GFRNONAA >60 05/31/2022   GFRAA >60 04/09/2020    Lab Results  Component Value Date   WBC 8.0 05/31/2022   NEUTROABS 5.0 05/31/2022   HGB 10.3 (L) 05/31/2022   HCT 32.7 (L) 05/31/2022   MCV 92.9 05/31/2022   PLT 189 05/31/2022     STUDIES: DG Chest 2 View  Result Date: 05/03/2022 CLINICAL DATA:  Lung cancer EXAM: CHEST - 2 VIEW COMPARISON:  Chest 04/05/2022 FINDINGS: Postop right upper lobectomy changes stable. No new area of infiltrate or collapse. No effusion. Improved aeration in the right middle lobe which is now clear IMPRESSION: Improved aeration right lung base. Postop changes right middle lobe stable Electronically Signed   By: Franchot Gallo M.D.   On: 05/03/2022 09:29   DG OR UROLOGY CYSTO IMAGE ([facility] ONLY)  Result Date: 05/02/2022 There is no interpretation for this exam.  This order is for images obtained during a surgical procedure.  Please See "Surgeries" Tab for more information regarding the procedure.    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  PLAN:    1.  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  She was also noted to have 1 of 33 lymph nodes positive for disease.  This increased her to a stage IIIa.  Have recommended adjuvant  chemotherapy using cisplatin plus Taxotere every 3 weeks for 4 cycles with Udenyca support.  MRI of the brain on April 21, 2022 reviewed independently and report as above with no obvious evidence of metastatic disease.  Labs reviewed and acceptable for treatment today.  We will proceed forward with cycle 3 today after cisplatin plus Taxotere.  She is elected to hold Udenyca at this time.  She has white blood cell count of 8.  She will start magnesium glycine 8 over-the-counter as directed. 2. DCIS, right breast: Patient underwent lumpectomy followed by adjuvant XRT completing in September 2019.  Because there was no invasive component on her pathology, she did not require adjuvant chemotherapy. Patient could not tolerate tamoxifen or letrozole secondary to worsening joint pain, therefore was switched to anastrozole.  She is tolerating this well and will complete 5 years of treatment in September 2024.  Her most recent mammogram on March 11, 2022 was reported BI-RADS 1.  Repeat in June 2024.  Today there is no change to her plan as previously reported above. 3.  Osteopenia: Patient's most recent bone mineral density on March 25, 2021 revealed a T score of -2.3 which is slightly worse than previous where her T score was reported -1.9 and -1.4 and years prior.  Continue calcium, vitamin D, and alendronate.  Today there is no change to her plan as reported above. 4.  Surgical site tenderness, body aches: Continue gabapentin and tramadol as prescribed.  Holding Udenyca for cycle 3 but may consider it for cycle 4 if needed.    Disposition  Ok for cycle 3 tomorrow of Cisplatin + Docetaxel RTC 1 week APP, labs (CBC W/, CMP, mag) +- K +- mag +- fluids RTC 3 weeks D1 MD, labs (CBC W/, CMP, Mag), D2 treatment D3 Udenyca -[place]  Encounter Diagnoses  Name Primary?   Squamous cell carcinoma of right lung (HCC)    Ductal carcinoma in situ (DCIS) of right breast Yes   Aromatase inhibitor use    Osteopenia due to cancer  therapy    Hypomagnesemia      Hughie Closs, PA-C 05/31/2022 11:42 AM   Cancer Staging  Ductal carcinoma in situ (DCIS) of right breast Staging form: Breast, AJCC 8th Edition - Clinical: Stage 0 (cTis (DCIS), cN0, cM0, ER+, PR-, HER2-) - Signed by Lloyd Huger, MD on 03/18/2018 Nuclear grade: G3 Laterality: Right  Squamous cell carcinoma of right lung ([place]) Staging form: Lung, AJCC 8th Edition - Pathologic stage from 04/07/2022: Stage IIIA (pT3, pN1, cM0) - Signed by Lloyd Huger, MD on 04/07/2022

## 2022-06-01 ENCOUNTER — Other Ambulatory Visit: Payer: Self-pay | Admitting: Oncology

## 2022-06-01 ENCOUNTER — Inpatient Hospital Stay: Payer: Medicare HMO

## 2022-06-01 ENCOUNTER — Telehealth: Payer: Self-pay | Admitting: Oncology

## 2022-06-01 VITALS — BP 138/85 | HR 96 | Temp 97.2°F | Resp 16

## 2022-06-01 DIAGNOSIS — C3491 Malignant neoplasm of unspecified part of right bronchus or lung: Secondary | ICD-10-CM

## 2022-06-01 DIAGNOSIS — M791 Myalgia, unspecified site: Secondary | ICD-10-CM | POA: Diagnosis not present

## 2022-06-01 DIAGNOSIS — T451X5A Adverse effect of antineoplastic and immunosuppressive drugs, initial encounter: Secondary | ICD-10-CM | POA: Diagnosis not present

## 2022-06-01 DIAGNOSIS — D0511 Intraductal carcinoma in situ of right breast: Secondary | ICD-10-CM | POA: Diagnosis not present

## 2022-06-01 DIAGNOSIS — Z79899 Other long term (current) drug therapy: Secondary | ICD-10-CM | POA: Diagnosis not present

## 2022-06-01 DIAGNOSIS — Z5111 Encounter for antineoplastic chemotherapy: Secondary | ICD-10-CM | POA: Diagnosis not present

## 2022-06-01 DIAGNOSIS — Z79811 Long term (current) use of aromatase inhibitors: Secondary | ICD-10-CM | POA: Diagnosis not present

## 2022-06-01 DIAGNOSIS — C3411 Malignant neoplasm of upper lobe, right bronchus or lung: Secondary | ICD-10-CM | POA: Diagnosis not present

## 2022-06-01 DIAGNOSIS — M8580 Other specified disorders of bone density and structure, unspecified site: Secondary | ICD-10-CM | POA: Diagnosis not present

## 2022-06-01 MED ORDER — SODIUM CHLORIDE 0.9 % IV SOLN
150.0000 mg | Freq: Once | INTRAVENOUS | Status: AC
  Administered 2022-06-01: 150 mg via INTRAVENOUS
  Filled 2022-06-01: qty 150

## 2022-06-01 MED ORDER — SODIUM CHLORIDE 0.9 % IV SOLN
75.0000 mg/m2 | Freq: Once | INTRAVENOUS | Status: AC
  Administered 2022-06-01: 120 mg via INTRAVENOUS
  Filled 2022-06-01: qty 12

## 2022-06-01 MED ORDER — MAGNESIUM SULFATE 2 GM/50ML IV SOLN
2.0000 g | Freq: Once | INTRAVENOUS | Status: AC
  Administered 2022-06-01: 2 g via INTRAVENOUS
  Filled 2022-06-01: qty 50

## 2022-06-01 MED ORDER — SODIUM CHLORIDE 0.9 % IV SOLN
10.0000 mg | Freq: Once | INTRAVENOUS | Status: AC
  Administered 2022-06-01: 10 mg via INTRAVENOUS
  Filled 2022-06-01: qty 10

## 2022-06-01 MED ORDER — POTASSIUM CHLORIDE IN NACL 20-0.9 MEQ/L-% IV SOLN
Freq: Once | INTRAVENOUS | Status: AC
  Filled 2022-06-01: qty 1000

## 2022-06-01 MED ORDER — PALONOSETRON HCL INJECTION 0.25 MG/5ML
0.2500 mg | Freq: Once | INTRAVENOUS | Status: AC
  Administered 2022-06-01: 0.25 mg via INTRAVENOUS
  Filled 2022-06-01: qty 5

## 2022-06-01 MED ORDER — SODIUM CHLORIDE 0.9 % IV SOLN
75.0000 mg/m2 | Freq: Once | INTRAVENOUS | Status: AC
  Administered 2022-06-01: 122 mg via INTRAVENOUS
  Filled 2022-06-01: qty 50

## 2022-06-01 MED ORDER — SODIUM CHLORIDE 0.9 % IV SOLN
Freq: Once | INTRAVENOUS | Status: AC
  Filled 2022-06-01: qty 250

## 2022-06-01 NOTE — Telephone Encounter (Signed)
Pt is currently scheduled for 10/17 infusion. Her tx plan and check out note states she needs one for 10/10. I will need an updated order

## 2022-06-01 NOTE — Patient Instructions (Signed)
[facility] AT [facility]  Discharge Instructions: Thank you for choosing [facility] to provide your oncology and hematology care.  If you have a lab appointment with the [facility], please go directly to the [place] and check in at the registration area.  Wear comfortable clothing and clothing appropriate for easy access to any Portacath or PICC line.   We strive to give you quality time with your provider. You may need to reschedule your appointment if you arrive late (15 or more minutes).  Arriving late affects you and other patients whose appointments are after yours.  Also, if you miss three or more appointments without notifying the office, you may be dismissed from the clinic at the provider's discretion.      For prescription refill requests, have your pharmacy contact our office and allow 72 hours for refills to be completed.    Today you received the following chemotherapy and/or immunotherapy agents Cisplatin & Taxol      To help prevent nausea and vomiting after your treatment, we encourage you to take your nausea medication as directed.  BELOW ARE SYMPTOMS THAT SHOULD BE REPORTED IMMEDIATELY: *FEVER GREATER THAN 100.4 F (38 C) OR HIGHER *CHILLS OR SWEATING *NAUSEA AND VOMITING THAT IS NOT CONTROLLED WITH YOUR NAUSEA MEDICATION *UNUSUAL SHORTNESS OF BREATH *UNUSUAL BRUISING OR BLEEDING *URINARY PROBLEMS (pain or burning when urinating, or frequent urination) *BOWEL PROBLEMS (unusual diarrhea, constipation, pain near the anus) TENDERNESS IN MOUTH AND THROAT WITH OR WITHOUT PRESENCE OF ULCERS (sore throat, sores in mouth, or a toothache) UNUSUAL RASH, SWELLING OR PAIN  UNUSUAL VAGINAL DISCHARGE OR ITCHING   Items with * indicate a potential emergency and should be followed up as soon as possible or go to the Emergency Department if any problems should occur.  Please show the CHEMOTHERAPY ALERT CARD or IMMUNOTHERAPY ALERT CARD at  check-in to the Emergency Department and triage nurse.  Should you have questions after your visit or need to cancel or reschedule your appointment, please contact [facility] [place] AT [place]  [phone] and follow the prompts.  Office hours are 8:00 a.m. to 4:30 p.m. Monday - Friday. Please note that voicemails left after 4:00 p.m. may not be returned until the following business day.  We are closed weekends and major holidays. You have access to a nurse at all times for urgent questions. Please call the main number to the clinic [phone] and follow the prompts.  For any non-urgent questions, you may also contact your provider using MyChart. We now offer e-Visits for anyone 74 and older to request care online for non-urgent symptoms. For details visit [email or web address].   Also download the MyChart app! Go to the app store, search "MyChart", open the app, select , and log in with your MyChart username and password.  Masks are optional in the cancer centers. If you would like for your care team to wear a mask while they are taking care of you, please let them know. For doctor visits, patients may have with them one support person who is at least 65 years old. At this time, visitors are not allowed in the infusion area.

## 2022-06-02 ENCOUNTER — Ambulatory Visit: Payer: Medicare HMO | Admitting: Internal Medicine

## 2022-06-03 ENCOUNTER — Inpatient Hospital Stay: Payer: Medicare HMO

## 2022-06-03 ENCOUNTER — Other Ambulatory Visit: Payer: Self-pay | Admitting: *Deleted

## 2022-06-03 DIAGNOSIS — C3491 Malignant neoplasm of unspecified part of right bronchus or lung: Secondary | ICD-10-CM

## 2022-06-07 ENCOUNTER — Inpatient Hospital Stay: Payer: Medicare HMO

## 2022-06-07 ENCOUNTER — Inpatient Hospital Stay (HOSPITAL_BASED_OUTPATIENT_CLINIC_OR_DEPARTMENT_OTHER): Payer: Medicare HMO | Admitting: Nurse Practitioner

## 2022-06-07 VITALS — BP 133/74 | HR 80 | Temp 98.6°F | Resp 16

## 2022-06-07 VITALS — BP 130/71 | HR 93 | Temp 97.2°F | Resp 16 | Wt 134.8 lb

## 2022-06-07 DIAGNOSIS — Z5111 Encounter for antineoplastic chemotherapy: Secondary | ICD-10-CM | POA: Diagnosis not present

## 2022-06-07 DIAGNOSIS — C3491 Malignant neoplasm of unspecified part of right bronchus or lung: Secondary | ICD-10-CM | POA: Diagnosis not present

## 2022-06-07 DIAGNOSIS — D0511 Intraductal carcinoma in situ of right breast: Secondary | ICD-10-CM | POA: Diagnosis not present

## 2022-06-07 DIAGNOSIS — T451X5A Adverse effect of antineoplastic and immunosuppressive drugs, initial encounter: Secondary | ICD-10-CM | POA: Diagnosis not present

## 2022-06-07 DIAGNOSIS — G893 Neoplasm related pain (acute) (chronic): Secondary | ICD-10-CM

## 2022-06-07 DIAGNOSIS — M898X9 Other specified disorders of bone, unspecified site: Secondary | ICD-10-CM | POA: Diagnosis not present

## 2022-06-07 DIAGNOSIS — Z79811 Long term (current) use of aromatase inhibitors: Secondary | ICD-10-CM | POA: Diagnosis not present

## 2022-06-07 DIAGNOSIS — R110 Nausea: Secondary | ICD-10-CM | POA: Diagnosis not present

## 2022-06-07 DIAGNOSIS — Z79899 Other long term (current) drug therapy: Secondary | ICD-10-CM | POA: Diagnosis not present

## 2022-06-07 DIAGNOSIS — C3411 Malignant neoplasm of upper lobe, right bronchus or lung: Secondary | ICD-10-CM | POA: Diagnosis not present

## 2022-06-07 DIAGNOSIS — M791 Myalgia, unspecified site: Secondary | ICD-10-CM | POA: Diagnosis not present

## 2022-06-07 DIAGNOSIS — M8580 Other specified disorders of bone density and structure, unspecified site: Secondary | ICD-10-CM | POA: Diagnosis not present

## 2022-06-07 LAB — COMPREHENSIVE METABOLIC PANEL
ALT: 12 U/L (ref 0–44)
AST: 19 U/L (ref 15–41)
Albumin: 3.7 g/dL (ref 3.5–5.0)
Alkaline Phosphatase: 59 U/L (ref 38–126)
Anion gap: 5 (ref 5–15)
BUN: 18 mg/dL (ref 8–23)
CO2: 26 mmol/L (ref 22–32)
Calcium: 8.4 mg/dL — ABNORMAL LOW (ref 8.9–10.3)
Chloride: 105 mmol/L (ref 98–111)
Creatinine, Ser: 0.76 mg/dL (ref 0.44–1.00)
GFR, Estimated: >60 mL/min (ref >60)
Glucose, Bld: 117 mg/dL — ABNORMAL HIGH (ref 70–99)
Potassium: 4 mmol/L (ref 3.5–5.1)
Sodium: 136 mmol/L (ref 135–145)
Total Bilirubin: 0.4 mg/dL (ref 0.3–1.2)
Total Protein: 6.8 g/dL (ref 6.5–8.1)

## 2022-06-07 LAB — CBC WITH DIFFERENTIAL/PLATELET
Abs Immature Granulocytes: 0.02 10*3/uL (ref 0.00–0.07)
Basophils Absolute: 0.1 10*3/uL (ref 0.0–0.1)
Basophils Relative: 2 %
Eosinophils Absolute: 0.1 10*3/uL (ref 0.0–0.5)
Eosinophils Relative: 3 %
HCT: 33.5 % — ABNORMAL LOW (ref 36.0–46.0)
Hemoglobin: 10.8 g/dL — ABNORMAL LOW (ref 12.0–15.0)
Immature Granulocytes: 1 %
Lymphocytes Relative: 36 %
Lymphs Abs: 1.1 10*3/uL (ref 0.7–4.0)
MCH: 29.3 pg (ref 26.0–34.0)
MCHC: 32.2 g/dL (ref 30.0–36.0)
MCV: 91 fL (ref 80.0–100.0)
Monocytes Absolute: 0.1 10*3/uL (ref 0.1–1.0)
Monocytes Relative: 2 %
Neutro Abs: 1.7 10*3/uL (ref 1.7–7.7)
Neutrophils Relative %: 56 %
Platelets: 177 10*3/uL (ref 150–400)
RBC: 3.68 MIL/uL — ABNORMAL LOW (ref 3.87–5.11)
RDW: 17.1 % — ABNORMAL HIGH (ref 11.5–15.5)
Smear Review: NORMAL
WBC: 3 10*3/uL — ABNORMAL LOW (ref 4.0–10.5)
nRBC: 0 % (ref 0.0–0.2)

## 2022-06-07 LAB — MAGNESIUM: Magnesium: 1.5 mg/dL — ABNORMAL LOW (ref 1.7–2.4)

## 2022-06-07 MED ORDER — OLANZAPINE 10 MG PO TABS: 10.0000 mg | ORAL_TABLET | Freq: Every day | ORAL | 0 refills | Status: DC

## 2022-06-07 MED ORDER — ONDANSETRON HCL 8 MG PO TABS: 8.0000 mg | ORAL_TABLET | Freq: Three times a day (TID) | ORAL | 0 refills | Status: DC | PRN

## 2022-06-07 MED ORDER — SODIUM CHLORIDE 0.9 % IV SOLN
1.0000 g | Freq: Once | INTRAVENOUS | Status: AC
  Administered 2022-06-07: 1 g via INTRAVENOUS
  Filled 2022-06-07: qty 10

## 2022-06-07 MED ORDER — DEXAMETHASONE SODIUM PHOSPHATE 10 MG/ML IJ SOLN
5.0000 mg | Freq: Once | INTRAMUSCULAR | Status: AC
  Administered 2022-06-07: 5 mg via INTRAVENOUS
  Filled 2022-06-07: qty 1

## 2022-06-07 MED ORDER — FAMOTIDINE IN NACL 20-0.9 MG/50ML-% IV SOLN
20.0000 mg | Freq: Once | INTRAVENOUS | Status: AC
  Administered 2022-06-07: 20 mg via INTRAVENOUS
  Filled 2022-06-07: qty 50

## 2022-06-07 MED ORDER — SODIUM CHLORIDE 0.9 % IV SOLN: 5.0000 mg | Freq: Once | INTRAVENOUS | Status: DC

## 2022-06-07 MED ORDER — MAGNESIUM SULFATE 4 GM/100ML IV SOLN
4.0000 g | Freq: Once | INTRAVENOUS | Status: AC
  Administered 2022-06-07: 4 g via INTRAVENOUS
  Filled 2022-06-07: qty 100

## 2022-06-07 MED ORDER — SODIUM CHLORIDE 0.9 % IV SOLN
INTRAVENOUS | Status: DC
  Filled 2022-06-07 (×2): qty 250

## 2022-06-07 MED ORDER — HYDROCODONE-ACETAMINOPHEN 5-325 MG PO TABS: 1.0000 | ORAL_TABLET | Freq: Four times a day (QID) | ORAL | 0 refills | Status: DC | PRN

## 2022-06-07 NOTE — Progress Notes (Signed)
Received IVF's with supportive meds. Pt reports reflux, heartburn is improved. Discharged to home at completion.

## 2022-06-07 NOTE — Progress Notes (Signed)
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  INTERVAL HISTORY: Patient returns to clinic today for further evaluation and follow up after cycle 3 of taxotere and cisplatin.   and consideration of cycle 3 of Taxotere and cisplatin.  Ellen Henri was held for her second cycle due to previously having significant joint pains and aches following administration of this medication.  She reports that she also did not feel good after cycle 2 because her magnesium was low.  After magnesium correction she improved.  She is not currently taking a magnesium supplement and asked if she needs to do this today.  Other than this concern she reports that she is feeling well.  She has no neurologic complaints.  She denies any recent fevers or illnesses. She has a good appetite and denies weight loss.  She denies any chest pain, shortness of breath, cough, or hemoptysis.  She denies any nausea, vomiting, constipation, or diarrhea. She has no urinary complaints.  Patient offers no further specific complaints today.  REVIEW OF SYSTEMS:   Review of Systems  Constitutional: Negative.  Negative for fever, malaise/fatigue and weight loss.  Respiratory: Negative.  Negative for cough, hemoptysis and shortness of breath.   Cardiovascular: Negative.  Negative for chest pain and leg swelling.  Gastrointestinal: Negative.  Negative for abdominal pain.  Genitourinary: Negative.  Negative for dysuria.  Musculoskeletal: Negative.  Negative for back pain and myalgias.  Skin: Negative.  Negative for rash.  Neurological:  Negative.  Negative for dizziness, sensory change, focal weakness and weakness.  Psychiatric/Behavioral: Negative.  The patient is not nervous/anxious.   As per HPI. Otherwise, a complete review of systems is negative.  PAST MEDICAL HISTORY: Past Medical History:  Diagnosis Date   Adenoma of right adrenal gland 12/07/2021   a.) CT chest 12/07/2021; measured 2.3 cm; not FDG avid on 01/06/2022 PET CT.   Anxiety    a.) uses BZO (alprazolam) PRN   Aortic atherosclerosis (HCC)    Arthritis    Bladder mass 12/07/2021   a.) CT abd 12/07/2021 --> 9 x 8 mm nodular enhancing focus at the base of the bladder; suspicious for urothelial neoplasm.   COPD (chronic obstructive pulmonary disease) (HCC)    Coronary artery disease 10/16/2009   a.) LHC/PCI 10/16/2009: EF 60%; 50% OM1, 99% dRCA (3.0 x 18 mm Xience V DES). b.) LHC 07/31/2014: EF 65%; 40% pLAD, 20% pLCx, 40% OM1, 30% oRCA, 50% pRCA, 20% mRCA, 10% ISR stent to dRCA; further intervention deferred opting for med. mgmt.   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 b.) s/p lumpectomy, adjuvant XRT; currently on extended AI (anastrozole) therapy   Essential hypertension    GERD (gastroesophageal reflux disease)    HLD (hyperlipidemia)    Left thyroid nodule 12/07/2021   a.) CT chest; measured 2.6 cm   Long term current use of immunosuppressive drug    a.) MTX for RA.   OSA on CPAP    Osteopenia  Personal history of radiation therapy    Pneumonia    PRES (posterior reversible encephalopathy syndrome) 07/14/2014   Pulmonary nodules 11/08/2021   a.) CXR 11/08/2021: 86m nodular density RUL. b.) CT chest 12/07/2021: 1.9 x 1.4 x 2.5 spiculated lesion anterior RUL. c.) PET CT 01/06/2022: 2.2 x 1.9 spiculated anterior RUL mass (SUV max 16.4); d.) Bx (+) NSCLC, favoring SCC with tumor necrosis; p40/CK7/GATA3 (+), TTF-1  (-)   Rheumatoid arthritis ([facility]    a.) on MTX   Seasonal allergies    Seizures ([facility] [place]   a.) x 1 episode; etiology never determined.   Sinus headache    Squamous cell lung cancer, right ([facility] 03/17/2022   a.) RUL lobectomy 03/17/2022 --> Bx (+) for invasive moderately differentiated SCC of the RIGHT upper lobe (PDL-1: 5%) --> stage IIIA SCC (pT3, pN1, cM0)   ST elevation myocardial infarction (STEMI) of inferior wall ([facility] 10/16/2009   a.) LHC/PCI 10/16/2009: EF 60%; 50% OM1, 99% dRCA (3.0 x 18 mm Xience V DES).   Tubular adenoma    Valvular heart disease     PAST SURGICAL HISTORY: Past Surgical History:  Procedure Laterality Date   ABDOMINAL HYSTERECTOMY     ANTERIOR CERVICAL DECOMP/DISCECTOMY FUSION N/A 01/20/2014   Procedure: ANTERIOR CERVICAL DECOMPRESSION/DISCECTOMY FUSION 2 LEVELS cervical five/six six /[place] Kos  Surgeon: JOphelia Charter MD;  [facility];  Service: Neurosurgery;  Laterality: N/A;   APPENDECTOMY     BACK SURGERY     lumbar   BLADDER INSTILLATION N/A 05/02/2022   Procedure: BLADDER INSTILLATION OF GEMCITABINE;  Surgeon: BHollice Espy MD;  Location: [facility];  Service: Urology;  Laterality: N/A;   BREAST BIOPSY Right 02/01/2018   x shape, DCIS    BREAST BIOPSY  01/16/2019   coil, BENIGN MAMMARY TISSUE WITH MODERATE STROMAL FIBROSIS AND COARSE DYSTROPHIC CALCIFICATIONS of the RIGHT breast   BREAST LUMPECTOMY Right 03/07/2018   Procedure: BREAST LUMPECTOMY/ RE EXCISION;  Surgeon: CHerbert Pun MD;  [facility];  Service: General;  Laterality: Right;   CARDIAC CATHETERIZATION Left 07/31/2014   Procedure: CARDIAC CATHETERIZATION; Location: [facility]; Surgeon: BSerafina Royals MD   COLONOSCOPY WITH PROPOFOL N/A 02/11/2021   Procedure: COLONOSCOPY WITH PROPOFOL;  Surgeon: WLucilla Lame MD;  [facility] [facility]  Service: Endoscopy;  Laterality: N/A;  Requests Early   CORONARY ANGIOPLASTY WITH STENT PLACEMENT Left 10/16/2009    Procedure: CORONARY ANGIOPLASTY WITH STENT PLACEMENT; [facility] [place]geon: DKatrine Coho MD   CYSTOSCOPY W/ RETROGRADES Bilateral 05/02/2022   Procedure: CYSTOSCOPY WITH RETROGRADE PYELOGRAM;  Surgeon: BHollice Espy MD;  Location: [facility];  Service: Urology;  Laterality: Bilateral;   INTERCOSTAL NERVE BLOCK Right 03/17/2022   Procedure: INTERCOSTAL NERVE BLOCK;  Surgeon: HMelrose Nakayama MD;  [facility]  Service: Thoracic;  Laterality: Right;   LASIK Bilateral    LUNG LOBECTOMY Right 03/17/2022   right upper lobe   LYMPH NODE DISSECTION Right 03/17/2022   Procedure: LYMPH NODE DISSECTION;  Surgeon: HMelrose Nakayama MD;  [place] [facility]  Service: Thoracic;  Laterality: Right;   PARTIAL MASTECTOMY WITH NEEDLE LOCALIZATION Right 02/21/2018   Procedure: PARTIAL MASTECTOMY WITH NEEDLE LOCALIZATION;  Surgeon: CHerbert Pun MD;  Location: [facility];  Service: General;  Laterality: Right;   POLYPECTOMY N/A 02/11/2021   Procedure: POLYPECTOMY;  Surgeon: WLucilla Lame MD;  [place] [facility]  Service: Endoscopy;  Laterality: N/A;   ROTATOR CUFF REPAIR Left    SHOULDER ARTHROSCOPY WITH ROTATOR CUFF REPAIR AND OPEN BICEPS  TENODESIS Right 08/07/2019   Procedure: SHOULDER ARTHROSCOPY WITH DEBRIDEMENT, DECOMPRESSION, ROTATOR CUFF REPAIR AND BICEPS TENODESIS. - RNFA;  Surgeon: Corky Mull, MD;  Location: [facility];  Service: Orthopedics;  Laterality: Right;   TOE SURGERY Right    has pin and plate in it   TRANSURETHRAL RESECTION OF BLADDER TUMOR N/A 05/02/2022   Procedure: TRANSURETHRAL RESECTION OF BLADDER TUMOR (TURBT);  Surgeon: Hollice Espy, MD;  Location: [facility];  Service: Urology;  Laterality: N/A;   VIDEO BRONCHOSCOPY WITH ENDOBRONCHIAL ULTRASOUND N/A 03/02/2022   Procedure: VIDEO BRONCHOSCOPY WITH ENDOBRONCHIAL ULTRASOUND;  Surgeon: Tyler Pita, MD;  Location: [facility];  Service: Cardiopulmonary;  Laterality: N/A;    FAMILY  HISTORY: Family History  Problem Relation Age of Onset   Breast cancer Cousin 46       bilateral at 28 and 98; daughter of maternal aunt who was unaffected   Lung cancer Maternal Aunt        dx 40s; deceased 42s; smoker   Heart attack Father        deceased 13   Stomach cancer Maternal Grandmother     ADVANCED DIRECTIVES (Y/N):  N  HEALTH MAINTENANCE: Social History   Tobacco Use   Smoking status: Former    Packs/day: 1.00    Years: 30.00    Total pack years: 30.00    Types: Cigarettes    Quit date: 09/13/2015    Years since quitting: 6.7   Smokeless tobacco: Never  Vaping Use   Vaping Use: Never used  Substance Use Topics   Alcohol use: Yes    Comment: occasional   Drug use: No    Colonoscopy:  PAP:  Bone density:  Lipid panel:  Allergies  Allergen Reactions   Other     BANDAIDS-OF LEFT ON FOR AN EXTENDED PERIOD OF TIME  Patient states latex rubber gloves are ok    Current Outpatient Medications  Medication Sig Dispense Refill   omeprazole (PRILOSEC) 40 MG capsule Take 1 capsule (40 mg total) by mouth daily. 30 capsule 3   albuterol (VENTOLIN HFA) 108 (90 Base) MCG/ACT inhaler Inhale 1-2 puffs into the lungs every 6 (six) hours as needed for wheezing or shortness of breath. 1 g 1   alendronate (FOSAMAX) 70 MG tablet Take 1 tablet (70 mg total) by mouth once a week. Take with a full glass of water on an empty stomach. Stand or sit upright for 1 hour after taking (Patient taking differently: Take 70 mg by mouth once a week. Take with a full glass of water on an empty stomach. Stand or sit upright for 1 hour after taking WEDNESDAY) 12 tablet 1   ALPRAZolam (XANAX) 0.25 MG tablet Take 1 tablet (0.25 mg total) by mouth at bedtime as needed for anxiety or sleep. 30 tablet 0   anastrozole (ARIMIDEX) 1 MG tablet Take 1 tablet (1 mg total) by mouth at bedtime. 30 tablet 0   aspirin-acetaminophen-caffeine (EXCEDRIN MIGRAINE) 062-694-85 MG tablet Take by mouth every 6 (six)  hours as needed for headache.     fluticasone (FLONASE) 50 MCG/ACT nasal spray Place 2 sprays into both nostrils daily as needed for allergies. 60 mL 1   fluticasone-salmeterol (ADVAIR) 250-50 MCG/ACT AEPB Inhale 1 puff into the lungs in the morning and at bedtime. 60 each 5   folic acid (FOLVITE) 1 MG tablet Take 2 mg by mouth in the morning.     gabapentin (NEURONTIN) 300 MG capsule Take 1 capsule (300 mg total)  by mouth 2 (two) times daily. 60 capsule 6   HYDROcodone-acetaminophen (NORCO/VICODIN) 5-325 MG tablet Take 1-2 tablets by mouth every 6 (six) hours as needed for moderate pain. 10 tablet 0   methotrexate (RHEUMATREX) 2.5 MG tablet Take 20 mg by mouth once a week. WEDNESDAY     montelukast (SINGULAIR) 10 MG tablet Take 1 tablet (10 mg total) by mouth at bedtime. 90 tablet 1   oxybutynin (DITROPAN) 5 MG tablet Take 1 tablet (5 mg total) by mouth every 8 (eight) hours as needed for bladder spasms. 30 tablet 0   potassium chloride (KLOR-CON) 10 MEQ tablet TAKE (1) TABLET BY MOUTH EVERY DAY 90 tablet 1   rosuvastatin (CRESTOR) 40 MG tablet Take 40 mg by mouth every evening.     sertraline (ZOLOFT) 100 MG tablet TAKE (1) TABLET BY MOUTH EVERY DAY 90 tablet 1   traMADol (ULTRAM) 50 MG tablet TAKE (1) TABLET BY MOUTH THREE TIMES A DAY AS NEEDED     traMADol (ULTRAM) 50 MG tablet Take 1-2 tablets (50-100 mg total) by mouth 3 (three) times daily as needed. 60 tablet 0   No current facility-administered medications for this visit.    OBJECTIVE: Vitals:   06/07/22 0850  BP: 130/71  Pulse: 93  Resp: 16  Temp: (!) 97.2 F (36.2 C)  SpO2: 98%     Body mass index is 26.32 kg/m.    ECOG FS:0 - Asymptomatic  General: Well-developed, well-nourished, no acute distress. Fatigued appearing.  Eyes: Pink conjunctiva, anicteric sclera. HEENT: Normocephalic, moist mucous membranes. Lungs: No audible wheezing or coughing. Heart: Regular rate and rhythm. Abdomen: Soft, nontender, no obvious  distention. Musculoskeletal: No edema, cyanosis, or clubbing. Neuro: Alert, answering all questions appropriately. Cranial nerves grossly intact. Skin: No rashes or petechiae noted. Psych: Normal affect.   LAB RESULTS:  Lab Results  Component Value Date   NA 136 06/07/2022   K 4.0 06/07/2022   CL 105 06/07/2022   CO2 26 06/07/2022   GLUCOSE 117 (H) 06/07/2022   BUN 18 06/07/2022   CREATININE 0.76 06/07/2022   CALCIUM 8.4 (L) 06/07/2022   PROT 6.8 06/07/2022   ALBUMIN 3.7 06/07/2022   AST 19 06/07/2022   ALT 12 06/07/2022   ALKPHOS 59 06/07/2022   BILITOT 0.4 06/07/2022   GFRNONAA >60 06/07/2022   GFRAA >60 04/09/2020    Lab Results  Component Value Date   WBC 3.0 (L) 06/07/2022   NEUTROABS 1.7 06/07/2022   HGB 10.8 (L) 06/07/2022   HCT 33.5 (L) 06/07/2022   MCV 91.0 06/07/2022   PLT 177 06/07/2022     STUDIES: No results found.  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  PLAN:    1.  Stage IIIa squamous cell carcinoma of the right upper lung: Currently receiving cisplatin- taxotere q3w x 4 cycles with udenyca support. She is s/p cycle 3. Udenyca on hold d/t myalgias (see below). Pt is tolerately moderately to poorly. She questions continuing with cycle 4 d/t her side effects.   2. GCSF induced myalgias and arthralgias- udenyca held. Sx improved with correction of hypomagnesemia. Mag 1.5 today. Will give 4g of IV magnesium sulfate today. She can continue oral mag if tolerating/no diarrhea.   3. Chemotherapy induced nausea- she reports reflux and globus sensation. Clinically this sound smore suspicious of chemo nausea as opposed to acid reflux/esophagitis. No improvement with maximizing her otc PPI, H2 blockers, or calcium  supplements. Will give IVF, decadron 5 mg, and pepcid 20 mg IV in clinic today. 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 Will start zyprexa 10 mg at night for CIN and  she can take zofran 8 mg PO TID prn for daytime symptoms. Continue acid reflux medications as prescribed. We discussed rotating to protonix but hold for now to evaluate symptoms.   4. Hypomagnesemia- IV mag in clinic today (see above).   5. Hypocalcemia- likely in setting of hypo mag. Ca 8.4. Normal albumin. Calcium gluconate 1 g in clinic IV today.   6. DCIS of right breast- s/p lumpectomy and adjuvant xrt completed 05/2018. Unable to tolerate tamoxifen or letrozole. On anastrozole. Tolerating well. Plan to complete 5 years of treatment in September 2024. Mammogram was negative in June 2023. Defer surveillance to Dr. Grayland Ormond.   7. Taste changes- secondary to chemotherapy. Encouraged her to sample a variety of new flavors and foods. Offered nutrition/[facility] referral but on hold for now. If globus sensation improves, this may as well. Monitor weights.   8. Osteopenia- BMD 03/25/21 T score - 2.3. Worse. Continue calcium, vitamin d, and alendronate. Would recommend decreasing PPI when patient is off chemo to help with absorption of calcium in her diet.   9. Pain- secondary to surgery, chemotherapy- continue gabapentin. Continue tramadol for moderate pain and norco for severe pain.  Norco refilled today. PDMP reviewed.    Disposition  Ok for cycle 3 tomorrow of Cisplatin + Docetaxel RTC 1 week APP, labs (CBC W/, CMP, mag) +- K +- mag +- fluids RTC 3 weeks D1 MD, labs (CBC W/, CMP, Mag), D2 treatment D3 Udenyca -[place]  Encounter Diagnoses  Name Primary?   Squamous cell carcinoma of right lung (HCC) Yes   Chemotherapy-induced nausea    Bone pain due to G-CSF    Hypomagnesemia with secondary hypocalcemia    Cancer associated pain      Verlon Au, NP 06/07/2022

## 2022-06-13 ENCOUNTER — Other Ambulatory Visit: Payer: Self-pay | Admitting: Nurse Practitioner

## 2022-06-13 DIAGNOSIS — F411 Generalized anxiety disorder: Secondary | ICD-10-CM

## 2022-06-13 NOTE — Telephone Encounter (Signed)
Last 9/23 and next 11/23

## 2022-06-14 ENCOUNTER — Other Ambulatory Visit: Payer: Self-pay

## 2022-06-14 MED ORDER — FLUCONAZOLE 150 MG PO TABS: ORAL_TABLET | ORAL | 0 refills | Status: DC

## 2022-06-14 NOTE — Telephone Encounter (Signed)
Pt called that is having yeast infection as per dr Humphrey Rolls send diflucan

## 2022-06-15 ENCOUNTER — Encounter: Payer: Self-pay | Admitting: *Deleted

## 2022-06-15 DIAGNOSIS — D0511 Intraductal carcinoma in situ of right breast: Secondary | ICD-10-CM

## 2022-06-15 MED ORDER — ANASTROZOLE 1 MG PO TABS: 1.0000 mg | ORAL_TABLET | Freq: Every day | ORAL | 10 refills | Status: DC

## 2022-06-17 NOTE — Progress Notes (Signed)
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  INTERVAL HISTORY: Patient returns to clinic today for further evaluation and consideration of her fourth and final treatment of Taxotere and cisplatin.  She has noticed increased weakness and fatigue over the past several weeks.  She is also having symptoms of a UTI with burning and frequency.  She has no neurologic complaints.  She denies any recent fevers or illnesses. She has a good appetite and denies weight loss.  She denies any chest pain, shortness of breath, cough, or hemoptysis.  She denies any nausea, vomiting, constipation, or diarrhea.  Patient offers no further specific complaints today.  REVIEW OF SYSTEMS:   Review of Systems  Constitutional: Negative.  Negative for fever, malaise/fatigue and weight loss.  Respiratory: Negative.  Negative for cough, hemoptysis and shortness of breath.   Cardiovascular: Negative.  Negative for chest pain and leg swelling.  Gastrointestinal: Negative.  Negative for abdominal pain.  Genitourinary:  Positive for dysuria and frequency.  Musculoskeletal: Negative.  Negative for back pain and myalgias.  Skin: Negative.  Negative for rash.  Neurological: Negative.  Negative for dizziness, sensory change, focal weakness and weakness.  Psychiatric/Behavioral: Negative.  The patient is not nervous/anxious.     As per HPI. Otherwise, a complete review of systems is negative.  PAST MEDICAL HISTORY: Past Medical History:  Diagnosis Date   Adenoma of right adrenal gland 12/07/2021   a.) CT chest  12/07/2021; measured 2.3 cm; not FDG avid on 01/06/2022 PET CT.   Anxiety    a.) uses BZO (alprazolam) PRN   Aortic atherosclerosis (HCC)    Arthritis    Bladder mass 12/07/2021   a.) CT abd 12/07/2021 --> 9 x 8 mm nodular enhancing focus at the base of the bladder; suspicious for urothelial neoplasm.   COPD (chronic obstructive pulmonary disease) (HCC)    Coronary artery disease 10/16/2009   a.) LHC/PCI 10/16/2009: EF 60%; 50% OM1, 99% dRCA (3.0 x 18 mm Xience V DES). b.) LHC 07/31/2014: EF 65%; 40% pLAD, 20% pLCx, 40% OM1, 30% oRCA, 50% pRCA, 20% mRCA, 10% ISR stent to dRCA; further intervention deferred opting for med. mgmt.   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 b.) s/p lumpectomy, adjuvant XRT; currently on extended AI (anastrozole) therapy   Essential hypertension    GERD (gastroesophageal reflux disease)    HLD (hyperlipidemia)    Left thyroid nodule 12/07/2021   a.) CT chest; measured 2.6 cm   Long term current use of immunosuppressive drug    a.) MTX for RA.   OSA on CPAP    Osteopenia    Personal history of radiation therapy    Pneumonia    PRES (posterior reversible encephalopathy syndrome) 07/14/2014   Pulmonary nodules 11/08/2021   a.) CXR 11/08/2021: 101m nodular density RUL. b.) CT chest 12/07/2021: 1.9 x 1.4 x 2.5 spiculated lesion anterior RUL. c.) PET CT 01/06/2022: 2.2 x 1.9 spiculated anterior RUL mass (SUV max 16.4); d.)  Bx (+) NSCLC, favoring SCC with tumor necrosis; p40/CK7/GATA3 (+), TTF-1 (-)   Rheumatoid arthritis ([place])    a.) on MTX   Seasonal allergies    Seizures ([place]) 2014   a.) x 1 episode; etiology never determined.   Sinus headache    Squamous cell lung cancer, right ([place]) 03/17/2022   a.) RUL lobectomy 03/17/2022 --> Bx (+) for invasive moderately differentiated SCC of the RIGHT upper lobe (PDL-1: 5%) --> stage  IIIA SCC (pT3, pN1, cM0)   ST elevation myocardial infarction (STEMI) of inferior wall ([place]) 10/16/2009   a.) LHC/PCI 10/16/2009: EF 60%; 50% OM1, 99% dRCA (3.0 x 18 mm Xience V DES).   Tubular adenoma    Valvular heart disease     PAST SURGICAL HISTORY: Past Surgical History:  Procedure Laterality Date   ABDOMINAL HYSTERECTOMY     ANTERIOR CERVICAL DECOMP/DISCECTOMY FUSION N/A 01/20/2014   Procedure: ANTERIOR CERVICAL DECOMPRESSION/DISCECTOMY FUSION 2 LEVELS cervical five/six six [place];  Surgeon: Ophelia Charter, MD;  Location: [facility];  Service: Neurosurgery;  Laterality: N/A;   APPENDECTOMY     BACK SURGERY     lumbar   BLADDER INSTILLATION N/A 05/02/2022   Procedure: BLADDER INSTILLATION OF GEMCITABINE;  Surgeon: Hollice Espy, MD;  Location: [facility];  Service: Urology;  Laterality: N/A;   BREAST BIOPSY Right 02/01/2018   x shape, DCIS    BREAST BIOPSY  01/16/2019   coil, BENIGN MAMMARY TISSUE WITH MODERATE STROMAL FIBROSIS AND COARSE DYSTROPHIC CALCIFICATIONS of the RIGHT breast   BREAST LUMPECTOMY Right 03/07/2018   Procedure: BREAST LUMPECTOMY/ RE EXCISION;  Surgeon: Herbert Pun, MD;  Location: [facility];  Service: General;  Laterality: Right;   CARDIAC CATHETERIZATION Left 07/31/2014   Procedure: CARDIAC CATHETERIZATION; Location: [facility]; Surgeon: Serafina Royals, MD   COLONOSCOPY WITH PROPOFOL N/A 02/11/2021   Procedure: COLONOSCOPY WITH PROPOFOL;  Surgeon: Lucilla Lame, MD;  Location: [facility];  Service: Endoscopy;  Laterality: N/A;  Requests Early   CORONARY ANGIOPLASTY WITH STENT PLACEMENT Left 10/16/2009   Procedure: CORONARY ANGIOPLASTY WITH STENT PLACEMENT; Location: [facility]; Surgeon: Katrine Coho, MD   CYSTOSCOPY W/ RETROGRADES Bilateral 05/02/2022   Procedure: CYSTOSCOPY WITH RETROGRADE PYELOGRAM;  Surgeon: Hollice Espy, MD;  Location: [facility];  Service: Urology;  Laterality: Bilateral;   INTERCOSTAL NERVE BLOCK Right 03/17/2022    Procedure: INTERCOSTAL NERVE BLOCK;  Surgeon: Melrose Nakayama, MD;  Location: [facility];  Service: Thoracic;  Laterality: Right;   LASIK Bilateral    LUNG LOBECTOMY Right 03/17/2022   right upper lobe   LYMPH NODE DISSECTION Right 03/17/2022   Procedure: LYMPH NODE DISSECTION;  Surgeon: Melrose Nakayama, MD;  Location: [facility];  Service: Thoracic;  Laterality: Right;   PARTIAL MASTECTOMY WITH NEEDLE LOCALIZATION Right 02/21/2018   Procedure: PARTIAL MASTECTOMY WITH NEEDLE LOCALIZATION;  Surgeon: Herbert Pun, MD;  Location: [facility];  Service: General;  Laterality: Right;   POLYPECTOMY N/A 02/11/2021   Procedure: POLYPECTOMY;  Surgeon: Lucilla Lame, MD;  Location: [facility];  Service: Endoscopy;  Laterality: N/A;   ROTATOR CUFF REPAIR Left    SHOULDER ARTHROSCOPY WITH ROTATOR CUFF REPAIR AND OPEN BICEPS TENODESIS Right 08/07/2019   Procedure: SHOULDER ARTHROSCOPY WITH DEBRIDEMENT, DECOMPRESSION, ROTATOR CUFF REPAIR AND BICEPS TENODESIS. - RNFA;  Surgeon: Corky Mull, MD;  Location: [facility];  Service: Orthopedics;  Laterality: Right;   TOE SURGERY Right    has pin and plate in it   TRANSURETHRAL RESECTION OF BLADDER TUMOR N/A 05/02/2022  Procedure: TRANSURETHRAL RESECTION OF BLADDER TUMOR (TURBT);  Surgeon: Hollice Espy, MD;  Location: [facility];  Service: Urology;  Laterality: N/A;   VIDEO BRONCHOSCOPY WITH ENDOBRONCHIAL ULTRASOUND N/A 03/02/2022   Procedure: VIDEO BRONCHOSCOPY WITH ENDOBRONCHIAL ULTRASOUND;  Surgeon: Tyler Pita, MD;  Location: [facility];  Service: Cardiopulmonary;  Laterality: N/A;    FAMILY HISTORY: Family History  Problem Relation Age of Onset   Breast cancer Cousin 80       bilateral at 69 and 20; daughter of maternal aunt who was unaffected   Lung cancer Maternal Aunt        dx 72s; deceased 55s; smoker   Heart attack Father        deceased 29   Stomach cancer Maternal Grandmother     ADVANCED DIRECTIVES (Y/N):  N  HEALTH  MAINTENANCE: Social History   Tobacco Use   Smoking status: Former    Packs/day: 1.00    Years: 30.00    Total pack years: 30.00    Types: Cigarettes    Quit date: 09/13/2015    Years since quitting: 6.7   Smokeless tobacco: Never  Vaping Use   Vaping Use: Never used  Substance Use Topics   Alcohol use: Yes    Comment: occasional   Drug use: No     Colonoscopy:  PAP:  Bone density:  Lipid panel:  Allergies  Allergen Reactions   Other     BANDAIDS-OF LEFT ON FOR AN EXTENDED PERIOD OF TIME  Patient states latex rubber gloves are ok    Current Outpatient Medications  Medication Sig Dispense Refill   albuterol (VENTOLIN HFA) 108 (90 Base) MCG/ACT inhaler Inhale 1-2 puffs into the lungs every 6 (six) hours as needed for wheezing or shortness of breath. 1 g 1   alendronate (FOSAMAX) 70 MG tablet Take 1 tablet (70 mg total) by mouth once a week. Take with a full glass of water on an empty stomach. Stand or sit upright for 1 hour after taking (Patient taking differently: Take 70 mg by mouth once a week. Take with a full glass of water on an empty stomach. Stand or sit upright for 1 hour after taking WEDNESDAY) 12 tablet 1   ALPRAZolam (XANAX) 0.25 MG tablet TAKE (1) TABLET BY MOUTH DAILY AT BEDTIME AS NEEDED FOR ANXIETY 30 tablet 1   anastrozole (ARIMIDEX) 1 MG tablet Take 1 tablet (1 mg total) by mouth at bedtime. 30 tablet 10   aspirin-acetaminophen-caffeine (EXCEDRIN MIGRAINE) 932-355-73 MG tablet Take by mouth every 6 (six) hours as needed for headache.     fluconazole (DIFLUCAN) 150 MG tablet Take 1 tab po once and may repeat in 3 days if symptoms persist 3 tablet 0   fluticasone (FLONASE) 50 MCG/ACT nasal spray Place 2 sprays into both nostrils daily as needed for allergies. 60 mL 1   fluticasone-salmeterol (ADVAIR) 250-50 MCG/ACT AEPB Inhale 1 puff into the lungs in the morning and at bedtime. 60 each 5   folic acid (FOLVITE) 1 MG tablet Take 2 mg by mouth in the morning.      gabapentin (NEURONTIN) 300 MG capsule Take 1 capsule (300 mg total) by mouth 2 (two) times daily. 60 capsule 6   HYDROcodone-acetaminophen (NORCO/VICODIN) 5-325 MG tablet Take 1-2 tablets by mouth every 6 (six) hours as needed for moderate pain. 10 tablet 0   methotrexate (RHEUMATREX) 2.5 MG tablet Take 20 mg by mouth once a week. WEDNESDAY     montelukast (SINGULAIR) 10 MG tablet  Take 1 tablet (10 mg total) by mouth at bedtime. 90 tablet 1   OLANZapine (ZYPREXA) 10 MG tablet Take 1 tablet (10 mg total) by mouth at bedtime. For nausea 30 tablet 0   omeprazole (PRILOSEC) 40 MG capsule TAKE (1) CAPSULE BY MOUTH EVERY DAY 90 capsule 3   ondansetron (ZOFRAN) 8 MG tablet Take 1 tablet (8 mg total) by mouth every 8 (eight) hours as needed for nausea or vomiting. 60 tablet 0   oxybutynin (DITROPAN) 5 MG tablet Take 1 tablet (5 mg total) by mouth every 8 (eight) hours as needed for bladder spasms. 30 tablet 0   potassium chloride (KLOR-CON) 10 MEQ tablet TAKE (1) TABLET BY MOUTH EVERY DAY 90 tablet 1   rosuvastatin (CRESTOR) 40 MG tablet Take 40 mg by mouth every evening.     sertraline (ZOLOFT) 100 MG tablet TAKE (1) TABLET BY MOUTH EVERY DAY 90 tablet 1   sulfamethoxazole-trimethoprim (BACTRIM DS) 800-160 MG tablet Take 1 tablet by mouth 2 (two) times daily. 10 tablet 0   traMADol (ULTRAM) 50 MG tablet TAKE (1) TABLET BY MOUTH THREE TIMES A DAY AS NEEDED     traMADol (ULTRAM) 50 MG tablet Take 1-2 tablets (50-100 mg total) by mouth 3 (three) times daily as needed. 60 tablet 0   No current facility-administered medications for this visit.    OBJECTIVE: Vitals:   06/21/22 0852  BP: (!) 113/57  Pulse: 89  Temp: 98.9 F (37.2 C)     Body mass index is 27.73 kg/m.    ECOG FS:0 - Asymptomatic  General: Well-developed, well-nourished, no acute distress. Eyes: Pink conjunctiva, anicteric sclera. HEENT: Normocephalic, moist mucous membranes. Lungs: No audible wheezing or coughing. Heart: Regular  rate and rhythm. Abdomen: Soft, nontender, no obvious distention. Musculoskeletal: No edema, cyanosis, or clubbing. Neuro: Alert, answering all questions appropriately. Cranial nerves grossly intact. Skin: No rashes or petechiae noted. Psych: Normal affect.   LAB RESULTS:  Lab Results  Component Value Date   NA 140 06/21/2022   K 4.3 06/21/2022   CL 107 06/21/2022   CO2 28 06/21/2022   GLUCOSE 108 (H) 06/21/2022   BUN 12 06/21/2022   CREATININE 0.66 06/21/2022   CALCIUM 8.7 (L) 06/21/2022   PROT 6.1 (L) 06/21/2022   ALBUMIN 3.2 (L) 06/21/2022   AST 16 06/21/2022   ALT 10 06/21/2022   ALKPHOS 55 06/21/2022   BILITOT 0.3 06/21/2022   GFRNONAA >60 06/21/2022   GFRAA >60 04/09/2020    Lab Results  Component Value Date   WBC 5.8 06/21/2022   NEUTROABS 3.7 06/21/2022   HGB 9.5 (L) 06/21/2022   HCT 30.2 (L) 06/21/2022   MCV 94.7 06/21/2022   PLT 186 06/21/2022     STUDIES: No results found.  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  PLAN:    1.  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  She was also noted to have 1 of 33 lymph nodes positive for disease.  This increased her to a stage IIIa.  Have recommended adjuvant chemotherapy using cisplatin plus Taxotere every 3 weeks for 4 cycles. MRI of the brain on April 21, 2022 reviewed independently with no obvious evidence of metastatic disease.  Patient only received Udenyca with cycle  1 secondary to side effects.  Proceed with cycle 4 of treatment tomorrow.  Return to clinic Friday for IV fluids and IV magnesium.  Patient will then return to clinic in 3 weeks for further evaluation.   2. DCIS, right breast: Patient underwent lumpectomy followed by adjuvant XRT completing in September 2019.   Because there was no invasive component on her pathology, she did not require adjuvant chemotherapy. Patient could not tolerate tamoxifen or letrozole secondary to worsening joint pain, therefore was switched to anastrozole.  She is tolerating this well and will complete 5 years of treatment in September 2024.  Her most recent mammogram on March 11, 2022 was reported BI-RADS 1.  Repeat in June 2024. 3.  Osteopenia: Patient's most recent bone mineral density on March 25, 2021 revealed a T score of -2.3 which is slightly worse than previous where her T score was reported -1.9 and -1.4 and years prior.  Continue calcium, vitamin D, and alendronate.    4.  Possible UTI: Urine only with trace leukocytes.  Culture is pending at time of dictation.  Patient was prophylactically given a prescription for Bactrim x5 days.    Lloyd Huger, MD 06/21/2022 5:19 PM   Cancer Staging  Ductal carcinoma in situ (DCIS) of right breast Staging form: Breast, AJCC 8th Edition - Clinical: Stage 0 (cTis (DCIS), cN0, cM0, ER+, PR-, HER2-) - Signed by Lloyd Huger, MD on 03/18/2018 Nuclear grade: G3 Laterality: Right  Squamous cell carcinoma of right lung ([place]) Staging form: Lung, AJCC 8th Edition - Pathologic stage from 04/07/2022: Stage IIIA (pT3, pN1, cM0) - Signed by Lloyd Huger, MD on 04/07/2022

## 2022-06-20 ENCOUNTER — Other Ambulatory Visit: Payer: Self-pay | Admitting: Physician Assistant

## 2022-06-21 ENCOUNTER — Encounter: Payer: Self-pay | Admitting: Oncology

## 2022-06-21 ENCOUNTER — Inpatient Hospital Stay (HOSPITAL_BASED_OUTPATIENT_CLINIC_OR_DEPARTMENT_OTHER): Payer: Medicare HMO | Admitting: Oncology

## 2022-06-21 ENCOUNTER — Inpatient Hospital Stay: Payer: Medicare HMO | Attending: Oncology

## 2022-06-21 VITALS — BP 113/57 | HR 89 | Temp 98.9°F | Wt 142.0 lb

## 2022-06-21 DIAGNOSIS — Z9221 Personal history of antineoplastic chemotherapy: Secondary | ICD-10-CM | POA: Insufficient documentation

## 2022-06-21 DIAGNOSIS — C3491 Malignant neoplasm of unspecified part of right bronchus or lung: Secondary | ICD-10-CM | POA: Diagnosis not present

## 2022-06-21 DIAGNOSIS — Z79899 Other long term (current) drug therapy: Secondary | ICD-10-CM | POA: Diagnosis not present

## 2022-06-21 DIAGNOSIS — C3411 Malignant neoplasm of upper lobe, right bronchus or lung: Secondary | ICD-10-CM | POA: Diagnosis not present

## 2022-06-21 DIAGNOSIS — D0511 Intraductal carcinoma in situ of right breast: Secondary | ICD-10-CM | POA: Insufficient documentation

## 2022-06-21 DIAGNOSIS — K219 Gastro-esophageal reflux disease without esophagitis: Secondary | ICD-10-CM | POA: Insufficient documentation

## 2022-06-21 DIAGNOSIS — Z902 Acquired absence of lung [part of]: Secondary | ICD-10-CM | POA: Diagnosis not present

## 2022-06-21 DIAGNOSIS — M8580 Other specified disorders of bone density and structure, unspecified site: Secondary | ICD-10-CM | POA: Insufficient documentation

## 2022-06-21 DIAGNOSIS — R300 Dysuria: Secondary | ICD-10-CM | POA: Diagnosis not present

## 2022-06-21 DIAGNOSIS — Z87891 Personal history of nicotine dependence: Secondary | ICD-10-CM | POA: Insufficient documentation

## 2022-06-21 DIAGNOSIS — Z79811 Long term (current) use of aromatase inhibitors: Secondary | ICD-10-CM | POA: Insufficient documentation

## 2022-06-21 DIAGNOSIS — Z5111 Encounter for antineoplastic chemotherapy: Secondary | ICD-10-CM | POA: Insufficient documentation

## 2022-06-21 LAB — CBC WITH DIFFERENTIAL/PLATELET
Abs Immature Granulocytes: 0.03 10*3/uL (ref 0.00–0.07)
Basophils Absolute: 0.1 10*3/uL (ref 0.0–0.1)
Basophils Relative: 1 %
Eosinophils Absolute: 0 10*3/uL (ref 0.0–0.5)
Eosinophils Relative: 0 %
HCT: 30.2 % — ABNORMAL LOW (ref 36.0–46.0)
Hemoglobin: 9.5 g/dL — ABNORMAL LOW (ref 12.0–15.0)
Immature Granulocytes: 1 %
Lymphocytes Relative: 25 %
Lymphs Abs: 1.4 10*3/uL (ref 0.7–4.0)
MCH: 29.8 pg (ref 26.0–34.0)
MCHC: 31.5 g/dL (ref 30.0–36.0)
MCV: 94.7 fL (ref 80.0–100.0)
Monocytes Absolute: 0.6 10*3/uL (ref 0.1–1.0)
Monocytes Relative: 10 %
Neutro Abs: 3.7 10*3/uL (ref 1.7–7.7)
Neutrophils Relative %: 63 %
Platelets: 186 10*3/uL (ref 150–400)
RBC: 3.19 MIL/uL — ABNORMAL LOW (ref 3.87–5.11)
RDW: 19.9 % — ABNORMAL HIGH (ref 11.5–15.5)
WBC: 5.8 10*3/uL (ref 4.0–10.5)
nRBC: 0 % (ref 0.0–0.2)

## 2022-06-21 LAB — COMPREHENSIVE METABOLIC PANEL
ALT: 10 U/L (ref 0–44)
AST: 16 U/L (ref 15–41)
Albumin: 3.2 g/dL — ABNORMAL LOW (ref 3.5–5.0)
Alkaline Phosphatase: 55 U/L (ref 38–126)
Anion gap: 5 (ref 5–15)
BUN: 12 mg/dL (ref 8–23)
CO2: 28 mmol/L (ref 22–32)
Calcium: 8.7 mg/dL — ABNORMAL LOW (ref 8.9–10.3)
Chloride: 107 mmol/L (ref 98–111)
Creatinine, Ser: 0.66 mg/dL (ref 0.44–1.00)
GFR, Estimated: >60 mL/min (ref >60)
Glucose, Bld: 108 mg/dL — ABNORMAL HIGH (ref 70–99)
Potassium: 4.3 mmol/L (ref 3.5–5.1)
Sodium: 140 mmol/L (ref 135–145)
Total Bilirubin: 0.3 mg/dL (ref 0.3–1.2)
Total Protein: 6.1 g/dL — ABNORMAL LOW (ref 6.5–8.1)

## 2022-06-21 LAB — URINALYSIS, COMPLETE (UACMP) WITH MICROSCOPIC
Bacteria, UA: NONE SEEN
Bilirubin Urine: NEGATIVE
Glucose, UA: NEGATIVE mg/dL
Hgb urine dipstick: NEGATIVE
Ketones, ur: NEGATIVE mg/dL
Nitrite: NEGATIVE
Protein, ur: NEGATIVE mg/dL
Specific Gravity, Urine: 1.013 (ref 1.005–1.030)
pH: 5 (ref 5.0–8.0)

## 2022-06-21 LAB — MAGNESIUM: Magnesium: 1.8 mg/dL (ref 1.7–2.4)

## 2022-06-21 MED ORDER — SULFAMETHOXAZOLE-TRIMETHOPRIM 800-160 MG PO TABS: 1.0000 | ORAL_TABLET | Freq: Two times a day (BID) | ORAL | 0 refills | Status: DC

## 2022-06-21 MED FILL — Dexamethasone Sodium Phosphate Inj 100 MG/10ML: INTRAMUSCULAR | Qty: 1 | Status: AC

## 2022-06-21 MED FILL — Fosaprepitant Dimeglumine For IV Infusion 150 MG (Base Eq): INTRAVENOUS | Qty: 5 | Status: AC

## 2022-06-22 ENCOUNTER — Other Ambulatory Visit: Payer: Medicare HMO

## 2022-06-22 ENCOUNTER — Inpatient Hospital Stay: Payer: Medicare HMO

## 2022-06-22 ENCOUNTER — Ambulatory Visit: Payer: Medicare HMO | Admitting: Oncology

## 2022-06-22 VITALS — BP 116/66 | HR 80 | Temp 98.0°F | Resp 16

## 2022-06-22 DIAGNOSIS — M8580 Other specified disorders of bone density and structure, unspecified site: Secondary | ICD-10-CM | POA: Diagnosis not present

## 2022-06-22 DIAGNOSIS — K219 Gastro-esophageal reflux disease without esophagitis: Secondary | ICD-10-CM | POA: Diagnosis not present

## 2022-06-22 DIAGNOSIS — R300 Dysuria: Secondary | ICD-10-CM | POA: Diagnosis not present

## 2022-06-22 DIAGNOSIS — Z79899 Other long term (current) drug therapy: Secondary | ICD-10-CM | POA: Diagnosis not present

## 2022-06-22 DIAGNOSIS — D0511 Intraductal carcinoma in situ of right breast: Secondary | ICD-10-CM | POA: Diagnosis not present

## 2022-06-22 DIAGNOSIS — Z79811 Long term (current) use of aromatase inhibitors: Secondary | ICD-10-CM | POA: Diagnosis not present

## 2022-06-22 DIAGNOSIS — Z5111 Encounter for antineoplastic chemotherapy: Secondary | ICD-10-CM | POA: Diagnosis not present

## 2022-06-22 DIAGNOSIS — C3411 Malignant neoplasm of upper lobe, right bronchus or lung: Secondary | ICD-10-CM | POA: Diagnosis not present

## 2022-06-22 DIAGNOSIS — C3491 Malignant neoplasm of unspecified part of right bronchus or lung: Secondary | ICD-10-CM

## 2022-06-22 MED ORDER — POTASSIUM CHLORIDE IN NACL 20-0.9 MEQ/L-% IV SOLN
Freq: Once | INTRAVENOUS | Status: AC
  Filled 2022-06-22: qty 1000

## 2022-06-22 MED ORDER — PALONOSETRON HCL INJECTION 0.25 MG/5ML
0.2500 mg | Freq: Once | INTRAVENOUS | Status: AC
  Administered 2022-06-22: 0.25 mg via INTRAVENOUS
  Filled 2022-06-22: qty 5

## 2022-06-22 MED ORDER — MAGNESIUM SULFATE 2 GM/50ML IV SOLN
2.0000 g | Freq: Once | INTRAVENOUS | Status: AC
  Administered 2022-06-22: 2 g via INTRAVENOUS
  Filled 2022-06-22: qty 50

## 2022-06-22 MED ORDER — SODIUM CHLORIDE 0.9 % IV SOLN
10.0000 mg | Freq: Once | INTRAVENOUS | Status: AC
  Administered 2022-06-22: 10 mg via INTRAVENOUS
  Filled 2022-06-22: qty 10

## 2022-06-22 MED ORDER — SODIUM CHLORIDE 0.9 % IV SOLN
75.0000 mg/m2 | Freq: Once | INTRAVENOUS | Status: AC
  Administered 2022-06-22: 120 mg via INTRAVENOUS
  Filled 2022-06-22: qty 12

## 2022-06-22 MED ORDER — SODIUM CHLORIDE 0.9 % IV SOLN
150.0000 mg | Freq: Once | INTRAVENOUS | Status: AC
  Administered 2022-06-22: 150 mg via INTRAVENOUS
  Filled 2022-06-22: qty 150

## 2022-06-22 MED ORDER — SODIUM CHLORIDE 0.9 % IV SOLN
Freq: Once | INTRAVENOUS | Status: AC
  Filled 2022-06-22: qty 250

## 2022-06-22 MED ORDER — SODIUM CHLORIDE 0.9 % IV SOLN
75.0000 mg/m2 | Freq: Once | INTRAVENOUS | Status: AC
  Administered 2022-06-22: 122 mg via INTRAVENOUS
  Filled 2022-06-22: qty 122

## 2022-06-22 NOTE — Patient Instructions (Signed)
[facility] CTR AT [place]  Discharge Instructions: Thank you for choosing [facility] to provide your oncology and hematology care.  If you have a lab appointment with the [facility], please go directly to the [facility] and check in at the registration area.  Wear comfortable clothing and clothing appropriate for easy access to any Portacath or PICC line.   We strive to give you quality time with your provider. You may need to reschedule your appointment if you arrive late (15 or more minutes).  Arriving late affects you and other patients whose appointments are after yours.  Also, if you miss three or more appointments without notifying the office, you may be dismissed from the clinic at the provider's discretion.      For prescription refill requests, have your pharmacy contact our office and allow 72 hours for refills to be completed.    Today you received the following chemotherapy and/or immunotherapy agents DOCEtaxel and CISplatin.      To help prevent nausea and vomiting after your treatment, we encourage you to take your nausea medication as directed.  BELOW ARE SYMPTOMS THAT SHOULD BE REPORTED IMMEDIATELY: *FEVER GREATER THAN 100.4 F (38 C) OR HIGHER *CHILLS OR SWEATING *NAUSEA AND VOMITING THAT IS NOT CONTROLLED WITH YOUR NAUSEA MEDICATION *UNUSUAL SHORTNESS OF BREATH *UNUSUAL BRUISING OR BLEEDING *URINARY PROBLEMS (pain or burning when urinating, or frequent urination) *BOWEL PROBLEMS (unusual diarrhea, constipation, pain near the anus) TENDERNESS IN MOUTH AND THROAT WITH OR WITHOUT PRESENCE OF ULCERS (sore throat, sores in mouth, or a toothache) UNUSUAL RASH, SWELLING OR PAIN  UNUSUAL VAGINAL DISCHARGE OR ITCHING   Items with * indicate a potential emergency and should be followed up as soon as possible or go to the Emergency Department if any problems should occur.  Please show the CHEMOTHERAPY ALERT CARD or IMMUNOTHERAPY ALERT CARD  at check-in to the Emergency Department and triage nurse.  Should you have questions after your visit or need to cancel or reschedule your appointment, please contact [facility] CANCER [place] AT [place]  [phone] and follow the prompts.  Office hours are 8:00 a.m. to 4:30 p.m. Monday - Friday. Please note that voicemails left after 4:00 p.m. may not be returned until the following business day.  We are closed weekends and major holidays. You have access to a nurse at all times for urgent questions. Please call the main number to the clinic [phone] and follow the prompts.  For any non-urgent questions, you may also contact your provider using MyChart. We now offer e-Visits for anyone 83 and older to request care online for non-urgent symptoms. For details visit [email or web address].   Also download the MyChart app! Go to the app store, search "MyChart", open the app, select [facility], and log in with your MyChart username and password.  Masks are optional in the cancer centers. If you would like for your care team to wear a mask while they are taking care of you, please let them know. For doctor visits, patients may have with them one support person who is at least 64 years old. At this time, visitors are not allowed in the infusion area.

## 2022-06-22 NOTE — Progress Notes (Signed)
Pt had went for a walk down the hall in the infusion clinic. When she returned she looked pale and lips were grey in color. She was also off balanced. She didn't feel dizzy or lightheaded. VS were checked and elevated from prior. Sequence of events below:  1320:Pt. Returned from walk. Notice the above. DOCetaxel (Taxotere) was running then paused. O2 77%, 3L placed on pt.  1322: L. Zenia Resides, [place] notified. 1324: BP 155/70, HR 98, O2 91% on 2L., Alease Medina, PA at chairside.  1325: Chemo medication restarted.  Alle, PA stated to titrate O2 and to continue chemo treatment.   1333: VS rechecked 116/66, HR 80, 96% on 1L. Pt is no longer pale looking and lips are pink in color.

## 2022-06-22 NOTE — Progress Notes (Signed)
Nutrition Follow-up:   Patient stage III SCC right lung cancer.  S/p right upper lobectomy.  Patient receiving last chemotherapy treatment today.   Met with patient during infusion.  Reports reflux has improved greatly. Fatigue still a concern but she is still eating at least three times a day.  Breakfast usually oatmeal and cereal. Enjoyed a chili, cheese baked potato at [facility] yesterday and tasted good., Steak and salad last night. Taste changes continue, not tolerating sweets currently. She tried CIB and [place] samples and didn't care for them either.  She does have a blender at home and is willing to consider making her own protein shakes.  Bowels only moving twice weekly.  She is using water flavoring packs to increase fluids and electrolytes from IV Nutrition.      Medications: reviewed   Labs: reviewed 06/21/22    Anthropometrics: weight increased 2# since last seen   Weight  06/21/22  142# 05/10/22  140 lb 04/20/22  141 lb      NUTRITION DIAGNOSIS: Inadequate oral intake stable     INTERVENTION:  Shake recipes given.  Tips for managing fatigue provided. Contact information provided      MONITORING, EVALUATION, GOAL: weight trends, intake     NEXT VISIT: remote follow up next month  April Manson, RDN, LDN Registered Dietitian, [place] Part Time Remote (Usual office hours: Tuesday-Thursday) Mobile: [phone] Remote Office: [phone]

## 2022-06-23 ENCOUNTER — Ambulatory Visit: Payer: Medicare HMO

## 2022-06-23 ENCOUNTER — Encounter: Payer: Self-pay | Admitting: Pulmonary Disease

## 2022-06-23 NOTE — Telephone Encounter (Signed)
Dr. Patsey Berthold, please advise if okay to refill albuterol solution solution. Not prescribed by our office previously

## 2022-06-24 ENCOUNTER — Inpatient Hospital Stay: Payer: Medicare HMO

## 2022-06-24 ENCOUNTER — Ambulatory Visit: Payer: Medicare HMO

## 2022-06-24 VITALS — BP 143/79 | HR 89 | Temp 97.8°F

## 2022-06-24 DIAGNOSIS — Z5111 Encounter for antineoplastic chemotherapy: Secondary | ICD-10-CM | POA: Diagnosis not present

## 2022-06-24 DIAGNOSIS — K219 Gastro-esophageal reflux disease without esophagitis: Secondary | ICD-10-CM | POA: Diagnosis not present

## 2022-06-24 DIAGNOSIS — Z79899 Other long term (current) drug therapy: Secondary | ICD-10-CM | POA: Diagnosis not present

## 2022-06-24 DIAGNOSIS — C3491 Malignant neoplasm of unspecified part of right bronchus or lung: Secondary | ICD-10-CM

## 2022-06-24 DIAGNOSIS — R300 Dysuria: Secondary | ICD-10-CM | POA: Diagnosis not present

## 2022-06-24 DIAGNOSIS — Z79811 Long term (current) use of aromatase inhibitors: Secondary | ICD-10-CM | POA: Diagnosis not present

## 2022-06-24 DIAGNOSIS — D0511 Intraductal carcinoma in situ of right breast: Secondary | ICD-10-CM | POA: Diagnosis not present

## 2022-06-24 DIAGNOSIS — M8580 Other specified disorders of bone density and structure, unspecified site: Secondary | ICD-10-CM | POA: Diagnosis not present

## 2022-06-24 DIAGNOSIS — C3411 Malignant neoplasm of upper lobe, right bronchus or lung: Secondary | ICD-10-CM | POA: Diagnosis not present

## 2022-06-24 MED ORDER — SODIUM CHLORIDE 0.9 % IV SOLN
Freq: Once | INTRAVENOUS | Status: AC
  Filled 2022-06-24: qty 250

## 2022-06-24 MED ORDER — PEGFILGRASTIM-CBQV 6 MG/0.6ML ~~LOC~~ SOSY
6.0000 mg | PREFILLED_SYRINGE | Freq: Once | SUBCUTANEOUS | Status: DC
  Filled 2022-06-24: qty 0.6

## 2022-06-24 MED ORDER — ALBUTEROL SULFATE (2.5 MG/3ML) 0.083% IN NEBU: 2.5000 mg | INHALATION_SOLUTION | Freq: Four times a day (QID) | RESPIRATORY_TRACT | 2 refills | Status: DC | PRN

## 2022-06-24 NOTE — Telephone Encounter (Signed)
Okay to send in a prescription for albuterol for nebulizer use standard dose.  1 vial every 6h as needed shortness of breath/wheezing.

## 2022-06-24 NOTE — Progress Notes (Signed)
Pt was down for udenyca injection and IVF's. Pt states she is not taking the udenyca injection. She only had it with 1st cycle and experienced a lot of SE's.Pt complaining of burning discomfort at IV site L AC. Removed IV catheter. Approximately 700 ml NS infused. Pt stated she did not want another IV stick. She was ready to go home. Discharged pt to home.

## 2022-06-28 ENCOUNTER — Ambulatory Visit: Payer: Medicare HMO | Admitting: Oncology

## 2022-06-28 ENCOUNTER — Other Ambulatory Visit: Payer: Medicare HMO

## 2022-06-29 ENCOUNTER — Ambulatory Visit: Payer: Medicare HMO

## 2022-06-29 ENCOUNTER — Encounter: Payer: Self-pay | Admitting: Physician Assistant

## 2022-06-29 ENCOUNTER — Encounter: Payer: Self-pay | Admitting: Oncology

## 2022-06-30 ENCOUNTER — Other Ambulatory Visit: Payer: Self-pay

## 2022-06-30 ENCOUNTER — Inpatient Hospital Stay: Payer: Medicare HMO

## 2022-06-30 ENCOUNTER — Inpatient Hospital Stay (HOSPITAL_BASED_OUTPATIENT_CLINIC_OR_DEPARTMENT_OTHER): Payer: Medicare HMO | Admitting: Hospice and Palliative Medicine

## 2022-06-30 ENCOUNTER — Encounter: Payer: Self-pay | Admitting: Oncology

## 2022-06-30 ENCOUNTER — Encounter: Payer: Self-pay | Admitting: Hospice and Palliative Medicine

## 2022-06-30 ENCOUNTER — Telehealth: Payer: Self-pay

## 2022-06-30 VITALS — BP 130/72 | HR 102 | Temp 98.5°F | Resp 18 | Wt 136.6 lb

## 2022-06-30 DIAGNOSIS — C3491 Malignant neoplasm of unspecified part of right bronchus or lung: Secondary | ICD-10-CM

## 2022-06-30 DIAGNOSIS — R300 Dysuria: Secondary | ICD-10-CM | POA: Diagnosis not present

## 2022-06-30 DIAGNOSIS — Z5111 Encounter for antineoplastic chemotherapy: Secondary | ICD-10-CM | POA: Diagnosis not present

## 2022-06-30 DIAGNOSIS — D0511 Intraductal carcinoma in situ of right breast: Secondary | ICD-10-CM | POA: Diagnosis not present

## 2022-06-30 DIAGNOSIS — K219 Gastro-esophageal reflux disease without esophagitis: Secondary | ICD-10-CM | POA: Diagnosis not present

## 2022-06-30 DIAGNOSIS — Z79811 Long term (current) use of aromatase inhibitors: Secondary | ICD-10-CM | POA: Diagnosis not present

## 2022-06-30 DIAGNOSIS — Z79899 Other long term (current) drug therapy: Secondary | ICD-10-CM | POA: Diagnosis not present

## 2022-06-30 DIAGNOSIS — M8580 Other specified disorders of bone density and structure, unspecified site: Secondary | ICD-10-CM | POA: Diagnosis not present

## 2022-06-30 DIAGNOSIS — C3411 Malignant neoplasm of upper lobe, right bronchus or lung: Secondary | ICD-10-CM | POA: Diagnosis not present

## 2022-06-30 LAB — CBC
HCT: 28.9 % — ABNORMAL LOW (ref 36.0–46.0)
Hemoglobin: 9.1 g/dL — ABNORMAL LOW (ref 12.0–15.0)
MCH: 30.3 pg (ref 26.0–34.0)
MCHC: 31.5 g/dL (ref 30.0–36.0)
MCV: 96.3 fL (ref 80.0–100.0)
Platelets: 130 10*3/uL — ABNORMAL LOW (ref 150–400)
RBC: 3 MIL/uL — ABNORMAL LOW (ref 3.87–5.11)
RDW: 19.8 % — ABNORMAL HIGH (ref 11.5–15.5)
WBC: 1.6 10*3/uL — ABNORMAL LOW (ref 4.0–10.5)
nRBC: 0 % (ref 0.0–0.2)

## 2022-06-30 LAB — COMPREHENSIVE METABOLIC PANEL
ALT: 12 U/L (ref 0–44)
AST: 20 U/L (ref 15–41)
Albumin: 3.5 g/dL (ref 3.5–5.0)
Alkaline Phosphatase: 55 U/L (ref 38–126)
Anion gap: 8 (ref 5–15)
BUN: 19 mg/dL (ref 8–23)
CO2: 22 mmol/L (ref 22–32)
Calcium: 8.6 mg/dL — ABNORMAL LOW (ref 8.9–10.3)
Chloride: 107 mmol/L (ref 98–111)
Creatinine, Ser: 0.95 mg/dL (ref 0.44–1.00)
GFR, Estimated: >60 mL/min (ref >60)
Glucose, Bld: 114 mg/dL — ABNORMAL HIGH (ref 70–99)
Potassium: 4.5 mmol/L (ref 3.5–5.1)
Sodium: 137 mmol/L (ref 135–145)
Total Bilirubin: 0.4 mg/dL (ref 0.3–1.2)
Total Protein: 6.6 g/dL (ref 6.5–8.1)

## 2022-06-30 LAB — MAGNESIUM: Magnesium: 1.2 mg/dL — ABNORMAL LOW (ref 1.7–2.4)

## 2022-06-30 MED ORDER — PANTOPRAZOLE SODIUM 40 MG PO TBEC: 40.0000 mg | DELAYED_RELEASE_TABLET | Freq: Every day | ORAL | 3 refills | Status: DC

## 2022-06-30 MED ORDER — MAGNESIUM OXIDE -MG SUPPLEMENT 400 (240 MG) MG PO TABS: 400.0000 mg | ORAL_TABLET | Freq: Every day | ORAL | 0 refills | Status: DC

## 2022-06-30 MED ORDER — SODIUM CHLORIDE 0.9 % IV SOLN
INTRAVENOUS | Status: DC
  Filled 2022-06-30: qty 250

## 2022-06-30 MED ORDER — MAGNESIUM SULFATE 2 GM/50ML IV SOLN
2.0000 g | Freq: Once | INTRAVENOUS | Status: AC
  Administered 2022-06-30: 2 g via INTRAVENOUS
  Filled 2022-06-30: qty 50

## 2022-06-30 NOTE — Progress Notes (Signed)
Patient here for oncology follow-up appointment, concerns of epigastric discomfort 6/10. Has taken gas X, tums OTC and taking Omeprazole. The pain come and takes her breath. She feels she cant breathe and she is hoarse. She has a slight appetite and also has back and leg pain.

## 2022-06-30 NOTE — Progress Notes (Signed)
Symptom Management [place] at [facility] [phone] [phone]  Patient Care Team: Carolynne Edouard as PCP - General (Physician Assistant) Telford Nab, RN as Oncology Nurse Navigator Grayland Ormond, Kathlene November, MD as Consulting Physician (Oncology)   NAME OF PATIENT: Michelle Moore  [id number]  August 18, 1956   DATE OF VISIT: 06/30/22  REASON FOR CONSULT: Michelle Moore is a 66 y.o. 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  Patient is status post right upper lobectomy on March 17, 2022.  She had 1 of 33 lymph nodes positive for disease.    INTERVAL HISTORY: Patient received cycle 4 cisplatin docetaxel chemotherapy on 06/22/2022.  Patient presents to [facility] today for evaluation of reflux symptoms.  Patient reports since last treatment, she has had epigastric discomfort/burning up her esophagus and has at times felt like food/medications is getting stuck in her esophagus.  She denies fever or chills.  No nausea or vomiting.  No diarrhea.  No abdominal pain or distention.  She has tried Gas-X and has felt bloated.  She is on daily omeprazole.  Patient reports that she has felt similarly the last time her magnesium level was low.  Denies any neurologic complaints. Denies recent fevers or illnesses. Denies any easy bleeding or bruising. Reports fair appetite and denies weight loss. Denies chest pain.  Denies urinary complaints. Patient offers no further specific complaints today.   PAST MEDICAL HISTORY: Past Medical History:  Diagnosis Date   Adenoma of right adrenal gland 12/07/2021   a.) CT chest 12/07/2021; measured 2.3 cm; not FDG avid on 01/06/2022 PET CT.   Anxiety    a.) uses BZO (alprazolam) PRN   Aortic atherosclerosis (HCC)    Arthritis    Bladder mass 12/07/2021   a.) CT abd 12/07/2021 --> 9 x 8 mm nodular  enhancing focus at the base of the bladder; suspicious for urothelial neoplasm.   COPD (chronic obstructive pulmonary disease) (HCC)    Coronary artery disease 10/16/2009   a.) LHC/PCI 10/16/2009: EF 60%; 50% OM1, 99% dRCA (3.0 x 18 mm Xience V DES). b.) LHC 07/31/2014: EF 65%; 40% pLAD, 20% pLCx, 40% OM1, 30% oRCA, 50% pRCA, 20% mRCA, 10% ISR stent to dRCA; further intervention deferred opting for med. mgmt.   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 b.) s/p lumpectomy, adjuvant XRT; currently on extended AI (anastrozole) therapy   Essential hypertension    GERD (gastroesophageal reflux disease)    HLD (hyperlipidemia)    Left thyroid nodule 12/07/2021   a.) CT chest; measured 2.6 cm   Long term current use of immunosuppressive drug    a.) MTX for RA.   OSA on CPAP    Osteopenia    Personal history of radiation therapy    Pneumonia    PRES (posterior reversible encephalopathy syndrome) 07/14/2014   Pulmonary nodules 11/08/2021   a.) CXR 11/08/2021: 80m nodular density RUL. b.) CT chest 12/07/2021: 1.9 x 1.4 x 2.5 spiculated lesion anterior RUL. c.) PET CT 01/06/2022: 2.2 x 1.9 spiculated anterior RUL mass (SUV max 16.4); d.) Bx (+) NSCLC, favoring SCC with tumor necrosis; p40/CK7/GATA3 (+), TTF-1 (-)   Rheumatoid arthritis (HCC)    a.) on MTX   Seasonal allergies  Seizures ([place]) 2014   a.) x 1 episode; etiology never determined.   Sinus headache    Squamous cell lung cancer, right ([place]) 03/17/2022   a.) RUL lobectomy 03/17/2022 --> Bx (+) for invasive moderately differentiated SCC of the RIGHT upper lobe (PDL-1: 5%) --> stage IIIA SCC (pT3, pN1, cM0)   ST elevation myocardial infarction (STEMI) of inferior wall ([place]) 10/16/2009   a.) LHC/PCI 10/16/2009: EF 60%; 50% OM1, 99% dRCA (3.0 x 18 mm Xience V DES).   Tubular adenoma    Valvular heart disease      PAST SURGICAL HISTORY:  Past Surgical History:  Procedure Laterality Date   ABDOMINAL HYSTERECTOMY     ANTERIOR CERVICAL DECOMP/DISCECTOMY FUSION N/A 01/20/2014   Procedure: ANTERIOR CERVICAL DECOMPRESSION/DISCECTOMY FUSION 2 LEVELS cervical five/six six Tarry Kos;  Surgeon: Ophelia Charter, MD;  Location: [place] [facility];  Service: Neurosurgery;  Laterality: N/A;   APPENDECTOMY     BACK SURGERY     lumbar   BLADDER INSTILLATION N/A 05/02/2022   Procedure: BLADDER INSTILLATION OF GEMCITABINE;  Surgeon: Hollice Espy, MD;  Location: [facility];  Service: Urology;  Laterality: N/A;   BREAST BIOPSY Right 02/01/2018   x shape, DCIS    BREAST BIOPSY  01/16/2019   coil, BENIGN MAMMARY TISSUE WITH MODERATE STROMAL FIBROSIS AND COARSE DYSTROPHIC CALCIFICATIONS of the RIGHT breast   BREAST LUMPECTOMY Right 03/07/2018   Procedure: BREAST LUMPECTOMY/ RE EXCISION;  Surgeon: Herbert Pun, MD;  Location: [place] [facility];  Service: General;  Laterality: Right;   CARDIAC CATHETERIZATION Left 07/31/2014   Procedure: CARDIAC CATHETERIZATION; Location: [facility]; Surgeon: Serafina Royals, MD   COLONOSCOPY WITH PROPOFOL N/A 02/11/2021   Procedure: COLONOSCOPY WITH PROPOFOL;  Surgeon: Lucilla Lame, MD;  Location: [place];  Service: Endoscopy;  Laterality: N/A;  Requests Early   CORONARY ANGIOPLASTY WITH STENT PLACEMENT Left 10/16/2009   Procedure: CORONARY ANGIOPLASTY WITH STENT PLACEMENT; Location: [place]; Surgeon: Katrine Coho, MD   CYSTOSCOPY W/ RETROGRADES Bilateral 05/02/2022   Procedure: CYSTOSCOPY WITH RETROGRADE PYELOGRAM;  Surgeon: Hollice Espy, MD;  Location: [facility];  Service: Urology;  Laterality: Bilateral;   INTERCOSTAL NERVE BLOCK Right 03/17/2022   Procedure: INTERCOSTAL NERVE BLOCK;  Surgeon: Melrose Nakayama, MD;  Location: [place];  Service: Thoracic;  Laterality: Right;   LASIK Bilateral    LUNG LOBECTOMY Right 03/17/2022   right upper lobe   LYMPH NODE DISSECTION Right  03/17/2022   Procedure: LYMPH NODE DISSECTION;  Surgeon: Melrose Nakayama, MD;  Location: [place];  Service: Thoracic;  Laterality: Right;   PARTIAL MASTECTOMY WITH NEEDLE LOCALIZATION Right 02/21/2018   Procedure: PARTIAL MASTECTOMY WITH NEEDLE LOCALIZATION;  Surgeon: Herbert Pun, MD;  Location: [facility];  Service: General;  Laterality: Right;   POLYPECTOMY N/A 02/11/2021   Procedure: POLYPECTOMY;  Surgeon: Lucilla Lame, MD;  Location: [place];  Service: Endoscopy;  Laterality: N/A;   ROTATOR CUFF REPAIR Left    SHOULDER ARTHROSCOPY WITH ROTATOR CUFF REPAIR AND OPEN BICEPS TENODESIS Right 08/07/2019   Procedure: SHOULDER ARTHROSCOPY WITH DEBRIDEMENT, DECOMPRESSION, ROTATOR CUFF REPAIR AND BICEPS TENODESIS. - RNFA;  Surgeon: Corky Mull, MD;  Location: [facility];  Service: Orthopedics;  Laterality: Right;   TOE SURGERY Right    has pin and plate in it   TRANSURETHRAL RESECTION OF BLADDER TUMOR N/A 05/02/2022   Procedure: TRANSURETHRAL RESECTION OF BLADDER TUMOR (TURBT);  Surgeon: Hollice Espy, MD;  Location: [facility];  Service: Urology;  Laterality: N/A;   VIDEO BRONCHOSCOPY WITH ENDOBRONCHIAL ULTRASOUND  N/A 03/02/2022   Procedure: VIDEO BRONCHOSCOPY WITH ENDOBRONCHIAL ULTRASOUND;  Surgeon: Tyler Pita, MD;  Location: [facility];  Service: Cardiopulmonary;  Laterality: N/A;    HEMATOLOGY/ONCOLOGY HISTORY:  Oncology History  Ductal carcinoma in situ (DCIS) of right breast  03/18/2018 Initial Diagnosis   Ductal carcinoma in situ (DCIS) of right breast   03/18/2018 Cancer Staging   Staging form: Breast, AJCC 8th Edition - Clinical: Stage 0 (cTis (DCIS), cN0, cM0, ER+, PR-, HER2-) - Signed by Lloyd Huger, MD on 03/18/2018   Squamous cell carcinoma of right lung ([place])  02/15/2022 Initial Diagnosis   Squamous cell carcinoma of right lung ([place])   04/07/2022 Cancer Staging   Staging form: Lung, AJCC 8th Edition - Pathologic stage from 04/07/2022: Stage IIIA  (pT3, pN1, cM0) - Signed by Lloyd Huger, MD on 04/07/2022   04/20/2022 - 04/22/2022 Chemotherapy   Patient is on Treatment Plan : LUNG Cisplatin + Docetaxel q21d     04/20/2022 -  Chemotherapy   Patient is on Treatment Plan : LUNG Cisplatin + Docetaxel q21d       ALLERGIES:  is allergic to other.  MEDICATIONS:  Current Outpatient Medications  Medication Sig Dispense Refill   albuterol (PROVENTIL) (2.5 MG/3ML) 0.083% nebulizer solution Take 3 mLs (2.5 mg total) by nebulization every 6 (six) hours as needed for wheezing or shortness of breath. 125 mL 2   albuterol (VENTOLIN HFA) 108 (90 Base) MCG/ACT inhaler Inhale 1-2 puffs into the lungs every 6 (six) hours as needed for wheezing or shortness of breath. 1 g 1   alendronate (FOSAMAX) 70 MG tablet Take 1 tablet (70 mg total) by mouth once a week. Take with a full glass of water on an empty stomach. Stand or sit upright for 1 hour after taking (Patient taking differently: Take 70 mg by mouth once a week. Take with a full glass of water on an empty stomach. Stand or sit upright for 1 hour after taking WEDNESDAY) 12 tablet 1   ALPRAZolam (XANAX) 0.25 MG tablet TAKE (1) TABLET BY MOUTH DAILY AT BEDTIME AS NEEDED FOR ANXIETY 30 tablet 1   anastrozole (ARIMIDEX) 1 MG tablet Take 1 tablet (1 mg total) by mouth at bedtime. 30 tablet 10   aspirin-acetaminophen-caffeine (EXCEDRIN MIGRAINE) 568-127-51 MG tablet Take by mouth every 6 (six) hours as needed for headache.     fluconazole (DIFLUCAN) 150 MG tablet Take 1 tab po once and may repeat in 3 days if symptoms persist 3 tablet 0   fluticasone (FLONASE) 50 MCG/ACT nasal spray Place 2 sprays into both nostrils daily as needed for allergies. 60 mL 1   fluticasone-salmeterol (ADVAIR) 250-50 MCG/ACT AEPB Inhale 1 puff into the lungs in the morning and at bedtime. 60 each 5   folic acid (FOLVITE) 1 MG tablet Take 2 mg by mouth in the morning.     gabapentin (NEURONTIN) 300 MG capsule Take 1 capsule (300  mg total) by mouth 2 (two) times daily. 60 capsule 6   HYDROcodone-acetaminophen (NORCO/VICODIN) 5-325 MG tablet Take 1-2 tablets by mouth every 6 (six) hours as needed for moderate pain. 10 tablet 0   methotrexate (RHEUMATREX) 2.5 MG tablet Take 20 mg by mouth once a week. WEDNESDAY     montelukast (SINGULAIR) 10 MG tablet Take 1 tablet (10 mg total) by mouth at bedtime. 90 tablet 1   OLANZapine (ZYPREXA) 10 MG tablet Take 1 tablet (10 mg total) by mouth at bedtime. For nausea 30 tablet 0  omeprazole (PRILOSEC) 40 MG capsule TAKE (1) CAPSULE BY MOUTH EVERY DAY 90 capsule 3   ondansetron (ZOFRAN) 8 MG tablet Take 1 tablet (8 mg total) by mouth every 8 (eight) hours as needed for nausea or vomiting. 60 tablet 0   oxybutynin (DITROPAN) 5 MG tablet Take 1 tablet (5 mg total) by mouth every 8 (eight) hours as needed for bladder spasms. 30 tablet 0   potassium chloride (KLOR-CON) 10 MEQ tablet TAKE (1) TABLET BY MOUTH EVERY DAY 90 tablet 1   rosuvastatin (CRESTOR) 40 MG tablet Take 40 mg by mouth every evening.     sertraline (ZOLOFT) 100 MG tablet TAKE (1) TABLET BY MOUTH EVERY DAY 90 tablet 1   sulfamethoxazole-trimethoprim (BACTRIM DS) 800-160 MG tablet Take 1 tablet by mouth 2 (two) times daily. 10 tablet 0   traMADol (ULTRAM) 50 MG tablet TAKE (1) TABLET BY MOUTH THREE TIMES A DAY AS NEEDED     traMADol (ULTRAM) 50 MG tablet Take 1-2 tablets (50-100 mg total) by mouth 3 (three) times daily as needed. 60 tablet 0   No current facility-administered medications for this visit.    VITAL SIGNS: There were no vitals taken for this visit. There were no vitals filed for this visit.  Estimated body mass index is 27.73 kg/m as calculated from the following:   Height as of 05/31/22: 5' (1.524 m).   Weight as of 06/21/22: 142 lb (64.4 kg).  LABS: CBC:    Component Value Date/Time   WBC 5.8 06/21/2022 0841   HGB 9.5 (L) 06/21/2022 0841   HGB 14.8 06/09/2021 1125   HCT 30.2 (L) 06/21/2022 0841    HCT 44.2 06/09/2021 1125   PLT 186 06/21/2022 0841   PLT 255 06/09/2021 1125   MCV 94.7 06/21/2022 0841   MCV 93 06/09/2021 1125   NEUTROABS 3.7 06/21/2022 0841   NEUTROABS 6.1 06/09/2021 1125   LYMPHSABS 1.4 06/21/2022 0841   LYMPHSABS 2.1 06/09/2021 1125   MONOABS 0.6 06/21/2022 0841   EOSABS 0.0 06/21/2022 0841   EOSABS 0.2 06/09/2021 1125   BASOSABS 0.1 06/21/2022 0841   BASOSABS 0.1 06/09/2021 1125   Comprehensive Metabolic Panel:    Component Value Date/Time   NA 140 06/21/2022 0841   NA 137 06/09/2021 1125   K 4.3 06/21/2022 0841   CL 107 06/21/2022 0841   CO2 28 06/21/2022 0841   BUN 12 06/21/2022 0841   BUN 8 06/09/2021 1125   CREATININE 0.66 06/21/2022 0841   GLUCOSE 108 (H) 06/21/2022 0841   CALCIUM 8.7 (L) 06/21/2022 0841   AST 16 06/21/2022 0841   ALT 10 06/21/2022 0841   ALKPHOS 55 06/21/2022 0841   BILITOT 0.3 06/21/2022 0841   BILITOT 0.3 06/09/2021 1125   PROT 6.1 (L) 06/21/2022 0841   PROT 6.5 06/09/2021 1125   ALBUMIN 3.2 (L) 06/21/2022 0841   ALBUMIN 4.2 06/09/2021 1125    RADIOGRAPHIC STUDIES: No results found.  PERFORMANCE STATUS (ECOG) : 1 - Symptomatic but completely ambulatory  Review of Systems Unless otherwise noted, a complete review of systems is negative.  Physical Exam General: NAD Cardiovascular: regular rate and rhythm Pulmonary: clear ant fields Abdomen: soft, nontender, + bowel sounds GU: no suprapubic tenderness Extremities: no edema, no joint deformities Skin: no rashes Neurological: Weakness but otherwise nonfocal  IMPRESSION/PLAN: Hypomagnesemia-likely secondary to chemotherapy.  Proceed with IV repletion today and will start on daily Mag-Ox.  Repeat labs next week  GERD-discussed dietary/lifestyle modifications.  We will rotate from omeprazole  to pantoprazole.  I recommend referral to GI.  However, patient states that her husband has an upcoming GI appointment and she may just take his appointment instead.  She will  let me know if she requires a formal referral.  Case and plan discussed with Dr. Grayland Ormond  Patient expressed understanding and was in agreement with this plan. She also understands that She can call clinic at any time with any questions, concerns, or complaints.   Thank you for allowing me to participate in the care of this very pleasant patient.   Time Total: 15 minutes  Visit consisted of counseling and education dealing with the complex and emotionally intense issues of symptom management in the setting of serious illness.Greater than 50%  of this time was spent counseling and coordinating care related to the above assessment and plan.  Signed by: Altha Harm, PhD, NP-C

## 2022-06-30 NOTE — Telephone Encounter (Signed)
Called patient she agrees to come to [facility] at [address] labs and NP [address]

## 2022-07-01 ENCOUNTER — Encounter: Payer: Self-pay | Admitting: Hospice and Palliative Medicine

## 2022-07-05 ENCOUNTER — Encounter: Payer: Self-pay | Admitting: Pulmonary Disease

## 2022-07-05 ENCOUNTER — Encounter: Payer: Self-pay | Admitting: Oncology

## 2022-07-05 ENCOUNTER — Ambulatory Visit: Payer: Medicare HMO | Admitting: Urology

## 2022-07-05 ENCOUNTER — Encounter: Payer: Self-pay | Admitting: Urology

## 2022-07-06 ENCOUNTER — Inpatient Hospital Stay: Payer: Medicare HMO

## 2022-07-06 ENCOUNTER — Other Ambulatory Visit: Payer: Self-pay

## 2022-07-06 ENCOUNTER — Ambulatory Visit: Payer: Medicare HMO | Admitting: Pulmonary Disease

## 2022-07-07 ENCOUNTER — Emergency Department
Admission: EM | Admit: 2022-07-07 | Discharge: 2022-07-07 | Disposition: A | Discharge status: Home / Self Care | Payer: Medicare HMO | Attending: Emergency Medicine | Admitting: Emergency Medicine

## 2022-07-07 ENCOUNTER — Emergency Department: Payer: Medicare HMO

## 2022-07-07 ENCOUNTER — Other Ambulatory Visit: Payer: Self-pay

## 2022-07-07 ENCOUNTER — Telehealth: Payer: Self-pay | Admitting: Pulmonary Disease

## 2022-07-07 ENCOUNTER — Encounter: Payer: Self-pay | Admitting: Emergency Medicine

## 2022-07-07 ENCOUNTER — Ambulatory Visit: Payer: Medicare HMO | Admitting: Physician Assistant

## 2022-07-07 DIAGNOSIS — Z85828 Personal history of other malignant neoplasm of skin: Secondary | ICD-10-CM | POA: Diagnosis not present

## 2022-07-07 DIAGNOSIS — J189 Pneumonia, unspecified organism: Secondary | ICD-10-CM | POA: Diagnosis not present

## 2022-07-07 DIAGNOSIS — R0600 Dyspnea, unspecified: Secondary | ICD-10-CM

## 2022-07-07 DIAGNOSIS — I10 Essential (primary) hypertension: Secondary | ICD-10-CM | POA: Diagnosis not present

## 2022-07-07 DIAGNOSIS — J168 Pneumonia due to other specified infectious organisms: Secondary | ICD-10-CM | POA: Diagnosis not present

## 2022-07-07 DIAGNOSIS — I2510 Atherosclerotic heart disease of native coronary artery without angina pectoris: Secondary | ICD-10-CM | POA: Insufficient documentation

## 2022-07-07 DIAGNOSIS — D649 Anemia, unspecified: Secondary | ICD-10-CM | POA: Insufficient documentation

## 2022-07-07 DIAGNOSIS — J90 Pleural effusion, not elsewhere classified: Secondary | ICD-10-CM | POA: Insufficient documentation

## 2022-07-07 DIAGNOSIS — C3490 Malignant neoplasm of unspecified part of unspecified bronchus or lung: Secondary | ICD-10-CM | POA: Diagnosis not present

## 2022-07-07 DIAGNOSIS — R600 Localized edema: Secondary | ICD-10-CM | POA: Diagnosis not present

## 2022-07-07 DIAGNOSIS — R0602 Shortness of breath: Secondary | ICD-10-CM | POA: Diagnosis not present

## 2022-07-07 LAB — CBC
HCT: 27 % — ABNORMAL LOW (ref 36.0–46.0)
Hemoglobin: 8.1 g/dL — ABNORMAL LOW (ref 12.0–15.0)
MCH: 29.7 pg (ref 26.0–34.0)
MCHC: 30 g/dL (ref 30.0–36.0)
MCV: 98.9 fL (ref 80.0–100.0)
Platelets: 140 10*3/uL — ABNORMAL LOW (ref 150–400)
RBC: 2.73 MIL/uL — ABNORMAL LOW (ref 3.87–5.11)
RDW: 20.3 % — ABNORMAL HIGH (ref 11.5–15.5)
WBC: 3.9 10*3/uL — ABNORMAL LOW (ref 4.0–10.5)
nRBC: 0 % (ref 0.0–0.2)

## 2022-07-07 LAB — BASIC METABOLIC PANEL
Anion gap: 8 (ref 5–15)
BUN: 12 mg/dL (ref 8–23)
CO2: 23 mmol/L (ref 22–32)
Calcium: 7.9 mg/dL — ABNORMAL LOW (ref 8.9–10.3)
Chloride: 107 mmol/L (ref 98–111)
Creatinine, Ser: 0.88 mg/dL (ref 0.44–1.00)
GFR, Estimated: >60 mL/min (ref >60)
Glucose, Bld: 124 mg/dL — ABNORMAL HIGH (ref 70–99)
Potassium: 4.1 mmol/L (ref 3.5–5.1)
Sodium: 138 mmol/L (ref 135–145)

## 2022-07-07 LAB — BRAIN NATRIURETIC PEPTIDE: B Natriuretic Peptide: 82.3 pg/mL (ref 0.0–100.0)

## 2022-07-07 LAB — D-DIMER, QUANTITATIVE: D-Dimer, Quant: 1.86 ug/mL-FEU — ABNORMAL HIGH (ref 0.00–0.50)

## 2022-07-07 LAB — TROPONIN I (HIGH SENSITIVITY): Troponin I (High Sensitivity): 4 ng/L (ref <18)

## 2022-07-07 MED ORDER — IOHEXOL 350 MG/ML SOLN
75.0000 mL | Freq: Once | INTRAVENOUS | Status: AC | PRN
  Administered 2022-07-07: 75 mL via INTRAVENOUS

## 2022-07-07 MED ORDER — AZITHROMYCIN 250 MG PO TABS: ORAL_TABLET | ORAL | 0 refills | Status: DC

## 2022-07-07 MED ORDER — CEFDINIR 300 MG PO CAPS: 300.0000 mg | ORAL_CAPSULE | Freq: Two times a day (BID) | ORAL | 0 refills | Status: AC

## 2022-07-07 NOTE — ED Triage Notes (Signed)
Patient to ED via POV from home for SOB for the past 2-3 days. Patient have top part of lung removed in July for cancer. Patient has chemo treatment 3 weeks ago. Patient states she feels like she is retaining fluid.

## 2022-07-07 NOTE — Telephone Encounter (Signed)
With the feet swelling and his severe shortness of breath I think it is best if she comes to be evaluated at the ED.

## 2022-07-07 NOTE — Telephone Encounter (Signed)
Called and spoke to patient. C/o increased sob with exertion, dry cough, sinus pressure, postnasal drainage, bilateral feet/leg edema x4d.  Using albuterol solution once daily and Advair BID. No recent covid test. She will test and call back with results.  No supplemental oxygen. Spo2 maintaining around 93%.  Dr. Patsey Berthold, please advise. Thanks

## 2022-07-07 NOTE — Telephone Encounter (Signed)
Patient is aware of below message/recommendations and voiced her understanding. She will present to ED. Nothing further needed.

## 2022-07-07 NOTE — ED Notes (Addendum)
Pt transported to CT - will obtained blood work and IV upon return

## 2022-07-07 NOTE — ED Notes (Signed)
Lab called to add on troponin 

## 2022-07-07 NOTE — ED Provider Notes (Signed)
[facility] Provider Note    Event Date/Time   First MD Initiated Contact with Patient 07/07/22 1219     (approximate)   History   Shortness of Breath   HPI  Michelle Moore is a 66 y.o. female with a past medical history of squamous cell carcinoma of the right lung status post lobectomy on 03/17/2022 who presents today for evaluation of shortness of breath that has been progressive over the past 3 to 4 days.  She felt that she was "coming down with something" as she developed a "deep cough.  Patient also noticed that she had bilateral lower extremity swelling.  She denies calf pain she denies chest pain.  No chest pain with deep inspiration.  She reports that she is normally able to walk 1-2 blocks without becoming short of breath, but now she feels that she is not able to walk quite the same distance.  She feels that her symptoms got worse after a recent [place] the [place].  She was told that her white blood cell counts are low and that she is "prone to developing an infection."  She denies any recent weight gain.  No cough or fever/chills.  Patient Active Problem List   Diagnosis Date Noted   S/P lobectomy of lung 03/17/2022   S/P robot-assisted surgical procedure 03/17/2022   Squamous cell carcinoma of right lung ([place]) 02/15/2022   S/P cardiac catheterization 04/06/2021   Osteoporosis without current pathological fracture 04/06/2021   Aromatase inhibitor use 04/06/2021   Special screening for malignant neoplasms, colon    Family history of colon cancer    Polyp of descending colon    Cough 08/28/2020   Hypokalemia 07/25/2020   Urinary tract infection without hematuria 07/25/2020   Recurrent displacement of lumbar disc 04/08/2020   Essential hypertension 01/22/2020   Degenerative tear of glenoid labrum of right shoulder 07/19/2019   Nontraumatic complete tear of right rotator cuff 07/19/2019   Rotator cuff tendinitis, right 07/19/2019   Tendinitis of  upper biceps tendon of right shoulder 07/19/2019   Encounter for general adult medical examination with abnormal findings 05/29/2019   Dysuria 05/29/2019   Acute non-recurrent pansinusitis 06/01/2018   Perennial allergic rhinitis 06/01/2018   Generalized anxiety disorder 06/01/2018   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   Coronary artery disease 11/09/2017   Mixed hyperlipidemia 11/09/2017   Bilateral carotid artery stenosis 08/14/2014   Gastroesophageal reflux disease with esophagitis 08/14/2014   OSA on CPAP 07/14/2014   Weight loss of more than 10% body weight 07/14/2014   Cervical spondylosis with radiculopathy 01/20/2014   Cervical spondylosis without myelopathy 01/20/2014          Physical Exam   Triage Vital Signs: ED Triage Vitals  Enc Vitals Group     BP 07/07/22 1202 (!) 156/58     Pulse Rate 07/07/22 1202 (!) 105     Resp 07/07/22 1202 18     Temp 07/07/22 1202 99.7 F (37.6 C)     Temp Source 07/07/22 1202 Oral     SpO2 07/07/22 1202 97 %     Weight 07/07/22 1200 136 lb (61.7 kg)     Height 07/07/22 1200 5' (1.524 m)     Head Circumference --      Peak Flow --      Pain Score 07/07/22 1200 0     Pain Loc --      Pain Edu? --  Excl. in [place]? --     Most recent vital signs: Vitals:   07/07/22 1330 07/07/22 1345  BP: 122/63   Pulse: 90 88  Resp: (!) 22   Temp:    SpO2: 100% 100%    Physical Exam Vitals and nursing note reviewed.  Constitutional:      General: Awake and alert. No acute distress.    Appearance: Normal appearance. The patient is normal weight.  HENT:     Head: Normocephalic and atraumatic.     Mouth: Mucous membranes are moist.  Eyes:     General: PERRL. Normal EOMs        Right eye: No discharge.        Left eye: No discharge.     Conjunctiva/sclera: Conjunctivae normal.  Cardiovascular:     Rate and Rhythm: Normal rate and regular rhythm.     Pulses: Normal pulses.  Pulmonary:     Effort: Pulmonary  effort is normal. No respiratory distress.  Speaking easily in complete sentences    Breath sounds: Normal breath sounds.  Abdominal:     Abdomen is soft. There is no abdominal tenderness. No rebound or guarding. No distention. Musculoskeletal:        General: No swelling. Normal range of motion.     Cervical back: Normal range of motion and neck supple.  No appreciable lower extremity pitting edema, legs equal in circumference bilaterally.  Normal distal pulses.  No calf tenderness.  No skin color changes.  Normal 2+ pedal edema bilaterally Skin:    General: Skin is warm and dry.     Capillary Refill: Capillary refill takes less than 2 seconds.     Findings: No rash.  Neurological:     Mental Status: The patient is awake and alert.      ED Results / Procedures / Treatments   Labs (all labs ordered are listed, but only abnormal results are displayed) Labs Reviewed  BASIC METABOLIC PANEL - Abnormal; Notable for the following components:      Result Value   Glucose, Bld 124 (*)    Calcium 7.9 (*)    All other components within normal limits  CBC - Abnormal; Notable for the following components:   WBC 3.9 (*)    RBC 2.73 (*)    Hemoglobin 8.1 (*)    HCT 27.0 (*)    RDW 20.3 (*)    Platelets 140 (*)    All other components within normal limits  D-DIMER, QUANTITATIVE - Abnormal; Notable for the following components:   D-Dimer, Quant 1.86 (*)    All other components within normal limits  BRAIN NATRIURETIC PEPTIDE  TROPONIN I (HIGH SENSITIVITY)  TROPONIN I (HIGH SENSITIVITY)     EKG     RADIOLOGY I independently reviewed and interpreted imaging and agree with radiologists findings.     PROCEDURES:  Critical Care performed:   Procedures   MEDICATIONS ORDERED IN ED: Medications  iohexol (OMNIPAQUE) 350 MG/ML injection 75 mL (75 mLs Intravenous Contrast Given 07/07/22 1403)     IMPRESSION / MDM / ASSESSMENT AND PLAN / ED COURSE  I reviewed the triage vital  signs and the nursing notes.   Differential diagnosis includes, but is not limited to, pleural effusion, pericardial effusion, symptomatic anemia, electrolyte disarray, pulmonary embolism.  Patient is awake and alert, hemodynamically stable and afebrile.  She has normal oxygen saturation on room air at 97%, though the RN placed her on 2 L for comfort.  She is able  to speak easily in complete sentences.  EKG obtained at triage demonstrates sinus tachycardia per my independent interpretation.  Chest x-ray reveals progression of her right lower lobe airspace disease and small right pleural effusion with a possible pneumonia.  Patient does report that she has had a deep cough and has been feeling "run down" over the last couple of days.  D-dimer was elevated, obtained because of her cancer history and new shortness of breath, and this was negative for acute pulmonary embolism.  She was also found to have a slightly depressed H&H as compared to previous.  She denies any bleeding from anywhere.  She does not meet transfusion criteria.  This finding was discussed with the patient and her husband, and she was advised to have this rechecked by her outpatient providers this week as this may be contributing to her shortness of breath.  She is able to ambulate without desaturating.  I discussed the case with Dr. Cinda Quest, who agrees that we should treat the patient for pneumonia given her new cough, her feeling "rundown" recently, and the findings on the x-ray.  She was treated for community-acquired pneumonia.  Discussed all findings with patient and her husband.  Recommended very close outpatient follow-up and return precautions.  Patient understands and agrees with plan.  Discharged in stable condition.   Patient's presentation is most consistent with acute presentation with potential threat to life or bodily function.       FINAL CLINICAL IMPRESSION(S) / ED DIAGNOSES   Final diagnoses:  Dyspnea, unspecified  type  Pneumonia of right lower lobe due to infectious organism  Pleural effusion  Anemia, unspecified type     Rx / DC Orders   ED Discharge Orders          Ordered    cefdinir (OMNICEF) 300 MG capsule  2 times daily        07/07/22 1455    azithromycin (ZITHROMAX Z-PAK) 250 MG tablet        07/07/22 1455             Note:  This document was prepared using Dragon voice recognition software and may include unintentional dictation errors.   Marquette Old, PA-C 07/07/22 1725    Nena Polio, MD 07/08/22 [phone]

## 2022-07-07 NOTE — Discharge Instructions (Signed)
Your x-ray was suspicious for pneumonia, and given your feeling of being rundown and your new productive cough, please take the antibiotics as prescribed.  Your blood work also showed that your red blood cells are slightly lower than they were last week, please have this rechecked by your outpatient provider this week.  The CT scan did not reveal any blood clots.  Please return for any new, worsening, or change in symptoms or other concerns including worsening pain, trouble breathing, fevers, worsening swelling, calf pain, or any other concerns.  It was a pleasure caring for you today.

## 2022-07-12 ENCOUNTER — Inpatient Hospital Stay (HOSPITAL_BASED_OUTPATIENT_CLINIC_OR_DEPARTMENT_OTHER): Payer: Medicare HMO | Admitting: Oncology

## 2022-07-12 ENCOUNTER — Encounter: Payer: Self-pay | Admitting: Oncology

## 2022-07-12 ENCOUNTER — Inpatient Hospital Stay: Payer: Medicare HMO

## 2022-07-12 VITALS — BP 132/84 | HR 95 | Temp 97.0°F | Resp 16 | Ht 60.0 in | Wt 140.0 lb

## 2022-07-12 DIAGNOSIS — C3491 Malignant neoplasm of unspecified part of right bronchus or lung: Secondary | ICD-10-CM

## 2022-07-12 DIAGNOSIS — C3411 Malignant neoplasm of upper lobe, right bronchus or lung: Secondary | ICD-10-CM | POA: Diagnosis not present

## 2022-07-12 DIAGNOSIS — R140 Abdominal distension (gaseous): Secondary | ICD-10-CM | POA: Diagnosis not present

## 2022-07-12 DIAGNOSIS — K219 Gastro-esophageal reflux disease without esophagitis: Secondary | ICD-10-CM

## 2022-07-12 DIAGNOSIS — R300 Dysuria: Secondary | ICD-10-CM | POA: Diagnosis not present

## 2022-07-12 DIAGNOSIS — Z79811 Long term (current) use of aromatase inhibitors: Secondary | ICD-10-CM | POA: Diagnosis not present

## 2022-07-12 DIAGNOSIS — Z5111 Encounter for antineoplastic chemotherapy: Secondary | ICD-10-CM | POA: Diagnosis not present

## 2022-07-12 DIAGNOSIS — R630 Anorexia: Secondary | ICD-10-CM

## 2022-07-12 DIAGNOSIS — M8580 Other specified disorders of bone density and structure, unspecified site: Secondary | ICD-10-CM | POA: Diagnosis not present

## 2022-07-12 DIAGNOSIS — D0511 Intraductal carcinoma in situ of right breast: Secondary | ICD-10-CM | POA: Diagnosis not present

## 2022-07-12 DIAGNOSIS — Z79899 Other long term (current) drug therapy: Secondary | ICD-10-CM | POA: Diagnosis not present

## 2022-07-12 LAB — CBC WITH DIFFERENTIAL/PLATELET
Abs Immature Granulocytes: 0.02 10*3/uL (ref 0.00–0.07)
Basophils Absolute: 0.1 10*3/uL (ref 0.0–0.1)
Basophils Relative: 1 %
Eosinophils Absolute: 0 10*3/uL (ref 0.0–0.5)
Eosinophils Relative: 0 %
HCT: 28.5 % — ABNORMAL LOW (ref 36.0–46.0)
Hemoglobin: 8.9 g/dL — ABNORMAL LOW (ref 12.0–15.0)
Immature Granulocytes: 0 %
Lymphocytes Relative: 30 %
Lymphs Abs: 1.7 10*3/uL (ref 0.7–4.0)
MCH: 30.1 pg (ref 26.0–34.0)
MCHC: 31.2 g/dL (ref 30.0–36.0)
MCV: 96.3 fL (ref 80.0–100.0)
Monocytes Absolute: 0.9 10*3/uL (ref 0.1–1.0)
Monocytes Relative: 15 %
Neutro Abs: 3.2 10*3/uL (ref 1.7–7.7)
Neutrophils Relative %: 54 %
Platelets: 197 10*3/uL (ref 150–400)
RBC: 2.96 MIL/uL — ABNORMAL LOW (ref 3.87–5.11)
RDW: 20.3 % — ABNORMAL HIGH (ref 11.5–15.5)
WBC: 5.9 10*3/uL (ref 4.0–10.5)
nRBC: 0 % (ref 0.0–0.2)

## 2022-07-12 LAB — COMPREHENSIVE METABOLIC PANEL
ALT: 10 U/L (ref 0–44)
AST: 19 U/L (ref 15–41)
Albumin: 3.5 g/dL (ref 3.5–5.0)
Alkaline Phosphatase: 57 U/L (ref 38–126)
Anion gap: 8 (ref 5–15)
BUN: 17 mg/dL (ref 8–23)
CO2: 24 mmol/L (ref 22–32)
Calcium: 8.7 mg/dL — ABNORMAL LOW (ref 8.9–10.3)
Chloride: 104 mmol/L (ref 98–111)
Creatinine, Ser: 0.81 mg/dL (ref 0.44–1.00)
GFR, Estimated: >60 mL/min (ref >60)
Glucose, Bld: 112 mg/dL — ABNORMAL HIGH (ref 70–99)
Potassium: 4.2 mmol/L (ref 3.5–5.1)
Sodium: 136 mmol/L (ref 135–145)
Total Bilirubin: 0.2 mg/dL — ABNORMAL LOW (ref 0.3–1.2)
Total Protein: 6.6 g/dL (ref 6.5–8.1)

## 2022-07-12 LAB — MAGNESIUM: Magnesium: 1.9 mg/dL (ref 1.7–2.4)

## 2022-07-12 NOTE — Progress Notes (Signed)
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  INTERVAL HISTORY: Patient returns to clinic today for further evaluation.  She continues to have poor taste, poor appetite, and increased reflux.  Her weakness and fatigue is chronic and unchanged. She has no neurologic complaints.  She denies any recent fevers or illnesses. She has a good appetite and denies weight loss.  She denies any chest pain, shortness of breath, cough, or hemoptysis.  She denies any nausea, vomiting, constipation, or diarrhea.  Patient offers no further specific complaints today.  REVIEW OF SYSTEMS:   Review of Systems  Constitutional:  Positive for malaise/fatigue. Negative for fever and weight loss.  Respiratory: Negative.  Negative for cough, hemoptysis and shortness of breath.   Cardiovascular: Negative.  Negative for chest pain and leg swelling.  Gastrointestinal:  Positive for abdominal pain and heartburn.  Genitourinary:  Negative for dysuria and frequency.  Musculoskeletal: Negative.  Negative for back pain and myalgias.  Skin: Negative.  Negative for rash.  Neurological:  Positive for weakness. Negative for dizziness, sensory change and focal weakness.  Psychiatric/Behavioral: Negative.  The patient is not nervous/anxious.     As per HPI. Otherwise, a complete review of systems is negative.  PAST MEDICAL HISTORY: Past Medical History:  Diagnosis Date   Adenoma of right adrenal gland 12/07/2021   a.) CT chest 12/07/2021; measured 2.3 cm; not FDG avid on 01/06/2022 PET CT.   Anxiety    a.) uses BZO  (alprazolam) PRN   Aortic atherosclerosis (HCC)    Arthritis    Bladder mass 12/07/2021   a.) CT abd 12/07/2021 --> 9 x 8 mm nodular enhancing focus at the base of the bladder; suspicious for urothelial neoplasm.   COPD (chronic obstructive pulmonary disease) (HCC)    Coronary artery disease 10/16/2009   a.) LHC/PCI 10/16/2009: EF 60%; 50% OM1, 99% dRCA (3.0 x 18 mm Xience V DES). b.) LHC 07/31/2014: EF 65%; 40% pLAD, 20% pLCx, 40% OM1, 30% oRCA, 50% pRCA, 20% mRCA, 10% ISR stent to dRCA; further intervention deferred opting for med. mgmt.   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 b.) s/p lumpectomy, adjuvant XRT; currently on extended AI (anastrozole) therapy   Essential hypertension    GERD (gastroesophageal reflux disease)    HLD (hyperlipidemia)    Left thyroid nodule 12/07/2021   a.) CT chest; measured 2.6 cm   Long term current use of immunosuppressive drug    a.) MTX for RA.   OSA on CPAP    Osteopenia    Personal history of radiation therapy    Pneumonia    PRES (posterior reversible encephalopathy syndrome) 07/14/2014   Pulmonary nodules 11/08/2021   a.) CXR 11/08/2021: 54m nodular density RUL. b.) CT chest 12/07/2021: 1.9 x 1.4 x 2.5 spiculated lesion anterior RUL. c.) PET CT 01/06/2022: 2.2 x 1.9 spiculated anterior RUL mass (SUV max 16.4); d.) Bx (+) NSCLC, favoring SCC with tumor necrosis; p40/CK7/GATA3 (+), TTF-1 (-)   Rheumatoid  arthritis ([place])    a.) on MTX   Seasonal allergies    Seizures ([place]) 2014   a.) x 1 episode; etiology never determined.   Sinus headache    Squamous cell lung cancer, right ([place]) 03/17/2022   a.) RUL lobectomy 03/17/2022 --> Bx (+) for invasive moderately differentiated SCC of the RIGHT upper lobe (PDL-1: 5%) --> stage IIIA SCC (pT3, pN1, cM0)   ST elevation myocardial infarction (STEMI) of inferior wall ([place])  10/16/2009   a.) LHC/PCI 10/16/2009: EF 60%; 50% OM1, 99% dRCA (3.0 x 18 mm Xience V DES).   Tubular adenoma    Valvular heart disease     PAST SURGICAL HISTORY: Past Surgical History:  Procedure Laterality Date   ABDOMINAL HYSTERECTOMY     ANTERIOR CERVICAL DECOMP/DISCECTOMY FUSION N/A 01/20/2014   Procedure: ANTERIOR CERVICAL DECOMPRESSION/DISCECTOMY FUSION 2 LEVELS cervical five/six six Tarry Kos;  Surgeon: Ophelia Charter, MD;  Location: [facility];  Service: Neurosurgery;  Laterality: N/A;   APPENDECTOMY     BACK SURGERY     lumbar   BLADDER INSTILLATION N/A 05/02/2022   Procedure: BLADDER INSTILLATION OF GEMCITABINE;  Surgeon: Hollice Espy, MD;  Location: [facility];  Service: Urology;  Laterality: N/A;   BREAST BIOPSY Right 02/01/2018   x shape, DCIS    BREAST BIOPSY  01/16/2019   coil, BENIGN MAMMARY TISSUE WITH MODERATE STROMAL FIBROSIS AND COARSE DYSTROPHIC CALCIFICATIONS of the RIGHT breast   BREAST LUMPECTOMY Right 03/07/2018   Procedure: BREAST LUMPECTOMY/ RE EXCISION;  Surgeon: Herbert Pun, MD;  Location: [facility];  Service: General;  Laterality: Right;   CARDIAC CATHETERIZATION Left 07/31/2014   Procedure: CARDIAC CATHETERIZATION; Location: [facility]; Surgeon: Serafina Royals, MD   COLONOSCOPY WITH PROPOFOL N/A 02/11/2021   Procedure: COLONOSCOPY WITH PROPOFOL;  Surgeon: Lucilla Lame, MD;  Location: [facility];  Service: Endoscopy;  Laterality: N/A;  Requests Early   CORONARY ANGIOPLASTY WITH STENT PLACEMENT Left 10/16/2009   Procedure: CORONARY ANGIOPLASTY WITH STENT PLACEMENT; Location: [facility]; Surgeon: Katrine Coho, MD   CYSTOSCOPY W/ RETROGRADES Bilateral 05/02/2022   Procedure: CYSTOSCOPY WITH RETROGRADE PYELOGRAM;  Surgeon: Hollice Espy, MD;  Location: [facility];  Service: Urology;  Laterality: Bilateral;   INTERCOSTAL NERVE BLOCK Right 03/17/2022   Procedure: INTERCOSTAL NERVE BLOCK;  Surgeon: Melrose Nakayama, MD;  Location: [facility];  Service:  Thoracic;  Laterality: Right;   LASIK Bilateral    LUNG LOBECTOMY Right 03/17/2022   right upper lobe   LYMPH NODE DISSECTION Right 03/17/2022   Procedure: LYMPH NODE DISSECTION;  Surgeon: Melrose Nakayama, MD;  Location: [facility];  Service: Thoracic;  Laterality: Right;   PARTIAL MASTECTOMY WITH NEEDLE LOCALIZATION Right 02/21/2018   Procedure: PARTIAL MASTECTOMY WITH NEEDLE LOCALIZATION;  Surgeon: Herbert Pun, MD;  Location: [facility];  Service: General;  Laterality: Right;   POLYPECTOMY N/A 02/11/2021   Procedure: POLYPECTOMY;  Surgeon: Lucilla Lame, MD;  Location: [facility];  Service: Endoscopy;  Laterality: N/A;   ROTATOR CUFF REPAIR Left    SHOULDER ARTHROSCOPY WITH ROTATOR CUFF REPAIR AND OPEN BICEPS TENODESIS Right 08/07/2019   Procedure: SHOULDER ARTHROSCOPY WITH DEBRIDEMENT, DECOMPRESSION, ROTATOR CUFF REPAIR AND BICEPS TENODESIS. - RNFA;  Surgeon: Corky Mull, MD;  Location: [facility];  Service: Orthopedics;  Laterality: Right;   TOE SURGERY Right    has pin and plate in it   TRANSURETHRAL RESECTION OF BLADDER TUMOR N/A 05/02/2022   Procedure: TRANSURETHRAL RESECTION OF BLADDER TUMOR (TURBT);  Surgeon: Hollice Espy, MD;  Location: [facility];  Service: Urology;  Laterality: N/A;   VIDEO BRONCHOSCOPY WITH ENDOBRONCHIAL ULTRASOUND N/A 03/02/2022   Procedure: VIDEO BRONCHOSCOPY WITH ENDOBRONCHIAL ULTRASOUND;  Surgeon: Tyler Pita, MD;  Location: [facility];  Service: Cardiopulmonary;  Laterality: N/A;    FAMILY HISTORY: Family History  Problem Relation Age of Onset   Breast cancer Cousin 42       bilateral at 42 and 66; daughter of maternal aunt who was unaffected   Lung cancer Maternal Aunt        dx 67s; deceased 15s; smoker   Heart attack Father        deceased 71   Stomach cancer Maternal Grandmother     ADVANCED DIRECTIVES (Y/N):  N  HEALTH MAINTENANCE: Social History   Tobacco Use   Smoking status: Former    Packs/day: 1.00    Years:  30.00    Total pack years: 30.00    Types: Cigarettes    Quit date: 09/13/2015    Years since quitting: 6.8   Smokeless tobacco: Never  Vaping Use   Vaping Use: Never used  Substance Use Topics   Alcohol use: Yes    Comment: occasional   Drug use: No     Colonoscopy:  PAP:  Bone density:  Lipid panel:  Allergies  Allergen Reactions   Other     BANDAIDS-OF LEFT ON FOR AN EXTENDED PERIOD OF TIME  Patient states latex rubber gloves are ok    Current Outpatient Medications  Medication Sig Dispense Refill   albuterol (PROVENTIL) (2.5 MG/3ML) 0.083% nebulizer solution Take 3 mLs (2.5 mg total) by nebulization every 6 (six) hours as needed for wheezing or shortness of breath. 125 mL 2   albuterol (VENTOLIN HFA) 108 (90 Base) MCG/ACT inhaler Inhale 1-2 puffs into the lungs every 6 (six) hours as needed for wheezing or shortness of breath. 1 g 1   alendronate (FOSAMAX) 70 MG tablet Take 1 tablet (70 mg total) by mouth once a week. Take with a full glass of water on an empty stomach. Stand or sit upright for 1 hour after taking (Patient taking differently: Take 70 mg by mouth once a week. Take with a full glass of water on an empty stomach. Stand or sit upright for 1 hour after taking WEDNESDAY) 12 tablet 1   ALPRAZolam (XANAX) 0.25 MG tablet TAKE (1) TABLET BY MOUTH DAILY AT BEDTIME AS NEEDED FOR ANXIETY 30 tablet 1   anastrozole (ARIMIDEX) 1 MG tablet Take 1 tablet (1 mg total) by mouth at bedtime. 30 tablet 10   aspirin-acetaminophen-caffeine (EXCEDRIN MIGRAINE) 737-106-26 MG tablet Take by mouth every 6 (six) hours as needed for headache.     azithromycin (ZITHROMAX Z-PAK) 250 MG tablet Take 2 tablets on day 1, and one tablet from days 2-5 6 each 0   cefdinir (OMNICEF) 300 MG capsule Take 1 capsule (300 mg total) by mouth 2 (two) times daily for 10 days. 20 capsule 0   fluconazole (DIFLUCAN) 150 MG tablet Take 1 tab po once and may repeat in 3 days if symptoms persist 3 tablet 0    fluticasone (FLONASE) 50 MCG/ACT nasal spray Place 2 sprays into both nostrils daily as needed for allergies. 60 mL 1   fluticasone-salmeterol (ADVAIR) 250-50 MCG/ACT AEPB Inhale 1 puff into the lungs in the morning and at bedtime. 60 each 5   folic acid (FOLVITE) 1 MG tablet Take 2 mg by mouth in the morning.     gabapentin (NEURONTIN) 300 MG  capsule Take 1 capsule (300 mg total) by mouth 2 (two) times daily. 60 capsule 6   HYDROcodone-acetaminophen (NORCO/VICODIN) 5-325 MG tablet Take 1-2 tablets by mouth every 6 (six) hours as needed for moderate pain. 10 tablet 0   magnesium oxide (MAG-OX) 400 (240 Mg) MG tablet Take 1 tablet (400 mg total) by mouth daily. 30 tablet 0   methotrexate (RHEUMATREX) 2.5 MG tablet Take 20 mg by mouth once a week. WEDNESDAY     montelukast (SINGULAIR) 10 MG tablet Take 1 tablet (10 mg total) by mouth at bedtime. 90 tablet 1   OLANZapine (ZYPREXA) 10 MG tablet Take 1 tablet (10 mg total) by mouth at bedtime. For nausea 30 tablet 0   ondansetron (ZOFRAN) 8 MG tablet Take 1 tablet (8 mg total) by mouth every 8 (eight) hours as needed for nausea or vomiting. 60 tablet 0   oxybutynin (DITROPAN) 5 MG tablet Take 1 tablet (5 mg total) by mouth every 8 (eight) hours as needed for bladder spasms. 30 tablet 0   pantoprazole (PROTONIX) 40 MG tablet Take 1 tablet (40 mg total) by mouth daily. 30 tablet 3   potassium chloride (KLOR-CON) 10 MEQ tablet TAKE (1) TABLET BY MOUTH EVERY DAY 90 tablet 1   rosuvastatin (CRESTOR) 40 MG tablet Take 40 mg by mouth every evening.     sertraline (ZOLOFT) 100 MG tablet TAKE (1) TABLET BY MOUTH EVERY DAY 90 tablet 1   simethicone (MYLICON) 314 MG chewable tablet Chew 125 mg by mouth every 6 (six) hours as needed for flatulence.     traMADol (ULTRAM) 50 MG tablet TAKE (1) TABLET BY MOUTH THREE TIMES A DAY AS NEEDED     traMADol (ULTRAM) 50 MG tablet Take 1-2 tablets (50-100 mg total) by mouth 3 (three) times daily as needed. 60 tablet 0   No  current facility-administered medications for this visit.    OBJECTIVE: Vitals:   07/12/22 1003  BP: 132/84  Pulse: 95  Resp: 16  Temp: (!) 97 F (36.1 C)  SpO2: 97%     Body mass index is 27.34 kg/m.    ECOG FS:0 - Asymptomatic  General: Well-developed, well-nourished, no acute distress. Eyes: Pink conjunctiva, anicteric sclera. HEENT: Normocephalic, moist mucous membranes. Lungs: No audible wheezing or coughing. Heart: Regular rate and rhythm. Abdomen: Soft, nontender, no obvious distention. Musculoskeletal: No edema, cyanosis, or clubbing. Neuro: Alert, answering all questions appropriately. Cranial nerves grossly intact. Skin: No rashes or petechiae noted. Psych: Normal affect.  LAB RESULTS:  Lab Results  Component Value Date   NA 136 07/12/2022   K 4.2 07/12/2022   CL 104 07/12/2022   CO2 24 07/12/2022   GLUCOSE 112 (H) 07/12/2022   BUN 17 07/12/2022   CREATININE 0.81 07/12/2022   CALCIUM 8.7 (L) 07/12/2022   PROT 6.6 07/12/2022   ALBUMIN 3.5 07/12/2022   AST 19 07/12/2022   ALT 10 07/12/2022   ALKPHOS 57 07/12/2022   BILITOT 0.2 (L) 07/12/2022   GFRNONAA >60 07/12/2022   GFRAA >60 04/09/2020    Lab Results  Component Value Date   WBC 5.9 07/12/2022   NEUTROABS 3.2 07/12/2022   HGB 8.9 (L) 07/12/2022   HCT 28.5 (L) 07/12/2022   MCV 96.3 07/12/2022   PLT 197 07/12/2022     STUDIES: CT Angio Chest PE W and/or Wo Contrast  Result Date: 07/07/2022 CLINICAL DATA:  Pulmonary embolism (PE) suspected, positive D-dimer SOB, cancer patient, s/p lobectomy EXAM: CT ANGIOGRAPHY CHEST WITH CONTRAST TECHNIQUE:  Multidetector CT imaging of the chest was performed using the standard protocol during bolus administration of intravenous contrast. Multiplanar CT image reconstructions and MIPs were obtained to evaluate the vascular anatomy. RADIATION DOSE REDUCTION: This exam was performed according to the departmental dose-optimization program which includes automated  exposure control, adjustment of the mA and/or kV according to patient size and/or use of iterative reconstruction technique. CONTRAST:  96m OMNIPAQUE IOHEXOL 350 MG/ML SOLN COMPARISON:  03/10/2022 FINDINGS: Cardiovascular: No filling defects in the pulmonary arteries to suggest pulmonary emboli. Heart is normal size. Aorta is normal caliber. Scattered coronary artery and aortic calcifications. Mediastinum/Nodes: No mediastinal, hilar, or axillary adenopathy. Trachea and esophagus are unremarkable. Thyroid unremarkable. Lungs/Pleura: Changes of right upper lobectomy. Loculated anterior right upper hemithorax pleural effusion. Small rind of pleural effusion throughout the rest of the right thorax. No confluent opacities otherwise. No effusions on the left. Upper Abdomen: No acute findings Musculoskeletal: Large fluid collection again seen within the right breast, stable since prior study, most likely postoperative seroma or lymphocele. No acute bony abnormality or suspicious focal bone lesion. Review of the MIP images confirms the above findings. IMPRESSION: Postoperative changes on the right with mild loculated pleural effusion. No evidence of pulmonary embolus. Coronary artery disease. Aortic Atherosclerosis (ICD10-I70.0). Electronically Signed   By: KRolm BaptiseM.D.   On: 07/07/2022 14:16   DG Chest 2 View  Result Date: 07/07/2022 CLINICAL DATA:  Short of breath.  Lobectomy for lung cancer EXAM: CHEST - 2 VIEW COMPARISON:  Chest 05/03/2022 FINDINGS: Right upper lobe ache density compatible postop lobectomy change, stable. Progression of right lower lobe airspace disease and small right pleural effusion. Pleural effusion appears partially loculated laterally. No pneumothorax. Left lung clear.  Heart size and vascularity normal. ACDF lower cervical spine. IMPRESSION: 1. Progression of right lower lobe airspace disease and small right pleural effusion. Possible pneumonia. 2. Postop changes right upper lobe.  Electronically Signed   By: CFranchot GalloM.D.   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  PLAN:    1.  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  She was also noted to have 1 of 33 lymph nodes positive for disease.  This increased her to a stage IIIa. MRI of the brain on April 21, 2022 reviewed independently with no obvious evidence of metastatic disease. Patient completed 4 cycles of adjuvant chemotherapy using cisplatin and Taxotere on June 22, 2022.  No intervention is needed at this time.  Return to clinic in 1 month with repeat imaging and further evaluation.   2. DCIS, right breast: Patient underwent lumpectomy followed by adjuvant XRT completing in September 2019.  Because there was no invasive component on her pathology, she did not require adjuvant chemotherapy. Patient could not tolerate tamoxifen or letrozole secondary to worsening joint pain, therefore was switched to anastrozole.  She is tolerating this well and will complete 5 years of treatment in September 2024.  Her most recent mammogram on March 11, 2022 was reported BI-RADS 1.  Repeat in June 2024. 3.  Osteopenia: Patient's most recent bone mineral density on March 25, 2021 revealed a T score of -2.3 which is slightly worse than previous where her T score was reported -1.9  and -1.4 and years prior.  Continue calcium, vitamin D, and alendronate.    4.  Reflux/abdominal pain: Will include CT abdomen and pelvis along with CT of chest as above.  Patient was also given a referral to GI.  She also has been instructed to reinitiate omeprazole.  I spent a total of 45 minutes reviewing chart data, face-to-face evaluation with the patient, counseling and coordination of care as  detailed above.\     Lloyd Huger, MD 07/13/2022 2:24 PM   Cancer Staging  Ductal carcinoma in situ (DCIS) of right breast Staging form: Breast, AJCC 8th Edition - Clinical: Stage 0 (cTis (DCIS), cN0, cM0, ER+, PR-, HER2-) - Signed by Lloyd Huger, MD on 03/18/2018 Nuclear grade: G3 Laterality: Right  Squamous cell carcinoma of right lung ([place]) Staging form: Lung, AJCC 8th Edition - Pathologic stage from 04/07/2022: Stage IIIA (pT3, pN1, cM0) - Signed by Lloyd Huger, MD on 04/07/2022

## 2022-07-13 ENCOUNTER — Encounter: Payer: Self-pay | Admitting: Oncology

## 2022-07-14 ENCOUNTER — Encounter: Payer: Self-pay | Admitting: Oncology

## 2022-07-14 NOTE — Telephone Encounter (Signed)
Signing encounter, see note 06/30/22

## 2022-07-18 ENCOUNTER — Ambulatory Visit
Admission: RE | Admit: 2022-07-18 | Discharge: 2022-07-18 | Disposition: A | Discharge status: Home / Self Care | Payer: Medicare HMO | Source: Ambulatory Visit | Attending: Radiation Oncology | Admitting: Radiation Oncology

## 2022-07-18 ENCOUNTER — Encounter: Payer: Self-pay | Admitting: Radiation Oncology

## 2022-07-18 VITALS — BP 134/76 | HR 92 | Temp 97.5°F | Resp 14 | Ht 60.0 in | Wt 137.1 lb

## 2022-07-18 DIAGNOSIS — Z79811 Long term (current) use of aromatase inhibitors: Secondary | ICD-10-CM | POA: Insufficient documentation

## 2022-07-18 DIAGNOSIS — Z923 Personal history of irradiation: Secondary | ICD-10-CM | POA: Insufficient documentation

## 2022-07-18 DIAGNOSIS — Z170 Estrogen receptor positive status [ER+]: Secondary | ICD-10-CM | POA: Insufficient documentation

## 2022-07-18 DIAGNOSIS — D0511 Intraductal carcinoma in situ of right breast: Secondary | ICD-10-CM | POA: Insufficient documentation

## 2022-07-18 NOTE — Progress Notes (Signed)
Radiation Oncology Follow up Note  Name: Michelle Moore   Date:   07/18/2022 MRN:  [id number] DOB: 1956/03/19    This 66 y.o. female presents to the clinic today for 4-year follow-up status post whole breast radiation to her right breast for ER/PR positive ductal carcinoma in situ.  REFERRING PROVIDER: Lavera Guise, MD  HPI: Patient is a 66 year old female now out over 4 years having completed whole breast radiation to her right breast for ER/PR positive and ductal carcinoma in situ.  Seen today in routine follow-up she is doing well.  She specifically denies breast tenderness cough or bone pain..  She is currently on Arimidex tolerating it well without side effect.  Patient had mammograms back in June which I have reviewed were BI-RADS 1 negative.  COMPLICATIONS OF TREATMENT: none  FOLLOW UP COMPLIANCE: keeps appointments   PHYSICAL EXAM:  BP 134/76   Pulse 92   Temp (!) 97.5 F (36.4 C)   Resp 14   Ht 5' (1.524 m)   Wt 137 lb 1.6 oz (62.2 kg)   BMI 26.78 kg/m  Lungs are clear to A&P cardiac examination essentially unremarkable with regular rate and rhythm. No dominant mass or nodularity is noted in either breast in 2 positions examined. Incision is well-healed. No axillary or supraclavicular adenopathy is appreciated. Cosmetic result is excellent.  Well-developed well-nourished patient in NAD. HEENT reveals PERLA, EOMI, discs not visualized.  Oral cavity is clear. No oral mucosal lesions are identified. Neck is clear without evidence of cervical or supraclavicular adenopathy. Lungs are clear to A&P. Cardiac examination is essentially unremarkable with regular rate and rhythm without murmur rub or thrill. Abdomen is benign with no organomegaly or masses noted. Motor sensory and DTR levels are equal and symmetric in the upper and lower extremities. Cranial nerves II through XII are grossly intact. Proprioception is intact. No peripheral adenopathy or edema is identified. No motor or sensory  levels are noted. Crude visual fields are within normal range.  RADIOLOGY RESULTS: Mammograms reviewed compatible with above-stated findings  PLAN: Present time patient is doing well now out over 4 years with no evidence of disease.  On pleased with her overall progress.  That she is out over 4 years I will turn follow-up care over to medical oncology.  I be happy to reevaluate the patient anytime should that be indicated.  I would like to take this opportunity to thank you for allowing me to participate in the care of your patient.Noreene Filbert, MD

## 2022-07-19 DIAGNOSIS — G4733 Obstructive sleep apnea (adult) (pediatric): Secondary | ICD-10-CM | POA: Diagnosis not present

## 2022-07-19 DIAGNOSIS — I2510 Atherosclerotic heart disease of native coronary artery without angina pectoris: Secondary | ICD-10-CM | POA: Diagnosis not present

## 2022-07-19 DIAGNOSIS — E782 Mixed hyperlipidemia: Secondary | ICD-10-CM | POA: Diagnosis not present

## 2022-07-19 DIAGNOSIS — Z9889 Other specified postprocedural states: Secondary | ICD-10-CM | POA: Diagnosis not present

## 2022-07-25 ENCOUNTER — Encounter: Payer: Self-pay | Admitting: Physician Assistant

## 2022-07-25 ENCOUNTER — Ambulatory Visit (INDEPENDENT_AMBULATORY_CARE_PROVIDER_SITE_OTHER): Payer: Medicare HMO | Admitting: Physician Assistant

## 2022-07-25 VITALS — BP 123/68 | HR 77 | Temp 98.0°F | Resp 16 | Ht 60.0 in | Wt 137.0 lb

## 2022-07-25 DIAGNOSIS — C3491 Malignant neoplasm of unspecified part of right bronchus or lung: Secondary | ICD-10-CM

## 2022-07-25 DIAGNOSIS — Z23 Encounter for immunization: Secondary | ICD-10-CM

## 2022-07-25 DIAGNOSIS — F411 Generalized anxiety disorder: Secondary | ICD-10-CM

## 2022-07-25 DIAGNOSIS — F320 Major depressive disorder, single episode, mild: Secondary | ICD-10-CM | POA: Diagnosis not present

## 2022-07-25 DIAGNOSIS — Z0001 Encounter for general adult medical examination with abnormal findings: Secondary | ICD-10-CM | POA: Diagnosis not present

## 2022-07-25 DIAGNOSIS — R300 Dysuria: Secondary | ICD-10-CM

## 2022-07-25 MED ORDER — ALPRAZOLAM 0.25 MG PO TABS: ORAL_TABLET | ORAL | 1 refills | Status: DC

## 2022-07-25 MED ORDER — SERTRALINE HCL 100 MG PO TABS: ORAL_TABLET | ORAL | 1 refills | Status: DC

## 2022-07-25 NOTE — Progress Notes (Signed)
[facility] [place], [place] [address]  Internal MEDICINE  Office Visit Note  Patient Name: Michelle Moore  [id number]  [id number]  Date of Service: 08/02/2022  Chief Complaint  Patient presents with   Medicare Wellness   Depression   Hypertension   Hyperlipidemia   Gastroesophageal Reflux     HPI Pt is here for routine health maintenance examination -Starting to get up 2-3 times at night since being on chemo. Does see urologist on Wednesday -having more trouble sleeping and reports her oncologist suggested taking 2 xanax at night as needed -She has a scan on 11/28, 4 weeks since last treatment. Going to do this earlier than usual to check acid reflux as well since this got worse during treatment.  -Did see Dr. Nehemiah Massed for cardiology follow up and everything went well -requests flu shot today -UTD on mammogram and colonoscopy  Current Medication: Outpatient Encounter Medications as of 07/25/2022  Medication Sig   albuterol (PROVENTIL) (2.5 MG/3ML) 0.083% nebulizer solution Take 3 mLs (2.5 mg total) by nebulization every 6 (six) hours as needed for wheezing or shortness of breath.   albuterol (VENTOLIN HFA) 108 (90 Base) MCG/ACT inhaler Inhale 1-2 puffs into the lungs every 6 (six) hours as needed for wheezing or shortness of breath.   alendronate (FOSAMAX) 70 MG tablet Take 1 tablet (70 mg total) by mouth once a week. Take with a full glass of water on an empty stomach. Stand or sit upright for 1 hour after taking (Patient taking differently: Take 70 mg by mouth once a week. Take with a full glass of water on an empty stomach. Stand or sit upright for 1 hour after taking WEDNESDAY)   anastrozole (ARIMIDEX) 1 MG tablet Take 1 tablet (1 mg total) by mouth at bedtime.   aspirin-acetaminophen-caffeine (EXCEDRIN MIGRAINE) 250-250-65 MG tablet Take by mouth every 6 (six) hours as needed for headache.   azithromycin (ZITHROMAX Z-PAK) 250 MG tablet Take 2  tablets on day 1, and one tablet from days 2-5   fluconazole (DIFLUCAN) 150 MG tablet Take 1 tab po once and may repeat in 3 days if symptoms persist   fluticasone (FLONASE) 50 MCG/ACT nasal spray Place 2 sprays into both nostrils daily as needed for allergies.   fluticasone-salmeterol (ADVAIR) 250-50 MCG/ACT AEPB Inhale 1 puff into the lungs in the morning and at bedtime.   folic acid (FOLVITE) 1 MG tablet Take 2 mg by mouth in the morning.   gabapentin (NEURONTIN) 300 MG capsule Take 1 capsule (300 mg total) by mouth 2 (two) times daily.   HYDROcodone-acetaminophen (NORCO/VICODIN) 5-325 MG tablet Take 1-2 tablets by mouth every 6 (six) hours as needed for moderate pain.   magnesium oxide (MAG-OX) 400 (240 Mg) MG tablet Take 1 tablet (400 mg total) by mouth daily.   methotrexate (RHEUMATREX) 2.5 MG tablet Take 20 mg by mouth once a week. WEDNESDAY   OLANZapine (ZYPREXA) 10 MG tablet Take 1 tablet (10 mg total) by mouth at bedtime. For nausea   ondansetron (ZOFRAN) 8 MG tablet Take 1 tablet (8 mg total) by mouth every 8 (eight) hours as needed for nausea or vomiting.   oxybutynin (DITROPAN) 5 MG tablet Take 1 tablet (5 mg total) by mouth every 8 (eight) hours as needed for bladder spasms.   pantoprazole (PROTONIX) 40 MG tablet Take 1 tablet (40 mg total) by mouth daily.   potassium chloride (KLOR-CON) 10 MEQ tablet TAKE (1) TABLET BY MOUTH EVERY DAY  rosuvastatin (CRESTOR) 40 MG tablet Take 40 mg by mouth every evening.   simethicone (MYLICON) 500 MG chewable tablet Chew 125 mg by mouth every 6 (six) hours as needed for flatulence.   traMADol (ULTRAM) 50 MG tablet TAKE (1) TABLET BY MOUTH THREE TIMES A DAY AS NEEDED   traMADol (ULTRAM) 50 MG tablet Take 1-2 tablets (50-100 mg total) by mouth 3 (three) times daily as needed.   [DISCONTINUED] ALPRAZolam (XANAX) 0.25 MG tablet TAKE (1) TABLET BY MOUTH DAILY AT BEDTIME AS NEEDED FOR ANXIETY   [DISCONTINUED] montelukast (SINGULAIR) 10 MG tablet Take 1  tablet (10 mg total) by mouth at bedtime.   [DISCONTINUED] sertraline (ZOLOFT) 100 MG tablet TAKE (1) TABLET BY MOUTH EVERY DAY   ALPRAZolam (XANAX) 0.25 MG tablet TAKE 1-2 TABLET BY MOUTH DAILY AT BEDTIME AS NEEDED FOR ANXIETY   sertraline (ZOLOFT) 100 MG tablet TAKE (1) TABLET BY MOUTH EVERY DAY   [DISCONTINUED] prochlorperazine (COMPAZINE) 10 MG tablet Take 1 tablet (10 mg total) by mouth every 6 (six) hours as needed (Nausea or vomiting).   No facility-administered encounter medications on file as of 07/25/2022.    Surgical History: Past Surgical History:  Procedure Laterality Date   ABDOMINAL HYSTERECTOMY     ANTERIOR CERVICAL DECOMP/DISCECTOMY FUSION N/A 01/20/2014   Procedure: ANTERIOR CERVICAL DECOMPRESSION/DISCECTOMY FUSION 2 LEVELS cervical five/six six [place];  Surgeon: Ophelia Charter, MD;  Location: [place] [facility];  Service: Neurosurgery;  Laterality: N/A;   APPENDECTOMY     BACK SURGERY     lumbar   BLADDER INSTILLATION N/A 05/02/2022   Procedure: BLADDER INSTILLATION OF GEMCITABINE;  Surgeon: Hollice Espy, MD;  Location: [facility];  Service: Urology;  Laterality: N/A;   BREAST BIOPSY Right 02/01/2018   x shape, DCIS    BREAST BIOPSY  01/16/2019   coil, BENIGN MAMMARY TISSUE WITH MODERATE STROMAL FIBROSIS AND COARSE DYSTROPHIC CALCIFICATIONS of the RIGHT breast   BREAST LUMPECTOMY Right 03/07/2018   Procedure: BREAST LUMPECTOMY/ RE EXCISION;  Surgeon: Herbert Pun, MD;  Location: [place] [facility];  Service: General;  Laterality: Right;   CARDIAC CATHETERIZATION Left 07/31/2014   Procedure: CARDIAC CATHETERIZATION; Location: [facility]; Surgeon: Serafina Royals, MD   COLONOSCOPY WITH PROPOFOL N/A 02/11/2021   Procedure: COLONOSCOPY WITH PROPOFOL;  Surgeon: Lucilla Lame, MD;  Location: [place];  Service: Endoscopy;  Laterality: N/A;  Requests Early   CORONARY ANGIOPLASTY WITH STENT PLACEMENT Left 10/16/2009   Procedure: CORONARY ANGIOPLASTY WITH STENT PLACEMENT;  Location: [place]; Surgeon: Katrine Coho, MD   CYSTOSCOPY W/ RETROGRADES Bilateral 05/02/2022   Procedure: CYSTOSCOPY WITH RETROGRADE PYELOGRAM;  Surgeon: Hollice Espy, MD;  Location: [facility];  Service: Urology;  Laterality: Bilateral;   INTERCOSTAL NERVE BLOCK Right 03/17/2022   Procedure: INTERCOSTAL NERVE BLOCK;  Surgeon: Melrose Nakayama, MD;  Location: [place];  Service: Thoracic;  Laterality: Right;   LASIK Bilateral    LUNG LOBECTOMY Right 03/17/2022   right upper lobe   LYMPH NODE DISSECTION Right 03/17/2022   Procedure: LYMPH NODE DISSECTION;  Surgeon: Melrose Nakayama, MD;  Location: [place];  Service: Thoracic;  Laterality: Right;   PARTIAL MASTECTOMY WITH NEEDLE LOCALIZATION Right 02/21/2018   Procedure: PARTIAL MASTECTOMY WITH NEEDLE LOCALIZATION;  Surgeon: Herbert Pun, MD;  Location: [facility];  Service: General;  Laterality: Right;   POLYPECTOMY N/A 02/11/2021   Procedure: POLYPECTOMY;  Surgeon: [place]illa Lame, MD;  Location: [place];  Service: Endoscopy;  Laterality: N/A;   ROTATOR CUFF REPAIR Left    SHOULDER  ARTHROSCOPY WITH ROTATOR CUFF REPAIR AND OPEN BICEPS TENODESIS Right 08/07/2019   Procedure: SHOULDER ARTHROSCOPY WITH DEBRIDEMENT, DECOMPRESSION, ROTATOR CUFF REPAIR AND BICEPS TENODESIS. - RNFA;  Surgeon: Corky Mull, MD;  Location: [facility];  Service: Orthopedics;  Laterality: Right;   TOE SURGERY Right    has pin and plate in it   TRANSURETHRAL RESECTION OF BLADDER TUMOR N/A 05/02/2022   Procedure: TRANSURETHRAL RESECTION OF BLADDER TUMOR (TURBT);  Surgeon: Hollice Espy, MD;  Location: [facility];  Service: Urology;  Laterality: N/A;   VIDEO BRONCHOSCOPY WITH ENDOBRONCHIAL ULTRASOUND N/A 03/02/2022   Procedure: VIDEO BRONCHOSCOPY WITH ENDOBRONCHIAL ULTRASOUND;  Surgeon: Tyler Pita, MD;  Location: [facility];  Service: Cardiopulmonary;  Laterality: N/A;    Medical History: Past Medical History:  Diagnosis Date   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   Anxiety    a.) uses BZO (alprazolam) PRN   Aortic atherosclerosis (HCC)    Arthritis    Bladder mass 12/07/2021   a.) CT abd 12/07/2021 --> 9 x 8 mm nodular enhancing focus at the base of the bladder; suspicious for urothelial neoplasm.   COPD (chronic obstructive pulmonary disease) (HCC)    Coronary artery disease 10/16/2009   a.) LHC/PCI 10/16/2009: EF 60%; 50% OM1, 99% dRCA (3.0 x 18 mm Xience V DES). b.) LHC 07/31/2014: EF 65%; 40% pLAD, 20% pLCx, 40% OM1, 30% oRCA, 50% pRCA, 20% mRCA, 10% ISR stent to dRCA; further intervention deferred opting for med. mgmt.   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 b.) s/p lumpectomy, adjuvant XRT; currently on extended AI (anastrozole) therapy   Essential hypertension    GERD (gastroesophageal reflux disease)    HLD (hyperlipidemia)    Left thyroid nodule 12/07/2021   a.) CT chest; measured 2.6 cm   Long term current use of immunosuppressive drug    a.) MTX for RA.   OSA on CPAP    Osteopenia    Personal history of radiation therapy    Pneumonia    PRES (posterior reversible encephalopathy syndrome) 07/14/2014   Pulmonary nodules 11/08/2021   a.) CXR 11/08/2021: 38m nodular density RUL. b.) CT chest 12/07/2021: 1.9 x 1.4 x 2.5 spiculated lesion anterior RUL. c.) PET CT 01/06/2022: 2.2 x 1.9 spiculated anterior RUL mass (SUV max 16.4); d.) Bx (+) NSCLC, favoring SCC with tumor necrosis; p40/CK7/GATA3 (+), TTF-1 (-)   Rheumatoid arthritis ([facility]    a.) on MTX   Seasonal allergies    Seizures ([place] 2014   a.) x 1 episode; etiology never determined.   Sinus headache    Squamous cell lung cancer, right ([facility] 03/17/2022   a.) RUL lobectomy 03/17/2022 --> Bx (+) for invasive moderately differentiated SCC of the  RIGHT upper lobe (PDL-1: 5%) --> stage IIIA SCC (pT3, pN1, cM0)   ST elevation myocardial infarction (STEMI) of inferior wall ([facility] 10/16/2009   a.) LHC/PCI 10/16/2009: EF 60%; 50% OM1, 99% dRCA (3.0 x 18 mm Xience V DES).   Tubular adenoma    Valvular heart disease     Family History: Family History  Problem Relation Age of Onset   Breast cancer Cousin 575      bilateral at 550and 769 daughter of maternal aunt who was unaffected  Lung cancer Maternal Aunt        dx 58s; deceased 73s; smoker   Heart attack Father        deceased 10   Stomach cancer Maternal Grandmother       Review of Systems  Constitutional:  Negative for chills, fatigue and unexpected weight change.  HENT:  Negative for congestion, rhinorrhea, sneezing and sore throat.   Eyes:  Negative for redness.  Respiratory:  Negative for cough, chest tightness and shortness of breath.   Cardiovascular:  Negative for chest pain and palpitations.  Gastrointestinal:  Negative for abdominal pain, constipation, diarrhea, nausea and vomiting.  Genitourinary:  Negative for dysuria and frequency.  Musculoskeletal:  Negative for arthralgias, back pain, joint swelling and neck pain.  Skin:  Negative for rash.  Neurological: Negative.  Negative for tremors and numbness.  Hematological:  Negative for adenopathy. Does not bruise/bleed easily.  Psychiatric/Behavioral:  Positive for behavioral problems (Depression) and sleep disturbance. Negative for self-injury and suicidal ideas. The patient is nervous/anxious.      Vital Signs: BP 123/68   Pulse 77   Temp 98 F (36.7 C)   Resp 16   Ht 5' (1.524 m)   Wt 137 lb (62.1 kg)   SpO2 97%   BMI 26.76 kg/m    Physical Exam Vitals reviewed.  Constitutional:      General: She is not in acute distress.    Appearance: Normal appearance. She is not ill-appearing.  HENT:     Head: Normocephalic and atraumatic.     Right Ear: Tympanic membrane normal.     Left Ear: Tympanic  membrane normal.  Eyes:     Pupils: Pupils are equal, round, and reactive to light.  Cardiovascular:     Rate and Rhythm: Normal rate and regular rhythm.  Pulmonary:     Effort: Pulmonary effort is normal. No respiratory distress.  Abdominal:     General: Abdomen is flat.     Tenderness: There is no abdominal tenderness.  Musculoskeletal:        General: Normal range of motion.     Cervical back: Normal range of motion.  Skin:    General: Skin is warm and dry.  Neurological:     Mental Status: She is alert and oriented to person, place, and time.  Psychiatric:        Mood and Affect: Mood normal.        Behavior: Behavior normal.      LABS: Recent Results (from the past 2160 hour(s))  CBC with Differential     Status: Abnormal   Collection Time: 05/10/22  8:48 AM  Result Value Ref Range   WBC 7.0 4.0 - 10.5 K/uL   RBC 3.99 3.87 - 5.11 MIL/uL   Hemoglobin 11.4 (L) 12.0 - 15.0 g/dL   HCT 36.6 36.0 - 46.0 %   MCV 91.7 80.0 - 100.0 fL   MCH 28.6 26.0 - 34.0 pg   MCHC 31.1 30.0 - 36.0 g/dL   RDW 14.5 11.5 - 15.5 %   Platelets 220 150 - 400 K/uL   nRBC 0.0 0.0 - 0.2 %   Neutrophils Relative % 62 %   Neutro Abs 4.4 1.7 - 7.7 K/uL   Lymphocytes Relative 26 %   Lymphs Abs 1.8 0.7 - 4.0 K/uL   Monocytes Relative 10 %   Monocytes Absolute 0.7 0.1 - 1.0 K/uL   Eosinophils Relative 0 %   Eosinophils Absolute 0.0 0.0 - 0.5 K/uL  Basophils Relative 1 %   Basophils Absolute 0.1 0.0 - 0.1 K/uL   Immature Granulocytes 1 %   Abs Immature Granulocytes 0.05 0.00 - 0.07 K/uL    Comment: Performed at [facility], [address] [place], [place] [address]  Comprehensive metabolic panel     Status: Abnormal   Collection Time: 05/10/22  8:48 AM  Result Value Ref Range   Sodium 138 135 - 145 mmol/L   Potassium 4.1 3.5 - 5.1 mmol/L   Chloride 105 98 - 111 mmol/L   CO2 27 22 - 32 mmol/L   Glucose, Bld 114 (H) 70 - 99 mg/dL    Comment: Glucose reference range applies only to  samples taken after fasting for at least 8 hours.   BUN 13 8 - 23 mg/dL   Creatinine, Ser 0.69 0.44 - 1.00 mg/dL   Calcium 8.8 (L) 8.9 - 10.3 mg/dL   Total Protein 6.8 6.5 - 8.1 g/dL   Albumin 3.7 3.5 - 5.0 g/dL   AST 16 15 - 41 U/L   ALT 11 0 - 44 U/L   Alkaline Phosphatase 97 38 - 126 U/L   Total Bilirubin 0.2 (L) 0.3 - 1.2 mg/dL   GFR, Estimated >60 >60 mL/min    Comment: (NOTE) Calculated using the CKD-EPI Creatinine Equation (2021)    Anion gap 6 5 - 15    Comment: Performed at [facility], [address] [place], [place] [address]  Magnesium     Status: None   Collection Time: 05/10/22  8:48 AM  Result Value Ref Range   Magnesium 1.7 1.7 - 2.4 mg/dL    Comment: Performed at [facility], [address] [place], [place] [address]  Comprehensive metabolic panel     Status: Abnormal   Collection Time: 05/17/22 10:46 AM  Result Value Ref Range   Sodium 135 135 - 145 mmol/L   Potassium 3.9 3.5 - 5.1 mmol/L   Chloride 100 98 - 111 mmol/L   CO2 24 22 - 32 mmol/L   Glucose, Bld 129 (H) 70 - 99 mg/dL    Comment: Glucose reference range applies only to samples taken after fasting for at least 8 hours.   BUN 19 8 - 23 mg/dL   Creatinine, Ser 0.88 0.44 - 1.00 mg/dL   Calcium 8.9 8.9 - 10.3 mg/dL   Total Protein 6.9 6.5 - 8.1 g/dL   Albumin 3.8 3.5 - 5.0 g/dL   AST 22 15 - 41 U/L   ALT 14 0 - 44 U/L   Alkaline Phosphatase 69 38 - 126 U/L   Total Bilirubin 0.4 0.3 - 1.2 mg/dL   GFR, Estimated >60 >60 mL/min    Comment: (NOTE) Calculated using the CKD-EPI Creatinine Equation (2021)    Anion gap 11 5 - 15    Comment: Performed at [facility], [address] [place], [place] [address]  CBC with Differential     Status: Abnormal   Collection Time: 05/17/22 10:46 AM  Result Value Ref Range   WBC 3.2 (L) 4.0 - 10.5 K/uL   RBC 4.11 3.87 - 5.11 MIL/uL   Hemoglobin 11.7 (L) 12.0 - 15.0 g/dL   HCT 37.0 36.0 - 46.0 %   MCV 90.0 80.0 - 100.0 fL   MCH 28.5  26.0 - 34.0 pg   MCHC 31.6 30.0 - 36.0 g/dL   RDW 15.4 11.5 - 15.5 %   Platelets 163 150 - 400 K/uL   nRBC  0.0 0.0 - 0.2 %   Neutrophils Relative % 58 %   Neutro Abs 1.9 1.7 - 7.7 K/uL   Lymphocytes Relative 34 %   Lymphs Abs 1.1 0.7 - 4.0 K/uL   Monocytes Relative 3 %   Monocytes Absolute 0.1 0.1 - 1.0 K/uL   Eosinophils Relative 2 %   Eosinophils Absolute 0.1 0.0 - 0.5 K/uL   Basophils Relative 2 %   Basophils Absolute 0.1 0.0 - 0.1 K/uL   RBC Morphology MORPHOLOGY UNREMARKABLE    Smear Review Normal platelet morphology     Comment: PLATELETS APPEAR ADEQUATE   Immature Granulocytes 1 %   Abs Immature Granulocytes 0.02 0.00 - 0.07 K/uL   Reactive, Benign Lymphocytes PRESENT     Comment: Performed at [facility], [address], [place], [place] [address]  Magnesium     Status: Abnormal   Collection Time: 05/17/22 10:46 AM  Result Value Ref Range   Magnesium 1.5 (L) 1.7 - 2.4 mg/dL    Comment: Performed at [facility], [address] [place], [place] [address]  Magnesium     Status: None   Collection Time: 05/31/22  9:05 AM  Result Value Ref Range   Magnesium 1.8 1.7 - 2.4 mg/dL    Comment: Performed at [facility], [address] [place], [place] [address]  Comprehensive metabolic panel     Status: Abnormal   Collection Time: 05/31/22  9:05 AM  Result Value Ref Range   Sodium 138 135 - 145 mmol/L   Potassium 4.0 3.5 - 5.1 mmol/L   Chloride 106 98 - 111 mmol/L   CO2 27 22 - 32 mmol/L   Glucose, Bld 113 (H) 70 - 99 mg/dL    Comment: Glucose reference range applies only to samples taken after fasting for at least 8 hours.   BUN 12 8 - 23 mg/dL   Creatinine, Ser 0.66 0.44 - 1.00 mg/dL   Calcium 8.5 (L) 8.9 - 10.3 mg/dL   Total Protein 6.9 6.5 - 8.1 g/dL   Albumin 3.5 3.5 - 5.0 g/dL   AST 17 15 - 41 U/L   ALT 11 0 - 44 U/L   Alkaline Phosphatase 68 38 - 126 U/L   Total Bilirubin 0.2 (L) 0.3 - 1.2 mg/dL   GFR, Estimated >60 >60 mL/min     Comment: (NOTE) Calculated using the CKD-EPI Creatinine Equation (2021)    Anion gap 5 5 - 15    Comment: Performed at [facility], [address] [place], [place] [address]  CBC with Differential     Status: Abnormal   Collection Time: 05/31/22  9:05 AM  Result Value Ref Range   WBC 8.0 4.0 - 10.5 K/uL   RBC 3.52 (L) 3.87 - 5.11 MIL/uL   Hemoglobin 10.3 (L) 12.0 - 15.0 g/dL   HCT 32.7 (L) 36.0 - 46.0 %   MCV 92.9 80.0 - 100.0 fL   MCH 29.3 26.0 - 34.0 pg   MCHC 31.5 30.0 - 36.0 g/dL   RDW 17.0 (H) 11.5 - 15.5 %   Platelets 189 150 - 400 K/uL   nRBC 0.0 0.0 - 0.2 %   Neutrophils Relative % 62 %   Neutro Abs 5.0 1.7 - 7.7 K/uL   Lymphocytes Relative 26 %   Lymphs Abs 2.1 0.7 - 4.0 K/uL   Monocytes Relative 9 %   Monocytes Absolute 0.7 0.1 - 1.0 K/uL   Eosinophils Relative 1 %  Eosinophils Absolute 0.0 0.0 - 0.5 K/uL   Basophils Relative 1 %   Basophils Absolute 0.1 0.0 - 0.1 K/uL   Immature Granulocytes 1 %   Abs Immature Granulocytes 0.06 0.00 - 0.07 K/uL    Comment: Performed at [facility], [address], [place], [place] [address]  Magnesium     Status: Abnormal   Collection Time: 06/07/22  8:04 AM  Result Value Ref Range   Magnesium 1.5 (L) 1.7 - 2.4 mg/dL    Comment: Performed at [facility], [address] [place], [place] [address]  Comprehensive metabolic panel     Status: Abnormal   Collection Time: 06/07/22  8:04 AM  Result Value Ref Range   Sodium 136 135 - 145 mmol/L   Potassium 4.0 3.5 - 5.1 mmol/L   Chloride 105 98 - 111 mmol/L   CO2 26 22 - 32 mmol/L   Glucose, Bld 117 (H) 70 - 99 mg/dL    Comment: Glucose reference range applies only to samples taken after fasting for at least 8 hours.   BUN 18 8 - 23 mg/dL   Creatinine, Ser 0.76 0.44 - 1.00 mg/dL   Calcium 8.4 (L) 8.9 - 10.3 mg/dL   Total Protein 6.8 6.5 - 8.1 g/dL   Albumin 3.7 3.5 - 5.0 g/dL   AST 19 15 - 41 U/L   ALT 12 0 - 44 U/L   Alkaline Phosphatase 59 38 - 126  U/L   Total Bilirubin 0.4 0.3 - 1.2 mg/dL   GFR, Estimated >60 >60 mL/min    Comment: (NOTE) Calculated using the CKD-EPI Creatinine Equation (2021)    Anion gap 5 5 - 15    Comment: Performed at [facility], [address] [place], [place] [address]  CBC with Differential     Status: Abnormal   Collection Time: 06/07/22  8:04 AM  Result Value Ref Range   WBC 3.0 (L) 4.0 - 10.5 K/uL   RBC 3.68 (L) 3.87 - 5.11 MIL/uL   Hemoglobin 10.8 (L) 12.0 - 15.0 g/dL   HCT 33.5 (L) 36.0 - 46.0 %   MCV 91.0 80.0 - 100.0 fL   MCH 29.3 26.0 - 34.0 pg   MCHC 32.2 30.0 - 36.0 g/dL   RDW 17.1 (H) 11.5 - 15.5 %   Platelets 177 150 - 400 K/uL   nRBC 0.0 0.0 - 0.2 %   Neutrophils Relative % 56 %   Neutro Abs 1.7 1.7 - 7.7 K/uL   Lymphocytes Relative 36 %   Lymphs Abs 1.1 0.7 - 4.0 K/uL   Monocytes Relative 2 %   Monocytes Absolute 0.1 0.1 - 1.0 K/uL   Eosinophils Relative 3 %   Eosinophils Absolute 0.1 0.0 - 0.5 K/uL   Basophils Relative 2 %   Basophils Absolute 0.1 0.0 - 0.1 K/uL   WBC Morphology PELGEROID CHANGES     Comment: DIFF. CONFIRMED BY SMEAR   RBC Morphology MORPHOLOGY UNREMARKABLE    Smear Review Normal platelet morphology     Comment: PLATELETS APPEAR ADEQUATE   Immature Granulocytes 1 %   Abs Immature Granulocytes 0.02 0.00 - 0.07 K/uL    Comment: Performed at [facility], [address] [place], [place] [address]  Magnesium     Status: None   Collection Time: 06/21/22  8:41 AM  Result Value Ref Range   Magnesium 1.8 1.7 - 2.4 mg/dL    Comment: Performed at [facility], [address] [place], [place] [address]  Comprehensive metabolic panel     Status: Abnormal   Collection Time: 06/21/22  8:41 AM  Result Value Ref Range   Sodium 140 135 - 145 mmol/L   Potassium 4.3 3.5 - 5.1 mmol/L   Chloride 107 98 - 111 mmol/L   CO2 28 22 - 32 mmol/L   Glucose, Bld 108 (H) 70 - 99 mg/dL    Comment: Glucose reference range applies only to samples taken after  fasting for at least 8 hours.   BUN 12 8 - 23 mg/dL   Creatinine, Ser 0.66 0.44 - 1.00 mg/dL   Calcium 8.7 (L) 8.9 - 10.3 mg/dL   Total Protein 6.1 (L) 6.5 - 8.1 g/dL   Albumin 3.2 (L) 3.5 - 5.0 g/dL   AST 16 15 - 41 U/L   ALT 10 0 - 44 U/L   Alkaline Phosphatase 55 38 - 126 U/L   Total Bilirubin 0.3 0.3 - 1.2 mg/dL   GFR, Estimated >60 >60 mL/min    Comment: (NOTE) Calculated using the CKD-EPI Creatinine Equation (2021)    Anion gap 5 5 - 15    Comment: Performed at [facility], [address] [place], [place] [address]  CBC with Differential     Status: Abnormal   Collection Time: 06/21/22  8:41 AM  Result Value Ref Range   WBC 5.8 4.0 - 10.5 K/uL   RBC 3.19 (L) 3.87 - 5.11 MIL/uL   Hemoglobin 9.5 (L) 12.0 - 15.0 g/dL   HCT 30.2 (L) 36.0 - 46.0 %   MCV 94.7 80.0 - 100.0 fL   MCH 29.8 26.0 - 34.0 pg   MCHC 31.5 30.0 - 36.0 g/dL   RDW 19.9 (H) 11.5 - 15.5 %   Platelets 186 150 - 400 K/uL   nRBC 0.0 0.0 - 0.2 %   Neutrophils Relative % 63 %   Neutro Abs 3.7 1.7 - 7.7 K/uL   Lymphocytes Relative 25 %   Lymphs Abs 1.4 0.7 - 4.0 K/uL   Monocytes Relative 10 %   Monocytes Absolute 0.6 0.1 - 1.0 K/uL   Eosinophils Relative 0 %   Eosinophils Absolute 0.0 0.0 - 0.5 K/uL   Basophils Relative 1 %   Basophils Absolute 0.1 0.0 - 0.1 K/uL   Immature Granulocytes 1 %   Abs Immature Granulocytes 0.03 0.00 - 0.07 K/uL    Comment: Performed at [facility], [address] [place], [place] [address]  Urinalysis, Complete w Microscopic     Status: Abnormal   Collection Time: 06/21/22  9:05 AM  Result Value Ref Range   Color, Urine YELLOW (A) YELLOW   APPearance HAZY (A) CLEAR   Specific Gravity, Urine 1.013 1.005 - 1.030   pH 5.0 5.0 - 8.0   Glucose, UA NEGATIVE NEGATIVE mg/dL   Hgb urine dipstick NEGATIVE NEGATIVE   Bilirubin Urine NEGATIVE NEGATIVE   Ketones, ur NEGATIVE NEGATIVE mg/dL   Protein, ur NEGATIVE NEGATIVE mg/dL   Nitrite NEGATIVE NEGATIVE    Leukocytes,Ua TRACE (A) NEGATIVE   RBC / HPF 0-5 0 - 5 RBC/hpf   WBC, UA 6-10 0 - 5 WBC/hpf   Bacteria, UA NONE SEEN NONE SEEN   Squamous Epithelial / LPF 0-5 0 - 5    Comment: Performed at [facility], [address] [place], [place] [address]  Comprehensive metabolic panel     Status: Abnormal   Collection Time: 06/30/22 10:11 AM  Result Value Ref  Range   Sodium 137 135 - 145 mmol/L   Potassium 4.5 3.5 - 5.1 mmol/L   Chloride 107 98 - 111 mmol/L   CO2 22 22 - 32 mmol/L   Glucose, Bld 114 (H) 70 - 99 mg/dL    Comment: Glucose reference range applies only to samples taken after fasting for at least 8 hours.   BUN 19 8 - 23 mg/dL   Creatinine, Ser 0.95 0.44 - 1.00 mg/dL   Calcium 8.6 (L) 8.9 - 10.3 mg/dL   Total Protein 6.6 6.5 - 8.1 g/dL   Albumin 3.5 3.5 - 5.0 g/dL   AST 20 15 - 41 U/L   ALT 12 0 - 44 U/L   Alkaline Phosphatase 55 38 - 126 U/L   Total Bilirubin 0.4 0.3 - 1.2 mg/dL   GFR, Estimated >60 >60 mL/min    Comment: (NOTE) Calculated using the CKD-EPI Creatinine Equation (2021)    Anion gap 8 5 - 15    Comment: Performed at [facility], [address] [place], [place] [address]  CBC     Status: Abnormal   Collection Time: 06/30/22 10:11 AM  Result Value Ref Range   WBC 1.6 (L) 4.0 - 10.5 K/uL   RBC 3.00 (L) 3.87 - 5.11 MIL/uL   Hemoglobin 9.1 (L) 12.0 - 15.0 g/dL   HCT 28.9 (L) 36.0 - 46.0 %   MCV 96.3 80.0 - 100.0 fL   MCH 30.3 26.0 - 34.0 pg   MCHC 31.5 30.0 - 36.0 g/dL   RDW 19.8 (H) 11.5 - 15.5 %   Platelets 130 (L) 150 - 400 K/uL   nRBC 0.0 0.0 - 0.2 %    Comment: Performed at [facility], [address] [place], [place] [address]  Magnesium     Status: Abnormal   Collection Time: 06/30/22 10:11 AM  Result Value Ref Range   Magnesium 1.2 (L) 1.7 - 2.4 mg/dL    Comment: Performed at [facility], [address] [place], [place] [address]  Basic metabolic panel     Status: Abnormal   Collection Time: 07/07/22 12:03 PM   Result Value Ref Range   Sodium 138 135 - 145 mmol/L   Potassium 4.1 3.5 - 5.1 mmol/L   Chloride 107 98 - 111 mmol/L   CO2 23 22 - 32 mmol/L   Glucose, Bld 124 (H) 70 - 99 mg/dL    Comment: Glucose reference range applies only to samples taken after fasting for at least 8 hours.   BUN 12 8 - 23 mg/dL   Creatinine, Ser 0.88 0.44 - 1.00 mg/dL   Calcium 7.9 (L) 8.9 - 10.3 mg/dL   GFR, Estimated >60 >60 mL/min    Comment: (NOTE) Calculated using the CKD-EPI Creatinine Equation (2021)    Anion gap 8 5 - 15    Comment: Performed at [facility], [address] [place], [place] [address]  CBC     Status: Abnormal   Collection Time: 07/07/22 12:03 PM  Result Value Ref Range   WBC 3.9 (L) 4.0 - 10.5 K/uL   RBC 2.73 (L) 3.87 - 5.11 MIL/uL   Hemoglobin 8.1 (L) 12.0 - 15.0 g/dL   HCT 27.0 (L) 36.0 - 46.0 %   MCV 98.9 80.0 - 100.0 fL   MCH 29.7 26.0 - 34.0 pg   MCHC 30.0 30.0 - 36.0 g/dL   RDW 20.3 (H) 11.5 - 15.5 %   Platelets 140 (L) 150 -  400 K/uL   nRBC 0.0 0.0 - 0.2 %    Comment: Performed at [facility], [address] [place], [place] [address]  Brain natriuretic peptide     Status: None   Collection Time: 07/07/22 12:03 PM  Result Value Ref Range   B Natriuretic Peptide 82.3 0.0 - 100.0 pg/mL    Comment: Performed at [facility], [place], [place] [address]  Troponin I (High Sensitivity)     Status: None   Collection Time: 07/07/22 12:03 PM  Result Value Ref Range   Troponin I (High Sensitivity) 4 <18 ng/L    Comment: (NOTE) Elevated high sensitivity troponin I (hsTnI) values and significant  changes across serial measurements may suggest ACS but many other  chronic and acute conditions are known to elevate hsTnI results.  Refer to the "Links" section for chest pain algorithms and additional  guidance. Performed at [facility], [address] [place], [place] [address]   D-dimer, quantitative     Status:  Abnormal   Collection Time: 07/07/22  1:14 PM  Result Value Ref Range   D-Dimer, Quant 1.86 (H) 0.00 - 0.50 ug/mL-FEU    Comment: (NOTE) At the manufacturer cut-off value of 0.5 g/mL FEU, this assay has a negative predictive value of 95-100%.This assay is intended for use in conjunction with a clinical pretest probability (PTP) assessment model to exclude pulmonary embolism (PE) and deep venous thrombosis (DVT) in outpatients suspected of PE or DVT. Results should be correlated with clinical presentation. Performed at [facility], [address] [place], [place] [address]   Magnesium     Status: None   Collection Time: 07/12/22  9:46 AM  Result Value Ref Range   Magnesium 1.9 1.7 - 2.4 mg/dL    Comment: Performed at [facility], [address] [place], [place] [address]  Comprehensive metabolic panel     Status: Abnormal   Collection Time: 07/12/22  9:46 AM  Result Value Ref Range   Sodium 136 135 - 145 mmol/L   Potassium 4.2 3.5 - 5.1 mmol/L   Chloride 104 98 - 111 mmol/L   CO2 24 22 - 32 mmol/L   Glucose, Bld 112 (H) 70 - 99 mg/dL    Comment: Glucose reference range applies only to samples taken after fasting for at least 8 hours.   BUN 17 8 - 23 mg/dL   Creatinine, Ser 0.81 0.44 - 1.00 mg/dL   Calcium 8.7 (L) 8.9 - 10.3 mg/dL   Total Protein 6.6 6.5 - 8.1 g/dL   Albumin 3.5 3.5 - 5.0 g/dL   AST 19 15 - 41 U/L   ALT 10 0 - 44 U/L   Alkaline Phosphatase 57 38 - 126 U/L   Total Bilirubin 0.2 (L) 0.3 - 1.2 mg/dL   GFR, Estimated >60 >60 mL/min    Comment: (NOTE) Calculated using the CKD-EPI Creatinine Equation (2021)    Anion gap 8 5 - 15    Comment: Performed at [facility], [address] [place], [place] [address]  CBC with Differential/Platelet     Status: Abnormal   Collection Time: 07/12/22  9:46 AM  Result Value Ref Range   WBC 5.9 4.0 - 10.5 K/uL   RBC 2.96 (L) 3.87 - 5.11 MIL/uL   Hemoglobin 8.9 (L) 12.0 - 15.0 g/dL   HCT 28.5  (L) 36.0 - 46.0 %   MCV 96.3 80.0 - 100.0 fL   MCH 30.1 26.0 -  34.0 pg   MCHC 31.2 30.0 - 36.0 g/dL   RDW 20.3 (H) 11.5 - 15.5 %   Platelets 197 150 - 400 K/uL   nRBC 0.0 0.0 - 0.2 %   Neutrophils Relative % 54 %   Neutro Abs 3.2 1.7 - 7.7 K/uL   Lymphocytes Relative 30 %   Lymphs Abs 1.7 0.7 - 4.0 K/uL   Monocytes Relative 15 %   Monocytes Absolute 0.9 0.1 - 1.0 K/uL   Eosinophils Relative 0 %   Eosinophils Absolute 0.0 0.0 - 0.5 K/uL   Basophils Relative 1 %   Basophils Absolute 0.1 0.0 - 0.1 K/uL   Immature Granulocytes 0 %   Abs Immature Granulocytes 0.02 0.00 - 0.07 K/uL    Comment: Performed at [facility], [address] [place], [place] [address]  Urinalysis, Complete     Status: Abnormal   Collection Time: 07/27/22  9:04 AM  Result Value Ref Range   Specific Gravity, UA 1.015 1.005 - 1.030   pH, UA 5.0 5.0 - 7.5   Color, UA Yellow Yellow   Appearance Ur Clear Clear   Leukocytes,UA 1+ (A) Negative   Protein,UA Negative Negative/Trace   Glucose, UA Negative Negative   Ketones, UA Negative Negative   RBC, UA Trace (A) Negative   Bilirubin, UA Negative Negative   Urobilinogen, Ur 0.2 0.2 - 1.0 mg/dL   Nitrite, UA Negative Negative   Microscopic Examination See below:   Microscopic Examination     Status: Abnormal   Collection Time: 07/27/22  9:04 AM   Urine  Result Value Ref Range   WBC, UA 11-30 (A) 0 - 5 /hpf   RBC, Urine 0-2 0 - 2 /hpf   Epithelial Cells (non renal) 0-10 0 - 10 /hpf   Mucus, UA Present (A) Not Estab.   Bacteria, UA Few None seen/Few        Assessment/Plan: 1. Encounter for general adult medical examination with abnormal findings CPE performed, UTD on PHM  2. Generalized anxiety disorder May increase to 2tabs of xanax at bedtime as needed - ALPRAZolam (XANAX) 0.25 MG tablet; TAKE 1-2 TABLET BY MOUTH DAILY AT BEDTIME AS NEEDED FOR ANXIETY  Dispense: 60 tablet; Refill: 1  3. 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 Depression, major, single episode, mild ([place]) Continue zoloft as before  5. Flu vaccine need - Flu Vaccine MDCK QUAD PF  6. Dysuria - UA/M w/rflx Culture, Routine   General Counseling: Mithra verbalizes understanding of the findings of todays visit and agrees with plan of treatment. I have discussed any further diagnostic evaluation that may be needed or ordered today. We also reviewed her medications today. she has been encouraged to call the office with any questions or concerns that should arise related to todays visit.    Counseling:    Orders Placed This Encounter  Procedures   Flu Vaccine MDCK QUAD PF   UA/M w/rflx Culture, Routine    Meds ordered this encounter  Medications   sertraline (ZOLOFT) 100 MG tablet    Sig: TAKE (1) TABLET BY MOUTH EVERY DAY    Dispense:  90 tablet    Refill:  1   ALPRAZolam (XANAX) 0.25 MG tablet    Sig: TAKE 1-2 TABLET BY MOUTH DAILY AT BEDTIME AS NEEDED FOR ANXIETY    Dispense:  60 tablet    Refill:  1    This patient was seen by Drema Dallas, PA-C  in collaboration with Dr. Clayborn Bigness as a part of collaborative care agreement.  Total time spent:35 Minutes  Time spent includes review of chart, medications, test results, and follow up plan with the patient.     Lavera Guise, MD  Internal Medicine

## 2022-07-26 ENCOUNTER — Other Ambulatory Visit: Payer: Self-pay

## 2022-07-26 ENCOUNTER — Other Ambulatory Visit: Payer: Self-pay | Admitting: Physician Assistant

## 2022-07-26 DIAGNOSIS — J3089 Other allergic rhinitis: Secondary | ICD-10-CM

## 2022-07-27 ENCOUNTER — Ambulatory Visit: Payer: Medicare HMO | Admitting: Dietician

## 2022-07-27 ENCOUNTER — Ambulatory Visit: Payer: Medicare HMO | Admitting: Urology

## 2022-07-27 ENCOUNTER — Encounter: Payer: Self-pay | Admitting: Urology

## 2022-07-27 VITALS — BP 101/66 | HR 93 | Ht 62.0 in | Wt 137.0 lb

## 2022-07-27 DIAGNOSIS — N3289 Other specified disorders of bladder: Secondary | ICD-10-CM | POA: Diagnosis not present

## 2022-07-27 LAB — MICROSCOPIC EXAMINATION

## 2022-07-27 LAB — URINALYSIS, COMPLETE
Bilirubin, UA: NEGATIVE
Glucose, UA: NEGATIVE
Ketones, UA: NEGATIVE
Nitrite, UA: NEGATIVE
Protein,UA: NEGATIVE
Specific Gravity, UA: 1.015 (ref 1.005–1.030)
Urobilinogen, Ur: 0.2 mg/dL (ref 0.2–1.0)
pH, UA: 5 (ref 5.0–7.5)

## 2022-07-27 NOTE — Progress Notes (Signed)
   07/27/22  CC:  Chief Complaint  Patient presents with   Cysto    HPI: 66 year old female with personal history of presumed bladder cancer presents today for surveillance cystoscopy.  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  TURBT was delayed for several issues including for lobectomy and initiation of chemo for new diagnosis of lung cancer.   Ultimately, she underwent TURBT on 05/02/2022.  Intraoperatively, the lesion was much smaller than the patient in the office and had almost an atrophic-like appearance.  She also received postoperative gemcitabine.   Surgical pathology was consistent mildly thickened surface urothelial mucosa with no dysplasia was appreciated.  Presumably involution of the tumor was related to Taxotere and cisplatin which she is receiving for lung cancer.    Blood pressure 101/66, pulse 93, height 5\' 2"  (1.575 m), weight 137 lb (62.1 kg). NED. A&Ox3.   No respiratory distress   Abd soft, NT, ND Normal external genitalia with patent urethral meatus  Cystoscopy Procedure Note  Patient identification was confirmed, informed consent was obtained, and patient was prepped using Betadine solution.  Lidocaine jelly was administered per urethral meatus.    Procedure: - Flexible cystoscope introduced, without any difficulty.   - Thorough search of the bladder revealed:    normal urethral meatus    normal urothelium with patch of shaggy necrosis at biopsy side with no obvious visible tumor but visualization poor    no stones    no ulcers     no tumors    no urethral polyps    no trabeculation  - Ureteral orifices were normal in  position and appearance.  Post-Procedure: - Patient tolerated the procedure well  Assessment/ Plan:  1. Bladder mass Persistent shaggy necrosis, likely still healing  Cannot r/o underlying residual tumor, will plan for repeat cysto in 3 mo - Urinalysis, Complete     Return in about 3 months (around 10/27/2022) for cysto.  Hollice Espy, MD

## 2022-07-27 NOTE — Progress Notes (Signed)
Attempted to reach for remote nutrition follow up. Called home telephone number left message on voice mail.  April Manson, RDN, LDN Registered Dietitian, [place] Part Time Remote (Usual office hours: Tuesday-Thursday) Mobile: [phone] Remote Office: [phone]

## 2022-08-09 ENCOUNTER — Ambulatory Visit
Admission: RE | Admit: 2022-08-09 | Discharge: 2022-08-09 | Disposition: A | Discharge status: Home / Self Care | Payer: Medicare HMO | Source: Ambulatory Visit | Attending: Oncology | Admitting: Oncology

## 2022-08-09 DIAGNOSIS — R140 Abdominal distension (gaseous): Secondary | ICD-10-CM

## 2022-08-09 DIAGNOSIS — K219 Gastro-esophageal reflux disease without esophagitis: Secondary | ICD-10-CM

## 2022-08-09 DIAGNOSIS — S2241XA Multiple fractures of ribs, right side, initial encounter for closed fracture: Secondary | ICD-10-CM | POA: Diagnosis not present

## 2022-08-09 DIAGNOSIS — C3491 Malignant neoplasm of unspecified part of right bronchus or lung: Secondary | ICD-10-CM

## 2022-08-09 DIAGNOSIS — N8189 Other female genital prolapse: Secondary | ICD-10-CM | POA: Diagnosis not present

## 2022-08-09 DIAGNOSIS — R630 Anorexia: Secondary | ICD-10-CM

## 2022-08-09 DIAGNOSIS — J432 Centrilobular emphysema: Secondary | ICD-10-CM | POA: Diagnosis not present

## 2022-08-09 DIAGNOSIS — J929 Pleural plaque without asbestos: Secondary | ICD-10-CM | POA: Diagnosis not present

## 2022-08-09 DIAGNOSIS — D3501 Benign neoplasm of right adrenal gland: Secondary | ICD-10-CM | POA: Diagnosis not present

## 2022-08-09 MED ORDER — IOHEXOL 300 MG/ML  SOLN
100.0000 mL | Freq: Once | INTRAMUSCULAR | Status: AC | PRN
  Administered 2022-08-09: 100 mL via INTRAVENOUS

## 2022-08-12 ENCOUNTER — Inpatient Hospital Stay (HOSPITAL_BASED_OUTPATIENT_CLINIC_OR_DEPARTMENT_OTHER): Payer: Medicare HMO | Admitting: Oncology

## 2022-08-12 ENCOUNTER — Inpatient Hospital Stay: Payer: Medicare HMO | Attending: Oncology

## 2022-08-12 ENCOUNTER — Encounter: Payer: Self-pay | Admitting: Oncology

## 2022-08-12 VITALS — BP 127/80 | HR 96 | Temp 97.3°F | Wt 132.4 lb

## 2022-08-12 DIAGNOSIS — Z79811 Long term (current) use of aromatase inhibitors: Secondary | ICD-10-CM | POA: Diagnosis not present

## 2022-08-12 DIAGNOSIS — M8580 Other specified disorders of bone density and structure, unspecified site: Secondary | ICD-10-CM | POA: Insufficient documentation

## 2022-08-12 DIAGNOSIS — Z79899 Other long term (current) drug therapy: Secondary | ICD-10-CM | POA: Insufficient documentation

## 2022-08-12 DIAGNOSIS — C3411 Malignant neoplasm of upper lobe, right bronchus or lung: Secondary | ICD-10-CM | POA: Diagnosis not present

## 2022-08-12 DIAGNOSIS — D649 Anemia, unspecified: Secondary | ICD-10-CM | POA: Insufficient documentation

## 2022-08-12 DIAGNOSIS — D0511 Intraductal carcinoma in situ of right breast: Secondary | ICD-10-CM | POA: Diagnosis not present

## 2022-08-12 DIAGNOSIS — C3491 Malignant neoplasm of unspecified part of right bronchus or lung: Secondary | ICD-10-CM

## 2022-08-12 LAB — CBC WITH DIFFERENTIAL/PLATELET
Abs Immature Granulocytes: 0.01 10*3/uL (ref 0.00–0.07)
Basophils Absolute: 0.1 10*3/uL (ref 0.0–0.1)
Basophils Relative: 1 %
Eosinophils Absolute: 0.1 10*3/uL (ref 0.0–0.5)
Eosinophils Relative: 2 %
HCT: 30.9 % — ABNORMAL LOW (ref 36.0–46.0)
Hemoglobin: 9.7 g/dL — ABNORMAL LOW (ref 12.0–15.0)
Immature Granulocytes: 0 %
Lymphocytes Relative: 19 %
Lymphs Abs: 1.2 10*3/uL (ref 0.7–4.0)
MCH: 31.5 pg (ref 26.0–34.0)
MCHC: 31.4 g/dL (ref 30.0–36.0)
MCV: 100.3 fL — ABNORMAL HIGH (ref 80.0–100.0)
Monocytes Absolute: 0.4 10*3/uL (ref 0.1–1.0)
Monocytes Relative: 7 %
Neutro Abs: 4.5 10*3/uL (ref 1.7–7.7)
Neutrophils Relative %: 71 %
Platelets: 250 10*3/uL (ref 150–400)
RBC: 3.08 MIL/uL — ABNORMAL LOW (ref 3.87–5.11)
RDW: 17.6 % — ABNORMAL HIGH (ref 11.5–15.5)
WBC: 6.3 10*3/uL (ref 4.0–10.5)
nRBC: 0 % (ref 0.0–0.2)

## 2022-08-12 LAB — MAGNESIUM: Magnesium: 2 mg/dL (ref 1.7–2.4)

## 2022-08-12 LAB — COMPREHENSIVE METABOLIC PANEL
ALT: 13 U/L (ref 0–44)
AST: 25 U/L (ref 15–41)
Albumin: 3.5 g/dL (ref 3.5–5.0)
Alkaline Phosphatase: 67 U/L (ref 38–126)
Anion gap: 10 (ref 5–15)
BUN: 15 mg/dL (ref 8–23)
CO2: 26 mmol/L (ref 22–32)
Calcium: 8.7 mg/dL — ABNORMAL LOW (ref 8.9–10.3)
Chloride: 101 mmol/L (ref 98–111)
Creatinine, Ser: 0.76 mg/dL (ref 0.44–1.00)
GFR, Estimated: >60 mL/min (ref >60)
Glucose, Bld: 104 mg/dL — ABNORMAL HIGH (ref 70–99)
Potassium: 4.7 mmol/L (ref 3.5–5.1)
Sodium: 137 mmol/L (ref 135–145)
Total Bilirubin: 0.3 mg/dL (ref 0.3–1.2)
Total Protein: 7.2 g/dL (ref 6.5–8.1)

## 2022-08-12 NOTE — Progress Notes (Signed)
Patient feels so drained at times. Some dyspnea. Nothing tastes good but she is eating 2 -3 meals/snacks per day. Takes MOM for constipation. RA is bothering her.

## 2022-08-12 NOTE — Progress Notes (Signed)
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  INTERVAL HISTORY: Patient returns to clinic today for further evaluation and discussion of her imaging results.  She continues to have chronic weakness and fatigue, though admits this has improved.  She otherwise feels well.  She has no neurologic complaints.  She denies any recent fevers or illnesses. She has a good appetite and denies weight loss.  She denies any chest pain, shortness of breath, cough, or hemoptysis.  She denies any nausea, vomiting, constipation, or diarrhea.  Patient offers no further specific complaints today.  REVIEW OF SYSTEMS:   Review of Systems  Constitutional:  Positive for malaise/fatigue. Negative for fever and weight loss.  Respiratory: Negative.  Negative for cough, hemoptysis and shortness of breath.   Cardiovascular: Negative.  Negative for chest pain and leg swelling.  Gastrointestinal:  Positive for abdominal pain and heartburn.  Genitourinary:  Negative for dysuria and frequency.  Musculoskeletal: Negative.  Negative for back pain and myalgias.  Skin: Negative.  Negative for rash.  Neurological:  Positive for weakness. Negative for dizziness, sensory change and focal weakness.  Psychiatric/Behavioral: Negative.  The patient is not nervous/anxious.     As per HPI. Otherwise, a complete review of systems is negative.  PAST MEDICAL HISTORY: Past Medical History:  Diagnosis Date   Adenoma of right adrenal gland 12/07/2021   a.) CT chest 12/07/2021; measured 2.3 cm; not FDG avid on 01/06/2022 PET CT.    Anxiety    a.) uses BZO (alprazolam) PRN   Aortic atherosclerosis (HCC)    Arthritis    Bladder mass 12/07/2021   a.) CT abd 12/07/2021 --> 9 x 8 mm nodular enhancing focus at the base of the bladder; suspicious for urothelial neoplasm.   COPD (chronic obstructive pulmonary disease) (HCC)    Coronary artery disease 10/16/2009   a.) LHC/PCI 10/16/2009: EF 60%; 50% OM1, 99% dRCA (3.0 x 18 mm Xience V DES). b.) LHC 07/31/2014: EF 65%; 40% pLAD, 20% pLCx, 40% OM1, 30% oRCA, 50% pRCA, 20% mRCA, 10% ISR stent to dRCA; further intervention deferred opting for med. mgmt.   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 b.) s/p lumpectomy, adjuvant XRT; currently on extended AI (anastrozole) therapy   Essential hypertension    GERD (gastroesophageal reflux disease)    HLD (hyperlipidemia)    Left thyroid nodule 12/07/2021   a.) CT chest; measured 2.6 cm   Long term current use of immunosuppressive drug    a.) MTX for RA.   OSA on CPAP    Osteopenia    Personal history of radiation therapy    Pneumonia    PRES (posterior reversible encephalopathy syndrome) 07/14/2014   Pulmonary nodules 11/08/2021   a.) CXR 11/08/2021: 96m nodular density RUL. b.) CT chest 12/07/2021: 1.9 x 1.4 x 2.5 spiculated lesion anterior RUL. c.) PET CT 01/06/2022: 2.2 x 1.9 spiculated anterior RUL mass (SUV max 16.4); d.) Bx (+) NSCLC, favoring SCC with tumor necrosis; p40/CK7/GATA3 (+),  TTF-1 (-)   Rheumatoid arthritis (HCC)    a.) on MTX   Seasonal allergies    Seizures ([place]) 2014   a.) x 1 episode; etiology never determined.   Sinus headache    Squamous cell lung cancer, right ([place]) 03/17/2022   a.) RUL lobectomy 03/17/2022 --> Bx (+) for invasive moderately differentiated SCC of the RIGHT upper lobe (PDL-1: 5%) --> stage IIIA SCC (pT3, pN1, cM0)   ST elevation myocardial infarction  (STEMI) of inferior wall ([place]) 10/16/2009   a.) LHC/PCI 10/16/2009: EF 60%; 50% OM1, 99% dRCA (3.0 x 18 mm Xience V DES).   Tubular adenoma    Valvular heart disease     PAST SURGICAL HISTORY: Past Surgical History:  Procedure Laterality Date   ABDOMINAL HYSTERECTOMY     ANTERIOR CERVICAL DECOMP/DISCECTOMY FUSION N/A 01/20/2014   Procedure: ANTERIOR CERVICAL DECOMPRESSION/DISCECTOMY FUSION 2 LEVELS cervical five/six six Tarry Kos;  Surgeon: Ophelia Charter, MD;  Location: [facility];  Service: Neurosurgery;  Laterality: N/A;   APPENDECTOMY     BACK SURGERY     lumbar   BLADDER INSTILLATION N/A 05/02/2022   Procedure: BLADDER INSTILLATION OF GEMCITABINE;  Surgeon: Hollice Espy, MD;  Location: [facility];  Service: Urology;  Laterality: N/A;   BREAST BIOPSY Right 02/01/2018   x shape, DCIS    BREAST BIOPSY  01/16/2019   coil, BENIGN MAMMARY TISSUE WITH MODERATE STROMAL FIBROSIS AND COARSE DYSTROPHIC CALCIFICATIONS of the RIGHT breast   BREAST LUMPECTOMY Right 03/07/2018   Procedure: BREAST LUMPECTOMY/ RE EXCISION;  Surgeon: Herbert Pun, MD;  Location: [facility];  Service: General;  Laterality: Right;   CARDIAC CATHETERIZATION Left 07/31/2014   Procedure: CARDIAC CATHETERIZATION; Location: [facility]; Surgeon: Serafina Royals, MD   COLONOSCOPY WITH PROPOFOL N/A 02/11/2021   Procedure: COLONOSCOPY WITH PROPOFOL;  Surgeon: Lucilla Lame, MD;  Location: [facility];  Service: Endoscopy;  Laterality: N/A;  Requests Early   CORONARY ANGIOPLASTY WITH STENT PLACEMENT Left 10/16/2009   Procedure: CORONARY ANGIOPLASTY WITH STENT PLACEMENT; Location: [facility]; Surgeon: Katrine Coho, MD   CYSTOSCOPY W/ RETROGRADES Bilateral 05/02/2022   Procedure: CYSTOSCOPY WITH RETROGRADE PYELOGRAM;  Surgeon: Hollice Espy, MD;  Location: [facility];  Service: Urology;  Laterality: Bilateral;   INTERCOSTAL NERVE BLOCK Right 03/17/2022   Procedure: INTERCOSTAL NERVE BLOCK;  Surgeon: Melrose Nakayama,  MD;  Location: [facility];  Service: Thoracic;  Laterality: Right;   LASIK Bilateral    LUNG LOBECTOMY Right 03/17/2022   right upper lobe   LYMPH NODE DISSECTION Right 03/17/2022   Procedure: LYMPH NODE DISSECTION;  Surgeon: Melrose Nakayama, MD;  Location: [facility];  Service: Thoracic;  Laterality: Right;   PARTIAL MASTECTOMY WITH NEEDLE LOCALIZATION Right 02/21/2018   Procedure: PARTIAL MASTECTOMY WITH NEEDLE LOCALIZATION;  Surgeon: Herbert Pun, MD;  Location: [facility];  Service: General;  Laterality: Right;   POLYPECTOMY N/A 02/11/2021   Procedure: POLYPECTOMY;  Surgeon: Lucilla Lame, MD;  Location: [facility];  Service: Endoscopy;  Laterality: N/A;   ROTATOR CUFF REPAIR Left    SHOULDER ARTHROSCOPY WITH ROTATOR CUFF REPAIR AND OPEN BICEPS TENODESIS Right 08/07/2019   Procedure: SHOULDER ARTHROSCOPY WITH DEBRIDEMENT, DECOMPRESSION, ROTATOR CUFF REPAIR AND BICEPS TENODESIS. - RNFA;  Surgeon: Corky Mull, MD;  Location: [facility];  Service: Orthopedics;  Laterality: Right;   TOE SURGERY Right    has pin and plate in it   TRANSURETHRAL RESECTION OF BLADDER TUMOR N/A 05/02/2022   Procedure: TRANSURETHRAL RESECTION OF BLADDER TUMOR (TURBT);  Surgeon: Erlene Quan,  Caryl Pina, MD;  Location: [facility];  Service: Urology;  Laterality: N/A;   VIDEO BRONCHOSCOPY WITH ENDOBRONCHIAL ULTRASOUND N/A 03/02/2022   Procedure: VIDEO BRONCHOSCOPY WITH ENDOBRONCHIAL ULTRASOUND;  Surgeon: Tyler Pita, MD;  Location: [facility];  Service: Cardiopulmonary;  Laterality: N/A;    FAMILY HISTORY: Family History  Problem Relation Age of Onset   Breast cancer Cousin 71       bilateral at 72 and 53; daughter of maternal aunt who was unaffected   Lung cancer Maternal Aunt        dx 4s; deceased 18s; smoker   Heart attack Father        deceased 62   Stomach cancer Maternal Grandmother     ADVANCED DIRECTIVES (Y/N):  N  HEALTH MAINTENANCE: Social History   Tobacco Use   Smoking status: Former     Packs/day: 1.00    Years: 30.00    Total pack years: 30.00    Types: Cigarettes    Quit date: 09/13/2015    Years since quitting: 6.9   Smokeless tobacco: Never  Vaping Use   Vaping Use: Never used  Substance Use Topics   Alcohol use: Yes    Comment: occasional   Drug use: No     Colonoscopy:  PAP:  Bone density:  Lipid panel:  Allergies  Allergen Reactions   Other     BANDAIDS-OF LEFT ON FOR AN EXTENDED PERIOD OF TIME  Patient states latex rubber gloves are ok    Current Outpatient Medications  Medication Sig Dispense Refill   albuterol (PROVENTIL) (2.5 MG/3ML) 0.083% nebulizer solution Take 3 mLs (2.5 mg total) by nebulization every 6 (six) hours as needed for wheezing or shortness of breath. 125 mL 2   albuterol (VENTOLIN HFA) 108 (90 Base) MCG/ACT inhaler Inhale 1-2 puffs into the lungs every 6 (six) hours as needed for wheezing or shortness of breath. 1 g 1   alendronate (FOSAMAX) 70 MG tablet Take 1 tablet (70 mg total) by mouth once a week. Take with a full glass of water on an empty stomach. Stand or sit upright for 1 hour after taking (Patient taking differently: Take 70 mg by mouth once a week. Take with a full glass of water on an empty stomach. Stand or sit upright for 1 hour after taking WEDNESDAY) 12 tablet 1   ALPRAZolam (XANAX) 0.25 MG tablet TAKE 1-2 TABLET BY MOUTH DAILY AT BEDTIME AS NEEDED FOR ANXIETY 60 tablet 1   anastrozole (ARIMIDEX) 1 MG tablet Take 1 tablet (1 mg total) by mouth at bedtime. 30 tablet 10   aspirin-acetaminophen-caffeine (EXCEDRIN MIGRAINE) 045-409-81 MG tablet Take by mouth every 6 (six) hours as needed for headache.     azithromycin (ZITHROMAX Z-PAK) 250 MG tablet Take 2 tablets on day 1, and one tablet from days 2-5 6 each 0   fluconazole (DIFLUCAN) 150 MG tablet Take 1 tab po once and may repeat in 3 days if symptoms persist 3 tablet 0   fluticasone (FLONASE) 50 MCG/ACT nasal spray Place 2 sprays into both nostrils daily as needed  for allergies. 60 mL 1   fluticasone-salmeterol (ADVAIR) 250-50 MCG/ACT AEPB Inhale 1 puff into the lungs in the morning and at bedtime. 60 each 5   folic acid (FOLVITE) 1 MG tablet Take 2 mg by mouth in the morning.     gabapentin (NEURONTIN) 300 MG capsule Take 1 capsule (300 mg total) by mouth 2 (two) times daily. 60 capsule 6   HYDROcodone-acetaminophen (NORCO/VICODIN)  5-325 MG tablet Take 1-2 tablets by mouth every 6 (six) hours as needed for moderate pain. 10 tablet 0   magnesium oxide (MAG-OX) 400 (240 Mg) MG tablet Take 1 tablet (400 mg total) by mouth daily. 30 tablet 0   methotrexate (RHEUMATREX) 2.5 MG tablet Take 20 mg by mouth once a week. WEDNESDAY     OLANZapine (ZYPREXA) 10 MG tablet Take 1 tablet (10 mg total) by mouth at bedtime. For nausea 30 tablet 0   oxybutynin (DITROPAN) 5 MG tablet Take 1 tablet (5 mg total) by mouth every 8 (eight) hours as needed for bladder spasms. 30 tablet 0   pantoprazole (PROTONIX) 40 MG tablet Take 1 tablet (40 mg total) by mouth daily. 30 tablet 3   potassium chloride (KLOR-CON) 10 MEQ tablet TAKE (1) TABLET BY MOUTH EVERY DAY 90 tablet 1   rosuvastatin (CRESTOR) 40 MG tablet Take 40 mg by mouth every evening.     sertraline (ZOLOFT) 100 MG tablet TAKE (1) TABLET BY MOUTH EVERY DAY 90 tablet 1   simethicone (MYLICON) 732 MG chewable tablet Chew 125 mg by mouth every 6 (six) hours as needed for flatulence.     traMADol (ULTRAM) 50 MG tablet TAKE (1) TABLET BY MOUTH THREE TIMES A DAY AS NEEDED     traMADol (ULTRAM) 50 MG tablet Take 1-2 tablets (50-100 mg total) by mouth 3 (three) times daily as needed. 60 tablet 0   montelukast (SINGULAIR) 10 MG tablet TAKE (1) TABLET BY MOUTH DAILY AT BEDTIME 90 tablet 1   ondansetron (ZOFRAN) 8 MG tablet Take 1 tablet (8 mg total) by mouth every 8 (eight) hours as needed for nausea or vomiting. (Patient not taking: Reported on 08/12/2022) 60 tablet 0   No current facility-administered medications for this visit.     OBJECTIVE: Vitals:   08/12/22 1044  BP: 127/80  Pulse: 96  Temp: (!) 97.3 F (36.3 C)  SpO2: 99%     Body mass index is 24.22 kg/m.    ECOG FS:0 - Asymptomatic  General: Well-developed, well-nourished, no acute distress. Eyes: Pink conjunctiva, anicteric sclera. HEENT: Normocephalic, moist mucous membranes. Lungs: No audible wheezing or coughing. Heart: Regular rate and rhythm. Abdomen: Soft, nontender, no obvious distention. Musculoskeletal: No edema, cyanosis, or clubbing. Neuro: Alert, answering all questions appropriately. Cranial nerves grossly intact. Skin: No rashes or petechiae noted. Psych: Normal affect.  LAB RESULTS:  Lab Results  Component Value Date   NA 137 08/12/2022   K 4.7 08/12/2022   CL 101 08/12/2022   CO2 26 08/12/2022   GLUCOSE 104 (H) 08/12/2022   BUN 15 08/12/2022   CREATININE 0.76 08/12/2022   CALCIUM 8.7 (L) 08/12/2022   PROT 7.2 08/12/2022   ALBUMIN 3.5 08/12/2022   AST 25 08/12/2022   ALT 13 08/12/2022   ALKPHOS 67 08/12/2022   BILITOT 0.3 08/12/2022   GFRNONAA >60 08/12/2022   GFRAA >60 04/09/2020    Lab Results  Component Value Date   WBC 6.3 08/12/2022   NEUTROABS 4.5 08/12/2022   HGB 9.7 (L) 08/12/2022   HCT 30.9 (L) 08/12/2022   MCV 100.3 (H) 08/12/2022   PLT 250 08/12/2022     STUDIES: CT CHEST ABDOMEN PELVIS W CONTRAST  Result Date: 08/09/2022 CLINICAL DATA:  Squamous cell carcinoma of right lung. Status post chemotherapy. Right upper lobectomy 03/17/2022. Status post chemotherapy. Abdominal bloating and feeling of something in her throat since last chemotherapy in August. TURBT 05/02/2022. * Tracking Code: BO * EXAM: CT  CHEST, ABDOMEN, AND PELVIS WITH CONTRAST TECHNIQUE: Multidetector CT imaging of the chest, abdomen and pelvis was performed following the standard protocol during bolus administration of intravenous contrast. RADIATION DOSE REDUCTION: This exam was performed according to the departmental  dose-optimization program which includes automated exposure control, adjustment of the mA and/or kV according to patient size and/or use of iterative reconstruction technique. CONTRAST:  144m OMNIPAQUE IOHEXOL 300 MG/ML  SOLN COMPARISON:  07/07/2022 CTA chest. 01/06/2022 PET. 12/07/2021 abdominopelvic CT. FINDINGS: CT CHEST FINDINGS Cardiovascular: Aortic atherosclerosis. Normal heart size, without pericardial effusion. Three vessel coronary artery calcification. No central pulmonary embolism, on this non-dedicated study. Mediastinum/Nodes: 2.6 cm left thyroid nodule on 04/02 is similar to on the prior, present but enlarged compared to 12/23/2008. This was biopsied on 01/19/2009. No mediastinal or hilar adenopathy. This has been evaluated on previous imaging. (ref: J Am Coll Radiol. 2015 Feb;12(2): 143-50). Lungs/pleura: Decrease in loculated anterior superior right-sided pleural fluid including on 13/2, small volume. Trace inferior right pleural fluid is decreased compared to 07/07/2022. Status post right upper lobectomy. Trace pleural air is suspected medial and cephalad to surgical sutures including on 48/4, new. Mild centrilobular emphysema. Persistent volume loss within the anteromedial right middle lobe. Septal thickening at the right lung base is smooth, similar, and suggests mild pulmonary venous congestion clear left lung. Musculoskeletal: Lateral right breast fluid collection of maximally 6.2 cm is similar to 07/07/2022 (when remeasured). Cervical spine fixation. Multiple nonacute right-sided rib fractures are similar to on the prior. S shaped thoracolumbar spine curvature CT ABDOMEN PELVIS FINDINGS Hepatobiliary: Mild caudate and lateral segment left liver lobe enlargement. Minimal hypoattenuation adjacent to the falciform is new but favored to represent focal steatosis including on 59/2. Normal gallbladder, without biliary ductal dilatation. Pancreas: Normal, without mass or ductal dilatation. Spleen:  Normal in size, without focal abnormality. Adrenals/Urinary Tract: A 2.2 cm right adrenal lesion most consistent with an adenoma on prior PET. Normal left adrenal gland and right kidney. Mild scarring involves the inter/upper pole left kidney. No hydronephrosis. Air within the urinary bladder. Deformity at the right bladder base including on 107/2 is likely the site of tumor resection. Stomach/Bowel: Normal stomach, without wall thickening. Normal colon and terminal ileum. Normal small bowel. Vascular/Lymphatic: Aortic atherosclerosis. No abdominopelvic adenopathy. Reproductive: Hysterectomy.  No adnexal mass. Other: No significant free fluid. Mild pelvic floor laxity. No free intraperitoneal air. No evidence of omental or peritoneal disease. Musculoskeletal: L3-5 trans pedicle screw fixation. IMPRESSION: CT CHEST IMPRESSION 1. Status post right upper lobectomy. Decrease in loculated right-sided pleural fluid since 07/07/2022 CT. Trace right-sided pleural air immediately adjacent to surgical sutures is of questionable clinical significance. 2. No typical findings of metastatic disease. 3. Similar septal thickening in the right lower lobe is smooth, favoring mild interstitial edema. Recommend attention on follow-up to exclude less likely lymphangitic tumor spread given asymmetry. 4. Similar right breast fluid collection, likely hematoma/seroma. 5. Similar nonacute right rib fractures, likely posttraumatic given distribution. 6. Aortic atherosclerosis (ICD10-I70.0), coronary artery atherosclerosis and emphysema (ICD10-J43.9). CT ABDOMEN AND PELVIS IMPRESSION 1. No typical findings of metastatic disease in the abdomen or pelvis. 2. Air within the urinary bladder with irregularity involving the posterior right paramidline wall, possibly related to the clinical history of TURBT. Correlate with more recent instrumentations and symptoms which could suggest cystitis or fistulous communication to bowel. 3. Hypoattenuation  adjacent the falciform ligament is favored to represent focal steatosis. Recommend attention on follow-up. 4. Right adrenal adenoma 5. Caudate and lateral segment left liver  lobe enlargement are nonspecific but can be seen with early/mild cirrhosis. Electronically Signed   By: Abigail Miyamoto M.D.   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  PLAN:    1.  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  She was also noted to have 1 of 33 lymph nodes positive for disease.  This increased her to a stage IIIa. MRI of the brain on April 21, 2022 reviewed independently with no obvious evidence of metastatic disease. Patient completed 4 cycles of adjuvant chemotherapy using cisplatin and Taxotere on June 22, 2022.  Repeat imaging on August 09, 2022 reviewed independently and report as above with no obvious evidence of recurrent or progressive disease.  No intervention is needed at this time.  Return to clinic in 3 months with repeat imaging and further evaluation.  If no evidence of disease, patient can be transition to imaging and evaluation every 6 months.     2. DCIS, right breast: Patient underwent lumpectomy followed by adjuvant XRT completing in September 2019.  Because there was no invasive component on her pathology, she did not require adjuvant chemotherapy. Patient could not tolerate tamoxifen or letrozole secondary to worsening joint pain, therefore was switched to anastrozole.  She is tolerating this well and will complete 5 years of treatment in September 2024.  Her most recent mammogram on March 11, 2022 was reported BI-RADS 1.  Repeat in June 2024. 3.  Osteopenia: Patient's most recent bone mineral density on March 25, 2021 revealed a T score of  -2.3 which is slightly worse than previous where her T score was reported -1.9 and -1.4 and years prior.  Continue calcium, vitamin D, and alendronate.    4.  Reflux/abdominal pain: CT of the abdomen pelvis as above with no obvious pathology.  Patient has been instructed to keep her appointment with GI in January.  Continue omeprazole.   5.  Anemia: Patient's hemoglobin has improved to 9.7.  Monitor.     Lloyd Huger, MD 08/12/2022 3:26 PM   Cancer Staging  Ductal carcinoma in situ (DCIS) of right breast Staging form: Breast, AJCC 8th Edition - Clinical: Stage 0 (cTis (DCIS), cN0, cM0, ER+, PR-, HER2-) - Signed by Lloyd Huger, MD on 03/18/2018 Nuclear grade: G3 Laterality: Right  Squamous cell carcinoma of right lung ([place]) Staging form: Lung, AJCC 8th Edition - Pathologic stage from 04/07/2022: Stage IIIA (pT3, pN1, cM0) - Signed by Lloyd Huger, MD on 04/07/2022

## 2022-08-15 DIAGNOSIS — M47816 Spondylosis without myelopathy or radiculopathy, lumbar region: Secondary | ICD-10-CM | POA: Diagnosis not present

## 2022-08-15 DIAGNOSIS — M65341 Trigger finger, right ring finger: Secondary | ICD-10-CM | POA: Diagnosis not present

## 2022-08-15 DIAGNOSIS — M0579 Rheumatoid arthritis with rheumatoid factor of multiple sites without organ or systems involvement: Secondary | ICD-10-CM | POA: Diagnosis not present

## 2022-08-15 DIAGNOSIS — Z79899 Other long term (current) drug therapy: Secondary | ICD-10-CM | POA: Diagnosis not present

## 2022-09-01 ENCOUNTER — Encounter: Payer: Self-pay | Admitting: Pulmonary Disease

## 2022-09-01 ENCOUNTER — Ambulatory Visit (INDEPENDENT_AMBULATORY_CARE_PROVIDER_SITE_OTHER): Payer: Medicare HMO | Admitting: Pulmonary Disease

## 2022-09-01 VITALS — BP 138/80 | HR 95 | Ht 62.0 in | Wt 128.2 lb

## 2022-09-01 DIAGNOSIS — J449 Chronic obstructive pulmonary disease, unspecified: Secondary | ICD-10-CM | POA: Diagnosis not present

## 2022-09-01 DIAGNOSIS — G4733 Obstructive sleep apnea (adult) (pediatric): Secondary | ICD-10-CM

## 2022-09-01 DIAGNOSIS — C3491 Malignant neoplasm of unspecified part of right bronchus or lung: Secondary | ICD-10-CM

## 2022-09-01 MED ORDER — ALBUTEROL SULFATE HFA 108 (90 BASE) MCG/ACT IN AERS: 1.0000 | INHALATION_SPRAY | Freq: Four times a day (QID) | RESPIRATORY_TRACT | 3 refills | Status: DC | PRN

## 2022-09-01 NOTE — Patient Instructions (Signed)
We are ordering a home sleep test.  Be advised that these are about 4 to 6 weeks.  I have sent the prescription for your albuterol to your pharmacy.  We will see you in follow-up in 2 to 3 months time call sooner should any new problems arise.

## 2022-09-01 NOTE — Progress Notes (Signed)
Subjective:    Patient ID: Michelle Moore, female    DOB: December 09, 1955, 66 y.o.   MRN: [id number] Patient Care Team: Alan Ripper as PCP - General (Physician Assistant) Glory Buff, RN as Oncology Nurse Navigator Orlie Dakin, Tollie Pizza, MD as Consulting Physician (Oncology)  Chief Complaint  Patient presents with   Follow-up    Breathing is better. SOB with exertion and when her reflux is acting up. No wheezing. No cough.    HPI The patient is a 66 year old former smoker with a 30-pack-year history of smoking who presents for follow-up COPD and shortness of breath.  Recall that she had endobronchial ultrasound performed 21 February 2022.  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 Patient was presented at [facility] given the indeterminate nature of her adenopathy on PET/CT.  Biopsy was performed as this would impact management of her cancer.  She has a right upper lobe lung nodule that has been noted to be compatible with squamous cell carcinoma. The patient underwent endobronchial ultrasound with TBNA as noted on 12 June, the lymph node of concern on PET/CT was sampled showed adequate lymphoid population but no malignancy.  The remainder of the examination with EBUS did not show any specific targets to biopsy.  The patient is to underwent lobectomy by Dr. Dorris Fetch on 17 March 2022.  She had 1 out of 18 nodes positive for carcinoma.  She was increased to a stage IIIa and underwent 4 cycles of adjuvant cisplatin and Taxotere completed in October.  She is being followed very closely by oncology.  She states overall she is feeling better.  She has some dyspnea on exertion but no wheezing.  Her only issues with dyspnea at rest occur when she has issues with her gastroesophageal reflux acting up.  She follows with GI for this issue.  Recently placed on Protonix for reflux.  She  has had a CPAP for years and would like replacement for this device.  However she will need a sleep study as the original sleep study cannot be found.  She is willing to undergo this.   DATA 12/07/2021 CT chest: 1.9 x 1.4 x 2.5 cm spiculated lesion right upper lobe, 9 mm paratracheal lymph node, on the right breast. 01/06/2022 PET/CT: 2.2 x 1.9 spiculated nodule anterior right upper lobe SUV 16.4 consistent with malignancy, low right paratracheal node 12 mm short axis SUV 4.5 (indeterminate).  Destructive changes involving right lateral seventh rib worrisome for metastatic disease. 01/12/2022 PFTs ([facility]): FEV1 0.9 L or 44%, FVC 1.23 L or 47%, FEV1/FVC 73%.  Minimal bronchodilator response.  Lung volumes mildly reduced likely due to air trapping.  Diffusion capacity moderately reduced at 57%.  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 02/08/2022 right upper lobe biopsy: Percutaneous needle biopsy consistent with non-small cell carcinoma favor squamous cell carcinoma. 03/02/2022 EBUS TBNA 4R lymph node: Negative for malignancy. 03/17/2022 surgical path: Right upper lobe squamous cell carcinoma, 1 out of 18 lymph nodes positive for carcinoma.  Review of Systems A 10 point review of systems was performed and it is as noted above otherwise negative.  Patient Active Problem List   Diagnosis Date Noted   Dysphagia 10/07/2022   Abdominal pain, epigastric 10/07/2022   S/P lobectomy of lung 03/17/2022   S/P robot-assisted surgical procedure 03/17/2022   Squamous cell carcinoma of right lung (HCC) 02/15/2022  S/P cardiac catheterization 04/06/2021   Osteoporosis without current pathological fracture 04/06/2021   Aromatase inhibitor use 04/06/2021   Special screening for malignant neoplasms, colon    Family history of colon cancer    Polyp of descending colon    Cough 08/28/2020   Hypokalemia 07/25/2020   Urinary tract infection without hematuria 07/25/2020   Recurrent  displacement of lumbar disc 04/08/2020   Essential hypertension 01/22/2020   Degenerative tear of glenoid labrum of right shoulder 07/19/2019   Nontraumatic complete tear of right rotator cuff 07/19/2019   Rotator cuff tendinitis, right 07/19/2019   Tendinitis of upper biceps tendon of right shoulder 07/19/2019   Encounter for general adult medical examination with abnormal findings 05/29/2019   Dysuria 05/29/2019   Acute non-recurrent pansinusitis 06/01/2018   Perennial allergic rhinitis 06/01/2018   Generalized anxiety disorder 06/01/2018   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   Coronary artery disease 11/09/2017   Mixed hyperlipidemia 11/09/2017   Bilateral carotid artery stenosis 08/14/2014   Gastroesophageal reflux disease with esophagitis 08/14/2014   OSA on CPAP 07/14/2014   Weight loss of more than 10% body weight 07/14/2014   Cervical spondylosis with radiculopathy 01/20/2014   Cervical spondylosis without myelopathy 01/20/2014   Social History   Tobacco Use   Smoking status: Former    Packs/day: 1.00    Years: 30.00    Additional pack years: 0.00    Total pack years: 30.00    Types: Cigarettes    Quit date: 09/13/2015    Years since quitting: 7.5   Smokeless tobacco: Never  Substance Use Topics   Alcohol use: Yes    Comment: occasional   Allergies  Allergen Reactions   Other     BANDAIDS-OF LEFT ON FOR AN EXTENDED PERIOD OF TIME  Patient states latex rubber gloves are ok   Current Meds  Medication Sig   albuterol (PROVENTIL) (2.5 MG/3ML) 0.083% nebulizer solution Take 3 mLs (2.5 mg total) by nebulization every 6 (six) hours as needed for wheezing or shortness of breath.   anastrozole (ARIMIDEX) 1 MG tablet Take 1 tablet (1 mg total) by mouth at bedtime.   aspirin-acetaminophen-caffeine (EXCEDRIN MIGRAINE) 250-250-65 MG tablet Take by mouth every 6 (six) hours as needed for headache.   fluticasone (FLONASE) 50 MCG/ACT nasal spray Place 2  sprays into both nostrils daily as needed for allergies.   folic acid (FOLVITE) 1 MG tablet Take 2 mg by mouth in the morning.   gabapentin (NEURONTIN) 300 MG capsule Take 1 capsule (300 mg total) by mouth 2 (two) times daily.   methotrexate (RHEUMATREX) 2.5 MG tablet Take 20 mg by mouth once a week. WEDNESDAY   rosuvastatin (CRESTOR) 40 MG tablet Take 40 mg by mouth every evening.   traMADol (ULTRAM) 50 MG tablet Take 1-2 tablets (50-100 mg total) by mouth 3 (three) times daily as needed.   albuterol (VENTOLIN HFA) 108 (90 Base) MCG/ACT inhaler Inhale 1-2 puffs into the lungs every 6 (six) hours as needed for wheezing or shortness of breath.   alendronate (FOSAMAX) 70 MG tablet Take 1 tablet (70 mg total) by mouth once a week. Take with a full glass of water on an empty stomach. Stand or sit upright for 1 hour after taking (Patient taking differently: Take 70 mg by mouth once a week. Take with a full glass of water on an empty stomach. Stand or sit upright for 1 hour after taking WEDNESDAY)   ALPRAZolam (XANAX) 0.25 MG tablet  TAKE 1-2 TABLET BY MOUTH DAILY AT BEDTIME AS NEEDED FOR ANXIETY   fluticasone-salmeterol (ADVAIR) 250-50 MCG/ACT AEPB Inhale 1 puff into the lungs in the morning and at bedtime.   montelukast (SINGULAIR) 10 MG tablet TAKE (1) TABLET BY MOUTH DAILY AT BEDTIME   [DISCONTINUED] pantoprazole (PROTONIX) 40 MG tablet Take 1 tablet (40 mg total) by mouth daily.   potassium chloride (KLOR-CON) 10 MEQ tablet TAKE (1) TABLET BY MOUTH EVERY DAY   sertraline (ZOLOFT) 100 MG tablet TAKE (1) TABLET BY MOUTH EVERY DAY   Immunization History  Administered Date(s) Administered   Influenza Inj Mdck Quad Pf 04/24/2020, 06/03/2021, 07/25/2022   Influenza,inj,Quad PF,6+ Mos 08/02/2018   Influenza,inj,quad, With Preservative 06/17/2019   Influenza-Unspecified 06/17/2019   Moderna SARS-COV2 Booster Vaccination 08/04/2020   Moderna Sars-Covid-2 Vaccination 11/13/2019, 12/11/2019   Pneumococcal  Conjugate-13 06/17/2019   Pneumococcal Polysaccharide-23 06/17/2019       Objective:   Physical Exam BP 138/80 (BP Location: Left Arm, Cuff Size: Normal)   Pulse 95   Ht 5\' 2"  (1.575 m)   Wt 128 lb 3.2 oz (58.2 kg)   SpO2 98%   BMI 23.45 kg/m   SpO2: 98 % O2 Device: None (Room air)  GENERAL: Well-developed, well-nourished woman, no acute distress. Fully ambulatory.  No conversational dyspnea. HEAD: Normocephalic, atraumatic.  EYES: Pupils equal, round, reactive to light.  No scleral icterus.  MOUTH: Oral mucosa moist.  No thrush. NECK: Supple. No thyromegaly. Trachea midline. No JVD.  No adenopathy. PULMONARY: Good air entry bilaterally.  No adventitious sounds. CARDIOVASCULAR: S1 and S2. Regular rate and rhythm.  No rubs, murmurs or gallops heard. ABDOMEN: Benign. MUSCULOSKELETAL: No joint deformity, no clubbing, no edema.  NEUROLOGIC: No overt focal deficit, no gait disturbance, speech is fluent. SKIN: Intact,warm,dry. PSYCH: Normal mood, normal behavior.      09/01/2022    1:00 PM  Results of the Epworth flowsheet  Sitting and reading 1  Watching TV 1  Sitting, inactive in a public place (e.g. a theatre or a meeting) 0  As a passenger in a car for an hour without a break 0  Lying down to rest in the afternoon when circumstances permit 3  Sitting and talking to someone 0  Sitting quietly after a lunch without alcohol 0  In a car, while stopped for a few minutes in traffic 0  Total score 5       Assessment & Plan:     ICD-10-CM   1. Stage 3 severe COPD by GOLD classification (HCC)  J44.9    Well compensated on her current regimen Advair 250/50, 1 inhalation twice a day Continue albuterol as needed, refilled    2. OSA (obstructive sleep apnea)  G47.33 Home sleep test   Needs reassessment for new CPAP equipment Home sleep study    3. Squamous cell carcinoma of right lung (HCC)  C34.91    This issue has complexity to her management Follows with oncology      Meds ordered this encounter  Medications   albuterol (VENTOLIN HFA) 108 (90 Base) MCG/ACT inhaler    Sig: Inhale 1-2 puffs into the lungs every 6 (six) hours as needed for wheezing or shortness of breath.    Dispense:  1 g    Refill:  3   Orders Placed This Encounter  Procedures   Home sleep test    Standing Status:   Future    Number of Occurrences:   1    Standing Expiration Date:  09/02/2023    Order Specific Question:   Where should this test be performed:    Answer:   [facility]   Will see the patient in follow-up in 2 to 3 months time she is to contact us prior to that time should any new difficulties arise.  Gailen Shelter, MD Advanced Bronchoscopy P[facility]    *This note was dictated using voice recognition software/Dragon.  Despite best efforts to proofread, errors can occur which can change the meaning. Any transcriptional errors that result from this process are unintentional and may not be fully corrected at the time of dictation.

## 2022-09-08 ENCOUNTER — Telehealth: Payer: Self-pay

## 2022-09-08 NOTE — Patient Outreach (Signed)
  Care Coordination   09/08/2022 Name: Michelle Moore MRN: [id number] DOB: [birth year]   Care Coordination Outreach Attempts:  Successful outreach made to patient.  Patient states she is not interested.   Follow Up Plan:  No further outreach attempts will be made at this time. We have been unable to contact the patient to offer or enroll patient in care coordination services  Encounter Outcome:  Pt. Refused   Care Coordination Interventions:  No, not indicated    Quinn Plowman [facility] [place] [phone] direct line

## 2022-09-14 DIAGNOSIS — M5416 Radiculopathy, lumbar region: Secondary | ICD-10-CM | POA: Diagnosis not present

## 2022-09-14 DIAGNOSIS — M48062 Spinal stenosis, lumbar region with neurogenic claudication: Secondary | ICD-10-CM | POA: Diagnosis not present

## 2022-09-14 DIAGNOSIS — M5136 Other intervertebral disc degeneration, lumbar region: Secondary | ICD-10-CM | POA: Diagnosis not present

## 2022-09-15 ENCOUNTER — Encounter: Payer: Self-pay | Admitting: Gastroenterology

## 2022-09-22 ENCOUNTER — Telehealth: Payer: Medicare HMO | Admitting: Gastroenterology

## 2022-10-03 ENCOUNTER — Ambulatory Visit: Payer: Medicare HMO | Admitting: Gastroenterology

## 2022-10-03 ENCOUNTER — Encounter: Payer: Self-pay | Admitting: Gastroenterology

## 2022-10-03 VITALS — BP 118/79 | HR 88 | Temp 97.9°F | Ht 62.0 in | Wt 125.0 lb

## 2022-10-03 DIAGNOSIS — R1013 Epigastric pain: Secondary | ICD-10-CM | POA: Diagnosis not present

## 2022-10-03 DIAGNOSIS — R1310 Dysphagia, unspecified: Secondary | ICD-10-CM | POA: Diagnosis not present

## 2022-10-03 NOTE — H&P (View-Only) (Signed)
Primary Care Physician: Carlean Jews, PA-C  Primary Gastroenterologist:  Dr. Midge Minium  Chief Complaint  Patient presents with   Bloated   Loss of Appetite   Gastroesophageal Reflux    HPI: Michelle Moore is a 67 y.o. female here with a history of seeing me with a colonoscopy in 2022 for chronic idiopathic constipation.  The patient had multiple polyps that time and was recommended to have a repeat colonoscopy in 3 years.  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  The patient was referred to see me due to abdominal pain and reflux and the patient was told to continue omeprazole and follow-up with me.  The patient had a CT scan in November that showed:  CT ABDOMEN AND PELVIS IMPRESSION  1. No typical findings of metastatic disease in the abdomen or pelvis. 2. Air within the urinary bladder with irregularity involving the posterior right paramidline wall, possibly related to the clinical history of TURBT. Correlate with more recent instrumentations and symptoms which could suggest cystitis or fistulous communication to bowel. 3. Hypoattenuation adjacent the falciform ligament is favored to represent focal steatosis. Recommend attention on follow-up. 4. Right adrenal adenoma 5. Caudate and lateral segment left liver lobe enlargement are nonspecific but can be seen with early/mild cirrhosis.  Despite the patient's findings of nonspecific left lobe of the liver enlargement the patient has had no sign of thrombocytopenia, hypoalbuminemia or abnormal liver enzymes to suggest cirrhosis. She reports that she has cramps after she eats and sometimes food gets stuck in the esophagus. The omprole makes it better and she sometimes has to take 2. Lat year she was 149 Lbs and ow she is 125 lbs  Past Medical History:  Diagnosis Date   Adenoma of right adrenal gland 12/07/2021   a.) CT chest 12/07/2021; measured 2.3 cm; not FDG avid on  01/06/2022 PET CT.   Anxiety    a.) uses BZO (alprazolam) PRN   Aortic atherosclerosis (HCC)    Arthritis    Bladder mass 12/07/2021   a.) CT abd 12/07/2021 --> 9 x 8 mm nodular enhancing focus at the base of the bladder; suspicious for urothelial neoplasm.   COPD (chronic obstructive pulmonary disease) (HCC)    Coronary artery disease 10/16/2009   a.) LHC/PCI 10/16/2009: EF 60%; 50% OM1, 99% dRCA (3.0 x 18 mm Xience V DES). b.) LHC 07/31/2014: EF 65%; 40% pLAD, 20% pLCx, 40% OM1, 30% oRCA, 50% pRCA, 20% mRCA, 10% ISR stent to dRCA; further intervention deferred opting for med. mgmt.   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 b.) s/p lumpectomy, adjuvant XRT; currently on extended AI (anastrozole) therapy   Essential hypertension    GERD (gastroesophageal reflux disease)    HLD (hyperlipidemia)    Left thyroid nodule 12/07/2021   a.) CT chest; measured 2.6 cm   Long term current use of immunosuppressive drug    a.) MTX for RA.   OSA on CPAP    Osteopenia    Personal history of radiation therapy    Pneumonia    PRES (posterior reversible encephalopathy syndrome) 07/14/2014   Pulmonary nodules 11/08/2021   a.) CXR 11/08/2021: 68mm nodular density RUL. b.) CT chest 12/07/2021: 1.9 x 1.4 x 2.5 spiculated lesion anterior RUL. c.) PET CT 01/06/2022: 2.2 x 1.9  spiculated anterior RUL mass (SUV max 16.4); d.) Bx (+) NSCLC, favoring SCC with tumor necrosis; p40/CK7/GATA3 (+), TTF-1 (-)   Rheumatoid arthritis (HCC)    a.) on MTX   Seasonal allergies    Seizures (HCC) 2014   a.) x 1 episode; etiology never determined.   Sinus headache    Squamous cell lung cancer, right (HCC) 03/17/2022   a.) RUL lobectomy 03/17/2022 --> Bx (+) for invasive moderately differentiated SCC of the RIGHT upper lobe (PDL-1: 5%) --> stage IIIA SCC (pT3, pN1, cM0)   ST elevation  myocardial infarction (STEMI) of inferior wall (HCC) 10/16/2009   a.) LHC/PCI 10/16/2009: EF 60%; 50% OM1, 99% dRCA (3.0 x 18 mm Xience V DES).   Tubular adenoma    Valvular heart disease     Current Outpatient Medications  Medication Sig Dispense Refill   albuterol (PROVENTIL) (2.5 MG/3ML) 0.083% nebulizer solution Take 3 mLs (2.5 mg total) by nebulization every 6 (six) hours as needed for wheezing or shortness of breath. 125 mL 2   albuterol (VENTOLIN HFA) 108 (90 Base) MCG/ACT inhaler Inhale 1-2 puffs into the lungs every 6 (six) hours as needed for wheezing or shortness of breath. 1 g 3   alendronate (FOSAMAX) 70 MG tablet Take 1 tablet (70 mg total) by mouth once a week. Take with a full glass of water on an empty stomach. Stand or sit upright for 1 hour after taking (Patient taking differently: Take 70 mg by mouth once a week. Take with a full glass of water on an empty stomach. Stand or sit upright for 1 hour after taking WEDNESDAY) 12 tablet 1   ALPRAZolam (XANAX) 0.25 MG tablet TAKE 1-2 TABLET BY MOUTH DAILY AT BEDTIME AS NEEDED FOR ANXIETY 60 tablet 1   anastrozole (ARIMIDEX) 1 MG tablet Take 1 tablet (1 mg total) by mouth at bedtime. 30 tablet 10   aspirin-acetaminophen-caffeine (EXCEDRIN MIGRAINE) 250-250-65 MG tablet Take by mouth every 6 (six) hours as needed for headache.     azithromycin (ZITHROMAX Z-PAK) 250 MG tablet Take 2 tablets on day 1, and one tablet from days 2-5 6 each 0   fluconazole (DIFLUCAN) 150 MG tablet Take 1 tab po once and may repeat in 3 days if symptoms persist 3 tablet 0   fluticasone (FLONASE) 50 MCG/ACT nasal spray Place 2 sprays into both nostrils daily as needed for allergies. 60 mL 1   fluticasone-salmeterol (ADVAIR) 250-50 MCG/ACT AEPB Inhale 1 puff into the lungs in the morning and at bedtime. 60 each 5   folic acid (FOLVITE) 1 MG tablet Take 2 mg by mouth in the morning.     gabapentin (NEURONTIN) 300 MG capsule Take 1 capsule (300 mg total) by mouth  2 (two) times daily. 60 capsule 6   HYDROcodone-acetaminophen (NORCO/VICODIN) 5-325 MG tablet Take 1-2 tablets by mouth every 6 (six) hours as needed for moderate pain. 10 tablet 0   magnesium oxide (MAG-OX) 400 (240 Mg) MG tablet Take 1 tablet (400 mg total) by mouth daily. 30 tablet 0   methotrexate (RHEUMATREX) 2.5 MG tablet Take 20 mg by mouth once a week. WEDNESDAY     montelukast (SINGULAIR) 10 MG tablet TAKE (1) TABLET BY MOUTH DAILY AT BEDTIME 90 tablet 1   OLANZapine (ZYPREXA) 10 MG tablet Take 1 tablet (10 mg total) by mouth at bedtime. For nausea 30 tablet 0   ondansetron (ZOFRAN) 8 MG tablet Take 1 tablet (8 mg total) by mouth every 8 (eight) hours as  needed for nausea or vomiting. 60 tablet 0   oxybutynin (DITROPAN) 5 MG tablet Take 1 tablet (5 mg total) by mouth every 8 (eight) hours as needed for bladder spasms. 30 tablet 0   pantoprazole (PROTONIX) 40 MG tablet Take 1 tablet (40 mg total) by mouth daily. 30 tablet 3   potassium chloride (KLOR-CON) 10 MEQ tablet TAKE (1) TABLET BY MOUTH EVERY DAY 90 tablet 1   rosuvastatin (CRESTOR) 40 MG tablet Take 40 mg by mouth every evening.     sertraline (ZOLOFT) 100 MG tablet TAKE (1) TABLET BY MOUTH EVERY DAY 90 tablet 1   simethicone (MYLICON) 125 MG chewable tablet Chew 125 mg by mouth every 6 (six) hours as needed for flatulence.     traMADol (ULTRAM) 50 MG tablet      traMADol (ULTRAM) 50 MG tablet Take 1-2 tablets (50-100 mg total) by mouth 3 (three) times daily as needed. 60 tablet 0   No current facility-administered medications for this visit.    Allergies as of 10/03/2022 - Review Complete 10/03/2022  Allergen Reaction Noted   Other  02/15/2018    ROS:  General: Negative for anorexia, weight loss, fever, chills, fatigue, weakness. ENT: Negative for hoarseness, difficulty swallowing , nasal congestion. CV: Negative for chest pain, angina, palpitations, dyspnea on exertion, peripheral edema.  Respiratory: Negative for  dyspnea at rest, dyspnea on exertion, cough, sputum, wheezing.  GI: See history of present illness. GU:  Negative for dysuria, hematuria, urinary incontinence, urinary frequency, nocturnal urination.  Endo: Negative for unusual weight change.    Physical Examination:   BP 118/79 (BP Location: Left Arm, Patient Position: Sitting, Cuff Size: Normal)   Pulse 88   Temp 97.9 F (36.6 C) (Oral)   Ht 5\' 2"  (1.575 m)   Wt 125 lb (56.7 kg)   BMI 22.86 kg/m   General: Well-nourished, well-developed in no acute distress.  Eyes: No icterus. Conjunctivae pink. Lungs: Clear to auscultation bilaterally. Non-labored. Heart: Regular rate and rhythm, no murmurs rubs or gallops.  Abdomen: Bowel sounds are normal, nontender, nondistended, no hepatosplenomegaly or masses, no abdominal bruits or hernia , no rebound or guarding.   Extremities: No lower extremity edema. No clubbing or deformities. Neuro: Alert and oriented x 3.  Grossly intact. Skin: Warm and dry, no jaundice.   Psych: Alert and cooperative, normal mood and affect.  Labs:    Imaging Studies: No results found.  Assessment and Plan:   Michelle Moore is a 67 y.o. y/o female who comes in today with a report of intermittent dysphagia, postprandial abdominal pain and weight loss.  The patient had a colonoscopy with a few polyps back in 2022 with a recommended repeat colonoscopy in 3 years.  She is not having any lower GI symptoms at the present time.  The patient will be set up for a upper endoscopy to evaluate the upper GI tract for any abnormalities as a cause for her symptoms.  The patient states that all of her symptoms started after she started chemotherapy.  The patient has been explained the plan and agrees with it.     2023, MD. Midge Minium    Note: This dictation was prepared with Dragon dictation along with smaller phrase technology. Any transcriptional errors that result from this process are unintentional.

## 2022-10-03 NOTE — Progress Notes (Signed)
Primary Care Physician: Carlean Jews, PA-C  Primary Gastroenterologist:  Dr. Midge Minium  Chief Complaint  Patient presents with   Bloated   Loss of Appetite   Gastroesophageal Reflux    HPI: Michelle Moore is a 67 y.o. female here with a history of seeing me with a colonoscopy in 2022 for chronic idiopathic constipation.  The patient had multiple polyps that time and was recommended to have a repeat colonoscopy in 3 years.  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  The patient was referred to see me due to abdominal pain and reflux and the patient was told to continue omeprazole and follow-up with me.  The patient had a CT scan in November that showed:  CT ABDOMEN AND PELVIS IMPRESSION  1. No typical findings of metastatic disease in the abdomen or pelvis. 2. Air within the urinary bladder with irregularity involving the posterior right paramidline wall, possibly related to the clinical history of TURBT. Correlate with more recent instrumentations and symptoms which could suggest cystitis or fistulous communication to bowel. 3. Hypoattenuation adjacent the falciform ligament is favored to represent focal steatosis. Recommend attention on follow-up. 4. Right adrenal adenoma 5. Caudate and lateral segment left liver lobe enlargement are nonspecific but can be seen with early/mild cirrhosis.  Despite the patient's findings of nonspecific left lobe of the liver enlargement the patient has had no sign of thrombocytopenia, hypoalbuminemia or abnormal liver enzymes to suggest cirrhosis. She reports that she has cramps after she eats and sometimes food gets stuck in the esophagus. The omprole makes it better and she sometimes has to take 2. Lat year she was 149 Lbs and ow she is 125 lbs  Past Medical History:  Diagnosis Date   Adenoma of right adrenal gland 12/07/2021   a.) CT chest 12/07/2021; measured 2.3 cm; not FDG avid on  01/06/2022 PET CT.   Anxiety    a.) uses BZO (alprazolam) PRN   Aortic atherosclerosis (HCC)    Arthritis    Bladder mass 12/07/2021   a.) CT abd 12/07/2021 --> 9 x 8 mm nodular enhancing focus at the base of the bladder; suspicious for urothelial neoplasm.   COPD (chronic obstructive pulmonary disease) (HCC)    Coronary artery disease 10/16/2009   a.) LHC/PCI 10/16/2009: EF 60%; 50% OM1, 99% dRCA (3.0 x 18 mm Xience V DES). b.) LHC 07/31/2014: EF 65%; 40% pLAD, 20% pLCx, 40% OM1, 30% oRCA, 50% pRCA, 20% mRCA, 10% ISR stent to dRCA; further intervention deferred opting for med. mgmt.   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 b.) s/p lumpectomy, adjuvant XRT; currently on extended AI (anastrozole) therapy   Essential hypertension    GERD (gastroesophageal reflux disease)    HLD (hyperlipidemia)    Left thyroid nodule 12/07/2021   a.) CT chest; measured 2.6 cm   Long term current use of immunosuppressive drug    a.) MTX for RA.   OSA on CPAP    Osteopenia    Personal history of radiation therapy    Pneumonia    PRES (posterior reversible encephalopathy syndrome) 07/14/2014   Pulmonary nodules 11/08/2021   a.) CXR 11/08/2021: 40mm nodular density RUL. b.) CT chest 12/07/2021: 1.9 x 1.4 x 2.5 spiculated lesion anterior RUL. c.) PET CT 01/06/2022: 2.2 x 1.9  spiculated anterior RUL mass (SUV max 16.4); d.) Bx (+) NSCLC, favoring SCC with tumor necrosis; p40/CK7/GATA3 (+), TTF-1 (-)   Rheumatoid arthritis (HCC)    a.) on MTX   Seasonal allergies    Seizures (HCC) 2014   a.) x 1 episode; etiology never determined.   Sinus headache    Squamous cell lung cancer, right (HCC) 03/17/2022   a.) RUL lobectomy 03/17/2022 --> Bx (+) for invasive moderately differentiated SCC of the RIGHT upper lobe (PDL-1: 5%) --> stage IIIA SCC (pT3, pN1, cM0)   ST elevation  myocardial infarction (STEMI) of inferior wall (HCC) 10/16/2009   a.) LHC/PCI 10/16/2009: EF 60%; 50% OM1, 99% dRCA (3.0 x 18 mm Xience V DES).   Tubular adenoma    Valvular heart disease     Current Outpatient Medications  Medication Sig Dispense Refill   albuterol (PROVENTIL) (2.5 MG/3ML) 0.083% nebulizer solution Take 3 mLs (2.5 mg total) by nebulization every 6 (six) hours as needed for wheezing or shortness of breath. 125 mL 2   albuterol (VENTOLIN HFA) 108 (90 Base) MCG/ACT inhaler Inhale 1-2 puffs into the lungs every 6 (six) hours as needed for wheezing or shortness of breath. 1 g 3   alendronate (FOSAMAX) 70 MG tablet Take 1 tablet (70 mg total) by mouth once a week. Take with a full glass of water on an empty stomach. Stand or sit upright for 1 hour after taking (Patient taking differently: Take 70 mg by mouth once a week. Take with a full glass of water on an empty stomach. Stand or sit upright for 1 hour after taking WEDNESDAY) 12 tablet 1   ALPRAZolam (XANAX) 0.25 MG tablet TAKE 1-2 TABLET BY MOUTH DAILY AT BEDTIME AS NEEDED FOR ANXIETY 60 tablet 1   anastrozole (ARIMIDEX) 1 MG tablet Take 1 tablet (1 mg total) by mouth at bedtime. 30 tablet 10   aspirin-acetaminophen-caffeine (EXCEDRIN MIGRAINE) 250-250-65 MG tablet Take by mouth every 6 (six) hours as needed for headache.     azithromycin (ZITHROMAX Z-PAK) 250 MG tablet Take 2 tablets on day 1, and one tablet from days 2-5 6 each 0   fluconazole (DIFLUCAN) 150 MG tablet Take 1 tab po once and may repeat in 3 days if symptoms persist 3 tablet 0   fluticasone (FLONASE) 50 MCG/ACT nasal spray Place 2 sprays into both nostrils daily as needed for allergies. 60 mL 1   fluticasone-salmeterol (ADVAIR) 250-50 MCG/ACT AEPB Inhale 1 puff into the lungs in the morning and at bedtime. 60 each 5   folic acid (FOLVITE) 1 MG tablet Take 2 mg by mouth in the morning.     gabapentin (NEURONTIN) 300 MG capsule Take 1 capsule (300 mg total) by mouth  2 (two) times daily. 60 capsule 6   HYDROcodone-acetaminophen (NORCO/VICODIN) 5-325 MG tablet Take 1-2 tablets by mouth every 6 (six) hours as needed for moderate pain. 10 tablet 0   magnesium oxide (MAG-OX) 400 (240 Mg) MG tablet Take 1 tablet (400 mg total) by mouth daily. 30 tablet 0   methotrexate (RHEUMATREX) 2.5 MG tablet Take 20 mg by mouth once a week. WEDNESDAY     montelukast (SINGULAIR) 10 MG tablet TAKE (1) TABLET BY MOUTH DAILY AT BEDTIME 90 tablet 1   OLANZapine (ZYPREXA) 10 MG tablet Take 1 tablet (10 mg total) by mouth at bedtime. For nausea 30 tablet 0   ondansetron (ZOFRAN) 8 MG tablet Take 1 tablet (8 mg total) by mouth every 8 (eight) hours as  needed for nausea or vomiting. 60 tablet 0   oxybutynin (DITROPAN) 5 MG tablet Take 1 tablet (5 mg total) by mouth every 8 (eight) hours as needed for bladder spasms. 30 tablet 0   pantoprazole (PROTONIX) 40 MG tablet Take 1 tablet (40 mg total) by mouth daily. 30 tablet 3   potassium chloride (KLOR-CON) 10 MEQ tablet TAKE (1) TABLET BY MOUTH EVERY DAY 90 tablet 1   rosuvastatin (CRESTOR) 40 MG tablet Take 40 mg by mouth every evening.     sertraline (ZOLOFT) 100 MG tablet TAKE (1) TABLET BY MOUTH EVERY DAY 90 tablet 1   simethicone (MYLICON) 125 MG chewable tablet Chew 125 mg by mouth every 6 (six) hours as needed for flatulence.     traMADol (ULTRAM) 50 MG tablet      traMADol (ULTRAM) 50 MG tablet Take 1-2 tablets (50-100 mg total) by mouth 3 (three) times daily as needed. 60 tablet 0   No current facility-administered medications for this visit.    Allergies as of 10/03/2022 - Review Complete 10/03/2022  Allergen Reaction Noted   Other  02/15/2018    ROS:  General: Negative for anorexia, weight loss, fever, chills, fatigue, weakness. ENT: Negative for hoarseness, difficulty swallowing , nasal congestion. CV: Negative for chest pain, angina, palpitations, dyspnea on exertion, peripheral edema.  Respiratory: Negative for  dyspnea at rest, dyspnea on exertion, cough, sputum, wheezing.  GI: See history of present illness. GU:  Negative for dysuria, hematuria, urinary incontinence, urinary frequency, nocturnal urination.  Endo: Negative for unusual weight change.    Physical Examination:   BP 118/79 (BP Location: Left Arm, Patient Position: Sitting, Cuff Size: Normal)   Pulse 88   Temp 97.9 F (36.6 C) (Oral)   Ht 5\' 2"  (1.575 m)   Wt 125 lb (56.7 kg)   BMI 22.86 kg/m   General: Well-nourished, well-developed in no acute distress.  Eyes: No icterus. Conjunctivae pink. Lungs: Clear to auscultation bilaterally. Non-labored. Heart: Regular rate and rhythm, no murmurs rubs or gallops.  Abdomen: Bowel sounds are normal, nontender, nondistended, no hepatosplenomegaly or masses, no abdominal bruits or hernia , no rebound or guarding.   Extremities: No lower extremity edema. No clubbing or deformities. Neuro: Alert and oriented x 3.  Grossly intact. Skin: Warm and dry, no jaundice.   Psych: Alert and cooperative, normal mood and affect.  Labs:    Imaging Studies: No results found.  Assessment and Plan:   Michelle Moore is a 67 y.o. y/o female who comes in today with a report of intermittent dysphagia, postprandial abdominal pain and weight loss.  The patient had a colonoscopy with a few polyps back in 2022 with a recommended repeat colonoscopy in 3 years.  She is not having any lower GI symptoms at the present time.  The patient will be set up for a upper endoscopy to evaluate the upper GI tract for any abnormalities as a cause for her symptoms.  The patient states that all of her symptoms started after she started chemotherapy.  The patient has been explained the plan and agrees with it.     2023, MD. Midge Minium    Note: This dictation was prepared with Dragon dictation along with smaller phrase technology. Any transcriptional errors that result from this process are unintentional.

## 2022-10-05 ENCOUNTER — Other Ambulatory Visit: Payer: Self-pay | Admitting: *Deleted

## 2022-10-05 ENCOUNTER — Encounter: Payer: Self-pay | Admitting: Pulmonary Disease

## 2022-10-05 MED ORDER — CHLORHEXIDINE GLUCONATE 0.12 % MT SOLN
OROMUCOSAL | Status: AC
  Filled 2022-10-05: qty 15

## 2022-10-05 MED ORDER — ALENDRONATE SODIUM 70 MG PO TABS: 70.0000 mg | ORAL_TABLET | ORAL | 1 refills | Status: DC

## 2022-10-05 NOTE — Telephone Encounter (Signed)
Patient is requesting update on scheduling sleep study.   Rodena Piety, please advise. Thanks

## 2022-10-06 NOTE — Telephone Encounter (Signed)
I have 11 more patients to scheduled to pick up HST machine before I get to her

## 2022-10-07 ENCOUNTER — Ambulatory Visit
Admission: RE | Admit: 2022-10-07 | Discharge: 2022-10-07 | Disposition: A | Discharge status: Home / Self Care | Payer: Medicare HMO | Attending: Gastroenterology | Admitting: Gastroenterology

## 2022-10-07 ENCOUNTER — Encounter
Admission: RE | Disposition: A | Discharge status: Home / Self Care | Payer: Self-pay | Source: Home / Self Care | Attending: Gastroenterology

## 2022-10-07 ENCOUNTER — Other Ambulatory Visit: Payer: Self-pay

## 2022-10-07 ENCOUNTER — Encounter: Payer: Self-pay | Admitting: Gastroenterology

## 2022-10-07 ENCOUNTER — Ambulatory Visit: Payer: Medicare HMO | Admitting: Anesthesiology

## 2022-10-07 DIAGNOSIS — I2510 Atherosclerotic heart disease of native coronary artery without angina pectoris: Secondary | ICD-10-CM | POA: Insufficient documentation

## 2022-10-07 DIAGNOSIS — I252 Old myocardial infarction: Secondary | ICD-10-CM | POA: Diagnosis not present

## 2022-10-07 DIAGNOSIS — Z85118 Personal history of other malignant neoplasm of bronchus and lung: Secondary | ICD-10-CM | POA: Diagnosis not present

## 2022-10-07 DIAGNOSIS — D3501 Benign neoplasm of right adrenal gland: Secondary | ICD-10-CM | POA: Insufficient documentation

## 2022-10-07 DIAGNOSIS — J449 Chronic obstructive pulmonary disease, unspecified: Secondary | ICD-10-CM | POA: Diagnosis not present

## 2022-10-07 DIAGNOSIS — I10 Essential (primary) hypertension: Secondary | ICD-10-CM | POA: Insufficient documentation

## 2022-10-07 DIAGNOSIS — R1310 Dysphagia, unspecified: Secondary | ICD-10-CM

## 2022-10-07 DIAGNOSIS — Z955 Presence of coronary angioplasty implant and graft: Secondary | ICD-10-CM | POA: Insufficient documentation

## 2022-10-07 DIAGNOSIS — Z79811 Long term (current) use of aromatase inhibitors: Secondary | ICD-10-CM | POA: Diagnosis not present

## 2022-10-07 DIAGNOSIS — R1013 Epigastric pain: Secondary | ICD-10-CM

## 2022-10-07 DIAGNOSIS — E785 Hyperlipidemia, unspecified: Secondary | ICD-10-CM | POA: Diagnosis not present

## 2022-10-07 DIAGNOSIS — K2950 Unspecified chronic gastritis without bleeding: Secondary | ICD-10-CM | POA: Insufficient documentation

## 2022-10-07 DIAGNOSIS — K219 Gastro-esophageal reflux disease without esophagitis: Secondary | ICD-10-CM | POA: Insufficient documentation

## 2022-10-07 DIAGNOSIS — K2100 Gastro-esophageal reflux disease with esophagitis, without bleeding: Secondary | ICD-10-CM | POA: Diagnosis not present

## 2022-10-07 DIAGNOSIS — Z87891 Personal history of nicotine dependence: Secondary | ICD-10-CM | POA: Diagnosis not present

## 2022-10-07 HISTORY — PX: ESOPHAGOGASTRODUODENOSCOPY (EGD) WITH PROPOFOL: SHX5813

## 2022-10-07 SURGERY — ESOPHAGOGASTRODUODENOSCOPY (EGD) WITH PROPOFOL
Anesthesia: General | Site: Mouth

## 2022-10-07 MED ORDER — STERILE WATER FOR IRRIGATION IR SOLN
Status: DC | PRN
  Administered 2022-10-07: 50 mL

## 2022-10-07 MED ORDER — SODIUM CHLORIDE 0.9 % IV SOLN: INTRAVENOUS | Status: DC

## 2022-10-07 MED ORDER — PROPOFOL 10 MG/ML IV BOLUS: INTRAVENOUS | Status: DC | PRN

## 2022-10-07 MED ORDER — PROPOFOL 10 MG/ML IV BOLUS
INTRAVENOUS | Status: DC | PRN
  Administered 2022-10-07: 40 mg via INTRAVENOUS
  Administered 2022-10-07: 80 mg via INTRAVENOUS

## 2022-10-07 MED ORDER — LIDOCAINE HCL (CARDIAC) PF 100 MG/5ML IV SOSY
PREFILLED_SYRINGE | INTRAVENOUS | Status: DC | PRN
  Administered 2022-10-07: 100 mg via INTRAVENOUS

## 2022-10-07 MED ORDER — LACTATED RINGERS IV SOLN: INTRAVENOUS | Status: DC

## 2022-10-07 SURGICAL SUPPLY — 8 items
BLOCK BITE 60FR ADLT L/F GRN (MISCELLANEOUS) ×1 IMPLANT
FORCEPS BIOP RAD 4 LRG CAP 4 (CUTTING FORCEPS) IMPLANT
GOWN CVR UNV OPN BCK APRN NK (MISCELLANEOUS) ×2 IMPLANT
GOWN ISOL THUMB LOOP REG UNIV (MISCELLANEOUS) ×2
KIT PRC NS LF DISP ENDO (KITS) ×1 IMPLANT
KIT PROCEDURE OLYMPUS (KITS) ×1
MANIFOLD NEPTUNE II (INSTRUMENTS) ×1 IMPLANT
WATER STERILE IRR 250ML POUR (IV SOLUTION) ×1 IMPLANT

## 2022-10-07 NOTE — Anesthesia Postprocedure Evaluation (Signed)
Anesthesia Post Note  Patient: CHERRE KOTHARI  Procedure(s) Performed: ESOPHAGOGASTRODUODENOSCOPY (EGD) WITH PROPOFOL (Mouth)  Patient location during evaluation: PACU Anesthesia Type: General Level of consciousness: awake and alert Pain management: pain level controlled Vital Signs Assessment: post-procedure vital signs reviewed and stable Respiratory status: spontaneous breathing, nonlabored ventilation, respiratory function stable and patient connected to nasal cannula oxygen Cardiovascular status: blood pressure returned to baseline and stable Postop Assessment: no apparent nausea or vomiting Anesthetic complications: no  No notable events documented.   Last Vitals:  Vitals:   10/07/22 1138 10/07/22 1147  BP: (!) 100/56 (!) 97/52  Pulse: 68 82  Resp: 18 16  Temp: (!) 36.1 C (!) 36.4 C  SpO2: 95% 96%    Last Pain:  Vitals:   10/07/22 1147  TempSrc:   PainSc: 0-No pain                 Dimas Millin

## 2022-10-07 NOTE — Interval H&P Note (Signed)
Lucilla Lame, MD [address] [place], [place] [address] Phone:[phone] Fax : [phone]  Primary Care Physician:  Mylinda Latina, PA-C Primary Gastroenterologist:  Dr. Allen Norris  Pre-Procedure History & Physical: HPI:  Michelle Moore is a 67 y.o. female is here for an endoscopy.   Past Medical History:  Diagnosis Date   Adenoma of right adrenal gland 12/07/2021   a.) CT chest 12/07/2021; measured 2.3 cm; not FDG avid on 01/06/2022 PET CT.   Anxiety    a.) uses BZO (alprazolam) PRN   Aortic atherosclerosis (HCC)    Arthritis    Bladder mass 12/07/2021   a.) CT abd 12/07/2021 --> 9 x 8 mm nodular enhancing focus at the base of the bladder; suspicious for urothelial neoplasm.   COPD (chronic obstructive pulmonary disease) (HCC)    Coronary artery disease 10/16/2009   a.) LHC/PCI 10/16/2009: EF 60%; 50% OM1, 99% dRCA (3.0 x 18 mm Xience V DES). b.) LHC 07/31/2014: EF 65%; 40% pLAD, 20% pLCx, 40% OM1, 30% oRCA, 50% pRCA, 20% mRCA, 10% ISR stent to dRCA; further intervention deferred opting for med. mgmt.   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 b.) s/p lumpectomy, adjuvant XRT; currently on extended AI (anastrozole) therapy   Essential hypertension    GERD (gastroesophageal reflux disease)    HLD (hyperlipidemia)    Left thyroid nodule 12/07/2021   a.) CT chest; measured 2.6 cm   Long term current use of immunosuppressive drug    a.) MTX for RA.   OSA on CPAP    currently not using   Osteopenia    Personal history of radiation therapy    Pneumonia    PRES (posterior reversible encephalopathy syndrome) 07/14/2014   Pulmonary nodules 11/08/2021   a.) CXR 11/08/2021: 20mm nodular density RUL. b.) CT chest 12/07/2021: 1.9 x 1.4 x 2.5 spiculated lesion anterior RUL. c.) PET CT 01/06/2022: 2.2 x 1.9 spiculated anterior RUL mass  (SUV max 16.4); d.) Bx (+) NSCLC, favoring SCC with tumor necrosis; p40/CK7/GATA3 (+), TTF-1 (-)   Rheumatoid arthritis ([place])    a.) on MTX   Seasonal allergies    Seizures ([place]) 2014   a.) x 1 episode; etiology never determined.   Sinus headache    Squamous cell lung cancer, right ([place]) 03/17/2022   a.) RUL lobectomy 03/17/2022 --> Bx (+) for invasive moderately differentiated SCC of the RIGHT upper lobe (PDL-1: 5%) --> stage IIIA SCC (pT3, pN1, cM0)   ST elevation myocardial infarction (STEMI) of inferior wall ([place]) 10/16/2009   a.) LHC/PCI 10/16/2009: EF 60%; 50% OM1, 99% dRCA (3.0 x 18 mm Xience V DES).   Tubular adenoma    Valvular heart disease     Past Surgical History:  Procedure Laterality Date   ABDOMINAL HYSTERECTOMY     ANTERIOR CERVICAL DECOMP/DISCECTOMY FUSION N/A 01/20/2014   Procedure: ANTERIOR CERVICAL DECOMPRESSION/DISCECTOMY FUSION 2 LEVELS cervical five/six six Tarry Kos;  Surgeon: Ophelia Charter, MD;  Location: [facility];  Service: Neurosurgery;  Laterality: N/A;   APPENDECTOMY     BACK SURGERY     lumbar   BLADDER INSTILLATION N/A 05/02/2022   Procedure: BLADDER INSTILLATION OF GEMCITABINE;  Surgeon: Hollice Espy, MD;  Location: [facility];  Service: Urology;  Laterality: N/A;   BREAST BIOPSY Right 02/01/2018   x shape, DCIS  BREAST BIOPSY  01/16/2019   coil, BENIGN MAMMARY TISSUE WITH MODERATE STROMAL FIBROSIS AND COARSE DYSTROPHIC CALCIFICATIONS of the RIGHT breast   BREAST LUMPECTOMY Right 03/07/2018   Procedure: BREAST LUMPECTOMY/ RE EXCISION;  Surgeon: Herbert Pun, MD;  Location: [facility];  Service: General;  Laterality: Right;   CARDIAC CATHETERIZATION Left 07/31/2014   Procedure: CARDIAC CATHETERIZATION; Location: [facility]; Surgeon: Serafina Royals, MD   COLONOSCOPY WITH PROPOFOL N/A 02/11/2021   Procedure: COLONOSCOPY WITH PROPOFOL;  Surgeon: Lucilla Lame, MD;  Location: [facility];  Service: Endoscopy;  Laterality: N/A;  Requests  Early   CORONARY ANGIOPLASTY WITH STENT PLACEMENT Left 10/16/2009   Procedure: CORONARY ANGIOPLASTY WITH STENT PLACEMENT; Location: [facility]; Surgeon: Katrine Coho, MD   CYSTOSCOPY W/ RETROGRADES Bilateral 05/02/2022   Procedure: CYSTOSCOPY WITH RETROGRADE PYELOGRAM;  Surgeon: Hollice Espy, MD;  Location: [facility];  Service: Urology;  Laterality: Bilateral;   INTERCOSTAL NERVE BLOCK Right 03/17/2022   Procedure: INTERCOSTAL NERVE BLOCK;  Surgeon: Melrose Nakayama, MD;  Location: [facility];  Service: Thoracic;  Laterality: Right;   LASIK Bilateral    LUNG LOBECTOMY Right 03/17/2022   right upper lobe   LYMPH NODE DISSECTION Right 03/17/2022   Procedure: LYMPH NODE DISSECTION;  Surgeon: Melrose Nakayama, MD;  Location: [facility];  Service: Thoracic;  Laterality: Right;   PARTIAL MASTECTOMY WITH NEEDLE LOCALIZATION Right 02/21/2018   Procedure: PARTIAL MASTECTOMY WITH NEEDLE LOCALIZATION;  Surgeon: Herbert Pun, MD;  Location: [facility];  Service: General;  Laterality: Right;   POLYPECTOMY N/A 02/11/2021   Procedure: POLYPECTOMY;  Surgeon: Lucilla Lame, MD;  Location: [facility];  Service: Endoscopy;  Laterality: N/A;   ROTATOR CUFF REPAIR Left    SHOULDER ARTHROSCOPY WITH ROTATOR CUFF REPAIR AND OPEN BICEPS TENODESIS Right 08/07/2019   Procedure: SHOULDER ARTHROSCOPY WITH DEBRIDEMENT, DECOMPRESSION, ROTATOR CUFF REPAIR AND BICEPS TENODESIS. - RNFA;  Surgeon: Corky Mull, MD;  Location: [facility];  Service: Orthopedics;  Laterality: Right;   TOE SURGERY Right    has pin and plate in it   TRANSURETHRAL RESECTION OF BLADDER TUMOR N/A 05/02/2022   Procedure: TRANSURETHRAL RESECTION OF BLADDER TUMOR (TURBT);  Surgeon: Hollice Espy, MD;  Location: [facility];  Service: Urology;  Laterality: N/A;   VIDEO BRONCHOSCOPY WITH ENDOBRONCHIAL ULTRASOUND N/A 03/02/2022   Procedure: VIDEO BRONCHOSCOPY WITH ENDOBRONCHIAL ULTRASOUND;  Surgeon: Tyler Pita, MD;  Location: [facility];   Service: Cardiopulmonary;  Laterality: N/A;    Prior to Admission medications   Medication Sig Start Date End Date Taking? Authorizing Provider  albuterol (PROVENTIL) (2.5 MG/3ML) 0.083% nebulizer solution Take 3 mLs (2.5 mg total) by nebulization every 6 (six) hours as needed for wheezing or shortness of breath. 06/24/22 06/24/23 Yes Tyler Pita, MD  albuterol (VENTOLIN HFA) 108 (90 Base) MCG/ACT inhaler Inhale 1-2 puffs into the lungs every 6 (six) hours as needed for wheezing or shortness of breath. 09/01/22  Yes Tyler Pita, MD  alendronate (FOSAMAX) 70 MG tablet Take 1 tablet (70 mg total) by mouth once a week. Take with a full glass of water on an empty stomach. Stand or sit upright for 1 hour after taking 10/05/22  Yes Finnegan, Kathlene November, MD  ALPRAZolam Duanne Moron) 0.25 MG tablet TAKE 1-2 TABLET BY MOUTH DAILY AT BEDTIME AS NEEDED FOR ANXIETY 07/25/22  Yes McDonough, Lauren K, PA-C  anastrozole (ARIMIDEX) 1 MG tablet Take 1 tablet (1 mg total) by mouth at bedtime. 06/15/22  Yes Lloyd Huger, MD  aspirin-acetaminophen-caffeine ([place])  250-250-65 MG tablet Take by mouth every 6 (six) hours as needed for headache.   Yes [provider]  fluticasone (FLONASE) 50 MCG/ACT nasal spray Place 2 sprays into both nostrils daily as needed for allergies. 09/09/19  Yes Boscia, Greer Ee, NP  fluticasone-salmeterol (ADVAIR) 250-50 MCG/ACT AEPB Inhale 1 puff into the lungs in the morning and at bedtime. 05/05/22  Yes Lavera Guise, MD  folic acid (FOLVITE) 1 MG tablet Take 2 mg by mouth in the morning. 02/17/22  Yes [provider]  gabapentin (NEURONTIN) 300 MG capsule Take 1 capsule (300 mg total) by mouth 2 (two) times daily. 04/05/22  Yes Melrose Nakayama, MD  methotrexate (RHEUMATREX) 2.5 MG tablet Take 20 mg by mouth once a week. [facility] 04/05/22  Yes [provider]  montelukast (SINGULAIR) 10 MG tablet TAKE (1) TABLET BY MOUTH DAILY AT BEDTIME  07/26/22  Yes McDonough, Lauren K, PA-C  omeprazole (PRILOSEC) 20 MG capsule Take 20 mg by mouth daily.   Yes [provider]  potassium chloride (KLOR-CON) 10 MEQ tablet TAKE (1) TABLET BY MOUTH EVERY DAY 03/14/22  Yes McDonough, Lauren K, PA-C  rosuvastatin (CRESTOR) 40 MG tablet Take 40 mg by mouth every evening. 02/28/22  Yes [provider]  sertraline (ZOLOFT) 100 MG tablet TAKE (1) TABLET BY MOUTH EVERY DAY 07/25/22  Yes McDonough, Lauren K, PA-C  traMADol (ULTRAM) 50 MG tablet Take 1-2 tablets (50-100 mg total) by mouth 3 (three) times daily as needed. 05/17/22  Yes Borders, Kirt Boys, NP  fluconazole (DIFLUCAN) 150 MG tablet Take 1 tab po once and may repeat in 3 days if symptoms persist Patient not taking: Reported on 10/03/2022 06/14/22   Lavera Guise, MD  HYDROcodone-acetaminophen (NORCO/VICODIN) 5-325 MG tablet Take 1-2 tablets by mouth every 6 (six) hours as needed for moderate pain. Patient not taking: Reported on 10/03/2022 06/07/22   Verlon Au, NP  magnesium oxide (MAG-OX) 400 (240 Mg) MG tablet Take 1 tablet (400 mg total) by mouth daily. Patient not taking: Reported on 10/03/2022 06/30/22   Borders, Kirt Boys, NP  OLANZapine (ZYPREXA) 10 MG tablet Take 1 tablet (10 mg total) by mouth at bedtime. For nausea Patient not taking: Reported on 10/03/2022 06/07/22   Verlon Au, NP  ondansetron (ZOFRAN) 8 MG tablet Take 1 tablet (8 mg total) by mouth every 8 (eight) hours as needed for nausea or vomiting. Patient not taking: Reported on 10/03/2022 06/07/22   Verlon Au, NP  oxybutynin (DITROPAN) 5 MG tablet Take 1 tablet (5 mg total) by mouth every 8 (eight) hours as needed for bladder spasms. Patient not taking: Reported on 10/03/2022 05/02/22   Hollice Espy, MD  pantoprazole (PROTONIX) 40 MG tablet Take 1 tablet (40 mg total) by mouth daily. Patient not taking: Reported on 10/03/2022 06/30/22   Borders, Kirt Boys, NP  simethicone (MYLICON) 950 MG chewable tablet  Chew 125 mg by mouth every 6 (six) hours as needed for flatulence. Patient not taking: Reported on 10/03/2022    [provider]  prochlorperazine (COMPAZINE) 10 MG tablet Take 1 tablet (10 mg total) by mouth every 6 (six) hours as needed (Nausea or vomiting). 04/08/22 04/29/22  Lloyd Huger, MD    Allergies as of 10/03/2022 - Review Complete 10/03/2022  Allergen Reaction Noted   Other  02/15/2018    Family History  Problem Relation Age of Onset   Breast cancer Cousin 65       bilateral at  73 and 64; daughter of maternal aunt who was unaffected   Lung cancer Maternal Aunt        dx 108s; deceased 57s; smoker   Heart attack Father        deceased 75   Stomach cancer Maternal Grandmother     Social History   Socioeconomic History   Marital status: Married    Spouse name: Jimmy   Number of children: 1   Years of education: Not on file   Highest education level: Not on file  Occupational History   Not on file  Tobacco Use   Smoking status: Former    Packs/day: 1.00    Years: 30.00    Total pack years: 30.00    Types: Cigarettes    Quit date: 09/13/2015    Years since quitting: 7.0   Smokeless tobacco: Never  Vaping Use   Vaping Use: Never used  Substance and Sexual Activity   Alcohol use: Yes    Comment: occasional   Drug use: No   Sexual activity: Not on file  Other Topics Concern   Not on file  Social History Narrative   Not on file   Social Determinants of Health   Financial Resource Strain: Low Risk  (03/31/2022)   Overall Financial Resource Strain (CARDIA)    Difficulty of Paying Living Expenses: Not very hard  Food Insecurity: No Food Insecurity (05/23/2022)   Hunger Vital Sign    Worried About Running Out of Food in the Last Year: Never true    [place] in the Last Year: Never true  Transportation Needs: No Transportation Needs (06/30/2022)   PRAPARE - Hydrologist (Medical): No    Lack of Transportation  (Non-Medical): No  Physical Activity: Inactive (03/31/2022)   Exercise Vital Sign    Days of Exercise per Week: 0 days    Minutes of Exercise per Session: 0 min  Stress: No Stress Concern Present (03/31/2022)   [place]    Feeling of Stress : Only a little  Social Connections: Socially Integrated (03/31/2022)   Social Connection and Isolation Panel [NHANES]    Frequency of Communication with Friends and Family: Three times a week    Frequency of Social Gatherings with Friends and Family: Three times a week    Attends Religious Services: More than 4 times per year    Active Member of Clubs or Organizations: Yes    Attends Archivist Meetings: More than 4 times per year    Marital Status: Married  Human resources officer Violence: Not At Risk (03/31/2022)   Humiliation, Afraid, Rape, and Kick questionnaire    Fear of Current or Ex-Partner: No    Emotionally Abused: No    Physically Abused: No    Sexually Abused: No    Review of Systems: See HPI, otherwise negative ROS  Physical Exam: BP (!) 141/63   Temp (!) 97.3 F (36.3 C) (Tympanic)   Resp 17   Ht 5' (1.524 m)   Wt 57.7 kg   SpO2 94%   BMI 24.82 kg/m  General:   Alert,  pleasant and cooperative in NAD Head:  Normocephalic and atraumatic. Neck:  Supple; no masses or thyromegaly. Lungs:  Clear throughout to auscultation.    Heart:  Regular rate and rhythm. Abdomen:  Soft, nontender and nondistended. Normal bowel sounds, without guarding, and without rebound.   Neurologic:  Alert and  oriented x4;  grossly normal neurologically.  Impression/Plan: PRIMA RAYNER is here for an endoscopy to be performed for epigastric pain and dysphagia  Risks, benefits, limitations, and alternatives regarding  endoscopy have been reviewed with the patient.  Questions have been answered.  All parties agreeable.   Lucilla Lame, MD  10/07/2022, 10:35 AM

## 2022-10-07 NOTE — Anesthesia Preprocedure Evaluation (Addendum)
Anesthesia Evaluation  Patient identified by MRN, date of birth, ID band Patient awake    Reviewed: Allergy & Precautions, NPO status , Patient's Chart, lab work & pertinent test results  History of Anesthesia Complications Negative for: history of anesthetic complications  Airway Mallampati: III  TM Distance: >3 FB Neck ROM: full    Dental  (+) Missing, Chipped   Pulmonary sleep apnea and Continuous Positive Airway Pressure Ventilation , COPD,  COPD inhaler, former smoker   Pulmonary exam normal        Cardiovascular hypertension, + CAD, + Past MI and + DOE  Normal cardiovascular exam     Neuro/Psych  PSYCHIATRIC DISORDERS Anxiety Depression    negative neurological ROS     GI/Hepatic Neg liver ROS,GERD  ,,  Endo/Other  negative endocrine ROS    Renal/GU      Musculoskeletal   Abdominal   Peds  Hematology negative hematology ROS (+)   Anesthesia Other Findings PMH of lung resection  Past Medical History: 12/07/2021: Adenoma of right adrenal gland     Comment:  a.) CT chest 12/07/2021; measured 2.3 cm; not FDG avid               on 01/06/2022 PET CT. No date: Anxiety     Comment:  a.) uses BZO (alprazolam) PRN No date: Aortic atherosclerosis ([place]) No date: Arthritis 12/07/2021: Bladder mass     Comment:  a.) CT abd 12/07/2021 --> 9 x 8 mm nodular enhancing               focus at the base of the bladder; suspicious for               urothelial neoplasm. No date: COPD (chronic obstructive pulmonary disease) ([place]) 10/16/2009: Coronary artery disease     Comment:  a.) LHC/PCI 10/16/2009: EF 60%; 50% OM1, 99% dRCA (3.0 x              18 mm Xience V DES). b.) LHC 07/31/2014: EF 65%; 40%               pLAD, 20% pLCx, 40% OM1, 30% oRCA, 50% pRCA, 20% mRCA,               10% ISR stent to dRCA; further intervention deferred               opting for med. mgmt. No date: Depression 03/02/2022: Difficult  intubation No date: DOE (dyspnea on exertion) 02/01/2018: Ductal carcinoma in situ (DCIS) of right breast     Comment:  a.) high grade DCIS; comedo type. b.) s/p lumpectomy,               adjuvant XRT; currently on extended AI (anastrozole)               therapy No date: Essential hypertension No date: GERD (gastroesophageal reflux disease) No date: HLD (hyperlipidemia) 12/07/2021: Left thyroid nodule     Comment:  a.) CT chest; measured 2.6 cm No date: Long term current use of immunosuppressive drug     Comment:  a.) MTX for RA. No date: OSA on CPAP No date: Osteopenia No date: Personal history of radiation therapy No date: Pneumonia 07/14/2014: PRES (posterior reversible encephalopathy syndrome) 11/08/2021: Pulmonary nodules     Comment:  a.) CXR 11/08/2021: 76mm nodular density RUL. b.) CT               chest 12/07/2021: 1.9 x 1.4 x 2.5 spiculated lesion  anterior RUL. c.) PET CT 01/06/2022: 2.2 x 1.9 spiculated              anterior RUL mass (SUV max 16.4); d.) Bx (+) NSCLC,               favoring SCC with tumor necrosis; p40/CK7/GATA3 (+),               TTF-1 (-) No date: Rheumatoid arthritis ([place])     Comment:  a.) on MTX No date: Seasonal allergies 2014: Seizures ([place])     Comment:  a.) x 1 episode; etiology never determined. No date: Sinus headache 03/17/2022: Squamous cell lung cancer, right ([place])     Comment:  a.) RUL lobectomy 03/17/2022 --> Bx (+) for invasive               moderately differentiated SCC of the RIGHT upper lobe               (PDL-1: 5%) --> stage IIIA SCC (pT3, pN1, cM0) 10/16/2009: ST elevation myocardial infarction (STEMI) of inferior  wall (HCC)     Comment:  a.) LHC/PCI 10/16/2009: EF 60%; 50% OM1, 99% dRCA (3.0 x              18 mm Xience V DES). No date: Tubular adenoma No date: Valvular heart disease  Past Surgical History: No date: ABDOMINAL HYSTERECTOMY 01/20/2014: ANTERIOR CERVICAL DECOMP/DISCECTOMY FUSION; N/A      Comment:  Procedure: ANTERIOR CERVICAL DECOMPRESSION/DISCECTOMY               FUSION 2 LEVELS cervical five/six six Tarry Kos;  Surgeon:               Ophelia Charter, MD;  Location: [facility];  Service:              Neurosurgery;  Laterality: N/A; No date: APPENDECTOMY No date: BACK SURGERY     Comment:  lumbar 02/01/2018: BREAST BIOPSY; Right     Comment:  x shape, DCIS  01/16/2019: BREAST BIOPSY     Comment:  coil, BENIGN MAMMARY TISSUE WITH MODERATE STROMAL               FIBROSIS AND COARSE DYSTROPHIC CALCIFICATIONS of the               RIGHT breast 03/07/2018: BREAST LUMPECTOMY; Right     Comment:  Procedure: BREAST LUMPECTOMY/ RE EXCISION;  Surgeon:               Herbert Pun, MD;  Location: [facility];  Service:              General;  Laterality: Right; 07/31/2014: CARDIAC CATHETERIZATION; Left     Comment:  Procedure: CARDIAC CATHETERIZATION; Location: [facility];               Surgeon: Serafina Royals, MD 02/11/2021: COLONOSCOPY WITH PROPOFOL; N/A     Comment:  Procedure: COLONOSCOPY WITH PROPOFOL;  Surgeon: Lucilla Lame, MD;  Location: [facility];  Service:               Endoscopy;  Laterality: N/A;  Requests Early 10/16/2009: CORONARY ANGIOPLASTY WITH STENT PLACEMENT; Left     Comment:  Procedure: CORONARY ANGIOPLASTY WITH STENT PLACEMENT;               Location: [facility]; Surgeon: Katrine Coho, MD 03/17/2022: INTERCOSTAL NERVE BLOCK; Right  Comment:  Procedure: INTERCOSTAL NERVE BLOCK;  Surgeon:               Melrose Nakayama, MD;  Location: [facility];  Service:               Thoracic;  Laterality: Right; No date: LASIK; Bilateral 03/17/2022: LUNG LOBECTOMY; Right     Comment:  right upper lobe 03/17/2022: LYMPH NODE DISSECTION; Right     Comment:  Procedure: LYMPH NODE DISSECTION;  Surgeon: Melrose Nakayama, MD;  Location: [facility];  Service: Thoracic;                Laterality: Right; 02/21/2018: PARTIAL MASTECTOMY WITH  NEEDLE LOCALIZATION; Right     Comment:  Procedure: PARTIAL MASTECTOMY WITH NEEDLE LOCALIZATION;               Surgeon: Herbert Pun, MD;  Location: [facility];               Service: General;  Laterality: Right; 02/11/2021: POLYPECTOMY; N/A     Comment:  Procedure: POLYPECTOMY;  Surgeon: Lucilla Lame, MD;                Location: [facility];  Service: Endoscopy;                Laterality: N/A; No date: ROTATOR CUFF REPAIR; Left 08/07/2019: SHOULDER ARTHROSCOPY WITH ROTATOR CUFF REPAIR AND OPEN  BICEPS TENODESIS; Right     Comment:  Procedure: SHOULDER ARTHROSCOPY WITH DEBRIDEMENT,               DECOMPRESSION, ROTATOR CUFF REPAIR AND BICEPS TENODESIS.               - RNFA;  Surgeon: Corky Mull, MD;  Location: [facility];              Service: Orthopedics;  Laterality: Right; No date: TOE SURGERY; Right     Comment:  has pin and plate in it 14/48/1856: [place]; N/A     Comment:  Procedure: VIDEO BRONCHOSCOPY WITH ENDOBRONCHIAL               ULTRASOUND;  Surgeon: Tyler Pita, MD;  Location:               [facility];  Service: Cardiopulmonary;  Laterality: N/A;  BMI    Body Mass Index: 26.95 kg/m      Reproductive/Obstetrics negative OB ROS                             Anesthesia Physical Anesthesia Plan  ASA: 3  Anesthesia Plan: General   Post-op Pain Management: Minimal or no pain anticipated   Induction: Intravenous  PONV Risk Score and Plan: 3 and Propofol infusion, TIVA and Ondansetron  Airway Management Planned: Nasal Cannula  Additional Equipment: None  Intra-op Plan:   Post-operative Plan: Extubation in OR  Informed Consent: I have reviewed the patients History and Physical, chart, labs and discussed the procedure including the risks, benefits and alternatives for the proposed anesthesia with the patient or authorized representative who has indicated his/her understanding and  acceptance.     Dental advisory given  Plan Discussed with: CRNA and Surgeon  Anesthesia Plan Comments: (Discussed risks of anesthesia with patient, including possibility of difficulty with spontaneous ventilation under anesthesia necessitating airway intervention, PONV, and  rare risks such as cardiac or respiratory or neurological events, and allergic reactions. Discussed the role of CRNA in patient's perioperative care. Patient understands.)        Anesthesia Quick Evaluation

## 2022-10-07 NOTE — Op Note (Signed)
[facility] Gastroenterology Patient Name: Michelle Moore Procedure Date: 10/07/2022 10:58 AM MRN: [id number] Account #: [id number] Date of Birth: Jan 10, 1956 Admit Type: Outpatient Age: 67 Room: [facility] OR ROOM 01 Gender: Female Note Status: Finalized Instrument Name: [id number] Procedure:             Upper GI endoscopy Indications:           Epigastric abdominal pain, Dysphagia Providers:             Lucilla Lame MD, MD Referring MD:          Mylinda Latina (Referring MD) Medicines:             Propofol per Anesthesia Complications:         No immediate complications. Procedure:             Pre-Anesthesia Assessment:                        - Prior to the procedure, a History and Physical was                         performed, and patient medications and allergies were                         reviewed. The patient's tolerance of previous                         anesthesia was also reviewed. The risks and benefits                         of the procedure and the sedation options and risks                         were discussed with the patient. All questions were                         answered, and informed consent was obtained. Prior                         Anticoagulants: The patient has taken no anticoagulant                         or antiplatelet agents. ASA Grade Assessment: II - A                         patient with mild systemic disease. After reviewing                         the risks and benefits, the patient was deemed in                         satisfactory condition to undergo the procedure.                        After obtaining informed consent, the endoscope was                         passed under direct vision. Throughout the procedure,  the patient's blood pressure, pulse, and oxygen                         saturations were monitored continuously. The Endoscope                         was introduced through the mouth, and  advanced to the                         second part of duodenum. The upper GI endoscopy was                         accomplished without difficulty. The patient tolerated                         the procedure well. Findings:      The examined esophagus was normal.      The entire examined stomach was normal. Biopsies were taken with a cold       forceps for histology.      The examined duodenum was normal. Impression:            - Normal esophagus.                        - Normal stomach. Biopsied.                        - Normal examined duodenum.                        - No cause for the patients symptoms seen. Recommendation:        - Discharge patient to home.                        - Resume previous diet.                        - Continue present medications.                        - Await pathology results. Procedure Code(s):     --- Professional ---                        [phone], Esophagogastroduodenoscopy, flexible,                         transoral; with biopsy, single or multiple Diagnosis Code(s):     --- Professional ---                        R13.10, Dysphagia, unspecified                        R10.13, Epigastric pain CPT copyright 2022 American [employer]. All rights reserved. The codes documented in this report are preliminary and upon coder review may  be revised to meet current compliance requirements. Lucilla Lame MD, MD 10/07/2022 11:31:31 AM This report has been signed electronically. Number of Addenda: 0 Note Initiated On: 10/07/2022 10:58 AM Total Procedure Duration: 0 hours 3 minutes 1 second  Estimated Blood Loss:  Estimated blood loss: none.  [facility]

## 2022-10-07 NOTE — Transfer of Care (Signed)
Immediate Anesthesia Transfer of Care Note  Patient: Michelle Moore  Procedure(s) Performed: ESOPHAGOGASTRODUODENOSCOPY (EGD) WITH PROPOFOL (Mouth)  Patient Location: PACU  Anesthesia Type:General  Level of Consciousness: awake and sedated  Airway & Oxygen Therapy: Patient Spontanous Breathing  Post-op Assessment: Report given to RN  Post vital signs: Reviewed and stable  Last Vitals:  Vitals Value Taken Time  BP 100/56 10/07/22 1138  Temp 36.1 C 10/07/22 1138  Pulse 65 10/07/22 1141  Resp 14 10/07/22 1141  SpO2 95 % 10/07/22 1141  Vitals shown include unvalidated device data.  Last Pain:  Vitals:   10/07/22 1138  TempSrc:   PainSc: 0-No pain         Complications: No notable events documented.

## 2022-10-08 ENCOUNTER — Encounter: Payer: Self-pay | Admitting: Pulmonary Disease

## 2022-10-10 ENCOUNTER — Encounter: Payer: Self-pay | Admitting: Gastroenterology

## 2022-10-10 LAB — SURGICAL PATHOLOGY

## 2022-10-11 ENCOUNTER — Encounter: Payer: Self-pay | Admitting: Gastroenterology

## 2022-10-11 ENCOUNTER — Other Ambulatory Visit: Payer: Self-pay | Admitting: Physician Assistant

## 2022-10-11 DIAGNOSIS — E876 Hypokalemia: Secondary | ICD-10-CM

## 2022-10-11 DIAGNOSIS — F411 Generalized anxiety disorder: Secondary | ICD-10-CM

## 2022-10-12 ENCOUNTER — Telehealth: Payer: Medicare HMO | Admitting: Gastroenterology

## 2022-10-15 ENCOUNTER — Encounter: Payer: Self-pay | Admitting: Gastroenterology

## 2022-10-18 NOTE — Telephone Encounter (Signed)
I have spoke with Mrs. Bullen and she has been scheduled to pick up HST machine on 10/25/22 @ 2:00pm

## 2022-10-19 DIAGNOSIS — H524 Presbyopia: Secondary | ICD-10-CM | POA: Diagnosis not present

## 2022-10-19 DIAGNOSIS — Z0100 Encounter for examination of eyes and vision without abnormal findings: Secondary | ICD-10-CM | POA: Diagnosis not present

## 2022-10-20 ENCOUNTER — Encounter: Payer: Self-pay | Admitting: Pulmonary Disease

## 2022-10-21 MED ORDER — FLUTICASONE-SALMETEROL 250-50 MCG/ACT IN AEPB: 1.0000 | INHALATION_SPRAY | Freq: Two times a day (BID) | RESPIRATORY_TRACT | 5 refills | Status: DC

## 2022-10-25 ENCOUNTER — Ambulatory Visit: Payer: Medicare HMO

## 2022-10-25 ENCOUNTER — Encounter: Payer: Self-pay | Admitting: Urology

## 2022-10-25 ENCOUNTER — Ambulatory Visit: Payer: Medicare HMO | Admitting: Urology

## 2022-10-25 VITALS — BP 105/69 | HR 80 | Ht 60.0 in | Wt 124.0 lb

## 2022-10-25 DIAGNOSIS — D494 Neoplasm of unspecified behavior of bladder: Secondary | ICD-10-CM

## 2022-10-25 DIAGNOSIS — R82998 Other abnormal findings in urine: Secondary | ICD-10-CM | POA: Diagnosis not present

## 2022-10-25 DIAGNOSIS — Z8551 Personal history of malignant neoplasm of bladder: Secondary | ICD-10-CM

## 2022-10-25 DIAGNOSIS — G4733 Obstructive sleep apnea (adult) (pediatric): Secondary | ICD-10-CM

## 2022-10-25 DIAGNOSIS — R8290 Unspecified abnormal findings in urine: Secondary | ICD-10-CM | POA: Diagnosis not present

## 2022-10-25 LAB — URINALYSIS, COMPLETE
Bilirubin, UA: NEGATIVE
Glucose, UA: NEGATIVE
Ketones, UA: NEGATIVE
Nitrite, UA: NEGATIVE
Specific Gravity, UA: 1.02 (ref 1.005–1.030)
Urobilinogen, Ur: 0.2 mg/dL (ref 0.2–1.0)
pH, UA: 5.5 (ref 5.0–7.5)

## 2022-10-25 LAB — MICROSCOPIC EXAMINATION: WBC, UA: >30 /hpf — AB (ref 0–5)

## 2022-10-25 NOTE — Progress Notes (Signed)
   10/25/22  CC:  Chief Complaint  Patient presents with   Cysto    HPI: 67 year old female with personal history of presumed bladder cancer presents today for surveillance cystoscopy.  She has a personal history of lung cancer and underwent right upper lobe lobectomy found to have stage IIIa disease.  She completed adjuvant cisplatin and Taxotere in October 2023.  Most recent imaging was unremarkable.  Notably bladder cancer was identified incidentally as part of her workup for this.   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  TURBT was delayed for several issues including for lobectomy and initiation of chemo for new diagnosis of lung cancer.   Ultimately, she underwent TURBT on 05/02/2022.  Intraoperatively, the lesion was much smaller than the patient in the office and had almost an atrophic-like appearance.  She also received postoperative gemcitabine.   Surgical pathology was consistent mildly thickened surface urothelial mucosa with no dysplasia was appreciated.  Presumably involution of the tumor was related to Taxotere and cisplatin which she is receiving for lung cancer.  Her urinalysis is grossly positive today, see below.  She denies any overt urinary symptoms although has been having some low back pain.  No fevers or chills.  Results for orders placed or performed in visit on 10/25/22  Microscopic Examination   Urine  Result Value Ref Range   WBC, UA >30 (A) 0 - 5 /hpf   RBC, Urine 3-10 (A) 0 - 2 /hpf   Epithelial Cells (non renal) 0-10 0 - 10 /hpf   Casts Present (A) None seen /lpf   Cast Type Hyaline casts N/A   Crystals Present (A) N/A   Crystal Type Amorphous Sediment N/A   Mucus, UA Present (A) Not Estab.   Bacteria, UA Many (A) None seen/Few  Urinalysis, Complete  Result Value Ref Range   Specific Gravity, UA 1.020 1.005 -  1.030   pH, UA 5.5 5.0 - 7.5   Color, UA Yellow Yellow   Appearance Ur Hazy (A) Clear   Leukocytes,UA 2+ (A) Negative   Protein,UA 1+ (A) Negative/Trace   Glucose, UA Negative Negative   Ketones, UA Negative Negative   RBC, UA 1+ (A) Negative   Bilirubin, UA Negative Negative   Urobilinogen, Ur 0.2 0.2 - 1.0 mg/dL   Nitrite, UA Negative Negative   Microscopic Examination See below:      Blood pressure 101/66, pulse 93, height 5\' 2"  (1.575 m), weight 137 lb (62.1 kg). NED. A&Ox3.   No respiratory distress    Assessment/ Plan:  Acute cystitis-  -Urinalysis today is highly suspicious and distinctly different from her previous urinalyses.  In light of this, I recommended deferring cystoscopy today due to concern for impaired visualization, false positive as well as concern for worsening infection -Given that she is relatively minimally symptomatic or asymptomatic if her back pain is unrelated, will hold off on treating with antibiotics until her culture has resulted. -If she develops any urinary symptoms call sooner for antibiotics while awaiting culture results  Reschedule cystoscopy for in 2 to 3 weeks   Hollice Espy, MD

## 2022-10-27 ENCOUNTER — Encounter: Payer: Self-pay | Admitting: Urology

## 2022-10-28 DIAGNOSIS — G4733 Obstructive sleep apnea (adult) (pediatric): Secondary | ICD-10-CM | POA: Diagnosis not present

## 2022-10-31 ENCOUNTER — Other Ambulatory Visit: Payer: Self-pay | Admitting: Thoracic Surgery (Cardiothoracic Vascular Surgery)

## 2022-10-31 DIAGNOSIS — C3490 Malignant neoplasm of unspecified part of unspecified bronchus or lung: Secondary | ICD-10-CM

## 2022-10-31 LAB — CULTURE, URINE COMPREHENSIVE

## 2022-11-01 ENCOUNTER — Ambulatory Visit: Payer: Medicare HMO | Admitting: Thoracic Surgery (Cardiothoracic Vascular Surgery)

## 2022-11-01 ENCOUNTER — Encounter: Payer: Self-pay | Admitting: Thoracic Surgery (Cardiothoracic Vascular Surgery)

## 2022-11-01 ENCOUNTER — Ambulatory Visit
Admission: RE | Admit: 2022-11-01 | Discharge: 2022-11-01 | Disposition: A | Discharge status: Home / Self Care | Payer: Medicare HMO | Source: Ambulatory Visit | Attending: Thoracic Surgery (Cardiothoracic Vascular Surgery) | Admitting: Thoracic Surgery (Cardiothoracic Vascular Surgery)

## 2022-11-01 VITALS — BP 124/73 | HR 85 | Resp 20 | Ht 60.0 in | Wt 130.0 lb

## 2022-11-01 DIAGNOSIS — Z902 Acquired absence of lung [part of]: Secondary | ICD-10-CM

## 2022-11-01 DIAGNOSIS — C3491 Malignant neoplasm of unspecified part of right bronchus or lung: Secondary | ICD-10-CM

## 2022-11-01 DIAGNOSIS — J90 Pleural effusion, not elsewhere classified: Secondary | ICD-10-CM | POA: Diagnosis not present

## 2022-11-01 DIAGNOSIS — Z9889 Other specified postprocedural states: Secondary | ICD-10-CM | POA: Diagnosis not present

## 2022-11-01 DIAGNOSIS — C3490 Malignant neoplasm of unspecified part of unspecified bronchus or lung: Secondary | ICD-10-CM | POA: Diagnosis not present

## 2022-11-01 NOTE — Progress Notes (Signed)
[address]       [place] [address]             [phone]     HPI: Ms. Leap returns for a scheduled follow-up visit after prior right upper lobectomy.  Michelle Moore is a 67 year old woman with a history of stage IIIa lung cancer, tobacco abuse, COPD, CAD, MI, risk aortic atherosclerosis, hypertension, hyperlipidemia, reflux, anxiety, depression, bladder cancer, DCIS of breast, and a thyroid nodule.  She had an extrapleural right upper lobectomy on 03/17/2022.  Pathology showed a stage IIIa (T3, N1) squamous cell carcinoma.  She was seen by Dr. Grayland Ormond and underwent adjuvant chemotherapy.  She has been having a lot of gastrointestinal issues.  She recently had an EGD.  Also has recently had a respiratory infection.  Still has some pain associated with her incisions.  Currently taking gabapentin only at night and wants to discontinue altogether.  Past Medical History:  Diagnosis Date   Adenoma of right adrenal gland 12/07/2021   a.) CT chest 12/07/2021; measured 2.3 cm; not FDG avid on 01/06/2022 PET CT.   Anxiety    a.) uses BZO (alprazolam) PRN   Aortic atherosclerosis (HCC)    Arthritis    Bladder mass 12/07/2021   a.) CT abd 12/07/2021 --> 9 x 8 mm nodular enhancing focus at the base of the bladder; suspicious for urothelial neoplasm.   COPD (chronic obstructive pulmonary disease) (HCC)    Coronary artery disease 10/16/2009   a.) LHC/PCI 10/16/2009: EF 60%; 50% OM1, 99% dRCA (3.0 x 18 mm Xience V DES). b.) LHC 07/31/2014: EF 65%; 40% pLAD, 20% pLCx, 40% OM1, 30% oRCA, 50% pRCA, 20% mRCA, 10% ISR stent to dRCA; further intervention deferred opting for med. mgmt.   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 b.) s/p lumpectomy, adjuvant XRT; currently on extended AI (anastrozole) therapy   Essential hypertension    GERD  (gastroesophageal reflux disease)    HLD (hyperlipidemia)    Left thyroid nodule 12/07/2021   a.) CT chest; measured 2.6 cm   Long term current use of immunosuppressive drug    a.) MTX for RA.   OSA on CPAP    currently not using   Osteopenia    Personal history of radiation therapy    Pneumonia    PRES (posterior reversible encephalopathy syndrome) 07/14/2014   Pulmonary nodules 11/08/2021   a.) CXR 11/08/2021: 107mm nodular density RUL. b.) CT chest 12/07/2021: 1.9 x 1.4 x 2.5 spiculated lesion anterior RUL. c.) PET CT 01/06/2022: 2.2 x 1.9 spiculated anterior RUL mass (SUV max 16.4); d.) Bx (+) NSCLC, favoring SCC with tumor necrosis; p40/CK7/GATA3 (+), TTF-1 (-)   Rheumatoid arthritis ([place])    a.) on MTX   Seasonal allergies    Seizures ([place]) 2014   a.) x 1 episode; etiology never determined.   Sinus headache    Squamous cell lung cancer, right ([place]) 03/17/2022   a.) RUL lobectomy 03/17/2022 --> Bx (+) for invasive moderately differentiated SCC of the RIGHT upper lobe (PDL-1: 5%) --> stage IIIA SCC (pT3, pN1, cM0)   ST elevation myocardial infarction (STEMI) of inferior wall ([place]) 10/16/2009   a.) LHC/PCI 10/16/2009: EF 60%; 50% OM1, 99% dRCA (3.0 x 18 mm Xience V DES).   Tubular adenoma    Valvular heart disease  Current Outpatient Medications  Medication Sig Dispense Refill   albuterol (PROVENTIL) (2.5 MG/3ML) 0.083% nebulizer solution Take 3 mLs (2.5 mg total) by nebulization every 6 (six) hours as needed for wheezing or shortness of breath. 125 mL 2   albuterol (VENTOLIN HFA) 108 (90 Base) MCG/ACT inhaler Inhale 1-2 puffs into the lungs every 6 (six) hours as needed for wheezing or shortness of breath. 1 g 3   alendronate (FOSAMAX) 70 MG tablet Take 1 tablet (70 mg total) by mouth once a week. Take with a full glass of water on an empty stomach. Stand or sit upright for 1 hour after taking 12 tablet 1   ALPRAZolam (XANAX) 0.25 MG tablet TAKE 1-2 TABLETS BY MOUTH DAILY AT  BEDTIME AS NEEDED FOR ANXIETY 60 tablet 1   anastrozole (ARIMIDEX) 1 MG tablet Take 1 tablet (1 mg total) by mouth at bedtime. 30 tablet 10   aspirin-acetaminophen-caffeine (EXCEDRIN MIGRAINE) 948-546-27 MG tablet Take by mouth every 6 (six) hours as needed for headache.     fluticasone (FLONASE) 50 MCG/ACT nasal spray Place 2 sprays into both nostrils daily as needed for allergies. 60 mL 1   fluticasone-salmeterol (ADVAIR) 250-50 MCG/ACT AEPB Inhale 1 puff into the lungs in the morning and at bedtime. 60 each 5   folic acid (FOLVITE) 1 MG tablet Take 2 mg by mouth in the morning.     gabapentin (NEURONTIN) 300 MG capsule Take 1 capsule (300 mg total) by mouth 2 (two) times daily. 60 capsule 6   methotrexate (RHEUMATREX) 2.5 MG tablet Take 20 mg by mouth once a week. WEDNESDAY     montelukast (SINGULAIR) 10 MG tablet TAKE (1) TABLET BY MOUTH DAILY AT BEDTIME 90 tablet 1   omeprazole (PRILOSEC) 20 MG capsule Take 20 mg by mouth daily.     oxybutynin (DITROPAN) 5 MG tablet Take 1 tablet (5 mg total) by mouth every 8 (eight) hours as needed for bladder spasms. 30 tablet 0   pantoprazole (PROTONIX) 40 MG tablet Take 1 tablet (40 mg total) by mouth daily. 30 tablet 3   potassium chloride (KLOR-CON) 10 MEQ tablet TAKE (1) TABLET BY MOUTH EVERY DAY 90 tablet 1   rosuvastatin (CRESTOR) 40 MG tablet Take 40 mg by mouth every evening.     sertraline (ZOLOFT) 100 MG tablet TAKE (1) TABLET BY MOUTH EVERY DAY 90 tablet 1   simethicone (MYLICON) 035 MG chewable tablet Chew 125 mg by mouth every 6 (six) hours as needed for flatulence.     traMADol (ULTRAM) 50 MG tablet Take 1-2 tablets (50-100 mg total) by mouth 3 (three) times daily as needed. 60 tablet 0   No current facility-administered medications for this visit.    Physical Exam BP 124/73 (BP Location: Left Arm, Patient Position: Sitting, Cuff Size: Normal)   Pulse 85   Resp 20   Ht 5' (1.524 m)   Wt 130 lb (59 kg)   SpO2 94% Comment: RA  BMI  25.55 kg/m  67 year old woman in no acute distress Alert and oriented x 3 with no focal deficits Lungs clear with essentially equal breath sounds bilaterally Cardiac regular rate and rhythm No cervical supraclavicular adenopathy  Diagnostic Tests: CHEST - 2 VIEW   COMPARISON:  08/09/2022   FINDINGS: The heart size and mediastinal contours are within normal limits. Postsurgical changes from right upper lobectomy. Improved aeration of the right lung with nearly resolved right pleural effusion. No new airspace opacity. Left lung remains clear. No pneumothorax.  IMPRESSION: Improved aeration of the right lung with nearly resolved right pleural effusion.     Electronically Signed   By: Davina Poke D.O.   On: 11/01/2022 09:29   I personally reviewed the chest x-ray images.  No concerning findings.  Impression: Michelle Moore is a 67 year old woman with a history of stage IIIa lung cancer, tobacco abuse, COPD, CAD, MI, risk aortic atherosclerosis, hypertension, hyperlipidemia, reflux, anxiety, depression, bladder cancer, DCIS of breast, and a thyroid nodule.  Stage IIIa squamous cell carcinoma-status post surgical resection followed by adjuvant chemotherapy.  She has follow-up scheduled with Dr. Grayland Ormond in the near future with a CT of the chest.  Postthoracotomy pain-she still has some pain.  She had decreased her gabapentin from twice daily to once a day.  She would like to discontinue that medication altogether.  She is 6 months out now so I think she can try coming off of it.  If her pain worsens she will call me.  Overall well-controlled.  Generalized malaise.  She is been through a lot in the past 6 months with surgery, chemotherapy, bladder cancer procedures, and gastrointestinal issues.  Plan: Discontinue gabapentin. Follow-up as scheduled with Dr. Grayland Ormond I will be happy to see her back anytime in the future if I can be of any further assistance with her care  Melrose Nakayama, MD [facility] [phone]

## 2022-11-07 ENCOUNTER — Encounter: Payer: Self-pay | Admitting: Physician Assistant

## 2022-11-07 ENCOUNTER — Ambulatory Visit (INDEPENDENT_AMBULATORY_CARE_PROVIDER_SITE_OTHER): Payer: Medicare HMO | Admitting: Physician Assistant

## 2022-11-07 VITALS — BP 130/69 | HR 81 | Temp 98.0°F | Resp 16 | Ht 62.0 in | Wt 129.6 lb

## 2022-11-07 DIAGNOSIS — M7918 Myalgia, other site: Secondary | ICD-10-CM | POA: Diagnosis not present

## 2022-11-07 DIAGNOSIS — F411 Generalized anxiety disorder: Secondary | ICD-10-CM

## 2022-11-07 DIAGNOSIS — C3491 Malignant neoplasm of unspecified part of right bronchus or lung: Secondary | ICD-10-CM | POA: Diagnosis not present

## 2022-11-07 MED ORDER — PREDNISONE 10 MG PO TABS: ORAL_TABLET | ORAL | 0 refills | Status: DC

## 2022-11-07 NOTE — Progress Notes (Signed)
[facility] [place], [place] [address]  Internal MEDICINE  Office Visit Note  Patient Name: Michelle Moore  [id number]  [id number]  Date of Service: 11/07/2022  Chief Complaint  Patient presents with   Follow-up   Depression   Gastroesophageal Reflux   Hypertension   Hyperlipidemia   Quality Metric Gaps    TDAP and Shingles Vaccines    HPI Pt is here for routine follow up -Still does not feel well since chemo in Oct -Still has acid reflux since chemo. Upper endoscopy looked ok and hasn't found any answers for why it is worse or what is triggering it -Feels like she pulled something on her right side of neck into shoulder. Aches all along right side of neck and into scapular and states she can feel it in front of shoulder as well. She has been sleeping on this side and thinks it is contributing. Requests steroid taper to help calm down inflammation. She would rather try this over a muscle relaxer given she takes gabapentin and doesn't want something to make her very drowsy. Has done ok with prednisone before -Sinus congestion/drainage, but no coughing. Will try coricidine and flonase. Call if worsening -Goes back to urology in a few weeks -Has a scan tomorrow and oncology follow up next week  Current Medication: Outpatient Encounter Medications as of 11/07/2022  Medication Sig   albuterol (PROVENTIL) (2.5 MG/3ML) 0.083% nebulizer solution Take 3 mLs (2.5 mg total) by nebulization every 6 (six) hours as needed for wheezing or shortness of breath.   albuterol (VENTOLIN HFA) 108 (90 Base) MCG/ACT inhaler Inhale 1-2 puffs into the lungs every 6 (six) hours as needed for wheezing or shortness of breath.   alendronate (FOSAMAX) 70 MG tablet Take 1 tablet (70 mg total) by mouth once a week. Take with a full glass of water on an empty stomach. Stand or sit upright for 1 hour after taking   ALPRAZolam (XANAX) 0.25 MG tablet TAKE 1-2 TABLETS BY MOUTH DAILY AT BEDTIME  AS NEEDED FOR ANXIETY   anastrozole (ARIMIDEX) 1 MG tablet Take 1 tablet (1 mg total) by mouth at bedtime.   aspirin-acetaminophen-caffeine (EXCEDRIN MIGRAINE) 250-250-65 MG tablet Take by mouth every 6 (six) hours as needed for headache.   fluticasone (FLONASE) 50 MCG/ACT nasal spray Place 2 sprays into both nostrils daily as needed for allergies.   fluticasone-salmeterol (ADVAIR) 250-50 MCG/ACT AEPB Inhale 1 puff into the lungs in the morning and at bedtime.   folic acid (FOLVITE) 1 MG tablet Take 2 mg by mouth in the morning.   gabapentin (NEURONTIN) 300 MG capsule Take 1 capsule (300 mg total) by mouth 2 (two) times daily.   methotrexate (RHEUMATREX) 2.5 MG tablet Take 20 mg by mouth once a week. WEDNESDAY   montelukast (SINGULAIR) 10 MG tablet TAKE (1) TABLET BY MOUTH DAILY AT BEDTIME   omeprazole (PRILOSEC) 20 MG capsule Take 20 mg by mouth daily.   oxybutynin (DITROPAN) 5 MG tablet Take 1 tablet (5 mg total) by mouth every 8 (eight) hours as needed for bladder spasms.   pantoprazole (PROTONIX) 40 MG tablet Take 1 tablet (40 mg total) by mouth daily.   potassium chloride (KLOR-CON) 10 MEQ tablet TAKE (1) TABLET BY MOUTH EVERY DAY   predniSONE (DELTASONE) 10 MG tablet Use per dose pack   rosuvastatin (CRESTOR) 40 MG tablet Take 40 mg by mouth every evening.   sertraline (ZOLOFT) 100 MG tablet TAKE (1) TABLET BY MOUTH EVERY DAY  simethicone (MYLICON) 0000000 MG chewable tablet Chew 125 mg by mouth every 6 (six) hours as needed for flatulence.   traMADol (ULTRAM) 50 MG tablet Take 1-2 tablets (50-100 mg total) by mouth 3 (three) times daily as needed.   [DISCONTINUED] prochlorperazine (COMPAZINE) 10 MG tablet Take 1 tablet (10 mg total) by mouth every 6 (six) hours as needed (Nausea or vomiting).   No facility-administered encounter medications on file as of 11/07/2022.    Surgical History: Past Surgical History:  Procedure Laterality Date   ABDOMINAL HYSTERECTOMY     ANTERIOR CERVICAL  DECOMP/DISCECTOMY FUSION N/A 01/20/2014   Procedure: ANTERIOR CERVICAL DECOMPRESSION/DISCECTOMY FUSION 2 LEVELS cervical five/six six [place]ry [place];  Surgeon: Ophelia Charter, MD;  Location: [facility];  Service: Neurosurgery;  Laterality: N/A;   APPENDECTOMY     BACK SURGERY     lumbar   BLADDER INSTILLATION N/A 05/02/2022   Procedure: BLADDER INSTILLATION OF GEMCITABINE;  Surgeon: Hollice Espy, MD;  Location: [facility];  Service: Urology;  Laterality: N/A;   BREAST BIOPSY Right 02/01/2018   x shape, DCIS    BREAST BIOPSY  01/16/2019   coil, BENIGN MAMMARY TISSUE WITH MODERATE STROMAL FIBROSIS AND COARSE DYSTROPHIC CALCIFICATIONS of the RIGHT breast   BREAST LUMPECTOMY Right 03/07/2018   Procedure: BREAST LUMPECTOMY/ RE EXCISION;  Surgeon: Herbert Pun, MD;  Location: [facility];  Service: General;  Laterality: Right;   CARDIAC CATHETERIZATION Left 07/31/2014   Procedure: CARDIAC CATHETERIZATION; Location: [facility]; Surgeon: Serafina Royals, MD   COLONOSCOPY WITH PROPOFOL N/A 02/11/2021   Procedure: COLONOSCOPY WITH PROPOFOL;  Surgeon: Lucilla Lame, MD;  Location: [facility];  Service: Endoscopy;  Laterality: N/A;  Requests Early   CORONARY ANGIOPLASTY WITH STENT PLACEMENT Left 10/16/2009   Procedure: CORONARY ANGIOPLASTY WITH STENT PLACEMENT; Location: [facility]; Surgeon: Katrine Coho, MD   CYSTOSCOPY W/ RETROGRADES Bilateral 05/02/2022   Procedure: CYSTOSCOPY WITH RETROGRADE PYELOGRAM;  Surgeon: Hollice Espy, MD;  Location: [facility];  Service: Urology;  Laterality: Bilateral;   ESOPHAGOGASTRODUODENOSCOPY (EGD) WITH PROPOFOL N/A 10/07/2022   Procedure: ESOPHAGOGASTRODUODENOSCOPY (EGD) WITH PROPOFOL;  Surgeon: Lucilla Lame, MD;  Location: [facility];  Service: Endoscopy;  Laterality: N/A;  sleep apnea   INTERCOSTAL NERVE BLOCK Right 03/17/2022   Procedure: INTERCOSTAL NERVE BLOCK;  Surgeon: Melrose Nakayama, MD;  Location: [place] [facility];  Service: Thoracic;  Laterality: Right;    LASIK Bilateral    LUNG LOBECTOMY Right 03/17/2022   right upper lobe   LYMPH NODE DISSECTION Right 03/17/2022   Procedure: LYMPH NODE DISSECTION;  Surgeon: Melrose Nakayama, MD;  Location: [facility];  Service: Thoracic;  Laterality: Right;   PARTIAL MASTECTOMY WITH NEEDLE LOCALIZATION Right 02/21/2018   Procedure: PARTIAL MASTECTOMY WITH NEEDLE LOCALIZATION;  Surgeon: Herbert Pun, MD;  Location: [facility];  Service: General;  Laterality: Right;   POLYPECTOMY N/A 02/11/2021   Procedure: POLYPECTOMY;  Surgeon: Lucilla Lame, MD;  Location: [facility];  Service: Endoscopy;  Laterality: N/A;   ROTATOR CUFF REPAIR Left    SHOULDER ARTHROSCOPY WITH ROTATOR CUFF REPAIR AND OPEN BICEPS TENODESIS Right 08/07/2019   Procedure: SHOULDER ARTHROSCOPY WITH DEBRIDEMENT, DECOMPRESSION, ROTATOR CUFF REPAIR AND BICEPS TENODESIS. - RNFA;  Surgeon: Corky Mull, MD;  Location: [facility];  Service: Orthopedics;  Laterality: Right;   TOE SURGERY Right    has pin and plate in it   TRANSURETHRAL RESECTION OF BLADDER TUMOR N/A 05/02/2022   Procedure: TRANSURETHRAL RESECTION OF BLADDER TUMOR (TURBT);  Surgeon: Hollice Espy, MD;  Location: [facility];  Service: Urology;  Laterality: N/A;   VIDEO BRONCHOSCOPY WITH ENDOBRONCHIAL ULTRASOUND N/A 03/02/2022   Procedure: VIDEO BRONCHOSCOPY WITH ENDOBRONCHIAL ULTRASOUND;  Surgeon: Tyler Pita, MD;  Location: [facility];  Service: Cardiopulmonary;  Laterality: N/A;    Medical History: Past Medical History:  Diagnosis Date   Adenoma of right adrenal gland 12/07/2021   a.) CT chest 12/07/2021; measured 2.3 cm; not FDG avid on 01/06/2022 PET CT.   Anxiety    a.) uses BZO (alprazolam) PRN   Aortic atherosclerosis (HCC)    Arthritis    Bladder mass 12/07/2021   a.) CT abd 12/07/2021 --> 9 x 8 mm nodular enhancing focus at the base of the bladder; suspicious for urothelial neoplasm.   COPD (chronic obstructive pulmonary disease) (HCC)    Coronary  artery disease 10/16/2009   a.) LHC/PCI 10/16/2009: EF 60%; 50% OM1, 99% dRCA (3.0 x 18 mm Xience V DES). b.) LHC 07/31/2014: EF 65%; 40% pLAD, 20% pLCx, 40% OM1, 30% oRCA, 50% pRCA, 20% mRCA, 10% ISR stent to dRCA; further intervention deferred opting for med. mgmt.   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 b.) s/p lumpectomy, adjuvant XRT; currently on extended AI (anastrozole) therapy   Essential hypertension    GERD (gastroesophageal reflux disease)    HLD (hyperlipidemia)    Left thyroid nodule 12/07/2021   a.) CT chest; measured 2.6 cm   Long term current use of immunosuppressive drug    a.) MTX for RA.   OSA on CPAP    currently not using   Osteopenia    Personal history of radiation therapy    Pneumonia    PRES (posterior reversible encephalopathy syndrome) 07/14/2014   Pulmonary nodules 11/08/2021   a.) CXR 11/08/2021: 73m nodular density RUL. b.) CT chest 12/07/2021: 1.9 x 1.4 x 2.5 spiculated lesion anterior RUL. c.) PET CT 01/06/2022: 2.2 x 1.9 spiculated anterior RUL mass (SUV max 16.4); d.) Bx (+) NSCLC, favoring SCC with tumor necrosis; p40/CK7/GATA3 (+), TTF-1 (-)   Rheumatoid arthritis ([facility] [place]    a.) on MTX   Seasonal allergies    Seizures ([facility]   a.) x 1 episode; etiology never determined.   Sinus headache    Squamous cell lung cancer, right ([facility] 03/17/2022   a.) RUL lobectomy 03/17/2022 --> Bx (+) for invasive moderately differentiated SCC of the RIGHT upper lobe (PDL-1: 5%) --> stage IIIA SCC (pT3, pN1, cM0)   ST elevation myocardial infarction (STEMI) of inferior wall ([facility] 10/16/2009   a.) LHC/PCI 10/16/2009: EF 60%; 50% OM1, 99% dRCA (3.0 x 18 mm Xience V DES).   Tubular adenoma    Valvular heart disease     Family History: Family History  Problem Relation Age of Onset   Breast cancer Cousin 527      bilateral at 527and  741 daughter of maternal aunt who was unaffected   Lung cancer Maternal Aunt        dx 5100s deceased 62s smoker   Heart attack Father        deceased 573  Stomach cancer Maternal Grandmother     Social History   Socioeconomic History   Marital status: Married    Spouse name: JLaverna Peace  Number of children: 1   Years of education: Not on file   Highest education level: Not on file  Occupational History   Not on file  Tobacco Use   Smoking status: Former    Packs/day: 1.00    Years: 30.00    Total pack years: 30.00    Types: Cigarettes    Quit date: 09/13/2015    Years since quitting: 7.1   Smokeless tobacco: Never  Vaping Use   Vaping Use: Never used  Substance and Sexual Activity   Alcohol use: Yes    Comment: occasional   Drug use: No   Sexual activity: Not on file  Other Topics Concern   Not on file  Social History Narrative   Not on file   Social Determinants of Health   Financial Resource Strain: Low Risk  (03/31/2022)   Overall Financial Resource Strain (CARDIA)    Difficulty of Paying Living Expenses: Not very hard  Food Insecurity: No Food Insecurity (05/23/2022)   Hunger Vital Sign    Worried About Running Out of Food in the Last Year: Never true    Ran Out of Food in the Last Year: Never true  Transportation Needs: No Transportation Needs (06/30/2022)   PRAPARE - Hydrologist (Medical): No    Lack of Transportation (Non-Medical): No  Physical Activity: Inactive (03/31/2022)   Exercise Vital Sign    Days of Exercise per Week: 0 days    Minutes of Exercise per Session: 0 min  Stress: No Stress Concern Present (03/31/2022)   [place]    Feeling of Stress : Only a little  Social Connections: Socially Integrated (03/31/2022)   Social Connection and Isolation Panel [NHANES]    Frequency of Communication with Friends and Family: Three times a week    Frequency of  Social Gatherings with Friends and Family: Three times a week    Attends Religious Services: More than 4 times per year    Active Member of Clubs or Organizations: Yes    Attends Archivist Meetings: More than 4 times per year    Marital Status: Married  Human resources officer Violence: Not At Risk (03/31/2022)   Humiliation, Afraid, Rape, and Kick questionnaire    Fear of Current or Ex-Partner: No    Emotionally Abused: No    Physically Abused: No    Sexually Abused: No      Review of Systems  Constitutional:  Negative for chills, fatigue and unexpected weight change.  HENT:  Negative for congestion, rhinorrhea, sneezing and sore throat.   Eyes:  Negative for redness.  Respiratory:  Negative for cough, chest tightness and shortness of breath.   Cardiovascular:  Negative for chest pain and palpitations.  Gastrointestinal:  Negative for abdominal pain, constipation, diarrhea, nausea and vomiting.       Reflux  Genitourinary:  Negative for dysuria and frequency.  Musculoskeletal:  Positive for arthralgias and myalgias. Negative for back pain, joint swelling and neck pain.  Skin:  Negative for rash.  Neurological: Negative.  Negative for tremors and numbness.  Hematological:  Negative for adenopathy. Does not bruise/bleed easily.  Psychiatric/Behavioral:  Positive for behavioral problems (Depression) and sleep disturbance. Negative for self-injury and suicidal ideas. The patient is nervous/anxious.     Vital Signs: BP 130/69   Pulse 81   Temp 98 F (36.7 C)   Resp 16   Ht '5\' 2"'$  (1.575 m)   Wt 129 lb 9.6 oz (58.8 kg)   SpO2 96%   BMI 23.70 kg/m    Physical Exam Vitals  reviewed.  Constitutional:      General: She is not in acute distress.    Appearance: Normal appearance. She is not ill-appearing.  HENT:     Head: Normocephalic and atraumatic.     Right Ear: Tympanic membrane normal.     Left Ear: Tympanic membrane normal.  Eyes:     Pupils: Pupils are equal,  round, and reactive to light.  Cardiovascular:     Rate and Rhythm: Normal rate and regular rhythm.  Pulmonary:     Effort: Pulmonary effort is normal. No respiratory distress.  Abdominal:     General: Abdomen is flat.  Musculoskeletal:        General: Tenderness present. Normal range of motion.     Cervical back: Normal range of motion.     Comments: Right side of neck and upper back tenderness  Skin:    General: Skin is warm and dry.  Neurological:     Mental Status: She is alert and oriented to person, place, and time.  Psychiatric:        Mood and Affect: Mood normal.        Behavior: Behavior normal.        Assessment/Plan: 1. Myalgia, multiple sites Will start short prednisone taper - predniSONE (DELTASONE) 10 MG tablet; Use per dose pack  Dispense: 21 tablet; Refill: 0  2. Generalized anxiety disorder May continue current medications and may call when due for refill as she still has some remaining  3. Squamous cell carcinoma of right lung ([place]) Followed by oncology   General Counseling: Valentina Gu understanding of the findings of todays visit and agrees with plan of treatment. I have discussed any further diagnostic evaluation that may be needed or ordered today. We also reviewed her medications today. she has been encouraged to call the office with any questions or concerns that should arise related to todays visit.    No orders of the defined types were placed in this encounter.   Meds ordered this encounter  Medications   predniSONE (DELTASONE) 10 MG tablet    Sig: Use per dose pack    Dispense:  21 tablet    Refill:  0    This patient was seen by Drema Dallas, PA-C in collaboration with Dr. Clayborn Bigness as a part of collaborative care agreement.   Total time spent:30 Minutes Time spent includes review of chart, medications, test results, and follow up plan with the patient.      Dr Lavera Guise Internal medicine

## 2022-11-08 ENCOUNTER — Ambulatory Visit
Admission: RE | Admit: 2022-11-08 | Discharge: 2022-11-08 | Disposition: A | Discharge status: Home / Self Care | Payer: Medicare HMO | Source: Ambulatory Visit | Attending: Oncology | Admitting: Oncology

## 2022-11-08 DIAGNOSIS — S2241XA Multiple fractures of ribs, right side, initial encounter for closed fracture: Secondary | ICD-10-CM | POA: Diagnosis not present

## 2022-11-08 DIAGNOSIS — C3491 Malignant neoplasm of unspecified part of right bronchus or lung: Secondary | ICD-10-CM | POA: Insufficient documentation

## 2022-11-08 DIAGNOSIS — C3490 Malignant neoplasm of unspecified part of unspecified bronchus or lung: Secondary | ICD-10-CM | POA: Diagnosis not present

## 2022-11-08 LAB — POCT I-STAT CREATININE
Creatinine, Ser: 0.6 mg/dL (ref 0.44–1.00)
Creatinine, Ser: 0.9 mg/dL (ref 0.44–1.00)

## 2022-11-08 MED ORDER — IOHEXOL 300 MG/ML  SOLN
75.0000 mL | Freq: Once | INTRAMUSCULAR | Status: AC | PRN
  Administered 2022-11-08: 75 mL via INTRAVENOUS

## 2022-11-09 ENCOUNTER — Ambulatory Visit: Payer: Medicare HMO | Admitting: Pulmonary Disease

## 2022-11-10 ENCOUNTER — Other Ambulatory Visit: Payer: Medicare HMO | Admitting: Urology

## 2022-11-16 ENCOUNTER — Inpatient Hospital Stay (HOSPITAL_BASED_OUTPATIENT_CLINIC_OR_DEPARTMENT_OTHER): Payer: Medicare HMO | Admitting: Oncology

## 2022-11-16 ENCOUNTER — Ambulatory Visit: Payer: Medicare HMO | Admitting: Urology

## 2022-11-16 ENCOUNTER — Encounter: Payer: Self-pay | Admitting: Oncology

## 2022-11-16 ENCOUNTER — Other Ambulatory Visit: Payer: Medicare HMO | Admitting: Urology

## 2022-11-16 ENCOUNTER — Encounter: Payer: Self-pay | Admitting: Urology

## 2022-11-16 ENCOUNTER — Inpatient Hospital Stay: Payer: Medicare HMO | Attending: Oncology

## 2022-11-16 VITALS — BP 114/76 | HR 90 | Ht 62.0 in | Wt 133.0 lb

## 2022-11-16 VITALS — BP 104/71 | HR 80 | Temp 98.2°F | Resp 16 | Ht 62.0 in | Wt 133.0 lb

## 2022-11-16 DIAGNOSIS — D0511 Intraductal carcinoma in situ of right breast: Secondary | ICD-10-CM | POA: Insufficient documentation

## 2022-11-16 DIAGNOSIS — C3491 Malignant neoplasm of unspecified part of right bronchus or lung: Secondary | ICD-10-CM

## 2022-11-16 DIAGNOSIS — Z803 Family history of malignant neoplasm of breast: Secondary | ICD-10-CM | POA: Diagnosis not present

## 2022-11-16 DIAGNOSIS — N3289 Other specified disorders of bladder: Secondary | ICD-10-CM | POA: Diagnosis not present

## 2022-11-16 DIAGNOSIS — R8290 Unspecified abnormal findings in urine: Secondary | ICD-10-CM | POA: Diagnosis not present

## 2022-11-16 DIAGNOSIS — Z801 Family history of malignant neoplasm of trachea, bronchus and lung: Secondary | ICD-10-CM | POA: Diagnosis not present

## 2022-11-16 DIAGNOSIS — M8580 Other specified disorders of bone density and structure, unspecified site: Secondary | ICD-10-CM | POA: Insufficient documentation

## 2022-11-16 DIAGNOSIS — Z79811 Long term (current) use of aromatase inhibitors: Secondary | ICD-10-CM | POA: Insufficient documentation

## 2022-11-16 DIAGNOSIS — Z800 Family history of malignant neoplasm of digestive organs: Secondary | ICD-10-CM | POA: Insufficient documentation

## 2022-11-16 DIAGNOSIS — K219 Gastro-esophageal reflux disease without esophagitis: Secondary | ICD-10-CM | POA: Insufficient documentation

## 2022-11-16 DIAGNOSIS — R92313 Mammographic fatty tissue density, bilateral breasts: Secondary | ICD-10-CM | POA: Diagnosis not present

## 2022-11-16 DIAGNOSIS — Z79899 Other long term (current) drug therapy: Secondary | ICD-10-CM | POA: Diagnosis not present

## 2022-11-16 DIAGNOSIS — D494 Neoplasm of unspecified behavior of bladder: Secondary | ICD-10-CM

## 2022-11-16 DIAGNOSIS — Z87891 Personal history of nicotine dependence: Secondary | ICD-10-CM | POA: Diagnosis not present

## 2022-11-16 DIAGNOSIS — G629 Polyneuropathy, unspecified: Secondary | ICD-10-CM | POA: Insufficient documentation

## 2022-11-16 DIAGNOSIS — Z902 Acquired absence of lung [part of]: Secondary | ICD-10-CM | POA: Insufficient documentation

## 2022-11-16 DIAGNOSIS — D649 Anemia, unspecified: Secondary | ICD-10-CM | POA: Insufficient documentation

## 2022-11-16 DIAGNOSIS — Z85118 Personal history of other malignant neoplasm of bronchus and lung: Secondary | ICD-10-CM | POA: Diagnosis not present

## 2022-11-16 DIAGNOSIS — Z923 Personal history of irradiation: Secondary | ICD-10-CM | POA: Insufficient documentation

## 2022-11-16 DIAGNOSIS — Z8551 Personal history of malignant neoplasm of bladder: Secondary | ICD-10-CM | POA: Diagnosis not present

## 2022-11-16 DIAGNOSIS — Z9221 Personal history of antineoplastic chemotherapy: Secondary | ICD-10-CM | POA: Diagnosis not present

## 2022-11-16 LAB — URINALYSIS, COMPLETE
Bilirubin, UA: NEGATIVE
Glucose, UA: NEGATIVE
Ketones, UA: NEGATIVE
Nitrite, UA: NEGATIVE
Specific Gravity, UA: 1.02 (ref 1.005–1.030)
Urobilinogen, Ur: 0.2 mg/dL (ref 0.2–1.0)
pH, UA: 5.5 (ref 5.0–7.5)

## 2022-11-16 LAB — COMPREHENSIVE METABOLIC PANEL
ALT: 14 U/L (ref 0–44)
AST: 16 U/L (ref 15–41)
Albumin: 3.7 g/dL (ref 3.5–5.0)
Alkaline Phosphatase: 69 U/L (ref 38–126)
Anion gap: 9 (ref 5–15)
BUN: 17 mg/dL (ref 8–23)
CO2: 26 mmol/L (ref 22–32)
Calcium: 8.6 mg/dL — ABNORMAL LOW (ref 8.9–10.3)
Chloride: 104 mmol/L (ref 98–111)
Creatinine, Ser: 0.89 mg/dL (ref 0.44–1.00)
GFR, Estimated: >60 mL/min (ref >60)
Glucose, Bld: 102 mg/dL — ABNORMAL HIGH (ref 70–99)
Potassium: 4.9 mmol/L (ref 3.5–5.1)
Sodium: 139 mmol/L (ref 135–145)
Total Bilirubin: 0.3 mg/dL (ref 0.3–1.2)
Total Protein: 6.9 g/dL (ref 6.5–8.1)

## 2022-11-16 LAB — CBC WITH DIFFERENTIAL/PLATELET
Abs Immature Granulocytes: 0.07 10*3/uL (ref 0.00–0.07)
Basophils Absolute: 0 10*3/uL (ref 0.0–0.1)
Basophils Relative: 0 %
Eosinophils Absolute: 0.1 10*3/uL (ref 0.0–0.5)
Eosinophils Relative: 1 %
HCT: 35.5 % — ABNORMAL LOW (ref 36.0–46.0)
Hemoglobin: 10.9 g/dL — ABNORMAL LOW (ref 12.0–15.0)
Immature Granulocytes: 1 %
Lymphocytes Relative: 18 %
Lymphs Abs: 1.7 10*3/uL (ref 0.7–4.0)
MCH: 30 pg (ref 26.0–34.0)
MCHC: 30.7 g/dL (ref 30.0–36.0)
MCV: 97.8 fL (ref 80.0–100.0)
Monocytes Absolute: 1 10*3/uL (ref 0.1–1.0)
Monocytes Relative: 10 %
Neutro Abs: 6.7 10*3/uL (ref 1.7–7.7)
Neutrophils Relative %: 70 %
Platelets: 165 10*3/uL (ref 150–400)
RBC: 3.63 MIL/uL — ABNORMAL LOW (ref 3.87–5.11)
RDW: 16.9 % — ABNORMAL HIGH (ref 11.5–15.5)
WBC: 9.7 10*3/uL (ref 4.0–10.5)
nRBC: 0 % (ref 0.0–0.2)

## 2022-11-16 LAB — MICROSCOPIC EXAMINATION

## 2022-11-16 NOTE — Progress Notes (Signed)
   11/16/22  CC:  Chief Complaint  Patient presents with   Cysto    HPI: 67 year old female with personal history of presumed bladder cancer presents today for surveillance cystoscopy.  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  TURBT was delayed for several issues including for lobectomy and initiation of chemo for new diagnosis of lung cancer.   Ultimately, she underwent TURBT on 05/02/2022.  Intraoperatively, the lesion was much smaller than the patient in the office and had almost an atrophic-like appearance.  She also received postoperative gemcitabine.   Surgical pathology was consistent mildly thickened surface urothelial mucosa with no dysplasia was appreciated.  Presumably involution of the tumor was related to Taxotere and cisplatin which she is receiving for lung cancer.  Cystoscopy was deferred few weeks due to concern for possible infection in light of urinalysis.  Ultimately, the urine culture was negative and she was minimally symptomatic.  As such, she was rescheduled for today.    Blood pressure 101/66, pulse 93, height '5\' 2"'$  (1.575 m), weight 137 lb (62.1 kg). NED. A&Ox3.   No respiratory distress   Abd soft, NT, ND Normal external genitalia with patent urethral meatus  Cystoscopy Procedure Note  Patient identification was confirmed, informed consent was obtained, and patient was prepped using Betadine solution.  Lidocaine jelly was administered per urethral meatus.    Procedure: - Flexible cystoscope introduced, without any difficulty.   - Thorough search of the bladder revealed:    normal urethral meatus    normal urothelium with  persistent white flat area of necrosis at the right bladder neck    no stones    no ulcers     no tumors    no urethral polyps    no trabeculation  - Ureteral orifices were normal in position and appearance.  Post-Procedure: - Patient tolerated the procedure well  Assessment/ Plan:  1. Bladder mass Persistent necrosis at the level of the bladder neck on the right  Will continue to monitor this somewhat unusual that is yet to remucosalized although there is no viable tumor at this location  Plan for cystoscopy again in 3 months, she is agreeable with this plan. - Urinalysis, Complete   Hollice Espy, MD

## 2022-11-16 NOTE — Progress Notes (Signed)
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  INTERVAL HISTORY: Patient returns to clinic today for repeat laboratory work, further evaluation, and discussion of her imaging results.  She continues to have a mild peripheral neuropathy, but otherwise feels well.  She has no other neurologic complaints.  She continues to tolerate anastrozole without significant side effects. She denies any recent fevers or illnesses. She has a good appetite and denies weight loss.  She denies any chest pain, shortness of breath, cough, or hemoptysis.  She denies any nausea, vomiting, constipation, or diarrhea.  Patient offers no further specific complaints today.  REVIEW OF SYSTEMS:   Review of Systems  Constitutional: Negative.  Negative for fever, malaise/fatigue and weight loss.  Respiratory: Negative.  Negative for cough, hemoptysis and shortness of breath.   Cardiovascular: Negative.  Negative for chest pain and leg swelling.  Gastrointestinal: Negative.  Negative for abdominal pain and heartburn.  Genitourinary:  Negative for dysuria and frequency.  Musculoskeletal: Negative.  Negative for back pain and myalgias.  Skin: Negative.  Negative for rash.  Neurological:  Positive for sensory change. Negative for dizziness, focal weakness and weakness.  Psychiatric/Behavioral: Negative.  The patient is not nervous/anxious.     As per HPI. Otherwise, a complete review of systems is negative.  PAST MEDICAL HISTORY: Past Medical  History:  Diagnosis Date   Adenoma of right adrenal gland 12/07/2021   a.) CT chest 12/07/2021; measured 2.3 cm; not FDG avid on 01/06/2022 PET CT.   Anxiety    a.) uses BZO (alprazolam) PRN   Aortic atherosclerosis (HCC)    Arthritis    Bladder mass 12/07/2021   a.) CT abd 12/07/2021 --> 9 x 8 mm nodular enhancing focus at the base of the bladder; suspicious for urothelial neoplasm.   COPD (chronic obstructive pulmonary disease) (HCC)    Coronary artery disease 10/16/2009   a.) LHC/PCI 10/16/2009: EF 60%; 50% OM1, 99% dRCA (3.0 x 18 mm Xience V DES). b.) LHC 07/31/2014: EF 65%; 40% pLAD, 20% pLCx, 40% OM1, 30% oRCA, 50% pRCA, 20% mRCA, 10% ISR stent to dRCA; further intervention deferred opting for med. mgmt.   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 b.) s/p lumpectomy, adjuvant XRT; currently on extended AI (anastrozole) therapy   Essential hypertension    GERD (gastroesophageal reflux disease)    HLD (hyperlipidemia)    Left thyroid nodule 12/07/2021   a.) CT chest; measured 2.6 cm   Long term current use of immunosuppressive drug    a.) MTX for RA.   OSA on CPAP    currently not using   Osteopenia    Personal history of radiation therapy    Pneumonia    PRES (posterior reversible encephalopathy syndrome) 07/14/2014   Pulmonary nodules 11/08/2021   a.) CXR 11/08/2021: 40m nodular density RUL. b.) CT chest 12/07/2021: 1.9 x 1.4 x 2.5 spiculated lesion anterior RUL. c.) PET CT 01/06/2022:  2.2 x 1.9 spiculated anterior RUL mass (SUV max 16.4); d.) Bx (+) NSCLC, favoring SCC with tumor necrosis; p40/CK7/GATA3 (+), TTF-1 (-)   Rheumatoid arthritis ([place])    a.) on MTX   Seasonal allergies    Seizures ([place]) 2014   a.) x 1 episode; etiology never determined.   Sinus headache    Squamous cell lung cancer, right ([place]) 03/17/2022   a.) RUL lobectomy  03/17/2022 --> Bx (+) for invasive moderately differentiated SCC of the RIGHT upper lobe (PDL-1: 5%) --> stage IIIA SCC (pT3, pN1, cM0)   ST elevation myocardial infarction (STEMI) of inferior wall ([place]) 10/16/2009   a.) LHC/PCI 10/16/2009: EF 60%; 50% OM1, 99% dRCA (3.0 x 18 mm Xience V DES).   Tubular adenoma    Valvular heart disease     PAST SURGICAL HISTORY: Past Surgical History:  Procedure Laterality Date   ABDOMINAL HYSTERECTOMY     ANTERIOR CERVICAL DECOMP/DISCECTOMY FUSION N/A 01/20/2014   Procedure: ANTERIOR CERVICAL DECOMPRESSION/DISCECTOMY FUSION 2 LEVELS cervical five/six six [place];  Surgeon: Ophelia Charter, MD;  Location: [facility];  Service: Neurosurgery;  Laterality: N/A;   APPENDECTOMY     BACK SURGERY     lumbar   BLADDER INSTILLATION N/A 05/02/2022   Procedure: BLADDER INSTILLATION OF GEMCITABINE;  Surgeon: Hollice Espy, MD;  Location: [facility];  Service: Urology;  Laterality: N/A;   BREAST BIOPSY Right 02/01/2018   x shape, DCIS    BREAST BIOPSY  01/16/2019   coil, BENIGN MAMMARY TISSUE WITH MODERATE STROMAL FIBROSIS AND COARSE DYSTROPHIC CALCIFICATIONS of the RIGHT breast   BREAST LUMPECTOMY Right 03/07/2018   Procedure: BREAST LUMPECTOMY/ RE EXCISION;  Surgeon: Herbert Pun, MD;  Location: [facility];  Service: General;  Laterality: Right;   CARDIAC CATHETERIZATION Left 07/31/2014   Procedure: CARDIAC CATHETERIZATION; Location: [facility]; Surgeon: Serafina Royals, MD   COLONOSCOPY WITH PROPOFOL N/A 02/11/2021   Procedure: COLONOSCOPY WITH PROPOFOL;  Surgeon: Lucilla Lame, MD;  Location: [facility];  Service: Endoscopy;  Laterality: N/A;  Requests Early   CORONARY ANGIOPLASTY WITH STENT PLACEMENT Left 10/16/2009   Procedure: CORONARY ANGIOPLASTY WITH STENT PLACEMENT; Location: [place] [facility]; Surgeon: Katrine Coho, MD   CYSTOSCOPY W/ RETROGRADES Bilateral 05/02/2022   Procedure: CYSTOSCOPY WITH RETROGRADE PYELOGRAM;  Surgeon: Hollice Espy, MD;   Location: [facility];  Service: Urology;  Laterality: Bilateral;   ESOPHAGOGASTRODUODENOSCOPY (EGD) WITH PROPOFOL N/A 10/07/2022   Procedure: ESOPHAGOGASTRODUODENOSCOPY (EGD) WITH PROPOFOL;  Surgeon: Lucilla Lame, MD;  Location: [facility];  Service: Endoscopy;  Laterality: N/A;  sleep apnea   INTERCOSTAL NERVE BLOCK Right 03/17/2022   Procedure: INTERCOSTAL NERVE BLOCK;  Surgeon: Melrose Nakayama, MD;  Location: [place];  Service: Thoracic;  Laterality: Right;   LASIK Bilateral    LUNG LOBECTOMY Right 03/17/2022   right upper lobe   LYMPH NODE DISSECTION Right 03/17/2022   Procedure: LYMPH NODE DISSECTION;  Surgeon: Melrose Nakayama, MD;  Location: [place];  Service: Thoracic;  Laterality: Right;   PARTIAL MASTECTOMY WITH NEEDLE LOCALIZATION Right 02/21/2018   Procedure: PARTIAL MASTECTOMY WITH NEEDLE LOCALIZATION;  Surgeon: Herbert Pun, MD;  Location: [facility];  Service: General;  Laterality: Right;   POLYPECTOMY N/A 02/11/2021   Procedure: POLYPECTOMY;  Surgeon: Lucilla Lame, MD;  Location: [facility];  Service: Endoscopy;  Laterality: N/A;   ROTATOR CUFF REPAIR Left    SHOULDER ARTHROSCOPY WITH ROTATOR CUFF REPAIR AND OPEN BICEPS TENODESIS Right 08/07/2019   Procedure: SHOULDER ARTHROSCOPY WITH DEBRIDEMENT, DECOMPRESSION, ROTATOR CUFF REPAIR AND  BICEPS TENODESIS. - RNFA;  Surgeon: Corky Mull, MD;  Location: [facility];  Service: Orthopedics;  Laterality: Right;   TOE SURGERY Right    has pin and plate in it   TRANSURETHRAL RESECTION OF BLADDER TUMOR N/A 05/02/2022   Procedure: TRANSURETHRAL RESECTION OF BLADDER TUMOR (TURBT);  Surgeon: Hollice Espy, MD;  Location: [facility];  Service: Urology;  Laterality: N/A;   VIDEO BRONCHOSCOPY WITH ENDOBRONCHIAL ULTRASOUND N/A 03/02/2022   Procedure: VIDEO BRONCHOSCOPY WITH ENDOBRONCHIAL ULTRASOUND;  Surgeon: Tyler Pita, MD;  Location: [facility];  Service: Cardiopulmonary;  Laterality: N/A;    FAMILY  HISTORY: Family History  Problem Relation Age of Onset   Breast cancer Cousin 60       bilateral at 58 and 65; daughter of maternal aunt who was unaffected   Lung cancer Maternal Aunt        dx 83s; deceased 52s; smoker   Heart attack Father        deceased 32   Stomach cancer Maternal Grandmother     ADVANCED DIRECTIVES (Y/N):  N  HEALTH MAINTENANCE: Social History   Tobacco Use   Smoking status: Former    Packs/day: 1.00    Years: 30.00    Total pack years: 30.00    Types: Cigarettes    Quit date: 09/13/2015    Years since quitting: 7.1   Smokeless tobacco: Never  Vaping Use   Vaping Use: Never used  Substance Use Topics   Alcohol use: Yes    Comment: occasional   Drug use: No     Colonoscopy:  PAP:  Bone density:  Lipid panel:  Allergies  Allergen Reactions   Other     BANDAIDS-OF LEFT ON FOR AN EXTENDED PERIOD OF TIME  Patient states latex rubber gloves are ok    Current Outpatient Medications  Medication Sig Dispense Refill   albuterol (VENTOLIN HFA) 108 (90 Base) MCG/ACT inhaler Inhale 1-2 puffs into the lungs every 6 (six) hours as needed for wheezing or shortness of breath. 1 g 3   alendronate (FOSAMAX) 70 MG tablet Take 1 tablet (70 mg total) by mouth once a week. Take with a full glass of water on an empty stomach. Stand or sit upright for 1 hour after taking 12 tablet 1   ALPRAZolam (XANAX) 0.25 MG tablet TAKE 1-2 TABLETS BY MOUTH DAILY AT BEDTIME AS NEEDED FOR ANXIETY 60 tablet 1   anastrozole (ARIMIDEX) 1 MG tablet Take 1 tablet (1 mg total) by mouth at bedtime. 30 tablet 10   aspirin-acetaminophen-caffeine (EXCEDRIN MIGRAINE) [id number] MG tablet Take by mouth every 6 (six) hours as needed for headache.     fluticasone (FLONASE) 50 MCG/ACT nasal spray Place 2 sprays into both nostrils daily as needed for allergies. 60 mL 1   fluticasone-salmeterol (ADVAIR) 250-50 MCG/ACT AEPB Inhale 1 puff into the lungs in the morning and at bedtime. 60 each 5    folic acid (FOLVITE) 1 MG tablet Take 2 mg by mouth in the morning.     gabapentin (NEURONTIN) 300 MG capsule Take 1 capsule (300 mg total) by mouth 2 (two) times daily. 60 capsule 6   methotrexate (RHEUMATREX) 2.5 MG tablet Take 20 mg by mouth once a week. WEDNESDAY     montelukast (SINGULAIR) 10 MG tablet TAKE (1) TABLET BY MOUTH DAILY AT BEDTIME 90 tablet 1   omeprazole (PRILOSEC) 20 MG capsule Take 20 mg by mouth daily.     oxybutynin (DITROPAN) 5 MG tablet Take 1  tablet (5 mg total) by mouth every 8 (eight) hours as needed for bladder spasms. 30 tablet 0   pantoprazole (PROTONIX) 40 MG tablet Take 1 tablet (40 mg total) by mouth daily. 30 tablet 3   potassium chloride (KLOR-CON) 10 MEQ tablet TAKE (1) TABLET BY MOUTH EVERY DAY 90 tablet 1   rosuvastatin (CRESTOR) 40 MG tablet Take 40 mg by mouth every evening.     sertraline (ZOLOFT) 100 MG tablet TAKE (1) TABLET BY MOUTH EVERY DAY 90 tablet 1   traMADol (ULTRAM) 50 MG tablet Take 1-2 tablets (50-100 mg total) by mouth 3 (three) times daily as needed. 60 tablet 0   albuterol (PROVENTIL) (2.5 MG/3ML) 0.083% nebulizer solution Take 3 mLs (2.5 mg total) by nebulization every 6 (six) hours as needed for wheezing or shortness of breath. (Patient not taking: Reported on 11/16/2022) 125 mL 2   predniSONE (DELTASONE) 10 MG tablet Use per dose pack (Patient not taking: Reported on 11/16/2022) 21 tablet 0   simethicone (MYLICON) 0000000 MG chewable tablet Chew 125 mg by mouth every 6 (six) hours as needed for flatulence. (Patient not taking: Reported on 11/16/2022)     No current facility-administered medications for this visit.    OBJECTIVE: Vitals:   11/16/22 1032  BP: 104/71  Pulse: 80  Resp: 16  Temp: 98.2 F (36.8 C)  SpO2: 99%     Body mass index is 24.33 kg/m.    ECOG FS:0 - Asymptomatic  General: Well-developed, well-nourished, no acute distress. Eyes: Pink conjunctiva, anicteric sclera. HEENT: Normocephalic, moist mucous membranes. Lungs:  No audible wheezing or coughing. Heart: Regular rate and rhythm. Abdomen: Soft, nontender, no obvious distention. Musculoskeletal: No edema, cyanosis, or clubbing. Neuro: Alert, answering all questions appropriately. Cranial nerves grossly intact. Skin: No rashes or petechiae noted. Psych: Normal affect.  LAB RESULTS:  Lab Results  Component Value Date   NA 139 11/16/2022   K 4.9 11/16/2022   CL 104 11/16/2022   CO2 26 11/16/2022   GLUCOSE 102 (H) 11/16/2022   BUN 17 11/16/2022   CREATININE 0.89 11/16/2022   CALCIUM 8.6 (L) 11/16/2022   PROT 6.9 11/16/2022   ALBUMIN 3.7 11/16/2022   AST 16 11/16/2022   ALT 14 11/16/2022   ALKPHOS 69 11/16/2022   BILITOT 0.3 11/16/2022   GFRNONAA >60 11/16/2022   GFRAA >60 04/09/2020    Lab Results  Component Value Date   WBC 9.7 11/16/2022   NEUTROABS 6.7 11/16/2022   HGB 10.9 (L) 11/16/2022   HCT 35.5 (L) 11/16/2022   MCV 97.8 11/16/2022   PLT 165 11/16/2022     STUDIES: CT Chest W Contrast  Result Date: 11/09/2022 CLINICAL DATA:  Non-small-cell lung cancer. Restaging. * Tracking Code: BO * EXAM: CT CHEST WITH CONTRAST TECHNIQUE: Multidetector CT imaging of the chest was performed during intravenous contrast administration. RADIATION DOSE REDUCTION: This exam was performed according to the departmental dose-optimization program which includes automated exposure control, adjustment of the mA and/or kV according to patient size and/or use of iterative reconstruction technique. CONTRAST:  26m OMNIPAQUE IOHEXOL 300 MG/ML  SOLN COMPARISON:  08/09/2022 FINDINGS: Cardiovascular: The heart size is normal. No substantial pericardial effusion. Coronary artery calcification is evident. Mild atherosclerotic calcification is noted in the wall of the thoracic aorta. Mediastinum/Nodes: No mediastinal lymphadenopathy. There is no hilar lymphadenopathy. The esophagus has normal imaging features. There is no axillary lymphadenopathy. Lungs/Pleura: Status  post right upper lobectomy. Stable loculated anterior right sided pleural fluid collection. Pleuroparenchymal scarring anterior  right lung is stable no new suspicious pulmonary nodule or mass. No focal airspace consolidation. No pleural effusion. Upper Abdomen: 2.1 cm right adrenal nodule is stable compatible with adenoma. No followup imaging is recommended. Cortical scarring noted upper pole left kidney Musculoskeletal: No worrisome lytic or sclerotic osseous abnormality. Multiple nonacute right rib fractures evident. Similar appearance of a fluid collection in the lateral right breast, potentially chronic hematoma or seroma IMPRESSION: 1. Stable exam. No new or progressive findings to suggest recurrent or metastatic disease. 2. Status post right upper lobectomy with stable loculated anterior right sided pleural fluid collection. 3. Similar appearance of a fluid collection in the lateral right breast, potentially chronic hematoma or seroma. 4.  Aortic Atherosclerosis (ICD10-I70.0). Electronically Signed   By: Misty Stanley M.D.   On: 11/09/2022 10:19   DG Chest 2 View  Result Date: 11/01/2022 CLINICAL DATA:  Lung cancer status post right upper lobectomy EXAM: CHEST - 2 VIEW COMPARISON:  08/09/2022 FINDINGS: The heart size and mediastinal contours are within normal limits. Postsurgical changes from right upper lobectomy. Improved aeration of the right lung with nearly resolved right pleural effusion. No new airspace opacity. Left lung remains clear. No pneumothorax. IMPRESSION: Improved aeration of the right lung with nearly resolved right pleural effusion. Electronically Signed   By: Davina Poke D.O.   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  PLAN:    Stage IIIa squamous cell carcinoma of the right upper lung: Final pathology results from her right upper lobectomy on March 17, 2022 revealed a 4.1 cm lesion  that was adherent to the chest wall, but not invading.  She was also noted to have 1 of 33 lymph nodes positive for disease.  This increased her to a stage IIIa. MRI of the brain on April 21, 2022 reviewed independently with no obvious evidence of metastatic disease. Patient completed 4 cycles of adjuvant chemotherapy using cisplatin and Taxotere on June 22, 2022.  Her most recent imaging on November 08, 2022 reviewed independently and reported as above with no obvious evidence of recurrent or progressive disease.  No further intervention is needed at this time.  Return to clinic in 6 months with repeat imaging and further evaluation. DCIS, right breast: Patient underwent lumpectomy followed by adjuvant XRT completing in September 2019.  Because there was no invasive component on her pathology, she did not require adjuvant chemotherapy. Patient could not tolerate tamoxifen or letrozole secondary to worsening joint pain, therefore was switched to anastrozole.  She is tolerating this well and will complete 5 years of treatment in September 2024.  Patient has been instructed to continue treatment until next clinic appointment.  Her most recent mammogram on March 11, 2022 was reported BI-RADS 1.  Repeat mammogram in June 2024.   Osteopenia: Patient's most recent bone mineral density on March 25, 2021 revealed a T score of -2.3 which is slightly worse than previous where her T score was reported -1.9 and -1.4 and years prior.  Continue calcium, vitamin D, and alendronate.   Repeat bone mineral density in June 2024 along with mammogram as above. Reflux/abdominal pain: Patient does not complain of this today.  Continue omeprazole.   Anemia: Patient's hemoglobin continues to slowly trend up and is now 10.9.     Lloyd Huger, MD 11/16/2022 12:26 PM   Cancer Staging  Ductal carcinoma in situ (DCIS) of right breast  Staging form: Breast, AJCC 8th Edition - Clinical: Stage 0 (cTis (DCIS), cN0, cM0, ER+, PR-,  HER2-) - Signed by Lloyd Huger, MD on 03/18/2018 Nuclear grade: G3 Laterality: Right  Squamous cell carcinoma of right lung ([place]) Staging form: Lung, AJCC 8th Edition - Pathologic stage from 04/07/2022: Stage IIIA (pT3, pN1, cM0) - Signed by Lloyd Huger, MD on 04/07/2022

## 2022-11-16 NOTE — Progress Notes (Signed)
Survivorship Care Plan visit completed.  Treatment summary reviewed and given to patient.  ASCO answers booklet reviewed and given to patient.  CARE program and Cancer Transitions discussed with patient along with other resources cancer center offers to patients and caregivers.  Patient verbalized understanding.    

## 2022-11-16 NOTE — Progress Notes (Signed)
States that she has been done with chemo since October but still does not feel well. Has feelings of knots under her feet and also has some numbness as well. Taking 300 of gabapentin nightly

## 2022-11-17 DIAGNOSIS — Z79899 Other long term (current) drug therapy: Secondary | ICD-10-CM | POA: Diagnosis not present

## 2022-11-17 DIAGNOSIS — M0579 Rheumatoid arthritis with rheumatoid factor of multiple sites without organ or systems involvement: Secondary | ICD-10-CM | POA: Diagnosis not present

## 2022-11-17 DIAGNOSIS — M65341 Trigger finger, right ring finger: Secondary | ICD-10-CM | POA: Diagnosis not present

## 2022-11-23 ENCOUNTER — Encounter: Payer: Self-pay | Admitting: Oncology

## 2022-12-29 ENCOUNTER — Encounter: Payer: Self-pay | Admitting: Physician Assistant

## 2022-12-29 ENCOUNTER — Telehealth: Payer: Self-pay | Admitting: Physician Assistant

## 2022-12-29 ENCOUNTER — Ambulatory Visit (INDEPENDENT_AMBULATORY_CARE_PROVIDER_SITE_OTHER): Payer: Medicare HMO | Admitting: Physician Assistant

## 2022-12-29 VITALS — BP 128/70 | HR 75 | Temp 97.8°F | Resp 16 | Ht 62.0 in | Wt 134.0 lb

## 2022-12-29 DIAGNOSIS — M542 Cervicalgia: Secondary | ICD-10-CM

## 2022-12-29 DIAGNOSIS — R1310 Dysphagia, unspecified: Secondary | ICD-10-CM

## 2022-12-29 DIAGNOSIS — J0110 Acute frontal sinusitis, unspecified: Secondary | ICD-10-CM | POA: Diagnosis not present

## 2022-12-29 MED ORDER — FLUCONAZOLE 150 MG PO TABS: 150.0000 mg | ORAL_TABLET | Freq: Once | ORAL | 0 refills | Status: AC

## 2022-12-29 MED ORDER — AMOXICILLIN-POT CLAVULANATE 875-125 MG PO TABS: 1.0000 | ORAL_TABLET | Freq: Two times a day (BID) | ORAL | 0 refills | Status: DC

## 2022-12-29 MED ORDER — CYCLOBENZAPRINE HCL 10 MG PO TABS
10.0000 mg | ORAL_TABLET | Freq: Every day | ORAL | 1 refills | Status: DC
Start: 2022-12-29 — End: 2023-02-15

## 2022-12-29 NOTE — Progress Notes (Signed)
[facility] [address] [place], [place] [address]  Internal MEDICINE  Office Visit Note  Patient Name: Michelle Moore  [id number]  [id number]  Date of Service: 12/29/2022  Chief Complaint  Patient presents with   Acute Visit    Difficulty swallowing    Neck Pain    Right side     HPI Pt is here for a sick visit. -Right side neck and shoulder pain still. Was treated with course of prednisone previously and helped briefly then returned. 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 Coughing and finds she has some wheeze when she coughs but it clears once she coughs. Thought mainly allergies, but progressing now.  -Feels like things get stuck in throat when eating or taking pills, has had troble with swallowing since her lung surgery when tubes down her throat. Also had chemo. Last completed in Oct. She did have upper endoscopy to evaluate this some but has not had a swallow evaluation -Goes back to Dr. Orlie Dakin in June, has mammogram and bone density  Current Medication:  Outpatient Encounter Medications as of 12/29/2022  Medication Sig   albuterol (VENTOLIN HFA) 108 (90 Base) MCG/ACT inhaler Inhale 1-2 puffs into the lungs every 6 (six) hours as needed for wheezing or shortness of breath.   alendronate (FOSAMAX) 70 MG tablet Take 1 tablet (70 mg total) by mouth once a week. Take with a full glass of water on an empty stomach. Stand or sit upright for 1 hour after taking   ALPRAZolam (XANAX) 0.25 MG tablet TAKE 1-2 TABLETS BY MOUTH DAILY AT BEDTIME AS NEEDED FOR ANXIETY   amoxicillin-clavulanate (AUGMENTIN) 875-125 MG tablet Take 1 tablet by mouth 2 (two) times daily. Take with food.   anastrozole (ARIMIDEX) 1 MG tablet Take 1 tablet (1 mg total) by mouth at bedtime.   aspirin-acetaminophen-caffeine (EXCEDRIN MIGRAINE)  250-250-65 MG tablet Take by mouth every 6 (six) hours as needed for headache.   cyclobenzaprine (FLEXERIL) 10 MG tablet Take 1 tablet (10 mg total) by mouth at bedtime. Take one tab po qhs for back spasm prn only   fluconazole (DIFLUCAN) 150 MG tablet Take 1 tablet (150 mg total) by mouth once for 1 dose. May repeat in 3 days if symptoms persist.   fluticasone (FLONASE) 50 MCG/ACT nasal spray Place 2 sprays into both nostrils daily as needed for allergies.   fluticasone-salmeterol (ADVAIR) 250-50 MCG/ACT AEPB Inhale 1 puff into the lungs in the morning and at bedtime.   folic acid (FOLVITE) 1 MG tablet Take 2 mg by mouth in the morning.   gabapentin (NEURONTIN) 300 MG capsule Take 1 capsule (300 mg total) by mouth 2 (two) times daily.   methotrexate (RHEUMATREX) 2.5 MG tablet Take 20 mg by mouth once a week. WEDNESDAY   montelukast (SINGULAIR) 10 MG tablet TAKE (1) TABLET BY MOUTH DAILY AT BEDTIME   omeprazole (PRILOSEC) 20 MG capsule Take 20 mg by mouth daily.   oxybutynin (DITROPAN) 5 MG tablet Take 1 tablet (5 mg total) by mouth every 8 (eight) hours as needed for bladder spasms.   pantoprazole (PROTONIX) 40 MG tablet Take 1 tablet (40 mg total) by mouth daily.   potassium chloride (KLOR-CON) 10 MEQ tablet TAKE (1) TABLET BY MOUTH EVERY DAY   predniSONE (DELTASONE) 10 MG tablet Use per dose pack  rosuvastatin (CRESTOR) 40 MG tablet Take 40 mg by mouth every evening.   sertraline (ZOLOFT) 100 MG tablet TAKE (1) TABLET BY MOUTH EVERY DAY   simethicone (MYLICON) 125 MG chewable tablet Chew 125 mg by mouth every 6 (six) hours as needed for flatulence.   traMADol (ULTRAM) 50 MG tablet Take 1-2 tablets (50-100 mg total) by mouth 3 (three) times daily as needed.   albuterol (PROVENTIL) (2.5 MG/3ML) 0.083% nebulizer solution Take 3 mLs (2.5 mg total) by nebulization every 6 (six) hours as needed for wheezing or shortness of breath. (Patient not taking: Reported on 11/16/2022)   [DISCONTINUED]  prochlorperazine (COMPAZINE) 10 MG tablet Take 1 tablet (10 mg total) by mouth every 6 (six) hours as needed (Nausea or vomiting).   No facility-administered encounter medications on file as of 12/29/2022.      Medical History: Past Medical History:  Diagnosis Date   Adenoma of right adrenal gland 12/07/2021   a.) CT chest 12/07/2021; measured 2.3 cm; not FDG avid on 01/06/2022 PET CT.   Anxiety    a.) uses BZO (alprazolam) PRN   Aortic atherosclerosis    Arthritis    Bladder mass 12/07/2021   a.) CT abd 12/07/2021 --> 9 x 8 mm nodular enhancing focus at the base of the bladder; suspicious for urothelial neoplasm.   COPD (chronic obstructive pulmonary disease)    Coronary artery disease 10/16/2009   a.) LHC/PCI 10/16/2009: EF 60%; 50% OM1, 99% dRCA (3.0 x 18 mm Xience V DES). b.) LHC 07/31/2014: EF 65%; 40% pLAD, 20% pLCx, 40% OM1, 30% oRCA, 50% pRCA, 20% mRCA, 10% ISR stent to dRCA; further intervention deferred opting for med. mgmt.   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 b.) s/p lumpectomy, adjuvant XRT; currently on extended AI (anastrozole) therapy   Essential hypertension    GERD (gastroesophageal reflux disease)    HLD (hyperlipidemia)    Left thyroid nodule 12/07/2021   a.) CT chest; measured 2.6 cm   Long term current use of immunosuppressive drug    a.) MTX for RA.   OSA on CPAP    currently not using   Osteopenia    Personal history of radiation therapy    Pneumonia    PRES (posterior reversible encephalopathy syndrome) 07/14/2014   Pulmonary nodules 11/08/2021   a.) CXR 11/08/2021: 18mm nodular density RUL. b.) CT chest 12/07/2021: 1.9 x 1.4 x 2.5 spiculated lesion anterior RUL. c.) PET CT 01/06/2022: 2.2 x 1.9 spiculated anterior RUL mass (SUV max 16.4); d.) Bx (+) NSCLC, favoring SCC with tumor necrosis; p40/CK7/GATA3 (+), TTF-1 (-)    Rheumatoid arthritis    a.) on MTX   Seasonal allergies    Seizures 2014   a.) x 1 episode; etiology never determined.   Sinus headache    Squamous cell lung cancer, right 03/17/2022   a.) RUL lobectomy 03/17/2022 --> Bx (+) for invasive moderately differentiated SCC of the RIGHT upper lobe (PDL-1: 5%) --> stage IIIA SCC (pT3, pN1, cM0)   ST elevation myocardial infarction (STEMI) of inferior wall 10/16/2009   a.) LHC/PCI 10/16/2009: EF 60%; 50% OM1, 99% dRCA (3.0 x 18 mm Xience V DES).   Tubular adenoma    Valvular heart disease      Vital Signs: BP 128/70   Pulse 75   Temp 97.8 F (36.6 C)  Resp 16   Ht  (1.575 m)   Wt 134 lb (60.8 kg)   SpO2 95%   BMI 24.51 kg/m    Review of Systems  Constitutional:  Negative for chills, fatigue and unexpected weight change.  HENT:  Positive for congestion, postnasal drip, rhinorrhea, sore throat and trouble swallowing. Negative for sneezing.   Eyes:  Negative for redness.  Respiratory:  Positive for cough and wheezing. Negative for chest tightness and shortness of breath.   Cardiovascular:  Negative for chest pain and palpitations.  Gastrointestinal:  Negative for abdominal pain, constipation, diarrhea, nausea and vomiting.       Reflux  Genitourinary:  Negative for dysuria and frequency.  Musculoskeletal:  Positive for arthralgias and myalgias. Negative for back pain, joint swelling and neck pain.  Skin:  Negative for rash.  Neurological: Negative.  Negative for tremors and numbness.  Hematological:  Negative for adenopathy. Does not bruise/bleed easily.  Psychiatric/Behavioral:  Positive for behavioral problems (Depression) and sleep disturbance. Negative for self-injury and suicidal ideas. The patient is nervous/anxious.     Physical Exam Vitals reviewed.  Constitutional:      General: She is not in acute distress.    Appearance: Normal appearance. She is not ill-appearing.  HENT:     Head: Normocephalic and  atraumatic.     Right Ear: Tympanic membrane normal.     Left Ear: Tympanic membrane normal.  Eyes:     Pupils: Pupils are equal, round, and reactive to light.  Cardiovascular:     Rate and Rhythm: Normal rate and regular rhythm.  Pulmonary:     Effort: Pulmonary effort is normal. No respiratory distress.     Breath sounds: No wheezing or rales.  Abdominal:     General: Abdomen is flat.  Musculoskeletal:        General: Tenderness present. Normal range of motion.     Cervical back: Normal range of motion. Tenderness present.     Comments: Right side of neck and upper back tenderness  Skin:    General: Skin is warm and dry.  Neurological:     Mental Status: She is alert and oriented to person, place, and time.  Psychiatric:        Mood and Affect: Mood normal.        Behavior: Behavior normal.       Assessment/Plan: 1. Dysphagia, unspecified type Will order swallow study to evaluate further - DG SWALLOW STUDY OP MEDICARE SPEECH PATH; Future  2. Neck pain on right side Will start with 1/2 tab flexeril and increase to full tab if needed before bed. Discussed possible side effects including drowsiness, especially with her other medications. Pt expressed understanding and will start at 1/2 tab. Continue heating pad and gentle ROM stretching exercises - cyclobenzaprine (FLEXERIL) 10 MG tablet; Take 1 tablet (10 mg total) by mouth at bedtime. Take one tab po qhs for back spasm prn only  Dispense: 30 tablet; Refill: 1  3. Acute non-recurrent frontal sinusitis Will start on augmentin, continue nasal spray - amoxicillin-clavulanate (AUGMENTIN) 875-125 MG tablet; Take 1 tablet by mouth 2 (two) times daily. Take with food.  Dispense: 20 tablet; Refill: 0   General Counseling: Michelle Moore verbalizes understanding of the findings of todays visit and agrees with plan of treatment. I have discussed any further diagnostic evaluation that may be needed or ordered today. We also reviewed her  medications today. she has been encouraged to call the office with any questions or concerns that  should arise related to todays visit.    Counseling:    Orders Placed This Encounter  Procedures   DG SWALLOW STUDY OP MEDICARE SPEECH PATH    Meds ordered this encounter  Medications   amoxicillin-clavulanate (AUGMENTIN) 875-125 MG tablet    Sig: Take 1 tablet by mouth 2 (two) times daily. Take with food.    Dispense:  20 tablet    Refill:  0   cyclobenzaprine (FLEXERIL) 10 MG tablet    Sig: Take 1 tablet (10 mg total) by mouth at bedtime. Take one tab po qhs for back spasm prn only    Dispense:  30 tablet    Refill:  1   fluconazole (DIFLUCAN) 150 MG tablet    Sig: Take 1 tablet (150 mg total) by mouth once for 1 dose. May repeat in 3 days if symptoms persist.    Dispense:  3 tablet    Refill:  0    Time spent:30 Minutes

## 2022-12-29 NOTE — Telephone Encounter (Signed)
Lvm with Marylu Lund at [facility] scheduling to schedule swallow appointment-Toni

## 2022-12-29 NOTE — Telephone Encounter (Signed)
Notified patient of swallow study appointment date, arrival time & location-Toni

## 2023-01-02 ENCOUNTER — Other Ambulatory Visit: Payer: Self-pay | Admitting: Physician Assistant

## 2023-01-02 ENCOUNTER — Telehealth: Payer: Self-pay

## 2023-01-02 DIAGNOSIS — M542 Cervicalgia: Secondary | ICD-10-CM

## 2023-01-02 NOTE — Telephone Encounter (Signed)
Message send to [facility] for referral

## 2023-01-03 ENCOUNTER — Telehealth: Payer: Self-pay | Admitting: Physician Assistant

## 2023-01-03 NOTE — Telephone Encounter (Signed)
Orthopedic referral sent via [facility] to [facility]-Toni 

## 2023-01-06 ENCOUNTER — Ambulatory Visit
Admission: RE | Admit: 2023-01-06 | Discharge: 2023-01-06 | Disposition: A | Discharge status: Home / Self Care | Payer: Medicare HMO | Source: Ambulatory Visit | Attending: Physician Assistant | Admitting: Physician Assistant

## 2023-01-06 DIAGNOSIS — R1310 Dysphagia, unspecified: Secondary | ICD-10-CM | POA: Diagnosis not present

## 2023-01-06 DIAGNOSIS — Z85118 Personal history of other malignant neoplasm of bronchus and lung: Secondary | ICD-10-CM | POA: Diagnosis not present

## 2023-01-06 NOTE — Procedures (Signed)
  Modified Barium Swallow Study  Patient Details  Name: Michelle Moore MRN: [id number] Date of Birth: [birth year]  Today's Date: 01/06/2023  Modified Barium Swallow completed.  Full report located under Chart Review in the Imaging Section.  History of Present Illness Pt is a 67 year old female who was referred by her PCP, Lynn Ito, because pt "Feels like things get stuck in throat when eating or taking pills, has had troble with swallowing since her lung surgery when tubes down her throat." Pt with EGD on 10/07/2022 that revealed normal esophagus, stomach, duodenum, no cause for the pt's symptoms   Clinical Impression Pt presents with adequate oropharyngeal abilities when consuming thin liquids, puree, regular solids and whole barium tablet with thin liquids. During AP view, barium tablet appeared to have slow transit thru esophagus. There also appeared to be some bolus statsis in mid-esphagus. At this time, pt doesn't have any oropharyngeal dysphagia to explain her symptoms. Factors that may increase risk of adverse event in presence of aspiration Rubye Oaks & Clearance Coots 2021):    Swallow Evaluation Recommendations Recommendations: PO diet PO Diet Recommendation: Regular;Thin liquids (Level 0) Liquid Administration via: Cup;Straw Medication Administration: Whole meds with liquid Supervision: Patient able to self-feed Swallowing strategies  : Minimize environmental distractions;Slow rate;Small bites/sips Postural changes: Position pt fully upright for meals Oral care recommendations: Oral care BID (2x/day)   Ahmoni Edge B. Dreama Saa, M.S., CCC-SLP, Tree surgeon Certified Brain Injury Specialist [facility]  [facility] Office [phone] Ascom [phone] Fax [phone]

## 2023-01-09 ENCOUNTER — Other Ambulatory Visit: Payer: Self-pay | Admitting: Physician Assistant

## 2023-01-18 ENCOUNTER — Telehealth: Payer: Self-pay

## 2023-01-18 NOTE — Telephone Encounter (Signed)
Pt notified result

## 2023-01-18 NOTE — Telephone Encounter (Signed)
-----   Message from Carlean Jews, PA-C sent at 01/18/2023  1:01 PM EDT ----- Please let her know that her swallow study did not reveal any swallow dysfunction. The tablet did move slowly through esophagus but otherwise normal

## 2023-01-25 ENCOUNTER — Other Ambulatory Visit: Payer: Self-pay | Admitting: Physician Assistant

## 2023-01-25 DIAGNOSIS — J3089 Other allergic rhinitis: Secondary | ICD-10-CM

## 2023-01-26 ENCOUNTER — Telehealth: Payer: Self-pay | Admitting: Physician Assistant

## 2023-01-26 NOTE — Telephone Encounter (Signed)
Orthopedics, referral has been closed due to patient not replying to calls to schedule-Toni 

## 2023-02-07 ENCOUNTER — Other Ambulatory Visit: Payer: Self-pay | Admitting: Internal Medicine

## 2023-02-07 DIAGNOSIS — F411 Generalized anxiety disorder: Secondary | ICD-10-CM

## 2023-02-13 ENCOUNTER — Other Ambulatory Visit (HOSPITAL_COMMUNITY): Payer: Self-pay

## 2023-02-15 ENCOUNTER — Ambulatory Visit: Payer: Medicare HMO | Admitting: Urology

## 2023-02-15 ENCOUNTER — Telehealth: Payer: Self-pay

## 2023-02-15 ENCOUNTER — Other Ambulatory Visit: Payer: Self-pay

## 2023-02-15 VITALS — BP 124/73 | HR 105

## 2023-02-15 DIAGNOSIS — Z87448 Personal history of other diseases of urinary system: Secondary | ICD-10-CM

## 2023-02-15 DIAGNOSIS — M5416 Radiculopathy, lumbar region: Secondary | ICD-10-CM | POA: Diagnosis not present

## 2023-02-15 DIAGNOSIS — M48062 Spinal stenosis, lumbar region with neurogenic claudication: Secondary | ICD-10-CM | POA: Diagnosis not present

## 2023-02-15 DIAGNOSIS — D494 Neoplasm of unspecified behavior of bladder: Secondary | ICD-10-CM

## 2023-02-15 DIAGNOSIS — Z79899 Other long term (current) drug therapy: Secondary | ICD-10-CM | POA: Diagnosis not present

## 2023-02-15 DIAGNOSIS — Z8551 Personal history of malignant neoplasm of bladder: Secondary | ICD-10-CM | POA: Diagnosis not present

## 2023-02-15 DIAGNOSIS — Z08 Encounter for follow-up examination after completed treatment for malignant neoplasm: Secondary | ICD-10-CM

## 2023-02-15 DIAGNOSIS — M542 Cervicalgia: Secondary | ICD-10-CM | POA: Diagnosis not present

## 2023-02-15 DIAGNOSIS — M5136 Other intervertebral disc degeneration, lumbar region: Secondary | ICD-10-CM | POA: Diagnosis not present

## 2023-02-15 DIAGNOSIS — M5412 Radiculopathy, cervical region: Secondary | ICD-10-CM | POA: Diagnosis not present

## 2023-02-15 DIAGNOSIS — M5030 Other cervical disc degeneration, unspecified cervical region: Secondary | ICD-10-CM | POA: Diagnosis not present

## 2023-02-15 MED ORDER — CEPHALEXIN 500 MG PO CAPS: 500.0000 mg | ORAL_CAPSULE | Freq: Three times a day (TID) | ORAL | 0 refills | Status: DC

## 2023-02-15 NOTE — Telephone Encounter (Signed)
Pt  called her eyes swollen  and red  and pt send [place] pictures as per dr Welton Flakes we can send her keflex and prednisone also lmom pt call us back and then we can send her antibiotic and made her appt for tomorrow

## 2023-02-15 NOTE — Telephone Encounter (Signed)
Pt called that her eyes is swollen and red and pt send pictures she said other dr she had appt they said looks like cellulitis dr Welton Flakes saw pictures and we send keflex  for 7 days  and also prednisone but pt already had prednisone also advised that make her appt tomorrow but if its symptoms  worse  ED

## 2023-02-15 NOTE — Progress Notes (Signed)
   02/15/23  CC:  Chief Complaint  Patient presents with   Cysto    HPI: 67 year old female with personal history of presumed bladder cancer presents today for surveillance cystoscopy.  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  TURBT was delayed for several issues including for lobectomy and initiation of chemo for new diagnosis of lung cancer.   Ultimately, she underwent TURBT on 05/02/2022.  Intraoperatively, the lesion was much smaller than the patient in the office and had almost an atrophic-like appearance.  She also received postoperative gemcitabine.   Surgical pathology was consistent mildly thickened surface urothelial mucosa with no dysplasia was appreciated.  Presumably involution of the tumor was related to Taxotere and cisplatin which she is receiving for lung cancer.   Blood pressure 101/66, pulse 93, height 5\' 2"  (1.575 m), weight 137 lb (62.1 kg). NED. A&Ox3.   No respiratory distress   Abd soft, NT, ND Normal external genitalia with patent urethral meatus  Cystoscopy Procedure Note  Patient identification was confirmed, informed consent was obtained, and patient was prepped using Betadine solution.  Lidocaine jelly was administered per urethral meatus.    Procedure: - Flexible cystoscope introduced, without any difficulty.   - Thorough search of the bladder revealed:    normal urethral meatus    normal urothelium with stellate scar near bladder neck    no stones    no ulcers     no tumors    no urethral polyps    no trabeculation  - Ureteral orifices were normal in position and appearance.  Post-Procedure: - Patient tolerated  the procedure well  Assessment/ Plan:  1. History of bladder mass Cystoscopy today was reassuring, area of necrosis is now healed  Recommend follow-up cystoscopy in 6 months for surveillance - Urinalysis, Complete   Vanna Scotland, MD

## 2023-02-16 ENCOUNTER — Ambulatory Visit (INDEPENDENT_AMBULATORY_CARE_PROVIDER_SITE_OTHER): Payer: Medicare HMO | Admitting: Physician Assistant

## 2023-02-16 ENCOUNTER — Encounter: Payer: Self-pay | Admitting: Physician Assistant

## 2023-02-16 ENCOUNTER — Other Ambulatory Visit: Payer: Self-pay

## 2023-02-16 VITALS — BP 140/80 | HR 80 | Temp 97.8°F | Resp 16 | Ht 60.0 in | Wt 135.0 lb

## 2023-02-16 DIAGNOSIS — R220 Localized swelling, mass and lump, head: Secondary | ICD-10-CM

## 2023-02-16 DIAGNOSIS — R238 Other skin changes: Secondary | ICD-10-CM | POA: Diagnosis not present

## 2023-02-16 LAB — MICROSCOPIC EXAMINATION

## 2023-02-16 LAB — URINALYSIS, COMPLETE
Bilirubin, UA: NEGATIVE
Glucose, UA: NEGATIVE
Ketones, UA: NEGATIVE
Nitrite, UA: NEGATIVE
Protein,UA: NEGATIVE
Specific Gravity, UA: 1.015 (ref 1.005–1.030)
Urobilinogen, Ur: 0.2 mg/dL (ref 0.2–1.0)
pH, UA: 5.5 (ref 5.0–7.5)

## 2023-02-16 NOTE — Progress Notes (Signed)
[facility] [address] [place], [place] [address]  Internal MEDICINE  Office Visit Note  Patient Name: Michelle Moore  [id number]  [id number]  Date of Service: 02/16/2023  Chief Complaint  Patient presents with   Acute Visit    Swollen around both eyes     HPI Pt is here for a sick visit. -Eyes started itching Tuesday, then swollen and red around eyes when she woke up yesterday. 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 last week, but otherwise no sick contacts -She was started on prednisone and keflex yesterday and is much improved today, just a little itchy skin still and still hurts some -Taking coricidin -has a 6 day taper of prednisone and will complete this along with keflex and notify office if not continuing to improve or any new or worsening symptoms  Current Medication:  Outpatient Encounter Medications as of 02/16/2023  Medication Sig   albuterol (PROVENTIL) (2.5 MG/3ML) 0.083% nebulizer solution Take 3 mLs (2.5 mg total) by nebulization every 6 (six) hours as needed for wheezing or shortness of breath.   albuterol (VENTOLIN HFA) 108 (90 Base) MCG/ACT inhaler Inhale 1-2 puffs into the lungs every 6 (six) hours as needed for wheezing or shortness of breath.   alendronate (FOSAMAX) 70 MG tablet Take 1 tablet (70 mg total) by mouth once a week. Take with a full glass of water on an empty stomach. Stand or sit upright for 1 hour after taking   ALPRAZolam (XANAX) 0.25 MG tablet TAKE 1-2 TABLETS BY MOUTH DAILY AT BEDTIME AS NEEDED FOR ANXIETY   anastrozole (ARIMIDEX) 1 MG tablet Take 1 tablet (1 mg total) by mouth at bedtime.    aspirin-acetaminophen-caffeine (EXCEDRIN MIGRAINE) 250-250-65 MG tablet Take by mouth every 6 (six) hours as needed for headache.   cephALEXin (KEFLEX) 500 MG capsule Take 1 capsule (500 mg total) by mouth 3 (three) times daily.   fluticasone (FLONASE) 50 MCG/ACT nasal spray Place 2 sprays into both nostrils daily as needed for allergies.   fluticasone-salmeterol (ADVAIR) 250-50 MCG/ACT AEPB Inhale 1 puff into the lungs in the morning and at bedtime.   folic acid (FOLVITE) 1 MG tablet Take 2 mg by mouth in the morning.   gabapentin (NEURONTIN) 300 MG capsule Take 1 capsule (300 mg total) by mouth 2 (two) times daily.   methotrexate (RHEUMATREX) 2.5 MG tablet Take 20 mg by mouth once a week. WEDNESDAY   montelukast (SINGULAIR) 10 MG tablet TAKE (1) TABLET BY MOUTH  AT BEDTIME   omeprazole (PRILOSEC) 20 MG capsule Take 20 mg by mouth daily.   potassium chloride (KLOR-CON) 10 MEQ tablet TAKE (1) TABLET BY MOUTH EVERY DAY   predniSONE (DELTASONE) 10 MG tablet Use per dose pack   rosuvastatin (CRESTOR) 40 MG tablet Take 40 mg by mouth every evening.   sertraline (ZOLOFT) 100 MG tablet TAKE (1) TABLET BY MOUTH EVERY DAY   traMADol (ULTRAM) 50 MG tablet Take 1-2 tablets (50-100 mg total) by mouth 3 (three) times daily as needed.   [DISCONTINUED] prochlorperazine (COMPAZINE) 10 MG tablet Take 1 tablet (10 mg total) by mouth every 6 (six) hours as needed (Nausea  or vomiting).   No facility-administered encounter medications on file as of 02/16/2023.      Medical History: Past Medical History:  Diagnosis Date   Adenoma of right adrenal gland 12/07/2021   a.) CT chest 12/07/2021; measured 2.3 cm; not FDG avid on 01/06/2022 PET CT.   Anxiety    a.) uses BZO (alprazolam) PRN   Aortic atherosclerosis (HCC)    Arthritis    Bladder mass 12/07/2021   a.) CT abd 12/07/2021 --> 9 x 8 mm nodular enhancing focus at the base of the bladder; suspicious for urothelial neoplasm.   COPD (chronic obstructive  pulmonary disease) (HCC)    Coronary artery disease 10/16/2009   a.) LHC/PCI 10/16/2009: EF 60%; 50% OM1, 99% dRCA (3.0 x 18 mm Xience V DES). b.) LHC 07/31/2014: EF 65%; 40% pLAD, 20% pLCx, 40% OM1, 30% oRCA, 50% pRCA, 20% mRCA, 10% ISR stent to dRCA; further intervention deferred opting for med. mgmt.   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 b.) s/p lumpectomy, adjuvant XRT; currently on extended AI (anastrozole) therapy   Essential hypertension    GERD (gastroesophageal reflux disease)    HLD (hyperlipidemia)    Left thyroid nodule 12/07/2021   a.) CT chest; measured 2.6 cm   Long term current use of immunosuppressive drug    a.) MTX for RA.   OSA on CPAP    currently not using   Osteopenia    Personal history of radiation therapy    Pneumonia    PRES (posterior reversible encephalopathy syndrome) 07/14/2014   Pulmonary nodules 11/08/2021   a.) CXR 11/08/2021: 18mm nodular density RUL. b.) CT chest 12/07/2021: 1.9 x 1.4 x 2.5 spiculated lesion anterior RUL. c.) PET CT 01/06/2022: 2.2 x 1.9 spiculated anterior RUL mass (SUV max 16.4); d.) Bx (+) NSCLC, favoring SCC with tumor necrosis; p40/CK7/GATA3 (+), TTF-1 (-)   Rheumatoid arthritis (HCC)    a.) on MTX   Seasonal allergies    Seizures (HCC) 2014   a.) x 1 episode; etiology never determined.   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   Tubular adenoma    Valvular heart disease      Vital Signs: BP (!) 140/80   Pulse 80   Temp 97.8 F (36.6 C)   Resp 16    Ht 5' (1.524 m)   Wt 135 lb (61.2 kg)   SpO2 96%   BMI 26.37 kg/m    Review of Systems  Constitutional:  Negative for fatigue and fever.  HENT:  Negative for congestion, mouth sores and postnasal drip.   Eyes:  Positive for itching. Negative for discharge, redness and visual disturbance.  Respiratory:  Negative for cough.   Cardiovascular:  Negative for chest pain.  Genitourinary:  Negative for flank pain.  Skin:  Positive for rash.       Redness and itching around eyes   Psychiatric/Behavioral: Negative.      Physical Exam Vitals and nursing note reviewed.  Constitutional:  Appearance: Normal appearance.  Eyes:     General:        Right eye: No discharge.        Left eye: No discharge.     Conjunctiva/sclera: Conjunctivae normal.     Pupils: Pupils are equal, round, and reactive to light.     Comments: Redness around eyes without any changes to eye itself, improved from picture taken yesterday  Cardiovascular:     Rate and Rhythm: Normal rate and regular rhythm.  Pulmonary:     Effort: Pulmonary effort is normal.     Breath sounds: Normal breath sounds.  Skin:    General: Skin is warm and dry.     Findings: Erythema present.  Neurological:     Mental Status: She is alert.  Psychiatric:        Mood and Affect: Mood normal.        Behavior: Behavior normal.       Assessment/Plan: 1. Swelling around both eyes Improved, continue keflex and prednisone and contact office if any problems  2. Skin irritation Improved, continue keflex and prednisone and contact office if any problems   General Counseling: Aisatou verbalizes understanding of the findings of todays visit and agrees with plan of treatment. I have discussed any further diagnostic evaluation that may be needed or ordered today. We also reviewed her medications today. she has been encouraged to call the office with any questions or concerns that should arise related to todays  visit.    Counseling:    No orders of the defined types were placed in this encounter.   No orders of the defined types were placed in this encounter.   Time spent:25 Minutes

## 2023-03-09 ENCOUNTER — Ambulatory Visit (INDEPENDENT_AMBULATORY_CARE_PROVIDER_SITE_OTHER): Payer: Medicare HMO | Admitting: Physician Assistant

## 2023-03-09 ENCOUNTER — Encounter: Payer: Self-pay | Admitting: Physician Assistant

## 2023-03-09 VITALS — BP 112/72 | HR 103 | Temp 98.1°F | Resp 16 | Ht 60.0 in | Wt 135.0 lb

## 2023-03-09 DIAGNOSIS — R300 Dysuria: Secondary | ICD-10-CM | POA: Diagnosis not present

## 2023-03-09 DIAGNOSIS — F320 Major depressive disorder, single episode, mild: Secondary | ICD-10-CM

## 2023-03-09 DIAGNOSIS — F411 Generalized anxiety disorder: Secondary | ICD-10-CM | POA: Diagnosis not present

## 2023-03-09 DIAGNOSIS — C3491 Malignant neoplasm of unspecified part of right bronchus or lung: Secondary | ICD-10-CM

## 2023-03-09 DIAGNOSIS — N390 Urinary tract infection, site not specified: Secondary | ICD-10-CM

## 2023-03-09 DIAGNOSIS — R319 Hematuria, unspecified: Secondary | ICD-10-CM

## 2023-03-09 LAB — POCT URINALYSIS DIPSTICK
Bilirubin, UA: NEGATIVE
Glucose, UA: NEGATIVE
Nitrite, UA: NEGATIVE
Protein, UA: NEGATIVE
Spec Grav, UA: 1.01 (ref 1.010–1.025)
Urobilinogen, UA: 0.2 E.U./dL
pH, UA: 5 (ref 5.0–8.0)

## 2023-03-09 MED ORDER — NITROFURANTOIN MONOHYD MACRO 100 MG PO CAPS
ORAL_CAPSULE | ORAL | 0 refills | Status: DC
Start: 2023-03-09 — End: 2023-05-19

## 2023-03-09 NOTE — Progress Notes (Signed)
[facility] [address] [place], [place] [address]  Internal MEDICINE  Office Visit Note  Patient Name: Michelle Moore  [id number]  [id number]  Date of Service: 03/22/2023  Chief Complaint  Patient presents with   Follow-up   Depression   Gastroesophageal Reflux   Hypertension   Hyperlipidemia   Urinary Tract Infection    Frequency, lower back pain, pelvic pressure, fatigue   Quality Metric Gaps    TDAP and Shingles Vaccines    HPI Pt is here for routine follow up and sick visit -Having some UTI symptoms with pressure, left flank pain and frequency. No incontinence -Still having some shoulder/neck pain and is seeing phys medicine. New pillow did help -Continues to follow up with oncology and rheumatology  Current Medication: Outpatient Encounter Medications as of 03/09/2023  Medication Sig   albuterol (PROVENTIL) (2.5 MG/3ML) 0.083% nebulizer solution Take 3 mLs (2.5 mg total) by nebulization every 6 (six) hours as needed for wheezing or shortness of breath.   albuterol (VENTOLIN HFA) 108 (90 Base) MCG/ACT inhaler Inhale 1-2 puffs into the lungs every 6 (six) hours as needed for wheezing or shortness of breath.   alendronate (FOSAMAX) 70 MG tablet Take 1 tablet (70 mg total) by mouth once a week. Take with a full glass of water on an empty stomach. Stand or sit upright for 1 hour after taking   ALPRAZolam (XANAX) 0.25 MG tablet TAKE 1-2 TABLETS BY MOUTH DAILY AT BEDTIME AS NEEDED FOR ANXIETY   anastrozole (ARIMIDEX) 1 MG tablet Take 1 tablet (1 mg total) by mouth at bedtime.   aspirin-acetaminophen-caffeine (EXCEDRIN MIGRAINE) 250-250-65 MG tablet Take by mouth every 6 (six) hours as needed for headache.   cephALEXin (KEFLEX) 500 MG capsule Take 1 capsule (500 mg total) by mouth 3 (three) times daily.   fluticasone (FLONASE) 50 MCG/ACT nasal spray Place 2 sprays into both nostrils daily as needed for allergies.   folic acid (FOLVITE) 1 MG tablet Take 2 mg by mouth  in the morning.   gabapentin (NEURONTIN) 300 MG capsule Take 1 capsule (300 mg total) by mouth 2 (two) times daily.   methotrexate (RHEUMATREX) 2.5 MG tablet Take 20 mg by mouth once a week. WEDNESDAY   montelukast (SINGULAIR) 10 MG tablet TAKE (1) TABLET BY MOUTH  AT BEDTIME   nitrofurantoin, macrocrystal-monohydrate, (MACROBID) 100 MG capsule Take 1 cap twice per day for 10 days.   omeprazole (PRILOSEC) 20 MG capsule Take 20 mg by mouth daily.   potassium chloride (KLOR-CON) 10 MEQ tablet TAKE (1) TABLET BY MOUTH EVERY DAY   predniSONE (DELTASONE) 10 MG tablet Use per dose pack   rosuvastatin (CRESTOR) 40 MG tablet Take 40 mg by mouth every evening.   sertraline (ZOLOFT) 100 MG tablet TAKE (1) TABLET BY MOUTH EVERY DAY   traMADol (ULTRAM) 50 MG tablet Take 1-2 tablets (50-100 mg total) by mouth 3 (three) times daily as needed.   [DISCONTINUED] fluticasone-salmeterol (ADVAIR) 250-50 MCG/ACT AEPB Inhale 1 puff into the lungs in the morning and at bedtime.   [DISCONTINUED] prochlorperazine (COMPAZINE) 10 MG tablet Take 1 tablet (10 mg total) by mouth every 6 (six) hours as needed (Nausea or vomiting).   No facility-administered encounter medications on file as of 03/09/2023.    Surgical History: Past Surgical History:  Procedure Laterality Date   ABDOMINAL HYSTERECTOMY     ANTERIOR CERVICAL DECOMP/DISCECTOMY FUSION N/A 01/20/2014   Procedure: ANTERIOR CERVICAL DECOMPRESSION/DISCECTOMY FUSION 2 LEVELS cervical five/six six Arline Asp;  Surgeon:  Cristi Loron, MD;  Location: [facility];  Service: Neurosurgery;  Laterality: N/A;   APPENDECTOMY     BACK SURGERY     lumbar   BLADDER INSTILLATION N/A 05/02/2022   Procedure: BLADDER INSTILLATION OF GEMCITABINE;  Surgeon: Vanna Scotland, MD;  Location: [facility];  Service: Urology;  Laterality: N/A;   BREAST BIOPSY Right 02/01/2018   x shape, DCIS    BREAST BIOPSY  01/16/2019   coil, BENIGN MAMMARY TISSUE WITH MODERATE STROMAL FIBROSIS AND  COARSE DYSTROPHIC CALCIFICATIONS of the RIGHT breast   BREAST LUMPECTOMY Right 03/07/2018   Procedure: BREAST LUMPECTOMY/ RE EXCISION;  Surgeon: Carolan Shiver, MD;  Location: [facility];  Service: General;  Laterality: Right;   CARDIAC CATHETERIZATION Left 07/31/2014   Procedure: CARDIAC CATHETERIZATION; Location: [facility]; Surgeon: Arnoldo Hooker, MD   COLONOSCOPY WITH PROPOFOL N/A 02/11/2021   Procedure: COLONOSCOPY WITH PROPOFOL;  Surgeon: Midge Minium, MD;  Location: [facility];  Service: Endoscopy;  Laterality: N/A;  Requests Early   CORONARY ANGIOPLASTY WITH STENT PLACEMENT Left 10/16/2009   Procedure: CORONARY ANGIOPLASTY WITH STENT PLACEMENT; Location: [facility]; Surgeon: Rudean Hitt, MD   CYSTOSCOPY W/ RETROGRADES Bilateral 05/02/2022   Procedure: CYSTOSCOPY WITH RETROGRADE PYELOGRAM;  Surgeon: Vanna Scotland, MD;  Location: [facility];  Service: Urology;  Laterality: Bilateral;   ESOPHAGOGASTRODUODENOSCOPY (EGD) WITH PROPOFOL N/A 10/07/2022   Procedure: ESOPHAGOGASTRODUODENOSCOPY (EGD) WITH PROPOFOL;  Surgeon: Midge Minium, MD;  Location: [facility];  Service: Endoscopy;  Laterality: N/A;  sleep apnea   INTERCOSTAL NERVE BLOCK Right 03/17/2022   Procedure: INTERCOSTAL NERVE BLOCK;  Surgeon: Loreli Slot, MD;  Location: [facility] OR;  Service: Thoracic;  Laterality: Right;   LASIK Bilateral    LUNG LOBECTOMY Right 03/17/2022   right upper lobe   LYMPH NODE DISSECTION Right 03/17/2022   Procedure: LYMPH NODE DISSECTION;  Surgeon: Loreli Slot, MD;  Location: [facility] OR;  Service: Thoracic;  Laterality: Right;   PARTIAL MASTECTOMY WITH NEEDLE LOCALIZATION Right 02/21/2018   Procedure: PARTIAL MASTECTOMY WITH NEEDLE LOCALIZATION;  Surgeon: Carolan Shiver, MD;  Location: [facility];  Service: General;  Laterality: Right;   POLYPECTOMY N/A 02/11/2021   Procedure: POLYPECTOMY;  Surgeon: Midge Minium, MD;  Location: [facility];  Service: Endoscopy;   Laterality: N/A;   ROTATOR CUFF REPAIR Left    SHOULDER ARTHROSCOPY WITH ROTATOR CUFF REPAIR AND OPEN BICEPS TENODESIS Right 08/07/2019   Procedure: SHOULDER ARTHROSCOPY WITH DEBRIDEMENT, DECOMPRESSION, ROTATOR CUFF REPAIR AND BICEPS TENODESIS. - RNFA;  Surgeon: Christena Flake, MD;  Location: [facility];  Service: Orthopedics;  Laterality: Right;   TOE SURGERY Right    has pin and plate in it   TRANSURETHRAL RESECTION OF BLADDER TUMOR N/A 05/02/2022   Procedure: TRANSURETHRAL RESECTION OF BLADDER TUMOR (TURBT);  Surgeon: Vanna Scotland, MD;  Location: [facility];  Service: Urology;  Laterality: N/A;   VIDEO BRONCHOSCOPY WITH ENDOBRONCHIAL ULTRASOUND N/A 03/02/2022   Procedure: VIDEO BRONCHOSCOPY WITH ENDOBRONCHIAL ULTRASOUND;  Surgeon: Salena Saner, MD;  Location: [facility];  Service: Cardiopulmonary;  Laterality: N/A;    Medical History: Past Medical History:  Diagnosis Date   Adenoma of right adrenal gland 12/07/2021   a.) CT chest 12/07/2021; measured 2.3 cm; not FDG avid on 01/06/2022 PET CT.   Anxiety    a.) uses BZO (alprazolam) PRN   Aortic atherosclerosis (HCC)    Arthritis    Bladder mass 12/07/2021   a.) CT abd 12/07/2021 --> 9 x 8 mm nodular enhancing focus at the base  of the bladder; suspicious for urothelial neoplasm.   COPD (chronic obstructive pulmonary disease) (HCC)    Coronary artery disease 10/16/2009   a.) LHC/PCI 10/16/2009: EF 60%; 50% OM1, 99% dRCA (3.0 x 18 mm Xience V DES). b.) LHC 07/31/2014: EF 65%; 40% pLAD, 20% pLCx, 40% OM1, 30% oRCA, 50% pRCA, 20% mRCA, 10% ISR stent to dRCA; further intervention deferred opting for med. mgmt.   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 b.) s/p lumpectomy, adjuvant XRT; currently on extended AI (anastrozole) therapy   Essential hypertension    GERD (gastroesophageal reflux disease)    HLD  (hyperlipidemia)    Left thyroid nodule 12/07/2021   a.) CT chest; measured 2.6 cm   Long term current use of immunosuppressive drug    a.) MTX for RA.   OSA on CPAP    currently not using   Osteopenia    Personal history of radiation therapy    Pneumonia    PRES (posterior reversible encephalopathy syndrome) 07/14/2014   Pulmonary nodules 11/08/2021   a.) CXR 11/08/2021: 18mm nodular density RUL. b.) CT chest 12/07/2021: 1.9 x 1.4 x 2.5 spiculated lesion anterior RUL. c.) PET CT 01/06/2022: 2.2 x 1.9 spiculated anterior RUL mass (SUV max 16.4); d.) Bx (+) NSCLC, favoring SCC with tumor necrosis; p40/CK7/GATA3 (+), TTF-1 (-)   Rheumatoid arthritis (HCC)    a.) on MTX   Seasonal allergies    Seizures (HCC) 2014   a.) x 1 episode; etiology never determined.   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   Tubular adenoma    Valvular heart disease     Family History: Family History  Problem Relation Age of Onset   Breast cancer Cousin 92       bilateral at 53 and 44; daughter of maternal aunt who was unaffected   Lung cancer Maternal Aunt        dx 82s; deceased 61s; smoker   Heart attack Father        deceased 33   Stomach cancer Maternal Grandmother     Social History   Socioeconomic History   Marital status: Married    Spouse name: Jimmy   Number of children: 1   Years of education: Not on file   Highest education level: Not on file  Occupational History   Not on file  Tobacco Use   Smoking status: Former    Packs/day: 1.00    Years: 30.00    Additional pack years: 0.00    Total pack years: 30.00    Types: Cigarettes    Quit  date: 09/13/2015    Years since quitting: 7.5   Smokeless tobacco: Never  Vaping Use   Vaping Use: Never used  Substance and Sexual Activity   Alcohol use: Yes    Comment: occasional   Drug use: No   Sexual activity: Not on file  Other Topics Concern   Not on file  Social History Narrative   Not on  file   Social Determinants of Health   Financial Resource Strain: Low Risk  (03/31/2022)   Overall Financial Resource Strain (CARDIA)    Difficulty of Paying Living Expenses: Not very hard  Food Insecurity: No Food Insecurity (05/23/2022)   Hunger Vital Sign    Worried About Running Out of Food in the Last Year: Never true    Ran Out of Food in the Last Year: Never true  Transportation Needs: No Transportation Needs (06/30/2022)   PRAPARE - Administrator, Civil Service (Medical): No    Lack of Transportation (Non-Medical): No  Physical Activity: Inactive (03/31/2022)   Exercise Vital Sign    Days of Exercise per Week: 0 days    Minutes of Exercise per Session: 0 min  Stress: No Stress Concern Present (03/31/2022)   [employer] of Occupational Health - Occupational Stress Questionnaire    Feeling of Stress : Only a little  Social Connections: Socially Integrated (03/31/2022)   Social Connection and Isolation Panel [NHANES]    Frequency of Communication with Friends and Family: Three times a week    Frequency of Social Gatherings with Friends and Family: Three times a week    Attends Religious Services: More than 4 times per year    Active Member of Clubs or Organizations: Yes    Attends Banker Meetings: More than 4 times per year    Marital Status: Married  Catering manager Violence: Not At Risk (03/31/2022)   Humiliation, Afraid, Rape, and Kick questionnaire    Fear of Current or Ex-Partner: No    Emotionally Abused: No    Physically Abused: No    Sexually Abused: No      Review of Systems  Constitutional:  Negative for chills, fatigue and  unexpected weight change.  HENT:  Negative for sneezing.   Eyes:  Negative for redness.  Respiratory:  Negative for chest tightness and shortness of breath.   Cardiovascular:  Negative for chest pain and palpitations.  Gastrointestinal:  Negative for abdominal pain, constipation, diarrhea, nausea and vomiting.       Reflux  Genitourinary:  Positive for dysuria, flank pain, frequency, pelvic pain and urgency.  Musculoskeletal:  Positive for arthralgias and myalgias. Negative for back pain, joint swelling and neck pain.  Skin:  Negative for rash.  Neurological: Negative.  Negative for tremors and numbness.  Hematological:  Negative for adenopathy. Does not bruise/bleed easily.  Psychiatric/Behavioral:  Positive for behavioral problems (Depression) and sleep disturbance. Negative for self-injury and suicidal ideas. The patient is nervous/anxious.     Vital Signs: BP 112/72   Pulse (!) 103   Temp 98.1 F (36.7 C)   Resp 16   Ht 5' (1.524 m)   Wt 135 lb (61.2 kg)   SpO2 97%   BMI 26.37 kg/m    Physical Exam Vitals and nursing note reviewed.  Constitutional:      General: She is not in acute distress.    Appearance: Normal appearance. She is well-developed. She is not diaphoretic.  HENT:     Head: Normocephalic and atraumatic.     Mouth/Throat:     Pharynx: No oropharyngeal exudate.  Eyes:     Pupils: Pupils are equal, round, and reactive to light.  Neck:     Thyroid: No thyromegaly.     Vascular: No JVD.     Trachea: No tracheal deviation.  Cardiovascular:     Rate and Rhythm: Normal rate and regular rhythm.  Heart sounds: Normal heart sounds. No murmur heard.    No friction rub. No gallop.  Pulmonary:     Effort: Pulmonary effort is normal. No respiratory distress.     Breath sounds: No wheezing or rales.  Chest:     Chest wall: No tenderness.  Abdominal:     General: Bowel sounds are normal.     Palpations: Abdomen is soft.  Musculoskeletal:        General:  Normal range of motion.     Cervical back: Normal range of motion and neck supple.  Lymphadenopathy:     Cervical: No cervical adenopathy.  Skin:    General: Skin is warm and dry.  Neurological:     Mental Status: She is alert and oriented to person, place, and time.     Cranial Nerves: No cranial nerve deficit.  Psychiatric:        Behavior: Behavior normal.        Thought Content: Thought content normal.        Judgment: Judgment normal.        Assessment/Plan: 1. Squamous cell carcinoma of right lung (HCC) Followed by oncology/pulmonology  2. Generalized anxiety disorder Stable, continue current medications  3. Depression, major, single episode, mild (HCC) Stable, continue current medications  4. Urinary tract infection with hematuria, site unspecified Will go ahead and treat with macrobid and adjust based on C/S - CULTURE, URINE COMPREHENSIVE - nitrofurantoin, macrocrystal-monohydrate, (MACROBID) 100 MG capsule; Take 1 cap twice per day for 10 days.  Dispense: 20 capsule; Refill: 0  5. Dysuria - POCT Urinalysis Dipstick   General Counseling: Haydan verbalizes understanding of the findings of todays visit and agrees with plan of treatment. I have discussed any further diagnostic evaluation that may be needed or ordered today. We also reviewed her medications today. she has been encouraged to call the office with any questions or concerns that should arise related to todays visit.    Orders Placed This Encounter  Procedures   CULTURE, URINE COMPREHENSIVE   POCT Urinalysis Dipstick    Meds ordered this encounter  Medications   nitrofurantoin, macrocrystal-monohydrate, (MACROBID) 100 MG capsule    Sig: Take 1 cap twice per day for 10 days.    Dispense:  20 capsule    Refill:  0    This patient was seen by Lynn Ito, PA-C in collaboration with Dr. Beverely Risen as a part of collaborative care agreement.   Total time spent:30 Minutes Time spent includes  review of chart, medications, test results, and follow up plan with the patient.      Dr Lyndon Code Internal medicine

## 2023-03-13 ENCOUNTER — Encounter: Payer: Self-pay | Admitting: Pulmonary Disease

## 2023-03-15 LAB — CULTURE, URINE COMPREHENSIVE

## 2023-03-18 ENCOUNTER — Other Ambulatory Visit: Payer: Self-pay | Admitting: Pulmonary Disease

## 2023-03-20 DIAGNOSIS — M0579 Rheumatoid arthritis with rheumatoid factor of multiple sites without organ or systems involvement: Secondary | ICD-10-CM | POA: Diagnosis not present

## 2023-03-20 DIAGNOSIS — M159 Polyosteoarthritis, unspecified: Secondary | ICD-10-CM | POA: Diagnosis not present

## 2023-03-20 DIAGNOSIS — Z79899 Other long term (current) drug therapy: Secondary | ICD-10-CM | POA: Diagnosis not present

## 2023-03-21 ENCOUNTER — Ambulatory Visit
Admission: RE | Admit: 2023-03-21 | Discharge: 2023-03-21 | Disposition: A | Discharge status: Home / Self Care | Payer: Medicare HMO | Source: Ambulatory Visit | Attending: Oncology | Admitting: Oncology

## 2023-03-21 DIAGNOSIS — Z780 Asymptomatic menopausal state: Secondary | ICD-10-CM | POA: Insufficient documentation

## 2023-03-21 DIAGNOSIS — Z1231 Encounter for screening mammogram for malignant neoplasm of breast: Secondary | ICD-10-CM | POA: Insufficient documentation

## 2023-03-21 DIAGNOSIS — C3491 Malignant neoplasm of unspecified part of right bronchus or lung: Secondary | ICD-10-CM

## 2023-03-21 DIAGNOSIS — M8589 Other specified disorders of bone density and structure, multiple sites: Secondary | ICD-10-CM | POA: Insufficient documentation

## 2023-03-21 DIAGNOSIS — R9230 Dense breasts, unspecified: Secondary | ICD-10-CM | POA: Insufficient documentation

## 2023-03-21 DIAGNOSIS — M810 Age-related osteoporosis without current pathological fracture: Secondary | ICD-10-CM | POA: Diagnosis not present

## 2023-04-10 ENCOUNTER — Other Ambulatory Visit: Payer: Self-pay | Admitting: Internal Medicine

## 2023-04-10 DIAGNOSIS — F411 Generalized anxiety disorder: Secondary | ICD-10-CM

## 2023-04-10 NOTE — Telephone Encounter (Signed)
Please send med..

## 2023-04-11 ENCOUNTER — Other Ambulatory Visit: Payer: Self-pay | Admitting: Oncology

## 2023-04-13 DIAGNOSIS — Z8616 Personal history of COVID-19: Secondary | ICD-10-CM

## 2023-04-13 HISTORY — DX: Personal history of COVID-19: Z86.16

## 2023-04-24 DIAGNOSIS — H40019 Open angle with borderline findings, low risk, unspecified eye: Secondary | ICD-10-CM | POA: Diagnosis not present

## 2023-05-02 ENCOUNTER — Other Ambulatory Visit: Payer: Self-pay | Admitting: Family Medicine

## 2023-05-02 DIAGNOSIS — M5412 Radiculopathy, cervical region: Secondary | ICD-10-CM

## 2023-05-18 ENCOUNTER — Telehealth: Payer: Self-pay

## 2023-05-18 NOTE — Telephone Encounter (Signed)
Pt called that she having sinusitis advise her we made her appt tomorrow if worse go to Ed or urgent care also if she can do covid test

## 2023-05-19 ENCOUNTER — Encounter: Payer: Self-pay | Admitting: Physician Assistant

## 2023-05-19 ENCOUNTER — Telehealth (INDEPENDENT_AMBULATORY_CARE_PROVIDER_SITE_OTHER): Payer: Medicare HMO | Admitting: Physician Assistant

## 2023-05-19 VITALS — Resp 16 | Ht 60.0 in

## 2023-05-19 DIAGNOSIS — J0110 Acute frontal sinusitis, unspecified: Secondary | ICD-10-CM

## 2023-05-19 MED ORDER — AMOXICILLIN-POT CLAVULANATE 875-125 MG PO TABS
1.0000 | ORAL_TABLET | Freq: Two times a day (BID) | ORAL | 0 refills | Status: DC
Start: 2023-05-19 — End: 2023-06-30

## 2023-05-19 NOTE — Progress Notes (Signed)
[facility] [address] [place], [place] [address]  Internal MEDICINE  Telephone Visit  Patient Name: Michelle Moore  [id number]  [id number]  Date of Service: 05/19/2023  I connected with the patient at 10:43 by telephone and verified the patients identity using two identifiers.   I discussed the limitations, risks, security and privacy concerns of performing an evaluation and management service by telephone and the availability of in person appointments. I also discussed with the patient that there may be a patient responsible charge related to the service.  The patient expressed understanding and agrees to proceed.    Chief Complaint  Patient presents with   Telephone Screen    [phone], patient needs this to be a telephone call   Telephone Assessment   Nasal Congestion    Nasal and chest congestion   Ear Pain   Generalized Body Aches    HPI Pt is here for virtual sick visit -Symptoms for the last week, worse yesterday.  -Sinus congestion and pressure, some coughing. No wheezing. A little SOB at baseline, but no worsening. -is taking mucinex and it does help some -Does not want this to turn into bronchitis, does have hx of lung cancer and has CT next week with oncology  Current Medication: Outpatient Encounter Medications as of 05/19/2023  Medication Sig   albuterol (PROVENTIL) (2.5 MG/3ML) 0.083% nebulizer solution Take 3 mLs (2.5 mg total) by nebulization every 6 (six) hours as needed for wheezing or shortness of breath.   albuterol (VENTOLIN HFA) 108 (90 Base) MCG/ACT inhaler Inhale 1-2 puffs into the lungs every 6 (six) hours as needed for wheezing or shortness of breath.   alendronate (FOSAMAX) 70 MG tablet TAKE 1 TABLET BY MOUTH ONCE A WEEK. TAKE WITH A FULL GLASS OF WATER ON AN EMPTY STOMACH. STAND OR SIT UPRIGHT FOR 1 HOUR AFTER TAKING   ALPRAZolam (XANAX) 0.25 MG tablet TAKE 1-2 TABLETS BY MOUTH DAILY AT BEDTIME AS NEEDED FOR ANXIETY    amoxicillin-clavulanate (AUGMENTIN) 875-125 MG tablet Take 1 tablet by mouth 2 (two) times daily. Take with food.   anastrozole (ARIMIDEX) 1 MG tablet Take 1 tablet (1 mg total) by mouth at bedtime.   aspirin-acetaminophen-caffeine (EXCEDRIN MIGRAINE) 250-250-65 MG tablet Take by mouth every 6 (six) hours as needed for headache.   fluticasone (FLONASE) 50 MCG/ACT nasal spray Place 2 sprays into both nostrils daily as needed for allergies.   fluticasone-salmeterol (ADVAIR) 250-50 MCG/ACT AEPB INHALE 1 PUFF TWICE DAILY IN THE MORNING AND AT BEDTIME   folic acid (FOLVITE) 1 MG tablet Take 2 mg by mouth in the morning.   gabapentin (NEURONTIN) 300 MG capsule Take 1 capsule (300 mg total) by mouth 2 (two) times daily.   methotrexate (RHEUMATREX) 2.5 MG tablet Take 20 mg by mouth once a week. WEDNESDAY   montelukast (SINGULAIR) 10 MG tablet TAKE (1) TABLET BY MOUTH  AT BEDTIME   omeprazole (PRILOSEC) 20 MG capsule Take 20 mg by mouth daily.   potassium chloride (KLOR-CON) 10 MEQ tablet TAKE (1) TABLET BY MOUTH EVERY DAY   predniSONE (DELTASONE) 10 MG tablet Use per dose pack   rosuvastatin (CRESTOR) 40 MG tablet Take 40 mg by mouth every evening.   sertraline (ZOLOFT) 100 MG tablet TAKE (1) TABLET BY MOUTH EVERY DAY   traMADol (ULTRAM) 50 MG tablet Take 1-2 tablets (50-100 mg total) by mouth 3 (three) times daily as needed.   [DISCONTINUED] cephALEXin (KEFLEX) 500 MG capsule Take 1 capsule (500 mg total)  by mouth 3 (three) times daily.   [DISCONTINUED] nitrofurantoin, macrocrystal-monohydrate, (MACROBID) 100 MG capsule Take 1 cap twice per day for 10 days.   [DISCONTINUED] prochlorperazine (COMPAZINE) 10 MG tablet Take 1 tablet (10 mg total) by mouth every 6 (six) hours as needed (Nausea or vomiting).   No facility-administered encounter medications on file as of 05/19/2023.    Surgical History: Past Surgical History:  Procedure Laterality Date   ABDOMINAL HYSTERECTOMY     ANTERIOR CERVICAL  DECOMP/DISCECTOMY FUSION N/A 01/20/2014   Procedure: ANTERIOR CERVICAL DECOMPRESSION/DISCECTOMY FUSION 2 LEVELS cervical five/six six Arline Asp;  Surgeon: Cristi Loron, MD;  Location: [facility];  Service: Neurosurgery;  Laterality: N/A;   APPENDECTOMY     BACK SURGERY     lumbar   BLADDER INSTILLATION N/A 05/02/2022   Procedure: BLADDER INSTILLATION OF GEMCITABINE;  Surgeon: Vanna Scotland, MD;  Location: [facility];  Service: Urology;  Laterality: N/A;   BREAST BIOPSY Right 02/01/2018   x shape, DCIS    BREAST BIOPSY  01/16/2019   coil, BENIGN MAMMARY TISSUE WITH MODERATE STROMAL FIBROSIS AND COARSE DYSTROPHIC CALCIFICATIONS of the RIGHT breast   BREAST LUMPECTOMY Right 03/07/2018   Procedure: BREAST LUMPECTOMY/ RE EXCISION;  Surgeon: Carolan Shiver, MD;  Location: [facility];  Service: General;  Laterality: Right;   CARDIAC CATHETERIZATION Left 07/31/2014   Procedure: CARDIAC CATHETERIZATION; Location: [facility]; Surgeon: Arnoldo Hooker, MD   COLONOSCOPY WITH PROPOFOL N/A 02/11/2021   Procedure: COLONOSCOPY WITH PROPOFOL;  Surgeon: Midge Minium, MD;  Location: [facility];  Service: Endoscopy;  Laterality: N/A;  Requests Early   CORONARY ANGIOPLASTY WITH STENT PLACEMENT Left 10/16/2009   Procedure: CORONARY ANGIOPLASTY WITH STENT PLACEMENT; Location: [facility]; Surgeon: Rudean Hitt, MD   CYSTOSCOPY W/ RETROGRADES Bilateral 05/02/2022   Procedure: CYSTOSCOPY WITH RETROGRADE PYELOGRAM;  Surgeon: Vanna Scotland, MD;  Location: [facility];  Service: Urology;  Laterality: Bilateral;   ESOPHAGOGASTRODUODENOSCOPY (EGD) WITH PROPOFOL N/A 10/07/2022   Procedure: ESOPHAGOGASTRODUODENOSCOPY (EGD) WITH PROPOFOL;  Surgeon: Midge Minium, MD;  Location: [facility];  Service: Endoscopy;  Laterality: N/A;  sleep apnea   INTERCOSTAL NERVE BLOCK Right 03/17/2022   Procedure: INTERCOSTAL NERVE BLOCK;  Surgeon: Loreli Slot, MD;  Location: [facility];  Service: Thoracic;  Laterality: Right;    LASIK Bilateral    LUNG LOBECTOMY Right 03/17/2022   right upper lobe   LYMPH NODE DISSECTION Right 03/17/2022   Procedure: LYMPH NODE DISSECTION;  Surgeon: Loreli Slot, MD;  Location: [facility] OR;  Service: Thoracic;  Laterality: Right;   PARTIAL MASTECTOMY WITH NEEDLE LOCALIZATION Right 02/21/2018   Procedure: PARTIAL MASTECTOMY WITH NEEDLE LOCALIZATION;  Surgeon: Carolan Shiver, MD;  Location: [facility];  Service: General;  Laterality: Right;   POLYPECTOMY N/A 02/11/2021   Procedure: POLYPECTOMY;  Surgeon: Midge Minium, MD;  Location: [facility];  Service: Endoscopy;  Laterality: N/A;   ROTATOR CUFF REPAIR Left    SHOULDER ARTHROSCOPY WITH ROTATOR CUFF REPAIR AND OPEN BICEPS TENODESIS Right 08/07/2019   Procedure: SHOULDER ARTHROSCOPY WITH DEBRIDEMENT, DECOMPRESSION, ROTATOR CUFF REPAIR AND BICEPS TENODESIS. - RNFA;  Surgeon: Christena Flake, MD;  Location: [facility];  Service: Orthopedics;  Laterality: Right;   TOE SURGERY Right    has pin and plate in it   TRANSURETHRAL RESECTION OF BLADDER TUMOR N/A 05/02/2022   Procedure: TRANSURETHRAL RESECTION OF BLADDER TUMOR (TURBT);  Surgeon: Vanna Scotland, MD;  Location: [facility];  Service: Urology;  Laterality: N/A;   VIDEO BRONCHOSCOPY WITH ENDOBRONCHIAL ULTRASOUND N/A 03/02/2022  Procedure: VIDEO BRONCHOSCOPY WITH ENDOBRONCHIAL ULTRASOUND;  Surgeon: Salena Saner, MD;  Location: [facility];  Service: Cardiopulmonary;  Laterality: N/A;    Medical History: Past Medical History:  Diagnosis Date   Adenoma of right adrenal gland 12/07/2021   a.) CT chest 12/07/2021; measured 2.3 cm; not FDG avid on 01/06/2022 PET CT.   Anxiety    a.) uses BZO (alprazolam) PRN   Aortic atherosclerosis (HCC)    Arthritis    Bladder mass 12/07/2021   a.) CT abd 12/07/2021 --> 9 x 8 mm nodular enhancing focus at the base of the bladder; suspicious for urothelial neoplasm.   COPD (chronic obstructive pulmonary disease) (HCC)    Coronary  artery disease 10/16/2009   a.) LHC/PCI 10/16/2009: EF 60%; 50% OM1, 99% dRCA (3.0 x 18 mm Xience V DES). b.) LHC 07/31/2014: EF 65%; 40% pLAD, 20% pLCx, 40% OM1, 30% oRCA, 50% pRCA, 20% mRCA, 10% ISR stent to dRCA; further intervention deferred opting for med. mgmt.   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 b.) s/p lumpectomy, adjuvant XRT; currently on extended AI (anastrozole) therapy   Essential hypertension    GERD (gastroesophageal reflux disease)    HLD (hyperlipidemia)    Left thyroid nodule 12/07/2021   a.) CT chest; measured 2.6 cm   Long term current use of immunosuppressive drug    a.) MTX for RA.   OSA on CPAP    currently not using   Osteopenia    Personal history of radiation therapy    Pneumonia    PRES (posterior reversible encephalopathy syndrome) 07/14/2014   Pulmonary nodules 11/08/2021   a.) CXR 11/08/2021: 18mm nodular density RUL. b.) CT chest 12/07/2021: 1.9 x 1.4 x 2.5 spiculated lesion anterior RUL. c.) PET CT 01/06/2022: 2.2 x 1.9 spiculated anterior RUL mass (SUV max 16.4); d.) Bx (+) NSCLC, favoring SCC with tumor necrosis; p40/CK7/GATA3 (+), TTF-1 (-)   Rheumatoid arthritis (HCC)    a.) on MTX   Seasonal allergies    Seizures (HCC) 2014   a.) x 1 episode; etiology never determined.   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   Tubular adenoma    Valvular heart disease     Family History: Family History  Problem Relation Age of Onset   Breast cancer Cousin 60       bilateral at 58 and  21; daughter of maternal aunt who was unaffected   Lung cancer Maternal Aunt        dx 55s; deceased 46s; smoker   Heart attack Father        deceased 43   Stomach cancer Maternal Grandmother     Social History   Socioeconomic History   Marital status: Married    Spouse name: Jimmy   Number of children: 1   Years of education: Not on file   Highest education level: Not on file  Occupational History   Not on file  Tobacco Use   Smoking status: Former  Current packs/day: 0.00    Average packs/day: 1 pack/day for 30.0 years (30.0 ttl pk-yrs)    Types: Cigarettes    Start date: 09/12/1985    Quit date: 09/13/2015    Years since quitting: 7.6   Smokeless tobacco: Never  Vaping Use   Vaping status: Never Used  Substance and Sexual Activity   Alcohol use: Yes    Comment: occasional   Drug use: No   Sexual activity: Not on file  Other Topics Concern   Not on file  Social History Narrative   Not on file   Social Determinants of Health   Financial Resource Strain: Low Risk  (03/31/2022)   Overall Financial Resource Strain (CARDIA)    Difficulty of Paying Living Expenses: Not very hard  Food Insecurity: No Food Insecurity (05/23/2022)   Hunger Vital Sign    Worried About Running Out of Food in the Last Year: Never true    Ran Out of Food in the Last Year: Never true  Transportation Needs: No Transportation Needs (06/30/2022)   PRAPARE - Administrator, Civil Service (Medical): No    Lack of Transportation (Non-Medical): No  Physical Activity: Inactive (03/31/2022)   Exercise Vital Sign    Days of Exercise per Week: 0 days    Minutes of Exercise per Session: 0 min  Stress: No Stress Concern Present (03/31/2022)   [employer] of Occupational Health - Occupational Stress Questionnaire    Feeling of Stress : Only a little  Social Connections: Socially Integrated (03/31/2022)   Social Connection and Isolation Panel [NHANES]    Frequency of Communication with  Friends and Family: Three times a week    Frequency of Social Gatherings with Friends and Family: Three times a week    Attends Religious Services: More than 4 times per year    Active Member of Clubs or Organizations: Yes    Attends Banker Meetings: More than 4 times per year    Marital Status: Married  Catering manager Violence: Not At Risk (03/31/2022)   Humiliation, Afraid, Rape, and Kick questionnaire    Fear of Current or Ex-Partner: No    Emotionally Abused: No    Physically Abused: No    Sexually Abused: No      Review of Systems  Constitutional:  Negative for chills, fever and unexpected weight change.  HENT:  Positive for congestion, postnasal drip and sinus pressure. Negative for rhinorrhea, sneezing and sore throat.   Eyes:  Negative for redness.  Respiratory:  Positive for cough and shortness of breath. Negative for chest tightness and wheezing.   Cardiovascular:  Negative for chest pain and palpitations.  Gastrointestinal:  Negative for abdominal pain, constipation, diarrhea, nausea and vomiting.  Genitourinary:  Negative for dysuria and frequency.  Musculoskeletal:  Negative for arthralgias, back pain, joint swelling and neck pain.  Skin:  Negative for rash.  Neurological: Negative.  Negative for tremors and numbness.  Hematological:  Negative for adenopathy. Does not bruise/bleed easily.  Psychiatric/Behavioral:  Negative for behavioral problems (Depression), sleep disturbance and suicidal ideas. The patient is not nervous/anxious.     Vital Signs: Resp 16   Ht 5' (1.524 m)   BMI 26.37 kg/m    Observation/Objective:  Pt is able to carry out conversation   Assessment/Plan: 1. Acute non-recurrent frontal sinusitis Will start on augmentin and take with food. May continue mucinex and should stay well hydrated. - amoxicillin-clavulanate (AUGMENTIN) 875-125 MG tablet; Take 1 tablet by mouth 2 (  two) times daily. Take with food.  Dispense: 20 tablet;  Refill: 0   General Counseling: Chalene verbalizes understanding of the findings of today's phone visit and agrees with plan of treatment. I have discussed any further diagnostic evaluation that may be needed or ordered today. We also reviewed her medications today. she has been encouraged to call the office with any questions or concerns that should arise related to todays visit.    No orders of the defined types were placed in this encounter.   Meds ordered this encounter  Medications   amoxicillin-clavulanate (AUGMENTIN) 875-125 MG tablet    Sig: Take 1 tablet by mouth 2 (two) times daily. Take with food.    Dispense:  20 tablet    Refill:  0    Time spent:25 Minutes    Dr Lyndon Code Internal medicine

## 2023-05-20 ENCOUNTER — Encounter: Payer: Self-pay | Admitting: Oncology

## 2023-05-21 ENCOUNTER — Encounter: Payer: Self-pay | Admitting: Physician Assistant

## 2023-05-22 ENCOUNTER — Other Ambulatory Visit: Payer: Medicare HMO

## 2023-05-22 ENCOUNTER — Telehealth: Payer: Self-pay

## 2023-05-22 ENCOUNTER — Ambulatory Visit: Admission: RE | Admit: 2023-05-22 | Payer: Medicare HMO | Source: Ambulatory Visit

## 2023-05-22 ENCOUNTER — Other Ambulatory Visit: Payer: Self-pay

## 2023-05-22 DIAGNOSIS — M7918 Myalgia, other site: Secondary | ICD-10-CM

## 2023-05-22 MED ORDER — PREDNISONE 10 MG PO TABS
ORAL_TABLET | ORAL | 0 refills | Status: DC
Start: 2023-05-22 — End: 2023-06-30

## 2023-05-22 MED ORDER — AZITHROMYCIN 250 MG PO TABS: ORAL_TABLET | ORAL | 0 refills | Status: DC

## 2023-05-22 NOTE — Telephone Encounter (Signed)
Spoke with pt as per lauren advised her that stopped Augmentin and also will send zpak and prednisone due she already over 5 days for paxlovid

## 2023-05-22 NOTE — Telephone Encounter (Signed)
Spoke with pt that stopped Augmentin and as per lauren sent zpak and prednisone

## 2023-05-25 ENCOUNTER — Inpatient Hospital Stay: Payer: Medicare HMO

## 2023-05-25 ENCOUNTER — Inpatient Hospital Stay: Payer: Medicare HMO | Admitting: Oncology

## 2023-06-05 ENCOUNTER — Ambulatory Visit
Admission: RE | Admit: 2023-06-05 | Discharge: 2023-06-05 | Disposition: A | Discharge status: Home / Self Care | Payer: Medicare HMO | Source: Ambulatory Visit | Attending: Oncology | Admitting: Oncology

## 2023-06-05 DIAGNOSIS — C50911 Malignant neoplasm of unspecified site of right female breast: Secondary | ICD-10-CM | POA: Diagnosis not present

## 2023-06-05 DIAGNOSIS — C3490 Malignant neoplasm of unspecified part of unspecified bronchus or lung: Secondary | ICD-10-CM | POA: Diagnosis not present

## 2023-06-05 DIAGNOSIS — C3491 Malignant neoplasm of unspecified part of right bronchus or lung: Secondary | ICD-10-CM | POA: Diagnosis not present

## 2023-06-05 DIAGNOSIS — D3501 Benign neoplasm of right adrenal gland: Secondary | ICD-10-CM | POA: Diagnosis not present

## 2023-06-05 LAB — POCT I-STAT CREATININE: Creatinine, Ser: 0.8 mg/dL (ref 0.44–1.00)

## 2023-06-05 MED ORDER — IOHEXOL 300 MG/ML  SOLN
75.0000 mL | Freq: Once | INTRAMUSCULAR | Status: AC | PRN
  Administered 2023-06-05: 75 mL via INTRAVENOUS

## 2023-06-07 ENCOUNTER — Telehealth: Payer: Self-pay

## 2023-06-07 ENCOUNTER — Other Ambulatory Visit: Payer: Self-pay | Admitting: Internal Medicine

## 2023-06-07 DIAGNOSIS — F411 Generalized anxiety disorder: Secondary | ICD-10-CM

## 2023-06-07 DIAGNOSIS — E876 Hypokalemia: Secondary | ICD-10-CM

## 2023-06-07 NOTE — Telephone Encounter (Addendum)
Called in [facility] alprazolam 0.25 take 1 tab po daily at bedtime #60 with no refills as per dfk

## 2023-06-09 ENCOUNTER — Other Ambulatory Visit: Payer: Self-pay | Admitting: *Deleted

## 2023-06-09 DIAGNOSIS — C3491 Malignant neoplasm of unspecified part of right bronchus or lung: Secondary | ICD-10-CM

## 2023-06-12 ENCOUNTER — Inpatient Hospital Stay: Payer: Medicare HMO | Admitting: Oncology

## 2023-06-12 ENCOUNTER — Inpatient Hospital Stay: Payer: Medicare HMO

## 2023-06-12 ENCOUNTER — Encounter: Payer: Self-pay | Admitting: Oncology

## 2023-06-16 ENCOUNTER — Other Ambulatory Visit: Payer: Medicare HMO

## 2023-06-16 ENCOUNTER — Ambulatory Visit: Payer: Medicare HMO | Admitting: Oncology

## 2023-06-21 ENCOUNTER — Ambulatory Visit: Payer: Medicare HMO | Admitting: Pulmonary Disease

## 2023-06-22 ENCOUNTER — Inpatient Hospital Stay: Payer: Medicare HMO | Attending: Oncology

## 2023-06-22 ENCOUNTER — Ambulatory Visit
Admission: RE | Admit: 2023-06-22 | Discharge: 2023-06-22 | Disposition: A | Discharge status: Home / Self Care | Payer: Medicare HMO | Source: Ambulatory Visit | Attending: Family Medicine | Admitting: Family Medicine

## 2023-06-22 ENCOUNTER — Encounter: Payer: Self-pay | Admitting: Oncology

## 2023-06-22 ENCOUNTER — Encounter: Payer: Self-pay | Admitting: Pulmonary Disease

## 2023-06-22 ENCOUNTER — Telehealth: Payer: Self-pay | Admitting: Pulmonary Disease

## 2023-06-22 ENCOUNTER — Inpatient Hospital Stay (HOSPITAL_BASED_OUTPATIENT_CLINIC_OR_DEPARTMENT_OTHER): Payer: Medicare HMO | Admitting: Oncology

## 2023-06-22 ENCOUNTER — Telehealth: Payer: Self-pay

## 2023-06-22 ENCOUNTER — Ambulatory Visit: Payer: Medicare HMO | Admitting: Pulmonary Disease

## 2023-06-22 VITALS — BP 124/82 | HR 78 | Temp 98.0°F | Ht 60.0 in | Wt 133.6 lb

## 2023-06-22 VITALS — BP 137/69 | HR 76 | Temp 97.6°F | Resp 16 | Ht 60.0 in | Wt 132.8 lb

## 2023-06-22 DIAGNOSIS — R911 Solitary pulmonary nodule: Secondary | ICD-10-CM

## 2023-06-22 DIAGNOSIS — C3490 Malignant neoplasm of unspecified part of unspecified bronchus or lung: Secondary | ICD-10-CM

## 2023-06-22 DIAGNOSIS — D0511 Intraductal carcinoma in situ of right breast: Secondary | ICD-10-CM | POA: Diagnosis not present

## 2023-06-22 DIAGNOSIS — C3491 Malignant neoplasm of unspecified part of right bronchus or lung: Secondary | ICD-10-CM

## 2023-06-22 DIAGNOSIS — Z923 Personal history of irradiation: Secondary | ICD-10-CM | POA: Insufficient documentation

## 2023-06-22 DIAGNOSIS — M5412 Radiculopathy, cervical region: Secondary | ICD-10-CM

## 2023-06-22 DIAGNOSIS — C3411 Malignant neoplasm of upper lobe, right bronchus or lung: Secondary | ICD-10-CM | POA: Diagnosis not present

## 2023-06-22 DIAGNOSIS — Z23 Encounter for immunization: Secondary | ICD-10-CM | POA: Diagnosis not present

## 2023-06-22 DIAGNOSIS — Z87891 Personal history of nicotine dependence: Secondary | ICD-10-CM | POA: Insufficient documentation

## 2023-06-22 DIAGNOSIS — Z9221 Personal history of antineoplastic chemotherapy: Secondary | ICD-10-CM | POA: Diagnosis not present

## 2023-06-22 DIAGNOSIS — M8580 Other specified disorders of bone density and structure, unspecified site: Secondary | ICD-10-CM | POA: Diagnosis not present

## 2023-06-22 DIAGNOSIS — Z981 Arthrodesis status: Secondary | ICD-10-CM | POA: Diagnosis not present

## 2023-06-22 DIAGNOSIS — Z79811 Long term (current) use of aromatase inhibitors: Secondary | ICD-10-CM | POA: Insufficient documentation

## 2023-06-22 LAB — CBC WITH DIFFERENTIAL/PLATELET
Abs Immature Granulocytes: 0.03 10*3/uL (ref 0.00–0.07)
Basophils Absolute: 0.1 10*3/uL (ref 0.0–0.1)
Basophils Relative: 1 %
Eosinophils Absolute: 0.2 10*3/uL (ref 0.0–0.5)
Eosinophils Relative: 2 %
HCT: 38.3 % (ref 36.0–46.0)
Hemoglobin: 12.1 g/dL (ref 12.0–15.0)
Immature Granulocytes: 0 %
Lymphocytes Relative: 23 %
Lymphs Abs: 1.7 10*3/uL (ref 0.7–4.0)
MCH: 30.6 pg (ref 26.0–34.0)
MCHC: 31.6 g/dL (ref 30.0–36.0)
MCV: 96.7 fL (ref 80.0–100.0)
Monocytes Absolute: 0.7 10*3/uL (ref 0.1–1.0)
Monocytes Relative: 10 %
Neutro Abs: 4.7 10*3/uL (ref 1.7–7.7)
Neutrophils Relative %: 64 %
Platelets: 205 10*3/uL (ref 150–400)
RBC: 3.96 MIL/uL (ref 3.87–5.11)
RDW: 15.9 % — ABNORMAL HIGH (ref 11.5–15.5)
WBC: 7.4 10*3/uL (ref 4.0–10.5)
nRBC: 0 % (ref 0.0–0.2)

## 2023-06-22 LAB — CMP (CANCER CENTER ONLY)
ALT: 16 U/L (ref 0–44)
AST: 20 U/L (ref 15–41)
Albumin: 3.8 g/dL (ref 3.5–5.0)
Alkaline Phosphatase: 76 U/L (ref 38–126)
Anion gap: 6 (ref 5–15)
BUN: 15 mg/dL (ref 8–23)
CO2: 27 mmol/L (ref 22–32)
Calcium: 8.9 mg/dL (ref 8.9–10.3)
Chloride: 106 mmol/L (ref 98–111)
Creatinine: 0.79 mg/dL (ref 0.44–1.00)
GFR, Estimated: >60 mL/min (ref >60)
Glucose, Bld: 116 mg/dL — ABNORMAL HIGH (ref 70–99)
Potassium: 4.9 mmol/L (ref 3.5–5.1)
Sodium: 139 mmol/L (ref 135–145)
Total Bilirubin: 0.3 mg/dL (ref 0.3–1.2)
Total Protein: 7 g/dL (ref 6.5–8.1)

## 2023-06-22 MED ORDER — AZITHROMYCIN 250 MG PO TABS: ORAL_TABLET | ORAL | 0 refills | Status: AC

## 2023-06-22 NOTE — Telephone Encounter (Signed)
Prescription has been sent.  She received an AVS after her visit, it is quite lengthy.

## 2023-06-22 NOTE — Telephone Encounter (Signed)
I have notified the patient. Nothing further needed. 

## 2023-06-22 NOTE — Progress Notes (Signed)
States she does not feel well. She had COVID the beginning of September and has not felt good since. No energy and very exhausted.

## 2023-06-22 NOTE — Telephone Encounter (Signed)
Flexible Bronchoscopy with EBUS 10/25 at 9:00am Collapsed right middle lobe [id number], [id number], [id number]  Synetta Fail please see bronch info.

## 2023-06-22 NOTE — Telephone Encounter (Signed)
PT states she was in today at 2:30. I show that appt was cancelled in Epic. I did not see an AVS for today.  Pt states Dr. Reece Agar was going to send in some "antibx that are non-inflammatory" Maybe two Rx's... maybe one,. She is not sure and I saw nothing sent in on her med list for today/.  They were not waiting for her at the pharm.  Pharm is [facility] in [place]   Please transmit. Thanks.

## 2023-06-22 NOTE — Progress Notes (Signed)
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  INTERVAL HISTORY: Patient returns to clinic today for repeat laboratory work, further evaluation, and discussion of her imaging results.  Patient reports she had COVID approximately 4 weeks ago and continues to have some residual shortness of breath and cough.  She otherwise feels well.  She continues to have a mild peripheral neuropathy, but no other neurologic complaints.  She continues to tolerate anastrozole without significant side effects. She denies any recent fevers or illnesses. She has a good appetite and denies weight loss.  She denies any chest pain, shortness of breath, cough, or hemoptysis.  She denies any nausea, vomiting, constipation, or diarrhea.  Patient offers no further specific complaints today.  REVIEW OF SYSTEMS:   Review of Systems  Constitutional: Negative.  Negative for fever, malaise/fatigue and weight loss.  Respiratory:  Positive for cough and shortness of breath. Negative for hemoptysis.   Cardiovascular: Negative.  Negative for chest pain and leg swelling.  Gastrointestinal: Negative.  Negative for abdominal pain and heartburn.  Genitourinary:  Negative for dysuria and frequency.  Musculoskeletal: Negative.  Negative for back pain and myalgias.  Skin: Negative.  Negative for rash.  Neurological:  Positive for sensory change. Negative for dizziness, focal weakness and weakness.  Psychiatric/Behavioral: Negative.  The patient is not  nervous/anxious.     As per HPI. Otherwise, a complete review of systems is negative.  PAST MEDICAL HISTORY: Past Medical History:  Diagnosis Date   Adenoma of right adrenal gland 12/07/2021   a.) CT chest 12/07/2021; measured 2.3 cm; not FDG avid on 01/06/2022 PET CT.   Anxiety    a.) uses BZO (alprazolam) PRN   Aortic atherosclerosis (HCC)    Arthritis    Bladder mass 12/07/2021   a.) CT abd 12/07/2021 --> 9 x 8 mm nodular enhancing focus at the base of the bladder; suspicious for urothelial neoplasm.   COPD (chronic obstructive pulmonary disease) (HCC)    Coronary artery disease 10/16/2009   a.) LHC/PCI 10/16/2009: EF 60%; 50% OM1, 99% dRCA (3.0 x 18 mm Xience V DES). b.) LHC 07/31/2014: EF 65%; 40% pLAD, 20% pLCx, 40% OM1, 30% oRCA, 50% pRCA, 20% mRCA, 10% ISR stent to dRCA; further intervention deferred opting for med. mgmt.   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 b.) s/p lumpectomy, adjuvant XRT; currently on extended AI (anastrozole) therapy   Essential hypertension    GERD (gastroesophageal reflux disease)    HLD (hyperlipidemia)    Left thyroid nodule 12/07/2021   a.) CT chest; measured 2.6 cm   Long term current use of immunosuppressive drug    a.) MTX for RA.   OSA on CPAP    currently not using   Osteopenia    Personal history of radiation therapy    Pneumonia    PRES (posterior reversible encephalopathy syndrome) 07/14/2014   Pulmonary nodules 11/08/2021   a.) CXR 11/08/2021:  18mm nodular density RUL. b.) CT chest 12/07/2021: 1.9 x 1.4 x 2.5 spiculated lesion anterior RUL. c.) PET CT 01/06/2022: 2.2 x 1.9 spiculated anterior RUL mass (SUV max 16.4); d.) Bx (+) NSCLC, favoring SCC with tumor necrosis; p40/CK7/GATA3 (+), TTF-1 (-)   Rheumatoid arthritis (HCC)    a.) on MTX   Seasonal allergies    Seizures (HCC) 2014   a.) x 1  episode; etiology never determined.   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   Tubular adenoma    Valvular heart disease     PAST SURGICAL HISTORY: Past Surgical History:  Procedure Laterality Date   ABDOMINAL HYSTERECTOMY     ANTERIOR CERVICAL DECOMP/DISCECTOMY FUSION N/A 01/20/2014   Procedure: ANTERIOR CERVICAL DECOMPRESSION/DISCECTOMY FUSION 2 LEVELS cervical five/six six Arline Asp;  Surgeon: Cristi Loron, MD;  Location: [facility];  Service: Neurosurgery;  Laterality: N/A;   APPENDECTOMY     BACK SURGERY     lumbar   BLADDER INSTILLATION N/A 05/02/2022   Procedure: BLADDER INSTILLATION OF GEMCITABINE;  Surgeon: Vanna Scotland, MD;  Location: [facility];  Service: Urology;  Laterality: N/A;   BREAST BIOPSY Right 02/01/2018   x shape, DCIS    BREAST BIOPSY  01/16/2019   coil, BENIGN MAMMARY TISSUE WITH MODERATE STROMAL FIBROSIS AND COARSE DYSTROPHIC CALCIFICATIONS of the RIGHT breast   BREAST LUMPECTOMY Right 03/07/2018   Procedure: BREAST LUMPECTOMY/ RE EXCISION;  Surgeon: Carolan Shiver, MD;  Location: [facility];  Service: General;  Laterality: Right;   CARDIAC CATHETERIZATION Left 07/31/2014   Procedure: CARDIAC CATHETERIZATION; Location: [facility]; Surgeon: Arnoldo Hooker, MD   COLONOSCOPY WITH PROPOFOL N/A 02/11/2021   Procedure: COLONOSCOPY WITH PROPOFOL;  Surgeon: Midge Minium, MD;  Location: [facility];  Service: Endoscopy;  Laterality: N/A;  Requests Early   CORONARY ANGIOPLASTY WITH STENT PLACEMENT Left 10/16/2009   Procedure: CORONARY ANGIOPLASTY WITH STENT PLACEMENT; Location: [facility]; Surgeon: Rudean Hitt, MD    CYSTOSCOPY W/ RETROGRADES Bilateral 05/02/2022   Procedure: CYSTOSCOPY WITH RETROGRADE PYELOGRAM;  Surgeon: Vanna Scotland, MD;  Location: [facility];  Service: Urology;  Laterality: Bilateral;   ESOPHAGOGASTRODUODENOSCOPY (EGD) WITH PROPOFOL N/A 10/07/2022   Procedure: ESOPHAGOGASTRODUODENOSCOPY (EGD) WITH PROPOFOL;  Surgeon: Midge Minium, MD;  Location: [facility];  Service: Endoscopy;  Laterality: N/A;  sleep apnea   INTERCOSTAL NERVE BLOCK Right 03/17/2022   Procedure: INTERCOSTAL NERVE BLOCK;  Surgeon: Loreli Slot, MD;  Location: [facility] OR;  Service: Thoracic;  Laterality: Right;   LASIK Bilateral    LUNG LOBECTOMY Right 03/17/2022   right upper lobe   LYMPH NODE DISSECTION Right 03/17/2022   Procedure: LYMPH NODE DISSECTION;  Surgeon: Loreli Slot, MD;  Location: [facility] OR;  Service: Thoracic;  Laterality: Right;   PARTIAL MASTECTOMY WITH NEEDLE LOCALIZATION Right 02/21/2018   Procedure: PARTIAL MASTECTOMY WITH NEEDLE LOCALIZATION;  Surgeon: Carolan Shiver, MD;  Location: [facility];  Service: General;  Laterality: Right;   POLYPECTOMY N/A 02/11/2021   Procedure: POLYPECTOMY;  Surgeon: Midge Minium, MD;  Location: [facility];  Service: Endoscopy;  Laterality: N/A;   ROTATOR CUFF REPAIR Left    SHOULDER ARTHROSCOPY WITH  ROTATOR CUFF REPAIR AND OPEN BICEPS TENODESIS Right 08/07/2019   Procedure: SHOULDER ARTHROSCOPY WITH DEBRIDEMENT, DECOMPRESSION, ROTATOR CUFF REPAIR AND BICEPS TENODESIS. - RNFA;  Surgeon: Christena Flake, MD;  Location: [facility];  Service: Orthopedics;  Laterality: Right;   TOE SURGERY Right    has pin and plate in it   TRANSURETHRAL RESECTION OF BLADDER TUMOR N/A 05/02/2022   Procedure: TRANSURETHRAL RESECTION OF BLADDER TUMOR (TURBT);  Surgeon: Vanna Scotland, MD;  Location: [facility];  Service: Urology;  Laterality: N/A;   VIDEO BRONCHOSCOPY WITH ENDOBRONCHIAL ULTRASOUND N/A 03/02/2022   Procedure: VIDEO BRONCHOSCOPY WITH ENDOBRONCHIAL  ULTRASOUND;  Surgeon: Salena Saner, MD;  Location: [facility];  Service: Cardiopulmonary;  Laterality: N/A;    FAMILY HISTORY: Family History  Problem Relation Age of Onset   Breast cancer Cousin 57       bilateral at 13 and 28; daughter of maternal aunt who was unaffected   Lung cancer Maternal Aunt        dx 8s; deceased 34s; smoker   Heart attack Father        deceased 56   Stomach cancer Maternal Grandmother     ADVANCED DIRECTIVES (Y/N):  N  HEALTH MAINTENANCE: Social History   Tobacco Use   Smoking status: Former    Current packs/day: 0.00    Average packs/day: 1 pack/day for 30.0 years (30.0 ttl pk-yrs)    Types: Cigarettes    Start date: 09/12/1985    Quit date: 09/13/2015    Years since quitting: 7.7   Smokeless tobacco: Never  Vaping Use   Vaping status: Never Used  Substance Use Topics   Alcohol use: Yes    Comment: occasional   Drug use: No     Colonoscopy:  PAP:  Bone density:  Lipid panel:  Allergies  Allergen Reactions   Other     BANDAIDS-OF LEFT ON FOR AN EXTENDED PERIOD OF TIME  Patient states latex rubber gloves are ok    Current Outpatient Medications  Medication Sig Dispense Refill   albuterol (PROVENTIL) (2.5 MG/3ML) 0.083% nebulizer solution Take 3 mLs (2.5 mg total) by nebulization every 6 (six) hours as needed for wheezing or shortness of breath. 125 mL 2   albuterol (VENTOLIN HFA) 108 (90 Base) MCG/ACT inhaler Inhale 1-2 puffs into the lungs every 6 (six) hours as needed for wheezing or shortness of breath. 1 g 3   alendronate (FOSAMAX) 70 MG tablet TAKE 1 TABLET BY MOUTH ONCE A WEEK. TAKE WITH A FULL GLASS OF WATER ON AN EMPTY STOMACH. STAND OR SIT UPRIGHT FOR 1 HOUR AFTER TAKING 12 tablet 1   ALPRAZolam (XANAX) 0.25 MG tablet TAKE 1-2 TABLETS BY MOUTH DAILY AT BEDTIME AS NEEDED FOR ANXIETY 60 tablet 0   aspirin-acetaminophen-caffeine (EXCEDRIN MIGRAINE) 250-250-65 MG tablet Take by mouth every 6 (six) hours as needed for headache.      fluticasone (FLONASE) 50 MCG/ACT nasal spray Place 2 sprays into both nostrils daily as needed for allergies. 60 mL 1   fluticasone-salmeterol (ADVAIR) 250-50 MCG/ACT AEPB INHALE 1 PUFF TWICE DAILY IN THE MORNING AND AT BEDTIME 180 each 3   folic acid (FOLVITE) 1 MG tablet Take 2 mg by mouth in the morning.     gabapentin (NEURONTIN) 300 MG capsule Take 1 capsule (300 mg total) by mouth 2 (two) times daily. 60 capsule 6   methotrexate (RHEUMATREX) 2.5 MG tablet Take 20 mg by mouth once a week. WEDNESDAY     montelukast (SINGULAIR) 10  MG tablet TAKE (1) TABLET BY MOUTH  AT BEDTIME 90 tablet 1   omeprazole (PRILOSEC) 20 MG capsule Take 20 mg by mouth daily.     potassium chloride (KLOR-CON) 10 MEQ tablet TAKE (1) TABLET BY MOUTH EVERY DAY 90 tablet 1   rosuvastatin (CRESTOR) 40 MG tablet Take 40 mg by mouth every evening.     sertraline (ZOLOFT) 100 MG tablet TAKE (1) TABLET BY MOUTH EVERY DAY 90 tablet 1   traMADol (ULTRAM) 50 MG tablet Take 1-2 tablets (50-100 mg total) by mouth 3 (three) times daily as needed. 60 tablet 0   amoxicillin-clavulanate (AUGMENTIN) 875-125 MG tablet Take 1 tablet by mouth 2 (two) times daily. Take with food. (Patient not taking: Reported on 06/22/2023) 20 tablet 0   anastrozole (ARIMIDEX) 1 MG tablet Take 1 tablet (1 mg total) by mouth at bedtime. (Patient not taking: Reported on 06/22/2023) 30 tablet 10   predniSONE (DELTASONE) 10 MG tablet Take 1 tab po 3 x day for 3 days then take 1 tab po 2 x a day for 3 days and then take 1 tab po daily for 3 days (Patient not taking: Reported on 06/22/2023) 18 tablet 0   No current facility-administered medications for this visit.    OBJECTIVE: Vitals:   06/22/23 0836  BP: 137/69  Pulse: 76  Resp: 16  Temp: 97.6 F (36.4 C)  SpO2: 98%      Body mass index is 25.94 kg/m.    ECOG FS:0 - Asymptomatic  General: Well-developed, well-nourished, no acute distress. Eyes: Pink conjunctiva, anicteric sclera. HEENT:  Normocephalic, moist mucous membranes. Lungs: No audible wheezing or coughing. Heart: Regular rate and rhythm. Abdomen: Soft, nontender, no obvious distention. Musculoskeletal: No edema, cyanosis, or clubbing. Neuro: Alert, answering all questions appropriately. Cranial nerves grossly intact. Skin: No rashes or petechiae noted. Psych: Normal affect.  LAB RESULTS:  Lab Results  Component Value Date   NA 139 06/22/2023   K 4.9 06/22/2023   CL 106 06/22/2023   CO2 27 06/22/2023   GLUCOSE 116 (H) 06/22/2023   BUN 15 06/22/2023   CREATININE 0.79 06/22/2023   CALCIUM 8.9 06/22/2023   PROT 7.0 06/22/2023   ALBUMIN 3.8 06/22/2023   AST 20 06/22/2023   ALT 16 06/22/2023   ALKPHOS 76 06/22/2023   BILITOT 0.3 06/22/2023   GFRNONAA >60 06/22/2023   GFRAA >60 04/09/2020    Lab Results  Component Value Date   WBC 7.4 06/22/2023   NEUTROABS 4.7 06/22/2023   HGB 12.1 06/22/2023   HCT 38.3 06/22/2023   MCV 96.7 06/22/2023   PLT 205 06/22/2023     STUDIES: CT Chest W Contrast  Result Date: 06/16/2023 CLINICAL DATA:  Right breast cancer, lung cancer. * Tracking Code: BO * EXAM: CT CHEST WITH CONTRAST TECHNIQUE: Multidetector CT imaging of the chest was performed during intravenous contrast administration. RADIATION DOSE REDUCTION: This exam was performed according to the departmental dose-optimization program which includes automated exposure control, adjustment of the mA and/or kV according to patient size and/or use of iterative reconstruction technique. CONTRAST:  75mL OMNIPAQUE IOHEXOL 300 MG/ML  SOLN COMPARISON:  11/08/2022. FINDINGS: Cardiovascular: Atherosclerotic calcification of the aorta, aortic valve and coronary arteries. Heart is enlarged. Left ventricle appears somewhat dilated. No pericardial effusion. Mediastinum/Nodes: Hypoattenuating left thyroid nodule measures 2.5 cm. Mediastinal and hilar lymph nodes are not enlarged by CT size criteria. No axillary adenopathy. Fluid  collection in the lateral right breast measures 3.7 x 6.9 cm, stable and  likely a seroma. Degenerative changes in the spine. Lungs/Pleura: Centrilobular and paraseptal emphysema. Right upper lobectomy. Increasing volume loss in the right middle lobe with obstruction of the right middle lobe bronchus, new from 11/08/2022. Trace loculated pleural fluid in the anterior upper right hemithorax. Airway is otherwise unremarkable. Upper Abdomen: Visualized portions of the liver and gallbladder are unremarkable. Right adrenal nodule measures 2.5 cm and 17 Hounsfield units. Lesion was -15 Hounsfield units on PET 01/06/2022. No specific follow-up necessary. Mild thickening of the left adrenal gland. No specific follow-up necessary. Visualized portion of the right kidney is unremarkable. Scarring in the left kidney. Visualized portions of the spleen, pancreas, stomach and bowel are grossly unremarkable. No upper abdominal adenopathy. Musculoskeletal: Degenerative changes in the spine. Old right thoracotomies. No worrisome lytic or sclerotic lesions. IMPRESSION: 1. New obstruction of the right middle lobe bronchus with increasing volume loss in the right middle lobe. Please correlate clinically. Centrally obstructing lesion cannot be definitively excluded. 2. Hypoattenuating left thyroid nodule. Recommend thyroid ultrasound. (Ref: J Am Coll Radiol. 2015 Feb;12(2): 143-50). 3. Right adrenal adenoma. 4. Aortic atherosclerosis (ICD10-I70.0). Coronary artery calcification. 5.  Emphysema (ICD10-J43.9). Electronically Signed   By: Leanna Battles M.D.   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  PLAN:    Stage IIIa squamous cell carcinoma of the right upper lung: Final pathology results from her right upper lobectomy on March 17, 2022 revealed a 4.1 cm lesion that was adherent to the chest wall, but not invading.  She was also noted to  have 1 of 33 lymph nodes positive for disease.  This increased her to a stage IIIa. MRI of the brain on April 21, 2022 reviewed independently with no obvious evidence of metastatic disease. Patient completed 4 cycles of adjuvant chemotherapy using cisplatin and Taxotere on June 22, 2022.  Her most recent imaging on June 16, 2023 reviewed independently and reported as above with new obstructing right middle lobe bronchus lesion concerning for recurrence.  Although her recent COVID infection could be contributing.  Patient has an appointment with pulmonology later today to discuss possible bronchoscopy.  If bronchoscopy is concerning for malignancy, will order PET scan for further evaluation.  Follow-up will be based on bronchoscopy results.   DCIS, right breast: Patient underwent lumpectomy followed by adjuvant XRT completing in September 2019.  Because there was no invasive component on her pathology, she did not require adjuvant chemotherapy. Patient could not tolerate tamoxifen or letrozole secondary to worsening joint pain, therefore was switched to anastrozole.  Patient completed 5 years of treatment in September 2024.  Her most recent mammogram on March 21, 2023 was reported as BI-RADS 1.  Repeat in July 2025. Osteopenia: Patient's most recent bone mineral density on March 21, 2023 reported T-score of -2.5 which continues to trend down over the past several years.  Continue alendronate, but consider switching to Prolia in the near future.  Patient has also been instructed to continue calcium and vitamin D supplementation.  Repeat bone mineral density in July 2025.   Reflux/abdominal pain: Patient does not complain of this today.  Continue omeprazole.   Anemia: Resolved.    Jeralyn Ruths, MD 06/22/2023 9:41 AM   Cancer Staging  Ductal carcinoma in situ (DCIS) of right breast Staging form: Breast, AJCC 8th Edition - Clinical: Stage 0 (cTis (DCIS), cN0, cM0, ER+, PR-, HER2-) - Signed by  Orlie Dakin,  Tollie Pizza, MD on 03/18/2018 Nuclear grade: G3 Laterality: Right  Squamous cell carcinoma of right lung [facility]) Staging form: Lung, AJCC 8th Edition - Pathologic stage from 04/07/2022: Stage IIIA (pT3, pN1, cM0) - Signed by Jeralyn Ruths, MD on 04/07/2022

## 2023-06-22 NOTE — Progress Notes (Signed)
Subjective:    Patient ID: Michelle Moore, female    DOB: 02/02/1956, 67 y.o.   MRN: [id number]  Patient Care Team: Alan Ripper as PCP - General (Physician Assistant) Glory Buff, RN as Oncology Nurse Navigator Orlie Dakin, Tollie Pizza, MD as Consulting Physician (Oncology) Salena Saner, MD as Consulting Physician (Pulmonary Disease)  Chief Complaint  Patient presents with   Follow-up    DOE. No wheezing. Cough with clear sputum.    HPI The patient is a 67 year old former smoker with a 30-pack-year history of smoking who presents for follow-up COPD and shortness of breath.  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  Endobronchial ultrasound in June 2023 was negative and the patient was then referred to thoracic surgery.  The patient underwent lobectomy by Dr. Dorris Fetch on 17 March 2022 she had 1 out of 18 nodes positive for carcinoma.  Her staging was then modified to 3A.  She underwent 4 cycles of adjuvant cisplatin and Taxotere which she completed on October 2023.  She is being followed very closely by oncology (Dr. Orlie Dakin).  She had surveillance CT chest with contrast on 05 June 2023, this showed potentially no obstruction of the right middle lobe bronchus with increasing volume loss in the right middle lobe, centrally obstructing lesion versus mucus impaction cannot be excluded.  The patient does relate that she had COVID approximately 4 weeks ago and had a "rough time" of it with increasing congestion on the chest.  Since that time she has not had any fevers, chills or sweats.  She had been expectorating significant amounts of white to gray sputum since her COVID that this has also improved.  She was evaluated by Dr. Orlie Dakin today.  As noted she has been referred for  consideration of repeat bronchoscopy.   She states overall she is feeling better after her COVID-19 episode.  She has some dyspnea on exertion but no wheezing.  She has not had any orthopnea or paroxysmal nocturnal dyspnea.  No chest pain.  Of note she had sleep study on 25 October 2022 and this showed very mild sleep apnea with AHI of only 5.4.  Oral appliance was recommended however the patient wants to hold off on this.  Desires to get flu vaccine today.  I reviewed the films independently with the patient and compared the films to her prior CT.  The patient has clear understanding of the reason for bronchoscopy.  Procedure proposes diagnostic bronchoscopy with biopsies as deemed necessary and endobronchial ultrasound for restaging of the mediastinum.  Patient understands the potential benefits, limitations and complications of the procedure.  Review of Systems A 10 point review of systems was performed and it is as noted above otherwise negative.   Patient Active Problem List   Diagnosis Date Noted   Dysphagia 10/07/2022   Abdominal pain, epigastric 10/07/2022   S/P lobectomy of lung 03/17/2022   S/P robot-assisted surgical procedure 03/17/2022   Squamous cell carcinoma of right lung (HCC) 02/15/2022   S/P cardiac catheterization 04/06/2021   Osteoporosis without current pathological fracture 04/06/2021   Aromatase inhibitor use 04/06/2021   Special screening for malignant neoplasms, colon    Family history of colon cancer    Polyp of descending colon    Cough 08/28/2020   Hypokalemia 07/25/2020  Urinary tract infection without hematuria 07/25/2020   Recurrent displacement of lumbar disc 04/08/2020   Essential hypertension 01/22/2020   Degenerative tear of glenoid labrum of right shoulder 07/19/2019   Nontraumatic complete tear of right rotator cuff 07/19/2019   Rotator cuff tendinitis, right 07/19/2019   Tendinitis of upper biceps tendon of right shoulder 07/19/2019    Encounter for general adult medical examination with abnormal findings 05/29/2019   Dysuria 05/29/2019   Acute non-recurrent pansinusitis 06/01/2018   Perennial allergic rhinitis 06/01/2018   Generalized anxiety disorder 06/01/2018   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   Coronary artery disease 11/09/2017   Mixed hyperlipidemia 11/09/2017   Bilateral carotid artery stenosis 08/14/2014   Gastroesophageal reflux disease with esophagitis 08/14/2014   OSA on CPAP 07/14/2014   Weight loss of more than 10% body weight 07/14/2014   Cervical spondylosis with radiculopathy 01/20/2014   Cervical spondylosis without myelopathy 01/20/2014    Social History   Tobacco Use   Smoking status: Former    Current packs/day: 0.00    Average packs/day: 1 pack/day for 30.0 years (30.0 ttl pk-yrs)    Types: Cigarettes    Start date: 09/12/1985    Quit date: 09/13/2015    Years since quitting: 7.7   Smokeless tobacco: Never  Substance Use Topics   Alcohol use: Yes    Comment: occasional    Allergies  Allergen Reactions   Other     BANDAIDS-OF LEFT ON FOR AN EXTENDED PERIOD OF TIME  Patient states latex rubber gloves are ok    Current Meds  Medication Sig   albuterol (PROVENTIL) (2.5 MG/3ML) 0.083% nebulizer solution Take 3 mLs (2.5 mg total) by nebulization every 6 (six) hours as needed for wheezing or shortness of breath.   albuterol (VENTOLIN HFA) 108 (90 Base) MCG/ACT inhaler Inhale 1-2 puffs into the lungs every 6 (six) hours as needed for wheezing or shortness of breath.   alendronate (FOSAMAX) 70 MG tablet TAKE 1 TABLET BY MOUTH ONCE A WEEK. TAKE WITH A FULL GLASS OF WATER ON AN EMPTY STOMACH. STAND OR SIT UPRIGHT FOR 1 HOUR AFTER TAKING   ALPRAZolam (XANAX) 0.25 MG tablet TAKE 1-2 TABLETS BY MOUTH DAILY AT BEDTIME AS NEEDED FOR ANXIETY   aspirin-acetaminophen-caffeine (EXCEDRIN MIGRAINE) 250-250-65 MG tablet Take by mouth every 6 (six) hours as needed for headache.    azithromycin (ZITHROMAX) 250 MG tablet Take 2 tablets (500 mg) on  Day 1,  followed by 1 tablet (250 mg) once daily on Days 2 through 5.   fluticasone (FLONASE) 50 MCG/ACT nasal spray Place 2 sprays into both nostrils daily as needed for allergies.   fluticasone-salmeterol (ADVAIR) 250-50 MCG/ACT AEPB INHALE 1 PUFF TWICE DAILY IN THE MORNING AND AT BEDTIME   folic acid (FOLVITE) 1 MG tablet Take 2 mg by mouth in the morning.   gabapentin (NEURONTIN) 300 MG capsule Take 1 capsule (300 mg total) by mouth 2 (two) times daily.   methotrexate (RHEUMATREX) 2.5 MG tablet Take 20 mg by mouth once a week. WEDNESDAY   montelukast (SINGULAIR) 10 MG tablet TAKE (1) TABLET BY MOUTH  AT BEDTIME   omeprazole (PRILOSEC) 20 MG capsule Take 20 mg by mouth daily.   potassium chloride (KLOR-CON) 10 MEQ tablet TAKE (1) TABLET BY MOUTH EVERY DAY   rosuvastatin (CRESTOR) 40 MG tablet Take 40 mg by mouth every evening.   sertraline (ZOLOFT) 100 MG tablet TAKE (1) TABLET BY MOUTH EVERY DAY   traMADol (ULTRAM)  50 MG tablet Take 1-2 tablets (50-100 mg total) by mouth 3 (three) times daily as needed.    Immunization History  Administered Date(s) Administered   Fluad Trivalent(High Dose 65+) 06/22/2023   Influenza Inj Mdck Quad Pf 04/24/2020, 06/03/2021, 07/25/2022   Influenza,inj,Quad PF,6+ Mos 08/02/2018   Influenza,inj,quad, With Preservative 06/17/2019   Influenza-Unspecified 06/17/2019   Moderna SARS-COV2 Booster Vaccination 08/04/2020   Moderna Sars-Covid-2 Vaccination 11/13/2019, 12/11/2019   Pneumococcal Conjugate-13 06/17/2019   Pneumococcal Polysaccharide-23 06/17/2019        Objective:     BP 124/82 (BP Location: Right Arm, Cuff Size: Normal)   Pulse 78   Temp 98 F (36.7 C)   Ht 5' (1.524 m)   Wt 133 lb 9.6 oz (60.6 kg)   SpO2 96%   BMI 26.09 kg/m   SpO2: 96 % O2 Device: None (Room air)  GENERAL: Well-developed, well-nourished woman, no acute distress. Fully ambulatory.  No conversational  dyspnea. HEAD: Normocephalic, atraumatic.  EYES: Pupils equal, round, reactive to light.  No scleral icterus.  MOUTH: Oral mucosa moist.  No thrush. NECK: Supple. No thyromegaly. Trachea midline. No JVD.  No adenopathy. PULMONARY: Good air entry bilaterally.  No adventitious sounds. CARDIOVASCULAR: S1 and S2. Regular rate and rhythm.  No rubs, murmurs or gallops heard. ABDOMEN: Benign. MUSCULOSKELETAL: No joint deformity, no clubbing, no edema.  NEUROLOGIC: No overt focal deficit, no gait disturbance, speech is fluent. SKIN: Intact,warm,dry. PSYCH: Normal mood, normal behavior.    Assessment & Plan:     ICD-10-CM   1. Squamous cell carcinoma of right lung (HCC)  C34.91     2. Lesion of right lung  R91.1     3. Flu vaccine need  Z23 Flu Vaccine Trivalent High Dose (Fluad)      Orders Placed This Encounter  Procedures   Flu Vaccine Trivalent High Dose (Fluad)    Meds ordered this encounter  Medications   azithromycin (ZITHROMAX) 250 MG tablet    Sig: Take 2 tablets (500 mg) on  Day 1,  followed by 1 tablet (250 mg) once daily on Days 2 through 5.    Dispense:  6 each    Refill:  0   Benefits, limitations and potential complications of the procedure were discussed with the patient/family.  Complications from bronchoscopy are rare and most often minor, but if they occur they may include breathing difficulty, vocal cord spasm, hoarseness, slight fever, vomiting, dizziness, bronchospasm, infection, low blood oxygen, bleeding from biopsy site, or an allergic reaction to medications.  It is uncommon for patients to experience other more serious complications for example: Collapsed lung requiring chest tube placement, respiratory failure, heart attack and/or cardiac arrhythmia.  Patient agrees to proceed.   Patient will have diagnostic bronchoscopy with endobronchial ultrasound and biopsies as needed.  This has been scheduled for 25 October at 9 AM.  Patient will be seen in follow-up in  4 to 6 weeks time.  She is to contact us prior to that time should any new difficulties arise.  Gailen Shelter, MD Advanced Bronchoscopy [facility]    *This note was dictated using voice recognition software/Dragon.  Despite best efforts to proofread, errors can occur which can change the meaning. Any transcriptional errors that result from this process are unintentional and may not be fully corrected at the time of dictation.

## 2023-06-22 NOTE — Patient Instructions (Signed)
We are tentatively scheduling your procedure for 25 October at 9 AM.  Nothing to drink or eat from midnight the night prior.  Avoid Excedrin for 3 to 5 days prior to procedure.  You will get a call from the preadmission clinic and they will give you instructions prior to the procedure via telephone call.  They may request that you come in for a blood draw but they will inform you if that is the case.  This has to do with the requirements from the anesthesiologist.  We discussed that the procedure would have to be done under general anesthesia. The anesthesia team will discuss his part of the process with you.  Complications from the procedure itself are usually minor. One potential complication would be collapse of the lung which can occur in 2-3% of the cases. If  this happens we would have to put a small tube to relieve the collapse and you would have to spend the night in the hospital in that event.  I do not expect this to be an occurrence of concern in this particular case given where the issues are located.  Another possibility would be that of bleeding, this is usually taken care of during the procedure.  In these situations you may need to be observed overnight.  For the most part though, you should be able to go home the same day.  Another possibility would be that the procedure would be nondiagnostic meaning that no definitive diagnosis could be attained.  We strive to decrease these potential issues.  You received your flu vaccine today.  I sent a prescription for a short course of antibiotic for you.  We will see you in follow-up in 4 to 6 weeks time call sooner should any new problems arise.

## 2023-06-22 NOTE — H&P (View-Only) (Signed)
Subjective:    Patient ID: Michelle Moore, female    DOB: 02/02/1956, 67 y.o.   MRN: [id number]  Patient Care Team: Alan Ripper as PCP - General (Physician Assistant) Glory Buff, RN as Oncology Nurse Navigator Orlie Dakin, Tollie Pizza, MD as Consulting Physician (Oncology) Salena Saner, MD as Consulting Physician (Pulmonary Disease)  Chief Complaint  Patient presents with   Follow-up    DOE. No wheezing. Cough with clear sputum.    HPI The patient is a 67 year old former smoker with a 30-pack-year history of smoking who presents for follow-up COPD and shortness of breath.  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  Endobronchial ultrasound in June 2023 was negative and the patient was then referred to thoracic surgery.  The patient underwent lobectomy by Dr. Dorris Fetch on 17 March 2022 she had 1 out of 18 nodes positive for carcinoma.  Her staging was then modified to 3A.  She underwent 4 cycles of adjuvant cisplatin and Taxotere which she completed on October 2023.  She is being followed very closely by oncology (Dr. Orlie Dakin).  She had surveillance CT chest with contrast on 05 June 2023, this showed potentially no obstruction of the right middle lobe bronchus with increasing volume loss in the right middle lobe, centrally obstructing lesion versus mucus impaction cannot be excluded.  The patient does relate that she had COVID approximately 4 weeks ago and had a "rough time" of it with increasing congestion on the chest.  Since that time she has not had any fevers, chills or sweats.  She had been expectorating significant amounts of white to gray sputum since her COVID that this has also improved.  She was evaluated by Dr. Orlie Dakin today.  As noted she has been referred for  consideration of repeat bronchoscopy.   She states overall she is feeling better after her COVID-19 episode.  She has some dyspnea on exertion but no wheezing.  She has not had any orthopnea or paroxysmal nocturnal dyspnea.  No chest pain.  Of note she had sleep study on 25 October 2022 and this showed very mild sleep apnea with AHI of only 5.4.  Oral appliance was recommended however the patient wants to hold off on this.  Desires to get flu vaccine today.  I reviewed the films independently with the patient and compared the films to her prior CT.  The patient has clear understanding of the reason for bronchoscopy.  Procedure proposes diagnostic bronchoscopy with biopsies as deemed necessary and endobronchial ultrasound for restaging of the mediastinum.  Patient understands the potential benefits, limitations and complications of the procedure.  Review of Systems A 10 point review of systems was performed and it is as noted above otherwise negative.   Patient Active Problem List   Diagnosis Date Noted   Dysphagia 10/07/2022   Abdominal pain, epigastric 10/07/2022   S/P lobectomy of lung 03/17/2022   S/P robot-assisted surgical procedure 03/17/2022   Squamous cell carcinoma of right lung (HCC) 02/15/2022   S/P cardiac catheterization 04/06/2021   Osteoporosis without current pathological fracture 04/06/2021   Aromatase inhibitor use 04/06/2021   Special screening for malignant neoplasms, colon    Family history of colon cancer    Polyp of descending colon    Cough 08/28/2020   Hypokalemia 07/25/2020  Urinary tract infection without hematuria 07/25/2020   Recurrent displacement of lumbar disc 04/08/2020   Essential hypertension 01/22/2020   Degenerative tear of glenoid labrum of right shoulder 07/19/2019   Nontraumatic complete tear of right rotator cuff 07/19/2019   Rotator cuff tendinitis, right 07/19/2019   Tendinitis of upper biceps tendon of right shoulder 07/19/2019    Encounter for general adult medical examination with abnormal findings 05/29/2019   Dysuria 05/29/2019   Acute non-recurrent pansinusitis 06/01/2018   Perennial allergic rhinitis 06/01/2018   Generalized anxiety disorder 06/01/2018   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   Coronary artery disease 11/09/2017   Mixed hyperlipidemia 11/09/2017   Bilateral carotid artery stenosis 08/14/2014   Gastroesophageal reflux disease with esophagitis 08/14/2014   OSA on CPAP 07/14/2014   Weight loss of more than 10% body weight 07/14/2014   Cervical spondylosis with radiculopathy 01/20/2014   Cervical spondylosis without myelopathy 01/20/2014    Social History   Tobacco Use   Smoking status: Former    Current packs/day: 0.00    Average packs/day: 1 pack/day for 30.0 years (30.0 ttl pk-yrs)    Types: Cigarettes    Start date: 09/12/1985    Quit date: 09/13/2015    Years since quitting: 7.7   Smokeless tobacco: Never  Substance Use Topics   Alcohol use: Yes    Comment: occasional    Allergies  Allergen Reactions   Other     BANDAIDS-OF LEFT ON FOR AN EXTENDED PERIOD OF TIME  Patient states latex rubber gloves are ok    Current Meds  Medication Sig   albuterol (PROVENTIL) (2.5 MG/3ML) 0.083% nebulizer solution Take 3 mLs (2.5 mg total) by nebulization every 6 (six) hours as needed for wheezing or shortness of breath.   albuterol (VENTOLIN HFA) 108 (90 Base) MCG/ACT inhaler Inhale 1-2 puffs into the lungs every 6 (six) hours as needed for wheezing or shortness of breath.   alendronate (FOSAMAX) 70 MG tablet TAKE 1 TABLET BY MOUTH ONCE A WEEK. TAKE WITH A FULL GLASS OF WATER ON AN EMPTY STOMACH. STAND OR SIT UPRIGHT FOR 1 HOUR AFTER TAKING   ALPRAZolam (XANAX) 0.25 MG tablet TAKE 1-2 TABLETS BY MOUTH DAILY AT BEDTIME AS NEEDED FOR ANXIETY   aspirin-acetaminophen-caffeine (EXCEDRIN MIGRAINE) 250-250-65 MG tablet Take by mouth every 6 (six) hours as needed for headache.    azithromycin (ZITHROMAX) 250 MG tablet Take 2 tablets (500 mg) on  Day 1,  followed by 1 tablet (250 mg) once daily on Days 2 through 5.   fluticasone (FLONASE) 50 MCG/ACT nasal spray Place 2 sprays into both nostrils daily as needed for allergies.   fluticasone-salmeterol (ADVAIR) 250-50 MCG/ACT AEPB INHALE 1 PUFF TWICE DAILY IN THE MORNING AND AT BEDTIME   folic acid (FOLVITE) 1 MG tablet Take 2 mg by mouth in the morning.   gabapentin (NEURONTIN) 300 MG capsule Take 1 capsule (300 mg total) by mouth 2 (two) times daily.   methotrexate (RHEUMATREX) 2.5 MG tablet Take 20 mg by mouth once a week. WEDNESDAY   montelukast (SINGULAIR) 10 MG tablet TAKE (1) TABLET BY MOUTH  AT BEDTIME   omeprazole (PRILOSEC) 20 MG capsule Take 20 mg by mouth daily.   potassium chloride (KLOR-CON) 10 MEQ tablet TAKE (1) TABLET BY MOUTH EVERY DAY   rosuvastatin (CRESTOR) 40 MG tablet Take 40 mg by mouth every evening.   sertraline (ZOLOFT) 100 MG tablet TAKE (1) TABLET BY MOUTH EVERY DAY   traMADol (ULTRAM)  50 MG tablet Take 1-2 tablets (50-100 mg total) by mouth 3 (three) times daily as needed.    Immunization History  Administered Date(s) Administered   Fluad Trivalent(High Dose 65+) 06/22/2023   Influenza Inj Mdck Quad Pf 04/24/2020, 06/03/2021, 07/25/2022   Influenza,inj,Quad PF,6+ Mos 08/02/2018   Influenza,inj,quad, With Preservative 06/17/2019   Influenza-Unspecified 06/17/2019   Moderna SARS-COV2 Booster Vaccination 08/04/2020   Moderna Sars-Covid-2 Vaccination 11/13/2019, 12/11/2019   Pneumococcal Conjugate-13 06/17/2019   Pneumococcal Polysaccharide-23 06/17/2019        Objective:     BP 124/82 (BP Location: Right Arm, Cuff Size: Normal)   Pulse 78   Temp 98 F (36.7 C)   Ht 5' (1.524 m)   Wt 133 lb 9.6 oz (60.6 kg)   SpO2 96%   BMI 26.09 kg/m   SpO2: 96 % O2 Device: None (Room air)  GENERAL: Well-developed, well-nourished woman, no acute distress. Fully ambulatory.  No conversational  dyspnea. HEAD: Normocephalic, atraumatic.  EYES: Pupils equal, round, reactive to light.  No scleral icterus.  MOUTH: Oral mucosa moist.  No thrush. NECK: Supple. No thyromegaly. Trachea midline. No JVD.  No adenopathy. PULMONARY: Good air entry bilaterally.  No adventitious sounds. CARDIOVASCULAR: S1 and S2. Regular rate and rhythm.  No rubs, murmurs or gallops heard. ABDOMEN: Benign. MUSCULOSKELETAL: No joint deformity, no clubbing, no edema.  NEUROLOGIC: No overt focal deficit, no gait disturbance, speech is fluent. SKIN: Intact,warm,dry. PSYCH: Normal mood, normal behavior.    Assessment & Plan:     ICD-10-CM   1. Squamous cell carcinoma of right lung (HCC)  C34.91     2. Lesion of right lung  R91.1     3. Flu vaccine need  Z23 Flu Vaccine Trivalent High Dose (Fluad)      Orders Placed This Encounter  Procedures   Flu Vaccine Trivalent High Dose (Fluad)    Meds ordered this encounter  Medications   azithromycin (ZITHROMAX) 250 MG tablet    Sig: Take 2 tablets (500 mg) on  Day 1,  followed by 1 tablet (250 mg) once daily on Days 2 through 5.    Dispense:  6 each    Refill:  0   Benefits, limitations and potential complications of the procedure were discussed with the patient/family.  Complications from bronchoscopy are rare and most often minor, but if they occur they may include breathing difficulty, vocal cord spasm, hoarseness, slight fever, vomiting, dizziness, bronchospasm, infection, low blood oxygen, bleeding from biopsy site, or an allergic reaction to medications.  It is uncommon for patients to experience other more serious complications for example: Collapsed lung requiring chest tube placement, respiratory failure, heart attack and/or cardiac arrhythmia.  Patient agrees to proceed.   Patient will have diagnostic bronchoscopy with endobronchial ultrasound and biopsies as needed.  This has been scheduled for 25 October at 9 AM.  Patient will be seen in follow-up in  4 to 6 weeks time.  She is to contact us prior to that time should any new difficulties arise.  Gailen Shelter, MD Advanced Bronchoscopy P[facility]    *This note was dictated using voice recognition software/Dragon.  Despite best efforts to proofread, errors can occur which can change the meaning. Any transcriptional errors that result from this process are unintentional and may not be fully corrected at the time of dictation.

## 2023-06-23 ENCOUNTER — Encounter: Payer: Self-pay | Admitting: Pulmonary Disease

## 2023-06-23 NOTE — Telephone Encounter (Signed)
For the codes 29562, [id number], [phone] Prior Auth Not Required Refer # [id number]

## 2023-06-26 ENCOUNTER — Other Ambulatory Visit: Payer: Medicare HMO

## 2023-06-26 ENCOUNTER — Other Ambulatory Visit: Payer: Self-pay | Admitting: Internal Medicine

## 2023-06-27 NOTE — Telephone Encounter (Signed)
Pre Admit appt is 10/17 8-1p.  I have notified the patient.  Nothing further needed.

## 2023-06-28 IMAGING — CT CT CHEST NODULE FOLLOW UP LOW DOSE W/O CM
2 of 4 series · 15 of 36 positions shown, 18 images · non-contrast
Comparison: Chest x-ray 11/08/2021.

CLINICAL DATA: Pulmonary nodule on recent chest x-ray.



[Series 3: lung thins · axial · 0.71mm/px · z∈[-182,+97]mm · 12 of 307 slices shown, 15 images]
[im 14/307  mediastinal]
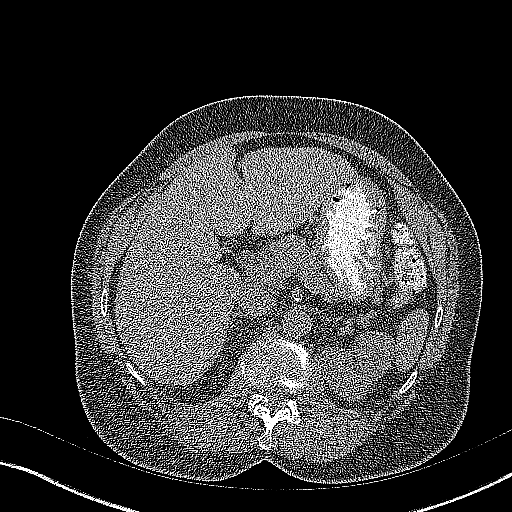
[im 14/307  lung]
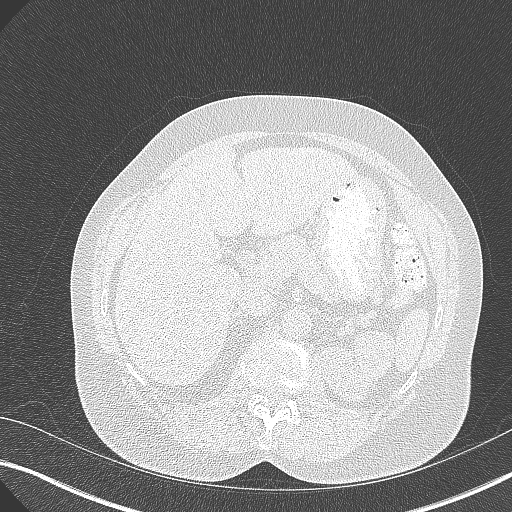
[im 42/307  lung]
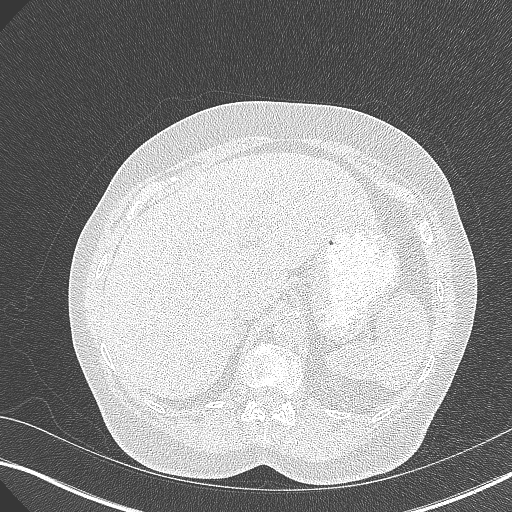
[im 70/307  lung]
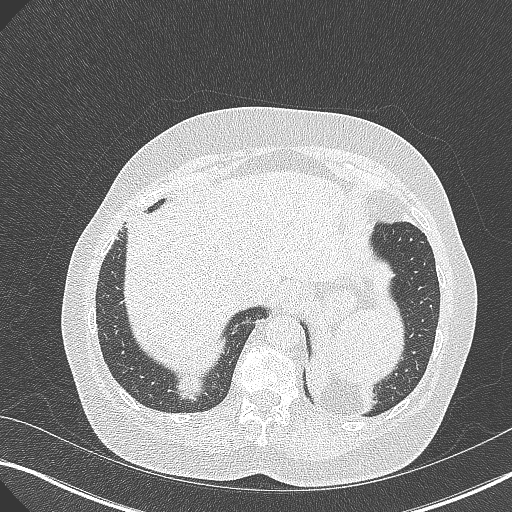
[im 98/307  lung]
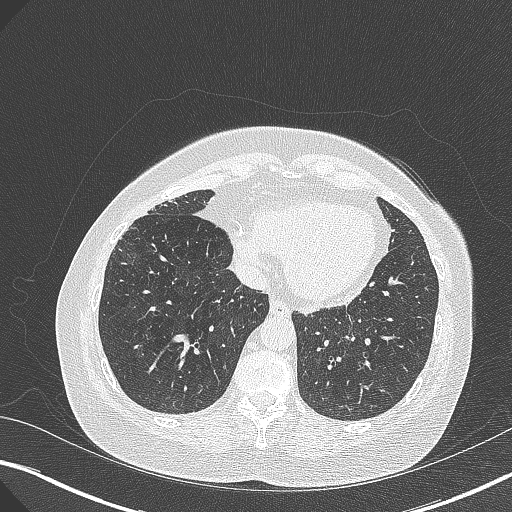
[im 112/307  mediastinal]
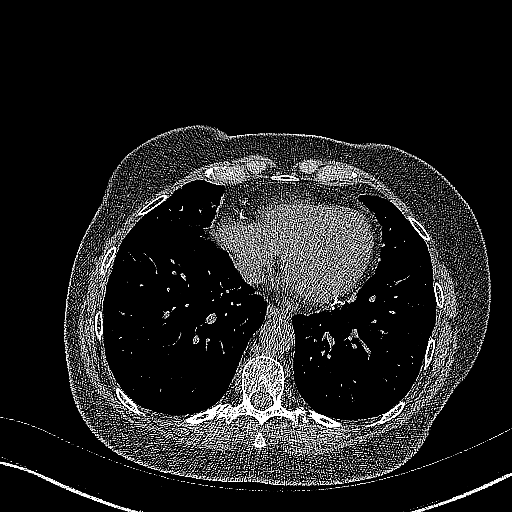
[im 112/307  lung]
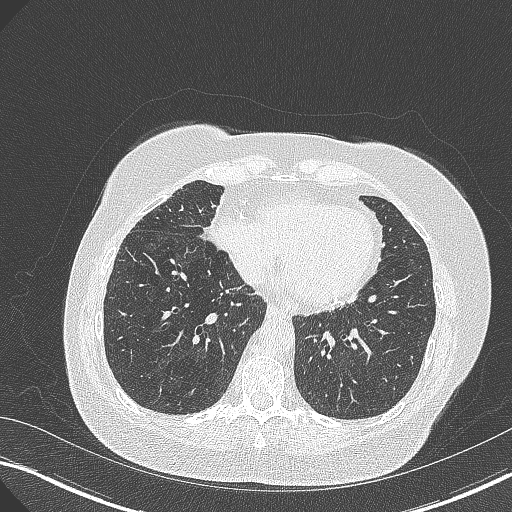
[im 140/307  lung]
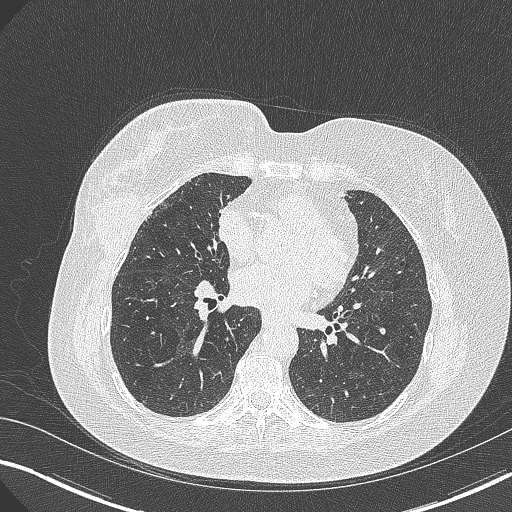
[im 167/307  lung]
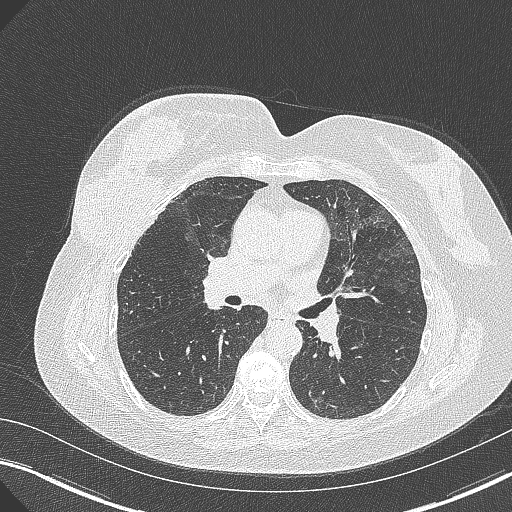
[im 195/307  lung]
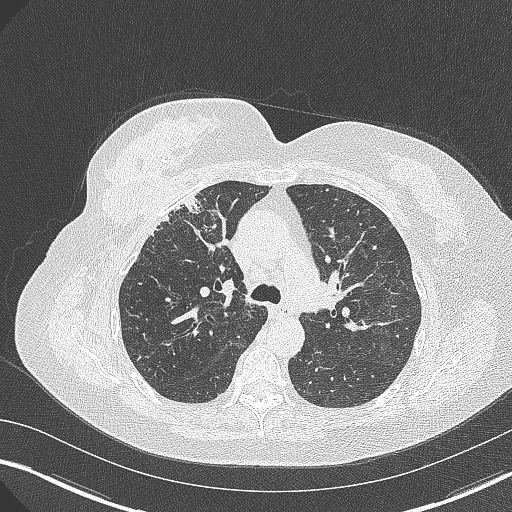
[im 209/307  mediastinal]
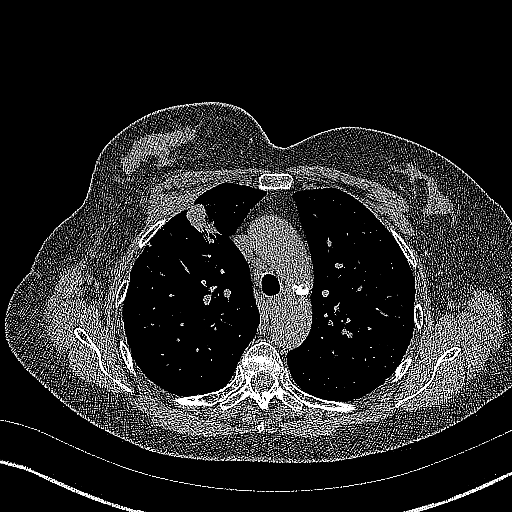
[im 209/307  lung]
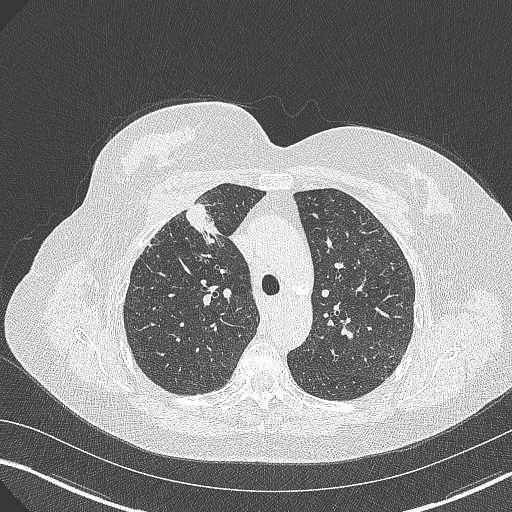
[im 237/307  lung]
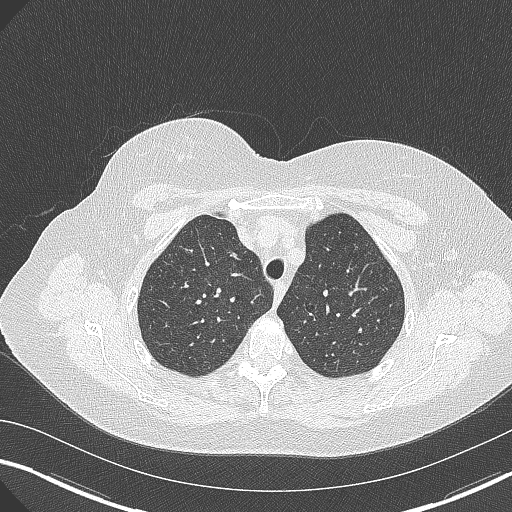
[im 265/307  lung]
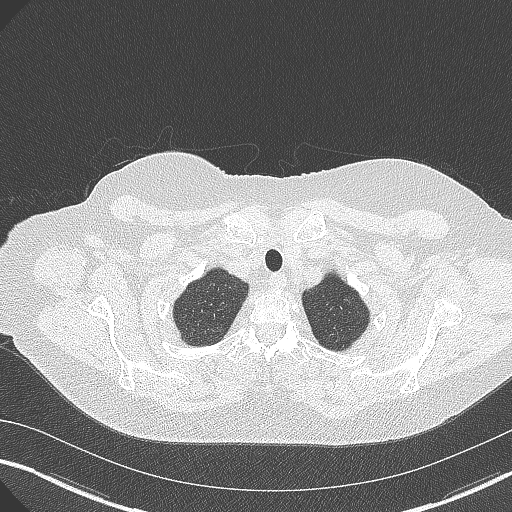
[im 293/307  lung]
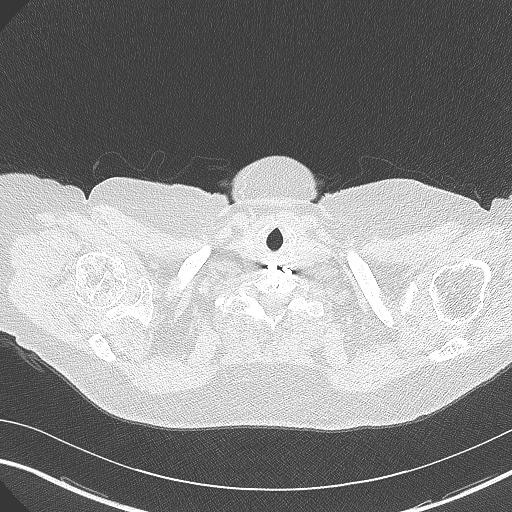

[Series 5: coronal · coronal · 0.61mm/px · 3 of 282 slices shown]
[im 57/282  lung]
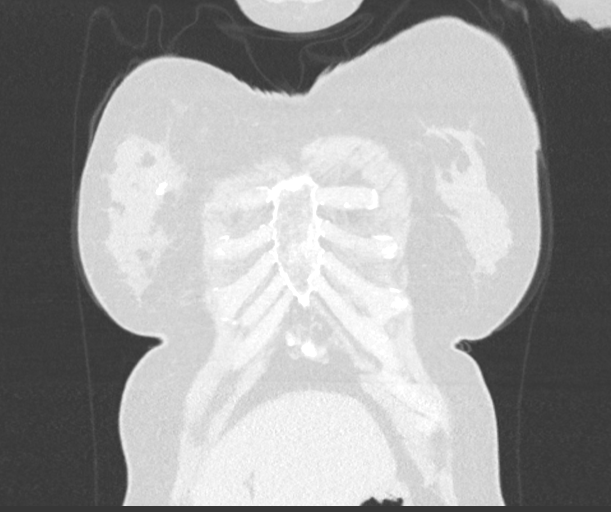
[im 113/282  lung]
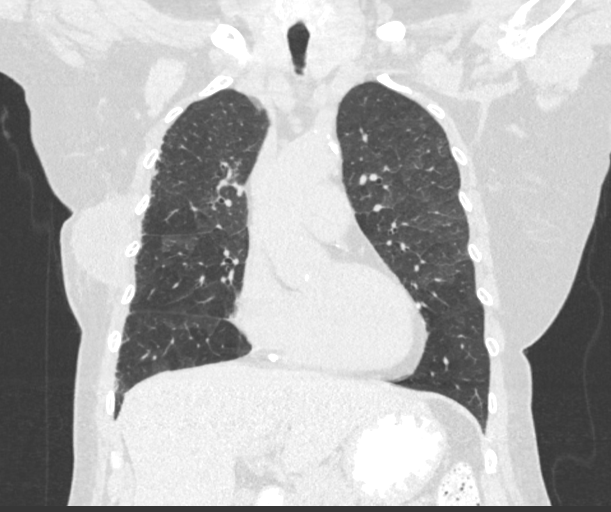
[im 169/282  lung]
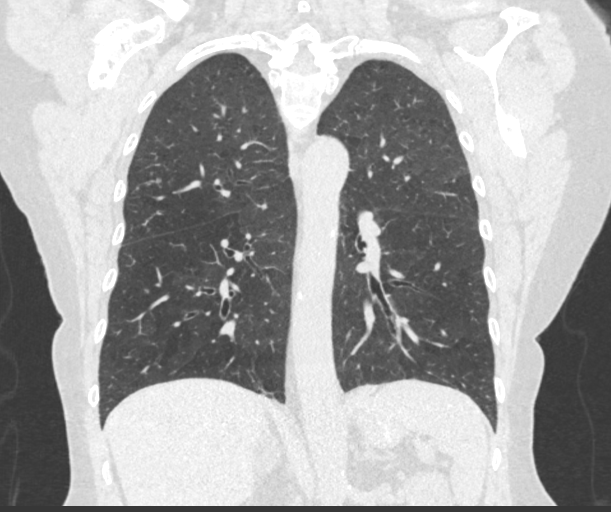

[15 of 36 positions shown; findings below may reference images not displayed]

FINDINGS: Cardiovascular: The heart size is normal. No substantial pericardial
effusion. Coronary artery calcification is evident. Mild
atherosclerotic calcification is noted in the wall of the thoracic
aorta.

Mediastinum/Nodes: 2.6 cm left thyroid nodule evident. 9 mm short
axis right paratracheal lymph node evident. No other mediastinal
lymphadenopathy. No evidence for gross hilar lymphadenopathy
although assessment is limited by the lack of intravenous contrast
on the current study. The esophagus has normal imaging features.
There is no axillary lymphadenopathy.

Lungs/Pleura: 1.9 x 1.4 x 2.5 cm irregular spiculated lesion is
identified in the anterior right upper lobe, tethered to the
anterior pleura. Additional subpleural reticulation is identified in
the anterior right upper lobe, potentially related to prior
radiation therapy. No other suspicious pulmonary nodule or mass. No
focal airspace consolidation. No pleural effusion.

Upper Abdomen: 2.3 cm right adrenal nodule has average attenuation
of negative 8 Hounsfield units, consistent with adenoma. No
follow-up recommended.

Musculoskeletal: No worrisome lytic or sclerotic osseous
abnormality. 6.5 x 4.1 x 6.6 cm fluid collection is identified in
the lateral right breast.
IMPRESSION: 1. 1.9 x 1.4 x 2.5 cm irregular spiculated lesion in the anterior
right upper lobe, tethered to the anterior pleura. If the patient
has had prior radiation to this region, scarring would be a
possibility but neoplasm could certainly have this appearance.
PET-CT recommended to further evaluate.
2. 9 mm short axis right paratracheal lymph node, nonspecific.
3. 6.5 x 4.1 x 6.6 cm fluid collection in the lateral right breast.
In the setting of prior lumpectomy, this could represent a chronic
seroma or hematoma.
4. 2.6 cm left thyroid nodule. Recommend thyroid US (ref: [HOSPITAL]. [DATE]): 143-50).
5. 2.3 cm right adrenal adenoma.  No follow-up recommended.
6. Aortic Atherosclerosis ([id number]).

## 2023-06-28 IMAGING — CT CT ABD-PELV W/ CM
2 of 5 series · 15 of 46 positions shown, 17 images · IV contrast (APPLIED)
Comparison: Comparison made with July 17, 2013.

CLINICAL DATA: Abdominal pain, follow-up for adrenal adenoma.

EXAM:
CT ABDOMEN AND PELVIS WITH CONTRAST
TECHNIQUE: Multidetector CT imaging of the abdomen and pelvis was performed
using the standard protocol following bolus administration of
intravenous contrast.

[Series 2: routine abd/pel with · axial · 0.80mm/px · z∈[-491,-101]mm · 12 of 88 slices shown, 14 images]
[im 5/88  soft-tissue]
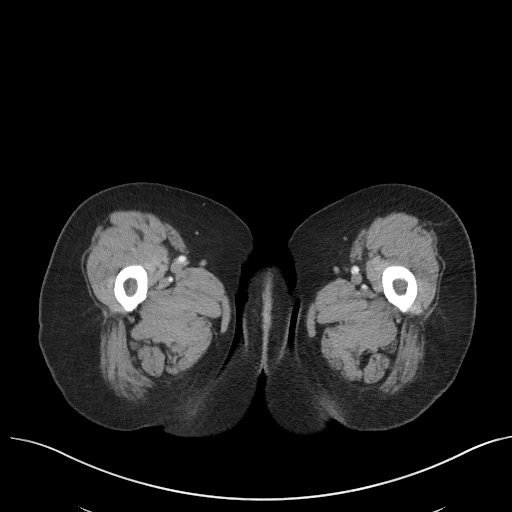
[im 5/88  bone]
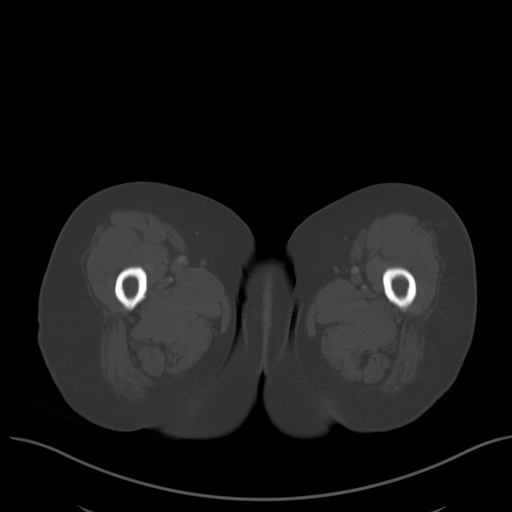
[im 15/88  soft-tissue]
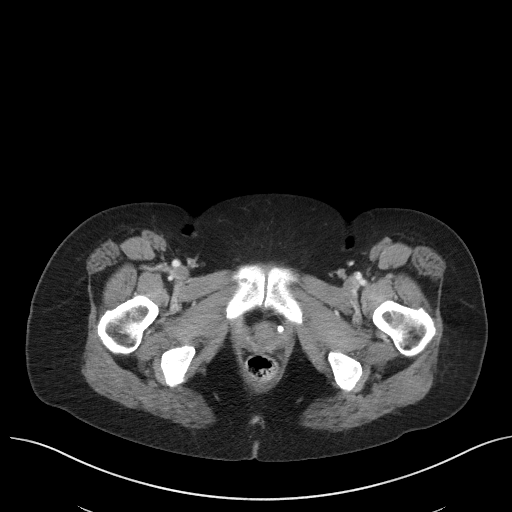
[im 20/88  soft-tissue]
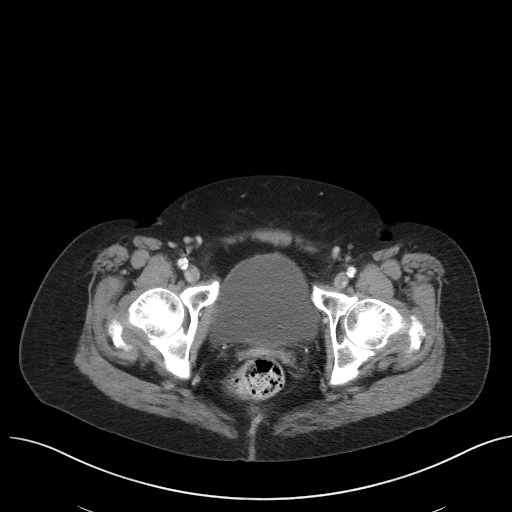
[im 25/88  soft-tissue]
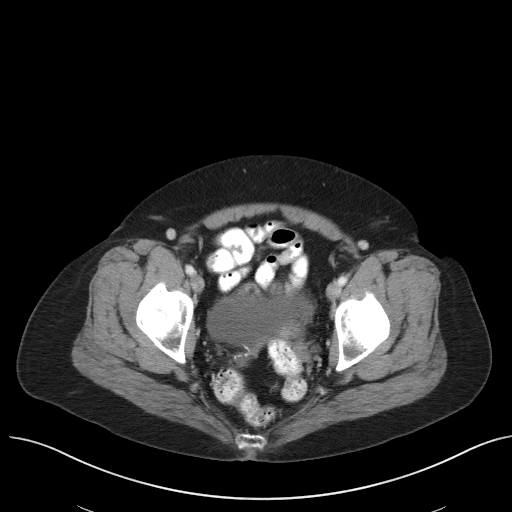
[im 34/88  soft-tissue]
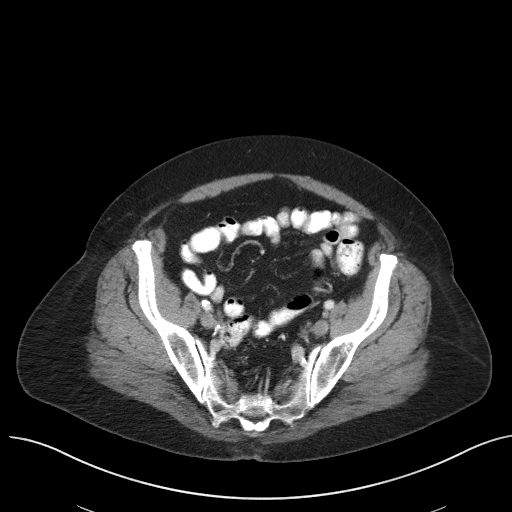
[im 39/88  soft-tissue]
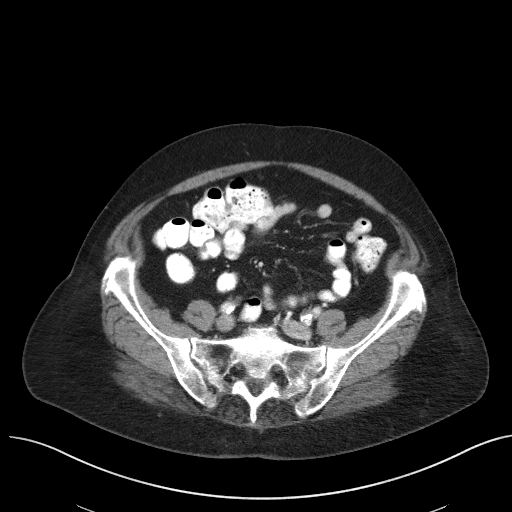
[im 49/88  soft-tissue]
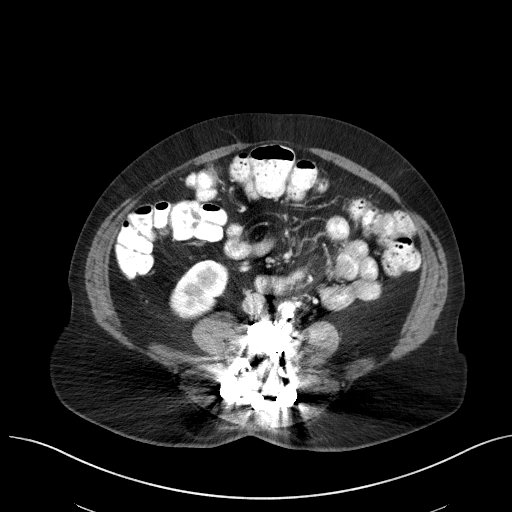
[im 54/88  soft-tissue]
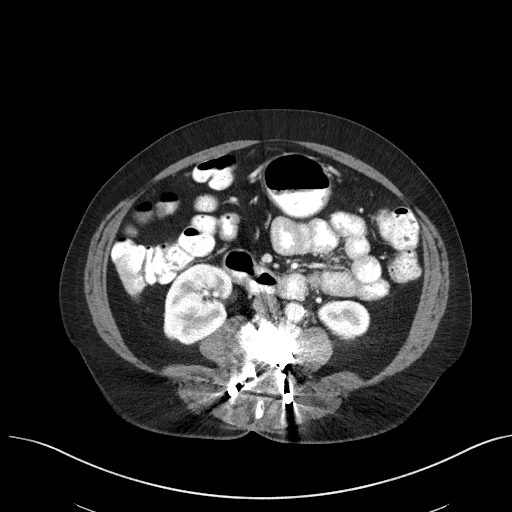
[im 63/88  soft-tissue]
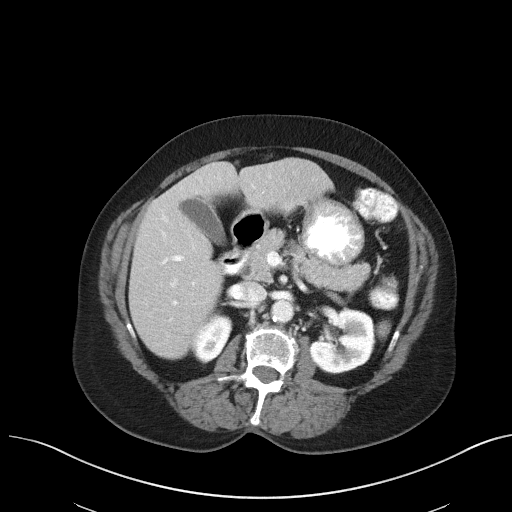
[im 63/88  bone]
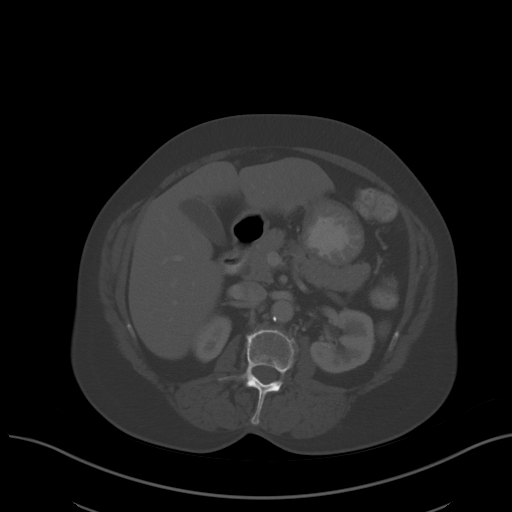
[im 68/88  soft-tissue]
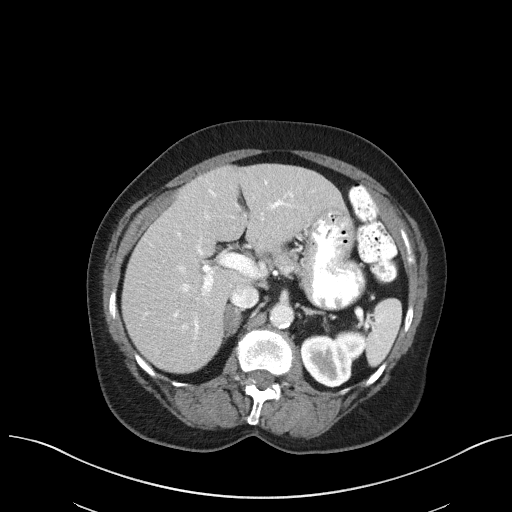
[im 73/88  soft-tissue]
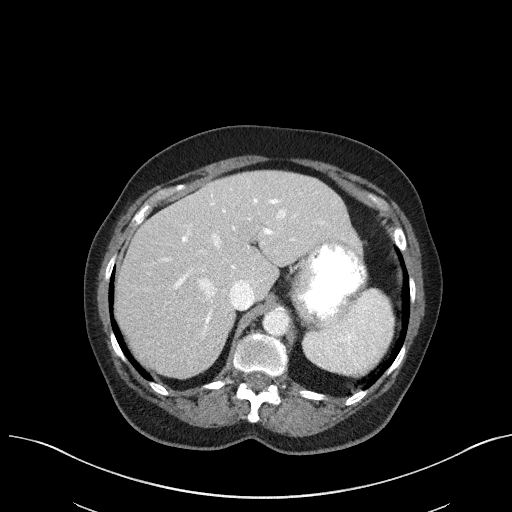
[im 83/88  soft-tissue]
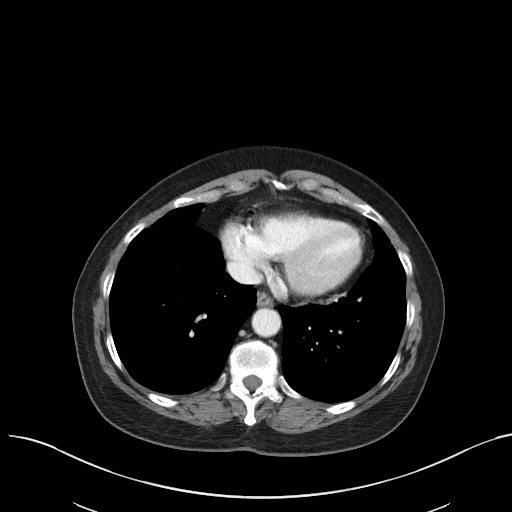

[Series 5: coronal st · coronal · 0.72mm/px · 3 of 96 slices shown]
[im 32/96  soft-tissue]
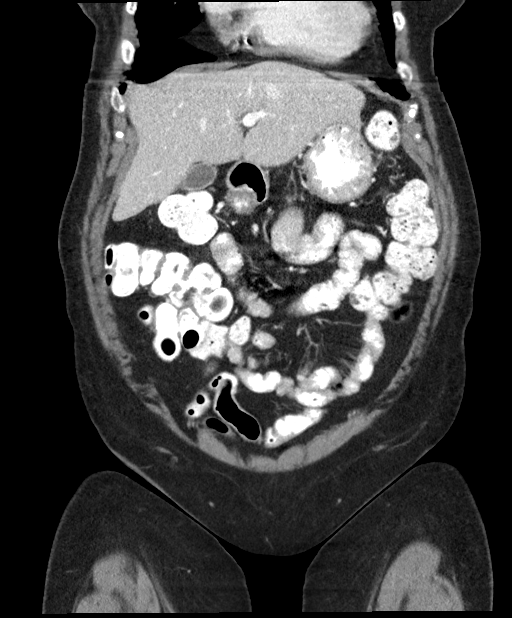
[im 43/96  soft-tissue]
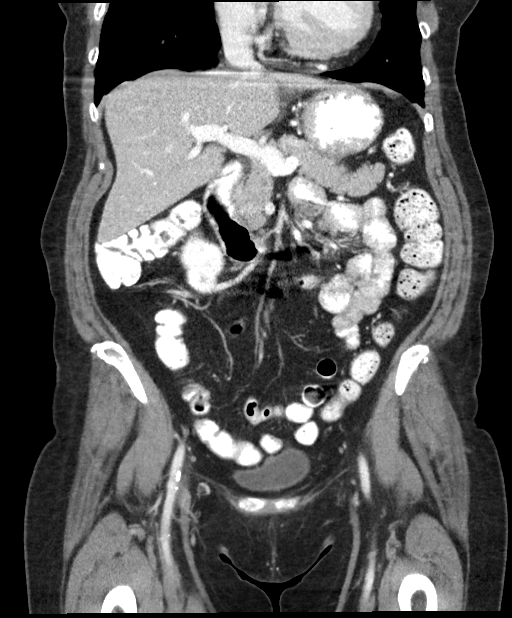
[im 53/96  soft-tissue]
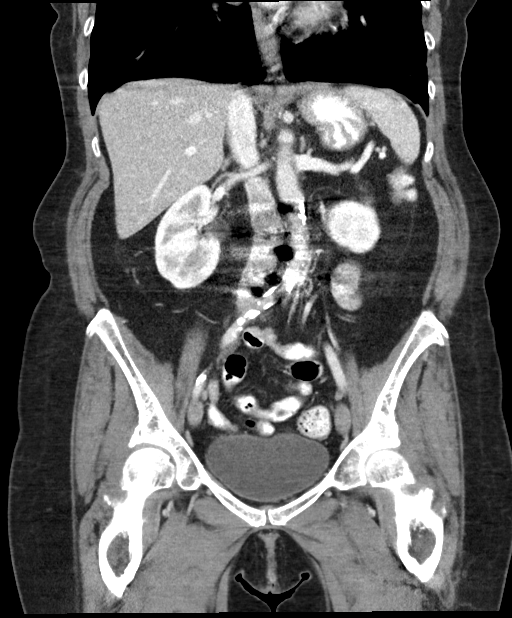

[15 of 46 positions shown; findings below may reference images not displayed]

RADIATION DOSE REDUCTION: This exam was performed according to the
departmental dose-optimization program which includes automated
exposure control, adjustment of the mA and/or kV according to
patient size and/or use of iterative reconstruction technique.

CONTRAST:  100mL OMNIPAQUE IOHEXOL 300 MG/ML  SOLN
FINDINGS: Lower chest: No effusion.  No consolidation.

Hepatobiliary: Liver with smooth contours. Background hepatic
steatosis. No pericholecystic stranding. Portal vein is patent. No
biliary duct distension.

Pancreas: Normal, without mass, inflammation or ductal dilatation.

Spleen: Normal.

Adrenals/Urinary Tract: Well-circumscribed RIGHT adrenal adenoma
measuring 2.3 x 1.4 cm unchanged. No follow-up recommended for this
finding.

LEFT adrenal gland is normal.

Moderate LEFT upper pole scarring is new. Enhancement of the kidneys
is otherwise symmetric without signs of hydronephrosis, perinephric
stranding or suspicious renal lesion. Nodular enhancing focus at the
base of the urinary bladder 9 x 8 mm (image 71/2) no perivesical
stranding.

Stomach/Bowel: No acute gastrointestinal process. Appendix not
visualized, no secondary signs to suggest acute appendicitis.
Ingested contrast media passes through the colon.

Vascular/Lymphatic:

Aortic atherosclerosis. No sign of aneurysm. Smooth contour of the
IVC. There is no gastrohepatic or hepatoduodenal ligament
lymphadenopathy. No retroperitoneal or mesenteric lymphadenopathy.

No pelvic sidewall lymphadenopathy.

Atherosclerotic changes are moderate both calcified and noncalcified
plaque is noted in the abdominal aorta and in the iliac vessels. No
signs

Reproductive: Post hysterectomy.

Other: No ascites.

Musculoskeletal: Signs of lumbar spinal fusion extending from L3
through L5. No acute bone finding. No destructive bone process.
Spinal degenerative changes.
IMPRESSION: 1. Nodular enhancing focus at the base of the urinary bladder,
measuring 9 x 8 mm. This is suspicious for urothelial neoplasm and
further evaluation with cystoscopy is suggested.
2. Signs of renal cortical scarring on the LEFT new from previous
imaging.
3. Background hepatic steatosis.
4. Aortic atherosclerosis.

Aortic Atherosclerosis ([id number]).

These results will be called to the ordering clinician or
representative by the Radiologist Assistant, and communication
documented in the PACS or [REDACTED].

## 2023-06-29 ENCOUNTER — Inpatient Hospital Stay: Admission: RE | Admit: 2023-06-29 | Payer: Medicare HMO | Source: Ambulatory Visit

## 2023-06-30 ENCOUNTER — Other Ambulatory Visit: Payer: Self-pay

## 2023-06-30 ENCOUNTER — Encounter
Admission: RE | Admit: 2023-06-30 | Discharge: 2023-06-30 | Disposition: A | Discharge status: Home / Self Care | Payer: Medicare HMO | Source: Ambulatory Visit | Attending: Pulmonary Disease

## 2023-06-30 DIAGNOSIS — Z01812 Encounter for preprocedural laboratory examination: Secondary | ICD-10-CM

## 2023-06-30 DIAGNOSIS — G4733 Obstructive sleep apnea (adult) (pediatric): Secondary | ICD-10-CM

## 2023-06-30 HISTORY — DX: Occlusion and stenosis of bilateral carotid arteries: I65.23

## 2023-06-30 HISTORY — DX: Solitary pulmonary nodule: R91.1

## 2023-06-30 HISTORY — DX: Spondylosis without myelopathy or radiculopathy, cervical region: M47.812

## 2023-06-30 NOTE — Plan of Care (Signed)
[facility] Tonsillectomy/Adenoidectomy, Postoperative PEDS care plan entered in error.

## 2023-06-30 NOTE — Patient Instructions (Addendum)
Your procedure is scheduled on: 07/07/2023 Friday  Report to the Registration Desk on the 1st floor of the [facility]. To find out your arrival time, please call [phone] between 1PM - 3PM on: 10/24/ 2024  If your arrival time is 6:00 am, do not arrive before that time as the [facility] entrance doors do not open until 6:00 am.  REMEMBER: Instructions that are not followed completely may result in serious medical risk, up to and including death; or upon the discretion of your surgeon and anesthesiologist your surgery may need to be rescheduled.  Do not eat food after midnight the night before surgery.  No gum chewing or hard candies.  One week prior to surgery: Stop Anti-inflammatories (NSAIDS) such as Advil, Aleve, Ibuprofen, Motrin, Naproxen, Naprosyn and Aspirin based products such as Excedrin, Goody's Powder, BC Powder.  Stop ANY OVER THE COUNTER supplements until after surgery.   You may however, continue to take Tylenol if needed for pain up until the day of surgery.  Continue taking all of your other prescription medications up until the day of surgery.  ON THE DAY OF SURGERY ONLY TAKE THESE MEDICATIONS WITH SIPS OF WATER:  omeprazole (PRILOSEC) sertraline (ZOLOFT  Use inhalers and nebulizer as prescribe and bring albuterol to the hospital on the day of surgery.  No Alcohol for 24 hours before or after surgery.  No Smoking including e-cigarettes for 24 hours before surgery.  No chewable tobacco products for at least 6 hours before surgery.  No nicotine patches on the day of surgery.  Do not use any "recreational" drugs for at least a week (preferably 2 weeks) before your surgery.  Please be advised that the combination of cocaine and anesthesia may have negative outcomes, up to and including death. If you test positive for cocaine, your surgery will be cancelled.  On the morning of surgery brush your teeth with toothpaste and water, you may rinse your mouth with  mouthwash if you wish. Do not swallow any toothpaste or mouthwash.    Do not wear jewelry, make-up, hairpins, clips or nail polish.  For welded (permanent) jewelry: bracelets, anklets, waist bands, etc.  Please have this removed prior to surgery.  If it is not removed, there is a chance that hospital personnel will need to cut it off on the day of surgery.  Do not wear lotions, powders, or perfumes.   Do not shave body hair from the neck down 48 hours before surgery.  Contact lenses, hearing aids and dentures may not be worn into surgery.  Do not bring valuables to the hospital. [facility] is not responsible for any missing/lost belongings or valuables.   Notify your doctor if there is any change in your medical condition (cold, fever, infection).  Wear comfortable clothing (specific to your surgery type) to the hospital.  After surgery, you can help prevent lung complications by doing breathing exercises.  Take deep breaths and cough every 1-2 hours. Your doctor may order a device called an Incentive Spirometer to help you take deep breaths. If you are being admitted to the hospital overnight, leave your suitcase in the car. After surgery it may be brought to your room.   If you are being discharged the day of surgery, you will not be allowed to drive home. You will need a responsible individual to drive you home and stay with you for 24 hours after surgery.   Please call the Pre-admissions Testing Dept. at [phone] if you have  any questions about these instructions.  Surgery Visitation Policy:  Patients having surgery or a procedure may have two visitors.  Children under the age of 33 must have an adult with them who is not the patient.

## 2023-07-03 ENCOUNTER — Encounter: Payer: Self-pay | Admitting: Pulmonary Disease

## 2023-07-03 NOTE — Progress Notes (Signed)
Perioperative / Anesthesia Services  Pre-Admission Testing Clinical Review / Pre-Operative Anesthesia Consult  Date: 07/05/23  Patient Demographics:  Name: Michelle Moore DOB:   1956-07-24 MRN:   [id number]  Planned Surgical Procedure(s):    Case: [id number] Date/Time: 07/07/23 0900   Procedures:      FLEXIBLE BRONCHOSCOPY (Right)     ENDOBRONCHIAL ULTRASOUND (Right)   Anesthesia type: General   Pre-op diagnosis: Collapsed right middle lobe   Location: [facility] PROCEDURE RM 02 / [facility] ORS FOR ANESTHESIA GROUP   Surgeons: Salena Saner, MD     NOTE: Available PAT nursing documentation and vital signs have been reviewed. Clinical nursing staff has updated patient's PMH/PSHx, current medication list, and drug allergies/intolerances to ensure comprehensive history available to assist in medical decision making as it pertains to the aforementioned surgical procedure and anticipated anesthetic course. Extensive review of available clinical information personally performed. [place] PMH and PSHx updated with any diagnoses/procedures that  may have been inadvertently omitted during her intake with the pre-admission testing department's nursing staff.  Clinical Discussion:  Michelle Moore is a 67 y.o. female who is submitted for pre-surgical anesthesia review and clearance prior to her undergoing the above procedure. Patient is a Former Smoker (30 pack years; quit 09/2015). Pertinent PMH includes: CAD, STEMI, aortic atherosclerosis, HTN, HLD, DOE, GERD (on daily PPI), COPD, OSAH (requires nocturnal PAP therapy), RIGHT breast cancer, RIGHT lung cancer (s/p RUL lobectomy), PRES syndrome, seizure (single; etiology unidentified), OA, RA, cervical DDD (s/p ACDF C5-C7), anxiety (on BZO PRN), depression.   Patient is followed by cardiology Corky Sing, MD). She was last seen in the cardiology clinic on 07/04/2023; notes reviewed. At the time of her clinic visit, patient doing well overall from a cardiovascular  perspective.  Patient with chronic exertional dyspnea related to her underlying COPD diagnosis.  Patient reports that her breathing is overall stable and at baseline; uses SABA MDI (Albuterol) as needed. Patient denied any chest pain, PND, orthopnea, palpitations, significant peripheral edema, weakness, fatigue, vertiginous symptoms, or presyncope/syncope. Patient with a past medical history significant for cardiovascular diagnoses. Documented physical exam was grossly benign, providing no evidence of acute exacerbation and/or decompensation of the patient's known cardiovascular conditions.  Patient suffered an inferior wall STEMI on 10/16/2009.  Diagnostic LEFT heart catheterization was performed revealing a normal left ventricular systolic function with an EF of 60%.  There was multivessel CAD; 50% OM1 and 99% distal RCA.  Patient subsequently underwent PCI placing a 3.0 x 18 mm Xience V DES x 1 to the dRCA yielding an excellent angiographic result and TIMI-3 flow.   Diagnostic LEFT heart catheterization was performed on 07/31/2014 revealing a normal left ventricular systolic function with an EF of 65%.  There was multivessel CAD; 40% proximal RCA, 20% proximal LCx, 40% OM1, 30% ostial RCA, 50% proximal RCA, and 20% mid RCA.  There was 10% ISR within the previously placed distal RCA stent.  Further intervention deferred opting for medical management.  Most recent TTE was performed on 10/10/2016 revealing a low normal left ventricular systolic function with mild LVH; LVEF 50%.  There was mild mitral and tricuspid valve regurgitation. All transvalvular gradients were noted to be normal providing no evidence suggestive of valvular stenosis. Aorta normal in size with no evidence of aneurysmal dilatation.   Most recent stress echocardiogram was performed on 03/08/2022 revealing a normal left ventricular systolic function with an EF of >55%.  Stress duration was 7:01, during which patient achieved a maximum  heart  rate of 136 bpm. Maximum workload was 10.10 METS. Right ventricular systolic function was normal. There was trivial mitral, tricuspid, and pulmonary valve regurgitation.  There was no evidence of valvular stenosis.  Blood pressure well controlled at 128/70 mmHg without the pharmacological intervention. Patient is on rosuvastatin for her HLD diagnosis and ASCVD prevention. Patient is not diabetic. She does have an OSAH diagnosis and is reported to be compliant with prescribed nocturnal PAP therapy..  Patient active per baseline.  She continues to work in an [employer] school.  Patient is able to complete all of her ADLs/IADLs independently without significant cardiovascular limitation.  Per the DASI, patient able to exceed 4 METS of physical activity without experiencing any significant degrees of angina/anginal equivalent symptoms.  No changes were made to her medication regimen during her visit with cardiology.  Patient scheduled to follow-up with outpatient cardiology in 6 months or sooner if needed.  [name] has a clinical diagnosis of stage IIIA squamous cell carcinoma (pT3, pN1, cM0).  She underwent pulmonary lobectomy on 03/17/2022, followed 4 cycles of systemic antineoplastic chemotherapy (docetaxel + cisplatin). Repeat imaging of the chest was performed on 06/05/2023 that demonstrated a new obstruction of the RIGHT middle lobe bronchus with associated volume loss in the RIGHT middle lobe concerning for recurrence. It is important to note that patient was approximately 4 weeks out from a SARS-CoV-2 infection at the time of her imaging. Oncology noting that this could be contributory to what is being seen on CT, however follow up with pulmonary medicine was warranted. Since imaging was performed, patient has been seen in follow up consult by Dr Jayme Cloud (pulmonary medicine); notes reviewed. Given past history of both lung and breast cancer, coupled with the results noted from patient's most  recent CT imaging of her chest, the decision was made to repeat bronchoscopy. Procedure will allow for direct visualization and tissue sampling needed for definitive confirmation of recurrent malignancy. Plan of care dicussed at length with patient and patient ultimately in agreement with proposed plan.   Patient has been scheduled for an FLEXIBLE BRONCHOSCOPY (Right); ENDOBRONCHIAL ULTRASOUND (Right) on 07/07/2023 with Dr. Sarina Ser, MD.  Given patient's past medical history significant for cardiovascular diagnoses, presurgical cardiac clearance was sought by the PAT team.  Per cardiology, "she will be at elevated but ACCEPTABLE risk for low risk bronchoscopy procedure. No further cardiac evaluation is indicated at this time. She is optimized from cardiac standpoint. Continue current medication".  In review of her medication reconciliation, the patient is not noted to be taking any type of anticoagulation or antiplatelet therapies that would need to be held during her perioperative course.  Patient reports previous perioperative complications with anesthesia in the past. Patient has been found to have a (+) difficult airway due to limited mouth opening and an anterior larynx. In review of the available records, it is noted that patient underwent a general anesthetic course at [facility] (ASA III) in 09/2022 with no documented complications.      06/22/2023    1:43 PM 06/22/2023    8:36 AM 05/19/2023   10:19 AM  Vitals with BMI  Height 5\' 0"  5\' 0"  5\' 0"   Weight 133 lbs 10 oz 132 lbs 13 oz   BMI 26.09 25.94   Systolic 124 137   Diastolic 82 69   Pulse 78 76     Providers/Specialists:   NOTE: Primary physician provider listed below. Patient may have been seen by APP or partner within same  practice.   PROVIDER ROLE / SPECIALTY LAST Barnie Del, MD Pulmonary Medicine (Surgeon) 06/22/2023  Carlean Jews, PA-C Primary Care Provider 05/19/2023  Windell Norfolk,  MD Cardiology 07/04/2023  Charlett Lango, MD Cardiothoracic Surgery 11/01/2022  Gerarda Fraction, MD Medical Oncology  06/22/2023   Allergies:  Other  Current Home Medications:   No current facility-administered medications for this encounter.    albuterol (PROVENTIL) (2.5 MG/3ML) 0.083% nebulizer solution   albuterol (VENTOLIN HFA) 108 (90 Base) MCG/ACT inhaler   alendronate (FOSAMAX) 70 MG tablet   ALPRAZolam (XANAX) 0.25 MG tablet   aspirin-acetaminophen-caffeine (EXCEDRIN MIGRAINE) 250-250-65 MG tablet   fluticasone (FLONASE) 50 MCG/ACT nasal spray   fluticasone-salmeterol (ADVAIR) 250-50 MCG/ACT AEPB   folic acid (FOLVITE) 1 MG tablet   gabapentin (NEURONTIN) 300 MG capsule   methotrexate (RHEUMATREX) 2.5 MG tablet   montelukast (SINGULAIR) 10 MG tablet   omeprazole (PRILOSEC) 40 MG capsule   potassium chloride (KLOR-CON) 10 MEQ tablet   rosuvastatin (CRESTOR) 40 MG tablet   sertraline (ZOLOFT) 100 MG tablet   traMADol (ULTRAM) 50 MG tablet   History:   Past Medical History:  Diagnosis Date   Adenoma of right adrenal gland 12/07/2021   a.) CT chest 12/07/2021; measured 2.3 cm; not FDG avid on 01/06/2022 PET CT.   Anxiety    a.) uses BZO (alprazolam) PRN   Aortic atherosclerosis (HCC)    Arthritis    Bilateral carotid artery stenosis    Bladder mass 12/07/2021   a.) CT abd 12/07/2021 --> 9 x 8 mm nodular enhancing focus at the base of the bladder; suspicious for urothelial neoplasm; b.) s/p TURBT 05/02/2022 --> pathology (-) for high grade dysplasia/malignancy   Cervical spondylosis without myelopathy    a.) s/p ACDF C5-C7   COPD (chronic obstructive pulmonary disease) (HCC)    Coronary artery disease 10/16/2009   a.) LHC/PCI 10/16/2009: EF 60%; 50% OM1, 99% dRCA (3.0 x 18 mm Xience V DES). b.) LHC 07/31/2014: EF 65%; 40% pLAD, 20% pLCx, 40% OM1, 30% oRCA, 50% pRCA, 20% mRCA, 10% ISR stent to dRCA; further intervention deferred opting for med. mgmt.    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 b.) s/p lumpectomy, adjuvant XRT; currently on extended AI (anastrozole) therapy   Essential hypertension    GERD (gastroesophageal reflux disease)    Hepatic steatosis    History of 2019 novel coronavirus disease (COVID-19) 04/2023   HLD (hyperlipidemia)    Left thyroid nodule 12/07/2021   a.) CT chest 12/07/2021: 2.6 cm; b.) CT chest 06/05/2023: hypoattenuating --> 2.5 cm   Long term current use of immunosuppressive drug    a.) MTX for RA.   OSA on CPAP    currently not using   Osteopenia    Personal history of radiation therapy    Pneumonia    PRES (posterior reversible encephalopathy syndrome) 07/14/2014   Pulmonary nodules 11/08/2021   a.) CXR 11/08/2021: 18mm nodular density RUL. b.) CT chest 12/07/2021: 1.9 x 1.4 x 2.5 spiculated lesion anterior RUL. c.) PET CT 01/06/2022: 2.2 x 1.9 spiculated anterior RUL mass (SUV max 16.4); d.) Bx (+) NSCLC, favoring SCC with tumor necrosis; p40/CK7/GATA3 (+), TTF-1 (-)   Rheumatoid arthritis (HCC)    a.) on MTX   Seasonal allergies  Seizures (HCC) 2014   a.) x 1 episode; etiology never determined.   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   Tubular adenoma    Past Surgical History:  Procedure Laterality Date   ABDOMINAL HYSTERECTOMY     ANTERIOR CERVICAL DECOMP/DISCECTOMY FUSION N/A 01/20/2014   Procedure: ANTERIOR CERVICAL DECOMPRESSION/DISCECTOMY FUSION 2 LEVELS cervical  five/six six Arline Asp;  Surgeon: Cristi Loron, MD;  Location: [facility];  Service: Neurosurgery;  Laterality: N/A;   APPENDECTOMY     BACK SURGERY     lumbar   BLADDER INSTILLATION N/A 05/02/2022   Procedure: BLADDER INSTILLATION OF GEMCITABINE;  Surgeon: Vanna Scotland, MD;  Location: [facility] ORS;  Service: Urology;  Laterality: N/A;   BREAST BIOPSY Right 02/01/2018   x shape, DCIS    BREAST BIOPSY  01/16/2019   coil, BENIGN MAMMARY TISSUE WITH MODERATE STROMAL FIBROSIS AND COARSE DYSTROPHIC CALCIFICATIONS of the RIGHT breast   BREAST LUMPECTOMY Right 03/07/2018   Procedure: BREAST LUMPECTOMY/ RE EXCISION;  Surgeon: Carolan Shiver, MD;  Location: [facility] ORS;  Service: General;  Laterality: Right;   CARDIAC CATHETERIZATION Left 07/31/2014   Procedure: CARDIAC CATHETERIZATION; Location: [facility]; Surgeon: Arnoldo Hooker, MD   COLONOSCOPY WITH PROPOFOL N/A 02/11/2021   Procedure: COLONOSCOPY WITH PROPOFOL;  Surgeon: Midge Minium, MD;  Location: [facility];  Service: Endoscopy;  Laterality: N/A;  Requests Early   CORONARY ANGIOPLASTY WITH STENT PLACEMENT Left 10/16/2009   Procedure: CORONARY ANGIOPLASTY WITH STENT PLACEMENT; Location: [facility]; Surgeon: Rudean Hitt, MD   CYSTOSCOPY W/ RETROGRADES Bilateral 05/02/2022   Procedure: CYSTOSCOPY WITH RETROGRADE PYELOGRAM;  Surgeon: Vanna Scotland, MD;  Location: [facility] ORS;  Service: Urology;  Laterality: Bilateral;   ESOPHAGOGASTRODUODENOSCOPY (EGD) WITH PROPOFOL N/A 10/07/2022   Procedure: ESOPHAGOGASTRODUODENOSCOPY (EGD) WITH PROPOFOL;  Surgeon: Midge Minium, MD;  Location: [facility];  Service: Endoscopy;  Laterality: N/A;  sleep apnea   INTERCOSTAL NERVE BLOCK Right 03/17/2022   Procedure: INTERCOSTAL NERVE BLOCK;  Surgeon: Loreli Slot, MD;  Location: [facility] OR;  Service: Thoracic;  Laterality: Right;   LASIK Bilateral    LUNG LOBECTOMY (RIGHT UPPER LOBE) Right 03/17/2022   LYMPH NODE DISSECTION Right 03/17/2022    Procedure: LYMPH NODE DISSECTION;  Surgeon: Loreli Slot, MD;  Location: [facility] OR;  Service: Thoracic;  Laterality: Right;   PARTIAL MASTECTOMY WITH NEEDLE LOCALIZATION Right 02/21/2018   Procedure: PARTIAL MASTECTOMY WITH NEEDLE LOCALIZATION;  Surgeon: Carolan Shiver, MD;  Location: [facility] ORS;  Service: General;  Laterality: Right;   POLYPECTOMY N/A 02/11/2021   Procedure: POLYPECTOMY;  Surgeon: Midge Minium, MD;  Location: [facility];  Service: Endoscopy;  Laterality: N/A;   ROTATOR CUFF REPAIR Left    SHOULDER ARTHROSCOPY WITH ROTATOR CUFF REPAIR AND OPEN BICEPS TENODESIS Right 08/07/2019   Procedure: SHOULDER ARTHROSCOPY WITH DEBRIDEMENT, DECOMPRESSION, ROTATOR CUFF REPAIR AND BICEPS TENODESIS. - RNFA;  Surgeon: Christena Flake, MD;  Location: [facility] ORS;  Service: Orthopedics;  Laterality: Right;   TOE SURGERY Right    has pin and plate in it   TRANSURETHRAL RESECTION OF BLADDER TUMOR N/A 05/02/2022   Procedure: TRANSURETHRAL RESECTION OF BLADDER TUMOR (TURBT);  Surgeon: Vanna Scotland, MD;  Location: [facility] ORS;  Service: Urology;  Laterality: N/A;   VIDEO BRONCHOSCOPY WITH ENDOBRONCHIAL ULTRASOUND N/A 03/02/2022   Procedure: VIDEO BRONCHOSCOPY WITH ENDOBRONCHIAL ULTRASOUND;  Surgeon: Salena Saner, MD;  Location: [facility] ORS;  Service: Cardiopulmonary;  Laterality: N/A;   Family History  Problem Relation Age of Onset   Breast cancer Cousin 75       bilateral at 58 and 62; daughter of maternal aunt who was unaffected   Lung cancer Maternal Aunt        dx 83s; deceased 80s; smoker   Heart attack Father        deceased 33   Stomach cancer Maternal Grandmother    Social History   Tobacco Use   Smoking status: Former    Current packs/day: 0.00    Average packs/day: 1 pack/day for 30.0 years (30.0 ttl pk-yrs)    Types: Cigarettes    Start date: 09/12/1985    Quit date: 09/13/2015    Years since quitting: 7.8   Smokeless tobacco: Never  Vaping Use   Vaping status:  Never Used  Substance Use Topics   Alcohol use: Yes    Comment: occasional   Drug use: No    Pertinent Clinical Results:  LABS:   No visits with results within 3 Day(s) from this visit.  Latest known visit with results is:  Appointment on 06/22/2023  Component Date Value Ref Range Status   Sodium 06/22/2023 139  135 - 145 mmol/L Final   Potassium 06/22/2023 4.9  3.5 - 5.1 mmol/L Final   Chloride 06/22/2023 106  98 - 111 mmol/L Final   CO2 06/22/2023 27  22 - 32 mmol/L Final   Glucose, Bld 06/22/2023 116 (H)  70 - 99 mg/dL Final   Glucose reference range applies only to samples taken after fasting for at least 8 hours.   BUN 06/22/2023 15  8 - 23 mg/dL Final   Creatinine 38/75/6433 0.79  0.44 - 1.00 mg/dL Final   Calcium 29/51/8841 8.9  8.9 - 10.3 mg/dL Final   Total Protein 66/02/3015 7.0  6.5 - 8.1 g/dL Final   Albumin 09/20/3233 3.8  3.5 - 5.0 g/dL Final   AST 57/32/2025 20  15 - 41 U/L Final   ALT 06/22/2023 16  0 - 44 U/L Final   Alkaline Phosphatase 06/22/2023 76  38 - 126 U/L Final   Total Bilirubin 06/22/2023 0.3  0.3 - 1.2 mg/dL Final   GFR, Estimated 06/22/2023 >60  >60 mL/min Final   Comment: (NOTE) Calculated using the CKD-EPI Creatinine Equation (2021)    Anion gap 06/22/2023 6  5 - 15 Final   Performed at [facility], [address] [place], [place] [address]   WBC 06/22/2023 7.4  4.0 - 10.5 K/uL Final   RBC 06/22/2023 3.96  3.87 - 5.11 MIL/uL Final   Hemoglobin 06/22/2023 12.1  12.0 - 15.0 g/dL Final   HCT 23/76/2831 38.3  36.0 - 46.0 % Final   MCV 06/22/2023 96.7  80.0 - 100.0 fL Final   MCH 06/22/2023 30.6  26.0 - 34.0 pg Final   MCHC 06/22/2023 31.6  30.0 - 36.0 g/dL Final   RDW 51/76/1607 15.9 (H)  11.5 - 15.5 % Final   Platelets 06/22/2023 205  150 - 400 K/uL Final   nRBC 06/22/2023 0.0  0.0 - 0.2 % Final   Neutrophils Relative % 06/22/2023 64  % Final   Neutro Abs 06/22/2023 4.7  1.7 -  7.7 K/uL Final   Lymphocytes Relative 06/22/2023 23  %  Final   Lymphs Abs 06/22/2023 1.7  0.7 - 4.0 K/uL Final   Monocytes Relative 06/22/2023 10  % Final   Monocytes Absolute 06/22/2023 0.7  0.1 - 1.0 K/uL Final   Eosinophils Relative 06/22/2023 2  % Final   Eosinophils Absolute 06/22/2023 0.2  0.0 - 0.5 K/uL Final   Basophils Relative 06/22/2023 1  % Final   Basophils Absolute 06/22/2023 0.1  0.0 - 0.1 K/uL Final   Immature Granulocytes 06/22/2023 0  % Final   Abs Immature Granulocytes 06/22/2023 0.03  0.00 - 0.07 K/uL Final   Performed at [facility], [address] [place], [place] [address]    ECG: Date: 07/04/2023 Rate: 66 bpm Rhythm: normal sinus Axis (leads I and aVF): Normal Intervals: PR 118 ms. QRS 84 ms. QTc 438 ms. ST segment and T wave changes: No evidence of acute ST segment elevation or depression.   Comparison: Similar to previous tracing obtained on 07/07/2022   IMAGING / PROCEDURES: MR CERVICAL SPINE WO CONTRAST performed on 06/22/2023 Previous ACDF C5 through C7. Wide patency of the canal and foramina at those levels. C3-4: Edematous facet arthritis on the right which could be painful. Endplate osteophytes and bulging of the disc. Foraminal encroachment on the right that could affect the C4 nerve. C4-5: Bulging of the disc. Mild facet degeneration on the right. No compressive stenosis. C7-T1: Mild facet osteoarthritis. Mild bilateral foraminal narrowing, right more than left, not visibly compressive.  CT CHEST W CONTRAST performed on 06/05/2023 New obstruction of the right middle lobe bronchus with increasing volume loss in the right middle lobe. Please correlate clinically. Centrally obstructing lesion cannot be definitively excluded Hypoattenuating left thyroid nodule. Recommend thyroid ultrasound. (Ref: J Am Coll Radiol. 2015 Feb;12(2): 143-50).  Right adrenal adenoma Aortic atherosclerosis Coronary artery calcification. Emphysema  MRI BRAIN W WO CONTRAST performed on 04/21/2022 No evidence of  intracranial metastatic disease    NM PET IMAGE INITIAL (PI) SKULL BASE TO THIGH performed on 01/06/2022 2.2 cm spiculated nodule in the anterior right upper lobe, favoring primary bronchogenic neoplasm over metastasis. Dominant 12 mm short axis low right paratracheal node, indeterminate. Attention on follow-up is suggested. Destructive changes involving the right lateral 7th rib, worrisome for pathologic fracture. Bandlike opacity in the anterior left upper lobe, favored to be infectious/inflammatory. Postoperative seroma in the right breast, non FDG avid. Benign right adrenal adenoma.   CT ABDOMEN PELVIS W CONTRAST performed on 12/07/2021 Nodular enhancing focus at the base of the urinary bladder, measuring 9 x 8 mm. This is suspicious for urothelial neoplasm and further evaluation with cystoscopy is suggested. Signs of renal cortical scarring on the LEFT new from previous imaging. Background hepatic steatosis. Aortic atherosclerosis.   CT CHEST NODULE FOLLOW UP WO CONTRAST performed on 12/07/2021 1.9 x 1.4 x 2.5 cm irregular spiculated lesion in the anterior right upper lobe, tethered to the anterior pleura. If the patient has had prior radiation to this region, scarring would be a possibility but neoplasm could certainly have this appearance. PET-CT recommended to further evaluate. 9 mm short axis right paratracheal lymph node, nonspecific. 6.5 x 4.1 x 6.6 cm fluid collection in the lateral right breast. In the setting of prior lumpectomy, this could represent a chronic seroma or hematoma. 2.6 cm left thyroid nodule. 2.3 cm right adrenal adenoma.  No follow-up recommended. Aortic atherosclerosis    DIAGNOSTIC RADIOGRAPHS OF CHEST 2 VIEWS performed on 11/08/2021  Large lung volumes with chronic interstitial coarsening (COPD). There is no edema, consolidation, effusion, or pneumothorax.  Vague nodular density over the right upper chest, likely anterior on the lateral view, measuring 18  mm, not seen on prior.  Normal heart size and mediastinal contours  Impression and Plan:  Michelle Moore has been referred for pre-anesthesia review and clearance prior to her undergoing the planned anesthetic and procedural courses. Available labs, pertinent testing, and imaging results were personally reviewed by me in preparation for upcoming operative/procedural course. [facility] medical record has been updated following extensive record review and patient interview with PAT staff.   This patient has been appropriately cleared by cardiology with an overall ACCEPTABLE risk of experiencing significant perioperative cardiovascular complications. Based on clinical review performed today (07/05/23), barring any significant acute changes in the patient's overall condition, it is anticipated that she will be able to proceed with the planned surgical intervention. Any acute changes in clinical condition may necessitate her procedure being postponed and/or cancelled. Patient will meet with anesthesia team (MD and/or CRNA) on the day of her procedure for preoperative evaluation/assessment. Questions regarding anesthetic course will be fielded at that time.   Pre-surgical instructions were reviewed with the patient during her PAT appointment, and questions were fielded to satisfaction by PAT clinical staff. She has been instructed on which medications that she will need to hold prior to surgery, as well as the ones that have been deemed safe/appropriate to take on the day of her procedure. As part of the general education provided by PAT, patient made aware both verbally and in writing, that she would need to abstain from the use of any illegal substances during her perioperative course.  She was advised that failure to follow the provided instructions could necessitate case cancellation or result in serious perioperative complications up to and including death. Patient encouraged to contact PAT and/or her  surgeon's office to discuss any questions or concerns that may arise prior to surgery; verbalized understanding.   Quentin Mulling, MSN, APRN, FNP-C, CEN [facility]  Perioperative Services Nurse Practitioner Phone: [phone] Fax: [phone] 07/05/23 9:21 AM  NOTE: This note has been prepared using Dragon dictation software. Despite my best ability to proofread, there is always the potential that unintentional transcriptional errors may still occur from this process.

## 2023-07-04 ENCOUNTER — Encounter: Payer: Self-pay | Admitting: Urgent Care

## 2023-07-04 ENCOUNTER — Encounter
Admission: RE | Admit: 2023-07-04 | Discharge: 2023-07-04 | Disposition: A | Discharge status: Home / Self Care | Payer: Medicare HMO | Source: Ambulatory Visit | Attending: Pulmonary Disease

## 2023-07-04 DIAGNOSIS — J449 Chronic obstructive pulmonary disease, unspecified: Secondary | ICD-10-CM | POA: Diagnosis not present

## 2023-07-04 DIAGNOSIS — I2510 Atherosclerotic heart disease of native coronary artery without angina pectoris: Secondary | ICD-10-CM | POA: Diagnosis not present

## 2023-07-04 DIAGNOSIS — Z9861 Coronary angioplasty status: Secondary | ICD-10-CM | POA: Diagnosis not present

## 2023-07-04 DIAGNOSIS — E785 Hyperlipidemia, unspecified: Secondary | ICD-10-CM | POA: Diagnosis not present

## 2023-07-06 MED ORDER — ORAL CARE MOUTH RINSE: 15.0000 mL | Freq: Once | OROMUCOSAL | Status: AC

## 2023-07-06 MED ORDER — SODIUM CHLORIDE 0.9 % IV SOLN: Freq: Once | INTRAVENOUS | Status: AC

## 2023-07-06 MED ORDER — CHLORHEXIDINE GLUCONATE 0.12 % MT SOLN
15.0000 mL | Freq: Once | OROMUCOSAL | Status: AC
  Administered 2023-07-07: 15 mL via OROMUCOSAL

## 2023-07-07 ENCOUNTER — Ambulatory Visit: Payer: Medicare HMO | Admitting: Urgent Care

## 2023-07-07 ENCOUNTER — Ambulatory Visit: Payer: Medicare HMO

## 2023-07-07 ENCOUNTER — Encounter: Payer: Self-pay | Admitting: Pulmonary Disease

## 2023-07-07 ENCOUNTER — Ambulatory Visit
Admission: RE | Admit: 2023-07-07 | Discharge: 2023-07-07 | Disposition: A | Discharge status: Home / Self Care | Payer: Medicare HMO | Attending: Pulmonary Disease | Admitting: Pulmonary Disease

## 2023-07-07 ENCOUNTER — Encounter
Admission: RE | Disposition: A | Discharge status: Home / Self Care | Payer: Self-pay | Source: Home / Self Care | Attending: Pulmonary Disease

## 2023-07-07 ENCOUNTER — Other Ambulatory Visit: Payer: Self-pay

## 2023-07-07 DIAGNOSIS — F419 Anxiety disorder, unspecified: Secondary | ICD-10-CM | POA: Insufficient documentation

## 2023-07-07 DIAGNOSIS — Z8616 Personal history of COVID-19: Secondary | ICD-10-CM | POA: Diagnosis not present

## 2023-07-07 DIAGNOSIS — Z79899 Other long term (current) drug therapy: Secondary | ICD-10-CM | POA: Diagnosis not present

## 2023-07-07 DIAGNOSIS — K219 Gastro-esophageal reflux disease without esophagitis: Secondary | ICD-10-CM | POA: Diagnosis not present

## 2023-07-07 DIAGNOSIS — Z48813 Encounter for surgical aftercare following surgery on the respiratory system: Secondary | ICD-10-CM | POA: Diagnosis not present

## 2023-07-07 DIAGNOSIS — I2510 Atherosclerotic heart disease of native coronary artery without angina pectoris: Secondary | ICD-10-CM | POA: Diagnosis not present

## 2023-07-07 DIAGNOSIS — J449 Chronic obstructive pulmonary disease, unspecified: Secondary | ICD-10-CM | POA: Insufficient documentation

## 2023-07-07 DIAGNOSIS — R911 Solitary pulmonary nodule: Secondary | ICD-10-CM | POA: Insufficient documentation

## 2023-07-07 DIAGNOSIS — C3491 Malignant neoplasm of unspecified part of right bronchus or lung: Secondary | ICD-10-CM | POA: Diagnosis not present

## 2023-07-07 DIAGNOSIS — Z1211 Encounter for screening for malignant neoplasm of colon: Secondary | ICD-10-CM

## 2023-07-07 DIAGNOSIS — G4730 Sleep apnea, unspecified: Secondary | ICD-10-CM | POA: Diagnosis not present

## 2023-07-07 DIAGNOSIS — Z87891 Personal history of nicotine dependence: Secondary | ICD-10-CM | POA: Diagnosis not present

## 2023-07-07 DIAGNOSIS — Z955 Presence of coronary angioplasty implant and graft: Secondary | ICD-10-CM | POA: Diagnosis not present

## 2023-07-07 DIAGNOSIS — I10 Essential (primary) hypertension: Secondary | ICD-10-CM | POA: Insufficient documentation

## 2023-07-07 DIAGNOSIS — I252 Old myocardial infarction: Secondary | ICD-10-CM | POA: Insufficient documentation

## 2023-07-07 DIAGNOSIS — J9819 Other pulmonary collapse: Secondary | ICD-10-CM | POA: Diagnosis not present

## 2023-07-07 DIAGNOSIS — J9811 Atelectasis: Secondary | ICD-10-CM | POA: Insufficient documentation

## 2023-07-07 DIAGNOSIS — Z902 Acquired absence of lung [part of]: Secondary | ICD-10-CM

## 2023-07-07 HISTORY — DX: Fatty (change of) liver, not elsewhere classified: K76.0

## 2023-07-07 HISTORY — PX: FLEXIBLE BRONCHOSCOPY: SHX5094

## 2023-07-07 HISTORY — PX: ENDOBRONCHIAL ULTRASOUND: SHX5096

## 2023-07-07 SURGERY — BRONCHOSCOPY, FLEXIBLE
Anesthesia: General | Laterality: Right

## 2023-07-07 MED ORDER — OXYCODONE HCL 5 MG/5ML PO SOLN
5.0000 mg | Freq: Once | ORAL | Status: DC | PRN
Start: 2023-07-07 — End: 2023-07-07

## 2023-07-07 MED ORDER — ROCURONIUM BROMIDE 100 MG/10ML IV SOLN
INTRAVENOUS | Status: DC | PRN
  Administered 2023-07-07: 30 mg via INTRAVENOUS

## 2023-07-07 MED ORDER — PROPOFOL 10 MG/ML IV BOLUS
INTRAVENOUS | Status: DC | PRN
  Administered 2023-07-07: 130 mg via INTRAVENOUS

## 2023-07-07 MED ORDER — PHENYLEPHRINE 80 MCG/ML (10ML) SYRINGE FOR IV PUSH (FOR BLOOD PRESSURE SUPPORT)
PREFILLED_SYRINGE | INTRAVENOUS | Status: DC | PRN
  Administered 2023-07-07: 160 ug via INTRAVENOUS

## 2023-07-07 MED ORDER — SUCCINYLCHOLINE CHLORIDE 200 MG/10ML IV SOSY
PREFILLED_SYRINGE | INTRAVENOUS | Status: AC
  Filled 2023-07-07: qty 10

## 2023-07-07 MED ORDER — LACTATED RINGERS IV SOLN: INTRAVENOUS | Status: DC | PRN

## 2023-07-07 MED ORDER — IPRATROPIUM-ALBUTEROL 0.5-2.5 (3) MG/3ML IN SOLN
RESPIRATORY_TRACT | Status: AC
  Filled 2023-07-07: qty 3

## 2023-07-07 MED ORDER — DEXMEDETOMIDINE HCL IN NACL 80 MCG/20ML IV SOLN
INTRAVENOUS | Status: DC | PRN
Start: 2023-07-07 — End: 2023-07-07
  Administered 2023-07-07: 8 ug via INTRAVENOUS

## 2023-07-07 MED ORDER — CHLORHEXIDINE GLUCONATE 0.12 % MT SOLN
OROMUCOSAL | Status: AC
  Filled 2023-07-07: qty 15

## 2023-07-07 MED ORDER — PROPOFOL 10 MG/ML IV BOLUS
INTRAVENOUS | Status: AC
  Filled 2023-07-07: qty 20

## 2023-07-07 MED ORDER — SUCCINYLCHOLINE CHLORIDE 200 MG/10ML IV SOSY
PREFILLED_SYRINGE | INTRAVENOUS | Status: DC | PRN
  Administered 2023-07-07: 80 mg via INTRAVENOUS

## 2023-07-07 MED ORDER — FENTANYL CITRATE (PF) 100 MCG/2ML IJ SOLN
INTRAMUSCULAR | Status: DC | PRN
  Administered 2023-07-07 (×2): 50 ug via INTRAVENOUS

## 2023-07-07 MED ORDER — ROCURONIUM BROMIDE 10 MG/ML (PF) SYRINGE
PREFILLED_SYRINGE | INTRAVENOUS | Status: AC
  Filled 2023-07-07: qty 10

## 2023-07-07 MED ORDER — ONDANSETRON HCL 4 MG/2ML IJ SOLN
INTRAMUSCULAR | Status: AC
  Filled 2023-07-07: qty 2

## 2023-07-07 MED ORDER — ONDANSETRON HCL 4 MG/2ML IJ SOLN: 4.0000 mg | Freq: Once | INTRAMUSCULAR | Status: DC | PRN

## 2023-07-07 MED ORDER — IPRATROPIUM-ALBUTEROL 0.5-2.5 (3) MG/3ML IN SOLN
3.0000 mL | Freq: Once | RESPIRATORY_TRACT | Status: AC
  Administered 2023-07-07: 3 mL via RESPIRATORY_TRACT

## 2023-07-07 MED ORDER — PHENYLEPHRINE 80 MCG/ML (10ML) SYRINGE FOR IV PUSH (FOR BLOOD PRESSURE SUPPORT)
PREFILLED_SYRINGE | INTRAVENOUS | Status: AC
  Filled 2023-07-07: qty 10

## 2023-07-07 MED ORDER — FENTANYL CITRATE (PF) 100 MCG/2ML IJ SOLN
INTRAMUSCULAR | Status: AC
  Filled 2023-07-07: qty 2

## 2023-07-07 MED ORDER — OXYCODONE HCL 5 MG PO TABS: 5.0000 mg | ORAL_TABLET | Freq: Once | ORAL | Status: DC | PRN

## 2023-07-07 MED ORDER — SUGAMMADEX SODIUM 200 MG/2ML IV SOLN
INTRAVENOUS | Status: DC | PRN
  Administered 2023-07-07: 200 mg via INTRAVENOUS

## 2023-07-07 MED ORDER — FENTANYL CITRATE (PF) 100 MCG/2ML IJ SOLN: 25.0000 ug | INTRAMUSCULAR | Status: DC | PRN

## 2023-07-07 NOTE — Anesthesia Procedure Notes (Signed)
Procedure Name: Intubation Date/Time: 07/07/2023 9:24 AM  Performed by: Omer Jack, CRNAPre-anesthesia Checklist: Patient identified, Patient being monitored, Timeout performed, Emergency Drugs available and Suction available Patient Re-evaluated:Patient Re-evaluated prior to induction Oxygen Delivery Method: Circle system utilized Preoxygenation: Pre-oxygenation with 100% oxygen Induction Type: IV induction and Rapid sequence Ventilation: Mask ventilation without difficulty Laryngoscope Size: 3 and McGraph Grade View: Grade III Tube type: Oral Tube size: 8.0 mm Number of attempts: 1 Airway Equipment and Method: Stylet Placement Confirmation: ETT inserted through vocal cords under direct vision, positive ETCO2 and breath sounds checked- equal and bilateral Secured at: 21 cm Tube secured with: Tape Dental Injury: Teeth and Oropharynx as per pre-operative assessment

## 2023-07-07 NOTE — Transfer of Care (Signed)
Immediate Anesthesia Transfer of Care Note  Patient: Michelle Moore  Procedure(s) Performed: FLEXIBLE BRONCHOSCOPY (Right) ENDOBRONCHIAL ULTRASOUND (Right)  Patient Location: PACU  Anesthesia Type:General  Level of Consciousness: drowsy and patient cooperative  Airway & Oxygen Therapy: Patient Spontanous Breathing and Patient connected to face mask oxygen  Post-op Assessment: Report given to RN and Post -op Vital signs reviewed and stable  Post vital signs: Reviewed and stable  Last Vitals:  Vitals Value Taken Time  BP 121/49 07/07/23 0952  Temp 36.4 C 07/07/23 0952  Pulse 83 07/07/23 1000  Resp 21 07/07/23 1000  SpO2 99 % 07/07/23 1000  Vitals shown include unfiled device data.  Last Pain:  Vitals:   07/07/23 0952  TempSrc:   PainSc: Asleep         Complications: No notable events documented.

## 2023-07-07 NOTE — Discharge Instructions (Addendum)
You have been provided with an Acapella flutter valve.  This is a device that helps you clear mucus.  You can use it several times a day.  It works best after you use your inhalers.  On your bronchoscopy today there was no evidence of cancer recurrence.  You did have significant amount of mucus that was blocking the segment of the lung called the right middle lobe.  We cleared this for you.  Keep your follow-up appointments as scheduled.  AMBULATORY SURGERY  DISCHARGE INSTRUCTIONS   The drugs that you were given will stay in your system until tomorrow so for the next 24 hours you should not:  Drive an automobile Make any legal decisions Drink any alcoholic beverage   You may resume regular meals tomorrow.  Today it is better to start with liquids and gradually work up to solid foods.  You may eat anything you prefer, but it is better to start with liquids, then soup and crackers, and gradually work up to solid foods.   Please notify your doctor immediately if you have any unusual bleeding, trouble breathing, redness and pain at the surgery site, drainage, fever, or pain not relieved by medication.    Additional Instructions:   Please contact your physician with any problems or Same Day Surgery at [phone], Monday through Friday 6 am to 4 pm, or [place] at [facility] number at [phone].

## 2023-07-07 NOTE — Op Note (Signed)
PROCEDURE:  BRONCHOSCOPY THERAPEUTIC ASPIRATION OF THE TRACHEOBRONCHIAL TREE EBUS EXAMINATION OF THE MEDIASTINUM   PROCEDURE DATE: 07/07/2023  TIME:  NAME:  Michelle Moore  DOB:Aug 28, 1956  MRN: [id number] LOC:  ARPO/None    HOSP DAY: N/A   Indications/Preliminary Diagnosis: Patient is a 67 year old former smoker with a 30-pack-year history of smoking status post right upper lobectomy for squamous cell carcinoma of the lung on 17 March 2022 with 1 out of 18 nodes positive for carcinoma.  Patient modified to stage IIIa.  Underwent 4 cycles of adjuvant cisplatin and Taxotere.  Surveillance CT chest performed September 2024 showed obstruction of the right middle lobe bronchus with increasing volume loss in the right middle lobe centrally obstructing lesion versus mucus impaction could not be excluded.  Preliminary diagnosis: Query recurrent carcinoma  Proposed procedure: Bronchoscopy with mediastinal staging with endobronchial ultrasound.  Consent: (Place X beside choice/s below)  The benefits, risks and possible complications of the procedure were        explained to:  _X__ patient  ___ patient's family  ___ other:___________  who verbalized understanding and gave:  ___ verbal  ___ written  _X__ verbal and written  ___ telephone  ___ other:________ consent.      Unable to obtain consent; procedure performed on emergent basis.     Other:    Benefits, limitations and potential complications of the procedure were discussed with the patient/family.  Complications from bronchoscopy are rare and most often minor, but if they occur they may include breathing difficulty, vocal cord spasm, hoarseness, slight fever, vomiting, dizziness, bronchospasm, infection, low blood oxygen, bleeding from biopsy site, or an allergic reaction to medications.  It is uncommon for patients to experience other more serious complications for example: Collapsed lung requiring chest tube placement, respiratory failure,  heart attack and/or cardiac arrhythmia.  Patient agrees to proceed.    Anesthesia type: General endotracheal  Surgeon: Gailen Shelter, MD Assistant/Scrub: Everlean Alstrom, RRT Anesthesiologist/CRNA: Gerrit Heck. Darleene Cleaver, MD/Janice Zachery Dakins, CRNA Cytotechnology: Available   PROCEDURE DETAILS: Patient was taken to Procedure Room 2 (Bronchoscopy Suite) in the OR area.  Appropriate Timeout performed and correct patient, name, ID and laterality confirmed.  Patient was inducted under general anesthesia and intubated with an #8 ET tube without difficulty.  Once the patient was under adequate general anesthesia a Portex adapter was placed in the ET tube flange.  Through the Portex adapter the Olympus video therapeutic bronchoscope was then advanced.  The visible distal trachea was normal, carina appeared sharp.  Examination of the right tracheobronchial tree showed airway distortion.  The right upper lobe was absent with the suture line well-healed, right middle lobe not visible due to severe mucus impaction.  Air was significant distortion of the airway in the upper lung zones.  The lower lobe bronchi were free of endobronchial lesions or any other abnormality.  The mucus impaction on the right middle lobe required sequential lavage and aspiration until all debris was cleared.  The orifice was then rendered patent once the mucus impaction was cleared.  The middle lobe did not show any lesions.  The opening was fishmouthed.  Suspect some of this is postoperative distortion.  At this point bronchoscope was brought to the left mainstem and examination revealed no endobronchial lesions on the left tracheobronchial tree.  There were significant inspissated secretions on the left lower lobe and these were lavaged till clear.  After completing this, the bronchoscope was switched to an endobronchial ultrasound scope (EBUS scope) and  the mediastinum was examined.  Precarinal, subcarinal and hilar nodal stations  were examined.  There was no evidence of adenopathy.  Having completed this, the EBUS scope was retrieved and again replaced for the therapeutic video bronchoscope.  The airway was then examined thoroughly to ensure adequate hemostasis.  After examination and inspection and noting excellent hemostasis, the patient received 9 mL of 1% lidocaine via bronchial lavage prior to retrieving the bronchoscope.  At this point the procedure was terminated and the patient was allowed to emerge from general anesthesia.  Patient was extubated in the procedure room and transported to the PACU in satisfactory condition.  The patient did not have any complaints of chest pain or shortness of breath postprocedure.  Postprocedure examination shows symmetrical lung sounds.  Postprocedure chest x-ray showed no pneumothorax.  Patient tolerated the procedure well.   SPECIMENS (Sites): (Place X beside choice below)  Specimens Description  X No Specimens Obtained     Washings    Lavage    Biopsies    Fine Needle Aspirates    Brushings    Sputum    FINDINGS:  Distortion of the right mainstem bronchus after surgical resection of the right upper lobe:   Therapeutic aspiration of the tracheobronchial tree with attention to the right middle lobe with tenacious mucus plugs being aspirated:   Distal segments of right middle lobe patent after clearing mucus impaction:   Right middle lobe orifice after clearing mucus impaction orifice now patent, orifice is "fishmouthed"    ESTIMATED BLOOD LOSS: Less than 2 mL   COMPLICATIONS/RESOLUTION: none, post procedure chest x-ray showed no pneumothorax:     IMPRESSION:POST-PROCEDURE DX:  Right middle lobe atelectasis due to mucous impaction Status post successful therapeutic aspiration of the tracheobronchial tree No evidence of mediastinal adenopathy by endobronchial ultrasound inspection No evidence of recurrence of malignancy   RECOMMENDATION/PLAN:  Patient provided  with Acapella flutter valve Instructed on proper pulmonary hygiene Continue surveillance chest CTs per oncology     C. Danice Goltz, MD Advanced Bronchoscopy PCCM [facility]    *This note was dictated using voice recognition software/Dragon.  Despite best efforts to proofread, errors can occur which can change the meaning.  Any change was purely unintentional.

## 2023-07-07 NOTE — Anesthesia Postprocedure Evaluation (Signed)
Anesthesia Post Note  Patient: Michelle LAFEVERS  Procedure(s) Performed: FLEXIBLE BRONCHOSCOPY (Right) ENDOBRONCHIAL ULTRASOUND (Right)  Patient location during evaluation: PACU Anesthesia Type: General Level of consciousness: awake and awake and alert Pain management: pain level controlled Vital Signs Assessment: post-procedure vital signs reviewed and stable Respiratory status: nonlabored ventilation Cardiovascular status: blood pressure returned to baseline Anesthetic complications: no   No notable events documented.   Last Vitals:  Vitals:   07/07/23 0818 07/07/23 0952  BP: 127/68 (!) 121/49  Pulse: 74 86  Resp: 15 19  Temp: 36.4 C 36.4 C  SpO2: 97% 99%    Last Pain:  Vitals:   07/07/23 0952  TempSrc:   PainSc: Asleep                 [name],[name]

## 2023-07-07 NOTE — Anesthesia Preprocedure Evaluation (Signed)
Anesthesia Evaluation  Patient identified by MRN, date of birth, ID band Patient awake    Reviewed: Allergy & Precautions, NPO status , Patient's Chart, lab work & pertinent test results  History of Anesthesia Complications (+) DIFFICULT AIRWAY and history of anesthetic complications  Airway Mallampati: IV  TM Distance: <3 FB Neck ROM: full  Mouth opening: Limited Mouth Opening  Dental  (+) Teeth Intact, Chipped   Pulmonary neg pulmonary ROS, sleep apnea , COPD, former smoker S/p R lobectomy 2023   Pulmonary exam normal  + decreased breath sounds      Cardiovascular Exercise Tolerance: Good hypertension, Pt. on medications + CAD, + Past MI, + Cardiac Stents and + DOE  negative cardio ROS Normal cardiovascular exam Rhythm:Regular Rate:Normal     Neuro/Psych Seizures -,   Anxiety     negative neurological ROS  negative psych ROS   GI/Hepatic negative GI ROS, Neg liver ROS,GERD  ,,  Endo/Other  negative endocrine ROS    Renal/GU negative Renal ROS     Musculoskeletal   Abdominal Normal abdominal exam  (+)   Peds negative pediatric ROS (+)  Hematology negative hematology ROS (+)   Anesthesia Other Findings Past Medical History: 12/07/2021: Adenoma of right adrenal gland     Comment:  a.) CT chest 12/07/2021; measured 2.3 cm; not FDG avid               on 01/06/2022 PET CT. No date: Anxiety     Comment:  a.) uses BZO (alprazolam) PRN No date: Aortic atherosclerosis (HCC) No date: Arthritis No date: Bilateral carotid artery stenosis 12/07/2021: Bladder mass     Comment:  a.) CT abd 12/07/2021 --> 9 x 8 mm nodular enhancing               focus at the base of the bladder; suspicious for               urothelial neoplasm; b.) s/p TURBT 05/02/2022 -->               pathology (-) for high grade dysplasia/malignancy No date: Cervical spondylosis without myelopathy     Comment:  a.) s/p ACDF C5-C7 No date: COPD  (chronic obstructive pulmonary disease) (HCC) 10/16/2009: Coronary artery disease     Comment:  a.) LHC/PCI 10/16/2009: EF 60%; 50% OM1, 99% dRCA (3.0 x              18 mm Xience V DES). b.) LHC 07/31/2014: EF 65%; 40%               pLAD, 20% pLCx, 40% OM1, 30% oRCA, 50% pRCA, 20% mRCA,               10% ISR stent to dRCA; further intervention deferred               opting for med. mgmt. No date: Depression 03/02/2022: Difficult intubation     Comment:  a.) anterior larynx; b.) limited oral opening No date: DOE (dyspnea on exertion) 02/01/2018: Ductal carcinoma in situ (DCIS) of right breast     Comment:  a.) high grade DCIS; comedo type. b.) s/p lumpectomy,               adjuvant XRT; currently on extended AI (anastrozole)               therapy No date: Essential hypertension No date: GERD (gastroesophageal reflux disease) No date: Hepatic steatosis 04/2023: History of 2019 novel coronavirus  disease (COVID-19) No date: HLD (hyperlipidemia) 12/07/2021: Left thyroid nodule     Comment:  a.) CT chest 12/07/2021: 2.6 cm; b.) CT chest               06/05/2023: hypoattenuating --> 2.5 cm No date: Long term current use of immunosuppressive drug     Comment:  a.) MTX for RA. No date: OSA on CPAP     Comment:  currently not using No date: Osteopenia No date: Personal history of radiation therapy No date: Pneumonia 07/14/2014: PRES (posterior reversible encephalopathy syndrome) 11/08/2021: Pulmonary nodules     Comment:  a.) CXR 11/08/2021: 18mm nodular density RUL. b.) CT               chest 12/07/2021: 1.9 x 1.4 x 2.5 spiculated lesion               anterior RUL. c.) PET CT 01/06/2022: 2.2 x 1.9 spiculated              anterior RUL mass (SUV max 16.4); d.) Bx (+) NSCLC,               favoring SCC with tumor necrosis; p40/CK7/GATA3 (+),               TTF-1 (-) No date: Rheumatoid arthritis (HCC)     Comment:  a.) on MTX No date: Seasonal allergies 2014: Seizures (HCC)     Comment:   a.) x 1 episode; etiology never determined. No date: Sinus headache 03/17/2022: Squamous cell lung cancer, right (HCC)     Comment:  a.) RUL lobectomy 03/17/2022 --> Bx (+) for invasive               moderately differentiated SCC of the RIGHT upper lobe               (PDL-1: 5%) --> stage IIIA SCC (pT3, pN1, cM0) 10/16/2009: ST elevation myocardial infarction (STEMI) of inferior  wall (HCC)     Comment:  a.) LHC/PCI 10/16/2009: EF 60%; 50% OM1, 99% dRCA (3.0 x              18 mm Xience V DES). No date: Tubular adenoma  Past Surgical History: No date: ABDOMINAL HYSTERECTOMY 01/20/2014: ANTERIOR CERVICAL DECOMP/DISCECTOMY FUSION; N/A     Comment:  Procedure: ANTERIOR CERVICAL DECOMPRESSION/DISCECTOMY               FUSION 2 LEVELS cervical five/six six Arline Asp;  Surgeon:               Cristi Loron, MD;  Location: [facility];  Service:              Neurosurgery;  Laterality: N/A; No date: APPENDECTOMY No date: BACK SURGERY     Comment:  lumbar 05/02/2022: BLADDER INSTILLATION; N/A     Comment:  Procedure: BLADDER INSTILLATION OF GEMCITABINE;                Surgeon: Vanna Scotland, MD;  Location: [facility] ORS;                Service: Urology;  Laterality: N/A; 02/01/2018: BREAST BIOPSY; Right     Comment:  x shape, DCIS  01/16/2019: BREAST BIOPSY     Comment:  coil, BENIGN MAMMARY TISSUE WITH MODERATE STROMAL               FIBROSIS AND COARSE DYSTROPHIC CALCIFICATIONS of the  RIGHT breast 03/07/2018: BREAST LUMPECTOMY; Right     Comment:  Procedure: BREAST LUMPECTOMY/ RE EXCISION;  Surgeon:               Carolan Shiver, MD;  Location: [facility] ORS;  Service:              General;  Laterality: Right; 07/31/2014: CARDIAC CATHETERIZATION; Left     Comment:  Procedure: CARDIAC CATHETERIZATION; Location: [facility];               Surgeon: Arnoldo Hooker, MD 02/11/2021: COLONOSCOPY WITH PROPOFOL; N/A     Comment:  Procedure: COLONOSCOPY WITH PROPOFOL;  Surgeon: Midge Minium, MD;  Location: [facility];  Service:               Endoscopy;  Laterality: N/A;  Requests Early 10/16/2009: CORONARY ANGIOPLASTY WITH STENT PLACEMENT; Left     Comment:  Procedure: CORONARY ANGIOPLASTY WITH STENT PLACEMENT;               Location: [facility]; Surgeon: Rudean Hitt, MD 05/02/2022: CYSTOSCOPY W/ RETROGRADES; Bilateral     Comment:  Procedure: CYSTOSCOPY WITH RETROGRADE PYELOGRAM;                Surgeon: Vanna Scotland, MD;  Location: [facility] ORS;                Service: Urology;  Laterality: Bilateral; 10/07/2022: ESOPHAGOGASTRODUODENOSCOPY (EGD) WITH PROPOFOL; N/A     Comment:  Procedure: ESOPHAGOGASTRODUODENOSCOPY (EGD) WITH               PROPOFOL;  Surgeon: Midge Minium, MD;  Location: [facility]               SURGERY [facility];  Service: Endoscopy;  Laterality: N/A;                sleep apnea 03/17/2022: INTERCOSTAL NERVE BLOCK; Right     Comment:  Procedure: INTERCOSTAL NERVE BLOCK;  Surgeon:               Loreli Slot, MD;  Location: [facility] OR;  Service:               Thoracic;  Laterality: Right; No date: LASIK; Bilateral 03/17/2022: LUNG LOBECTOMY (RIGHT UPPER LOBE); Right 03/17/2022: LYMPH NODE DISSECTION; Right     Comment:  Procedure: LYMPH NODE DISSECTION;  Surgeon: Loreli Slot, MD;  Location: [facility] OR;  Service: Thoracic;                Laterality: Right; 02/21/2018: PARTIAL MASTECTOMY WITH NEEDLE LOCALIZATION; Right     Comment:  Procedure: PARTIAL MASTECTOMY WITH NEEDLE LOCALIZATION;               Surgeon: Carolan Shiver, MD;  Location: [facility] ORS;               Service: General;  Laterality: Right; 02/11/2021: POLYPECTOMY; N/A     Comment:  Procedure: POLYPECTOMY;  Surgeon: Midge Minium, MD;                Location: [facility];  Service: Endoscopy;                Laterality: N/A; No date: ROTATOR CUFF REPAIR; Left 08/07/2019: SHOULDER ARTHROSCOPY WITH ROTATOR CUFF REPAIR AND OPEN  BICEPS TENODESIS;  Right     Comment:  Procedure: SHOULDER ARTHROSCOPY WITH DEBRIDEMENT,               DECOMPRESSION, ROTATOR CUFF REPAIR AND BICEPS TENODESIS.               - RNFA;  Surgeon: Christena Flake, MD;  Location: [facility] ORS;              Service: Orthopedics;  Laterality: Right; No date: TOE SURGERY; Right     Comment:  has pin and plate in it 81/19/1478: TRANSURETHRAL RESECTION OF BLADDER TUMOR; N/A     Comment:  Procedure: TRANSURETHRAL RESECTION OF BLADDER TUMOR               (TURBT);  Surgeon: Vanna Scotland, MD;  Location: [facility]               ORS;  Service: Urology;  Laterality: N/A; 03/02/2022: VIDEO BRONCHOSCOPY WITH ENDOBRONCHIAL ULTRASOUND; N/A     Comment:  Procedure: VIDEO BRONCHOSCOPY WITH ENDOBRONCHIAL               ULTRASOUND;  Surgeon: Salena Saner, MD;  Location:               [facility] ORS;  Service: Cardiopulmonary;  Laterality: N/A;  BMI    Body Mass Index: 25.97 kg/m      Reproductive/Obstetrics negative OB ROS                             Anesthesia Physical Anesthesia Plan  ASA: 3  Anesthesia Plan: General   Post-op Pain Management:    Induction: Intravenous  PONV Risk Score and Plan: 1 and Ondansetron and Dexamethasone  Airway Management Planned: Oral ETT  Additional Equipment:   Intra-op Plan:   Post-operative Plan: Extubation in OR  Informed Consent: I have reviewed the patients History and Physical, chart, labs and discussed the procedure including the risks, benefits and alternatives for the proposed anesthesia with the patient or authorized representative who has indicated his/her understanding and acceptance.     Dental Advisory Given  Plan Discussed with: CRNA and Surgeon  Anesthesia Plan Comments:        Anesthesia Quick Evaluation

## 2023-07-07 NOTE — Interval H&P Note (Signed)
Michelle Moore. [name] presented today for surgery, with the diagnosis of RIGHT MIDDLE LOBE COLLAPSE, QUERY RECURRENT TUMOR.  The various methods of treatment have been discussed with the patient and family. After consideration of risks, benefits and other options for treatment, the patient has consented to   Procedure(s): Bronchoscopy with endobronchial ultrasound biopsies as needed- lat  as a surgical intervention.  The patient's history has been reviewed, patient examined, no change in status, stable for surgery.  I have reviewed the patient's chart and labs.  Questions were answered to the patient's satisfaction.  Benefits, limitations and potential complications of the procedure were discussed with the patient/family.  Complications from bronchoscopy are rare and most often minor, but if they occur they may include breathing difficulty, vocal cord spasm, hoarseness, slight fever, vomiting, dizziness, bronchospasm, infection, low blood oxygen, bleeding from biopsy site, or an allergic reaction to medications.  It is uncommon for patients to experience other more serious complications for example: Collapsed lung requiring chest tube placement, respiratory failure, heart attack and/or cardiac arrhythmia.  Michelle Shelter, MD Advanced Bronchoscopy PCCM [facility]    *This note was dictated using voice recognition software/Dragon.  Despite best efforts to proofread, errors can occur which can change the meaning. Any transcriptional errors that result from this process are unintentional and may not be fully corrected at the time of dictation.

## 2023-07-08 ENCOUNTER — Other Ambulatory Visit: Payer: Self-pay | Admitting: Physician Assistant

## 2023-07-10 ENCOUNTER — Encounter: Payer: Self-pay | Admitting: Pulmonary Disease

## 2023-07-10 DIAGNOSIS — M5416 Radiculopathy, lumbar region: Secondary | ICD-10-CM | POA: Diagnosis not present

## 2023-07-10 DIAGNOSIS — M5412 Radiculopathy, cervical region: Secondary | ICD-10-CM | POA: Diagnosis not present

## 2023-07-10 DIAGNOSIS — M542 Cervicalgia: Secondary | ICD-10-CM | POA: Diagnosis not present

## 2023-07-10 DIAGNOSIS — M48062 Spinal stenosis, lumbar region with neurogenic claudication: Secondary | ICD-10-CM | POA: Diagnosis not present

## 2023-07-10 DIAGNOSIS — Z79899 Other long term (current) drug therapy: Secondary | ICD-10-CM | POA: Diagnosis not present

## 2023-07-10 DIAGNOSIS — M47812 Spondylosis without myelopathy or radiculopathy, cervical region: Secondary | ICD-10-CM | POA: Diagnosis not present

## 2023-07-11 DIAGNOSIS — M47812 Spondylosis without myelopathy or radiculopathy, cervical region: Secondary | ICD-10-CM | POA: Diagnosis not present

## 2023-07-24 ENCOUNTER — Encounter: Payer: Self-pay | Admitting: Physician Assistant

## 2023-07-24 ENCOUNTER — Encounter: Payer: Self-pay | Admitting: Pulmonary Disease

## 2023-07-24 ENCOUNTER — Other Ambulatory Visit: Payer: Self-pay | Admitting: Physician Assistant

## 2023-07-24 DIAGNOSIS — J3089 Other allergic rhinitis: Secondary | ICD-10-CM

## 2023-07-25 ENCOUNTER — Other Ambulatory Visit: Payer: Self-pay

## 2023-07-28 DIAGNOSIS — M47812 Spondylosis without myelopathy or radiculopathy, cervical region: Secondary | ICD-10-CM | POA: Diagnosis not present

## 2023-07-31 ENCOUNTER — Ambulatory Visit: Payer: Medicare HMO | Admitting: Physician Assistant

## 2023-08-02 ENCOUNTER — Ambulatory Visit: Payer: Medicare HMO | Admitting: Pulmonary Disease

## 2023-08-04 ENCOUNTER — Other Ambulatory Visit: Payer: Self-pay | Admitting: Internal Medicine

## 2023-08-04 DIAGNOSIS — F411 Generalized anxiety disorder: Secondary | ICD-10-CM

## 2023-08-08 ENCOUNTER — Inpatient Hospital Stay: Payer: Medicare HMO | Attending: Oncology | Admitting: Oncology

## 2023-08-08 ENCOUNTER — Encounter: Payer: Self-pay | Admitting: Oncology

## 2023-08-08 VITALS — BP 114/57 | HR 74 | Temp 98.0°F | Resp 16 | Wt 133.0 lb

## 2023-08-08 DIAGNOSIS — M8580 Other specified disorders of bone density and structure, unspecified site: Secondary | ICD-10-CM | POA: Insufficient documentation

## 2023-08-08 DIAGNOSIS — Z85118 Personal history of other malignant neoplasm of bronchus and lung: Secondary | ICD-10-CM | POA: Diagnosis not present

## 2023-08-08 DIAGNOSIS — Z86000 Personal history of in-situ neoplasm of breast: Secondary | ICD-10-CM | POA: Diagnosis not present

## 2023-08-08 DIAGNOSIS — Z9221 Personal history of antineoplastic chemotherapy: Secondary | ICD-10-CM | POA: Insufficient documentation

## 2023-08-08 DIAGNOSIS — Z87891 Personal history of nicotine dependence: Secondary | ICD-10-CM | POA: Insufficient documentation

## 2023-08-08 DIAGNOSIS — C3491 Malignant neoplasm of unspecified part of right bronchus or lung: Secondary | ICD-10-CM | POA: Diagnosis not present

## 2023-08-08 DIAGNOSIS — Z923 Personal history of irradiation: Secondary | ICD-10-CM | POA: Insufficient documentation

## 2023-08-08 NOTE — Progress Notes (Signed)
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  INTERVAL HISTORY: Patient returns to clinic today for routine evaluation and follow-up of her bronchoscopy.  No malignancy was noted in mass seen on CT scan was revealed to be a mucous plug.  She currently feels well. She continues to have a mild peripheral neuropathy, but no other neurologic complaints.  She continues to tolerate anastrozole without significant side effects. She denies any recent fevers or illnesses. She has a good appetite and denies weight loss.  She denies any chest pain, shortness of breath, cough, or hemoptysis.  She denies any nausea, vomiting, constipation, or diarrhea.  Patient offers no specific complaints today.  REVIEW OF SYSTEMS:   Review of Systems  Constitutional: Negative.  Negative for fever, malaise/fatigue and weight loss.  Respiratory: Negative.  Negative for cough, hemoptysis and shortness of breath.   Cardiovascular: Negative.  Negative for chest pain and leg swelling.  Gastrointestinal: Negative.  Negative for abdominal pain and heartburn.  Genitourinary:  Negative for dysuria and frequency.  Musculoskeletal: Negative.  Negative for back pain and myalgias.  Skin: Negative.  Negative for rash.  Neurological:  Positive for sensory change. Negative for dizziness, focal weakness and weakness.  Psychiatric/Behavioral: Negative.  The patient is not nervous/anxious.     As per HPI. Otherwise, a complete review of systems is  negative.  PAST MEDICAL HISTORY: Past Medical History:  Diagnosis Date   Adenoma of right adrenal gland 12/07/2021   a.) CT chest 12/07/2021; measured 2.3 cm; not FDG avid on 01/06/2022 PET CT.   Anxiety    a.) uses BZO (alprazolam) PRN   Aortic atherosclerosis (HCC)    Arthritis    Bilateral carotid artery stenosis    Bladder mass 12/07/2021   a.) CT abd 12/07/2021 --> 9 x 8 mm nodular enhancing focus at the base of the bladder; suspicious for urothelial neoplasm; b.) s/p TURBT 05/02/2022 --> pathology (-) for high grade dysplasia/malignancy   Cervical spondylosis without myelopathy    a.) s/p ACDF C5-C7   COPD (chronic obstructive pulmonary disease) (HCC)    Coronary artery disease 10/16/2009   a.) LHC/PCI 10/16/2009: EF 60%; 50% OM1, 99% dRCA (3.0 x 18 mm Xience V DES). b.) LHC 07/31/2014: EF 65%; 40% pLAD, 20% pLCx, 40% OM1, 30% oRCA, 50% pRCA, 20% mRCA, 10% ISR stent to dRCA; further intervention deferred opting for med. mgmt.   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 b.) s/p lumpectomy, adjuvant XRT; currently on extended AI (anastrozole) therapy   Essential hypertension    GERD (gastroesophageal reflux disease)    Hepatic steatosis    History of 2019 novel coronavirus disease (COVID-19) 04/2023   HLD (hyperlipidemia)    Left thyroid nodule 12/07/2021   a.) CT chest 12/07/2021: 2.6 cm; b.) CT chest 06/05/2023: hypoattenuating --> 2.5 cm   Long term current use of immunosuppressive  drug    a.) MTX for RA.   OSA on CPAP    currently not using   Osteopenia    Personal history of radiation therapy    Pneumonia    PRES (posterior reversible encephalopathy syndrome) 07/14/2014   Pulmonary nodules 11/08/2021   a.) CXR 11/08/2021: 18mm nodular density RUL. b.) CT chest 12/07/2021: 1.9 x 1.4 x 2.5  spiculated lesion anterior RUL. c.) PET CT 01/06/2022: 2.2 x 1.9 spiculated anterior RUL mass (SUV max 16.4); d.) Bx (+) NSCLC, favoring SCC with tumor necrosis; p40/CK7/GATA3 (+), TTF-1 (-)   Rheumatoid arthritis (HCC)    a.) on MTX   Seasonal allergies    Seizures (HCC) 2014   a.) x 1 episode; etiology never determined.   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   Tubular adenoma     PAST SURGICAL HISTORY: Past Surgical History:  Procedure Laterality Date   ABDOMINAL HYSTERECTOMY     ANTERIOR CERVICAL DECOMP/DISCECTOMY FUSION N/A 01/20/2014   Procedure: ANTERIOR CERVICAL DECOMPRESSION/DISCECTOMY FUSION 2 LEVELS cervical five/six six Arline Asp;  Surgeon: Cristi Loron, MD;  Location: [facility];  Service: Neurosurgery;  Laterality: N/A;   APPENDECTOMY     BACK SURGERY     lumbar   BLADDER INSTILLATION N/A 05/02/2022   Procedure: BLADDER INSTILLATION OF GEMCITABINE;  Surgeon: Vanna Scotland, MD;  Location: [facility];  Service: Urology;  Laterality: N/A;   BREAST BIOPSY Right 02/01/2018   x shape, DCIS    BREAST BIOPSY  01/16/2019   coil, BENIGN MAMMARY TISSUE WITH MODERATE STROMAL FIBROSIS AND COARSE DYSTROPHIC CALCIFICATIONS of the RIGHT breast   BREAST LUMPECTOMY Right 03/07/2018   Procedure: BREAST LUMPECTOMY/ RE EXCISION;  Surgeon: Carolan Shiver, MD;  Location: [facility];  Service: General;  Laterality: Right;   CARDIAC CATHETERIZATION Left 07/31/2014   Procedure: CARDIAC CATHETERIZATION; Location: [facility]; Surgeon: Arnoldo Hooker, MD   COLONOSCOPY WITH PROPOFOL N/A 02/11/2021   Procedure: COLONOSCOPY WITH PROPOFOL;  Surgeon: Midge Minium, MD;  Location: [facility];  Service: Endoscopy;  Laterality: N/A;  Requests Early   CORONARY ANGIOPLASTY WITH STENT PLACEMENT Left 10/16/2009   Procedure: CORONARY ANGIOPLASTY WITH STENT PLACEMENT; Location: [facility]; Surgeon: Rudean Hitt, MD   CYSTOSCOPY W/ RETROGRADES Bilateral 05/02/2022   Procedure: CYSTOSCOPY WITH RETROGRADE PYELOGRAM;  Surgeon: Vanna Scotland, MD;  Location: [facility];  Service: Urology;  Laterality: Bilateral;   ENDOBRONCHIAL ULTRASOUND Right 07/07/2023   Procedure: ENDOBRONCHIAL ULTRASOUND;  Surgeon: Salena Saner, MD;  Location: [facility];  Service: Pulmonary;  Laterality: Right;   ESOPHAGOGASTRODUODENOSCOPY (EGD) WITH PROPOFOL N/A 10/07/2022   Procedure: ESOPHAGOGASTRODUODENOSCOPY (EGD) WITH PROPOFOL;  Surgeon: Midge Minium, MD;  Location: [facility];  Service: Endoscopy;  Laterality: N/A;  sleep apnea   FLEXIBLE BRONCHOSCOPY Right 07/07/2023   Procedure: FLEXIBLE BRONCHOSCOPY;  Surgeon: Salena Saner, MD;  Location: [facility];  Service: Pulmonary;  Laterality: Right;   INTERCOSTAL NERVE BLOCK Right 03/17/2022   Procedure: INTERCOSTAL NERVE BLOCK;  Surgeon: Loreli Slot, MD;  Location: [facility] OR;  Service: Thoracic;  Laterality: Right;   LASIK Bilateral    LUNG LOBECTOMY (RIGHT UPPER LOBE) Right  03/17/2022   LYMPH NODE DISSECTION Right 03/17/2022   Procedure: LYMPH NODE DISSECTION;  Surgeon: Loreli Slot, MD;  Location: [facility] OR;  Service: Thoracic;  Laterality: Right;   PARTIAL MASTECTOMY WITH NEEDLE LOCALIZATION Right 02/21/2018   Procedure: PARTIAL MASTECTOMY WITH NEEDLE LOCALIZATION;  Surgeon: Carolan Shiver, MD;  Location: [facility];  Service: General;  Laterality: Right;   POLYPECTOMY N/A 02/11/2021   Procedure: POLYPECTOMY;  Surgeon: Midge Minium, MD;  Location: [facility];  Service: Endoscopy;  Laterality: N/A;   ROTATOR CUFF REPAIR Left    SHOULDER ARTHROSCOPY WITH ROTATOR CUFF REPAIR AND OPEN BICEPS  TENODESIS Right 08/07/2019   Procedure: SHOULDER ARTHROSCOPY WITH DEBRIDEMENT, DECOMPRESSION, ROTATOR CUFF REPAIR AND BICEPS TENODESIS. - RNFA;  Surgeon: Christena Flake, MD;  Location: [facility];  Service: Orthopedics;  Laterality: Right;   TOE SURGERY Right    has pin and plate in it   TRANSURETHRAL RESECTION OF BLADDER TUMOR N/A 05/02/2022   Procedure: TRANSURETHRAL RESECTION OF BLADDER TUMOR (TURBT);  Surgeon: Vanna Scotland, MD;  Location: [facility];  Service: Urology;  Laterality: N/A;   VIDEO BRONCHOSCOPY WITH ENDOBRONCHIAL ULTRASOUND N/A 03/02/2022   Procedure: VIDEO BRONCHOSCOPY WITH ENDOBRONCHIAL ULTRASOUND;  Surgeon: Salena Saner, MD;  Location: [facility];  Service: Cardiopulmonary;  Laterality: N/A;    FAMILY HISTORY: Family History  Problem Relation Age of Onset   Breast cancer Cousin 17       bilateral at 57 and 15; daughter of maternal aunt who was unaffected   Lung cancer Maternal Aunt        dx 64s; deceased 84s; smoker   Heart attack Father        deceased 105   Stomach cancer Maternal Grandmother     ADVANCED DIRECTIVES (Y/N):  N  HEALTH MAINTENANCE: Social History   Tobacco Use   Smoking status: Former    Current packs/day: 0.00    Average packs/day: 1 pack/day for 30.0 years (30.0 ttl pk-yrs)    Types: Cigarettes    Start date: 09/12/1985    Quit date: 09/13/2015    Years since quitting: 7.9   Smokeless tobacco: Never  Vaping Use   Vaping status: Never Used  Substance Use Topics   Alcohol use: Yes    Comment: occasional   Drug use: No     Colonoscopy:  PAP:  Bone density:  Lipid panel:  Allergies  Allergen Reactions   Other     BANDAIDS-OF LEFT ON FOR AN EXTENDED PERIOD OF TIME  Patient states latex rubber gloves are ok    Current Outpatient Medications  Medication Sig Dispense Refill   albuterol (PROVENTIL) (2.5 MG/3ML) 0.083% nebulizer solution Take 3 mLs (2.5 mg total) by nebulization every 6 (six) hours as needed for wheezing or  shortness of breath. 125 mL 2   albuterol (VENTOLIN HFA) 108 (90 Base) MCG/ACT inhaler Inhale 1-2 puffs into the lungs every 6 (six) hours as needed for wheezing or shortness of breath. 1 g 3   alendronate (FOSAMAX) 70 MG tablet TAKE 1 TABLET BY MOUTH ONCE A WEEK. TAKE WITH A FULL GLASS OF WATER ON AN EMPTY STOMACH. STAND OR SIT UPRIGHT FOR 1 HOUR AFTER TAKING 12 tablet 1   ALPRAZolam (XANAX) 0.25 MG tablet TAKE ONE (1) TO TWO (2) TABLET BY MOUTH USE EVERY NIGHT 60 tablet 0   aspirin-acetaminophen-caffeine (EXCEDRIN MIGRAINE) 250-250-65 MG tablet Take by mouth every 6 (six) hours as needed for headache.     fluticasone (FLONASE) 50 MCG/ACT nasal  spray Place 2 sprays into both nostrils daily as needed for allergies. 60 mL 1   fluticasone-salmeterol (ADVAIR) 250-50 MCG/ACT AEPB INHALE 1 PUFF TWICE DAILY IN THE MORNING AND AT BEDTIME 180 each 3   folic acid (FOLVITE) 1 MG tablet Take 2 mg by mouth in the morning.     gabapentin (NEURONTIN) 300 MG capsule Take 1 capsule (300 mg total) by mouth 2 (two) times daily. (Patient taking differently: Take 100 mg by mouth 2 (two) times daily.) 60 capsule 6   methotrexate (RHEUMATREX) 2.5 MG tablet Take 20 mg by mouth once a week. WEDNESDAY     montelukast (SINGULAIR) 10 MG tablet TAKE (1) TABLET BY MOUTH  AT BEDTIME 90 tablet 1   omeprazole (PRILOSEC) 40 MG capsule TAKE (1) CAPSULE BY MOUTH EVERY DAY 90 capsule 3   potassium chloride (KLOR-CON) 10 MEQ tablet TAKE (1) TABLET BY MOUTH EVERY DAY 90 tablet 1   rosuvastatin (CRESTOR) 40 MG tablet Take 40 mg by mouth every evening.     sertraline (ZOLOFT) 100 MG tablet TAKE (1) TABLET BY MOUTH EVERY DAY 90 tablet 1   traMADol (ULTRAM) 50 MG tablet Take 1-2 tablets (50-100 mg total) by mouth 3 (three) times daily as needed. 60 tablet 0   No current facility-administered medications for this visit.    OBJECTIVE: Vitals:   08/08/23 1112  BP: (!) 114/57  Pulse: 74  Resp: 16  Temp: 98 F (36.7 C)  SpO2: 99%       Body mass index is 25.97 kg/m.    ECOG FS:0 - Asymptomatic  General: Well-developed, well-nourished, no acute distress. Eyes: Pink conjunctiva, anicteric sclera. HEENT: Normocephalic, moist mucous membranes. Lungs: No audible wheezing or coughing. Heart: Regular rate and rhythm. Abdomen: Soft, nontender, no obvious distention. Musculoskeletal: No edema, cyanosis, or clubbing. Neuro: Alert, answering all questions appropriately. Cranial nerves grossly intact. Skin: No rashes or petechiae noted. Psych: Normal affect.  LAB RESULTS:  Lab Results  Component Value Date   NA 139 06/22/2023   K 4.9 06/22/2023   CL 106 06/22/2023   CO2 27 06/22/2023   GLUCOSE 116 (H) 06/22/2023   BUN 15 06/22/2023   CREATININE 0.79 06/22/2023   CALCIUM 8.9 06/22/2023   PROT 7.0 06/22/2023   ALBUMIN 3.8 06/22/2023   AST 20 06/22/2023   ALT 16 06/22/2023   ALKPHOS 76 06/22/2023   BILITOT 0.3 06/22/2023   GFRNONAA >60 06/22/2023   GFRAA >60 04/09/2020    Lab Results  Component Value Date   WBC 7.4 06/22/2023   NEUTROABS 4.7 06/22/2023   HGB 12.1 06/22/2023   HCT 38.3 06/22/2023   MCV 96.7 06/22/2023   PLT 205 06/22/2023     STUDIES: No results found.  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  PLAN:    Stage IIIa squamous cell carcinoma of the right upper lung: Final pathology results from her right upper lobectomy on March 17, 2022 revealed a 4.1 cm lesion that was adherent to the chest wall, but not invading.  She was also noted to have 1 of 33 lymph nodes positive for disease.  This increased her to a stage IIIa. MRI of the brain on April 21, 2022 reviewed independently with no obvious evidence of metastatic disease. Patient completed 4 cycles of adjuvant chemotherapy using cisplatin and Taxotere on June 22, 2022.  Her most recent imaging on June 16, 2023 reviewed independently with a new  obstructing right middle  lobe bronchus lesion, but subsequent bronchoscopy only revealed mucous plug without malignancy.  PET scan is not needed.  Will repeat CT scan in 3 months to assess for interval change and then transition to imaging for evaluation every 6 months.     DCIS, right breast: Patient underwent lumpectomy followed by adjuvant XRT completing in September 2019.  Because there was no invasive component on her pathology, she did not require adjuvant chemotherapy. Patient could not tolerate tamoxifen or letrozole secondary to worsening joint pain, therefore was switched to anastrozole.  Patient completed 5 years of treatment in September 2024.  Her most recent mammogram on March 21, 2023 was reported as BI-RADS 1.  Repeat in July 2025. Osteopenia: Patient's most recent bone mineral density on March 21, 2023 reported T-score of -2.5 which continues to trend down over the past several years.  Continue alendronate, but consider switching to Prolia in the near future.  Patient has also been instructed to continue calcium and vitamin D supplementation.  Repeat bone mineral density in July 2025.   Reflux/abdominal pain: Patient does not complain of this today.  Continue omeprazole.   Anemia: Resolved. Thyroid nodule: Will get thyroid ultrasound.  Patient request to have this done in January 2025.    Jeralyn Ruths, MD 08/08/2023 12:42 PM   Cancer Staging  Ductal carcinoma in situ (DCIS) of right breast Staging form: Breast, AJCC 8th Edition - Clinical: Stage 0 (cTis (DCIS), cN0, cM0, ER+, PR-, HER2-) - Signed by Jeralyn Ruths, MD on 03/18/2018 Nuclear grade: G3 Laterality: Right  Squamous cell carcinoma of right lung (HCC) Staging form: Lung, AJCC 8th Edition - Pathologic stage from 04/07/2022: Stage IIIA (pT3, pN1, cM0) - Signed by Jeralyn Ruths, MD on 04/07/2022

## 2023-08-08 NOTE — Progress Notes (Signed)
Patient has been feeling more fatigued.

## 2023-08-09 ENCOUNTER — Other Ambulatory Visit: Payer: Self-pay

## 2023-08-15 ENCOUNTER — Other Ambulatory Visit: Payer: Medicare HMO | Admitting: Urology

## 2023-08-15 DIAGNOSIS — M5416 Radiculopathy, lumbar region: Secondary | ICD-10-CM | POA: Diagnosis not present

## 2023-08-15 DIAGNOSIS — M48062 Spinal stenosis, lumbar region with neurogenic claudication: Secondary | ICD-10-CM | POA: Diagnosis not present

## 2023-08-15 DIAGNOSIS — Z79899 Other long term (current) drug therapy: Secondary | ICD-10-CM | POA: Diagnosis not present

## 2023-08-15 DIAGNOSIS — M47812 Spondylosis without myelopathy or radiculopathy, cervical region: Secondary | ICD-10-CM | POA: Diagnosis not present

## 2023-08-15 DIAGNOSIS — M542 Cervicalgia: Secondary | ICD-10-CM | POA: Diagnosis not present

## 2023-08-15 DIAGNOSIS — M5412 Radiculopathy, cervical region: Secondary | ICD-10-CM | POA: Diagnosis not present

## 2023-08-16 ENCOUNTER — Other Ambulatory Visit: Payer: Medicare HMO | Admitting: Urology

## 2023-08-16 DIAGNOSIS — N3289 Other specified disorders of bladder: Secondary | ICD-10-CM

## 2023-08-17 ENCOUNTER — Ambulatory Visit: Payer: Medicare HMO | Admitting: Pulmonary Disease

## 2023-08-17 ENCOUNTER — Encounter: Payer: Self-pay | Admitting: Pulmonary Disease

## 2023-08-17 VITALS — BP 118/78 | HR 79 | Temp 97.3°F | Ht 60.0 in | Wt 134.2 lb

## 2023-08-17 DIAGNOSIS — C3491 Malignant neoplasm of unspecified part of right bronchus or lung: Secondary | ICD-10-CM

## 2023-08-17 DIAGNOSIS — J449 Chronic obstructive pulmonary disease, unspecified: Secondary | ICD-10-CM | POA: Diagnosis not present

## 2023-08-17 DIAGNOSIS — J9819 Other pulmonary collapse: Secondary | ICD-10-CM

## 2023-08-17 NOTE — Progress Notes (Signed)
Subjective:    Patient ID: Michelle Moore, female    DOB: 18-Mar-1956, 67 y.o.   MRN: [id number]  Patient Care Team: Alan Ripper as PCP - General (Physician Assistant) Glory Buff, RN as Oncology Nurse Navigator Orlie Dakin, Tollie Pizza, MD as Consulting Physician (Oncology) Salena Saner, MD as Consulting Physician (Pulmonary Disease)  Chief Complaint  Patient presents with   Follow-up    DOE. No wheezing or cough. Bronchoscopy on 10/25.     BACKGROUND/INTERVAL: Michelle Moore is a 67 year old former smoker with a 30-pack-year history of smoking who presents for follow-up on the issue of COPD and shortness of breath.  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 Dorris Fetch on 17 March 2022.  She had 1 out of 18 nodes positive for carcinoma.  Staging was modified to 3A.  Surveillance CT of 05 June 2023 showed bronchus with increasing volume loss in the right middle lobe she underwent bronchoscopy 07 July 2023 that showed distortion of the right mainstem bronchus postsurgical resection and mucus impaction/plugging of the right middle lobe.  Patient follows after that procedure.  Tolerated procedure well.   HPI Discussed the use of AI scribe software for clinical note transcription with the patient, who gave verbal consent to proceed.  History of Present Illness   The patient presents with a persistent throat discomfort, described as a sensation of something lodged in the throat prior to swallowing. Despite previous evaluations, the cause remains undetermined. The patient has a known history of acid reflux and is currently on Prilosec.  In addition to the throat discomfort, the patient has been receiving injections in the neck, with a third injection planned to freeze the nerve for approximately ten months. The patient reports numbness radiating up the side of the face and down the neck, which she suspects may be  related to the throat discomfort.  The patient also has a history of respiratory issues, which are exacerbated by cold weather and overexertion. However, she denies any current cough or other respiratory symptoms. The patient does not feel well in general, but attributes this to age.  The patient has been using an Acapella flutter valve, sporadically, to manage mucus accumulation in the throat, which she reports as effective. She has not noticed any coloration in the phlegm. The patient is due for a chest CT scan in February.     Review of Systems A 10 point review of systems was performed and it is as noted above otherwise negative.   Patient Active Problem List   Diagnosis Date Noted   Atelectasis, right 07/07/2023   Dysphagia 10/07/2022   Abdominal pain, epigastric 10/07/2022   S/P lobectomy of lung 03/17/2022   S/P robot-assisted surgical procedure 03/17/2022   Squamous cell carcinoma of right lung (HCC) 02/15/2022   S/P cardiac catheterization 04/06/2021   Osteoporosis without current pathological fracture 04/06/2021   Aromatase inhibitor use 04/06/2021   Special screening for malignant neoplasms, colon    Family history of colon cancer    Polyp of descending colon    Cough 08/28/2020   Hypokalemia 07/25/2020   Urinary tract infection without hematuria 07/25/2020   Recurrent displacement of lumbar disc 04/08/2020   Essential hypertension 01/22/2020   Degenerative tear of glenoid labrum of right shoulder 07/19/2019   Nontraumatic complete tear of right rotator cuff 07/19/2019   Rotator cuff tendinitis, right 07/19/2019   Tendinitis of upper biceps tendon of right shoulder  07/19/2019   Encounter for general adult medical examination with abnormal findings 05/29/2019   Dysuria 05/29/2019   Acute non-recurrent pansinusitis 06/01/2018   Perennial allergic rhinitis 06/01/2018   Generalized anxiety disorder 06/01/2018   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    Coronary artery disease 11/09/2017   Mixed hyperlipidemia 11/09/2017   Bilateral carotid artery stenosis 08/14/2014   Gastroesophageal reflux disease with esophagitis 08/14/2014   OSA on CPAP 07/14/2014   Weight loss of more than 10% body weight 07/14/2014   Cervical spondylosis with radiculopathy 01/20/2014   Cervical spondylosis without myelopathy 01/20/2014    Social History   Tobacco Use   Smoking status: Former    Current packs/day: 0.00    Average packs/day: 1 pack/day for 30.0 years (30.0 ttl pk-yrs)    Types: Cigarettes    Start date: 09/12/1985    Quit date: 09/13/2015    Years since quitting: 7.9   Smokeless tobacco: Never  Substance Use Topics   Alcohol use: Yes    Comment: occasional    Allergies  Allergen Reactions   Other     BANDAIDS-OF LEFT ON FOR AN EXTENDED PERIOD OF TIME  Patient states latex rubber gloves are ok    Current Meds  Medication Sig   albuterol (VENTOLIN HFA) 108 (90 Base) MCG/ACT inhaler Inhale 1-2 puffs into the lungs every 6 (six) hours as needed for wheezing or shortness of breath.   alendronate (FOSAMAX) 70 MG tablet TAKE 1 TABLET BY MOUTH ONCE A WEEK. TAKE WITH A FULL GLASS OF WATER ON AN EMPTY STOMACH. STAND OR SIT UPRIGHT FOR 1 HOUR AFTER TAKING   ALPRAZolam (XANAX) 0.25 MG tablet TAKE ONE (1) TO TWO (2) TABLET BY MOUTH USE EVERY NIGHT   aspirin-acetaminophen-caffeine (EXCEDRIN MIGRAINE) 250-250-65 MG tablet Take by mouth every 6 (six) hours as needed for headache.   fluticasone (FLONASE) 50 MCG/ACT nasal spray Place 2 sprays into both nostrils daily as needed for allergies.   fluticasone-salmeterol (ADVAIR) 250-50 MCG/ACT AEPB INHALE 1 PUFF TWICE DAILY IN THE MORNING AND AT BEDTIME   folic acid (FOLVITE) 1 MG tablet Take 2 mg by mouth in the morning.   gabapentin (NEURONTIN) 300 MG capsule Take 1 capsule (300 mg total) by mouth 2 (two) times daily. (Patient taking differently: Take 100 mg by mouth 2 (two) times daily.)   methotrexate  (RHEUMATREX) 2.5 MG tablet Take 20 mg by mouth once a week. WEDNESDAY   montelukast (SINGULAIR) 10 MG tablet TAKE (1) TABLET BY MOUTH  AT BEDTIME   omeprazole (PRILOSEC) 40 MG capsule TAKE (1) CAPSULE BY MOUTH EVERY DAY   potassium chloride (KLOR-CON) 10 MEQ tablet TAKE (1) TABLET BY MOUTH EVERY DAY   rosuvastatin (CRESTOR) 40 MG tablet Take 40 mg by mouth every evening.   sertraline (ZOLOFT) 100 MG tablet TAKE (1) TABLET BY MOUTH EVERY DAY   traMADol (ULTRAM) 50 MG tablet Take 1-2 tablets (50-100 mg total) by mouth 3 (three) times daily as needed.    Immunization History  Administered Date(s) Administered   Fluad Trivalent(High Dose 65+) 06/22/2023   Influenza Inj Mdck Quad Pf 04/24/2020, 06/03/2021, 07/25/2022   Influenza,inj,Quad PF,6+ Mos 08/02/2018   Influenza,inj,quad, With Preservative 06/17/2019   Influenza-Unspecified 06/17/2019   Moderna SARS-COV2 Booster Vaccination 08/04/2020   Moderna Sars-Covid-2 Vaccination 11/13/2019, 12/11/2019   Pneumococcal Conjugate-13 06/17/2019   Pneumococcal Polysaccharide-23 06/17/2019        Objective:     BP 118/78 (BP Location: Left Arm, Cuff Size:  Normal)   Pulse 79   Temp (!) 97.3 F (36.3 C)   Ht 5' (1.524 m)   Wt 134 lb 3.2 oz (60.9 kg)   SpO2 98%   BMI 26.21 kg/m   SpO2: 98 % O2 Device: None (Room air)  GENERAL: Well-developed, well-nourished woman, no acute distress. Fully ambulatory.  No conversational dyspnea. HEAD: Normocephalic, atraumatic.  EYES: Pupils equal, round, reactive to light.  No scleral icterus.  MOUTH: Oral mucosa moist.  No thrush. NECK: Supple. No thyromegaly. Trachea midline. No JVD.  No adenopathy. PULMONARY: Good air entry bilaterally.  No adventitious sounds. CARDIOVASCULAR: S1 and S2. Regular rate and rhythm.  No rubs, murmurs or gallops heard. ABDOMEN: Benign. MUSCULOSKELETAL: No joint deformity, no clubbing, no edema.  NEUROLOGIC: No overt focal deficit, no gait disturbance, speech is  fluent. SKIN: Intact,warm,dry. PSYCH: Normal mood, normal behavior.        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 Squamous cell carcinoma of right lung (HCC)  C34.91    Status post lobectomy Status post adjuvant chemo    3. Stage 1 mild COPD by GOLD classification (HCC)  J44.9      Discussion:    Chronic Obstructive Pulmonary Disease (COPD)   Mechanical issues in the right lung causing mucus accumulation. Uses a flutter valve to clear mucus but not consistently. Agreed to hold off on chest x-ray and proceed with chest CT scan in February.   - Use Acapella flutter valve at least twice daily   - Order chest CT in February   - Follow up visit after CT scan    Throat Discomfort   Persistent sensation before swallowing. History of acid reflux, currently on Prilosec. Possible contribution from cervical injections for a pinched nerve. No significant cough or colored phlegm.   - Continue Prilosec   - Monitor symptoms and follow up if this persists or worsens    Cervical Facet Syndrome   Undergoing serial injections for a pinched nerve in the neck, with radiation of numbness and pain up the side of the face. The third injection is scheduled to potentially provide relief for up to ten months. Discussed potential for physical therapy post-injection to delay recurrence of pain.   - Proceed with the third injection as scheduled   - Consider physical therapy post-injection to delay recurrence of pain    General Health Maintenance   Received flu shot.   - Continue routine vaccinations and health maintenance    Follow-up   - Schedule follow-up appointment after the chest CT in February.       Gailen Shelter, MD Advanced Bronchoscopy [facility]    *This note was generated using voice recognition software/Dragon and/or AI transcription program.   Despite best efforts to proofread, errors can occur which can change the meaning. Any transcriptional errors that result from this process are unintentional and may not be fully corrected at the time of dictation.

## 2023-08-17 NOTE — Patient Instructions (Signed)
VISIT SUMMARY:  During today's visit, we discussed your persistent throat discomfort, ongoing treatment for a pinched nerve in your neck, and your respiratory health. We also reviewed your general health maintenance and planned for future follow-ups.  YOUR PLAN:  -CHRONIC OBSTRUCTIVE PULMONARY DISEASE (COPD): COPD is a chronic lung condition that causes breathing difficulties. You have mechanical issues in your right lung causing mucus buildup. Continue using the Acapella flutter valve at least twice daily to help clear the mucus. We will hold off on a chest x-ray until after your scheduled chest CT scan in February.  -THROAT DISCOMFORT: You have a persistent sensation in your throat before swallowing, possibly related to your acid reflux or cervical pathology. Continue taking Prilosec as prescribed and monitor your symptoms. Follow up if they persist or worsen.  -CERVICAL FACET SYNDROME: Cervical Facet Syndrome is a condition where the joints in the neck cause pain and numbness. You are undergoing injections for a pinched nerve, with a third injection scheduled to provide relief for up to ten months. We discussed the potential for physical therapy after the injection to help delay the recurrence of pain.  -GENERAL HEALTH MAINTENANCE: You ar up to date on influenza vaccine. Continue with routine vaccinations and general health maintenance.  INSTRUCTIONS:  Please schedule a follow-up appointment after your chest CT scan in February.

## 2023-08-18 ENCOUNTER — Other Ambulatory Visit: Payer: Self-pay

## 2023-08-22 DIAGNOSIS — M47812 Spondylosis without myelopathy or radiculopathy, cervical region: Secondary | ICD-10-CM | POA: Diagnosis not present

## 2023-08-25 ENCOUNTER — Encounter: Payer: Self-pay | Admitting: Physician Assistant

## 2023-08-25 ENCOUNTER — Ambulatory Visit: Payer: Medicare HMO | Admitting: Physician Assistant

## 2023-08-25 VITALS — BP 120/68 | HR 73 | Temp 97.9°F | Resp 16 | Ht 60.0 in | Wt 135.6 lb

## 2023-08-25 DIAGNOSIS — E041 Nontoxic single thyroid nodule: Secondary | ICD-10-CM

## 2023-08-25 DIAGNOSIS — I700 Atherosclerosis of aorta: Secondary | ICD-10-CM | POA: Diagnosis not present

## 2023-08-25 DIAGNOSIS — Z0000 Encounter for general adult medical examination without abnormal findings: Secondary | ICD-10-CM

## 2023-08-25 DIAGNOSIS — M0579 Rheumatoid arthritis with rheumatoid factor of multiple sites without organ or systems involvement: Secondary | ICD-10-CM | POA: Diagnosis not present

## 2023-08-25 DIAGNOSIS — J449 Chronic obstructive pulmonary disease, unspecified: Secondary | ICD-10-CM

## 2023-08-25 DIAGNOSIS — C3491 Malignant neoplasm of unspecified part of right bronchus or lung: Secondary | ICD-10-CM

## 2023-08-25 NOTE — Progress Notes (Unsigned)
[facility] [address] [place], [place] [address]  Internal MEDICINE  Office Visit Note  Patient Name: Michelle Moore  [id number]  [id number]  Date of Service: 08/31/2023  Chief Complaint  Patient presents with   Medicare Wellness   Depression   Gastroesophageal Reflux   Hypertension   Hyperlipidemia   Quality Metric Gaps    Shingles Vaccine    HPI Michelle Moore presents for an annual well visit Well-appearing 67 y.o. female Routine CRC screening: UTD, due 2027 Routine mammogram: UTD, followed by oncology DEXA scan: UTD, done 2024 Labs: given lab slip New or worsening pain: has been getting injections in neck by Dr. Yves Dill Other concerns: CT showed possible thyroid cyst, Dr. Orlie Dakin ordered thyroid US but has not been scheduled yet. Has repeat CT in feb     08/25/2023    9:21 AM 07/25/2022    8:52 AM 07/19/2021    8:58 AM  MMSE - Mini Mental State Exam  Orientation to time 5 3 5   Orientation to Place 5 5 5   Registration 3 2 3   Attention/ Calculation 5 5 5   Recall 3 3 3   Language- name 2 objects 2 2 2   Language- repeat 1 1 1   Language- follow 3 step command 3 3 3   Language- read & follow direction 1 1 1   Write a sentence 1 1 1   Copy design 1 1 1   Total score 30 27 30     Functional Status Survey: Is the patient deaf or have difficulty hearing?: No Does the patient have difficulty seeing, even when wearing glasses/contacts?: No Does the patient have difficulty concentrating, remembering, or making decisions?: No Does the patient have difficulty walking or climbing stairs?: No Does the patient have difficulty dressing or bathing?: No Does the patient have difficulty doing errands alone such as visiting a doctor's office or shopping?: No     08/12/2022   10:45 AM 11/07/2022   11:49 AM 12/29/2022   11:16 AM 03/09/2023   11:06 AM 08/25/2023    9:21 AM  Fall Risk  Falls in the past year?  0 0 0 0  (RETIRED) Patient Fall Risk Level Low fall risk       Patient at Risk for Falls Due to   No Fall Risks         08/25/2023    9:21 AM  Depression screen PHQ 2/9  Decreased Interest 0  Down, Depressed, Hopeless 0  PHQ - 2 Score 0        No data to display            Current Medication: Outpatient Encounter Medications as of 08/25/2023  Medication Sig   albuterol (VENTOLIN HFA) 108 (90 Base) MCG/ACT inhaler Inhale 1-2 puffs into the lungs every 6 (six) hours as needed for wheezing or shortness of breath.   alendronate (FOSAMAX) 70 MG tablet TAKE 1 TABLET BY MOUTH ONCE A WEEK. TAKE WITH A FULL GLASS OF WATER ON AN EMPTY STOMACH. STAND OR SIT UPRIGHT FOR 1 HOUR AFTER TAKING   ALPRAZolam (XANAX) 0.25 MG tablet TAKE ONE (1) TO TWO (2) TABLET BY MOUTH USE EVERY NIGHT   aspirin-acetaminophen-caffeine (EXCEDRIN MIGRAINE) 250-250-65 MG tablet Take by mouth every 6 (six) hours as needed for headache.   fluticasone (FLONASE) 50 MCG/ACT nasal spray Place 2 sprays into both nostrils daily as needed for allergies.   fluticasone-salmeterol (ADVAIR) 250-50 MCG/ACT AEPB INHALE 1 PUFF TWICE DAILY IN THE MORNING AND AT BEDTIME   folic  acid (FOLVITE) 1 MG tablet Take 2 mg by mouth in the morning.   gabapentin (NEURONTIN) 300 MG capsule Take 1 capsule (300 mg total) by mouth 2 (two) times daily. (Patient taking differently: Take 100 mg by mouth 2 (two) times daily.)   methotrexate (RHEUMATREX) 2.5 MG tablet Take 20 mg by mouth once a week. WEDNESDAY   montelukast (SINGULAIR) 10 MG tablet TAKE (1) TABLET BY MOUTH  AT BEDTIME   omeprazole (PRILOSEC) 40 MG capsule TAKE (1) CAPSULE BY MOUTH EVERY DAY   potassium chloride (KLOR-CON) 10 MEQ tablet TAKE (1) TABLET BY MOUTH EVERY DAY   rosuvastatin (CRESTOR) 40 MG tablet Take 40 mg by mouth every evening.   sertraline (ZOLOFT) 100 MG tablet TAKE (1) TABLET BY MOUTH EVERY DAY   traMADol (ULTRAM) 50 MG tablet Take 1-2 tablets (50-100 mg total) by mouth 3 (three) times daily as needed.   albuterol (PROVENTIL)  (2.5 MG/3ML) 0.083% nebulizer solution Take 3 mLs (2.5 mg total) by nebulization every 6 (six) hours as needed for wheezing or shortness of breath.   [DISCONTINUED] prochlorperazine (COMPAZINE) 10 MG tablet Take 1 tablet (10 mg total) by mouth every 6 (six) hours as needed (Nausea or vomiting).   No facility-administered encounter medications on file as of 08/25/2023.    Surgical History: Past Surgical History:  Procedure Laterality Date   ABDOMINAL HYSTERECTOMY     ANTERIOR CERVICAL DECOMP/DISCECTOMY FUSION N/A 01/20/2014   Procedure: ANTERIOR CERVICAL DECOMPRESSION/DISCECTOMY FUSION 2 LEVELS cervical five/six six Arline Asp;  Surgeon: Cristi Loron, MD;  Location: [facility];  Service: Neurosurgery;  Laterality: N/A;   APPENDECTOMY     BACK SURGERY     lumbar   BLADDER INSTILLATION N/A 05/02/2022   Procedure: BLADDER INSTILLATION OF GEMCITABINE;  Surgeon: Vanna Scotland, MD;  Location: [facility];  Service: Urology;  Laterality: N/A;   BREAST BIOPSY Right 02/01/2018   x shape, DCIS    BREAST BIOPSY  01/16/2019   coil, BENIGN MAMMARY TISSUE WITH MODERATE STROMAL FIBROSIS AND COARSE DYSTROPHIC CALCIFICATIONS of the RIGHT breast   BREAST LUMPECTOMY Right 03/07/2018   Procedure: BREAST LUMPECTOMY/ RE EXCISION;  Surgeon: Carolan Shiver, MD;  Location: [facility];  Service: General;  Laterality: Right;   CARDIAC CATHETERIZATION Left 07/31/2014   Procedure: CARDIAC CATHETERIZATION; Location: [facility]; Surgeon: Arnoldo Hooker, MD   COLONOSCOPY WITH PROPOFOL N/A 02/11/2021   Procedure: COLONOSCOPY WITH PROPOFOL;  Surgeon: Midge Minium, MD;  Location: [facility];  Service: Endoscopy;  Laterality: N/A;  Requests Early   CORONARY ANGIOPLASTY WITH STENT PLACEMENT Left 10/16/2009   Procedure: CORONARY ANGIOPLASTY WITH STENT PLACEMENT; Location: [facility]; Surgeon: Rudean Hitt, MD   CYSTOSCOPY W/ RETROGRADES Bilateral 05/02/2022   Procedure: CYSTOSCOPY WITH RETROGRADE PYELOGRAM;  Surgeon:  Vanna Scotland, MD;  Location: [facility];  Service: Urology;  Laterality: Bilateral;   ENDOBRONCHIAL ULTRASOUND Right 07/07/2023   Procedure: ENDOBRONCHIAL ULTRASOUND;  Surgeon: Salena Saner, MD;  Location: [facility];  Service: Pulmonary;  Laterality: Right;   ESOPHAGOGASTRODUODENOSCOPY (EGD) WITH PROPOFOL N/A 10/07/2022   Procedure: ESOPHAGOGASTRODUODENOSCOPY (EGD) WITH PROPOFOL;  Surgeon: Midge Minium, MD;  Location: [facility];  Service: Endoscopy;  Laterality: N/A;  sleep apnea   FLEXIBLE BRONCHOSCOPY Right 07/07/2023   Procedure: FLEXIBLE BRONCHOSCOPY;  Surgeon: Salena Saner, MD;  Location: [facility];  Service: Pulmonary;  Laterality: Right;   INTERCOSTAL NERVE BLOCK Right 03/17/2022   Procedure: INTERCOSTAL NERVE BLOCK;  Surgeon: Loreli Slot, MD;  Location: [facility] OR;  Service: Thoracic;  Laterality: Right;  LASIK Bilateral    LUNG LOBECTOMY (RIGHT UPPER LOBE) Right 03/17/2022   LYMPH NODE DISSECTION Right 03/17/2022   Procedure: LYMPH NODE DISSECTION;  Surgeon: Loreli Slot, MD;  Location: [facility] OR;  Service: Thoracic;  Laterality: Right;   PARTIAL MASTECTOMY WITH NEEDLE LOCALIZATION Right 02/21/2018   Procedure: PARTIAL MASTECTOMY WITH NEEDLE LOCALIZATION;  Surgeon: Carolan Shiver, MD;  Location: [facility];  Service: General;  Laterality: Right;   POLYPECTOMY N/A 02/11/2021   Procedure: POLYPECTOMY;  Surgeon: Midge Minium, MD;  Location: [facility];  Service: Endoscopy;  Laterality: N/A;   ROTATOR CUFF REPAIR Left    SHOULDER ARTHROSCOPY WITH ROTATOR CUFF REPAIR AND OPEN BICEPS TENODESIS Right 08/07/2019   Procedure: SHOULDER ARTHROSCOPY WITH DEBRIDEMENT, DECOMPRESSION, ROTATOR CUFF REPAIR AND BICEPS TENODESIS. - RNFA;  Surgeon: Christena Flake, MD;  Location: [facility];  Service: Orthopedics;  Laterality: Right;   TOE SURGERY Right    has pin and plate in it   TRANSURETHRAL RESECTION OF BLADDER TUMOR N/A 05/02/2022   Procedure: TRANSURETHRAL  RESECTION OF BLADDER TUMOR (TURBT);  Surgeon: Vanna Scotland, MD;  Location: [facility];  Service: Urology;  Laterality: N/A;   VIDEO BRONCHOSCOPY WITH ENDOBRONCHIAL ULTRASOUND N/A 03/02/2022   Procedure: VIDEO BRONCHOSCOPY WITH ENDOBRONCHIAL ULTRASOUND;  Surgeon: Salena Saner, MD;  Location: [facility];  Service: Cardiopulmonary;  Laterality: N/A;    Medical History: Past Medical History:  Diagnosis Date   Adenoma of right adrenal gland 12/07/2021   a.) CT chest 12/07/2021; measured 2.3 cm; not FDG avid on 01/06/2022 PET CT.   Anxiety    a.) uses BZO (alprazolam) PRN   Aortic atherosclerosis (HCC)    Arthritis    Bilateral carotid artery stenosis    Bladder mass 12/07/2021   a.) CT abd 12/07/2021 --> 9 x 8 mm nodular enhancing focus at the base of the bladder; suspicious for urothelial neoplasm; b.) s/p TURBT 05/02/2022 --> pathology (-) for high grade dysplasia/malignancy   Cervical spondylosis without myelopathy    a.) s/p ACDF C5-C7   COPD (chronic obstructive pulmonary disease) (HCC)    Coronary artery disease 10/16/2009   a.) LHC/PCI 10/16/2009: EF 60%; 50% OM1, 99% dRCA (3.0 x 18 mm Xience V DES). b.) LHC 07/31/2014: EF 65%; 40% pLAD, 20% pLCx, 40% OM1, 30% oRCA, 50% pRCA, 20% mRCA, 10% ISR stent to dRCA; further intervention deferred opting for med. mgmt.   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 b.) s/p lumpectomy, adjuvant XRT; currently on extended AI (anastrozole) therapy   Essential hypertension    GERD (gastroesophageal reflux disease)    Hepatic steatosis    History of 2019 novel coronavirus disease (COVID-19) 04/2023   HLD (hyperlipidemia)    Left thyroid nodule 12/07/2021   a.) CT chest 12/07/2021: 2.6 cm; b.) CT chest 06/05/2023: hypoattenuating --> 2.5 cm   Long term current use of  immunosuppressive drug    a.) MTX for RA.   OSA on CPAP    currently not using   Osteopenia    Personal history of radiation therapy    Pneumonia    PRES (posterior reversible encephalopathy syndrome) 07/14/2014   Pulmonary nodules 11/08/2021   a.) CXR 11/08/2021: 18mm nodular density RUL. b.) CT chest 12/07/2021: 1.9 x 1.4 x 2.5 spiculated lesion anterior RUL. c.) PET  CT 01/06/2022: 2.2 x 1.9 spiculated anterior RUL mass (SUV max 16.4); d.) Bx (+) NSCLC, favoring SCC with tumor necrosis; p40/CK7/GATA3 (+), TTF-1 (-)   Rheumatoid arthritis (HCC)    a.) on MTX   Seasonal allergies    Seizures (HCC) 2014   a.) x 1 episode; etiology never determined.   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   Tubular adenoma     Family History: Family History  Problem Relation Age of Onset   Breast cancer Cousin 4       bilateral at 41 and 77; daughter of maternal aunt who was unaffected   Lung cancer Maternal Aunt        dx 30s; deceased 35s; smoker   Heart attack Father        deceased 11   Stomach cancer Maternal Grandmother     Social History   Socioeconomic History   Marital status: Married    Spouse name: Jimmy   Number of children: 1   Years of education: Not on file   Highest education level: Not on file  Occupational History   Not on file  Tobacco Use   Smoking status: Former    Current packs/day: 0.00    Average packs/day: 1 pack/day for 30.0 years (30.0 ttl pk-yrs)    Types: Cigarettes    Start date: 09/12/1985    Quit date: 09/13/2015    Years since quitting: 7.9   Smokeless tobacco: Never  Vaping Use   Vaping status: Never Used   Substance and Sexual Activity   Alcohol use: Yes    Comment: occasional   Drug use: No   Sexual activity: Not on file  Other Topics Concern   Not on file  Social History Narrative   Not on file   Social Drivers of Health   Financial Resource Strain: Low Risk  (03/31/2022)   Overall Financial Resource Strain (CARDIA)    Difficulty of Paying Living Expenses: Not very hard  Food Insecurity: No Food Insecurity (05/23/2022)   Hunger Vital Sign    Worried About Running Out of Food in the Last Year: Never true    Ran Out of Food in the Last Year: Never true  Transportation Needs: No Transportation Needs (06/30/2022)   PRAPARE - Administrator, Civil Service (Medical): No    Lack of Transportation (Non-Medical): No  Physical Activity: Inactive (03/31/2022)   Exercise Vital Sign    Days of Exercise per Week: 0 days    Minutes of Exercise per Session: 0 min  Stress: No Stress Concern Present (03/31/2022)   [employer] of Occupational Health - Occupational Stress Questionnaire    Feeling of Stress : Only a little  Social Connections: Socially Integrated (03/31/2022)   Social Connection and Isolation Panel [NHANES]    Frequency of Communication with Friends and Family: Three times a week    Frequency of Social Gatherings with Friends and Family: Three times a week    Attends Religious Services: More than 4 times per year    Active Member of Clubs or Organizations: Yes    Attends Banker  Meetings: More than 4 times per year    Marital Status: Married  Catering manager Violence: Not At Risk (03/31/2022)   Humiliation, Afraid, Rape, and Kick questionnaire    Fear of Current or Ex-Partner: No    Emotionally Abused: No    Physically Abused: No    Sexually Abused: No      Review of Systems  Constitutional:  Positive for fatigue. Negative for chills and unexpected weight change.  HENT:  Positive for postnasal drip. Negative for congestion, rhinorrhea, sneezing  and sore throat.   Eyes:  Negative for redness.  Respiratory:  Negative for cough and chest tightness.   Cardiovascular:  Negative for chest pain and palpitations.  Gastrointestinal:  Negative for abdominal pain, constipation, diarrhea, nausea and vomiting.  Genitourinary:  Negative for dysuria and frequency.  Musculoskeletal:  Negative for arthralgias, back pain, joint swelling and neck pain.  Skin:  Negative for rash.  Neurological: Negative.  Negative for tremors and numbness.  Hematological:  Negative for adenopathy. Does not bruise/bleed easily.  Psychiatric/Behavioral:  Negative for behavioral problems (Depression), sleep disturbance and suicidal ideas. The patient is not nervous/anxious.     Vital Signs: BP 120/68   Pulse 73   Temp 97.9 F (36.6 C)   Resp 16   Ht 5' (1.524 m)   Wt 135 lb 9.6 oz (61.5 kg)   SpO2 96%   BMI 26.48 kg/m    Physical Exam Vitals and nursing note reviewed.  Constitutional:      Appearance: Normal appearance.  HENT:     Head: Normocephalic and atraumatic.  Eyes:     Pupils: Pupils are equal, round, and reactive to light.  Cardiovascular:     Rate and Rhythm: Normal rate and regular rhythm.  Pulmonary:     Effort: Pulmonary effort is normal.     Breath sounds: Normal breath sounds.  Neurological:     Mental Status: She is alert and oriented to person, place, and time.  Psychiatric:        Mood and Affect: Mood normal.        Assessment/Plan: 1. Encounter for Medicare annual wellness exam (Primary) AWV performed, lab slip given, UTD on PHM other than shingles vaccine  2. Squamous cell carcinoma of right lung (HCC) Followed by oncology and pulmonology, repeat CT scheduled for Feb 2025  3. Thyroid nodule Seen on CT, will be having [place] scheduled soon via oncology office. Will check thyroid labs    General Counseling: dalexa cloos understanding of the findings of todays visit and agrees with plan of treatment. I have discussed  any further diagnostic evaluation that may be needed or ordered today. We also reviewed her medications today. she has been encouraged to call the office with any questions or concerns that should arise related to todays visit.    No orders of the defined types were placed in this encounter.   No orders of the defined types were placed in this encounter.   Return in about 6 months (around 02/23/2024) for as scheduled.   Total time spent:35 Minutes Time spent includes review of chart, medications, test results, and follow up plan with the patient.   [place] Controlled Substance Database was reviewed by me.  This patient was seen by Lynn Ito, PA-C in collaboration with Dr. Beverely Risen as a part of collaborative care agreement.  Lynn Ito, PA-C Internal medicine

## 2023-08-26 ENCOUNTER — Other Ambulatory Visit: Payer: Self-pay

## 2023-08-31 ENCOUNTER — Other Ambulatory Visit: Payer: Self-pay | Admitting: Physician Assistant

## 2023-08-31 DIAGNOSIS — R5383 Other fatigue: Secondary | ICD-10-CM | POA: Diagnosis not present

## 2023-08-31 DIAGNOSIS — E038 Other specified hypothyroidism: Secondary | ICD-10-CM | POA: Diagnosis not present

## 2023-08-31 DIAGNOSIS — E538 Deficiency of other specified B group vitamins: Secondary | ICD-10-CM | POA: Diagnosis not present

## 2023-08-31 DIAGNOSIS — Z0001 Encounter for general adult medical examination with abnormal findings: Secondary | ICD-10-CM | POA: Diagnosis not present

## 2023-08-31 DIAGNOSIS — E559 Vitamin D deficiency, unspecified: Secondary | ICD-10-CM | POA: Diagnosis not present

## 2023-08-31 DIAGNOSIS — D500 Iron deficiency anemia secondary to blood loss (chronic): Secondary | ICD-10-CM | POA: Diagnosis not present

## 2023-08-31 DIAGNOSIS — R6889 Other general symptoms and signs: Secondary | ICD-10-CM | POA: Diagnosis not present

## 2023-09-01 ENCOUNTER — Encounter: Payer: Self-pay | Admitting: Oncology

## 2023-09-01 LAB — SPECIMEN STATUS REPORT

## 2023-09-02 LAB — LIPID PANEL WITH LDL/HDL RATIO
Cholesterol, Total: 163 mg/dL (ref 100–199)
HDL: 72 mg/dL (ref >39)
LDL Chol Calc (NIH): 73 mg/dL (ref 0–99)
LDL/HDL Ratio: 1 {ratio} (ref 0.0–3.2)
Triglycerides: 100 mg/dL (ref 0–149)
VLDL Cholesterol Cal: 18 mg/dL (ref 5–40)

## 2023-09-02 LAB — URINALYSIS, COMPLETE
Bilirubin, UA: NEGATIVE
Glucose, UA: NEGATIVE
Ketones, UA: NEGATIVE
Leukocytes,UA: NEGATIVE
Nitrite, UA: NEGATIVE
RBC, UA: NEGATIVE
Specific Gravity, UA: 1.02 (ref 1.005–1.030)
Urobilinogen, Ur: 0.2 mg/dL (ref 0.2–1.0)
pH, UA: 6.5 (ref 5.0–7.5)

## 2023-09-02 LAB — COMPREHENSIVE METABOLIC PANEL
ALT: 14 [IU]/L (ref 0–32)
AST: 16 [IU]/L (ref 0–40)
Albumin: 4.1 g/dL (ref 3.9–4.9)
Alkaline Phosphatase: 98 [IU]/L (ref 44–121)
BUN/Creatinine Ratio: 25 (ref 12–28)
BUN: 18 mg/dL (ref 8–27)
Bilirubin Total: <0.2 mg/dL (ref 0.0–1.2)
CO2: 24 mmol/L (ref 20–29)
Calcium: 8.5 mg/dL — ABNORMAL LOW (ref 8.7–10.3)
Chloride: 103 mmol/L (ref 96–106)
Creatinine, Ser: 0.72 mg/dL (ref 0.57–1.00)
Globulin, Total: 2.1 g/dL (ref 1.5–4.5)
Glucose: 89 mg/dL (ref 70–99)
Potassium: 4.8 mmol/L (ref 3.5–5.2)
Sodium: 140 mmol/L (ref 134–144)
Total Protein: 6.2 g/dL (ref 6.0–8.5)
eGFR: 92 mL/min/{1.73_m2} (ref >59)

## 2023-09-02 LAB — CBC WITH DIFFERENTIAL/PLATELET
Basophils Absolute: 0 10*3/uL (ref 0.0–0.2)
Basos: 1 %
EOS (ABSOLUTE): 0.1 10*3/uL (ref 0.0–0.4)
Eos: 2 %
Hematocrit: 38.6 % (ref 34.0–46.6)
Hemoglobin: 12 g/dL (ref 11.1–15.9)
Immature Grans (Abs): 0 10*3/uL (ref 0.0–0.1)
Immature Granulocytes: 0 %
Lymphocytes Absolute: 1.4 10*3/uL (ref 0.7–3.1)
Lymphs: 25 %
MCH: 29.2 pg (ref 26.6–33.0)
MCHC: 31.1 g/dL — ABNORMAL LOW (ref 31.5–35.7)
MCV: 94 fL (ref 79–97)
Monocytes Absolute: 0.6 10*3/uL (ref 0.1–0.9)
Monocytes: 11 %
Neutrophils Absolute: 3.4 10*3/uL (ref 1.4–7.0)
Neutrophils: 61 %
Platelets: 203 10*3/uL (ref 150–450)
RBC: 4.11 x10E6/uL (ref 3.77–5.28)
RDW: 13.9 % (ref 11.7–15.4)
WBC: 5.5 10*3/uL (ref 3.4–10.8)

## 2023-09-02 LAB — IRON AND TIBC
Iron Saturation: 32 % (ref 15–55)
Iron: 100 ug/dL (ref 27–139)
Total Iron Binding Capacity: 309 ug/dL (ref 250–450)
UIBC: 209 ug/dL (ref 118–369)

## 2023-09-02 LAB — TSH: TSH: 1.71 u[IU]/mL (ref 0.450–4.500)

## 2023-09-02 LAB — VITAMIN D 25 HYDROXY (VIT D DEFICIENCY, FRACTURES): Vit D, 25-Hydroxy: 24.5 ng/mL — ABNORMAL LOW (ref 30.0–100.0)

## 2023-09-02 LAB — URINE CULTURE

## 2023-09-02 LAB — B12 AND FOLATE PANEL
Folate: 13.6 ng/mL (ref >3.0)
Vitamin B-12: 253 pg/mL (ref 232–1245)

## 2023-09-02 LAB — MICROSCOPIC EXAMINATION
Bacteria, UA: NONE SEEN
Casts: NONE SEEN /LPF
Epithelial Cells (non renal): NONE SEEN /[HPF] (ref 0–10)
WBC, UA: NONE SEEN /HPF (ref 0–5)

## 2023-09-02 LAB — FERRITIN: Ferritin: 74 ng/mL (ref 15–150)

## 2023-09-02 LAB — T4, FREE: Free T4: 0.89 ng/dL (ref 0.82–1.77)

## 2023-09-03 ENCOUNTER — Encounter: Payer: Self-pay | Admitting: Oncology

## 2023-09-14 ENCOUNTER — Telehealth: Payer: Self-pay

## 2023-09-14 NOTE — Telephone Encounter (Signed)
 Pt advised about labs and advised her to take OTC vitamin d with calcium and also make appt for B12 once a week for 3 weeks and then once a month for 3 months

## 2023-09-14 NOTE — Telephone Encounter (Signed)
-----   Message from Carlean Jews sent at 09/12/2023 12:46 PM EST ----- Please let her know her calcium is a little low, Vit D also low--needs supplement, cholesterol is improved, B12 low and may start injections

## 2023-09-21 ENCOUNTER — Ambulatory Visit (INDEPENDENT_AMBULATORY_CARE_PROVIDER_SITE_OTHER): Payer: Medicare HMO

## 2023-09-21 DIAGNOSIS — E538 Deficiency of other specified B group vitamins: Secondary | ICD-10-CM

## 2023-09-21 MED ORDER — CYANOCOBALAMIN 1000 MCG/ML IJ SOLN
1000.0000 ug | Freq: Once | INTRAMUSCULAR | Status: AC
  Administered 2023-09-21: 1000 ug via INTRAMUSCULAR

## 2023-09-27 ENCOUNTER — Ambulatory Visit (INDEPENDENT_AMBULATORY_CARE_PROVIDER_SITE_OTHER): Payer: Medicare HMO

## 2023-09-27 ENCOUNTER — Ambulatory Visit: Payer: Medicare HMO | Admitting: Urology

## 2023-09-27 VITALS — BP 148/79 | HR 78 | Ht 60.0 in | Wt 138.5 lb

## 2023-09-27 DIAGNOSIS — E538 Deficiency of other specified B group vitamins: Secondary | ICD-10-CM

## 2023-09-27 DIAGNOSIS — D494 Neoplasm of unspecified behavior of bladder: Secondary | ICD-10-CM | POA: Diagnosis not present

## 2023-09-27 DIAGNOSIS — Z08 Encounter for follow-up examination after completed treatment for malignant neoplasm: Secondary | ICD-10-CM

## 2023-09-27 DIAGNOSIS — Z87448 Personal history of other diseases of urinary system: Secondary | ICD-10-CM | POA: Diagnosis not present

## 2023-09-27 LAB — URINALYSIS, COMPLETE
Bilirubin, UA: NEGATIVE
Glucose, UA: NEGATIVE
Ketones, UA: NEGATIVE
Leukocytes,UA: NEGATIVE
Nitrite, UA: NEGATIVE
Protein,UA: NEGATIVE
Specific Gravity, UA: 1.015 (ref 1.005–1.030)
Urobilinogen, Ur: 0.2 mg/dL (ref 0.2–1.0)
pH, UA: 5.5 (ref 5.0–7.5)

## 2023-09-27 LAB — MICROSCOPIC EXAMINATION

## 2023-09-27 MED ORDER — CYANOCOBALAMIN 1000 MCG/ML IJ SOLN
1000.0000 ug | Freq: Once | INTRAMUSCULAR | Status: AC
  Administered 2023-09-27: 1000 ug via INTRAMUSCULAR

## 2023-09-27 NOTE — Progress Notes (Signed)
   09/27/23  CC:  Chief Complaint  Patient presents with   Cysto    HPI: 68 year old female with personal history of presumed bladder cancer presents today for surveillance cystoscopy.  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  TURBT was delayed for several issues including for lobectomy and initiation of chemo for new diagnosis of lung cancer.   Ultimately, she underwent TURBT on 05/02/2022.  Intraoperatively, the lesion was much smaller than the patient in the office and had almost an atrophic-like appearance.  She also received postoperative gemcitabine .   Surgical pathology was consistent mildly thickened surface urothelial mucosa with no dysplasia was appreciated.  Presumably involution of the tumor was related to Taxotere  and cisplatin  which she is receiving for lung cancer.  She has no complaints today other than still not feeling 100%.  She recently stopped working.  Blood pressure 101/66, pulse 93, height 5\' 2"  (1.575 m), weight 137 lb (62.1 kg). NED. A&Ox3.   No respiratory distress   Abd soft, NT, ND Normal external genitalia with patent urethral meatus  Cystoscopy Procedure Note  Patient identification was confirmed, informed consent was obtained, and patient was prepped using Betadine solution.  Lidocaine  jelly was administered per urethral meatus.    Procedure: - Flexible cystoscope introduced, without any difficulty.   - Thorough search of the bladder revealed:    normal urethral meatus    normal urothelium with stellate scar near bladder neck    no stones    no ulcers     no tumors    no urethral polyps    no trabeculation  -  Ureteral orifices were normal in position and appearance.  Post-Procedure: - Patient tolerated the procedure well  Assessment/ Plan:  1. History of bladder mass Cystoscopy today is negative  Recommend follow-up cystoscopy in 12 months for surveillance - Urinalysis, Complete   Dustin Gimenez, MD

## 2023-09-28 ENCOUNTER — Other Ambulatory Visit: Payer: Self-pay

## 2023-10-04 ENCOUNTER — Ambulatory Visit: Payer: Medicare HMO

## 2023-10-05 ENCOUNTER — Encounter: Payer: Self-pay | Admitting: Physician Assistant

## 2023-10-05 ENCOUNTER — Ambulatory Visit (INDEPENDENT_AMBULATORY_CARE_PROVIDER_SITE_OTHER): Payer: Medicare HMO | Admitting: Physician Assistant

## 2023-10-05 ENCOUNTER — Ambulatory Visit: Payer: Medicare HMO

## 2023-10-05 VITALS — BP 135/90 | HR 83 | Temp 98.7°F | Resp 16 | Ht 60.0 in | Wt 136.0 lb

## 2023-10-05 DIAGNOSIS — E538 Deficiency of other specified B group vitamins: Secondary | ICD-10-CM | POA: Diagnosis not present

## 2023-10-05 DIAGNOSIS — J0100 Acute maxillary sinusitis, unspecified: Secondary | ICD-10-CM

## 2023-10-05 MED ORDER — AMOXICILLIN-POT CLAVULANATE 875-125 MG PO TABS: 1.0000 | ORAL_TABLET | Freq: Two times a day (BID) | ORAL | 0 refills | Status: DC

## 2023-10-05 MED ORDER — CYANOCOBALAMIN 1000 MCG/ML IJ SOLN
1000.0000 ug | Freq: Once | INTRAMUSCULAR | Status: AC
  Administered 2023-10-05: 1000 ug via INTRAMUSCULAR

## 2023-10-05 NOTE — Progress Notes (Signed)
[facility] [address] [place], [place] [address]  Internal MEDICINE  Office Visit Note  Patient Name: Michelle Moore  [id number]  [id number]  Date of Service: 10/05/2023  Chief Complaint  Patient presents with   Acute Visit   Sinus Problem   Cough    Some wheezing/SOB     HPI Pt is here for a sick visit. -Some sinus congestion for last few days -taking coricidin BP for cold and flu -drainage, a little coughing -Little SOB and occasional wheeze but clears with cough -Husband has a sinus infection currently -dry weather has been bothersome and has been using humidifier  Current Medication:  Outpatient Encounter Medications as of 10/05/2023  Medication Sig   albuterol (VENTOLIN HFA) 108 (90 Base) MCG/ACT inhaler Inhale 1-2 puffs into the lungs every 6 (six) hours as needed for wheezing or shortness of breath.   alendronate (FOSAMAX) 70 MG tablet TAKE 1 TABLET BY MOUTH ONCE A WEEK. TAKE WITH A FULL GLASS OF WATER ON AN EMPTY STOMACH. STAND OR SIT UPRIGHT FOR 1 HOUR AFTER TAKING   ALPRAZolam (XANAX) 0.25 MG tablet TAKE ONE (1) TO TWO (2) TABLET BY MOUTH USE EVERY NIGHT   amoxicillin-clavulanate (AUGMENTIN) 875-125 MG tablet Take 1 tablet by mouth 2 (two) times daily. Take with food.   aspirin-acetaminophen-caffeine (EXCEDRIN MIGRAINE) 250-250-65 MG tablet Take by mouth every 6 (six) hours as needed for headache.   fluticasone (FLONASE) 50 MCG/ACT nasal spray Place 2 sprays into both nostrils daily as needed for allergies.   fluticasone-salmeterol (ADVAIR) 250-50 MCG/ACT AEPB INHALE 1 PUFF TWICE DAILY IN THE MORNING AND AT BEDTIME   folic acid (FOLVITE) 1 MG tablet Take 2 mg by mouth in the morning.   methotrexate (RHEUMATREX) 2.5 MG tablet Take 20 mg by mouth once a week. WEDNESDAY   montelukast (SINGULAIR) 10 MG tablet TAKE (1) TABLET BY MOUTH  AT BEDTIME   omeprazole (PRILOSEC) 40 MG capsule TAKE (1) CAPSULE BY MOUTH EVERY DAY   potassium chloride (KLOR-CON) 10  MEQ tablet TAKE (1) TABLET BY MOUTH EVERY DAY   rosuvastatin (CRESTOR) 40 MG tablet Take 40 mg by mouth every evening.   sertraline (ZOLOFT) 100 MG tablet TAKE (1) TABLET BY MOUTH EVERY DAY   traMADol (ULTRAM) 50 MG tablet Take 1-2 tablets (50-100 mg total) by mouth 3 (three) times daily as needed.   albuterol (PROVENTIL) (2.5 MG/3ML) 0.083% nebulizer solution Take 3 mLs (2.5 mg total) by nebulization every 6 (six) hours as needed for wheezing or shortness of breath.   [EXPIRED] cyanocobalamin (VITAMIN B12) injection 1,000 mcg    No facility-administered encounter medications on file as of 10/05/2023.      Medical History: Past Medical History:  Diagnosis Date   Adenoma of right adrenal gland 12/07/2021   a.) CT chest 12/07/2021; measured 2.3 cm; not FDG avid on 01/06/2022 PET CT.   Anxiety    a.) uses BZO (alprazolam) PRN   Aortic atherosclerosis (HCC)    Arthritis    Bilateral carotid artery stenosis    Bladder mass 12/07/2021   a.) CT abd 12/07/2021 --> 9 x 8 mm nodular enhancing focus at the base of the bladder; suspicious for urothelial neoplasm; b.) s/p TURBT 05/02/2022 --> pathology (-) for high grade dysplasia/malignancy   Cervical spondylosis without myelopathy    a.) s/p ACDF C5-C7   COPD (chronic obstructive pulmonary disease) (HCC)    Coronary artery disease 10/16/2009   a.) LHC/PCI 10/16/2009: EF 60%; 50% OM1, 99% dRCA (  3.0 x 18 mm Xience V DES). b.) LHC 07/31/2014: EF 65%; 40% pLAD, 20% pLCx, 40% OM1, 30% oRCA, 50% pRCA, 20% mRCA, 10% ISR stent to dRCA; further intervention deferred opting for med. mgmt.   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 b.) s/p lumpectomy, adjuvant XRT; currently on extended AI (anastrozole) therapy   Essential hypertension    GERD (gastroesophageal reflux disease)     Hepatic steatosis    History of 2019 novel coronavirus disease (COVID-19) 04/2023   HLD (hyperlipidemia)    Left thyroid nodule 12/07/2021   a.) CT chest 12/07/2021: 2.6 cm; b.) CT chest 06/05/2023: hypoattenuating --> 2.5 cm   Long term current use of immunosuppressive drug    a.) MTX for RA.   OSA on CPAP    currently not using   Osteopenia    Personal history of radiation therapy    Pneumonia    PRES (posterior reversible encephalopathy syndrome) 07/14/2014   Pulmonary nodules 11/08/2021   a.) CXR 11/08/2021: 18mm nodular density RUL. b.) CT chest 12/07/2021: 1.9 x 1.4 x 2.5 spiculated lesion anterior RUL. c.) PET CT 01/06/2022: 2.2 x 1.9 spiculated anterior RUL mass (SUV max 16.4); d.) Bx (+) NSCLC, favoring SCC with tumor necrosis; p40/CK7/GATA3 (+), TTF-1 (-)   Rheumatoid arthritis (HCC)    a.) on MTX   Seasonal allergies    Seizures (HCC) 2014   a.) x 1 episode; etiology never determined.   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   Tubular adenoma      Vital Signs: BP (!) 135/90   Pulse 83   Temp 98.7 F (37.1 C)   Resp 16   Ht 5' (1.524 m)   Wt 136 lb (61.7 kg)   SpO2 97%   BMI 26.56 kg/m    Review of Systems  Constitutional:  Positive for fatigue. Negative for fever.  HENT:  Positive for congestion and postnasal drip. Negative for mouth sores.   Respiratory:  Positive for cough, shortness of breath and wheezing.   Cardiovascular:  Negative for chest pain.  Genitourinary:  Negative for flank pain.  Psychiatric/Behavioral: Negative.      Physical Exam Vitals and nursing note reviewed.  Constitutional:       Appearance: Normal appearance.  HENT:     Head: Normocephalic and atraumatic.     Right Ear: Tympanic membrane normal.     Left Ear: Tympanic membrane normal.     Nose: Congestion present.  Eyes:     Pupils: Pupils are equal, round, and reactive to light.  Cardiovascular:     Rate and Rhythm: Normal rate and regular rhythm.  Pulmonary:     Effort: Pulmonary effort is normal.     Breath sounds: Normal breath sounds.  Neurological:     Mental Status: She is alert and oriented to person, place, and time.  Psychiatric:        Mood and Affect: Mood  normal.       Assessment/Plan: 1. Acute non-recurrent maxillary sinusitis (Primary) Likely viral. Discussed continuing OTC medications for symptomatic relief. Will send augmentin to have in case not improving or is worsening over the weekend. - amoxicillin-clavulanate (AUGMENTIN) 875-125 MG tablet; Take 1 tablet by mouth 2 (two) times daily. Take with food.  Dispense: 14 tablet; Refill: 0  2. B12 deficiency - cyanocobalamin (VITAMIN B12) injection 1,000 mcg   General Counseling: Rychelle verbalizes understanding of the findings of todays visit and agrees with plan of treatment. I have discussed any further diagnostic evaluation that may be needed or ordered today. We also reviewed her medications today. she has been encouraged to call the office with any questions or concerns that should arise related to todays visit.    Counseling:    No orders of the defined types were placed in this encounter.   Meds ordered this encounter  Medications   cyanocobalamin (VITAMIN B12) injection 1,000 mcg   amoxicillin-clavulanate (AUGMENTIN) 875-125 MG tablet    Sig: Take 1 tablet by mouth 2 (two) times daily. Take with food.    Dispense:  14 tablet    Refill:  0    Time spent:25 Minutes

## 2023-10-09 ENCOUNTER — Other Ambulatory Visit: Payer: Self-pay | Admitting: Internal Medicine

## 2023-10-09 DIAGNOSIS — F411 Generalized anxiety disorder: Secondary | ICD-10-CM

## 2023-10-09 NOTE — Telephone Encounter (Signed)
Please send med.Marland Kitchen

## 2023-10-10 DIAGNOSIS — M5412 Radiculopathy, cervical region: Secondary | ICD-10-CM | POA: Diagnosis not present

## 2023-10-10 DIAGNOSIS — M47812 Spondylosis without myelopathy or radiculopathy, cervical region: Secondary | ICD-10-CM | POA: Diagnosis not present

## 2023-10-17 DIAGNOSIS — M4802 Spinal stenosis, cervical region: Secondary | ICD-10-CM | POA: Diagnosis not present

## 2023-10-17 DIAGNOSIS — M5412 Radiculopathy, cervical region: Secondary | ICD-10-CM | POA: Diagnosis not present

## 2023-10-24 ENCOUNTER — Ambulatory Visit
Admission: RE | Admit: 2023-10-24 | Discharge: 2023-10-24 | Disposition: A | Discharge status: Home / Self Care | Payer: Medicare HMO | Source: Ambulatory Visit | Attending: Oncology | Admitting: Oncology

## 2023-10-24 DIAGNOSIS — I700 Atherosclerosis of aorta: Secondary | ICD-10-CM | POA: Diagnosis not present

## 2023-10-24 DIAGNOSIS — C3491 Malignant neoplasm of unspecified part of right bronchus or lung: Secondary | ICD-10-CM | POA: Diagnosis not present

## 2023-10-24 DIAGNOSIS — C342 Malignant neoplasm of middle lobe, bronchus or lung: Secondary | ICD-10-CM | POA: Diagnosis not present

## 2023-10-24 DIAGNOSIS — E041 Nontoxic single thyroid nodule: Secondary | ICD-10-CM | POA: Diagnosis not present

## 2023-10-24 MED ORDER — IOHEXOL 300 MG/ML  SOLN
75.0000 mL | Freq: Once | INTRAMUSCULAR | Status: AC | PRN
  Administered 2023-10-24: 75 mL via INTRAVENOUS

## 2023-10-25 DIAGNOSIS — Z79899 Other long term (current) drug therapy: Secondary | ICD-10-CM | POA: Diagnosis not present

## 2023-10-25 DIAGNOSIS — M0579 Rheumatoid arthritis with rheumatoid factor of multiple sites without organ or systems involvement: Secondary | ICD-10-CM | POA: Diagnosis not present

## 2023-10-30 ENCOUNTER — Encounter: Payer: Self-pay | Admitting: Oncology

## 2023-10-31 ENCOUNTER — Inpatient Hospital Stay: Payer: Medicare HMO | Admitting: Oncology

## 2023-11-06 ENCOUNTER — Encounter: Payer: Self-pay | Admitting: Oncology

## 2023-11-08 ENCOUNTER — Inpatient Hospital Stay: Payer: Medicare HMO | Attending: Oncology | Admitting: Oncology

## 2023-11-08 ENCOUNTER — Inpatient Hospital Stay: Payer: Medicare HMO | Admitting: Oncology

## 2023-11-08 ENCOUNTER — Encounter: Payer: Self-pay | Admitting: Oncology

## 2023-11-08 VITALS — BP 141/76 | HR 70 | Temp 99.3°F | Resp 16 | Ht 60.0 in | Wt 143.3 lb

## 2023-11-08 DIAGNOSIS — C3491 Malignant neoplasm of unspecified part of right bronchus or lung: Secondary | ICD-10-CM | POA: Diagnosis not present

## 2023-11-08 DIAGNOSIS — Z902 Acquired absence of lung [part of]: Secondary | ICD-10-CM | POA: Diagnosis not present

## 2023-11-08 DIAGNOSIS — Z1231 Encounter for screening mammogram for malignant neoplasm of breast: Secondary | ICD-10-CM | POA: Diagnosis not present

## 2023-11-08 DIAGNOSIS — M4802 Spinal stenosis, cervical region: Secondary | ICD-10-CM | POA: Diagnosis not present

## 2023-11-08 DIAGNOSIS — E041 Nontoxic single thyroid nodule: Secondary | ICD-10-CM | POA: Diagnosis not present

## 2023-11-08 DIAGNOSIS — C3411 Malignant neoplasm of upper lobe, right bronchus or lung: Secondary | ICD-10-CM | POA: Insufficient documentation

## 2023-11-08 DIAGNOSIS — Z853 Personal history of malignant neoplasm of breast: Secondary | ICD-10-CM | POA: Insufficient documentation

## 2023-11-08 DIAGNOSIS — M5412 Radiculopathy, cervical region: Secondary | ICD-10-CM | POA: Diagnosis not present

## 2023-11-08 DIAGNOSIS — M8580 Other specified disorders of bone density and structure, unspecified site: Secondary | ICD-10-CM | POA: Diagnosis not present

## 2023-11-08 DIAGNOSIS — Z923 Personal history of irradiation: Secondary | ICD-10-CM | POA: Diagnosis not present

## 2023-11-08 DIAGNOSIS — Z9221 Personal history of antineoplastic chemotherapy: Secondary | ICD-10-CM | POA: Diagnosis not present

## 2023-11-08 NOTE — Progress Notes (Signed)
 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  INTERVAL HISTORY: Patient returns to clinic today for routine evaluation and discussion of her imaging results.  She currently feels well and is asymptomatic.  She has no neurologic complaints.  She denies any recent fevers or illnesses. She has a good appetite and denies weight loss.  She denies any chest pain, shortness of breath, cough, or hemoptysis.  She denies any nausea, vomiting, constipation, or diarrhea.  She has no urinary complaints.  Patient offers no specific complaints today.    REVIEW OF SYSTEMS:   Review of Systems  Constitutional: Negative.  Negative for fever, malaise/fatigue and weight loss.  Respiratory: Negative.  Negative for cough, hemoptysis and shortness of breath.   Cardiovascular: Negative.  Negative for chest pain and leg swelling.  Gastrointestinal: Negative.  Negative for abdominal pain and heartburn.  Genitourinary:  Negative for dysuria and frequency.  Musculoskeletal: Negative.  Negative for back pain and myalgias.  Skin: Negative.  Negative for rash.  Neurological: Negative.  Negative for dizziness, sensory change, focal weakness and weakness.  Psychiatric/Behavioral: Negative.  The patient is not nervous/anxious.     As per HPI. Otherwise, a complete review of systems is negative.  PAST MEDICAL HISTORY: Past Medical History:  Diagnosis Date   Adenoma of right adrenal gland 12/07/2021   a.) CT chest 12/07/2021; measured 2.3 cm;  not FDG avid on 01/06/2022 PET CT.   Anxiety    a.) uses BZO (alprazolam) PRN   Aortic atherosclerosis (HCC)    Arthritis    Bilateral carotid artery stenosis    Bladder mass 12/07/2021   a.) CT abd 12/07/2021 --> 9 x 8 mm nodular enhancing focus at the base of the bladder; suspicious for urothelial neoplasm; b.) s/p TURBT 05/02/2022 --> pathology (-) for high grade dysplasia/malignancy   Cervical spondylosis without myelopathy    a.) s/p ACDF C5-C7   COPD (chronic obstructive pulmonary disease) (HCC)    Coronary artery disease 10/16/2009   a.) LHC/PCI 10/16/2009: EF 60%; 50% OM1, 99% dRCA (3.0 x 18 mm Xience V DES). b.) LHC 07/31/2014: EF 65%; 40% pLAD, 20% pLCx, 40% OM1, 30% oRCA, 50% pRCA, 20% mRCA, 10% ISR stent to dRCA; further intervention deferred opting for med. mgmt.   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 b.) s/p lumpectomy, adjuvant XRT; currently on extended AI (anastrozole) therapy   Essential hypertension    GERD (gastroesophageal reflux disease)    Hepatic steatosis    History of 2019 novel coronavirus disease (COVID-19) 04/2023   HLD (hyperlipidemia)    Left thyroid nodule 12/07/2021   a.) CT chest 12/07/2021: 2.6 cm; b.) CT chest 06/05/2023: hypoattenuating --> 2.5 cm   Long term current use of immunosuppressive drug    a.) MTX for RA.   OSA on CPAP    currently not using   Osteopenia  Personal history of radiation therapy    Pneumonia    PRES (posterior reversible encephalopathy syndrome) 07/14/2014   Pulmonary nodules 11/08/2021   a.) CXR 11/08/2021: 18mm nodular density RUL. b.) CT chest 12/07/2021: 1.9 x 1.4 x 2.5 spiculated lesion anterior RUL. c.) PET CT 01/06/2022: 2.2 x 1.9 spiculated anterior RUL mass (SUV max 16.4); d.) Bx (+) NSCLC, favoring SCC with tumor necrosis;  p40/CK7/GATA3 (+), TTF-1 (-)   Rheumatoid arthritis (HCC)    a.) on MTX   Seasonal allergies    Seizures (HCC) 2014   a.) x 1 episode; etiology never determined.   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   Tubular adenoma     PAST SURGICAL HISTORY: Past Surgical History:  Procedure Laterality Date   ABDOMINAL HYSTERECTOMY     ANTERIOR CERVICAL DECOMP/DISCECTOMY FUSION N/A 01/20/2014   Procedure: ANTERIOR CERVICAL DECOMPRESSION/DISCECTOMY FUSION 2 LEVELS cervical five/six six Arline Asp;  Surgeon: Cristi Loron, MD;  Location: [facility];  Service: Neurosurgery;  Laterality: N/A;   APPENDECTOMY     BACK SURGERY     lumbar   BLADDER INSTILLATION N/A 05/02/2022   Procedure: BLADDER INSTILLATION OF GEMCITABINE;  Surgeon: Vanna Scotland, MD;  Location: [facility];  Service: Urology;  Laterality: N/A;   BREAST BIOPSY Right 02/01/2018   x shape, DCIS    BREAST BIOPSY  01/16/2019   coil, BENIGN MAMMARY TISSUE WITH MODERATE STROMAL FIBROSIS AND COARSE DYSTROPHIC CALCIFICATIONS of the RIGHT breast   BREAST LUMPECTOMY Right 03/07/2018   Procedure: BREAST LUMPECTOMY/ RE EXCISION;  Surgeon: Carolan Shiver, MD;  Location: [facility];  Service: General;  Laterality: Right;   CARDIAC CATHETERIZATION Left 07/31/2014   Procedure: CARDIAC CATHETERIZATION; Location: [facility]; Surgeon: Arnoldo Hooker, MD   COLONOSCOPY WITH PROPOFOL N/A 02/11/2021   Procedure: COLONOSCOPY WITH PROPOFOL;  Surgeon: Midge Minium, MD;  Location: [facility];  Service: Endoscopy;  Laterality: N/A;  Requests Early   CORONARY ANGIOPLASTY WITH STENT PLACEMENT Left 10/16/2009    Procedure: CORONARY ANGIOPLASTY WITH STENT PLACEMENT; Location: [facility]; Surgeon: Rudean Hitt, MD   CYSTOSCOPY W/ RETROGRADES Bilateral 05/02/2022   Procedure: CYSTOSCOPY WITH RETROGRADE PYELOGRAM;  Surgeon: Vanna Scotland, MD;  Location: [facility];  Service: Urology;  Laterality: Bilateral;   ENDOBRONCHIAL ULTRASOUND Right 07/07/2023   Procedure: ENDOBRONCHIAL ULTRASOUND;  Surgeon: Salena Saner, MD;  Location: [facility];  Service: Pulmonary;  Laterality: Right;   ESOPHAGOGASTRODUODENOSCOPY (EGD) WITH PROPOFOL N/A 10/07/2022   Procedure: ESOPHAGOGASTRODUODENOSCOPY (EGD) WITH PROPOFOL;  Surgeon: Midge Minium, MD;  Location: [facility];  Service: Endoscopy;  Laterality: N/A;  sleep apnea   FLEXIBLE BRONCHOSCOPY Right 07/07/2023   Procedure: FLEXIBLE BRONCHOSCOPY;  Surgeon: Salena Saner, MD;  Location: [facility];  Service: Pulmonary;  Laterality: Right;   INTERCOSTAL NERVE BLOCK Right 03/17/2022   Procedure: INTERCOSTAL NERVE BLOCK;  Surgeon: Loreli Slot, MD;  Location: [facility];  Service: Thoracic;  Laterality: Right;   LASIK Bilateral    LUNG LOBECTOMY (RIGHT UPPER LOBE) Right 03/17/2022   LYMPH NODE DISSECTION Right 03/17/2022   Procedure: LYMPH NODE DISSECTION;  Surgeon: Loreli Slot, MD;  Location: [facility];  Service: Thoracic;  Laterality: Right;   PARTIAL MASTECTOMY WITH NEEDLE LOCALIZATION Right 02/21/2018   Procedure: PARTIAL MASTECTOMY WITH NEEDLE LOCALIZATION;  Surgeon: Carolan Shiver, MD;  Location: [facility];  Service: General;  Laterality: Right;   POLYPECTOMY N/A 02/11/2021   Procedure: POLYPECTOMY;  Surgeon: Midge Minium, MD;  Location: [facility];  Service: Endoscopy;  Laterality: N/A;   ROTATOR CUFF REPAIR Left    SHOULDER ARTHROSCOPY WITH ROTATOR CUFF REPAIR AND OPEN BICEPS TENODESIS Right 08/07/2019   Procedure: SHOULDER ARTHROSCOPY WITH DEBRIDEMENT, DECOMPRESSION, ROTATOR CUFF REPAIR AND BICEPS TENODESIS. - RNFA;  Surgeon: Christena Flake, MD;  Location: [facility];  Service: Orthopedics;  Laterality: Right;   TOE SURGERY Right    has pin and plate in it   TRANSURETHRAL RESECTION OF BLADDER TUMOR N/A 05/02/2022   Procedure: TRANSURETHRAL RESECTION OF BLADDER TUMOR (TURBT);  Surgeon: Vanna Scotland, MD;  Location: [facility];  Service: Urology;  Laterality: N/A;   VIDEO BRONCHOSCOPY WITH ENDOBRONCHIAL ULTRASOUND N/A 03/02/2022   Procedure: VIDEO BRONCHOSCOPY WITH ENDOBRONCHIAL ULTRASOUND;  Surgeon: Salena Saner, MD;  Location: [facility];  Service: Cardiopulmonary;  Laterality: N/A;    FAMILY HISTORY: Family History  Problem Relation Age of Onset   Breast cancer Cousin 68       bilateral at 61 and 79; daughter of maternal aunt who was unaffected   Lung cancer Maternal Aunt        dx 28s; deceased 61s; smoker   Heart attack Father        deceased 63   Stomach cancer Maternal Grandmother     ADVANCED DIRECTIVES (Y/N):  N  HEALTH MAINTENANCE: Social History   Tobacco Use   Smoking status: Former    Current packs/day: 0.00    Average packs/day: 1 pack/day for 30.0 years (30.0 ttl pk-yrs)    Types: Cigarettes    Start date: 09/12/1985    Quit date: 09/13/2015    Years since quitting: 8.1   Smokeless tobacco: Never  Vaping Use   Vaping status: Never Used  Substance Use Topics   Alcohol use: Yes    Comment: occasional   Drug use: No     Colonoscopy:  PAP:  Bone density:  Lipid panel:  Allergies  Allergen Reactions   Other     BANDAIDS-OF LEFT ON FOR AN EXTENDED PERIOD OF TIME  Patient states latex rubber gloves are ok    Current Outpatient Medications  Medication Sig Dispense Refill   albuterol (PROVENTIL) (2.5 MG/3ML) 0.083% nebulizer solution Take 3 mLs (2.5 mg total) by nebulization every 6 (six) hours as needed for wheezing or shortness of breath. 125 mL 2   albuterol (VENTOLIN HFA) 108 (90 Base) MCG/ACT inhaler Inhale 1-2 puffs into the lungs every 6 (six) hours as needed for wheezing or  shortness of breath. 1 g 3   alendronate (FOSAMAX) 70 MG tablet TAKE 1 TABLET BY MOUTH ONCE A WEEK. TAKE WITH A FULL GLASS OF WATER ON AN EMPTY STOMACH. STAND OR SIT UPRIGHT FOR 1 HOUR AFTER TAKING 12 tablet 1   ALPRAZolam (XANAX) 0.25 MG tablet TAKE ONE (1) TO TWO (2) TABLETS BY MOUTH EVERY NIGHT 60 tablet 0   aspirin-acetaminophen-caffeine (EXCEDRIN MIGRAINE) 250-250-65 MG tablet Take by mouth every 6 (six) hours as needed for headache.     fluticasone (FLONASE) 50 MCG/ACT nasal spray Place 2 sprays into both nostrils daily as needed for allergies. 60 mL 1   fluticasone-salmeterol (ADVAIR) 250-50 MCG/ACT AEPB INHALE 1 PUFF TWICE  DAILY IN THE MORNING AND AT BEDTIME 180 each 3   folic acid (FOLVITE) 1 MG tablet Take 2 mg by mouth in the morning.     methotrexate (RHEUMATREX) 2.5 MG tablet Take 20 mg by mouth once a week. WEDNESDAY     montelukast (SINGULAIR) 10 MG tablet TAKE (1) TABLET BY MOUTH  AT BEDTIME 90 tablet 1   omeprazole (PRILOSEC) 40 MG capsule TAKE (1) CAPSULE BY MOUTH EVERY DAY 90 capsule 3   potassium chloride (KLOR-CON) 10 MEQ tablet TAKE (1) TABLET BY MOUTH EVERY DAY 90 tablet 1   rosuvastatin (CRESTOR) 40 MG tablet Take 40 mg by mouth every evening.     sertraline (ZOLOFT) 100 MG tablet TAKE (1) TABLET BY MOUTH EVERY DAY 90 tablet 1   traMADol (ULTRAM) 50 MG tablet Take 1-2 tablets (50-100 mg total) by mouth 3 (three) times daily as needed. 60 tablet 0   No current facility-administered medications for this visit.    OBJECTIVE: Vitals:   11/08/23 1123  BP: (!) 141/76  Pulse: 70  Resp: 16  Temp: 99.3 F (37.4 C)  SpO2: 99%      Body mass index is 27.99 kg/m.    ECOG FS:0 - Asymptomatic  General: Well-developed, well-nourished, no acute distress. Eyes: Pink conjunctiva, anicteric sclera. HEENT: Normocephalic, moist mucous membranes. Lungs: No audible wheezing or coughing. Heart: Regular rate and rhythm. Abdomen: Soft, nontender, no obvious  distention. Musculoskeletal: No edema, cyanosis, or clubbing. Neuro: Alert, answering all questions appropriately. Cranial nerves grossly intact. Skin: No rashes or petechiae noted. Psych: Normal affect.  LAB RESULTS:  Lab Results  Component Value Date   NA 140 08/31/2023   K 4.8 08/31/2023   CL 103 08/31/2023   CO2 24 08/31/2023   GLUCOSE 89 08/31/2023   BUN 18 08/31/2023   CREATININE 0.72 08/31/2023   CALCIUM 8.5 (L) 08/31/2023   PROT 6.2 08/31/2023   ALBUMIN 4.1 08/31/2023   AST 16 08/31/2023   ALT 14 08/31/2023   ALKPHOS 98 08/31/2023   BILITOT <0.2 08/31/2023   GFRNONAA >60 06/22/2023   GFRAA >60 04/09/2020    Lab Results  Component Value Date   WBC 5.5 08/31/2023   NEUTROABS 3.4 08/31/2023   HGB 12.0 08/31/2023   HCT 38.6 08/31/2023   MCV 94 08/31/2023   PLT 203 08/31/2023     STUDIES: CT CHEST W CONTRAST Result Date: 11/07/2023 CLINICAL DATA:  Non-small cell lung cancer.  * Tracking Code: BO * EXAM: CT CHEST WITH CONTRAST TECHNIQUE: Multidetector CT imaging of the chest was performed during intravenous contrast administration. RADIATION DOSE REDUCTION: This exam was performed according to the departmental dose-optimization program which includes automated exposure control, adjustment of the mA and/or kV according to patient size and/or use of iterative reconstruction technique. CONTRAST:  75mL OMNIPAQUE IOHEXOL 300 MG/ML  SOLN COMPARISON:  06/05/2023. FINDINGS: Cardiovascular: Atherosclerotic calcification of the aorta and coronary arteries. Enlarged pulmonic trunk. Heart is at the upper limits of normal in size to mildly enlarged. Left ventricle is dilated. No pericardial effusion. Mediastinum/Nodes: Mildly heterogeneous low-attenuation mass in the left thyroid measures 2.8 cm. No pathologically enlarged mediastinal, hilar or axillary lymph nodes. A large seroma is seen in the lateral right breast, measuring 3.4 x 6.4 cm, similar. Esophagus is grossly unremarkable.  Lungs/Pleura: Right upper lobectomy. Volume loss in the right middle lobe, as before. Centrilobular emphysema. 4 mm subpleural posterior left upper lobe nodule (4/37), unchanged. No pleural fluid. Airway is otherwise unremarkable. Upper Abdomen: Liver  margin is slightly irregular. Right adrenal mass measures 2.2 cm, stable and consistent with an adenoma on PET 01/06/2022. Thickening of the medial limb left adrenal gland, unchanged. No specific follow-up necessary. Visualized portions of the liver, adrenal glands, left kidney, spleen, pancreas, stomach and bowel are otherwise grossly unremarkable. No upper abdominal adenopathy. Musculoskeletal: Degenerative changes in the spine. Old right rib fractures. IMPRESSION: 1. No evidence of recurrent or metastatic disease. 2. Interval clearing of previously seen right middle lobe mucous plug. 3. 2.8 cm left thyroid nodule. A comment thyroid ultrasound. (Ref: J Am Coll Radiol. 2015 Feb;12(2): 143-50). 4. Right adrenal adenoma. 5. Marginal irregularity the liver raises suspicion for cirrhosis. 6. Aortic atherosclerosis (ICD10-I70.0). Coronary artery calcification. 7. Enlarged pulmonic trunk, indicative of pulmonary arterial hypertension. Electronically Signed   By: Leanna Battles M.D.   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  PLAN:    Stage IIIa squamous cell carcinoma of the right upper lung: Final pathology results from her right upper lobectomy on March 17, 2022 revealed a 4.1 cm lesion that was adherent to the chest wall, but not invading.  She was also noted to have 1 of 33 lymph nodes positive for disease.  This increased her to a stage IIIa. MRI of the brain on April 21, 2022 reviewed independently with no obvious evidence of metastatic disease. Patient completed 4 cycles of adjuvant chemotherapy using cisplatin and Taxotere on June 22, 2022.  Her most recent  imaging with CT scan on 01/21/2024 reviewed independently and reported as above with no obvious evidence of recurrent or progressive disease.  Mucous plug seen previously has resolved.  No further intervention is needed.  Return to clinic in 6 months with repeat CT scan and further evaluation.       DCIS, right breast: Patient underwent lumpectomy followed by adjuvant XRT completing in September 2019.  Because there was no invasive component on her pathology, she did not require adjuvant chemotherapy. Patient could not tolerate tamoxifen or letrozole secondary to worsening joint pain, therefore was switched to anastrozole which she completed in September 2024. Her most recent mammogram on March 21, 2023 was reported as BI-RADS 1.  Repeat mammogram in July 2025.   Osteopenia: Patient's most recent bone mineral density on March 21, 2023 reported T-score of -2.5 which continues to trend down over the past several years.  Continue alendronate, but consider switching to Prolia in the near future.  Patient has also been instructed to continue calcium and vitamin D supplementation.  Repeat bone mineral density in July 2025 along with mammogram as above.   Thyroid nodule: Will get ultrasound in the next 1 to 2 weeks.   Reflux/abdominal pain: Patient does not complain of this today.  Continue omeprazole.    Jeralyn Ruths, MD 11/08/2023 11:53 AM   Cancer Staging  Ductal carcinoma in situ (DCIS) of right breast Staging form: Breast, AJCC 8th Edition - Clinical: Stage 0 (cTis (DCIS), cN0, cM0, ER+, PR-, HER2-) - Signed by Jeralyn Ruths, MD on 03/18/2018 Nuclear grade: G3 Laterality: Right  Squamous cell carcinoma of right lung (HCC) Staging form: Lung, AJCC 8th Edition - Pathologic stage from 04/07/2022: Stage IIIA (pT3, pN1, cM0) - Signed by Jeralyn Ruths, MD on 04/07/2022

## 2023-11-14 ENCOUNTER — Ambulatory Visit: Payer: Medicare HMO | Admitting: Oncology

## 2023-11-15 ENCOUNTER — Ambulatory Visit: Payer: Medicare HMO | Admitting: Pulmonary Disease

## 2023-11-15 ENCOUNTER — Encounter: Payer: Self-pay | Admitting: Pulmonary Disease

## 2023-11-17 ENCOUNTER — Ambulatory Visit
Admission: RE | Admit: 2023-11-17 | Discharge: 2023-11-17 | Disposition: A | Discharge status: Home / Self Care | Payer: Medicare HMO | Source: Ambulatory Visit | Attending: Oncology | Admitting: Oncology

## 2023-11-17 DIAGNOSIS — C3491 Malignant neoplasm of unspecified part of right bronchus or lung: Secondary | ICD-10-CM | POA: Insufficient documentation

## 2023-11-17 DIAGNOSIS — E041 Nontoxic single thyroid nodule: Secondary | ICD-10-CM | POA: Diagnosis not present

## 2023-11-20 ENCOUNTER — Other Ambulatory Visit: Payer: Self-pay | Admitting: Oncology

## 2023-11-27 ENCOUNTER — Telehealth: Payer: Self-pay | Admitting: *Deleted

## 2023-11-27 ENCOUNTER — Other Ambulatory Visit: Payer: Self-pay | Admitting: *Deleted

## 2023-11-27 DIAGNOSIS — E041 Nontoxic single thyroid nodule: Secondary | ICD-10-CM

## 2023-11-27 NOTE — Telephone Encounter (Signed)
 Call placed to patient to review results of thyroid US. Radiologist is recommending thyroid biopsy based on imaging. Called to discuss with patient and she is agreeable. Order entered and message sent to [facility] for IR scheduling. Patient aware that we will call back to let her know once appointment is scheduled.

## 2023-11-28 ENCOUNTER — Encounter: Payer: Self-pay | Admitting: *Deleted

## 2023-11-28 NOTE — Progress Notes (Signed)
 Patient for Michelle Moore guided LT Mid Thyroid Nodule Biopsy on Wed 11/29/23, I called and spoke with the patient on the phone and gave pre-procedure instructions. Pt was made aware to be here at 12:30p and check in at the [facility] registration desk. Pt stated understanding.  Called 11/28/23

## 2023-11-29 ENCOUNTER — Ambulatory Visit
Admission: RE | Admit: 2023-11-29 | Discharge: 2023-11-29 | Disposition: A | Discharge status: Home / Self Care | Source: Ambulatory Visit | Attending: Oncology | Admitting: Oncology

## 2023-11-29 DIAGNOSIS — E041 Nontoxic single thyroid nodule: Secondary | ICD-10-CM | POA: Diagnosis not present

## 2023-11-29 MED ORDER — LIDOCAINE HCL (PF) 1 % IJ SOLN
5.0000 mL | Freq: Once | INTRAMUSCULAR | Status: AC
  Administered 2023-11-29: 5 mL via INTRADERMAL
  Filled 2023-11-29: qty 5

## 2023-12-01 LAB — CYTOLOGY - NON PAP

## 2023-12-05 ENCOUNTER — Telehealth: Payer: Self-pay | Admitting: *Deleted

## 2023-12-05 NOTE — Telephone Encounter (Signed)
 Call placed to patient to update on pathology results. Pathology reviewed by Dr. Orlie Dakin, thyroid nodule is benign. Patient does not require further work up and can keep previously scheduled follow up. Patient verbalized understanding and is in agreement. Encouraged to call with any questions or concerns.

## 2023-12-06 ENCOUNTER — Encounter: Payer: Self-pay | Admitting: Pulmonary Disease

## 2023-12-06 ENCOUNTER — Ambulatory Visit: Admitting: Pulmonary Disease

## 2023-12-06 VITALS — BP 112/70 | HR 92 | Temp 97.7°F | Ht 60.0 in | Wt 141.8 lb

## 2023-12-06 DIAGNOSIS — Z87891 Personal history of nicotine dependence: Secondary | ICD-10-CM | POA: Diagnosis not present

## 2023-12-06 DIAGNOSIS — E041 Nontoxic single thyroid nodule: Secondary | ICD-10-CM | POA: Diagnosis not present

## 2023-12-06 DIAGNOSIS — J9819 Other pulmonary collapse: Secondary | ICD-10-CM

## 2023-12-06 DIAGNOSIS — J449 Chronic obstructive pulmonary disease, unspecified: Secondary | ICD-10-CM | POA: Diagnosis not present

## 2023-12-06 DIAGNOSIS — C3491 Malignant neoplasm of unspecified part of right bronchus or lung: Secondary | ICD-10-CM

## 2023-12-06 DIAGNOSIS — C3411 Malignant neoplasm of upper lobe, right bronchus or lung: Secondary | ICD-10-CM

## 2023-12-06 NOTE — Progress Notes (Signed)
 Subjective:    Patient ID: Michelle Moore, female    DOB: Dec 12, 1955, 68 y.o.   MRN: [id number]  Patient Care Team: Alan Ripper as PCP - General (Physician Assistant) Glory Buff, RN as Oncology Nurse Navigator Orlie Dakin, Tollie Pizza, MD as Consulting Physician (Oncology) Salena Saner, MD as Consulting Physician (Pulmonary Disease)  Chief Complaint  Patient presents with   Follow-up    Cough, shortness of breath and wheezing due to allergies. Not wearing CPAP machine.     BACKGROUND/INTERVAL: Michelle Moore is a 68 year old former smoker with a 30-pack-year history of smoking who presents for follow-up on the issue of COPD and shortness of breath.  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 Dorris Fetch on 17 March 2022.  She had 1 out of 18 nodes positive for carcinoma.  Staging was modified to 3A.  Surveillance CT of 05 June 2023 showed bronchus with increasing volume loss in the right middle lobe she underwent bronchoscopy 07 July 2023 that showed distortion of the right mainstem bronchus postsurgical resection and mucus impaction/plugging of the right middle lobe.  She was last seen on 17 August 2023.  At that time reminded to use her flutter valve consistently due to tendency towards mucous plugging.  CT chest of 24 October 2023 showed no evidence of recurrent or metastatic disease and interval clearing of the previously seen right middle lobe mucous plug.   HPI Discussed the use of AI scribe software for clinical note transcription with the patient, who gave verbal consent to proceed.  History of Present Illness   Michelle Moore is a 68 year old female with squamous cell carcinoma who presents for follow-up of prior mucus impaction on the right middle lobe..  She experiences mild difficulty breathing, which she attributes to allergies blocking her nasal passages. She uses inhalers, including albuterol  and Advair, twice daily. She previously tried [place] but could not tolerate it due to throat irritation. She recalls a past sleep study, which showed minimal sleep apnea, and has not had recent PFT's.   She has ongoing issues with allergies, including watery eyes and ear pain, managed with Coricidin, loratadine, and nasal spray, providing temporary relief. Symptoms were less severe in the fall but have worsened with spring. No significant shortness of breath is noted, but there is some difficulty with physical activity, possibly due to allergies.  She recently had an ultrasound of her neck, revealing a benign thyroid nodule. She is concerned about the size of the nodule (2.8 cm), as it causes difficulty swallowing and a sensation of something being stuck in her throat. She has experienced these symptoms for a year and a half. Various tests have been conducted, but the thyroid nodule is described as a goiter-type nodule.      Review of Systems A 10 point review of systems was performed and it is as noted above otherwise negative.   Patient Active Problem List   Diagnosis Date Noted   Atelectasis, right 07/07/2023   Dysphagia 10/07/2022   Abdominal pain, epigastric 10/07/2022   S/P lobectomy of lung 03/17/2022   S/P robot-assisted surgical procedure 03/17/2022   Squamous cell carcinoma of right lung (HCC) 02/15/2022   S/P cardiac catheterization 04/06/2021   Osteoporosis without current pathological fracture 04/06/2021   Aromatase inhibitor use 04/06/2021   Special screening for malignant neoplasms, colon    Family history of colon cancer    Polyp of  descending colon    Cough 08/28/2020   Hypokalemia 07/25/2020   Urinary tract infection without hematuria 07/25/2020   Recurrent displacement of lumbar disc 04/08/2020   Essential hypertension 01/22/2020   Degenerative tear of glenoid labrum of right shoulder 07/19/2019   Nontraumatic complete tear of right rotator cuff 07/19/2019   Rotator  cuff tendinitis, right 07/19/2019   Tendinitis of upper biceps tendon of right shoulder 07/19/2019   Encounter for general adult medical examination with abnormal findings 05/29/2019   Dysuria 05/29/2019   Acute non-recurrent pansinusitis 06/01/2018   Perennial allergic rhinitis 06/01/2018   Generalized anxiety disorder 06/01/2018   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   Coronary artery disease 11/09/2017   Mixed hyperlipidemia 11/09/2017   Bilateral carotid artery stenosis 08/14/2014   Gastroesophageal reflux disease with esophagitis 08/14/2014   OSA on CPAP 07/14/2014   Weight loss of more than 10% body weight 07/14/2014   Cervical spondylosis with radiculopathy 01/20/2014   Cervical spondylosis without myelopathy 01/20/2014    Social History   Tobacco Use   Smoking status: Former    Current packs/day: 0.00    Average packs/day: 1 pack/day for 30.0 years (30.0 ttl pk-yrs)    Types: Cigarettes    Start date: 09/12/1985    Quit date: 09/13/2015    Years since quitting: 8.2   Smokeless tobacco: Never  Substance Use Topics   Alcohol use: Yes    Comment: occasional    Allergies  Allergen Reactions   Other     BANDAIDS-OF LEFT ON FOR AN EXTENDED PERIOD OF TIME  Patient states latex rubber gloves are ok    Current Meds  Medication Sig   albuterol (PROVENTIL) (2.5 MG/3ML) 0.083% nebulizer solution Take 3 mLs (2.5 mg total) by nebulization every 6 (six) hours as needed for wheezing or shortness of breath.   albuterol (VENTOLIN HFA) 108 (90 Base) MCG/ACT inhaler Inhale 1-2 puffs into the lungs every 6 (six) hours as needed for wheezing or shortness of breath.   alendronate (FOSAMAX) 70 MG tablet TAKE ONE (1) TABLET BY MOUTH ONCE A WEEK. TAKE WITH A FULL GLASS OF WATER ON AN EMPTY STOMACH. STAND OR SIT UPRIGHT FOR ONE (1) HOUR AFTER TAKING   aspirin-acetaminophen-caffeine (EXCEDRIN MIGRAINE) 250-250-65 MG tablet Take by mouth every 6 (six) hours as needed for  headache.   fluticasone (FLONASE) 50 MCG/ACT nasal spray Place 2 sprays into both nostrils daily as needed for allergies.   fluticasone-salmeterol (ADVAIR) 250-50 MCG/ACT AEPB INHALE 1 PUFF TWICE DAILY IN THE MORNING AND AT BEDTIME   folic acid (FOLVITE) 1 MG tablet Take 2 mg by mouth in the morning.   loratadine (CLARITIN) 10 MG tablet Take 10 mg by mouth daily.   methotrexate (RHEUMATREX) 2.5 MG tablet Take 20 mg by mouth once a week. WEDNESDAY   montelukast (SINGULAIR) 10 MG tablet TAKE (1) TABLET BY MOUTH  AT BEDTIME   omeprazole (PRILOSEC) 40 MG capsule TAKE (1) CAPSULE BY MOUTH EVERY DAY   potassium chloride (KLOR-CON) 10 MEQ tablet TAKE (1) TABLET BY MOUTH EVERY DAY   rosuvastatin (CRESTOR) 40 MG tablet Take 40 mg by mouth every evening.   sertraline (ZOLOFT) 100 MG tablet TAKE (1) TABLET BY MOUTH EVERY DAY   traMADol (ULTRAM) 50 MG tablet Take 1-2 tablets (50-100 mg total) by mouth 3 (three) times daily as needed.   [DISCONTINUED] ALPRAZolam (XANAX) 0.25 MG tablet TAKE ONE (1) TO TWO (2) TABLETS BY MOUTH EVERY NIGHT  Immunization History  Administered Date(s) Administered   Fluad Trivalent(High Dose 65+) 06/22/2023   Influenza Inj Mdck Quad Pf 06/26/2017, 04/24/2020, 06/03/2021, 07/25/2022   Influenza,inj,Quad PF,6+ Mos 08/02/2018   Influenza,inj,quad, With Preservative 06/17/2019   Influenza-Unspecified 06/17/2019   Moderna SARS-COV2 Booster Vaccination 08/04/2020   Moderna Sars-Covid-2 Vaccination 11/13/2019, 12/11/2019   Pneumococcal Conjugate-13 06/17/2019   Pneumococcal Polysaccharide-23 06/17/2019        Objective:     BP 112/70 (BP Location: Left Arm, Patient Position: Sitting, Cuff Size: Normal)   Pulse 92   Temp 97.7 F (36.5 C) (Temporal)   Ht 5' (1.524 m)   Wt 141 lb 12.8 oz (64.3 kg)   SpO2 97%   BMI 27.69 kg/m   SpO2: 97 %  GENERAL: Well-developed, well-nourished woman, no acute distress. Fully ambulatory.  No conversational dyspnea. HEAD:  Normocephalic, atraumatic.  EYES: Pupils equal, round, reactive to light.  No scleral icterus.  MOUTH: Oral mucosa moist.  No thrush. NECK: Supple.  Left thyroid prominent. Trachea midline. No JVD.  No adenopathy. PULMONARY: Good air entry bilaterally.  No adventitious sounds. CARDIOVASCULAR: S1 and S2. Regular rate and rhythm.  No rubs, murmurs or gallops heard. ABDOMEN: Benign. MUSCULOSKELETAL: No joint deformity, no clubbing, no edema.  NEUROLOGIC: No overt focal deficit, no gait disturbance, speech is fluent. SKIN: Intact,warm,dry. PSYCH: Normal mood, normal behavior. :    Assessment & Plan:     ICD-10-CM   1. Stage 3 severe COPD by GOLD classification (HCC)  J44.9 Pulmonary Function Test [facility] Only    2. Squamous cell carcinoma of right lung (HCC)  C34.91       Orders Placed This Encounter  Procedures   Pulmonary Function Test [facility] Only    Standing Status:   Future    Expected Date:   01/06/2024    Expiration Date:   12/05/2024    Full PFT: includes the following: basic spirometry, spirometry pre & post bronchodilator, diffusion capacity (DLCO), lung volumes:   Full PFT    This test can only be performed at:   [facility]   Discussion:    Chronic obstructive pulmonary disease (COPD) COPD managed with Advair and albuterol inhalers. Markus Daft was previously trialed but not tolerated due to throat irritation. Shortness of breath may be exacerbated by allergies. Previous lung function tests were limited to spirometry only, necessitating a new baseline to evaluate current status and potential inhaler adjustment. - Order updated lung function tests - Reassess inhaler regimen based on lung function test results  Squamous cell carcinoma Follow-up for squamous cell carcinoma with no current evidence of recurrence. Previous imaging and endoscopy were performed. A chest CT is planned for August. The previously ordered PET scan is likely no longer active. - Oncology has ordered  chest CT in August  Thyroid nodule Large benign thyroid nodule, likely a goiter, causing sensation of throat obstruction and occasional dysphagia. The nodule is sizable but non-malignant. Discussed potential for surgical removal or other interventions if bothersome. She prefers non-surgical options such as shrinking the nodule if possible. - Discuss with primary care physician or endocrinologist for potential referral to ENT for evaluation of surgical removal or other interventions  Allergic rhinitis Seasonal allergies with symptoms of watery eyes and ear discomfort, managed with Coricidin, loratadine, and nasal spray. Symptoms are persistent but not severe.  Follow-up Follow-up plans for imaging and lung function tests were discussed. - Schedule follow-up visit after chest CT and lung function tests to review results and adjust treatment as  necessary      Advised if symptoms do not improve or worsen, to please contact office for sooner follow up or seek emergency care.    I spent 33 minutes of dedicated to the care of this patient on the date of this encounter to include pre-visit review of records, face-to-face time with the patient discussing conditions above, post visit ordering of testing, clinical documentation with the electronic health record, making appropriate referrals as documented, and communicating necessary findings to members of the patients care team.     C. Danice Goltz, MD Advanced Bronchoscopy [facility]    *This note was generated using voice recognition software/Dragon and/or AI transcription program.  Despite best efforts to proofread, errors can occur which can change the meaning. Any transcriptional errors that result from this process are unintentional and may not be fully corrected at the time of dictation.

## 2023-12-06 NOTE — Patient Instructions (Signed)
 VISIT SUMMARY:  Today, we discussed your ongoing health concerns, including your chronic obstructive pulmonary disease (COPD), squamous cell carcinoma follow-up, thyroid nodule, and allergic rhinitis. We reviewed your current symptoms and treatment plans, and we have outlined the next steps for each condition.  YOUR PLAN:  -CHRONIC OBSTRUCTIVE PULMONARY DISEASE (COPD): COPD is a chronic lung condition that makes it hard to breathe. You are currently managing it with Advair and albuterol inhalers. Since your previous lung function tests were not optimal, we will order new tests to get an updated baseline and reassess your inhaler regimen based on the results.  -SQUAMOUS CELL CARCINOMA: Squamous cell carcinoma is a type of skin cancer. There is no current evidence of recurrence. We will plan for a chest CT in August to continue monitoring your condition.  -THYROID NODULE: A thyroid nodule is a growth in the thyroid gland. Your nodule is benign but large enough to cause discomfort and difficulty swallowing. We discussed the possibility of surgical removal or other interventions, and you prefer non-surgical options. We will discuss this further with your primary care physician or an endocrinologist and consider a referral to an ENT specialist.  -ALLERGIC RHINITIS: Allergic rhinitis is an allergic reaction that causes symptoms like watery eyes and ear discomfort. You are managing these symptoms with quercetin, loratadine, and nasal spray. While the symptoms are persistent, they are not severe.  INSTRUCTIONS:  Please schedule a follow-up visit after completing your chest CT and lung function tests so we can review the results and adjust your treatment as necessary.We will see you after your next CT chest.

## 2023-12-14 ENCOUNTER — Other Ambulatory Visit: Payer: Self-pay | Admitting: Physician Assistant

## 2023-12-14 ENCOUNTER — Encounter: Payer: Self-pay | Admitting: Physician Assistant

## 2023-12-14 DIAGNOSIS — F411 Generalized anxiety disorder: Secondary | ICD-10-CM

## 2023-12-14 NOTE — Telephone Encounter (Signed)
 Next 02/22/24

## 2023-12-19 DIAGNOSIS — H40019 Open angle with borderline findings, low risk, unspecified eye: Secondary | ICD-10-CM | POA: Diagnosis not present

## 2023-12-22 ENCOUNTER — Encounter: Payer: Self-pay | Admitting: *Deleted

## 2024-01-02 DIAGNOSIS — J449 Chronic obstructive pulmonary disease, unspecified: Secondary | ICD-10-CM | POA: Diagnosis not present

## 2024-01-02 DIAGNOSIS — M47812 Spondylosis without myelopathy or radiculopathy, cervical region: Secondary | ICD-10-CM | POA: Diagnosis not present

## 2024-01-02 DIAGNOSIS — E785 Hyperlipidemia, unspecified: Secondary | ICD-10-CM | POA: Diagnosis not present

## 2024-01-02 DIAGNOSIS — M5412 Radiculopathy, cervical region: Secondary | ICD-10-CM | POA: Diagnosis not present

## 2024-01-02 DIAGNOSIS — I2510 Atherosclerotic heart disease of native coronary artery without angina pectoris: Secondary | ICD-10-CM | POA: Diagnosis not present

## 2024-01-02 DIAGNOSIS — Z9861 Coronary angioplasty status: Secondary | ICD-10-CM | POA: Diagnosis not present

## 2024-01-08 ENCOUNTER — Other Ambulatory Visit: Payer: Self-pay | Admitting: Internal Medicine

## 2024-01-08 ENCOUNTER — Other Ambulatory Visit: Payer: Self-pay | Admitting: Physician Assistant

## 2024-01-08 DIAGNOSIS — E876 Hypokalemia: Secondary | ICD-10-CM

## 2024-01-15 ENCOUNTER — Encounter: Payer: Self-pay | Admitting: Oncology

## 2024-01-23 DIAGNOSIS — Z79899 Other long term (current) drug therapy: Secondary | ICD-10-CM | POA: Diagnosis not present

## 2024-01-23 DIAGNOSIS — M0579 Rheumatoid arthritis with rheumatoid factor of multiple sites without organ or systems involvement: Secondary | ICD-10-CM | POA: Diagnosis not present

## 2024-01-25 ENCOUNTER — Other Ambulatory Visit: Payer: Self-pay | Admitting: Physician Assistant

## 2024-01-25 DIAGNOSIS — J3089 Other allergic rhinitis: Secondary | ICD-10-CM

## 2024-02-17 ENCOUNTER — Other Ambulatory Visit: Payer: Self-pay | Admitting: Oncology

## 2024-02-19 ENCOUNTER — Encounter: Payer: Self-pay | Admitting: Oncology

## 2024-02-22 ENCOUNTER — Ambulatory Visit (INDEPENDENT_AMBULATORY_CARE_PROVIDER_SITE_OTHER): Payer: Medicare HMO | Admitting: Physician Assistant

## 2024-02-22 ENCOUNTER — Encounter: Payer: Self-pay | Admitting: Physician Assistant

## 2024-02-22 VITALS — BP 138/80 | HR 70 | Temp 97.8°F | Resp 16 | Ht 60.0 in | Wt 142.0 lb

## 2024-02-22 DIAGNOSIS — G4733 Obstructive sleep apnea (adult) (pediatric): Secondary | ICD-10-CM | POA: Diagnosis not present

## 2024-02-22 DIAGNOSIS — C3491 Malignant neoplasm of unspecified part of right bronchus or lung: Secondary | ICD-10-CM

## 2024-02-22 DIAGNOSIS — M5412 Radiculopathy, cervical region: Secondary | ICD-10-CM | POA: Diagnosis not present

## 2024-02-22 DIAGNOSIS — E041 Nontoxic single thyroid nodule: Secondary | ICD-10-CM

## 2024-02-22 DIAGNOSIS — J449 Chronic obstructive pulmonary disease, unspecified: Secondary | ICD-10-CM | POA: Diagnosis not present

## 2024-02-22 NOTE — Progress Notes (Signed)
 [facility] [address] [place], [place] [address]  Internal MEDICINE  Office Visit Note  Patient Name: Michelle Moore  [id number]  [id number]  Date of Service: 02/22/2024  Chief Complaint  Patient presents with   Follow-up   Depression   Anxiety   Hypertension    HPI Pt is here for routine follow up -has upcoming mammogram and bone density with oncology -Will be seeing Dr. Larrie Po tomorrow for follow up on neck pain and shoulder. Reports injections have not been helping -does have chronic fatigue which may be multifactorial, Pt did have HST done last year showing mild OSA and pt is not on treatment. Discussed that mild OSA can still have an impact and that apnea may vary nightly and may have nights with more apnea than seen on testing -She states that her pulmonologist Dr Viva Grise plans to do another breathing test soon.  -pt requests endo referral to check on thyroid  nodule as potential impact to swallowing. She has had swallow study and endoscopy previously. Thyroid  nodule is large and was biopsied-- benign but she would still like to discuss with specialist  Current Medication: Outpatient Encounter Medications as of 02/22/2024  Medication Sig   albuterol  (PROVENTIL ) (2.5 MG/3ML) 0.083% nebulizer solution Take 3 mLs (2.5 mg total) by nebulization every 6 (six) hours as needed for wheezing or shortness of breath.   albuterol  (VENTOLIN  HFA) 108 (90 Base) MCG/ACT inhaler Inhale 1-2 puffs into the lungs every 6 (six) hours as needed for wheezing or shortness of breath.   alendronate  (FOSAMAX ) 70 MG tablet TAKE ONE (1) TABLET BY MOUTH ONCE A WEEK. TAKE WITH A FULL GLASS OF WATER  ON AN EMPTY STOMACH. STAND OR SIT UPRIGHT FOR ONE (1) HOUR AFTER TAKING   ALPRAZolam  (XANAX ) 0.25 MG tablet TAKE ONE (1) TO TWO (2) TABLETS BY MOUTH EVERY NIGHT   aspirin-acetaminophen -caffeine (EXCEDRIN MIGRAINE) 250-250-65 MG tablet Take by mouth every 6 (six) hours as needed for headache.    fluticasone  (FLONASE ) 50 MCG/ACT nasal spray Place 2 sprays into both nostrils daily as needed for allergies.   fluticasone -salmeterol (ADVAIR ) 250-50 MCG/ACT AEPB INHALE 1 PUFF TWICE DAILY IN THE MORNING AND AT BEDTIME   folic acid  (FOLVITE ) 1 MG tablet Take 2 mg by mouth in the morning.   loratadine  (CLARITIN ) 10 MG tablet Take 10 mg by mouth daily.   methotrexate  (RHEUMATREX) 2.5 MG tablet Take 20 mg by mouth once a week. WEDNESDAY   montelukast  (SINGULAIR ) 10 MG tablet TAKE (1) TABLET BY MOUTH AT BEDTIME   omeprazole  (PRILOSEC) 40 MG capsule TAKE (1) CAPSULE BY MOUTH EVERY DAY   potassium chloride  (KLOR-CON ) 10 MEQ tablet TAKE (1) TABLET BY MOUTH EVERY DAY   rosuvastatin  (CRESTOR ) 40 MG tablet Take 40 mg by mouth every evening.   sertraline  (ZOLOFT ) 100 MG tablet TAKE (1) TABLET BY MOUTH EVERY DAY   traMADol  (ULTRAM ) 50 MG tablet Take 1-2 tablets (50-100 mg total) by mouth 3 (three) times daily as needed.   No facility-administered encounter medications on file as of 02/22/2024.    Surgical History: Past Surgical History:  Procedure Laterality Date   ABDOMINAL HYSTERECTOMY     ANTERIOR CERVICAL DECOMP/DISCECTOMY FUSION N/A 01/20/2014   Procedure: ANTERIOR CERVICAL DECOMPRESSION/DISCECTOMY FUSION 2 LEVELS cervical five/six six Prudy Brownie;  Surgeon: Elder Greening, MD;  Location: [facility];  Service: Neurosurgery;  Laterality: N/A;   APPENDECTOMY     BACK SURGERY     lumbar   BLADDER INSTILLATION N/A 05/02/2022  Procedure: BLADDER INSTILLATION OF GEMCITABINE ;  Surgeon: Dustin Gimenez, MD;  Location: [facility];  Service: Urology;  Laterality: N/A;   BREAST BIOPSY Right 02/01/2018   x shape, DCIS    BREAST BIOPSY  01/16/2019   coil, BENIGN MAMMARY TISSUE WITH MODERATE STROMAL FIBROSIS AND COARSE DYSTROPHIC CALCIFICATIONS of the RIGHT breast   BREAST LUMPECTOMY Right 03/07/2018   Procedure: BREAST LUMPECTOMY/ RE EXCISION;  Surgeon: Eldred Grego, MD;  Location: [facility];   Service: General;  Laterality: Right;   CARDIAC CATHETERIZATION Left 07/31/2014   Procedure: CARDIAC CATHETERIZATION; Location: [facility]; Surgeon: Thomasene Flemings, MD   COLONOSCOPY WITH PROPOFOL  N/A 02/11/2021   Procedure: COLONOSCOPY WITH PROPOFOL ;  Surgeon: Marnee Sink, MD;  Location: [facility];  Service: Endoscopy;  Laterality: N/A;  Requests Early   CORONARY ANGIOPLASTY WITH STENT PLACEMENT Left 10/16/2009   Procedure: CORONARY ANGIOPLASTY WITH STENT PLACEMENT; Location: [facility]; Surgeon: Thais Fill, MD   CYSTOSCOPY W/ RETROGRADES Bilateral 05/02/2022   Procedure: CYSTOSCOPY WITH RETROGRADE PYELOGRAM;  Surgeon: Dustin Gimenez, MD;  Location: [facility];  Service: Urology;  Laterality: Bilateral;   ENDOBRONCHIAL ULTRASOUND Right 07/07/2023   Procedure: ENDOBRONCHIAL ULTRASOUND;  Surgeon: Marc Senior, MD;  Location: [facility];  Service: Pulmonary;  Laterality: Right;   ESOPHAGOGASTRODUODENOSCOPY (EGD) WITH PROPOFOL  N/A 10/07/2022   Procedure: ESOPHAGOGASTRODUODENOSCOPY (EGD) WITH PROPOFOL ;  Surgeon: Marnee Sink, MD;  Location: [facility];  Service: Endoscopy;  Laterality: N/A;  sleep apnea   FLEXIBLE BRONCHOSCOPY Right 07/07/2023   Procedure: FLEXIBLE BRONCHOSCOPY;  Surgeon: Marc Senior, MD;  Location: [facility];  Service: Pulmonary;  Laterality: Right;   INTERCOSTAL NERVE BLOCK Right 03/17/2022   Procedure: INTERCOSTAL NERVE BLOCK;  Surgeon: Zelphia Higashi, MD;  Location: [facility] OR;  Service: Thoracic;  Laterality: Right;   LASIK Bilateral    LUNG LOBECTOMY (RIGHT UPPER LOBE) Right 03/17/2022   LYMPH NODE DISSECTION Right 03/17/2022   Procedure: LYMPH NODE DISSECTION;  Surgeon: Zelphia Higashi, MD;  Location: [facility] OR;  Service: Thoracic;  Laterality: Right;   PARTIAL MASTECTOMY WITH NEEDLE LOCALIZATION Right 02/21/2018   Procedure: PARTIAL MASTECTOMY WITH NEEDLE LOCALIZATION;  Surgeon: Eldred Grego, MD;  Location: [facility];  Service: General;   Laterality: Right;   POLYPECTOMY N/A 02/11/2021   Procedure: POLYPECTOMY;  Surgeon: Marnee Sink, MD;  Location: [facility];  Service: Endoscopy;  Laterality: N/A;   ROTATOR CUFF REPAIR Left    SHOULDER ARTHROSCOPY WITH ROTATOR CUFF REPAIR AND OPEN BICEPS TENODESIS Right 08/07/2019   Procedure: SHOULDER ARTHROSCOPY WITH DEBRIDEMENT, DECOMPRESSION, ROTATOR CUFF REPAIR AND BICEPS TENODESIS. - RNFA;  Surgeon: Elner Hahn, MD;  Location: [facility];  Service: Orthopedics;  Laterality: Right;   TOE SURGERY Right    has pin and plate in it   TRANSURETHRAL RESECTION OF BLADDER TUMOR N/A 05/02/2022   Procedure: TRANSURETHRAL RESECTION OF BLADDER TUMOR (TURBT);  Surgeon: Dustin Gimenez, MD;  Location: [facility];  Service: Urology;  Laterality: N/A;   VIDEO BRONCHOSCOPY WITH ENDOBRONCHIAL ULTRASOUND N/A 03/02/2022   Procedure: VIDEO BRONCHOSCOPY WITH ENDOBRONCHIAL ULTRASOUND;  Surgeon: Marc Senior, MD;  Location: [facility];  Service: Cardiopulmonary;  Laterality: N/A;    Medical History: Past Medical History:  Diagnosis Date   Adenoma of right adrenal gland 12/07/2021   a.) CT chest 12/07/2021; measured 2.3 cm; not FDG avid on 01/06/2022 PET CT.   Anxiety    a.) uses BZO (alprazolam ) PRN   Aortic atherosclerosis (HCC)    Arthritis    Bilateral carotid artery stenosis  Bladder mass 12/07/2021   a.) CT abd 12/07/2021 --> 9 x 8 mm nodular enhancing focus at the base of the bladder; suspicious for urothelial neoplasm; b.) s/p TURBT 05/02/2022 --> pathology (-) for high grade dysplasia/malignancy   Cervical spondylosis without myelopathy    a.) s/p ACDF C5-C7   COPD (chronic obstructive pulmonary disease) (HCC)    Coronary artery disease 10/16/2009   a.) LHC/PCI 10/16/2009: EF 60%; 50% OM1, 99% dRCA (3.0 x 18 mm Xience V DES). b.) LHC 07/31/2014: EF 65%; 40% pLAD, 20% pLCx, 40% OM1, 30% oRCA, 50% pRCA, 20% mRCA, 10% ISR stent to dRCA; further intervention deferred opting for med.  mgmt.   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 b.) s/p lumpectomy, adjuvant XRT; currently on extended AI (anastrozole ) therapy   Essential hypertension    GERD (gastroesophageal reflux disease)    Hepatic steatosis    History of 2019 novel coronavirus disease (COVID-19) 04/2023   HLD (hyperlipidemia)    Left thyroid  nodule 12/07/2021   a.) CT chest 12/07/2021: 2.6 cm; b.) CT chest 06/05/2023: hypoattenuating --> 2.5 cm   Long term current use of immunosuppressive drug    a.) MTX for RA.   OSA on CPAP    currently not using   Osteopenia    Personal history of radiation therapy    Pneumonia    PRES (posterior reversible encephalopathy syndrome) 07/14/2014   Pulmonary nodules 11/08/2021   a.) CXR 11/08/2021: 18mm nodular density RUL. b.) CT chest 12/07/2021: 1.9 x 1.4 x 2.5 spiculated lesion anterior RUL. c.) PET CT 01/06/2022: 2.2 x 1.9 spiculated anterior RUL mass (SUV max 16.4); d.) Bx (+) NSCLC, favoring SCC with tumor necrosis; p40/CK7/GATA3 (+), TTF-1 (-)   Rheumatoid arthritis (HCC)    a.) on MTX   Seasonal allergies    Seizures (HCC) 2014   a.) x 1 episode; etiology never determined.   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   Tubular adenoma     Family History: Family History  Problem Relation Age of Onset   Breast cancer Cousin 51       bilateral at 24 and 59; daughter of maternal aunt who was unaffected   Lung cancer Maternal Aunt        dx 51s;  deceased 44s; smoker   Heart attack Father        deceased 25   Stomach cancer Maternal Grandmother     Social History   Socioeconomic History   Marital status: Married    Spouse name: Jimmy   Number of children: 1   Years of education: Not on file   Highest education level: Not on file  Occupational History   Not on file  Tobacco Use   Smoking status: Former    Current packs/day: 0.00    Average packs/day: 1 pack/day for 30.0 years (30.0 ttl pk-yrs)    Types: Cigarettes    Start date: 09/12/1985  Quit date: 09/13/2015    Years since quitting: 8.4   Smokeless tobacco: Never  Vaping Use   Vaping status: Never Used  Substance and Sexual Activity   Alcohol use: Yes    Comment: occasional   Drug use: No   Sexual activity: Not on file  Other Topics Concern   Not on file  Social History Narrative   Not on file   Social Drivers of Health   Financial Resource Strain: Low Risk  (03/31/2022)   Overall Financial Resource Strain (CARDIA)    Difficulty of Paying Living Expenses: Not very hard  Food Insecurity: No Food Insecurity (11/08/2023)   Hunger Vital Sign    Worried About Running Out of Food in the Last Year: Never true    Ran Out of Food in the Last Year: Never true  Transportation Needs: No Transportation Needs (11/08/2023)   PRAPARE - Administrator, Civil Service (Medical): No    Lack of Transportation (Non-Medical): No  Physical Activity: Inactive (03/31/2022)   Exercise Vital Sign    Days of Exercise per Week: 0 days    Minutes of Exercise per Session: 0 min  Stress: No Stress Concern Present (03/31/2022)   [employer] of Occupational Health - Occupational Stress Questionnaire    Feeling of Stress : Only a little  Social Connections: Socially Integrated (03/31/2022)   Social Connection and Isolation Panel    Frequency of Communication with Friends and Family: Three times a week    Frequency of Social Gatherings with Friends and Family: Three times a  week    Attends Religious Services: More than 4 times per year    Active Member of Clubs or Organizations: Yes    Attends Banker Meetings: More than 4 times per year    Marital Status: Married  Catering manager Violence: Not At Risk (11/08/2023)   Humiliation, Afraid, Rape, and Kick questionnaire    Fear of Current or Ex-Partner: No    Emotionally Abused: No    Physically Abused: No    Sexually Abused: No      Review of Systems  Constitutional:  Positive for fatigue. Negative for chills and unexpected weight change.  HENT:  Positive for trouble swallowing. Negative for congestion, rhinorrhea, sneezing and sore throat.   Eyes:  Negative for redness.  Respiratory:  Negative for cough and chest tightness.   Cardiovascular:  Negative for chest pain and palpitations.  Gastrointestinal:  Negative for abdominal pain, constipation, diarrhea, nausea and vomiting.  Genitourinary:  Negative for dysuria and frequency.  Musculoskeletal:  Negative for arthralgias, back pain, joint swelling and neck pain.  Skin:  Negative for rash.  Neurological: Negative.  Negative for tremors and numbness.  Hematological:  Negative for adenopathy. Does not bruise/bleed easily.  Psychiatric/Behavioral:  Negative for behavioral problems (Depression), sleep disturbance and suicidal ideas. The patient is not nervous/anxious.     Vital Signs: BP 138/80   Pulse 70   Temp 97.8 F (36.6 C)   Resp 16   Ht 5' (1.524 m)   Wt 142 lb (64.4 kg)   SpO2 98%   BMI 27.73 kg/m    Physical Exam Vitals and nursing note reviewed.  Constitutional:      Appearance: Normal appearance.  HENT:     Head: Normocephalic and atraumatic.   Eyes:     Extraocular Movements: Extraocular movements intact.    Cardiovascular:     Rate and Rhythm: Normal rate and regular rhythm.  Pulmonary:  Effort: Pulmonary effort is normal.     Breath sounds: Normal breath sounds.   Skin:    General: Skin is warm and dry.    Neurological:     Mental Status: She is alert and oriented to person, place, and time.   Psychiatric:        Mood and Affect: Mood normal.        Assessment/Plan: 1. Thyroid  nodule (Primary) Previous bx benign, however given size pt would like to discuss with endo and requests referral - Ambulatory referral to Endocrinology  2. Squamous cell carcinoma of right lung (HCC) Followed by oncology  3. Stage 3 severe COPD by GOLD classification (HCC) Followed by pulmonology and reports upcoming breathing test  4. Cervical radiculitis Will be seeing Dr. Larrie Po for further evaluation  5. OSA (obstructive sleep apnea) Discussed considering treatment despite mild finding as this may contribute to fatigue   General Counseling: taquisha phung understanding of the findings of todays visit and agrees with plan of treatment. I have discussed any further diagnostic evaluation that may be needed or ordered today. We also reviewed her medications today. she has been encouraged to call the office with any questions or concerns that should arise related to todays visit.    Orders Placed This Encounter  Procedures   Ambulatory referral to Endocrinology    No orders of the defined types were placed in this encounter.   This patient was seen by Taylor Favia, PA-C in collaboration with Dr. Verneta Gone as a part of collaborative care agreement.   Total time spent:30 Minutes Time spent includes review of chart, medications, test results, and follow up plan with the patient.      Dr Fozia M Khan Internal medicine

## 2024-02-23 DIAGNOSIS — M5412 Radiculopathy, cervical region: Secondary | ICD-10-CM | POA: Diagnosis not present

## 2024-02-23 DIAGNOSIS — M4802 Spinal stenosis, cervical region: Secondary | ICD-10-CM | POA: Diagnosis not present

## 2024-02-23 DIAGNOSIS — M4722 Other spondylosis with radiculopathy, cervical region: Secondary | ICD-10-CM | POA: Diagnosis not present

## 2024-02-26 ENCOUNTER — Telehealth: Payer: Self-pay | Admitting: Physician Assistant

## 2024-02-26 NOTE — Telephone Encounter (Signed)
 Endocrinology sent via [facility] to [facility] per patient request-[place]

## 2024-03-14 ENCOUNTER — Telehealth: Payer: Self-pay | Admitting: Physician Assistant

## 2024-03-14 NOTE — Telephone Encounter (Signed)
 Endocrinology appointment 04/18/2024 @ [facility]

## 2024-03-21 ENCOUNTER — Other Ambulatory Visit

## 2024-03-21 ENCOUNTER — Encounter

## 2024-04-02 ENCOUNTER — Other Ambulatory Visit: Payer: Self-pay | Admitting: Internal Medicine

## 2024-04-08 ENCOUNTER — Other Ambulatory Visit: Payer: Self-pay | Admitting: Internal Medicine

## 2024-04-09 ENCOUNTER — Encounter: Payer: Self-pay | Admitting: Nurse Practitioner

## 2024-04-09 ENCOUNTER — Ambulatory Visit (INDEPENDENT_AMBULATORY_CARE_PROVIDER_SITE_OTHER): Admitting: Nurse Practitioner

## 2024-04-09 VITALS — BP 120/72 | HR 105 | Temp 96.7°F | Resp 16 | Ht 60.0 in | Wt 139.4 lb

## 2024-04-09 DIAGNOSIS — M542 Cervicalgia: Secondary | ICD-10-CM | POA: Diagnosis not present

## 2024-04-09 DIAGNOSIS — B9689 Other specified bacterial agents as the cause of diseases classified elsewhere: Secondary | ICD-10-CM

## 2024-04-09 DIAGNOSIS — J208 Acute bronchitis due to other specified organisms: Secondary | ICD-10-CM | POA: Diagnosis not present

## 2024-04-09 DIAGNOSIS — M7918 Myalgia, other site: Secondary | ICD-10-CM

## 2024-04-09 MED ORDER — PREDNISONE 10 MG PO TABS: ORAL_TABLET | ORAL | 0 refills | Status: DC

## 2024-04-09 MED ORDER — AMOXICILLIN-POT CLAVULANATE 875-125 MG PO TABS: 1.0000 | ORAL_TABLET | Freq: Two times a day (BID) | ORAL | 0 refills | Status: AC

## 2024-04-09 MED ORDER — CYCLOBENZAPRINE HCL 10 MG PO TABS: 10.0000 mg | ORAL_TABLET | Freq: Every evening | ORAL | 1 refills | Status: DC | PRN

## 2024-04-09 NOTE — Progress Notes (Signed)
 [facility] [address] [place], [place] [address]  Internal MEDICINE  Office Visit Note  Patient Name: Michelle Moore  [id number]  [id number]  Date of Service: 04/09/2024  Chief Complaint  Patient presents with   Acute Visit    Sore throat, breathing not to good, pulled a nerve on top of back.      HPI Sharell presents for an acute sick visit for URI symptoms  Sore throat, chest congestion and fatigue -- also reports wheezing and cough. Spent all day out in the pool on Saturday which was a very hot day -- possibly dehydrated. Has been drinking pedialyte to rehydrate.  Muscle spasms in neck and back.    Current Medication:  Outpatient Encounter Medications as of 04/09/2024  Medication Sig   amoxicillin -clavulanate (AUGMENTIN ) 875-125 MG tablet Take 1 tablet by mouth 2 (two) times daily for 10 days. Take with food   albuterol  (PROVENTIL ) (2.5 MG/3ML) 0.083% nebulizer solution Take 3 mLs (2.5 mg total) by nebulization every 6 (six) hours as needed for wheezing or shortness of breath.   albuterol  (VENTOLIN  HFA) 108 (90 Base) MCG/ACT inhaler Inhale 1-2 puffs into the lungs every 6 (six) hours as needed for wheezing or shortness of breath.   alendronate  (FOSAMAX ) 70 MG tablet TAKE ONE (1) TABLET BY MOUTH ONCE A WEEK. TAKE WITH A FULL GLASS OF WATER  ON AN EMPTY STOMACH. STAND OR SIT UPRIGHT FOR ONE (1) HOUR AFTER TAKING   ALPRAZolam  (XANAX ) 0.25 MG tablet TAKE ONE (1) TO TWO (2) TABLETS BY MOUTH EVERY NIGHT   aspirin-acetaminophen -caffeine (EXCEDRIN MIGRAINE) 250-250-65 MG tablet Take by mouth every 6 (six) hours as needed for headache.   cyclobenzaprine  (FLEXERIL ) 10 MG tablet Take 1 tablet (10 mg total) by mouth at bedtime as needed for muscle spasms.   fluticasone  (FLONASE ) 50 MCG/ACT nasal spray Place 2 sprays into both nostrils daily as needed for allergies.   fluticasone -salmeterol (ADVAIR ) 250-50 MCG/ACT AEPB INHALE 1 PUFF TWICE DAILY IN THE MORNING AND AT BEDTIME    folic acid  (FOLVITE ) 1 MG tablet Take 2 mg by mouth in the morning.   loratadine  (CLARITIN ) 10 MG tablet Take 10 mg by mouth daily.   methotrexate  (RHEUMATREX) 2.5 MG tablet Take 20 mg by mouth once a week. WEDNESDAY   montelukast  (SINGULAIR ) 10 MG tablet TAKE (1) TABLET BY MOUTH AT BEDTIME   omeprazole  (PRILOSEC) 40 MG capsule TAKE (1) CAPSULE BY MOUTH EVERY DAY   potassium chloride  (KLOR-CON ) 10 MEQ tablet TAKE (1) TABLET BY MOUTH EVERY DAY   predniSONE  (DELTASONE ) 10 MG tablet Take 1 tab po 3 x day for 3 days then take 1 tab po 2 x a day for 3 days and then take 1 tab po daily for 3 days   rosuvastatin  (CRESTOR ) 40 MG tablet Take 40 mg by mouth every evening.   sertraline  (ZOLOFT ) 100 MG tablet TAKE (1) TABLET BY MOUTH EVERY DAY   traMADol  (ULTRAM ) 50 MG tablet Take 1-2 tablets (50-100 mg total) by mouth 3 (three) times daily as needed.   No facility-administered encounter medications on file as of 04/09/2024.      Medical History: Past Medical History:  Diagnosis Date   Adenoma of right adrenal gland 12/07/2021   a.) CT chest 12/07/2021; measured 2.3 cm; not FDG avid on 01/06/2022 PET CT.   Anxiety    a.) uses BZO (alprazolam ) PRN   Aortic atherosclerosis (HCC)    Arthritis    Bilateral carotid artery stenosis  Bladder mass 12/07/2021   a.) CT abd 12/07/2021 --> 9 x 8 mm nodular enhancing focus at the base of the bladder; suspicious for urothelial neoplasm; b.) s/p TURBT 05/02/2022 --> pathology (-) for high grade dysplasia/malignancy   Cervical spondylosis without myelopathy    a.) s/p ACDF C5-C7   COPD (chronic obstructive pulmonary disease) (HCC)    Coronary artery disease 10/16/2009   a.) LHC/PCI 10/16/2009: EF 60%; 50% OM1, 99% dRCA (3.0 x 18 mm Xience V DES). b.) LHC 07/31/2014: EF 65%; 40% pLAD, 20% pLCx, 40% OM1, 30% oRCA, 50% pRCA, 20% mRCA, 10% ISR stent to dRCA; further intervention deferred opting for med. mgmt.   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 b.) s/p lumpectomy, adjuvant XRT; currently on extended AI (anastrozole ) therapy   Essential hypertension    GERD (gastroesophageal reflux disease)    Hepatic steatosis    History of 2019 novel coronavirus disease (COVID-19) 04/2023   HLD (hyperlipidemia)    Left thyroid  nodule 12/07/2021   a.) CT chest 12/07/2021: 2.6 cm; b.) CT chest 06/05/2023: hypoattenuating --> 2.5 cm   Long term current use of immunosuppressive drug    a.) MTX for RA.   OSA on CPAP    currently not using   Osteopenia    Personal history of radiation therapy    Pneumonia    PRES (posterior reversible encephalopathy syndrome) 07/14/2014   Pulmonary nodules 11/08/2021   a.) CXR 11/08/2021: 18mm nodular density RUL. b.) CT chest 12/07/2021: 1.9 x 1.4 x 2.5 spiculated lesion anterior RUL. c.) PET CT 01/06/2022: 2.2 x 1.9 spiculated anterior RUL mass (SUV max 16.4); d.) Bx (+) NSCLC, favoring SCC with tumor necrosis; p40/CK7/GATA3 (+), TTF-1 (-)   Rheumatoid arthritis (HCC)    a.) on MTX   Seasonal allergies    Seizures (HCC) 2014   a.) x 1 episode; etiology never determined.   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   Tubular adenoma      Vital Signs: BP 120/72   Pulse (!) 105   Temp (!) 96.7 F (35.9 C)   Resp 16   Ht 5' (1.524 m)   Wt 139 lb 6.4 oz (63.2 kg)   SpO2 96%   BMI 27.22 kg/m    Review of Systems  Constitutional:  Positive for fatigue. Negative for chills, fever and unexpected weight  change.  HENT:  Positive for congestion, postnasal drip and sinus pressure. Negative for rhinorrhea, sneezing and sore throat.   Eyes:  Negative for redness.  Respiratory:  Positive for cough, chest tightness, shortness of breath and wheezing.   Cardiovascular:  Negative for chest pain and palpitations.  Gastrointestinal:  Negative for abdominal pain, constipation, diarrhea, nausea and vomiting.  Genitourinary:  Negative for dysuria and frequency.  Musculoskeletal:  Negative for arthralgias, back pain, joint swelling and neck pain.  Skin:  Negative for rash.  Neurological: Negative.  Negative for tremors and  numbness.  Hematological:  Negative for adenopathy. Does not bruise/bleed easily.  Psychiatric/Behavioral:  Negative for behavioral problems (Depression), sleep disturbance and suicidal ideas. The patient is not nervous/anxious.     Physical Exam Vitals and nursing note reviewed.  Constitutional:      Appearance: Normal appearance.  HENT:     Head: Normocephalic and atraumatic.     Right Ear: Tympanic membrane normal.     Left Ear: Tympanic membrane normal.     Nose: Congestion present.  Eyes:     Pupils: Pupils are equal, round, and reactive to light.  Cardiovascular:     Rate and Rhythm: Normal rate and regular rhythm.  Pulmonary:     Effort: Pulmonary effort is normal. No accessory muscle usage or respiratory distress.     Breath sounds: Normal air entry. Examination of the right-upper field reveals wheezing. Examination of the left-upper field reveals wheezing. Wheezing present.  Neurological:     Mental Status: She is alert and oriented to person, place, and time.  Psychiatric:        Mood and Affect: Mood normal.       Assessment/Plan: 1. Acute bacterial bronchitis (Primary) Take augmentin  until gone as prescribed. Take prednisone  taper as prescribed. - amoxicillin -clavulanate (AUGMENTIN ) 875-125 MG tablet; Take 1 tablet by mouth 2 (two) times daily for 10 days.  Take with food  Dispense: 20 tablet; Refill: 0 - predniSONE  (DELTASONE ) 10 MG tablet; Take 1 tab po 3 x day for 3 days then take 1 tab po 2 x a day for 3 days and then take 1 tab po daily for 3 days  Dispense: 18 tablet; Refill: 0  2. Myalgia, multiple sites Take cyclobenzaprine  as needed as prescribed.  - cyclobenzaprine  (FLEXERIL ) 10 MG tablet; Take 1 tablet (10 mg total) by mouth at bedtime as needed for muscle spasms.  Dispense: 30 tablet; Refill: 1  3. Neck pain on right side Take cyclobenzaprine  as needed as prescribed.  - cyclobenzaprine  (FLEXERIL ) 10 MG tablet; Take 1 tablet (10 mg total) by mouth at bedtime as needed for muscle spasms.  Dispense: 30 tablet; Refill: 1   General Counseling: Yasuko verbalizes understanding of the findings of todays visit and agrees with plan of treatment. I have discussed any further diagnostic evaluation that may be needed or ordered today. We also reviewed her medications today. she has been encouraged to call the office with any questions or concerns that should arise related to todays visit.    Counseling:    No orders of the defined types were placed in this encounter.   Meds ordered this encounter  Medications   amoxicillin -clavulanate (AUGMENTIN ) 875-125 MG tablet    Sig: Take 1 tablet by mouth 2 (two) times daily for 10 days. Take with food    Dispense:  20 tablet    Refill:  0    Fill new script today   predniSONE  (DELTASONE ) 10 MG tablet    Sig: Take 1 tab po 3 x day for 3 days then take 1 tab po 2 x a day for 3 days and then take 1 tab po daily for 3 days    Dispense:  18 tablet    Refill:  0   cyclobenzaprine  (FLEXERIL ) 10 MG tablet    Sig: Take 1 tablet (10 mg total) by mouth at bedtime as needed for muscle spasms.    Dispense:  30 tablet    Refill:  1    Return if symptoms worsen or fail to  improve.  [place] Controlled Substance Database was reviewed by me for overdose risk score (ORS)  Time spent:30 Minutes Time spent with  patient included reviewing progress notes, labs, imaging studies, and discussing plan for follow up.   This patient was seen by Mardy Maxin, FNP-C in collaboration with Dr. Sigrid Bathe as a part of collaborative care agreement.  Latravia Southgate R. Maxin, MSN, FNP-C Internal Medicine

## 2024-04-16 ENCOUNTER — Ambulatory Visit
Admission: RE | Admit: 2024-04-16 | Discharge: 2024-04-16 | Disposition: A | Discharge status: Home / Self Care | Source: Ambulatory Visit | Attending: Oncology | Admitting: Oncology

## 2024-04-16 DIAGNOSIS — C3491 Malignant neoplasm of unspecified part of right bronchus or lung: Secondary | ICD-10-CM | POA: Diagnosis not present

## 2024-04-16 DIAGNOSIS — Z13820 Encounter for screening for osteoporosis: Secondary | ICD-10-CM | POA: Insufficient documentation

## 2024-04-16 DIAGNOSIS — Z780 Asymptomatic menopausal state: Secondary | ICD-10-CM | POA: Diagnosis not present

## 2024-04-16 DIAGNOSIS — M8589 Other specified disorders of bone density and structure, multiple sites: Secondary | ICD-10-CM | POA: Diagnosis not present

## 2024-04-16 DIAGNOSIS — Z1231 Encounter for screening mammogram for malignant neoplasm of breast: Secondary | ICD-10-CM | POA: Insufficient documentation

## 2024-04-16 DIAGNOSIS — M8580 Other specified disorders of bone density and structure, unspecified site: Secondary | ICD-10-CM | POA: Diagnosis present

## 2024-04-18 DIAGNOSIS — E041 Nontoxic single thyroid nodule: Secondary | ICD-10-CM | POA: Diagnosis not present

## 2024-04-25 ENCOUNTER — Other Ambulatory Visit: Payer: Self-pay | Admitting: Physician Assistant

## 2024-04-25 DIAGNOSIS — J3089 Other allergic rhinitis: Secondary | ICD-10-CM

## 2024-04-26 ENCOUNTER — Other Ambulatory Visit: Payer: Self-pay

## 2024-04-26 DIAGNOSIS — R0602 Shortness of breath: Secondary | ICD-10-CM

## 2024-04-29 ENCOUNTER — Ambulatory Visit: Admitting: Pulmonary Disease

## 2024-04-29 DIAGNOSIS — J449 Chronic obstructive pulmonary disease, unspecified: Secondary | ICD-10-CM

## 2024-04-29 DIAGNOSIS — R0602 Shortness of breath: Secondary | ICD-10-CM

## 2024-04-29 LAB — PULMONARY FUNCTION TEST
DL/VA % pred: 68 %
DL/VA: 2.95 ml/min/mmHg/L
DLCO unc % pred: 52 %
DLCO unc: 8.88 ml/min/mmHg
FEF 25-75 Post: 0.79 L/s
FEF 25-75 Pre: 0.79 L/s
FEF2575-%Change-Post: 0 %
FEF2575-%Pred-Post: 43 %
FEF2575-%Pred-Pre: 43 %
FEV1-%Change-Post: -1 %
FEV1-%Pred-Post: 62 %
FEV1-%Pred-Pre: 63 %
FEV1-Post: 1.25 L
FEV1-Pre: 1.27 L
FEV1FVC-%Change-Post: -2 %
FEV1FVC-%Pred-Pre: 91 %
FEV6-%Change-Post: 1 %
FEV6-%Pred-Post: 73 %
FEV6-%Pred-Pre: 71 %
FEV6-Post: 1.83 L
FEV6-Pre: 1.8 L
FEV6FVC-%Change-Post: 0 %
FEV6FVC-%Pred-Post: 104 %
FEV6FVC-%Pred-Pre: 104 %
FVC-%Change-Post: 1 %
FVC-%Pred-Post: 69 %
FVC-%Pred-Pre: 69 %
FVC-Post: 1.83 L
FVC-Pre: 1.81 L
Post FEV1/FVC ratio: 68 %
Post FEV6/FVC ratio: 100 %
Pre FEV1/FVC ratio: 70 %
Pre FEV6/FVC Ratio: 100 %
RV % pred: 106 %
RV: 2.05 L
TLC % pred: 88 %
TLC: 3.96 L

## 2024-04-29 NOTE — Progress Notes (Signed)
 Full PFT completed today ? ?

## 2024-04-29 NOTE — Patient Instructions (Addendum)
 Full PFT completed today. Pleth done before and after albuterol .

## 2024-05-01 DIAGNOSIS — M5137 Other intervertebral disc degeneration, lumbosacral region with discogenic back pain only: Secondary | ICD-10-CM | POA: Diagnosis not present

## 2024-05-01 DIAGNOSIS — M9905 Segmental and somatic dysfunction of pelvic region: Secondary | ICD-10-CM | POA: Diagnosis not present

## 2024-05-01 DIAGNOSIS — M9903 Segmental and somatic dysfunction of lumbar region: Secondary | ICD-10-CM | POA: Diagnosis not present

## 2024-05-01 DIAGNOSIS — M546 Pain in thoracic spine: Secondary | ICD-10-CM | POA: Diagnosis not present

## 2024-05-01 DIAGNOSIS — M9902 Segmental and somatic dysfunction of thoracic region: Secondary | ICD-10-CM | POA: Diagnosis not present

## 2024-05-07 ENCOUNTER — Other Ambulatory Visit: Payer: Self-pay | Admitting: Internal Medicine

## 2024-05-07 ENCOUNTER — Ambulatory Visit
Admission: RE | Admit: 2024-05-07 | Discharge: 2024-05-07 | Disposition: A | Discharge status: Home / Self Care | Payer: Medicare HMO | Source: Ambulatory Visit | Attending: Oncology | Admitting: Oncology

## 2024-05-07 ENCOUNTER — Encounter: Payer: Self-pay | Admitting: Urology

## 2024-05-07 ENCOUNTER — Other Ambulatory Visit: Payer: Self-pay | Admitting: Nurse Practitioner

## 2024-05-07 DIAGNOSIS — C3491 Malignant neoplasm of unspecified part of right bronchus or lung: Secondary | ICD-10-CM | POA: Diagnosis not present

## 2024-05-07 DIAGNOSIS — M9905 Segmental and somatic dysfunction of pelvic region: Secondary | ICD-10-CM | POA: Diagnosis not present

## 2024-05-07 DIAGNOSIS — M9903 Segmental and somatic dysfunction of lumbar region: Secondary | ICD-10-CM | POA: Diagnosis not present

## 2024-05-07 DIAGNOSIS — M542 Cervicalgia: Secondary | ICD-10-CM

## 2024-05-07 DIAGNOSIS — M546 Pain in thoracic spine: Secondary | ICD-10-CM | POA: Diagnosis not present

## 2024-05-07 DIAGNOSIS — E041 Nontoxic single thyroid nodule: Secondary | ICD-10-CM | POA: Diagnosis not present

## 2024-05-07 DIAGNOSIS — M9902 Segmental and somatic dysfunction of thoracic region: Secondary | ICD-10-CM | POA: Diagnosis not present

## 2024-05-07 DIAGNOSIS — I700 Atherosclerosis of aorta: Secondary | ICD-10-CM | POA: Diagnosis not present

## 2024-05-07 DIAGNOSIS — M7918 Myalgia, other site: Secondary | ICD-10-CM

## 2024-05-07 DIAGNOSIS — M5137 Other intervertebral disc degeneration, lumbosacral region with discogenic back pain only: Secondary | ICD-10-CM | POA: Diagnosis not present

## 2024-05-07 DIAGNOSIS — F411 Generalized anxiety disorder: Secondary | ICD-10-CM

## 2024-05-07 MED ORDER — IOHEXOL 300 MG/ML  SOLN
75.0000 mL | Freq: Once | INTRAMUSCULAR | Status: AC | PRN
  Administered 2024-05-07: 75 mL via INTRAVENOUS

## 2024-05-09 DIAGNOSIS — M5137 Other intervertebral disc degeneration, lumbosacral region with discogenic back pain only: Secondary | ICD-10-CM | POA: Diagnosis not present

## 2024-05-09 DIAGNOSIS — M9902 Segmental and somatic dysfunction of thoracic region: Secondary | ICD-10-CM | POA: Diagnosis not present

## 2024-05-09 DIAGNOSIS — M546 Pain in thoracic spine: Secondary | ICD-10-CM | POA: Diagnosis not present

## 2024-05-09 DIAGNOSIS — M9903 Segmental and somatic dysfunction of lumbar region: Secondary | ICD-10-CM | POA: Diagnosis not present

## 2024-05-09 DIAGNOSIS — M9905 Segmental and somatic dysfunction of pelvic region: Secondary | ICD-10-CM | POA: Diagnosis not present

## 2024-05-14 ENCOUNTER — Ambulatory Visit: Admitting: Pulmonary Disease

## 2024-05-14 ENCOUNTER — Encounter: Payer: Self-pay | Admitting: Oncology

## 2024-05-14 ENCOUNTER — Encounter: Payer: Self-pay | Admitting: Pulmonary Disease

## 2024-05-14 ENCOUNTER — Inpatient Hospital Stay: Payer: Medicare HMO | Attending: Oncology | Admitting: Oncology

## 2024-05-14 VITALS — BP 130/100 | HR 89 | Temp 97.6°F | Ht 60.0 in | Wt 137.4 lb

## 2024-05-14 VITALS — BP 128/82 | HR 84 | Temp 97.8°F | Resp 18 | Ht 60.0 in | Wt 137.0 lb

## 2024-05-14 DIAGNOSIS — Z7983 Long term (current) use of bisphosphonates: Secondary | ICD-10-CM | POA: Insufficient documentation

## 2024-05-14 DIAGNOSIS — Z800 Family history of malignant neoplasm of digestive organs: Secondary | ICD-10-CM | POA: Diagnosis not present

## 2024-05-14 DIAGNOSIS — Z803 Family history of malignant neoplasm of breast: Secondary | ICD-10-CM | POA: Diagnosis not present

## 2024-05-14 DIAGNOSIS — Z87891 Personal history of nicotine dependence: Secondary | ICD-10-CM | POA: Insufficient documentation

## 2024-05-14 DIAGNOSIS — J449 Chronic obstructive pulmonary disease, unspecified: Secondary | ICD-10-CM

## 2024-05-14 DIAGNOSIS — Z79899 Other long term (current) drug therapy: Secondary | ICD-10-CM | POA: Insufficient documentation

## 2024-05-14 DIAGNOSIS — J301 Allergic rhinitis due to pollen: Secondary | ICD-10-CM

## 2024-05-14 DIAGNOSIS — Z9011 Acquired absence of right breast and nipple: Secondary | ICD-10-CM | POA: Insufficient documentation

## 2024-05-14 DIAGNOSIS — Z923 Personal history of irradiation: Secondary | ICD-10-CM | POA: Insufficient documentation

## 2024-05-14 DIAGNOSIS — Z7952 Long term (current) use of systemic steroids: Secondary | ICD-10-CM | POA: Insufficient documentation

## 2024-05-14 DIAGNOSIS — C3491 Malignant neoplasm of unspecified part of right bronchus or lung: Secondary | ICD-10-CM | POA: Diagnosis not present

## 2024-05-14 DIAGNOSIS — G4733 Obstructive sleep apnea (adult) (pediatric): Secondary | ICD-10-CM | POA: Diagnosis not present

## 2024-05-14 DIAGNOSIS — Z902 Acquired absence of lung [part of]: Secondary | ICD-10-CM | POA: Diagnosis not present

## 2024-05-14 DIAGNOSIS — G4730 Sleep apnea, unspecified: Secondary | ICD-10-CM

## 2024-05-14 DIAGNOSIS — Z85118 Personal history of other malignant neoplasm of bronchus and lung: Secondary | ICD-10-CM | POA: Diagnosis not present

## 2024-05-14 DIAGNOSIS — Z79631 Long term (current) use of antimetabolite agent: Secondary | ICD-10-CM | POA: Diagnosis not present

## 2024-05-14 DIAGNOSIS — Z9221 Personal history of antineoplastic chemotherapy: Secondary | ICD-10-CM | POA: Diagnosis not present

## 2024-05-14 DIAGNOSIS — Z860101 Personal history of adenomatous and serrated colon polyps: Secondary | ICD-10-CM | POA: Insufficient documentation

## 2024-05-14 DIAGNOSIS — Z86000 Personal history of in-situ neoplasm of breast: Secondary | ICD-10-CM | POA: Insufficient documentation

## 2024-05-14 DIAGNOSIS — Z7951 Long term (current) use of inhaled steroids: Secondary | ICD-10-CM | POA: Insufficient documentation

## 2024-05-14 DIAGNOSIS — Z801 Family history of malignant neoplasm of trachea, bronchus and lung: Secondary | ICD-10-CM | POA: Diagnosis not present

## 2024-05-14 MED ORDER — FLUTICASONE-SALMETEROL 250-50 MCG/ACT IN AEPB: 1.0000 | INHALATION_SPRAY | Freq: Two times a day (BID) | RESPIRATORY_TRACT | 11 refills | Status: DC

## 2024-05-14 MED ORDER — ALBUTEROL SULFATE HFA 108 (90 BASE) MCG/ACT IN AERS: 1.0000 | INHALATION_SPRAY | Freq: Four times a day (QID) | RESPIRATORY_TRACT | 3 refills | Status: AC | PRN

## 2024-05-14 NOTE — Progress Notes (Unsigned)
 Patient would like to ask about her CT scan on 05/07/2024, she said there was something about her liver.

## 2024-05-14 NOTE — Progress Notes (Unsigned)
 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  INTERVAL HISTORY: Patient returns to clinic today for routine evaluation and discussion of her imaging results.  She currently feels well and is asymptomatic.  She has no neurologic complaints.  She denies any recent fevers or illnesses. She has a good appetite and denies weight loss.  She denies any chest pain, shortness of breath, cough, or hemoptysis.  She denies any nausea, vomiting, constipation, or diarrhea.  She has no urinary complaints.  Patient offers no specific complaints today.    REVIEW OF SYSTEMS:   Review of Systems  Constitutional: Negative.  Negative for fever, malaise/fatigue and weight loss.  Respiratory: Negative.  Negative for cough, hemoptysis and shortness of breath.   Cardiovascular: Negative.  Negative for chest pain and leg swelling.  Gastrointestinal: Negative.  Negative for abdominal pain and heartburn.  Genitourinary:  Negative for dysuria and frequency.  Musculoskeletal: Negative.  Negative for back pain and myalgias.  Skin: Negative.  Negative for rash.  Neurological: Negative.  Negative for dizziness, sensory change, focal weakness and weakness.  Psychiatric/Behavioral: Negative.  The patient is not nervous/anxious.     As per HPI. Otherwise, a complete review of systems is negative.  PAST MEDICAL HISTORY: Past Medical History:  Diagnosis Date  . Adenoma of right adrenal gland 12/07/2021   a.) CT chest 12/07/2021; measured 2.3 cm;  not FDG avid on 01/06/2022 PET CT.  SABRA Anxiety    a.) uses BZO (alprazolam ) PRN  . Aortic atherosclerosis (HCC)   . Arthritis   . Bilateral carotid artery stenosis   . Bladder mass 12/07/2021   a.) CT abd 12/07/2021 --> 9 x 8 mm nodular enhancing focus at the base of the bladder; suspicious for urothelial neoplasm; b.) s/p TURBT 05/02/2022 --> pathology (-) for high grade dysplasia/malignancy  . Cervical spondylosis without myelopathy    a.) s/p ACDF C5-C7  . COPD (chronic obstructive pulmonary disease) (HCC)   . Coronary artery disease 10/16/2009   a.) LHC/PCI 10/16/2009: EF 60%; 50% OM1, 99% dRCA (3.0 x 18 mm Xience V DES). b.) LHC 07/31/2014: EF 65%; 40% pLAD, 20% pLCx, 40% OM1, 30% oRCA, 50% pRCA, 20% mRCA, 10% ISR stent to dRCA; further intervention deferred opting for med. mgmt.  . Depression   . Difficult intubation 03/02/2022   a.) anterior larynx; b.) limited oral opening  . DOE (dyspnea on exertion)   . 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 b.) s/p lumpectomy, adjuvant XRT; currently on extended AI (anastrozole ) therapy  . Essential hypertension   . GERD (gastroesophageal reflux disease)   . Hepatic steatosis   . History of 2019 novel coronavirus disease (COVID-19) 04/2023  . HLD (hyperlipidemia)   . Left thyroid  nodule 12/07/2021   a.) CT chest 12/07/2021: 2.6 cm; b.) CT chest 06/05/2023: hypoattenuating --> 2.5 cm  . Long term current use of immunosuppressive drug    a.) MTX for RA.  . OSA on CPAP    currently not using  . Osteopenia   .  Personal history of radiation therapy   . Pneumonia   . PRES (posterior reversible encephalopathy syndrome) 07/14/2014  . Pulmonary nodules 11/08/2021   a.) CXR 11/08/2021: 18mm nodular density RUL. b.) CT chest 12/07/2021: 1.9 x 1.4 x 2.5 spiculated lesion anterior RUL. c.) PET CT 01/06/2022: 2.2 x 1.9 spiculated anterior RUL mass (SUV max 16.4); d.) Bx (+) NSCLC, favoring SCC  with tumor necrosis; p40/CK7/GATA3 (+), TTF-1 (-)  . Rheumatoid arthritis (HCC)    a.) on MTX  . Seasonal allergies   . Seizures (HCC) 2014   a.) x 1 episode; etiology never determined.  . Sinus headache   . Squamous cell lung cancer, right (HCC) 03/17/2022   a.) RUL lobectomy 03/17/2022 --> Bx (+) for invasive moderately differentiated SCC of the RIGHT upper lobe (PDL-1: 5%) --> stage IIIA SCC (pT3, pN1, cM0)  . ST elevation myocardial infarction (STEMI) of inferior wall (HCC) 10/16/2009   a.) LHC/PCI 10/16/2009: EF 60%; 50% OM1, 99% dRCA (3.0 x 18 mm Xience V DES).  . Tubular adenoma     PAST SURGICAL HISTORY: Past Surgical History:  Procedure Laterality Date  . ABDOMINAL HYSTERECTOMY    . ANTERIOR CERVICAL DECOMP/DISCECTOMY FUSION N/A 01/20/2014   Procedure: ANTERIOR CERVICAL DECOMPRESSION/DISCECTOMY FUSION 2 LEVELS cervical five/six six roxan;  Surgeon: Reyes JONETTA Budge, MD;  Location: [facility];  Service: Neurosurgery;  Laterality: N/A;  . APPENDECTOMY    . BACK SURGERY     lumbar  . BLADDER INSTILLATION N/A 05/02/2022   Procedure: BLADDER INSTILLATION OF GEMCITABINE ;  Surgeon: Penne Knee, MD;  Location: [facility];  Service: Urology;  Laterality: N/A;  . 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 BREAST BIOPSY  01/16/2019   coil, BENIGN MAMMARY TISSUE WITH MODERATE STROMAL FIBROSIS AND COARSE DYSTROPHIC CALCIFICATIONS of the RIGHT breast  . BREAST LUMPECTOMY Right 03/07/2018   Procedure: BREAST LUMPECTOMY/ RE EXCISION;  Surgeon: Rodolph Romano, MD;  Location: [facility];  Service: General;  Laterality: Right;  . CARDIAC CATHETERIZATION Left 07/31/2014   Procedure: CARDIAC CATHETERIZATION; Location: [facility]; Surgeon: Wolm Rhyme, MD  . COLONOSCOPY WITH PROPOFOL  N/A 02/11/2021   Procedure: COLONOSCOPY WITH PROPOFOL ;  Surgeon: Jinny Carmine, MD;  Location: [facility];  Service: Endoscopy;  Laterality: N/A;  Requests Early  . CORONARY ANGIOPLASTY WITH  STENT PLACEMENT Left 10/16/2009   Procedure: CORONARY ANGIOPLASTY WITH STENT PLACEMENT; Location: [facility]; Surgeon: Margie Lovelace, MD  . CYSTOSCOPY W/ RETROGRADES Bilateral 05/02/2022   Procedure: CYSTOSCOPY WITH RETROGRADE PYELOGRAM;  Surgeon: Penne Knee, MD;  Location: [facility];  Service: Urology;  Laterality: Bilateral;  . ENDOBRONCHIAL ULTRASOUND Right 07/07/2023   Procedure: ENDOBRONCHIAL ULTRASOUND;  Surgeon: Tamea Dedra CROME, MD;  Location: [facility];  Service: Pulmonary;  Laterality: Right;  . ESOPHAGOGASTRODUODENOSCOPY (EGD) WITH PROPOFOL  N/A 10/07/2022   Procedure: ESOPHAGOGASTRODUODENOSCOPY (EGD) WITH PROPOFOL ;  Surgeon: Jinny Carmine, MD;  Location: [facility];  Service: Endoscopy;  Laterality: N/A;  sleep apnea  . FLEXIBLE BRONCHOSCOPY Right 07/07/2023   Procedure: FLEXIBLE BRONCHOSCOPY;  Surgeon: Tamea Dedra CROME, MD;  Location: [facility];  Service: Pulmonary;  Laterality: Right;  . INTERCOSTAL NERVE BLOCK Right 03/17/2022   Procedure: INTERCOSTAL NERVE BLOCK;  Surgeon: Kerrin Elspeth BROCKS, MD;  Location: [facility] OR;  Service: Thoracic;  Laterality: Right;  . LASIK Bilateral   . LUNG LOBECTOMY (RIGHT UPPER LOBE) Right 03/17/2022  . LYMPH NODE DISSECTION Right 03/17/2022   Procedure: LYMPH NODE DISSECTION;  Surgeon: Kerrin Elspeth BROCKS, MD;  Location: [facility] OR;  Service: Thoracic;  Laterality: Right;  . PARTIAL MASTECTOMY WITH NEEDLE LOCALIZATION Right 02/21/2018   Procedure: PARTIAL MASTECTOMY WITH NEEDLE LOCALIZATION;  Surgeon: Rodolph Romano, MD;  Location: [facility];  Service: General;  Laterality: Right;  . POLYPECTOMY N/A 02/11/2021   Procedure: POLYPECTOMY;  Surgeon: Jinny Carmine, MD;  Location: [facility];  Service: Endoscopy;  Laterality: N/A;  . ROTATOR CUFF REPAIR Left   . SHOULDER ARTHROSCOPY WITH ROTATOR CUFF REPAIR AND OPEN BICEPS TENODESIS Right 08/07/2019   Procedure: SHOULDER ARTHROSCOPY WITH DEBRIDEMENT, DECOMPRESSION, ROTATOR CUFF REPAIR  AND BICEPS TENODESIS. - RNFA;  Surgeon: Edie Norleen PARAS, MD;  Location: [facility];  Service: Orthopedics;  Laterality: Right;  . TOE SURGERY Right    has pin and plate in it  . TRANSURETHRAL RESECTION OF BLADDER TUMOR N/A 05/02/2022   Procedure: TRANSURETHRAL RESECTION OF BLADDER TUMOR (TURBT);  Surgeon: Penne Knee, MD;  Location: [facility];  Service: Urology;  Laterality: N/A;  . VIDEO BRONCHOSCOPY WITH ENDOBRONCHIAL ULTRASOUND N/A 03/02/2022   Procedure: VIDEO BRONCHOSCOPY WITH ENDOBRONCHIAL ULTRASOUND;  Surgeon: Tamea Dedra CROME, MD;  Location: [facility];  Service: Cardiopulmonary;  Laterality: N/A;    FAMILY HISTORY: Family History  Problem Relation Age of Onset  . Breast cancer Cousin 55       bilateral at 13 and 19; daughter of maternal aunt who was unaffected  . Lung cancer Maternal Aunt        dx 21s; deceased 38s; smoker  . Heart attack Father        deceased 43  . Stomach cancer Maternal Grandmother     ADVANCED DIRECTIVES (Y/N):  N  HEALTH MAINTENANCE: Social History   Tobacco Use  . Smoking status: Former    Current packs/day: 0.00    Average packs/day: 1 pack/day for 30.0 years (30.0 ttl pk-yrs)    Types: Cigarettes    Start date: 09/12/1985    Quit date: 09/13/2015    Years since quitting: 8.6  . Smokeless tobacco: Never  Vaping Use  . Vaping status: Never Used  Substance Use Topics  . Alcohol use: Yes    Comment: occasional  . Drug use: No     Colonoscopy:  PAP:  Bone density:  Lipid panel:  Allergies  Allergen Reactions  . Other     BANDAIDS-OF LEFT ON FOR AN EXTENDED PERIOD OF TIME  Patient states latex rubber gloves are ok    Current Outpatient Medications  Medication Sig Dispense Refill  . albuterol  (PROVENTIL ) (2.5 MG/3ML) 0.083% nebulizer solution Take 3 mLs (2.5 mg total) by nebulization every 6 (six) hours as needed for wheezing or shortness of breath. 125 mL 2  . albuterol  (VENTOLIN  HFA) 108 (90 Base) MCG/ACT inhaler Inhale 1-2 puffs  into the lungs every 6 (six) hours as needed for wheezing or shortness of breath. 1 g 3  . alendronate  (FOSAMAX ) 70 MG tablet TAKE ONE (1) TABLET BY MOUTH ONCE A WEEK. TAKE WITH A FULL GLASS OF WATER  ON AN EMPTY STOMACH. STAND OR SIT UPRIGHT FOR ONE (1) HOUR AFTER TAKING 12 tablet 1  . ALPRAZolam  (XANAX ) 0.25 MG tablet TAKE ONE (1) TO TWO (2) TABLETS BY MOUTH EVERY NIGHT 60 tablet 2  . aspirin-acetaminophen -caffeine (EXCEDRIN MIGRAINE) 250-250-65 MG tablet Take by mouth every 6 (six) hours as needed for headache.    . cyclobenzaprine  (FLEXERIL ) 10 MG tablet TAKE ONE (1) TABLET (10 MG TOTAL) BY MOUTH AT BEDTIME AS NEEDED FOR MUSCLE SPASMS. 30 tablet 1  . fluticasone  (FLONASE ) 50  MCG/ACT nasal spray Place 2 sprays into both nostrils daily as needed for allergies. 60 mL 1  . fluticasone -salmeterol (ADVAIR  DISKUS) 250-50 MCG/ACT AEPB Inhale 1 puff into the lungs in the morning and at bedtime. 60 each 11  . folic acid  (FOLVITE ) 1 MG tablet Take 2 mg by mouth in the morning.    . loratadine  (CLARITIN ) 10 MG tablet Take 10 mg by mouth daily.    . methotrexate  (RHEUMATREX) 2.5 MG tablet Take 20 mg by mouth once a week. WEDNESDAY    . montelukast  (SINGULAIR ) 10 MG tablet TAKE (1) TABLET BY MOUTH AT BEDTIME 90 tablet 1  . omeprazole  (PRILOSEC) 40 MG capsule TAKE (1) CAPSULE BY MOUTH EVERY DAY 90 capsule 3  . potassium chloride  (KLOR-CON ) 10 MEQ tablet TAKE (1) TABLET BY MOUTH EVERY DAY 90 tablet 1  . rosuvastatin  (CRESTOR ) 40 MG tablet Take 40 mg by mouth every evening.    . sertraline  (ZOLOFT ) 100 MG tablet TAKE (1) TABLET BY MOUTH EVERY DAY 90 tablet 1  . traMADol  (ULTRAM ) 50 MG tablet Take 1-2 tablets (50-100 mg total) by mouth 3 (three) times daily as needed. 60 tablet 0  . predniSONE  (DELTASONE ) 10 MG tablet Take 1 tab po 3 x day for 3 days then take 1 tab po 2 x a day for 3 days and then take 1 tab po daily for 3 days (Patient not taking: Reported on 05/14/2024) 18 tablet 0   No current  facility-administered medications for this visit.    OBJECTIVE: Vitals:   05/14/24 1058  BP: 128/82  Pulse: 84  Resp: 18  Temp: 97.8 F (36.6 C)  SpO2: 99%      Body mass index is 26.76 kg/m.    ECOG FS:0 - Asymptomatic  General: Well-developed, well-nourished, no acute distress. Eyes: Pink conjunctiva, anicteric sclera. HEENT: Normocephalic, moist mucous membranes. Lungs: No audible wheezing or coughing. Heart: Regular rate and rhythm. Abdomen: Soft, nontender, no obvious distention. Musculoskeletal: No edema, cyanosis, or clubbing. Neuro: Alert, answering all questions appropriately. Cranial nerves grossly intact. Skin: No rashes or petechiae noted. Psych: Normal affect.  LAB RESULTS:  Lab Results  Component Value Date   NA 140 08/31/2023   K 4.8 08/31/2023   CL 103 08/31/2023   CO2 24 08/31/2023   GLUCOSE 89 08/31/2023   BUN 18 08/31/2023   CREATININE 0.72 08/31/2023   CALCIUM  8.5 (L) 08/31/2023   PROT 6.2 08/31/2023   ALBUMIN  4.1 08/31/2023   AST 16 08/31/2023   ALT 14 08/31/2023   ALKPHOS 98 08/31/2023   BILITOT <0.2 08/31/2023   GFRNONAA >60 06/22/2023   GFRAA >60 04/09/2020    Lab Results  Component Value Date   WBC 5.5 08/31/2023   NEUTROABS 3.4 08/31/2023   HGB 12.0 08/31/2023   HCT 38.6 08/31/2023   MCV 94 08/31/2023   PLT 203 08/31/2023     STUDIES: CT Chest W Contrast Result Date: 05/09/2024 CLINICAL DATA:  Squamous cell carcinoma of right lung * Tracking Code: BO * EXAM: CT CHEST WITH CONTRAST TECHNIQUE: Multidetector CT imaging of the chest was performed during intravenous contrast administration. RADIATION DOSE REDUCTION: This exam was performed according to the departmental dose-optimization program which includes automated exposure control, adjustment of the mA and/or kV according to patient size and/or use of iterative reconstruction technique. CONTRAST:  75mL OMNIPAQUE  IOHEXOL  300 MG/ML  SOLN COMPARISON:  10/24/2023 FINDINGS:  Cardiovascular: Aortic atherosclerosis. Normal heart size. Three-vessel coronary artery calcifications. No pericardial effusion. Mediastinum/Nodes: No enlarged  mediastinal, hilar, or axillary lymph nodes. Unchanged left lobe thyroid  nodule, previously assessed by dedicated thyroid  ultrasound and biopsy. Trachea, and esophagus demonstrate no significant findings. Lungs/Pleura: Unchanged postoperative appearance of the chest status post right upper lobectomy. Unchanged benign subpleural nodule in the dependent left upper lobe measuring 0.4 cm (series 4, image 41). No pleural effusion or pneumothorax. Upper Abdomen: No acute abnormality. Coarse contour of the liver. Unchanged, benign macroscopic fat containing right adrenal adenoma, requiring no further follow-up or characterization. Musculoskeletal: Right lumpectomy with associated seroma (series 2, image 73). No acute osseous findings. IMPRESSION: 1. Unchanged postoperative appearance of the chest status post right upper lobectomy. 2. No evidence of recurrent or metastatic disease in the chest. 3. Coarse contour of the liver, suggestive of cirrhosis. 4. Coronary artery disease. Aortic Atherosclerosis (ICD10-I70.0). Electronically Signed   By: Marolyn JONETTA Jaksch M.D.   On: 05/09/2024 17:01   MM 3D SCREENING MAMMOGRAM BILATERAL BREAST Result Date: 04/18/2024 CLINICAL DATA:  Screening. EXAM: DIGITAL SCREENING BILATERAL MAMMOGRAM WITH TOMOSYNTHESIS AND CAD TECHNIQUE: Bilateral screening digital craniocaudal and mediolateral oblique mammograms were obtained. Bilateral screening digital breast tomosynthesis was performed. The images were evaluated with computer-aided detection. COMPARISON:  Previous exam(s). ACR Breast Density Category d: The breasts are extremely dense, which lowers the sensitivity of mammography. FINDINGS: There are no findings suspicious for malignancy. IMPRESSION: No mammographic evidence of malignancy. A result letter of this screening mammogram will be  mailed directly to the patient. RECOMMENDATION: Screening mammogram in one year. (Code:[id number]) BI-RADS CATEGORY  1: Negative. Electronically Signed   By: Toribio Agreste M.D.   On: 04/18/2024 13:11   DG Bone Density Result Date: 04/17/2024 EXAM: DUAL X-RAY ABSORPTIOMETRY (DXA) FOR BONE MINERAL DENSITY 04/16/2024 11:26 am CLINICAL DATA:  68 year old Female Postmenopausal. Patient is or has been on bone building therapies. TECHNIQUE: An axial (e.g., hips, spine) and/or appendicular (e.g., radius) exam was performed, as appropriate, using GE Secretary/administrator at [facility]. Images are obtained for bone mineral density measurement and are not obtained for diagnostic purposes. [id number] Exclusions: Lumbar spine due to degenerative changes. COMPARISON:  03/21/2023. FINDINGS: Scan quality: Good. LEFT FEMORAL NECK: BMD (in g/cm2): 0.867 T-score: -1.2 Z-score: 0.3 LEFT TOTAL HIP: BMD (in g/cm2): 0.866 T-score: -1.1 Z-score: 0.2 RIGHT FEMORAL NECK: BMD (in g/cm2): 0.797 T-score: -1.7 Z-score: -0.2 RIGHT TOTAL HIP: BMD (in g/cm2): 0.844 T-score: -1.3 Z-score: 0.0 DUAL-FEMUR TOTAL MEAN: Rate of change from previous exam: No significant rate of change from previous exam. RIGHT FOREARM (RADIUS 33%): BMD (in g/cm2): 0.733 T-score: -1.6 Z-score: -0.1 FRAX 10-YEAR PROBABILITY OF FRACTURE: FRAX not reported as the patient is receiving bone building therapy. IMPRESSION: Osteopenia based on BMD. Fracture risk is unknown due to history of bone building therapy. RECOMMENDATIONS: 1. All patients should optimize calcium  and vitamin D  intake. 2. Consider FDA-approved medical therapies in postmenopausal women and men aged 64 years and older, based on the following: - A hip or vertebral (clinical or morphometric) fracture - T-score less than or equal to -2.5 and secondary causes have been excluded. - Low bone mass (T-score between -1.0 and -2.5) and a 10-year probability of a hip fracture greater than or equal to  3% or a 10-year probability of a major osteoporosis-related fracture greater than or equal to 20% based on the [place] -adapted WHO algorithm. - Clinician judgment and/or patient preferences may indicate treatment for people with 10-year fracture probabilities above or below these levels 3. Patients with diagnosis of osteoporosis or at high  risk for fracture should have regular bone mineral density tests. For patients eligible for Medicare, routine testing is allowed once every 2 years. The testing frequency can be increased to one year for patients who have rapidly progressing disease, those who are receiving or discontinuing medical therapy to restore bone mass, or have additional risk factors. Electronically Signed   By: Reyes Phi M.D.   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  PLAN:    Stage IIIa squamous cell carcinoma of the right upper lung: Final pathology results from her right upper lobectomy on March 17, 2022 revealed a 4.1 cm lesion that was adherent to the chest wall, but not invading.  She was also noted to have 1 of 33 lymph nodes positive for disease.  This increased her to a stage IIIa. MRI of the brain on April 21, 2022 reviewed independently with no obvious evidence of metastatic disease. Patient completed 4 cycles of adjuvant chemotherapy using cisplatin  and Taxotere  on June 22, 2022.  Her most recent imaging with CT scan on 01/21/2024 reviewed independently and reported as above with no obvious evidence of recurrent or progressive disease.  Mucous plug seen previously has resolved.  No further intervention is needed.  Return to clinic in 6 months with repeat CT scan and further evaluation.       DCIS, right breast: Patient underwent lumpectomy followed by adjuvant XRT completing in September 2019.  Because there was no invasive component on her pathology, she did not require adjuvant  chemotherapy. Patient could not tolerate tamoxifen  or letrozole  secondary to worsening joint pain, therefore was switched to anastrozole  which she completed in September 2024. Her most recent mammogram on March 21, 2023 was reported as BI-RADS 1.  Repeat mammogram in July 2025.   Osteopenia: Patient's most recent bone mineral density on March 21, 2023 reported T-score of -2.5 which continues to trend down over the past several years.  Continue alendronate , but consider switching to Prolia in the near future.  Patient has also been instructed to continue calcium  and vitamin D  supplementation.  Repeat bone mineral density in July 2025 along with mammogram as above.   Thyroid  nodule: Will get ultrasound in the next 1 to 2 weeks.   Reflux/abdominal pain: Patient does not complain of this today.  Continue omeprazole .    Evalene JINNY Reusing, MD 05/14/2024 11:50 AM   Cancer Staging  Ductal carcinoma in situ (DCIS) of right breast Staging form: Breast, AJCC 8th Edition - Clinical: Stage 0 (cTis (DCIS), cN0, cM0, ER+, PR-, HER2-) - Signed by Reusing Evalene JINNY, MD on 03/18/2018 Nuclear grade: G3 Laterality: Right  Squamous cell carcinoma of right lung (HCC) Staging form: Lung, AJCC 8th Edition - Pathologic stage from 04/07/2022: Stage IIIA (pT3, pN1, cM0) - Signed by Reusing Evalene JINNY, MD on 04/07/2022

## 2024-05-14 NOTE — Progress Notes (Signed)
 Subjective:    Patient ID: Michelle Moore, female    DOB: Apr 06, 1956, 68 y.o.   MRN: [id number]  Patient Care Team: Kristina Tinnie MARLA DEVONNA as PCP - General (Physician Assistant) Verdene Gills, RN as Oncology Nurse Navigator Jacobo, Evalene PARAS, MD as Consulting Physician (Oncology) Tamea Dedra CROME, MD as Consulting Physician (Pulmonary Disease)  Chief Complaint  Patient presents with   COPD    Shortness of breath on exertion. Nasal and chest congestion.     BACKGROUND/INTERVAL:Tiwatope is a 68 year old former smoker with a 30-pack-year history of smoking who presents for follow-up on the issue of COPD and shortness of breath.  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 Kerrin on 17 March 2022.  She had 1 out of 18 nodes positive for carcinoma.  Staging was modified to 3A.  Surveillance CT of 05 June 2023 showed bronchus with increasing volume loss in the right middle lobe she underwent bronchoscopy 07 July 2023 that showed distortion of the right mainstem bronchus postsurgical resection and mucus impaction/plugging of the right middle lobe.  She was last seen on 06 December 2023. CT chest of 24 October 2023 showed no evidence of recurrent or metastatic disease and interval clearing of the previously seen right middle lobe mucous plug.  Most recent CT scan of the chest was performed on 07 May 2024, no recurrence noted.    HPI Discussed the use of AI scribe software for clinical note transcription with the patient, who gave verbal consent to proceed.  History of Present Illness   Michelle Moore is a 68 year old female with lung cancer and COPD who presents with persistent morning headaches and fatigue.  She experiences headaches every morning upon waking, often around 4:30 AM, accompanied by facial pain and hoarseness. She describes a persistent lack of energy, stating she never feels good.  She denies issues  with sleep duration, sleeping through the night but often returning to sleep on the couch for an additional hour after waking. Her husband suggests she may snore, though she has not been formally diagnosed with sleep apnea.  She did use her CPAP previously she underwent a home sleep study in 2024 that showed mild sleep apnea with AHI of 5 and she opted to treat conservatively.  Her blood pressure readings have been inconsistent, with one arm showing 180 and the other 130, though typical readings are around 128/78.  She mentions poor dietary habits, often not eating during the day and only eating at night. She has a history of using a CPAP machine, though it has not been adjusted in years.     She does not endorse any other symptomatology today.  She has had no cough or sputum production.  No hemoptysis.  No shortness of breath per se.  No chest pain.  She needs refills on her Advair  maintenance and albuterol  rescue inhalers.  Patient had PFTs on 29 April 2024.  FEV1 was 1.27 L or 63% predicted, FVC was 1.81 L or 69% predicted, FEV1/FVC is 70%, lung volumes were normal.  Diffusion capacity moderately reduced.  Flow-volume loop consistent with obstruction.  Consistent with moderate obstructive airways disease and moderate diffusion defect.  Review of Systems A 10 point review of systems was performed and it is as noted above otherwise negative.   Patient Active Problem List   Diagnosis Date Noted   Atelectasis, right 07/07/2023   Dysphagia 10/07/2022  Abdominal pain, epigastric 10/07/2022   S/P lobectomy of lung 03/17/2022   S/P robot-assisted surgical procedure 03/17/2022   Squamous cell carcinoma of right lung (HCC) 02/15/2022   S/P cardiac catheterization 04/06/2021   Osteoporosis without current pathological fracture 04/06/2021   Aromatase inhibitor use 04/06/2021   Special screening for malignant neoplasms, colon    Family history of colon cancer    Polyp of descending colon     Cough 08/28/2020   Hypokalemia 07/25/2020   Urinary tract infection without hematuria 07/25/2020   Recurrent displacement of lumbar disc 04/08/2020   Essential hypertension 01/22/2020   Degenerative tear of glenoid labrum of right shoulder 07/19/2019   Nontraumatic complete tear of right rotator cuff 07/19/2019   Rotator cuff tendinitis, right 07/19/2019   Tendinitis of upper biceps tendon of right shoulder 07/19/2019   Encounter for general adult medical examination with abnormal findings 05/29/2019   Dysuria 05/29/2019   Acute non-recurrent pansinusitis 06/01/2018   Perennial allergic rhinitis 06/01/2018   Generalized anxiety disorder 06/01/2018   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   Coronary artery disease 11/09/2017   Mixed hyperlipidemia 11/09/2017   Bilateral carotid artery stenosis 08/14/2014   Gastroesophageal reflux disease with esophagitis 08/14/2014   OSA on CPAP 07/14/2014   Weight loss of more than 10% body weight 07/14/2014   Cervical spondylosis with radiculopathy 01/20/2014   Cervical spondylosis without myelopathy 01/20/2014    Social History   Tobacco Use   Smoking status: Former    Current packs/day: 0.00    Average packs/day: 1 pack/day for 30.0 years (30.0 ttl pk-yrs)    Types: Cigarettes    Start date: 09/12/1985    Quit date: 09/13/2015    Years since quitting: 8.6   Smokeless tobacco: Never  Substance Use Topics   Alcohol use: Yes    Comment: occasional    Allergies  Allergen Reactions   Other     BANDAIDS-OF LEFT ON FOR AN EXTENDED PERIOD OF TIME  Patient states latex rubber gloves are ok    Current Meds  Medication Sig   albuterol  (PROVENTIL ) (2.5 MG/3ML) 0.083% nebulizer solution Take 3 mLs (2.5 mg total) by nebulization every 6 (six) hours as needed for wheezing or shortness of breath.   alendronate  (FOSAMAX ) 70 MG tablet TAKE ONE (1) TABLET BY MOUTH ONCE A WEEK. TAKE WITH A FULL GLASS OF WATER  ON AN EMPTY STOMACH.  STAND OR SIT UPRIGHT FOR ONE (1) HOUR AFTER TAKING   ALPRAZolam  (XANAX ) 0.25 MG tablet TAKE ONE (1) TO TWO (2) TABLETS BY MOUTH EVERY NIGHT   aspirin-acetaminophen -caffeine (EXCEDRIN MIGRAINE) 250-250-65 MG tablet Take by mouth every 6 (six) hours as needed for headache.   cyclobenzaprine  (FLEXERIL ) 10 MG tablet TAKE ONE (1) TABLET (10 MG TOTAL) BY MOUTH AT BEDTIME AS NEEDED FOR MUSCLE SPASMS.   fluticasone  (FLONASE ) 50 MCG/ACT nasal spray Place 2 sprays into both nostrils daily as needed for allergies.   fluticasone -salmeterol (ADVAIR  DISKUS) 250-50 MCG/ACT AEPB Inhale 1 puff into the lungs in the morning and at bedtime.   folic acid  (FOLVITE ) 1 MG tablet Take 2 mg by mouth in the morning.   loratadine  (CLARITIN ) 10 MG tablet Take 10 mg by mouth daily.   methotrexate  (RHEUMATREX) 2.5 MG tablet Take 20 mg by mouth once a week. WEDNESDAY   montelukast  (SINGULAIR ) 10 MG tablet TAKE (1) TABLET BY MOUTH AT BEDTIME   omeprazole  (PRILOSEC) 40 MG capsule TAKE (1) CAPSULE BY MOUTH EVERY DAY   potassium  chloride (KLOR-CON ) 10 MEQ tablet TAKE (1) TABLET BY MOUTH EVERY DAY   rosuvastatin  (CRESTOR ) 40 MG tablet Take 40 mg by mouth every evening.   sertraline  (ZOLOFT ) 100 MG tablet TAKE (1) TABLET BY MOUTH EVERY DAY   traMADol  (ULTRAM ) 50 MG tablet Take 1-2 tablets (50-100 mg total) by mouth 3 (three) times daily as needed.   [DISCONTINUED] albuterol  (VENTOLIN  HFA) 108 (90 Base) MCG/ACT inhaler Inhale 1-2 puffs into the lungs every 6 (six) hours as needed for wheezing or shortness of breath.   [DISCONTINUED] fluticasone -salmeterol (ADVAIR ) 250-50 MCG/ACT AEPB INHALE 1 PUFF TWICE DAILY IN THE MORNING AND AT BEDTIME    Immunization History  Administered Date(s) Administered   Fluad Trivalent(High Dose 65+) 06/22/2023   Influenza Inj Mdck Quad Pf 06/26/2017, 04/24/2020, 06/03/2021, 07/25/2022   Influenza,inj,Quad PF,6+ Mos 08/02/2018   Influenza,inj,quad, With Preservative 06/17/2019   Influenza-Unspecified  06/17/2019   Moderna SARS-COV2 Booster Vaccination 08/04/2020   Moderna Sars-Covid-2 Vaccination 11/13/2019, 12/11/2019   Pneumococcal Conjugate-13 06/17/2019   Pneumococcal Polysaccharide-23 06/17/2019        Objective:     BP (!) 130/100   Pulse 89   Temp 97.6 F (36.4 C) (Temporal)   Ht 5' (1.524 m)   Wt 137 lb 6.4 oz (62.3 kg)   SpO2 97%   BMI 26.83 kg/m   SpO2: 97 %  GENERAL: Well-developed, well-nourished woman, no acute distress. Fully ambulatory.  No conversational dyspnea. HEAD: Normocephalic, atraumatic.  EYES: Pupils equal, round, reactive to light.  No scleral icterus.  MOUTH: Oral mucosa moist.  No thrush. NECK: Supple.  Left thyroid  prominent. Trachea midline. No JVD.  No adenopathy. PULMONARY: Good air entry bilaterally.  No adventitious sounds. CARDIOVASCULAR: S1 and S2. Regular rate and rhythm.  No rubs, murmurs or gallops heard. ABDOMEN: Benign. MUSCULOSKELETAL: No joint deformity, no clubbing, no edema.  NEUROLOGIC: No overt focal deficit, no gait disturbance, speech is fluent. SKIN: Intact,warm,dry. PSYCH: Normal mood, normal behavior.      05/14/2024   10:18 AM 09/01/2022    1:00 PM  Results of the Epworth flowsheet  Sitting and reading 0 1  Watching TV 2 1  Sitting, inactive in a public place (e.g. a theatre or a meeting) 0 0  As a passenger in a car for an hour without a break 0 0  Lying down to rest in the afternoon when circumstances permit 3 3  Sitting and talking to someone 0 0  Sitting quietly after a lunch without alcohol 0 0  In a car, while stopped for a few minutes in traffic 0 0  Total score 5 5      Assessment & Plan:     ICD-10-CM   1. Stage 2 moderate COPD by GOLD classification (HCC)  J44.9     2. Sleep apnea in adult  G47.30 Home sleep test      Orders Placed This Encounter  Procedures   Home sleep test    Standing Status:   Future    Expected Date:   05/28/2024    Expiration Date:   05/14/2025    Where should this  test be performed::   [facility] - Pul[facility]    Meds ordered this encounter  Medications   albuterol  (VENTOLIN  HFA) 108 (90 Base) MCG/ACT inhaler    Sig: Inhale 1-2 puffs into the lungs every 6 (six) hours as needed for wheezing or shortness of breath.    Dispense:  1 g    Refill:  3  fluticasone -salmeterol (ADVAIR  DISKUS) 250-50 MCG/ACT AEPB    Sig: Inhale 1 puff into the lungs in the morning and at bedtime.    Dispense:  60 each    Refill:  11   Discussion:    Chronic obstructive pulmonary disease (COPD) status post right lung lobectomy for cancer COPD is well-managed with expected post-lobectomy changes. Recent imaging shows no cancer recurrence in the right lung. Pulmonary function tests are stable compared to previous assessments. - Renew albuterol  inhaler prescription. - Renew Advair  description  Obstructive sleep apnea, possible worsening Symptoms of morning headaches and fatigue suggest possible worsening of obstructive sleep apnea. CPAP machine has not been used or adjusted in years. - Order home sleep study to reassess sleep apnea severity.  Allergic rhinitis due to ragweed Symptoms include morning headaches, facial pain, and hoarseness, likely exacerbated by current ragweed season.  - Allergen avoidance  Fatty liver, suspected Liver changes on recent imaging suggest fatty liver. No immediate danger, but requires monitoring.      Advised if symptoms do not improve or worsen, to please contact office for sooner follow up or seek emergency care.    I spent 30 minutes of dedicated to the care of this patient on the date of this encounter to include pre-visit review of records, face-to-face time with the patient discussing conditions above, post visit ordering of testing, clinical documentation with the electronic health record, making appropriate referrals as documented, and communicating necessary findings to members of the patients care team.     C. Leita Sanders,  MD Advanced Bronchoscopy PCCM [facility]    *This note was generated using voice recognition software/Dragon and/or AI transcription program.  Despite best efforts to proofread, errors can occur which can change the meaning. Any transcriptional errors that result from this process are unintentional and may not be fully corrected at the time of dictation.

## 2024-05-14 NOTE — Patient Instructions (Signed)
 VISIT SUMMARY:  During today's visit, we discussed your persistent morning headaches, fatigue, and other symptoms. We reviewed your history of lung cancer, COPD, and possible sleep apnea. We also discussed your dietary habits and recent imaging results.  YOUR PLAN:  -CHRONIC OBSTRUCTIVE PULMONARY DISEASE (COPD): COPD is a chronic lung condition that makes it hard to breathe. Your COPD is well-managed, and recent imaging shows no recurrence of cancer in your right lung. We will continue with your current treatment and prescribe an albuterol  inhaler for relief.  -OBSTRUCTIVE SLEEP APNEA: Obstructive sleep apnea is a condition where your breathing stops and starts during sleep. Your symptoms suggest it may be worsening, so we will order a home sleep study to reassess its severity.  -ALLERGIC RHINITIS: Allergic rhinitis is an allergic reaction that causes sneezing, congestion, and other symptoms. Your morning headaches, facial pain, and hoarseness are likely due to allergies, especially during the ragweed season.  -FATTY LIVER: Fatty liver is a condition where fat builds up in the liver. Recent imaging suggests you may have this condition, but it is not an immediate danger. We will monitor it over time.  INSTRUCTIONS:  Please follow up with the home sleep study as ordered. Continue using your albuterol  inhaler as prescribed. Maintain a balanced diet and monitor your symptoms. Schedule a follow-up appointment to discuss the results of your sleep study and any other concerns.

## 2024-05-15 ENCOUNTER — Encounter: Payer: Self-pay | Admitting: Oncology

## 2024-05-15 ENCOUNTER — Other Ambulatory Visit: Payer: Self-pay

## 2024-05-15 ENCOUNTER — Other Ambulatory Visit: Payer: Self-pay | Admitting: Internal Medicine

## 2024-05-15 DIAGNOSIS — M9902 Segmental and somatic dysfunction of thoracic region: Secondary | ICD-10-CM | POA: Diagnosis not present

## 2024-05-15 DIAGNOSIS — M9905 Segmental and somatic dysfunction of pelvic region: Secondary | ICD-10-CM | POA: Diagnosis not present

## 2024-05-15 DIAGNOSIS — M5137 Other intervertebral disc degeneration, lumbosacral region with discogenic back pain only: Secondary | ICD-10-CM | POA: Diagnosis not present

## 2024-05-15 DIAGNOSIS — E876 Hypokalemia: Secondary | ICD-10-CM

## 2024-05-15 DIAGNOSIS — M9903 Segmental and somatic dysfunction of lumbar region: Secondary | ICD-10-CM | POA: Diagnosis not present

## 2024-05-15 DIAGNOSIS — M546 Pain in thoracic spine: Secondary | ICD-10-CM | POA: Diagnosis not present

## 2024-05-15 NOTE — Telephone Encounter (Signed)
 Ok to send

## 2024-05-22 DIAGNOSIS — M5137 Other intervertebral disc degeneration, lumbosacral region with discogenic back pain only: Secondary | ICD-10-CM | POA: Diagnosis not present

## 2024-05-22 DIAGNOSIS — M9903 Segmental and somatic dysfunction of lumbar region: Secondary | ICD-10-CM | POA: Diagnosis not present

## 2024-05-22 DIAGNOSIS — M546 Pain in thoracic spine: Secondary | ICD-10-CM | POA: Diagnosis not present

## 2024-05-22 DIAGNOSIS — M9902 Segmental and somatic dysfunction of thoracic region: Secondary | ICD-10-CM | POA: Diagnosis not present

## 2024-05-22 DIAGNOSIS — M9905 Segmental and somatic dysfunction of pelvic region: Secondary | ICD-10-CM | POA: Diagnosis not present

## 2024-05-23 ENCOUNTER — Other Ambulatory Visit: Payer: Self-pay | Admitting: Pulmonary Disease

## 2024-05-27 DIAGNOSIS — M0579 Rheumatoid arthritis with rheumatoid factor of multiple sites without organ or systems involvement: Secondary | ICD-10-CM | POA: Diagnosis not present

## 2024-05-27 DIAGNOSIS — Z79899 Other long term (current) drug therapy: Secondary | ICD-10-CM | POA: Diagnosis not present

## 2024-05-28 ENCOUNTER — Encounter

## 2024-05-28 DIAGNOSIS — G4730 Sleep apnea, unspecified: Secondary | ICD-10-CM | POA: Diagnosis not present

## 2024-06-04 DIAGNOSIS — M5412 Radiculopathy, cervical region: Secondary | ICD-10-CM | POA: Diagnosis not present

## 2024-06-11 DIAGNOSIS — G4733 Obstructive sleep apnea (adult) (pediatric): Secondary | ICD-10-CM | POA: Diagnosis not present

## 2024-06-12 ENCOUNTER — Ambulatory Visit: Payer: Self-pay | Admitting: Pulmonary Disease

## 2024-06-12 DIAGNOSIS — G4733 Obstructive sleep apnea (adult) (pediatric): Secondary | ICD-10-CM

## 2024-06-13 NOTE — Telephone Encounter (Signed)
-----   Message from Dedra Sanders sent at 06/12/2024 10:55 AM EDT ----- Home sleep study shows moderate sleep apnea.  She would benefit from CPAP therapy.  We can order if patient is interested in pursuing.  This may be the reason for her lack of energy and fatigue. ----- Message ----- From: Vannie Donzell RAMAN Sent: 06/12/2024   7:44 AM EDT To: Dedra LITTIE Sanders, MD

## 2024-06-13 NOTE — Progress Notes (Signed)
 Auto CPAP 5 to 15 cm H2O, heated humidity, EPR 3, mask of choice.  See if [employer] can provide.

## 2024-06-13 NOTE — Telephone Encounter (Signed)
 I have notified the patient. She would like for us  to order the CPAP and she will follow up 31-90 days after she received the machine.   Dr. Tamea- what are the settings?

## 2024-06-13 NOTE — Telephone Encounter (Signed)
Order has been placed. Nothing further needed. 

## 2024-07-02 DIAGNOSIS — E785 Hyperlipidemia, unspecified: Secondary | ICD-10-CM | POA: Diagnosis not present

## 2024-07-02 DIAGNOSIS — I2510 Atherosclerotic heart disease of native coronary artery without angina pectoris: Secondary | ICD-10-CM | POA: Diagnosis not present

## 2024-07-02 DIAGNOSIS — J449 Chronic obstructive pulmonary disease, unspecified: Secondary | ICD-10-CM | POA: Diagnosis not present

## 2024-07-22 ENCOUNTER — Other Ambulatory Visit: Payer: Self-pay | Admitting: Oncology

## 2024-07-25 ENCOUNTER — Encounter: Payer: Self-pay | Admitting: Pulmonary Disease

## 2024-07-25 ENCOUNTER — Ambulatory Visit: Admitting: Pulmonary Disease

## 2024-07-25 VITALS — BP 126/82 | HR 96 | Temp 97.9°F | Ht 60.0 in | Wt 135.0 lb

## 2024-07-25 DIAGNOSIS — G4733 Obstructive sleep apnea (adult) (pediatric): Secondary | ICD-10-CM | POA: Diagnosis not present

## 2024-07-25 DIAGNOSIS — Z23 Encounter for immunization: Secondary | ICD-10-CM | POA: Diagnosis not present

## 2024-07-25 DIAGNOSIS — C3491 Malignant neoplasm of unspecified part of right bronchus or lung: Secondary | ICD-10-CM

## 2024-07-25 DIAGNOSIS — J449 Chronic obstructive pulmonary disease, unspecified: Secondary | ICD-10-CM | POA: Diagnosis not present

## 2024-07-25 NOTE — Progress Notes (Unsigned)
 Subjective:    Patient ID: Michelle Moore, female    DOB: [birth year], 68 y.o.   MRN: [id number]  Patient Care Team: Kristina Tinnie MARLA DEVONNA as PCP - General (Physician Assistant) Verdene Gills, RN as Oncology Nurse Navigator Jacobo, Evalene PARAS, MD as Consulting Physician (Oncology) Tamea Dedra CROME, MD as Consulting Physician (Pulmonary Disease)  Chief Complaint  Patient presents with  . COPD    Shortness of breath on exertion.     BACKGROUND/INTERVAL:  HPI   Review of Systems A 10 point review of systems was performed and it is as noted above otherwise negative.   Patient Active Problem List   Diagnosis Date Noted  . Atelectasis, right 07/07/2023  . Dysphagia 10/07/2022  . Abdominal pain, epigastric 10/07/2022  . S/P lobectomy of lung 03/17/2022  . S/P robot-assisted surgical procedure 03/17/2022  . Squamous cell carcinoma of right lung (HCC) 02/15/2022  . S/P cardiac catheterization 04/06/2021  . Osteoporosis without current pathological fracture 04/06/2021  . Aromatase inhibitor use 04/06/2021  . Special screening for malignant neoplasms, colon   . Family history of colon cancer   . Polyp of descending colon   . Cough 08/28/2020  . Hypokalemia 07/25/2020  . Urinary tract infection without hematuria 07/25/2020  . Recurrent displacement of lumbar disc 04/08/2020  . Essential hypertension 01/22/2020  . Degenerative tear of glenoid labrum of right shoulder 07/19/2019  . Nontraumatic complete tear of right rotator cuff 07/19/2019  . Rotator cuff tendinitis, right 07/19/2019  . Tendinitis of upper biceps tendon of right shoulder 07/19/2019  . Encounter for general adult medical examination with abnormal findings 05/29/2019  . Dysuria 05/29/2019  . Acute non-recurrent pansinusitis 06/01/2018  . Perennial allergic rhinitis 06/01/2018  . Generalized anxiety disorder 06/01/2018  . 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 Coronary artery disease  11/09/2017  . Mixed hyperlipidemia 11/09/2017  . Bilateral carotid artery stenosis 08/14/2014  . Gastroesophageal reflux disease with esophagitis 08/14/2014  . OSA on CPAP 07/14/2014  . Weight loss of more than 10% body weight 07/14/2014  . Cervical spondylosis with radiculopathy 01/20/2014  . Cervical spondylosis without myelopathy 01/20/2014    Social History   Tobacco Use  . Smoking status: Former    Current packs/day: 0.00    Average packs/day: 1 pack/day for 30.0 years (30.0 ttl pk-yrs)    Types: Cigarettes    Start date: 09/12/1985    Quit date: 09/13/2015    Years since quitting: 8.8  . Smokeless tobacco: Never  Substance Use Topics  . Alcohol use: Yes    Comment: occasional    Allergies  Allergen Reactions  . Other     BANDAIDS-OF LEFT ON FOR AN EXTENDED PERIOD OF TIME  Patient states latex rubber gloves are ok    Current Meds  Medication Sig  . albuterol  (PROVENTIL ) (2.5 MG/3ML) 0.083% nebulizer solution Take 3 mLs (2.5 mg total) by nebulization every 6 (six) hours as needed for wheezing or shortness of breath.  . albuterol  (VENTOLIN  HFA) 108 (90 Base) MCG/ACT inhaler Inhale 1-2 puffs into the lungs every 6 (six) hours as needed for wheezing or shortness of breath.  . alendronate  (FOSAMAX ) 70 MG tablet TAKE ONE (1) TABLET BY MOUTH ONCE A WEEK. TAKE WITH A FULL GLASS OF WATER  ON AN EMPTY STOMACH. STAND OR SIT UPRIGHT FOR ONE (1) HOUR AFTER TAKING.  . ALPRAZolam  (XANAX ) 0.25 MG tablet TAKE ONE (1) TO TWO (2) TABLETS BY MOUTH EVERY NIGHT  .  aspirin-acetaminophen -caffeine (EXCEDRIN MIGRAINE) 250-250-65 MG tablet Take by mouth every 6 (six) hours as needed for headache.  . cyclobenzaprine  (FLEXERIL ) 10 MG tablet TAKE ONE (1) TABLET (10 MG TOTAL) BY MOUTH AT BEDTIME AS NEEDED FOR MUSCLE SPASMS.  . fluticasone  (FLONASE ) 50 MCG/ACT nasal spray Place 2 sprays into both nostrils daily as needed for allergies.  . fluticasone -salmeterol (ADVAIR ) 250-50 MCG/ACT AEPB INHALE 1 PUFF  TWICE DAILY IN THE MORNING AND AT BEDTIME  . folic acid  (FOLVITE ) 1 MG tablet Take 2 mg by mouth in the morning.  . gabapentin  (NEURONTIN ) 100 MG capsule Take 100 mg by mouth 3 (three) times daily.  . loratadine  (CLARITIN ) 10 MG tablet Take 10 mg by mouth daily.  . methotrexate  (RHEUMATREX) 2.5 MG tablet Take 20 mg by mouth once a week. WEDNESDAY  . montelukast  (SINGULAIR ) 10 MG tablet TAKE (1) TABLET BY MOUTH AT BEDTIME  . omeprazole  (PRILOSEC) 40 MG capsule TAKE (1) CAPSULE BY MOUTH EVERY DAY  . potassium chloride  (KLOR-CON ) 10 MEQ tablet TAKE (1) TABLET BY MOUTH EVERY DAY  . rosuvastatin  (CRESTOR ) 40 MG tablet Take 40 mg by mouth every evening.  . sertraline  (ZOLOFT ) 100 MG tablet TAKE (1) TABLET BY MOUTH EVERY DAY  . traMADol  (ULTRAM ) 50 MG tablet Take 1-2 tablets (50-100 mg total) by mouth 3 (three) times daily as needed.    Immunization History  Administered Date(s) Administered  . Fluad Trivalent(High Dose 65+) 06/22/2023  . Influenza Inj Mdck Quad Pf 06/26/2017, 04/24/2020, 06/03/2021, 07/25/2022  . Influenza,inj,Quad PF,6+ Mos 08/02/2018  . Influenza,inj,quad, With Preservative 06/17/2019  . Influenza-Unspecified 06/17/2019  . Moderna SARS-COV2 Booster Vaccination 08/04/2020  . Moderna Sars-Covid-2 Vaccination 11/13/2019, 12/11/2019  . Pneumococcal Conjugate-13 06/17/2019  . Pneumococcal Polysaccharide-23 06/17/2019        Objective:     BP 126/82   Pulse 96   Temp 97.9 F (36.6 C) (Temporal)   Ht 5' (1.524 m)   Wt 135 lb (61.2 kg)   SpO2 96%   BMI 26.37 kg/m   SpO2: 96 %  GENERAL: HEAD: Normocephalic, atraumatic.  EYES: Pupils equal, round, reactive to light.  No scleral icterus.  MOUTH:  NECK: Supple. No thyromegaly. Trachea midline. No JVD.  No adenopathy. PULMONARY: Good air entry bilaterally.  No adventitious sounds. CARDIOVASCULAR: S1 and S2. Regular rate and rhythm.  ABDOMEN: MUSCULOSKELETAL: No joint deformity, no clubbing, no edema.  NEUROLOGIC:   SKIN: Intact,warm,dry. PSYCH:        Assessment & Plan:   No diagnosis found.  No orders of the defined types were placed in this encounter.   No orders of the defined types were placed in this encounter.     Advised if symptoms do not improve or worsen, to please contact office for sooner follow up or seek emergency care.    I spent xxx minutes of dedicated to the care of this patient on the date of this encounter to include pre-visit review of records, face-to-face time with the patient discussing conditions above, post visit ordering of testing, clinical documentation with the electronic health record, making appropriate referrals as documented, and communicating necessary findings to members of the patients care team.     C. Leita Sanders, MD Advanced Bronchoscopy PCCM [facility]    *This note was generated using voice recognition software/Dragon and/or AI transcription program.  Despite best efforts to proofread, errors can occur which can change the meaning. Any transcriptional errors that result from this process are unintentional and may not be fully  corrected at the time of dictation.

## 2024-07-25 NOTE — Patient Instructions (Signed)
 VISIT SUMMARY:  You came in today for a follow-up on your COPD and sleep apnea management. We also discussed your history of lung cancer and your recent surgery.  YOUR PLAN:  -CHRONIC OBSTRUCTIVE PULMONARY DISEASE (COPD) MANAGEMENT: COPD is a chronic lung condition that makes it hard to breathe. Your COPD is well-managed with Advair , but we are looking for a cheaper alternative due to the cost. We have ensured you have refills for Advair  and encouraged you to follow up with your insurance company regarding medication costs.  -OBSTRUCTIVE SLEEP APNEA, STABLE ON CPAP THERAPY: Obstructive sleep apnea is a condition where your breathing stops and starts during sleep. Your sleep apnea is well-managed with CPAP therapy, and the new mask is working well for you. Continue using your CPAP machine, and we will check your compliance at the next visit to ensure insurance coverage.  -HISTORY OF SQUAMOUS CELL CARCINOMA OF RIGHT LUNG, POST-LOBECTOMY, SURVEILLANCE PLANNING: You have a history of lung cancer and have had surgery to remove it. We will continue to monitor your condition with a CT scan of the chest planned for February. We will schedule a follow-up appointment after the CT scan to discuss the results.  INSTRUCTIONS:  Please follow up with your insurance company regarding the cost of Advair .  Your prescriptions are up-to-date.  Continue using your CPAP machine as directed, and we will check your compliance at the next visit. We will schedule a follow-up appointment in February after your CT scan to monitor your lung cancer status.

## 2024-07-26 ENCOUNTER — Other Ambulatory Visit: Payer: Self-pay

## 2024-08-02 ENCOUNTER — Other Ambulatory Visit: Payer: Self-pay | Admitting: Internal Medicine

## 2024-08-02 DIAGNOSIS — F411 Generalized anxiety disorder: Secondary | ICD-10-CM

## 2024-08-07 ENCOUNTER — Other Ambulatory Visit: Payer: Self-pay | Admitting: Physician Assistant

## 2024-08-26 ENCOUNTER — Encounter: Payer: Self-pay | Admitting: Physician Assistant

## 2024-08-26 ENCOUNTER — Ambulatory Visit (INDEPENDENT_AMBULATORY_CARE_PROVIDER_SITE_OTHER): Payer: Medicare HMO | Admitting: Physician Assistant

## 2024-08-26 VITALS — BP 110/75 | HR 76 | Temp 98.0°F | Resp 16 | Ht 60.0 in | Wt 138.6 lb

## 2024-08-26 DIAGNOSIS — E041 Nontoxic single thyroid nodule: Secondary | ICD-10-CM

## 2024-08-26 DIAGNOSIS — E559 Vitamin D deficiency, unspecified: Secondary | ICD-10-CM

## 2024-08-26 DIAGNOSIS — R5383 Other fatigue: Secondary | ICD-10-CM

## 2024-08-26 DIAGNOSIS — Z0001 Encounter for general adult medical examination with abnormal findings: Secondary | ICD-10-CM

## 2024-08-26 DIAGNOSIS — E538 Deficiency of other specified B group vitamins: Secondary | ICD-10-CM

## 2024-08-26 DIAGNOSIS — E782 Mixed hyperlipidemia: Secondary | ICD-10-CM

## 2024-08-26 DIAGNOSIS — M5412 Radiculopathy, cervical region: Secondary | ICD-10-CM

## 2024-08-26 MED ORDER — ROSUVASTATIN CALCIUM 40 MG PO TABS: 40.0000 mg | ORAL_TABLET | Freq: Every evening | ORAL | 2 refills | Status: AC

## 2024-08-26 NOTE — Progress Notes (Signed)
 [facility] [address] [place], [place] [address]  Internal MEDICINE  Office Visit Note  Patient Name: Michelle Moore  [id number]  [id number]  Date of Service: 08/26/2024  Chief Complaint  Patient presents with   Medicare Wellness   Hyperlipidemia   Hypertension   Quality Metric Gaps    TDAP    HPI Deaven presents for an annual well visit Well-appearing 68 y.o.female Routine CRC screening: UTD Routine mammogram: UTD DEXA scan: UTD Labs: ordered New or worsening pain: seeing rheumatology tomorrow; pinched never in Neck and seeing Dr. Mavis in Jan for a procedure Other concerns: still thinking about shingles shot -did see endo and has large nodule but not cancerous, told they could take it out if swallowing still impacted, but will have US  and follow up in a few months with them. She thinks it could be medication related. Swallow trouble seems to be intermittent     08/26/2024   11:03 AM 08/25/2023    9:21 AM 07/25/2022    8:52 AM  MMSE - Mini Mental State Exam  Orientation to time 5 5 3   Orientation to Place 5 5 5   Registration 3 3 2   Attention/ Calculation 5 5 5   Recall 3 3 3   Language- name 2 objects 2 2 2   Language- repeat 1 1 1   Language- follow 3 step command 3 3 3   Language- read & follow direction 1 1 1   Write a sentence 1 1 1   Copy design 1 1 1   Total score 30 30 27     Functional Status Survey: Is the patient deaf or have difficulty hearing?: No Does the patient have difficulty seeing, even when wearing glasses/contacts?: No Does the patient have difficulty concentrating, remembering, or making decisions?: No Does the patient have difficulty walking or climbing stairs?: No Does the patient have difficulty dressing or bathing?: No Does the patient have difficulty doing errands alone such as visiting a doctor's office or shopping?: No     12/29/2022   11:16 AM 03/09/2023   11:06 AM 08/25/2023    9:21 AM 02/22/2024    9:11 AM 08/26/2024    11:02 AM  Fall Risk  Falls in the past year? 0 0 0 0 0  Patient at Risk for Falls Due to No Fall Risks   No Fall Risks   Fall risk Follow up    Falls evaluation completed        08/26/2024   11:03 AM  Depression screen PHQ 2/9  Down, Depressed, Hopeless 0  PHQ - 2 Score 0        No data to display            Current Medication: Outpatient Encounter Medications as of 08/26/2024  Medication Sig   albuterol  (PROVENTIL ) (2.5 MG/3ML) 0.083% nebulizer solution Take 3 mLs (2.5 mg total) by nebulization every 6 (six) hours as needed for wheezing or shortness of breath.   albuterol  (VENTOLIN  HFA) 108 (90 Base) MCG/ACT inhaler Inhale 1-2 puffs into the lungs every 6 (six) hours as needed for wheezing or shortness of breath.   alendronate  (FOSAMAX ) 70 MG tablet TAKE ONE (1) TABLET BY MOUTH ONCE A WEEK. TAKE WITH A FULL GLASS OF WATER  ON AN EMPTY STOMACH. STAND OR SIT UPRIGHT FOR ONE (1) HOUR AFTER TAKING.   ALPRAZolam  (XANAX ) 0.25 MG tablet TAKE ONE (1) TO TWO (2) TABLETS BY MOUTH EVERY NIGHT   aspirin-acetaminophen -caffeine (EXCEDRIN MIGRAINE) 250-250-65 MG tablet Take by mouth every 6 (six)  hours as needed for headache.   cyclobenzaprine  (FLEXERIL ) 10 MG tablet TAKE ONE (1) TABLET (10 MG TOTAL) BY MOUTH AT BEDTIME AS NEEDED FOR MUSCLE SPASMS.   fluticasone  (FLONASE ) 50 MCG/ACT nasal spray Place 2 sprays into both nostrils daily as needed for allergies.   fluticasone -salmeterol (ADVAIR) 250-50 MCG/ACT AEPB INHALE 1 PUFF TWICE DAILY IN THE MORNING AND AT BEDTIME   folic acid  (FOLVITE ) 1 MG tablet Take 2 mg by mouth in the morning.   gabapentin  (NEURONTIN ) 100 MG capsule Take 100 mg by mouth 3 (three) times daily.   hydroxychloroquine (PLAQUENIL) 200 MG tablet Take 200 mg by mouth daily.   loratadine  (CLARITIN ) 10 MG tablet Take 10 mg by mouth daily.   methotrexate (RHEUMATREX) 2.5 MG tablet Take 20 mg by mouth once a week. WEDNESDAY   montelukast  (SINGULAIR ) 10 MG tablet TAKE (1)  TABLET BY MOUTH AT BEDTIME   omeprazole  (PRILOSEC) 40 MG capsule TAKE (1) CAPSULE BY MOUTH EVERY DAY   potassium chloride  (KLOR-CON ) 10 MEQ tablet TAKE (1) TABLET BY MOUTH EVERY DAY   sertraline  (ZOLOFT ) 100 MG tablet TAKE (1) TABLET BY MOUTH EVERY DAY   traMADol  (ULTRAM ) 50 MG tablet Take 1-2 tablets (50-100 mg total) by mouth 3 (three) times daily as needed.   [DISCONTINUED] rosuvastatin  (CRESTOR ) 40 MG tablet Take 40 mg by mouth every evening.   rosuvastatin  (CRESTOR ) 40 MG tablet Take 1 tablet (40 mg total) by mouth every evening.   No facility-administered encounter medications on file as of 08/26/2024.    Surgical History: Past Surgical History:  Procedure Laterality Date   ABDOMINAL HYSTERECTOMY     ANTERIOR CERVICAL DECOMP/DISCECTOMY FUSION N/A 01/20/2014   Procedure: ANTERIOR CERVICAL DECOMPRESSION/DISCECTOMY FUSION 2 LEVELS cervical five/six six roxan;  Surgeon: Reyes JONETTA Budge, MD;  Location: [facility];  Service: Neurosurgery;  Laterality: N/A;   APPENDECTOMY     BACK SURGERY     lumbar   BLADDER INSTILLATION N/A 05/02/2022   Procedure: BLADDER INSTILLATION OF GEMCITABINE ;  Surgeon: Penne Knee, MD;  Location: [facility];  Service: Urology;  Laterality: N/A;   BREAST BIOPSY Right 02/01/2018   x shape, DCIS    BREAST BIOPSY  01/16/2019   coil, BENIGN MAMMARY TISSUE WITH MODERATE STROMAL FIBROSIS AND COARSE DYSTROPHIC CALCIFICATIONS of the RIGHT breast   BREAST LUMPECTOMY Right 03/07/2018   Procedure: BREAST LUMPECTOMY/ RE EXCISION;  Surgeon: Rodolph Romano, MD;  Location: [facility];  Service: General;  Laterality: Right;   CARDIAC CATHETERIZATION Left 07/31/2014   Procedure: CARDIAC CATHETERIZATION; Location: [facility]; Surgeon: Wolm Rhyme, MD   COLONOSCOPY WITH PROPOFOL  N/A 02/11/2021   Procedure: COLONOSCOPY WITH PROPOFOL ;  Surgeon: Jinny Carmine, MD;  Location: [facility];  Service: Endoscopy;  Laterality: N/A;  Requests Early   CORONARY ANGIOPLASTY  WITH STENT PLACEMENT Left 10/16/2009   Procedure: CORONARY ANGIOPLASTY WITH STENT PLACEMENT; Location: [facility]; Surgeon: Margie Lovelace, MD   CYSTOSCOPY W/ RETROGRADES Bilateral 05/02/2022   Procedure: CYSTOSCOPY WITH RETROGRADE PYELOGRAM;  Surgeon: Penne Knee, MD;  Location: [facility];  Service: Urology;  Laterality: Bilateral;   ENDOBRONCHIAL ULTRASOUND Right 07/07/2023   Procedure: ENDOBRONCHIAL ULTRASOUND;  Surgeon: Tamea Dedra CROME, MD;  Location: [facility];  Service: Pulmonary;  Laterality: Right;   ESOPHAGOGASTRODUODENOSCOPY (EGD) WITH PROPOFOL  N/A 10/07/2022   Procedure: ESOPHAGOGASTRODUODENOSCOPY (EGD) WITH PROPOFOL ;  Surgeon: Jinny Carmine, MD;  Location: [facility];  Service: Endoscopy;  Laterality: N/A;  sleep apnea   FLEXIBLE BRONCHOSCOPY Right 07/07/2023   Procedure: FLEXIBLE BRONCHOSCOPY;  Surgeon: Tamea,  Dedra CROME, MD;  Location: [facility];  Service: Pulmonary;  Laterality: Right;   INTERCOSTAL NERVE BLOCK Right 03/17/2022   Procedure: INTERCOSTAL NERVE BLOCK;  Surgeon: Kerrin Elspeth BROCKS, MD;  Location: [facility] OR;  Service: Thoracic;  Laterality: Right;   LASIK Bilateral    LUNG LOBECTOMY (RIGHT UPPER LOBE) Right 03/17/2022   LYMPH NODE DISSECTION Right 03/17/2022   Procedure: LYMPH NODE DISSECTION;  Surgeon: Kerrin Elspeth BROCKS, MD;  Location: [facility] OR;  Service: Thoracic;  Laterality: Right;   PARTIAL MASTECTOMY WITH NEEDLE LOCALIZATION Right 02/21/2018   Procedure: PARTIAL MASTECTOMY WITH NEEDLE LOCALIZATION;  Surgeon: Rodolph Romano, MD;  Location: [facility];  Service: General;  Laterality: Right;   POLYPECTOMY N/A 02/11/2021   Procedure: POLYPECTOMY;  Surgeon: Jinny Carmine, MD;  Location: [facility];  Service: Endoscopy;  Laterality: N/A;   ROTATOR CUFF REPAIR Left    SHOULDER ARTHROSCOPY WITH ROTATOR CUFF REPAIR AND OPEN BICEPS TENODESIS Right 08/07/2019   Procedure: SHOULDER ARTHROSCOPY WITH DEBRIDEMENT, DECOMPRESSION, ROTATOR CUFF REPAIR AND  BICEPS TENODESIS. - RNFA;  Surgeon: Edie Norleen PARAS, MD;  Location: [facility];  Service: Orthopedics;  Laterality: Right;   TOE SURGERY Right    has pin and plate in it   TRANSURETHRAL RESECTION OF BLADDER TUMOR N/A 05/02/2022   Procedure: TRANSURETHRAL RESECTION OF BLADDER TUMOR (TURBT);  Surgeon: Penne Knee, MD;  Location: [facility];  Service: Urology;  Laterality: N/A;   VIDEO BRONCHOSCOPY WITH ENDOBRONCHIAL ULTRASOUND N/A 03/02/2022   Procedure: VIDEO BRONCHOSCOPY WITH ENDOBRONCHIAL ULTRASOUND;  Surgeon: Tamea Dedra CROME, MD;  Location: [facility];  Service: Cardiopulmonary;  Laterality: N/A;    Medical History: Past Medical History:  Diagnosis Date   Adenoma of right adrenal gland 12/07/2021   a.) CT chest 12/07/2021; measured 2.3 cm; not FDG avid on 01/06/2022 PET CT.   Anxiety    a.) uses BZO (alprazolam ) PRN   Aortic atherosclerosis    Arthritis    Bilateral carotid artery stenosis    Bladder mass 12/07/2021   a.) CT abd 12/07/2021 --> 9 x 8 mm nodular enhancing focus at the base of the bladder; suspicious for urothelial neoplasm; b.) s/p TURBT 05/02/2022 --> pathology (-) for high grade dysplasia/malignancy   Cervical spondylosis without myelopathy    a.) s/p ACDF C5-C7   COPD (chronic obstructive pulmonary disease) (HCC)    Coronary artery disease 10/16/2009   a.) LHC/PCI 10/16/2009: EF 60%; 50% OM1, 99% dRCA (3.0 x 18 mm Xience V DES). b.) LHC 07/31/2014: EF 65%; 40% pLAD, 20% pLCx, 40% OM1, 30% oRCA, 50% pRCA, 20% mRCA, 10% ISR stent to dRCA; further intervention deferred opting for med. mgmt.   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 b.) s/p lumpectomy, adjuvant XRT; currently on extended AI (anastrozole ) therapy   Essential hypertension    GERD (gastroesophageal reflux disease)    Hepatic  steatosis    History of 2019 novel coronavirus disease (COVID-19) 04/2023   HLD (hyperlipidemia)    Left thyroid  nodule 12/07/2021   a.) CT chest 12/07/2021: 2.6 cm; b.) CT chest 06/05/2023: hypoattenuating --> 2.5 cm   Long term current use of immunosuppressive drug    a.) MTX for RA.   OSA on CPAP    currently not using   Osteopenia    Personal history of radiation  therapy    Pneumonia    PRES (posterior reversible encephalopathy syndrome) 07/14/2014   Pulmonary nodules 11/08/2021   a.) CXR 11/08/2021: 18mm nodular density RUL. b.) CT chest 12/07/2021: 1.9 x 1.4 x 2.5 spiculated lesion anterior RUL. c.) PET CT 01/06/2022: 2.2 x 1.9 spiculated anterior RUL mass (SUV max 16.4); d.) Bx (+) NSCLC, favoring SCC with tumor necrosis; p40/CK7/GATA3 (+), TTF-1 (-)   Rheumatoid arthritis (HCC)    a.) on MTX   Seasonal allergies    Seizures (HCC) 2014   a.) x 1 episode; etiology never determined.   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   Tubular adenoma     Family History: Family History  Problem Relation Age of Onset   Breast cancer Cousin 46       bilateral at 26 and 15; daughter of maternal aunt who was unaffected   Lung cancer Maternal Aunt        dx 75s; deceased 22s; smoker   Heart attack Father        deceased 12   Stomach cancer Maternal Grandmother     Social History   Socioeconomic History   Marital status: Married    Spouse name: Jimmy   Number of children: 1   Years of education: Not on file   Highest education level: Not on file  Occupational History   Not on file  Tobacco Use   Smoking status: Former    Current packs/day:  0.00    Average packs/day: 1 pack/day for 30.0 years (30.0 ttl pk-yrs)    Types: Cigarettes    Start date: 09/12/1985    Quit date: 09/13/2015    Years since quitting: 8.9   Smokeless tobacco: Never  Vaping Use   Vaping status: Never Used  Substance and Sexual Activity   Alcohol use: Yes    Comment: occasional   Drug use: No   Sexual activity: Not on file  Other Topics Concern   Not on file  Social History Narrative   Not on file   Social Drivers of Health   Tobacco Use: Medium Risk (08/26/2024)   Patient History    Smoking Tobacco Use: Former    Smokeless Tobacco Use: Never    Passive Exposure: Not on Actuary Strain: Low Risk  (04/18/2024)   Received from [facility]   Overall Financial Resource Strain (CARDIA)    Difficulty of Paying Living Expenses: Not hard at all  Food Insecurity: No Food Insecurity (04/18/2024)   Received from [facility]   Epic    Within the past 12 months, you worried that your food would run out before you got the money to buy more.: Never true    Within the past 12 months, the food you bought just didn't last and you didn't have money to get more.: Never true  Transportation Needs: No Transportation Needs (04/18/2024)   Received from [facility] - Transportation    In the past 12 months, has lack of transportation kept you from medical appointments or from getting medications?: No  Lack of Transportation (Non-Medical): No  Physical Activity: Inactive (03/31/2022)   Exercise Vital Sign    Days of Exercise per Week: 0 days    Minutes of Exercise per Session: 0 min  Stress: No Stress Concern Present (03/31/2022)   [employer] of [employer]ational [employer] - Occupational Stress Questionnaire    Feeling of Stress : Only a little  Social Connections: Socially Integrated (03/31/2022)   Social Connection and Isolation Panel    Frequency of Communication with Friends and Family:  Three times a week    Frequency of Social Gatherings with Friends and Family: Three times a week    Attends Religious Services: More than 4 times per year    Active Member of Clubs or Organizations: Yes    Attends Banker Meetings: More than 4 times per year    Marital Status: Married  Catering Manager Violence: Not At Risk (11/08/2023)   Humiliation, Afraid, Rape, and Kick questionnaire    Fear of Current or Ex-Partner: No    Emotionally Abused: No    Physically Abused: No    Sexually Abused: No  Depression (PHQ2-9): Low Risk (08/26/2024)   Depression (PHQ2-9)    PHQ-2 Score: 0  Alcohol Screen: Low Risk (08/25/2023)   Alcohol Screen    Last Alcohol Screening Score (AUDIT): 2  Housing: Low Risk  (04/18/2024)   Received from [facility]   Epic    In the last 12 months, was there a time when you were not able to pay the mortgage or rent on time?: No    In the past 12 months, how many times have you moved where you were living?: 0    At any time in the past 12 months, were you homeless or living in a shelter (including now)?: No  Utilities: Not At Risk (04/18/2024)   Received from [facility]   Epic    In the past 12 months has the electric, gas, oil, or water  company threatened to shut off services in your home?: No  Health Literacy: Not on file      Review of Systems  Constitutional:  Positive for fatigue. Negative for chills and unexpected weight change.  HENT:  Positive for trouble swallowing. Negative for congestion, rhinorrhea, sneezing and sore throat.   Eyes:  Negative for redness.  Respiratory:  Negative for cough and chest tightness.   Cardiovascular:  Negative for chest pain and palpitations.  Gastrointestinal:  Negative for abdominal pain, constipation, diarrhea, nausea and vomiting.  Genitourinary:  Negative for dysuria and frequency.  Musculoskeletal:  Positive for arthralgias. Negative for back pain, joint swelling and  neck pain.  Skin:  Negative for rash.  Neurological: Negative.  Negative for tremors and numbness.  Hematological:  Negative for adenopathy. Does not bruise/bleed easily.  Psychiatric/Behavioral:  Negative for behavioral problems (Depression), sleep disturbance and suicidal ideas. The patient is not nervous/anxious.     Vital Signs: BP 110/75   Pulse 76   Temp 98 F (36.7 C)   Resp 16   Ht 5' (1.524 m)   Wt 138 lb 9.6 oz (62.9 kg)   SpO2 98%   BMI 27.07 kg/m    Physical Exam Vitals and nursing note reviewed.  Constitutional:      Appearance: Normal appearance.  HENT:     Head: Normocephalic and atraumatic.     Right Ear: Tympanic membrane normal.     Left Ear: Tympanic membrane normal.  Eyes:  Extraocular Movements: Extraocular movements intact.  Cardiovascular:     Rate and Rhythm: Normal rate and regular rhythm.  Pulmonary:     Effort: Pulmonary effort is normal.     Breath sounds: Normal breath sounds.  Skin:    General: Skin is warm and dry.  Neurological:     Mental Status: She is alert and oriented to person, place, and time.  Psychiatric:        Mood and Affect: Mood normal.        Assessment/Plan: 1. Encounter for Medicare annual examination with abnormal findings (Primary) AWV performed, labs ordered  2. Cervical radiculitis Followed by neurosurgery with plan for procedure in Jan due to pinched nerve  3. B12 deficiency - B12 and Folate Panel  4. Vitamin D  deficiency - VITAMIN D  25 Hydroxy (Vit-D Deficiency, Fractures)  5. Thyroid  nodule Will monitor labs - TSH + free T4  6. Mixed hyperlipidemia Continue crestor  and will update labs - Lipid Panel With LDL/HDL Ratio  7. Other fatigue - TSH + free T4 - Lipid Panel With LDL/HDL Ratio - B12 and Folate Panel - VITAMIN D  25 Hydroxy (Vit-D Deficiency, Fractures) - Comprehensive metabolic panel with GFR     General Counseling: Nathanel verbalizes understanding of the findings of todays  visit and agrees with plan of treatment. I have discussed any further diagnostic evaluation that may be needed or ordered today. We also reviewed her medications today. she has been encouraged to call the office with any questions or concerns that should arise related to todays visit.    Orders Placed This Encounter  Procedures   TSH + free T4   Lipid Panel With LDL/HDL Ratio   B12 and Folate Panel   VITAMIN D  25 Hydroxy (Vit-D Deficiency, Fractures)   Comprehensive metabolic panel with GFR    Meds ordered this encounter  Medications   rosuvastatin  (CRESTOR ) 40 MG tablet    Sig: Take 1 tablet (40 mg total) by mouth every evening.    Dispense:  90 tablet    Refill:  2    Return in about 6 months (around 02/24/2025) for general follow up.   Total time spent:30 Minutes Time spent includes review of chart, medications, test results, and follow up plan with the patient.   [place] Controlled Substance Database was reviewed by me.  This patient was seen by Tinnie Pro, PA-C in collaboration with Dr. Sigrid Bathe as a part of collaborative care agreement.  Tinnie Pro, PA-C Internal medicine

## 2024-08-27 ENCOUNTER — Other Ambulatory Visit: Payer: Self-pay

## 2024-09-12 ENCOUNTER — Encounter: Payer: Self-pay | Admitting: Oncology

## 2024-09-12 LAB — LIPID PANEL WITH LDL/HDL RATIO
Cholesterol, Total: 169 mg/dL (ref 100–199)
HDL: 70 mg/dL
LDL Chol Calc (NIH): 80 mg/dL (ref 0–99)
LDL/HDL Ratio: 1.1 ratio (ref 0.0–3.2)
Triglycerides: 106 mg/dL (ref 0–149)
VLDL Cholesterol Cal: 19 mg/dL (ref 5–40)

## 2024-09-12 LAB — COMPREHENSIVE METABOLIC PANEL WITH GFR
ALT: 20 IU/L (ref 0–32)
AST: 23 IU/L (ref 0–40)
Albumin: 4 g/dL (ref 3.9–4.9)
Alkaline Phosphatase: 87 IU/L (ref 49–135)
BUN/Creatinine Ratio: 19 (ref 12–28)
BUN: 14 mg/dL (ref 8–27)
Bilirubin Total: <0.2 mg/dL (ref 0.0–1.2)
CO2: 24 mmol/L (ref 20–29)
Calcium: 8.5 mg/dL — ABNORMAL LOW (ref 8.7–10.3)
Chloride: 105 mmol/L (ref 96–106)
Creatinine, Ser: 0.75 mg/dL (ref 0.57–1.00)
Globulin, Total: 2 g/dL (ref 1.5–4.5)
Glucose: 110 mg/dL — ABNORMAL HIGH (ref 70–99)
Potassium: 3.4 mmol/L — ABNORMAL LOW (ref 3.5–5.2)
Sodium: 143 mmol/L (ref 134–144)
Total Protein: 6 g/dL (ref 6.0–8.5)
eGFR: 87 mL/min/1.73

## 2024-09-12 LAB — VITAMIN D 25 HYDROXY (VIT D DEFICIENCY, FRACTURES): Vit D, 25-Hydroxy: 18.8 ng/mL — ABNORMAL LOW (ref 30.0–100.0)

## 2024-09-12 LAB — TSH+FREE T4
Free T4: 0.85 ng/dL (ref 0.82–1.77)
TSH: 2.14 u[IU]/mL (ref 0.450–4.500)

## 2024-09-12 LAB — B12 AND FOLATE PANEL
Folate: >20 ng/mL
Vitamin B-12: 330 pg/mL (ref 232–1245)

## 2024-09-13 ENCOUNTER — Ambulatory Visit: Payer: Self-pay | Admitting: Physician Assistant

## 2024-09-16 ENCOUNTER — Other Ambulatory Visit: Payer: Self-pay

## 2024-09-16 MED ORDER — ERGOCALCIFEROL 1.25 MG (50000 UT) PO CAPS: 50000.0000 [IU] | ORAL_CAPSULE | ORAL | 3 refills | Status: AC

## 2024-09-16 NOTE — Telephone Encounter (Signed)
 Pt notified, drisdol  sent per Lauren.

## 2024-09-16 NOTE — Telephone Encounter (Signed)
-----   Message from Tinnie MARLA Pro sent at 09/13/2024 12:06 PM EST ----- Vit D very low and can send drisdol , potassium and calcium  a little low and will need to monitor. Increase hydration with some electrolytes. Still taking her potassium supplement right? B12 low and  can supplement or consider shots.

## 2024-09-18 NOTE — Progress Notes (Unsigned)
" ° °  09/23/2024 2:52 PM   Michelle Moore 10-07-1955 [id number]  Cystoscopy Procedure Note:  Indication:   TURBT + gem (2023) - negative  - had 1 cm bladder mass previously- ?resolution after cis/tax chemo for breast Ca Cysto (Jan 2025) - NED  After informed consent and discussion of the procedure and its risks, Michelle Moore was positioned and prepped in the standard fashion. Cystoscopy was performed with a flexible cystoscope. The external vaginal anatomy, urethra, bladder neck and bladder mucosa were visualized in a systematic fashion. The ureteral orifices were noted in orthotopic location and orientation. There were no bladder mucosal lesions, stones, debris or anatomic variants noted.   Imaging: Recent {bglistimagingtype:33376} reviewed - no GU abnormalities noted  Findings: ***  Assessment and Plan: ***  Michelle Skye, MD 09/18/2024   "

## 2024-09-23 ENCOUNTER — Other Ambulatory Visit: Admitting: Urology

## 2024-09-24 ENCOUNTER — Other Ambulatory Visit: Payer: Self-pay | Admitting: Urology

## 2024-10-02 NOTE — Progress Notes (Unsigned)
" ° °  10/07/2024 4:09 PM   Michelle Moore 1955-12-19 [id number]  Cystoscopy Procedure Note:  Indication:   TURBT + gem (2023) - negative  - had 1 cm bladder mass previously- ?resolution after cis/tax chemo for breast Ca Cysto (Jan 2025) - NED  After informed consent and discussion of the procedure and its risks, Michelle Moore was positioned and prepped in the standard fashion. Cystoscopy was performed with a flexible cystoscope. The external vaginal anatomy, urethra, bladder neck and bladder mucosa were visualized in a systematic fashion. The ureteral orifices were noted in orthotopic location and orientation. There were no bladder mucosal lesions, stones, debris or anatomic variants noted.   Imaging: Recent {bglistimagingtype:33376} reviewed - no GU abnormalities noted  Findings: ***  Assessment and Plan: ***  Michelle Skye, MD 10/02/2024   "

## 2024-10-07 ENCOUNTER — Other Ambulatory Visit: Admitting: Urology

## 2024-10-13 ENCOUNTER — Encounter: Payer: Self-pay | Admitting: Pulmonary Disease

## 2024-10-16 NOTE — Progress Notes (Unsigned)
" ° °  10/18/2024 9:11 AM   LAI HENDRIKS 07/06/1956 [id number]  Cystoscopy Procedure Note:  Indication:   TURBT + gem (2023) - negative  - had 1 cm bladder mass previously- ?resolution after cis/tax chemo for breast Ca Cysto (Jan 2025) - NED  After informed consent and discussion of the procedure and its risks, Michelle Moore was positioned and prepped in the standard fashion. Cystoscopy was performed with a flexible cystoscope. The external vaginal anatomy, urethra, bladder neck and bladder mucosa were visualized in a systematic fashion. The ureteral orifices were noted in orthotopic location and orientation. There were no bladder mucosal lesions, stones, debris or anatomic variants noted.   Imaging: Recent {bglistimagingtype:33376} reviewed - no GU abnormalities noted  Findings: ***  Assessment and Plan: ***  Penne Skye, MD 10/16/2024   "

## 2024-10-17 NOTE — Telephone Encounter (Signed)
 Try to see if Dulera  is cheaper maybe we can do a test claim through pharmacy team.

## 2024-10-18 ENCOUNTER — Encounter: Payer: Self-pay | Admitting: Urology

## 2024-10-18 ENCOUNTER — Ambulatory Visit: Admitting: Urology

## 2024-10-18 VITALS — BP 133/77 | HR 90 | Ht 60.0 in | Wt 138.4 lb

## 2024-10-18 DIAGNOSIS — R8290 Unspecified abnormal findings in urine: Secondary | ICD-10-CM | POA: Diagnosis not present

## 2024-10-18 LAB — URINALYSIS, COMPLETE
Bilirubin, UA: NEGATIVE
Glucose, UA: NEGATIVE
Ketones, UA: NEGATIVE
Nitrite, UA: NEGATIVE
Protein,UA: NEGATIVE
Specific Gravity, UA: <1.005 — ABNORMAL LOW (ref 1.005–1.030)
Urobilinogen, Ur: 0.2 mg/dL (ref 0.2–1.0)
pH, UA: 6 (ref 5.0–7.5)

## 2024-10-18 LAB — MICROSCOPIC EXAMINATION

## 2024-10-18 NOTE — Patient Instructions (Signed)
 Please call [phone] to schedule your imaging prior to your appointment. Please allow time for your imaging results as this can take up 2-3 weeks to review. A follow up appointment as already been scheduled for the doctor to review results with you.

## 2024-10-18 NOTE — Addendum Note (Signed)
 Addended by: Annalisia Ingber E on: 10/18/2024 11:14 AM   Modules accepted: Orders

## 2024-11-06 ENCOUNTER — Other Ambulatory Visit

## 2024-11-20 ENCOUNTER — Ambulatory Visit: Admitting: Oncology

## 2024-11-22 ENCOUNTER — Ambulatory Visit: Admitting: Pulmonary Disease

## 2024-12-20 ENCOUNTER — Ambulatory Visit: Admitting: Urology

## 2025-02-24 ENCOUNTER — Ambulatory Visit: Admitting: Physician Assistant

## 2025-08-28 ENCOUNTER — Ambulatory Visit: Admitting: Physician Assistant
